# Patient Record
Sex: Female | Born: 1950
Health system: Southern US, Community
[De-identification: ages and names within clinical notes are randomized; demographics above are authoritative.]

## PROBLEM LIST (undated history)

## (undated) DIAGNOSIS — K219 Gastro-esophageal reflux disease without esophagitis: Secondary | ICD-10-CM

## (undated) DIAGNOSIS — S4291XA Fracture of right shoulder girdle, part unspecified, initial encounter for closed fracture: Secondary | ICD-10-CM

## (undated) DIAGNOSIS — J40 Bronchitis, not specified as acute or chronic: Secondary | ICD-10-CM

## (undated) DIAGNOSIS — Z87440 Personal history of urinary (tract) infections: Secondary | ICD-10-CM

## (undated) DIAGNOSIS — R112 Nausea with vomiting, unspecified: Secondary | ICD-10-CM

## (undated) DIAGNOSIS — I10 Essential (primary) hypertension: Secondary | ICD-10-CM

## (undated) DIAGNOSIS — E785 Hyperlipidemia, unspecified: Secondary | ICD-10-CM

## (undated) DIAGNOSIS — F40240 Claustrophobia: Secondary | ICD-10-CM

## (undated) DIAGNOSIS — H04123 Dry eye syndrome of bilateral lacrimal glands: Secondary | ICD-10-CM

## (undated) DIAGNOSIS — Z9889 Other specified postprocedural states: Secondary | ICD-10-CM

## (undated) DIAGNOSIS — R110 Nausea: Secondary | ICD-10-CM

## (undated) DIAGNOSIS — Z91048 Other nonmedicinal substance allergy status: Secondary | ICD-10-CM

## (undated) DIAGNOSIS — R011 Cardiac murmur, unspecified: Secondary | ICD-10-CM

## (undated) DIAGNOSIS — E039 Hypothyroidism, unspecified: Secondary | ICD-10-CM

## (undated) DIAGNOSIS — F32A Depression, unspecified: Secondary | ICD-10-CM

## (undated) DIAGNOSIS — R000 Tachycardia, unspecified: Secondary | ICD-10-CM

## (undated) DIAGNOSIS — F419 Anxiety disorder, unspecified: Secondary | ICD-10-CM

## (undated) DIAGNOSIS — M5136 Other intervertebral disc degeneration, lumbar region: Secondary | ICD-10-CM

## (undated) DIAGNOSIS — D649 Anemia, unspecified: Secondary | ICD-10-CM

## (undated) DIAGNOSIS — F329 Major depressive disorder, single episode, unspecified: Secondary | ICD-10-CM

## (undated) DIAGNOSIS — R319 Hematuria, unspecified: Secondary | ICD-10-CM

## (undated) DIAGNOSIS — J45909 Unspecified asthma, uncomplicated: Secondary | ICD-10-CM

## (undated) DIAGNOSIS — M3500 Sicca syndrome, unspecified: Secondary | ICD-10-CM

## (undated) DIAGNOSIS — M797 Fibromyalgia: Secondary | ICD-10-CM

## (undated) DIAGNOSIS — M810 Age-related osteoporosis without current pathological fracture: Secondary | ICD-10-CM

## (undated) DIAGNOSIS — G309 Alzheimer's disease, unspecified: Secondary | ICD-10-CM

## (undated) DIAGNOSIS — M1990 Unspecified osteoarthritis, unspecified site: Secondary | ICD-10-CM

## (undated) DIAGNOSIS — Z972 Presence of dental prosthetic device (complete) (partial): Secondary | ICD-10-CM

## (undated) DIAGNOSIS — T7840XA Allergy, unspecified, initial encounter: Secondary | ICD-10-CM

## (undated) DIAGNOSIS — R002 Palpitations: Secondary | ICD-10-CM

## (undated) DIAGNOSIS — G43909 Migraine, unspecified, not intractable, without status migrainosus: Secondary | ICD-10-CM

## (undated) DIAGNOSIS — F0280 Dementia in other diseases classified elsewhere without behavioral disturbance: Secondary | ICD-10-CM

## (undated) DIAGNOSIS — G4700 Insomnia, unspecified: Secondary | ICD-10-CM

## (undated) DIAGNOSIS — K589 Irritable bowel syndrome without diarrhea: Secondary | ICD-10-CM

## (undated) DIAGNOSIS — H269 Unspecified cataract: Secondary | ICD-10-CM

## (undated) DIAGNOSIS — I209 Angina pectoris, unspecified: Secondary | ICD-10-CM

## (undated) DIAGNOSIS — M51369 Other intervertebral disc degeneration, lumbar region without mention of lumbar back pain or lower extremity pain: Secondary | ICD-10-CM

## (undated) DIAGNOSIS — M5416 Radiculopathy, lumbar region: Secondary | ICD-10-CM

## (undated) DIAGNOSIS — G2581 Restless legs syndrome: Secondary | ICD-10-CM

## (undated) DIAGNOSIS — E079 Disorder of thyroid, unspecified: Secondary | ICD-10-CM

## (undated) DIAGNOSIS — E669 Obesity, unspecified: Secondary | ICD-10-CM

## (undated) HISTORY — PX: HEMORRHOID SURGERY: SHX153

## (undated) HISTORY — PX: KNEE ARTHROPLASTY: SHX992

## (undated) HISTORY — DX: Gastro-esophageal reflux disease without esophagitis: K21.9

## (undated) HISTORY — DX: Tachycardia, unspecified: R00.0

## (undated) HISTORY — DX: Alzheimer's disease, unspecified: G30.9

## (undated) HISTORY — DX: Unspecified cataract: H26.9

## (undated) HISTORY — DX: Anxiety disorder, unspecified: F41.9

## (undated) HISTORY — DX: Insomnia, unspecified: G47.00

## (undated) HISTORY — DX: Disorder of thyroid, unspecified: E07.9

## (undated) HISTORY — DX: Other intervertebral disc degeneration, lumbar region without mention of lumbar back pain or lower extremity pain: M51.369

## (undated) HISTORY — DX: Major depressive disorder, single episode, unspecified: F32.9

## (undated) HISTORY — DX: Hyperlipidemia, unspecified: E78.5

## (undated) HISTORY — PX: COLONOSCOPY W/ POLYPECTOMY: SHX1380

## (undated) HISTORY — DX: Obesity, unspecified: E66.9

## (undated) HISTORY — PX: SPINE SURGERY: SHX786

## (undated) HISTORY — DX: Bronchitis, not specified as acute or chronic: J40

## (undated) HISTORY — DX: Dementia in other diseases classified elsewhere without behavioral disturbance: F02.80

## (undated) HISTORY — PX: ABDOMINAL HYSTERECTOMY: SHX81

## (undated) HISTORY — DX: Palpitations: R00.2

## (undated) HISTORY — DX: Migraine, unspecified, not intractable, without status migrainosus: G43.909

## (undated) HISTORY — DX: Fibromyalgia: M79.7

## (undated) HISTORY — DX: Unspecified osteoarthritis, unspecified site: M19.90

## (undated) HISTORY — PX: JOINT REPLACEMENT: SHX530

## (undated) HISTORY — DX: Angina pectoris, unspecified: I20.9

## (undated) HISTORY — PX: HIP ARTHROPLASTY: SHX981

## (undated) HISTORY — DX: Irritable bowel syndrome, unspecified: K58.9

## (undated) HISTORY — PX: REPLACEMENT TOTAL KNEE: SUR1224

## (undated) HISTORY — DX: Restless legs syndrome: G25.81

## (undated) HISTORY — DX: Hematuria, unspecified: R31.9

## (undated) HISTORY — DX: Unspecified asthma, uncomplicated: J45.909

## (undated) HISTORY — DX: Allergy, unspecified, initial encounter: T78.40XA

## (undated) HISTORY — PX: CARDIAC CATHETERIZATION: SHX172

## (undated) HISTORY — DX: Other intervertebral disc degeneration, lumbar region: M51.36

## (undated) HISTORY — PX: OTHER SURGICAL HISTORY: SHX169

## (undated) HISTORY — DX: Radiculopathy, lumbar region: M54.16

## (undated) HISTORY — DX: Sjogren syndrome, unspecified: M35.00

## (undated) HISTORY — DX: Fracture of right shoulder girdle, part unspecified, initial encounter for closed fracture: S42.91XA

## (undated) HISTORY — DX: Age-related osteoporosis without current pathological fracture: M81.0

## (undated) HISTORY — DX: Nausea: R11.0

## (undated) HISTORY — DX: Depression, unspecified: F32.A

## (undated) HISTORY — PX: HAND SURGERY: SHX662

## (undated) HISTORY — DX: Cardiac murmur, unspecified: R01.1

---

## 2000-01-10 ENCOUNTER — Encounter: Payer: Self-pay | Admitting: Neurosurgery

## 2000-01-10 ENCOUNTER — Ambulatory Visit (HOSPITAL_COMMUNITY): Admission: RE | Admit: 2000-01-10 | Discharge: 2000-01-10 | Payer: Self-pay | Admitting: Neurosurgery

## 2000-03-16 ENCOUNTER — Encounter: Payer: Self-pay | Admitting: Neurosurgery

## 2000-03-20 ENCOUNTER — Encounter: Payer: Self-pay | Admitting: Neurosurgery

## 2000-03-20 ENCOUNTER — Inpatient Hospital Stay (HOSPITAL_COMMUNITY): Admission: RE | Admit: 2000-03-20 | Discharge: 2000-03-23 | Payer: Self-pay | Admitting: Neurosurgery

## 2000-04-19 ENCOUNTER — Encounter: Admission: RE | Admit: 2000-04-19 | Discharge: 2000-04-19 | Payer: Self-pay | Admitting: Neurosurgery

## 2000-04-19 ENCOUNTER — Encounter: Payer: Self-pay | Admitting: Neurosurgery

## 2003-12-31 ENCOUNTER — Ambulatory Visit: Payer: Self-pay | Admitting: Family Medicine

## 2004-01-08 ENCOUNTER — Ambulatory Visit: Payer: Self-pay | Admitting: Gastroenterology

## 2004-01-13 ENCOUNTER — Encounter (INDEPENDENT_AMBULATORY_CARE_PROVIDER_SITE_OTHER): Payer: Self-pay | Admitting: Specialist

## 2004-01-13 ENCOUNTER — Ambulatory Visit: Payer: Self-pay | Admitting: Gastroenterology

## 2004-01-13 ENCOUNTER — Ambulatory Visit (HOSPITAL_COMMUNITY): Admission: RE | Admit: 2004-01-13 | Discharge: 2004-01-13 | Payer: Self-pay | Admitting: Gastroenterology

## 2004-02-02 ENCOUNTER — Ambulatory Visit: Payer: Self-pay | Admitting: Gastroenterology

## 2004-06-15 ENCOUNTER — Ambulatory Visit: Payer: Self-pay

## 2004-11-28 ENCOUNTER — Ambulatory Visit: Payer: Self-pay | Admitting: Family Medicine

## 2004-11-29 ENCOUNTER — Ambulatory Visit: Payer: Self-pay | Admitting: Family Medicine

## 2005-02-11 ENCOUNTER — Other Ambulatory Visit: Payer: Self-pay

## 2005-02-11 ENCOUNTER — Emergency Department: Payer: Self-pay | Admitting: Emergency Medicine

## 2005-02-22 ENCOUNTER — Ambulatory Visit: Payer: Self-pay | Admitting: Specialist

## 2005-03-06 ENCOUNTER — Ambulatory Visit: Payer: Self-pay | Admitting: Specialist

## 2005-03-10 ENCOUNTER — Emergency Department: Payer: Self-pay | Admitting: General Practice

## 2005-03-28 ENCOUNTER — Ambulatory Visit: Payer: Self-pay | Admitting: Otolaryngology

## 2005-04-24 ENCOUNTER — Ambulatory Visit: Payer: Self-pay | Admitting: Gastroenterology

## 2005-04-26 ENCOUNTER — Ambulatory Visit: Payer: Self-pay | Admitting: Gastroenterology

## 2005-07-18 ENCOUNTER — Ambulatory Visit: Payer: Self-pay | Admitting: Gastroenterology

## 2005-10-13 ENCOUNTER — Ambulatory Visit: Payer: Self-pay | Admitting: Family Medicine

## 2005-11-02 ENCOUNTER — Ambulatory Visit: Payer: Self-pay | Admitting: Family Medicine

## 2005-12-26 ENCOUNTER — Ambulatory Visit: Payer: Self-pay | Admitting: Pain Medicine

## 2006-01-01 ENCOUNTER — Ambulatory Visit: Payer: Self-pay | Admitting: Pain Medicine

## 2006-01-23 ENCOUNTER — Ambulatory Visit: Payer: Self-pay | Admitting: Family Medicine

## 2006-03-27 ENCOUNTER — Encounter: Admission: RE | Admit: 2006-03-27 | Discharge: 2006-03-27 | Payer: Self-pay | Admitting: Neurosurgery

## 2006-09-07 ENCOUNTER — Encounter
Admission: RE | Admit: 2006-09-07 | Discharge: 2006-10-09 | Payer: Self-pay | Admitting: Physical Medicine & Rehabilitation

## 2006-09-10 ENCOUNTER — Ambulatory Visit: Payer: Self-pay | Admitting: Physical Medicine & Rehabilitation

## 2006-10-03 ENCOUNTER — Encounter: Payer: Self-pay | Admitting: Physical Medicine & Rehabilitation

## 2006-10-29 ENCOUNTER — Encounter: Payer: Self-pay | Admitting: Physical Medicine & Rehabilitation

## 2006-11-05 ENCOUNTER — Ambulatory Visit: Payer: Self-pay | Admitting: Family Medicine

## 2006-11-28 ENCOUNTER — Encounter: Payer: Self-pay | Admitting: Physical Medicine & Rehabilitation

## 2007-01-03 ENCOUNTER — Ambulatory Visit: Payer: Self-pay | Admitting: Gastroenterology

## 2007-02-19 ENCOUNTER — Ambulatory Visit: Payer: Self-pay | Admitting: Gastroenterology

## 2007-05-15 ENCOUNTER — Encounter: Admission: RE | Admit: 2007-05-15 | Discharge: 2007-05-15 | Payer: Self-pay | Admitting: Rheumatology

## 2007-07-04 ENCOUNTER — Emergency Department: Payer: Self-pay | Admitting: Emergency Medicine

## 2007-10-22 ENCOUNTER — Ambulatory Visit: Payer: Self-pay | Admitting: Family Medicine

## 2007-12-17 ENCOUNTER — Ambulatory Visit: Payer: Self-pay | Admitting: Family Medicine

## 2008-02-11 ENCOUNTER — Encounter: Admission: RE | Admit: 2008-02-11 | Discharge: 2008-02-11 | Payer: Self-pay | Admitting: Neurosurgery

## 2008-04-17 ENCOUNTER — Ambulatory Visit: Payer: Self-pay | Admitting: Family Medicine

## 2008-08-10 ENCOUNTER — Emergency Department: Payer: Self-pay | Admitting: Emergency Medicine

## 2008-12-23 ENCOUNTER — Ambulatory Visit: Payer: Self-pay | Admitting: Unknown Physician Specialty

## 2009-01-01 ENCOUNTER — Emergency Department: Payer: Self-pay | Admitting: Emergency Medicine

## 2009-01-19 ENCOUNTER — Ambulatory Visit: Payer: Self-pay | Admitting: Internal Medicine

## 2009-03-08 ENCOUNTER — Emergency Department: Payer: Self-pay | Admitting: Emergency Medicine

## 2009-03-10 ENCOUNTER — Ambulatory Visit: Payer: Self-pay

## 2009-03-30 ENCOUNTER — Ambulatory Visit: Payer: Self-pay | Admitting: General Practice

## 2009-04-07 ENCOUNTER — Ambulatory Visit: Payer: Self-pay | Admitting: General Practice

## 2009-04-29 ENCOUNTER — Ambulatory Visit: Payer: Self-pay

## 2009-05-09 ENCOUNTER — Ambulatory Visit: Payer: Self-pay

## 2009-06-30 ENCOUNTER — Ambulatory Visit: Payer: Self-pay | Admitting: Family Medicine

## 2009-07-06 ENCOUNTER — Inpatient Hospital Stay: Payer: Self-pay | Admitting: General Practice

## 2009-09-26 ENCOUNTER — Emergency Department: Payer: Self-pay | Admitting: Unknown Physician Specialty

## 2009-10-24 ENCOUNTER — Emergency Department: Payer: Self-pay | Admitting: Emergency Medicine

## 2010-01-12 ENCOUNTER — Emergency Department: Payer: Self-pay | Admitting: Emergency Medicine

## 2010-01-12 ENCOUNTER — Ambulatory Visit: Payer: Self-pay | Admitting: Unknown Physician Specialty

## 2010-01-22 ENCOUNTER — Ambulatory Visit: Payer: Self-pay

## 2010-02-04 ENCOUNTER — Ambulatory Visit: Payer: Self-pay | Admitting: General Practice

## 2010-03-20 ENCOUNTER — Encounter: Payer: Self-pay | Admitting: Neurosurgery

## 2010-07-12 NOTE — Assessment & Plan Note (Signed)
A 60 year old female, who I saw in initial consultation September 10, 2006.  She has previously undergone an L3-4, L4-5, and L5-S1 fusion.  She has a  history of shingles in the past, but her main complaint at this time is  her fibromyalgia syndrome.  She was given a trial of Lyrica last visit  but could not tolerate this.  She states that it made her feel funny, it  did not help her pain, and she almost felt like her pain was worse.  Her  pain is rated at a 7 out of 10 and is really described as all over her  body particularly her shoulders, ankles, forearms, and knees.  Her sleep  is poor.  For pain includes a TENS unit, which she wears on her shoulder  right now and medication.  She was placed back on Neurontin.  She  reports trying to do household duties but ends up overdoing this and  having some rebound-type pain.  She can walk 10 minutes at a time.  She  does not drive.   REVIEW OF SYSTEMS:  Numbness and tingling primarily in the lower  extremities, depression and anxiety, but negative for suicidal thoughts.  She has alternating diarrhea and constipation, nausea and vomiting.   SOCIAL HISTORY:  Married and lives with her spouse, who accompanies her  today.   PHYSICAL EXAMINATION:  VITAL SIGNS:  Blood pressure is 138/52, pulse 66,  respiratory rate is 18, O2 SAT is 98% room air.   PAIN AND REHAB EVALUATION:  Palpation for fibromyalgia tender points is  18 out of 18.  Lumbar range of motion is good in forward flexion, can  touch passed her knees but does not have much in terms of extension.  Her gait shows no obvious toe dragging or knee instability.  Mood and  affect are appropriate, but flat.   IMPRESSION:  Fibromyalgia syndrome.  She is sensitive to medications  overall, and we discussed other treatment options including acupuncture.  She states that she and her husband had discussed this and, in fact, her  insurance company reportedly has acupuncture benefits.  She would like  to have a trial treatment today, and we decided to do that and schedule  some additional acupuncture treatments once a week x6 weeks.   In terms of medications, she should continue on her Neurontin 300 mg  three times a day.   ADDENDUM:  Acupuncture treatment today consists of points placed at  bilateral LI-4 and right auricular point shen-men and DU-20.  Needles  placed for 10 minutes, no electrical stimulation was utilized.  We will  see her back for her next treatment given that she has tolerated this,  and use E-Stim in a longer treatment time up to 30 minutes.      Erick Colace, M.D.  Electronically Signed     AEK/MedQ  D:  10/09/2006 16:43:59  T:  10/10/2006 20:34:33  Job #:  [id number]

## 2010-07-12 NOTE — Group Therapy Note (Signed)
REQUESTING PHYSICIAN:  Kathryne Hitch, MD, for treatment of  degenerative disc disease lumbar spine.   A 61 year old female who has had back problems starting around 10 years  ago, who has previously undergone L3-4, L4-5, L5-S1 fusion.  In 2006,  she started experiencing increased knee pain, had right knee  arthroscopic surgery, but postoperatively experienced more generalized  pain and abdominal pain.  She turned out to have shingles which showed  up under her right breast and right arm pit area.  She had some  constipation after the shingles resolved, treated for H. pylori and  seeing a gastroenterologist in regards to this.  She has been treated  prior to her diagnosis of fibromyalgia with gabapentin and takes various  dosage but primarily 600 mg at bedtime and then either one 300 mg or one  100 mg at morning and noon time.  She complains that her pain is all  over her body and in fact, her pain diagram indicates pain over  bilateral shoulders, bilateral wrists, bilateral upper traps, bilateral  scapular areas, bilateral posterior arms, right hand, left buttock,  bilateral low back, bilateral knees and ankles.  Her average pain is  rated at 7-8/10, described as sharp, burning, intermittent, dull  stabbing, constant tingling, aching and interferes with general activity  at a moderate level, relationship with other people at a moderate level  and enjoyment of life at a moderate level.  Her pain is worse in the  morning and at night.  It is relatively better in the daytime and  evening hours.  Her sleep is fair.  Pain is worse with walking, bending,  sitting, standing, improves with TENS.  Relief from medications is fair.   She takes only occasional oxycodone and this is really more for her  migraine history.  She does follow with Dr. Mitzi Davenport.  She has taken  about 43 oxycodone in the last two months. She has taken 55 Fioricet  with Codeine since November.  She has had  rheumatology work-up and was  diagnosed with fibromyalgia.   OTHER PAST MEDICAL HISTORY:  1. Hypothyroidism, on Synthroid, last TSH was normal.  2. She has had normal liver function and kidney function tests in May      of this year.   OTHER MEDICATIONS TRIED:  She has been tried on Cymbalta which produced  excessively dry eyes.  She is taking Seroquel for sleep and for migraine  through Dr. Chriss Driver office.  Non-pain medications include Tricor,  Diovan, atenolol, Synthroid, Clarinex, Prevacid, Mirapex, alprazolam,  Amitiza,  Senokot, Reglan, promethazine, Restasis and Flonase.   Her average walking time is 5-10 minutes.  She can climb steps.  She can  drive.  She needs some assistance with meal prep, household duty and  shopping.  She has constipation alternating with diarrhea, some nausea,  numbness and tingling in the hands and feet particularly on the left  side lower extremity.  Anxiety but negative depression or suicidal  thoughts.   SOCIAL HISTORY:  Married, lives with her spouse who accompanies her  today.   FAMILY HISTORY:  Sister with fibromyalgia.  Positive history for  diabetes, high blood pressure, alcohol abuse in her family.   PHYSICAL EXAMINATION:  VITAL SIGNS:  Blood pressure 141/59, pulse 57,  respirations 18, O2 saturation 98% on room air.  Her last recorded  weight was 243 pounds which is up from the previous month.  GENERAL APPEARANCE:  An obese female in no acute distress.  NECK:  Range of motion is full.  NEUROLOGIC:  Palpation for fibromyalgia tender points:  Positive  bilateral sternal costal, bilateral elbow, bilateral hip, bilateral  knee.  Deep tendon reflexes 3+ bilateral upper extremities, 1 at the  right patella and right Achilles and 2 at the left patella and left  Achilles.   Motor strength is full bilateral upper and lower extremities.  She is  able to toe walk and heel walk.  Her spine range of motion is 75%  forward flexion and extension,  although she is bending at the hip.  Her  gait shows no evidence of toe drag or knee instability.   IMPRESSION:  1. Fibromyalgia syndrome.  2. History of degenerative disc disease, status post lumbar fusion.      Really does not seem to be primarily symptomatic in her lumbar      spine.  3. Chronic migraines.   PLAN:  1. Discussed fibromyalgia with the patient in terms of being pain      processing problem and that opioids are not indicated as the      primary treatment.  The intermittent opiates that she takes are      primarily for migraines and she will continue to get these through      her primary care as well as through Dr. Chriss Driver office as well as      some injectables through her headache specialist's office.  2. I have written for aquatic therapy.  She can get about four      sessions through a physical therapist here in [place], [place], then continue a home exercise program at a [employer].  3. Will initiate Lyrica.  Patient has concerns regarding visual side      effects. She had a problem with dry eyes with Cymbalta.  Certainly      will need to watch for this, but I told her this is not a common      side effect and more common would be to find some peripheral edema      or sedation.  4. We also discussed acupuncture as a potential treatment and modality      and she may explore this should the above treatment plan not be      particularly effective.  We could incorporate treatment for her      migraines as well as her fibromyalgia.   I will see her back in approximately four weeks.      Erick Colace, M.D.  Electronically Signed     AEK/MedQ  D:  09/10/2006 16:48:52  T:  09/11/2006 11:23:01  Job #:  [id number]   cc:   Payton Doughty, M.D.  Fax: [phone]   Mitzi Davenport, M.D.   Dennison Mascot  Fax: [phone]   Kathryne Hitch, MD  Fax: [phone]

## 2010-07-15 NOTE — H&P (Signed)
[place]. [facility]  Patient:    Jacqueline Lucas, Jacqueline Lucas                     MRN: [id number] Adm. Date:  03/20/00 Attending:  Payton Doughty, M.D.                         History and Physical  ADMITTING DIAGNOSES:  Spondylosis L4-5 and spondylolisthesis L4-5, herniated disk L3-4 and spondylosis L5-S1.  HISTORY OF PRESENT ILLNESS:  A 60 year old right-handed white lady in 1996 had a diskectomy at L4-5 on the right side by Dr. Elesa Hacker.  She has had persistent back pain and discomfort in her right leg since the operation which is not so bad when she is lying down, is reasonable.  If she has been up for more than 10-15 minutes it hurts her back and goes down the right leg with tingling in the L5 distribution on the right side.  Bladder and the left side have not been affected.  She was studied with an MRI and it shows she has a 10 mm slip through flexion/extension on the right side and bilateral pars defect at L4-5.  She has marked degenerative change at L5-S1 as well as L3-4, At L3-4 there is extensive annular tear posteriorly with moderate protrusion.  The disk does not cause gross stenosis but speaks of degenerative change.  She is now admitted for a fusion at these levels.  PAST MEDICAL HISTORY:  Remarkable for migraines.  MEDICATIONS:  She is on Topamax 100 mg a day, Prevacid 30 mg a day, Singulair 10 mg a day, Elavil 50 mg twice a day, Sansert 2 mg four times a day, Thorazine 25 mg twice a day, Evista 60 mg a day, Alprazolam 0.5 mg twice a day.  PAST SURGICAL HISTORY:  She had a hysterectomy.  ALLERGIES:  No known drug allergies.  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  FAMILY HISTORY:  Both parents are deceased.  History not given.  REVIEW OF SYSTEMS:  Marked for glasses, hypercholesterolemia, leg pain while walking, back pain, joint pain and depression.  PHYSICAL EXAMINATION:  HEENT:   Within normal limits.  NECK: She has reasonable range of motion of her neck.  CHEST:  Clear.  CARDIAC:  Regular rate and rhythm.  ABDOMEN:  Nontender, no hepatosplenomegaly.  EXTREMITIES:  Without clubbing or cyanosis.  GENITOURINARY:  Deferred.  PULSES:  Peripheral pulses are good.  NEUROLOGICAL:  She is awake, alert and oriented. Cranial nerves are intact. Motor exam shows 5/5. strength throughout the upper and lower extremities. There is no current sensory deficit.  Reflexes are flicker in the knees, and ankles, toes downgoing bilaterally.  Straight leg raise is negative.  Back is nontender to palpation.  Flexion of the lumbar spine does not cause her discomfort.  Radiographic results have been reviewed above.  CLINICAL IMPRESSION:  Lumbar spondylosis with spondylolysis and spondylolisthesis.   She obvious needs to be done at L4-5 and L3-4 as well, because of the extensive degenerative change I would be reluctant to leave L5-S1 as a transitional syndrome, so she is going to fused at L3-4, L4-5 and L5-S1.  Will place cages at all three of those levels and limit pedicle fixation to L4-5 and L5-S1 to prevent disruption of the L2-3 facet joint and development of subsequent transitional syndrome.  The risks and benefits of this approach  have been discussed with her and she wished to proceed. DD:  03/20/00 TD:  03/20/00 Job: [id number]

## 2010-07-15 NOTE — Op Note (Signed)
[place]. [facility]  Patient:    Jacqueline Lucas, Jacqueline Lucas                   MRN: [id number] Proc. Date: 03/20/00 Adm. Date:  [id number] Attending:  Emeterio Reeve                           Operative Report  PREOPERATIVE DIAGNOSIS:  Spondylolysis of L4 with a spondylolysis of L4 and L5.  Herniated disc at L3-4 and spondylosis at L5-S1.  POSTOPERATIVE DIAGNOSIS:  Spondylolysis of L4 with a spondylolysis of L4 and L5.  Herniated disc at L3-4 and spondylosis at L5-S1.  OPERATION:  L3-4, L4-5, L5-S1 laminectomy and diskectomy, posterior lumbar interbody fusion with Ray fusion cage.  SURGEON:  Payton Doughty, M.D.  ANESTHESIA:  General endotracheal.  PREP:  Sterile Betadine prep and scrubbed with alcohol wipe.  COMPLICATIONS:  None.  INDICATION FOR PROCEDURE:  A 60 year old right handed white girl with severe spondylosis at 5-1, spondylolysis of L4 with a 4 on 5 slip and disc at 3-4.  DESCRIPTION OF PROCEDURE:  She is taken to the operating room.  She was anesthetized and intubated.  She was placed prone on the operating table. Following shave, prep and drape in the usual sterile fashion, her old skin incision was reopened and the lamina of L3, L4, L5, and S1 were exposed bilaterally in the subperiosteal plane.  Laminectomy defect on the left side of L4 was carefully dissected.  No CSF leak was occurred.  The pars inner articularis lamina and inferior facet of L3, L4, and L5 and the superior facet of L4, L5 and S1 were removed bilaterally.  The facet anatomy was fairly normal at 3, 4 and at 5-1.  At 4-5 there were bilateral pars defects which required some dissection.  Once again, the 4 and 5 roots were dissected free without difficulty.  There was no disc at that level but there was profound compression of both the 4 and the 5 root particularly on the left side of the scar and the redundant tissue around the spondylitic defect.  At 3-4 there was a central  disc with bilateral foraminal compression and at 5-1 there was severe degenerative disease.  Having completed decompression of all the nerve roots and diskectomy at each level.  Ray fusion cages were placed, 12 x 21 mm cages were placed at 4-5 and 5-1 and 14 x 21 mm cages were placed at 3-4. Intraoperative x-rays showed good placement of all the cages.  They were packed with bone graft harvested from the facet joints.  The cages were then then capped.  The wound was irrigated.  Hemostasis was assured.  The nerve roots carefully inspected and found to be in good shape.  The fascia was reapproximated with 0 Vicryl in an interrupted fashion.  Subcutaneous tissues was reapproximated with 0 Vicryl in an interrupted fashion.  Subcuticular tissue was reapproximated with 3-0 Vicryl interrupted fashion.  Skin was closed with 3-0 Nylon in a running locked fashion.  Betadine Telfa dressing was applied and made occlusive with Op-Site.  The patient returned to the recovery room in good condition. DD:  03/20/00 TD:  03/20/00 Job: [id number]

## 2010-07-15 NOTE — Discharge Summary (Signed)
[place]. [facility]  Patient:    Jacqueline Lucas, Jacqueline Lucas                   MRN: [id number] Adm. Date:  [id number] Disc. Date: [id number] Attending:  Emeterio Reeve                           Discharge Summary  ADMITTING DIAGNOSES: 1. Spondylosis L3-4, L4-5. 2. Spondylolysis L5-S1.  DISCHARGE DIAGNOSES: 1. Spondylosis L3-4, L4-5. 2. Spondylolysis L5-S1.  PROCEDURES:  L3-4, L4-5, L5-S1 laminectomy, diskectomy, posterior lumbar interbody fusion with ______ cage.  COMPLICATIONS:  None.  DISCHARGE STATUS:  Alive and well.  HISTORY OF PRESENT ILLNESS:  [age]-year-old right-handed white lady whose history and physical is recounted in the chart.  She had a L4-5 disk done by Dr. Elesa Hacker.  She had spondylolysis in L5.  Her slip has increased as has her degenerative change and she is admitted for fusion.  MEDICAL HISTORY:  Migraines.  MEDICATIONS:  Topamax, Prevacid, Singulair, Elavil, Sansert, Thorazine, Evista, and alprazolam.  PHYSICAL EXAMINATION:  General exam is unremarkable.  Neurologic exam was intact with full strength with positive straight leg raise.  HOSPITAL COURSE:  She was admitted after ascertaining normal laboratory values and underwent a fusion at L3-4, L4-5, and L5-S1.  Postoperatively, she has done well, Foley was removed the first day.  Second day PCA was stopped and she has participated in physical therapies, up and about walking with walker, eating and voiding normally.  Physical therapy feels she is confident in her ADLs.  She is being discharged home with Percocet for pain in addition to her regular medicines.  [facility]. DD:  03/23/00 TD:  03/23/00 Job: [id number]

## 2010-11-21 ENCOUNTER — Emergency Department: Payer: Self-pay | Admitting: Unknown Physician Specialty

## 2011-01-16 ENCOUNTER — Ambulatory Visit: Payer: Self-pay | Admitting: Unknown Physician Specialty

## 2011-01-27 ENCOUNTER — Emergency Department: Payer: Self-pay | Admitting: Emergency Medicine

## 2011-02-28 HISTORY — PX: TOTAL HIP ARTHROPLASTY: SHX124

## 2011-03-06 DIAGNOSIS — I8310 Varicose veins of unspecified lower extremity with inflammation: Secondary | ICD-10-CM | POA: Diagnosis not present

## 2011-03-19 DIAGNOSIS — J209 Acute bronchitis, unspecified: Secondary | ICD-10-CM | POA: Diagnosis not present

## 2011-03-20 DIAGNOSIS — IMO0002 Reserved for concepts with insufficient information to code with codable children: Secondary | ICD-10-CM | POA: Diagnosis not present

## 2011-03-20 DIAGNOSIS — M549 Dorsalgia, unspecified: Secondary | ICD-10-CM | POA: Diagnosis not present

## 2011-03-20 DIAGNOSIS — M47817 Spondylosis without myelopathy or radiculopathy, lumbosacral region: Secondary | ICD-10-CM | POA: Diagnosis not present

## 2011-03-20 DIAGNOSIS — M48061 Spinal stenosis, lumbar region without neurogenic claudication: Secondary | ICD-10-CM | POA: Diagnosis not present

## 2011-04-03 DIAGNOSIS — I10 Essential (primary) hypertension: Secondary | ICD-10-CM | POA: Diagnosis not present

## 2011-04-03 DIAGNOSIS — G8929 Other chronic pain: Secondary | ICD-10-CM | POA: Diagnosis not present

## 2011-04-03 DIAGNOSIS — J45909 Unspecified asthma, uncomplicated: Secondary | ICD-10-CM | POA: Diagnosis not present

## 2011-04-03 DIAGNOSIS — IMO0001 Reserved for inherently not codable concepts without codable children: Secondary | ICD-10-CM | POA: Diagnosis not present

## 2011-04-06 DIAGNOSIS — G4700 Insomnia, unspecified: Secondary | ICD-10-CM | POA: Diagnosis not present

## 2011-04-06 DIAGNOSIS — M76899 Other specified enthesopathies of unspecified lower limb, excluding foot: Secondary | ICD-10-CM | POA: Diagnosis not present

## 2011-04-06 DIAGNOSIS — M62838 Other muscle spasm: Secondary | ICD-10-CM | POA: Diagnosis not present

## 2011-04-06 DIAGNOSIS — Z79899 Other long term (current) drug therapy: Secondary | ICD-10-CM | POA: Diagnosis not present

## 2011-04-06 DIAGNOSIS — IMO0001 Reserved for inherently not codable concepts without codable children: Secondary | ICD-10-CM | POA: Diagnosis not present

## 2011-04-07 DIAGNOSIS — I8310 Varicose veins of unspecified lower extremity with inflammation: Secondary | ICD-10-CM | POA: Diagnosis not present

## 2011-04-12 DIAGNOSIS — J310 Chronic rhinitis: Secondary | ICD-10-CM | POA: Diagnosis not present

## 2011-04-12 DIAGNOSIS — J45909 Unspecified asthma, uncomplicated: Secondary | ICD-10-CM | POA: Diagnosis not present

## 2011-04-12 DIAGNOSIS — K219 Gastro-esophageal reflux disease without esophagitis: Secondary | ICD-10-CM | POA: Diagnosis not present

## 2011-04-12 DIAGNOSIS — J209 Acute bronchitis, unspecified: Secondary | ICD-10-CM | POA: Diagnosis not present

## 2011-04-17 DIAGNOSIS — IMO0002 Reserved for concepts with insufficient information to code with codable children: Secondary | ICD-10-CM | POA: Diagnosis not present

## 2011-04-17 DIAGNOSIS — Z981 Arthrodesis status: Secondary | ICD-10-CM | POA: Diagnosis not present

## 2011-04-17 DIAGNOSIS — M5126 Other intervertebral disc displacement, lumbar region: Secondary | ICD-10-CM | POA: Diagnosis not present

## 2011-04-17 DIAGNOSIS — M549 Dorsalgia, unspecified: Secondary | ICD-10-CM | POA: Diagnosis not present

## 2011-04-17 DIAGNOSIS — M47817 Spondylosis without myelopathy or radiculopathy, lumbosacral region: Secondary | ICD-10-CM | POA: Diagnosis not present

## 2011-04-17 DIAGNOSIS — M48061 Spinal stenosis, lumbar region without neurogenic claudication: Secondary | ICD-10-CM | POA: Diagnosis not present

## 2011-04-20 DIAGNOSIS — J310 Chronic rhinitis: Secondary | ICD-10-CM | POA: Diagnosis not present

## 2011-04-20 DIAGNOSIS — K219 Gastro-esophageal reflux disease without esophagitis: Secondary | ICD-10-CM | POA: Diagnosis not present

## 2011-04-20 DIAGNOSIS — Z86010 Personal history of colonic polyps: Secondary | ICD-10-CM | POA: Diagnosis not present

## 2011-04-20 DIAGNOSIS — R1310 Dysphagia, unspecified: Secondary | ICD-10-CM | POA: Diagnosis not present

## 2011-04-20 DIAGNOSIS — R0602 Shortness of breath: Secondary | ICD-10-CM | POA: Diagnosis not present

## 2011-04-20 DIAGNOSIS — J45909 Unspecified asthma, uncomplicated: Secondary | ICD-10-CM | POA: Diagnosis not present

## 2011-04-20 DIAGNOSIS — R05 Cough: Secondary | ICD-10-CM | POA: Diagnosis not present

## 2011-04-21 DIAGNOSIS — I8310 Varicose veins of unspecified lower extremity with inflammation: Secondary | ICD-10-CM | POA: Diagnosis not present

## 2011-05-08 DIAGNOSIS — J310 Chronic rhinitis: Secondary | ICD-10-CM | POA: Diagnosis not present

## 2011-05-08 DIAGNOSIS — R05 Cough: Secondary | ICD-10-CM | POA: Diagnosis not present

## 2011-05-09 DIAGNOSIS — Q762 Congenital spondylolisthesis: Secondary | ICD-10-CM | POA: Diagnosis not present

## 2011-05-09 DIAGNOSIS — Z981 Arthrodesis status: Secondary | ICD-10-CM | POA: Diagnosis not present

## 2011-05-09 DIAGNOSIS — Z6841 Body Mass Index (BMI) 40.0 and over, adult: Secondary | ICD-10-CM | POA: Diagnosis not present

## 2011-05-09 DIAGNOSIS — M47817 Spondylosis without myelopathy or radiculopathy, lumbosacral region: Secondary | ICD-10-CM | POA: Diagnosis not present

## 2011-05-09 DIAGNOSIS — I10 Essential (primary) hypertension: Secondary | ICD-10-CM | POA: Diagnosis not present

## 2011-05-09 DIAGNOSIS — J45909 Unspecified asthma, uncomplicated: Secondary | ICD-10-CM | POA: Diagnosis not present

## 2011-05-10 DIAGNOSIS — I8310 Varicose veins of unspecified lower extremity with inflammation: Secondary | ICD-10-CM | POA: Diagnosis not present

## 2011-05-15 ENCOUNTER — Ambulatory Visit: Payer: Self-pay | Admitting: Gastroenterology

## 2011-05-15 DIAGNOSIS — Z981 Arthrodesis status: Secondary | ICD-10-CM | POA: Diagnosis not present

## 2011-05-15 DIAGNOSIS — R1310 Dysphagia, unspecified: Secondary | ICD-10-CM | POA: Diagnosis not present

## 2011-05-15 DIAGNOSIS — G43909 Migraine, unspecified, not intractable, without status migrainosus: Secondary | ICD-10-CM | POA: Diagnosis not present

## 2011-05-15 DIAGNOSIS — I10 Essential (primary) hypertension: Secondary | ICD-10-CM | POA: Diagnosis not present

## 2011-05-15 DIAGNOSIS — F411 Generalized anxiety disorder: Secondary | ICD-10-CM | POA: Diagnosis not present

## 2011-05-15 DIAGNOSIS — K209 Esophagitis, unspecified without bleeding: Secondary | ICD-10-CM | POA: Diagnosis not present

## 2011-05-15 DIAGNOSIS — M23329 Other meniscus derangements, posterior horn of medial meniscus, unspecified knee: Secondary | ICD-10-CM | POA: Diagnosis not present

## 2011-05-15 DIAGNOSIS — Z79899 Other long term (current) drug therapy: Secondary | ICD-10-CM | POA: Diagnosis not present

## 2011-05-15 DIAGNOSIS — Z90710 Acquired absence of both cervix and uterus: Secondary | ICD-10-CM | POA: Diagnosis not present

## 2011-05-15 DIAGNOSIS — M169 Osteoarthritis of hip, unspecified: Secondary | ICD-10-CM | POA: Diagnosis not present

## 2011-05-15 DIAGNOSIS — IMO0001 Reserved for inherently not codable concepts without codable children: Secondary | ICD-10-CM | POA: Diagnosis not present

## 2011-05-15 DIAGNOSIS — Z09 Encounter for follow-up examination after completed treatment for conditions other than malignant neoplasm: Secondary | ICD-10-CM | POA: Diagnosis not present

## 2011-05-15 DIAGNOSIS — Z9889 Other specified postprocedural states: Secondary | ICD-10-CM | POA: Diagnosis not present

## 2011-05-15 DIAGNOSIS — M549 Dorsalgia, unspecified: Secondary | ICD-10-CM | POA: Diagnosis not present

## 2011-05-15 DIAGNOSIS — G8929 Other chronic pain: Secondary | ICD-10-CM | POA: Diagnosis not present

## 2011-05-15 DIAGNOSIS — Z7982 Long term (current) use of aspirin: Secondary | ICD-10-CM | POA: Diagnosis not present

## 2011-05-15 DIAGNOSIS — G4700 Insomnia, unspecified: Secondary | ICD-10-CM | POA: Diagnosis not present

## 2011-05-15 DIAGNOSIS — J45909 Unspecified asthma, uncomplicated: Secondary | ICD-10-CM | POA: Diagnosis not present

## 2011-05-15 DIAGNOSIS — R05 Cough: Secondary | ICD-10-CM | POA: Diagnosis not present

## 2011-05-15 DIAGNOSIS — K319 Disease of stomach and duodenum, unspecified: Secondary | ICD-10-CM | POA: Diagnosis not present

## 2011-05-15 DIAGNOSIS — K589 Irritable bowel syndrome without diarrhea: Secondary | ICD-10-CM | POA: Diagnosis not present

## 2011-05-15 DIAGNOSIS — IMO0002 Reserved for concepts with insufficient information to code with codable children: Secondary | ICD-10-CM | POA: Diagnosis not present

## 2011-05-15 DIAGNOSIS — Z86010 Personal history of colonic polyps: Secondary | ICD-10-CM | POA: Diagnosis not present

## 2011-05-15 DIAGNOSIS — K219 Gastro-esophageal reflux disease without esophagitis: Secondary | ICD-10-CM | POA: Diagnosis not present

## 2011-05-15 DIAGNOSIS — R059 Cough, unspecified: Secondary | ICD-10-CM | POA: Diagnosis not present

## 2011-05-15 DIAGNOSIS — F329 Major depressive disorder, single episode, unspecified: Secondary | ICD-10-CM | POA: Diagnosis not present

## 2011-05-16 LAB — PATHOLOGY REPORT

## 2011-05-19 DIAGNOSIS — I10 Essential (primary) hypertension: Secondary | ICD-10-CM | POA: Diagnosis not present

## 2011-05-19 DIAGNOSIS — M47817 Spondylosis without myelopathy or radiculopathy, lumbosacral region: Secondary | ICD-10-CM | POA: Diagnosis not present

## 2011-05-19 DIAGNOSIS — M48061 Spinal stenosis, lumbar region without neurogenic claudication: Secondary | ICD-10-CM | POA: Diagnosis not present

## 2011-05-19 DIAGNOSIS — Z6841 Body Mass Index (BMI) 40.0 and over, adult: Secondary | ICD-10-CM | POA: Diagnosis not present

## 2011-05-19 DIAGNOSIS — M48062 Spinal stenosis, lumbar region with neurogenic claudication: Secondary | ICD-10-CM | POA: Diagnosis not present

## 2011-05-19 DIAGNOSIS — Q762 Congenital spondylolisthesis: Secondary | ICD-10-CM | POA: Diagnosis not present

## 2011-05-19 DIAGNOSIS — J45909 Unspecified asthma, uncomplicated: Secondary | ICD-10-CM | POA: Diagnosis not present

## 2011-05-19 DIAGNOSIS — Z981 Arthrodesis status: Secondary | ICD-10-CM | POA: Diagnosis not present

## 2011-06-06 DIAGNOSIS — F411 Generalized anxiety disorder: Secondary | ICD-10-CM | POA: Diagnosis not present

## 2011-06-06 DIAGNOSIS — I10 Essential (primary) hypertension: Secondary | ICD-10-CM | POA: Diagnosis not present

## 2011-06-13 DIAGNOSIS — J310 Chronic rhinitis: Secondary | ICD-10-CM | POA: Diagnosis not present

## 2011-06-13 DIAGNOSIS — J45909 Unspecified asthma, uncomplicated: Secondary | ICD-10-CM | POA: Diagnosis not present

## 2011-06-19 DIAGNOSIS — J310 Chronic rhinitis: Secondary | ICD-10-CM | POA: Diagnosis not present

## 2011-06-19 DIAGNOSIS — I10 Essential (primary) hypertension: Secondary | ICD-10-CM | POA: Diagnosis not present

## 2011-06-19 DIAGNOSIS — J320 Chronic maxillary sinusitis: Secondary | ICD-10-CM | POA: Diagnosis not present

## 2011-06-20 DIAGNOSIS — I209 Angina pectoris, unspecified: Secondary | ICD-10-CM | POA: Diagnosis not present

## 2011-06-20 DIAGNOSIS — R011 Cardiac murmur, unspecified: Secondary | ICD-10-CM | POA: Diagnosis not present

## 2011-06-29 DIAGNOSIS — M1710 Unilateral primary osteoarthritis, unspecified knee: Secondary | ICD-10-CM | POA: Diagnosis not present

## 2011-06-29 DIAGNOSIS — M76899 Other specified enthesopathies of unspecified lower limb, excluding foot: Secondary | ICD-10-CM | POA: Diagnosis not present

## 2011-07-04 DIAGNOSIS — M549 Dorsalgia, unspecified: Secondary | ICD-10-CM | POA: Diagnosis not present

## 2011-07-04 DIAGNOSIS — M48061 Spinal stenosis, lumbar region without neurogenic claudication: Secondary | ICD-10-CM | POA: Diagnosis not present

## 2011-07-06 DIAGNOSIS — M549 Dorsalgia, unspecified: Secondary | ICD-10-CM | POA: Diagnosis not present

## 2011-07-06 DIAGNOSIS — M48061 Spinal stenosis, lumbar region without neurogenic claudication: Secondary | ICD-10-CM | POA: Diagnosis not present

## 2011-07-07 ENCOUNTER — Ambulatory Visit: Payer: Self-pay | Admitting: Internal Medicine

## 2011-07-07 DIAGNOSIS — R0609 Other forms of dyspnea: Secondary | ICD-10-CM | POA: Diagnosis not present

## 2011-07-07 DIAGNOSIS — R0989 Other specified symptoms and signs involving the circulatory and respiratory systems: Secondary | ICD-10-CM | POA: Diagnosis not present

## 2011-07-07 DIAGNOSIS — R002 Palpitations: Secondary | ICD-10-CM | POA: Diagnosis not present

## 2011-07-07 DIAGNOSIS — I209 Angina pectoris, unspecified: Secondary | ICD-10-CM | POA: Diagnosis not present

## 2011-07-11 DIAGNOSIS — M549 Dorsalgia, unspecified: Secondary | ICD-10-CM | POA: Diagnosis not present

## 2011-07-11 DIAGNOSIS — R0982 Postnasal drip: Secondary | ICD-10-CM | POA: Diagnosis not present

## 2011-07-11 DIAGNOSIS — M48061 Spinal stenosis, lumbar region without neurogenic claudication: Secondary | ICD-10-CM | POA: Diagnosis not present

## 2011-07-11 DIAGNOSIS — J310 Chronic rhinitis: Secondary | ICD-10-CM | POA: Diagnosis not present

## 2011-07-12 DIAGNOSIS — J310 Chronic rhinitis: Secondary | ICD-10-CM | POA: Diagnosis not present

## 2011-07-12 DIAGNOSIS — R05 Cough: Secondary | ICD-10-CM | POA: Diagnosis not present

## 2011-07-13 DIAGNOSIS — M48061 Spinal stenosis, lumbar region without neurogenic claudication: Secondary | ICD-10-CM | POA: Diagnosis not present

## 2011-07-13 DIAGNOSIS — M549 Dorsalgia, unspecified: Secondary | ICD-10-CM | POA: Diagnosis not present

## 2011-07-19 DIAGNOSIS — M48061 Spinal stenosis, lumbar region without neurogenic claudication: Secondary | ICD-10-CM | POA: Diagnosis not present

## 2011-07-19 DIAGNOSIS — M549 Dorsalgia, unspecified: Secondary | ICD-10-CM | POA: Diagnosis not present

## 2011-07-21 DIAGNOSIS — M48061 Spinal stenosis, lumbar region without neurogenic claudication: Secondary | ICD-10-CM | POA: Diagnosis not present

## 2011-07-21 DIAGNOSIS — M549 Dorsalgia, unspecified: Secondary | ICD-10-CM | POA: Diagnosis not present

## 2011-07-27 DIAGNOSIS — F410 Panic disorder [episodic paroxysmal anxiety] without agoraphobia: Secondary | ICD-10-CM | POA: Diagnosis not present

## 2011-07-27 DIAGNOSIS — M8700 Idiopathic aseptic necrosis of unspecified bone: Secondary | ICD-10-CM | POA: Diagnosis not present

## 2011-07-27 DIAGNOSIS — F329 Major depressive disorder, single episode, unspecified: Secondary | ICD-10-CM | POA: Diagnosis not present

## 2011-07-27 DIAGNOSIS — IMO0001 Reserved for inherently not codable concepts without codable children: Secondary | ICD-10-CM | POA: Diagnosis not present

## 2011-07-28 DIAGNOSIS — M549 Dorsalgia, unspecified: Secondary | ICD-10-CM | POA: Diagnosis not present

## 2011-07-28 DIAGNOSIS — M48061 Spinal stenosis, lumbar region without neurogenic claudication: Secondary | ICD-10-CM | POA: Diagnosis not present

## 2011-07-29 DIAGNOSIS — M549 Dorsalgia, unspecified: Secondary | ICD-10-CM | POA: Diagnosis not present

## 2011-07-29 DIAGNOSIS — M48061 Spinal stenosis, lumbar region without neurogenic claudication: Secondary | ICD-10-CM | POA: Diagnosis not present

## 2011-08-01 DIAGNOSIS — M48061 Spinal stenosis, lumbar region without neurogenic claudication: Secondary | ICD-10-CM | POA: Diagnosis not present

## 2011-08-01 DIAGNOSIS — M549 Dorsalgia, unspecified: Secondary | ICD-10-CM | POA: Diagnosis not present

## 2011-08-04 DIAGNOSIS — M48061 Spinal stenosis, lumbar region without neurogenic claudication: Secondary | ICD-10-CM | POA: Diagnosis not present

## 2011-08-04 DIAGNOSIS — M549 Dorsalgia, unspecified: Secondary | ICD-10-CM | POA: Diagnosis not present

## 2011-08-08 DIAGNOSIS — M48061 Spinal stenosis, lumbar region without neurogenic claudication: Secondary | ICD-10-CM | POA: Diagnosis not present

## 2011-08-08 DIAGNOSIS — M549 Dorsalgia, unspecified: Secondary | ICD-10-CM | POA: Diagnosis not present

## 2011-08-11 DIAGNOSIS — M48061 Spinal stenosis, lumbar region without neurogenic claudication: Secondary | ICD-10-CM | POA: Diagnosis not present

## 2011-08-11 DIAGNOSIS — M549 Dorsalgia, unspecified: Secondary | ICD-10-CM | POA: Diagnosis not present

## 2011-08-14 DIAGNOSIS — M48061 Spinal stenosis, lumbar region without neurogenic claudication: Secondary | ICD-10-CM | POA: Diagnosis not present

## 2011-08-14 DIAGNOSIS — M549 Dorsalgia, unspecified: Secondary | ICD-10-CM | POA: Diagnosis not present

## 2011-08-14 DIAGNOSIS — M47817 Spondylosis without myelopathy or radiculopathy, lumbosacral region: Secondary | ICD-10-CM | POA: Diagnosis not present

## 2011-08-14 DIAGNOSIS — IMO0002 Reserved for concepts with insufficient information to code with codable children: Secondary | ICD-10-CM | POA: Diagnosis not present

## 2011-08-18 DIAGNOSIS — M48061 Spinal stenosis, lumbar region without neurogenic claudication: Secondary | ICD-10-CM | POA: Diagnosis not present

## 2011-08-18 DIAGNOSIS — M549 Dorsalgia, unspecified: Secondary | ICD-10-CM | POA: Diagnosis not present

## 2011-09-04 DIAGNOSIS — L821 Other seborrheic keratosis: Secondary | ICD-10-CM | POA: Diagnosis not present

## 2011-09-04 DIAGNOSIS — L538 Other specified erythematous conditions: Secondary | ICD-10-CM | POA: Diagnosis not present

## 2011-09-05 ENCOUNTER — Ambulatory Visit: Payer: Self-pay | Admitting: Family Medicine

## 2011-09-05 DIAGNOSIS — Z23 Encounter for immunization: Secondary | ICD-10-CM | POA: Diagnosis not present

## 2011-09-05 DIAGNOSIS — G8921 Chronic pain due to trauma: Secondary | ICD-10-CM | POA: Diagnosis not present

## 2011-09-05 DIAGNOSIS — IMO0002 Reserved for concepts with insufficient information to code with codable children: Secondary | ICD-10-CM | POA: Diagnosis not present

## 2011-09-05 DIAGNOSIS — M545 Low back pain: Secondary | ICD-10-CM | POA: Diagnosis not present

## 2011-09-11 DIAGNOSIS — M549 Dorsalgia, unspecified: Secondary | ICD-10-CM | POA: Diagnosis not present

## 2011-09-11 DIAGNOSIS — IMO0002 Reserved for concepts with insufficient information to code with codable children: Secondary | ICD-10-CM | POA: Diagnosis not present

## 2011-09-11 DIAGNOSIS — M48061 Spinal stenosis, lumbar region without neurogenic claudication: Secondary | ICD-10-CM | POA: Diagnosis not present

## 2011-09-11 DIAGNOSIS — M47817 Spondylosis without myelopathy or radiculopathy, lumbosacral region: Secondary | ICD-10-CM | POA: Diagnosis not present

## 2011-09-13 DIAGNOSIS — M47817 Spondylosis without myelopathy or radiculopathy, lumbosacral region: Secondary | ICD-10-CM | POA: Diagnosis not present

## 2011-09-13 DIAGNOSIS — M48061 Spinal stenosis, lumbar region without neurogenic claudication: Secondary | ICD-10-CM | POA: Diagnosis not present

## 2011-09-13 DIAGNOSIS — M4310 Spondylolisthesis, site unspecified: Secondary | ICD-10-CM | POA: Diagnosis not present

## 2011-09-13 DIAGNOSIS — M549 Dorsalgia, unspecified: Secondary | ICD-10-CM | POA: Diagnosis not present

## 2011-09-13 DIAGNOSIS — M5126 Other intervertebral disc displacement, lumbar region: Secondary | ICD-10-CM | POA: Diagnosis not present

## 2011-09-14 DIAGNOSIS — R059 Cough, unspecified: Secondary | ICD-10-CM | POA: Diagnosis not present

## 2011-09-14 DIAGNOSIS — R05 Cough: Secondary | ICD-10-CM | POA: Diagnosis not present

## 2011-09-18 DIAGNOSIS — IMO0001 Reserved for inherently not codable concepts without codable children: Secondary | ICD-10-CM | POA: Diagnosis not present

## 2011-09-18 DIAGNOSIS — M545 Low back pain: Secondary | ICD-10-CM | POA: Diagnosis not present

## 2011-09-18 DIAGNOSIS — G4700 Insomnia, unspecified: Secondary | ICD-10-CM | POA: Diagnosis not present

## 2011-09-18 DIAGNOSIS — M62838 Other muscle spasm: Secondary | ICD-10-CM | POA: Diagnosis not present

## 2011-10-06 DIAGNOSIS — E071 Dyshormogenetic goiter: Secondary | ICD-10-CM | POA: Diagnosis not present

## 2011-10-06 DIAGNOSIS — E782 Mixed hyperlipidemia: Secondary | ICD-10-CM | POA: Diagnosis not present

## 2011-10-06 DIAGNOSIS — IMO0002 Reserved for concepts with insufficient information to code with codable children: Secondary | ICD-10-CM | POA: Diagnosis not present

## 2011-10-06 DIAGNOSIS — I10 Essential (primary) hypertension: Secondary | ICD-10-CM | POA: Diagnosis not present

## 2011-10-06 DIAGNOSIS — E039 Hypothyroidism, unspecified: Secondary | ICD-10-CM | POA: Diagnosis not present

## 2011-10-06 DIAGNOSIS — E041 Nontoxic single thyroid nodule: Secondary | ICD-10-CM | POA: Diagnosis not present

## 2011-10-13 DIAGNOSIS — E782 Mixed hyperlipidemia: Secondary | ICD-10-CM | POA: Diagnosis not present

## 2011-10-13 DIAGNOSIS — E039 Hypothyroidism, unspecified: Secondary | ICD-10-CM | POA: Diagnosis not present

## 2011-10-13 DIAGNOSIS — D34 Benign neoplasm of thyroid gland: Secondary | ICD-10-CM | POA: Diagnosis not present

## 2011-10-13 DIAGNOSIS — I10 Essential (primary) hypertension: Secondary | ICD-10-CM | POA: Diagnosis not present

## 2011-10-27 DIAGNOSIS — J45909 Unspecified asthma, uncomplicated: Secondary | ICD-10-CM | POA: Diagnosis not present

## 2011-10-27 DIAGNOSIS — M47817 Spondylosis without myelopathy or radiculopathy, lumbosacral region: Secondary | ICD-10-CM | POA: Diagnosis not present

## 2011-10-27 DIAGNOSIS — M48062 Spinal stenosis, lumbar region with neurogenic claudication: Secondary | ICD-10-CM | POA: Diagnosis not present

## 2011-10-27 DIAGNOSIS — I10 Essential (primary) hypertension: Secondary | ICD-10-CM | POA: Diagnosis not present

## 2011-10-27 DIAGNOSIS — M4310 Spondylolisthesis, site unspecified: Secondary | ICD-10-CM | POA: Diagnosis not present

## 2011-10-27 DIAGNOSIS — M48061 Spinal stenosis, lumbar region without neurogenic claudication: Secondary | ICD-10-CM | POA: Diagnosis not present

## 2011-10-27 DIAGNOSIS — T84498A Other mechanical complication of other internal orthopedic devices, implants and grafts, initial encounter: Secondary | ICD-10-CM | POA: Diagnosis not present

## 2011-10-27 DIAGNOSIS — G8929 Other chronic pain: Secondary | ICD-10-CM | POA: Diagnosis not present

## 2011-10-27 DIAGNOSIS — N390 Urinary tract infection, site not specified: Secondary | ICD-10-CM | POA: Diagnosis not present

## 2011-11-20 DIAGNOSIS — M549 Dorsalgia, unspecified: Secondary | ICD-10-CM | POA: Diagnosis not present

## 2011-11-20 DIAGNOSIS — M48061 Spinal stenosis, lumbar region without neurogenic claudication: Secondary | ICD-10-CM | POA: Diagnosis not present

## 2011-11-20 DIAGNOSIS — M47817 Spondylosis without myelopathy or radiculopathy, lumbosacral region: Secondary | ICD-10-CM | POA: Diagnosis not present

## 2011-11-20 DIAGNOSIS — M4310 Spondylolisthesis, site unspecified: Secondary | ICD-10-CM | POA: Diagnosis not present

## 2011-12-04 DIAGNOSIS — M1710 Unilateral primary osteoarthritis, unspecified knee: Secondary | ICD-10-CM | POA: Diagnosis not present

## 2011-12-18 DIAGNOSIS — R05 Cough: Secondary | ICD-10-CM | POA: Diagnosis not present

## 2011-12-20 DIAGNOSIS — M4310 Spondylolisthesis, site unspecified: Secondary | ICD-10-CM | POA: Diagnosis not present

## 2011-12-20 DIAGNOSIS — M6281 Muscle weakness (generalized): Secondary | ICD-10-CM | POA: Diagnosis not present

## 2011-12-20 DIAGNOSIS — M545 Low back pain, unspecified: Secondary | ICD-10-CM | POA: Diagnosis not present

## 2011-12-20 DIAGNOSIS — IMO0001 Reserved for inherently not codable concepts without codable children: Secondary | ICD-10-CM | POA: Diagnosis not present

## 2011-12-28 DIAGNOSIS — R002 Palpitations: Secondary | ICD-10-CM | POA: Diagnosis not present

## 2011-12-28 DIAGNOSIS — I10 Essential (primary) hypertension: Secondary | ICD-10-CM | POA: Diagnosis not present

## 2011-12-28 DIAGNOSIS — R000 Tachycardia, unspecified: Secondary | ICD-10-CM | POA: Diagnosis not present

## 2011-12-29 DIAGNOSIS — M6281 Muscle weakness (generalized): Secondary | ICD-10-CM | POA: Diagnosis not present

## 2011-12-29 DIAGNOSIS — M545 Low back pain, unspecified: Secondary | ICD-10-CM | POA: Diagnosis not present

## 2011-12-29 DIAGNOSIS — IMO0001 Reserved for inherently not codable concepts without codable children: Secondary | ICD-10-CM | POA: Diagnosis not present

## 2011-12-29 DIAGNOSIS — M4310 Spondylolisthesis, site unspecified: Secondary | ICD-10-CM | POA: Diagnosis not present

## 2012-01-01 DIAGNOSIS — M4310 Spondylolisthesis, site unspecified: Secondary | ICD-10-CM | POA: Diagnosis not present

## 2012-01-01 DIAGNOSIS — M48061 Spinal stenosis, lumbar region without neurogenic claudication: Secondary | ICD-10-CM | POA: Diagnosis not present

## 2012-01-01 DIAGNOSIS — IMO0002 Reserved for concepts with insufficient information to code with codable children: Secondary | ICD-10-CM | POA: Diagnosis not present

## 2012-01-01 DIAGNOSIS — M47817 Spondylosis without myelopathy or radiculopathy, lumbosacral region: Secondary | ICD-10-CM | POA: Diagnosis not present

## 2012-01-10 DIAGNOSIS — Z01419 Encounter for gynecological examination (general) (routine) without abnormal findings: Secondary | ICD-10-CM | POA: Diagnosis not present

## 2012-01-10 DIAGNOSIS — Z1211 Encounter for screening for malignant neoplasm of colon: Secondary | ICD-10-CM | POA: Diagnosis not present

## 2012-01-10 DIAGNOSIS — Z1239 Encounter for other screening for malignant neoplasm of breast: Secondary | ICD-10-CM | POA: Diagnosis not present

## 2012-01-12 DIAGNOSIS — N83209 Unspecified ovarian cyst, unspecified side: Secondary | ICD-10-CM | POA: Diagnosis not present

## 2012-01-12 DIAGNOSIS — Z1389 Encounter for screening for other disorder: Secondary | ICD-10-CM | POA: Diagnosis not present

## 2012-01-17 DIAGNOSIS — E669 Obesity, unspecified: Secondary | ICD-10-CM | POA: Diagnosis not present

## 2012-01-17 DIAGNOSIS — J45909 Unspecified asthma, uncomplicated: Secondary | ICD-10-CM | POA: Diagnosis not present

## 2012-01-17 DIAGNOSIS — Z23 Encounter for immunization: Secondary | ICD-10-CM | POA: Diagnosis not present

## 2012-01-17 DIAGNOSIS — Z86010 Personal history of colonic polyps: Secondary | ICD-10-CM | POA: Diagnosis not present

## 2012-01-17 DIAGNOSIS — I10 Essential (primary) hypertension: Secondary | ICD-10-CM | POA: Diagnosis not present

## 2012-01-17 DIAGNOSIS — R110 Nausea: Secondary | ICD-10-CM | POA: Diagnosis not present

## 2012-01-18 DIAGNOSIS — Z1231 Encounter for screening mammogram for malignant neoplasm of breast: Secondary | ICD-10-CM | POA: Diagnosis not present

## 2012-01-22 ENCOUNTER — Ambulatory Visit: Payer: Self-pay | Admitting: Obstetrics and Gynecology

## 2012-01-22 DIAGNOSIS — N83209 Unspecified ovarian cyst, unspecified side: Secondary | ICD-10-CM | POA: Diagnosis not present

## 2012-01-22 DIAGNOSIS — Z79899 Other long term (current) drug therapy: Secondary | ICD-10-CM | POA: Diagnosis not present

## 2012-01-22 DIAGNOSIS — Z01812 Encounter for preprocedural laboratory examination: Secondary | ICD-10-CM | POA: Diagnosis not present

## 2012-01-22 LAB — URINALYSIS, COMPLETE
Bilirubin,UR: NEGATIVE
Blood: NEGATIVE
Glucose,UR: NEGATIVE mg/dL (ref 0–75)
Ketone: NEGATIVE
Leukocyte Esterase: NEGATIVE
Nitrite: NEGATIVE
Ph: 5 (ref 4.5–8.0)
Protein: NEGATIVE
RBC,UR: <1 /HPF (ref 0–5)
Specific Gravity: 1.017 (ref 1.003–1.030)
Squamous Epithelial: 1
WBC UR: <1 /HPF (ref 0–5)

## 2012-01-22 LAB — CBC
HCT: 33 % — ABNORMAL LOW (ref 35.0–47.0)
HGB: 10.9 g/dL — ABNORMAL LOW (ref 12.0–16.0)
MCH: 28.2 pg (ref 26.0–34.0)
MCHC: 33.2 g/dL (ref 32.0–36.0)
MCV: 85 fL (ref 80–100)
Platelet: 229 10*3/uL (ref 150–440)
RBC: 3.88 10*6/uL (ref 3.80–5.20)
RDW: 15.9 % — ABNORMAL HIGH (ref 11.5–14.5)
WBC: 6.4 10*3/uL (ref 3.6–11.0)

## 2012-01-22 LAB — PREGNANCY, URINE: Pregnancy Test, Urine: NEGATIVE m[IU]/mL

## 2012-01-24 DIAGNOSIS — H2510 Age-related nuclear cataract, unspecified eye: Secondary | ICD-10-CM | POA: Diagnosis not present

## 2012-01-30 ENCOUNTER — Ambulatory Visit: Payer: Self-pay | Admitting: Obstetrics and Gynecology

## 2012-01-30 DIAGNOSIS — Z91040 Latex allergy status: Secondary | ICD-10-CM | POA: Diagnosis not present

## 2012-01-30 DIAGNOSIS — IMO0001 Reserved for inherently not codable concepts without codable children: Secondary | ICD-10-CM | POA: Diagnosis not present

## 2012-01-30 DIAGNOSIS — M129 Arthropathy, unspecified: Secondary | ICD-10-CM | POA: Diagnosis not present

## 2012-01-30 DIAGNOSIS — M76899 Other specified enthesopathies of unspecified lower limb, excluding foot: Secondary | ICD-10-CM | POA: Diagnosis not present

## 2012-01-30 DIAGNOSIS — J45909 Unspecified asthma, uncomplicated: Secondary | ICD-10-CM | POA: Diagnosis not present

## 2012-01-30 DIAGNOSIS — N83209 Unspecified ovarian cyst, unspecified side: Secondary | ICD-10-CM | POA: Diagnosis not present

## 2012-01-30 DIAGNOSIS — M1990 Unspecified osteoarthritis, unspecified site: Secondary | ICD-10-CM | POA: Diagnosis not present

## 2012-01-30 DIAGNOSIS — K219 Gastro-esophageal reflux disease without esophagitis: Secondary | ICD-10-CM | POA: Diagnosis not present

## 2012-01-30 DIAGNOSIS — E039 Hypothyroidism, unspecified: Secondary | ICD-10-CM | POA: Diagnosis not present

## 2012-01-30 DIAGNOSIS — Z6839 Body mass index (BMI) 39.0-39.9, adult: Secondary | ICD-10-CM | POA: Diagnosis not present

## 2012-01-30 DIAGNOSIS — J309 Allergic rhinitis, unspecified: Secondary | ICD-10-CM | POA: Diagnosis not present

## 2012-01-30 DIAGNOSIS — Z79899 Other long term (current) drug therapy: Secondary | ICD-10-CM | POA: Diagnosis not present

## 2012-01-30 DIAGNOSIS — Z96649 Presence of unspecified artificial hip joint: Secondary | ICD-10-CM | POA: Diagnosis not present

## 2012-01-30 DIAGNOSIS — R002 Palpitations: Secondary | ICD-10-CM | POA: Diagnosis not present

## 2012-01-30 DIAGNOSIS — I10 Essential (primary) hypertension: Secondary | ICD-10-CM | POA: Diagnosis not present

## 2012-01-30 DIAGNOSIS — E669 Obesity, unspecified: Secondary | ICD-10-CM | POA: Diagnosis not present

## 2012-01-30 DIAGNOSIS — Z886 Allergy status to analgesic agent status: Secondary | ICD-10-CM | POA: Diagnosis not present

## 2012-01-30 DIAGNOSIS — G43909 Migraine, unspecified, not intractable, without status migrainosus: Secondary | ICD-10-CM | POA: Diagnosis not present

## 2012-01-30 DIAGNOSIS — Z86010 Personal history of colonic polyps: Secondary | ICD-10-CM | POA: Diagnosis not present

## 2012-01-30 DIAGNOSIS — Z9109 Other allergy status, other than to drugs and biological substances: Secondary | ICD-10-CM | POA: Diagnosis not present

## 2012-01-30 DIAGNOSIS — F411 Generalized anxiety disorder: Secondary | ICD-10-CM | POA: Diagnosis not present

## 2012-01-30 DIAGNOSIS — Z888 Allergy status to other drugs, medicaments and biological substances status: Secondary | ICD-10-CM | POA: Diagnosis not present

## 2012-01-30 DIAGNOSIS — N736 Female pelvic peritoneal adhesions (postinfective): Secondary | ICD-10-CM | POA: Diagnosis not present

## 2012-02-01 LAB — PATHOLOGY REPORT

## 2012-02-06 DIAGNOSIS — R0602 Shortness of breath: Secondary | ICD-10-CM | POA: Diagnosis not present

## 2012-02-06 DIAGNOSIS — R05 Cough: Secondary | ICD-10-CM | POA: Diagnosis not present

## 2012-02-13 DIAGNOSIS — N83209 Unspecified ovarian cyst, unspecified side: Secondary | ICD-10-CM | POA: Diagnosis not present

## 2012-02-22 DIAGNOSIS — I872 Venous insufficiency (chronic) (peripheral): Secondary | ICD-10-CM | POA: Diagnosis not present

## 2012-02-22 DIAGNOSIS — M7989 Other specified soft tissue disorders: Secondary | ICD-10-CM | POA: Diagnosis not present

## 2012-02-22 DIAGNOSIS — I8310 Varicose veins of unspecified lower extremity with inflammation: Secondary | ICD-10-CM | POA: Diagnosis not present

## 2012-02-22 DIAGNOSIS — M79609 Pain in unspecified limb: Secondary | ICD-10-CM | POA: Diagnosis not present

## 2012-02-28 DIAGNOSIS — M6281 Muscle weakness (generalized): Secondary | ICD-10-CM | POA: Diagnosis not present

## 2012-02-28 DIAGNOSIS — M545 Low back pain, unspecified: Secondary | ICD-10-CM | POA: Diagnosis not present

## 2012-02-28 DIAGNOSIS — M4310 Spondylolisthesis, site unspecified: Secondary | ICD-10-CM | POA: Diagnosis not present

## 2012-02-28 DIAGNOSIS — IMO0001 Reserved for inherently not codable concepts without codable children: Secondary | ICD-10-CM | POA: Diagnosis not present

## 2012-02-28 HISTORY — PX: BACK SURGERY: SHX140

## 2012-03-04 DIAGNOSIS — M4310 Spondylolisthesis, site unspecified: Secondary | ICD-10-CM | POA: Diagnosis not present

## 2012-03-04 DIAGNOSIS — M48061 Spinal stenosis, lumbar region without neurogenic claudication: Secondary | ICD-10-CM | POA: Diagnosis not present

## 2012-03-04 DIAGNOSIS — M549 Dorsalgia, unspecified: Secondary | ICD-10-CM | POA: Diagnosis not present

## 2012-03-04 DIAGNOSIS — IMO0002 Reserved for concepts with insufficient information to code with codable children: Secondary | ICD-10-CM | POA: Diagnosis not present

## 2012-03-04 DIAGNOSIS — M47817 Spondylosis without myelopathy or radiculopathy, lumbosacral region: Secondary | ICD-10-CM | POA: Diagnosis not present

## 2012-03-22 DIAGNOSIS — M19049 Primary osteoarthritis, unspecified hand: Secondary | ICD-10-CM | POA: Diagnosis not present

## 2012-03-28 DIAGNOSIS — M79609 Pain in unspecified limb: Secondary | ICD-10-CM | POA: Diagnosis not present

## 2012-03-28 DIAGNOSIS — I8310 Varicose veins of unspecified lower extremity with inflammation: Secondary | ICD-10-CM | POA: Diagnosis not present

## 2012-03-28 DIAGNOSIS — I70219 Atherosclerosis of native arteries of extremities with intermittent claudication, unspecified extremity: Secondary | ICD-10-CM | POA: Diagnosis not present

## 2012-03-28 DIAGNOSIS — M7989 Other specified soft tissue disorders: Secondary | ICD-10-CM | POA: Diagnosis not present

## 2012-03-30 DIAGNOSIS — M545 Low back pain, unspecified: Secondary | ICD-10-CM | POA: Diagnosis not present

## 2012-03-30 DIAGNOSIS — M6281 Muscle weakness (generalized): Secondary | ICD-10-CM | POA: Diagnosis not present

## 2012-03-30 DIAGNOSIS — IMO0001 Reserved for inherently not codable concepts without codable children: Secondary | ICD-10-CM | POA: Diagnosis not present

## 2012-04-18 DIAGNOSIS — F411 Generalized anxiety disorder: Secondary | ICD-10-CM | POA: Diagnosis not present

## 2012-04-18 DIAGNOSIS — R7309 Other abnormal glucose: Secondary | ICD-10-CM | POA: Diagnosis not present

## 2012-04-18 DIAGNOSIS — I10 Essential (primary) hypertension: Secondary | ICD-10-CM | POA: Diagnosis not present

## 2012-04-18 DIAGNOSIS — E7800 Pure hypercholesterolemia, unspecified: Secondary | ICD-10-CM | POA: Diagnosis not present

## 2012-04-23 DIAGNOSIS — E7800 Pure hypercholesterolemia, unspecified: Secondary | ICD-10-CM | POA: Diagnosis not present

## 2012-04-23 DIAGNOSIS — E039 Hypothyroidism, unspecified: Secondary | ICD-10-CM | POA: Diagnosis not present

## 2012-04-23 DIAGNOSIS — I10 Essential (primary) hypertension: Secondary | ICD-10-CM | POA: Diagnosis not present

## 2012-04-23 LAB — HM HEPATITIS C SCREENING LAB: HM Hepatitis Screen: NEGATIVE

## 2012-04-24 ENCOUNTER — Ambulatory Visit: Payer: Self-pay | Admitting: Gastroenterology

## 2012-04-24 DIAGNOSIS — K219 Gastro-esophageal reflux disease without esophagitis: Secondary | ICD-10-CM | POA: Diagnosis not present

## 2012-04-24 DIAGNOSIS — I10 Essential (primary) hypertension: Secondary | ICD-10-CM | POA: Diagnosis not present

## 2012-04-24 DIAGNOSIS — Z79899 Other long term (current) drug therapy: Secondary | ICD-10-CM | POA: Diagnosis not present

## 2012-04-24 DIAGNOSIS — F411 Generalized anxiety disorder: Secondary | ICD-10-CM | POA: Diagnosis not present

## 2012-04-24 DIAGNOSIS — IMO0001 Reserved for inherently not codable concepts without codable children: Secondary | ICD-10-CM | POA: Diagnosis not present

## 2012-04-24 DIAGNOSIS — Z09 Encounter for follow-up examination after completed treatment for conditions other than malignant neoplasm: Secondary | ICD-10-CM | POA: Diagnosis not present

## 2012-04-24 DIAGNOSIS — E039 Hypothyroidism, unspecified: Secondary | ICD-10-CM | POA: Diagnosis not present

## 2012-04-24 DIAGNOSIS — Z860100 Personal history of colon polyps, unspecified: Secondary | ICD-10-CM | POA: Diagnosis not present

## 2012-04-24 DIAGNOSIS — J45909 Unspecified asthma, uncomplicated: Secondary | ICD-10-CM | POA: Diagnosis not present

## 2012-04-27 DIAGNOSIS — M545 Low back pain: Secondary | ICD-10-CM | POA: Diagnosis not present

## 2012-04-27 DIAGNOSIS — IMO0001 Reserved for inherently not codable concepts without codable children: Secondary | ICD-10-CM | POA: Diagnosis not present

## 2012-04-27 DIAGNOSIS — M6281 Muscle weakness (generalized): Secondary | ICD-10-CM | POA: Diagnosis not present

## 2012-05-02 DIAGNOSIS — M545 Low back pain: Secondary | ICD-10-CM | POA: Diagnosis not present

## 2012-05-02 DIAGNOSIS — M6281 Muscle weakness (generalized): Secondary | ICD-10-CM | POA: Diagnosis not present

## 2012-05-02 DIAGNOSIS — IMO0001 Reserved for inherently not codable concepts without codable children: Secondary | ICD-10-CM | POA: Diagnosis not present

## 2012-05-07 DIAGNOSIS — M6281 Muscle weakness (generalized): Secondary | ICD-10-CM | POA: Diagnosis not present

## 2012-05-07 DIAGNOSIS — M545 Low back pain: Secondary | ICD-10-CM | POA: Diagnosis not present

## 2012-05-07 DIAGNOSIS — IMO0001 Reserved for inherently not codable concepts without codable children: Secondary | ICD-10-CM | POA: Diagnosis not present

## 2012-05-08 DIAGNOSIS — D485 Neoplasm of uncertain behavior of skin: Secondary | ICD-10-CM | POA: Diagnosis not present

## 2012-05-08 DIAGNOSIS — D235 Other benign neoplasm of skin of trunk: Secondary | ICD-10-CM | POA: Diagnosis not present

## 2012-05-09 DIAGNOSIS — M542 Cervicalgia: Secondary | ICD-10-CM | POA: Diagnosis not present

## 2012-05-09 DIAGNOSIS — R5381 Other malaise: Secondary | ICD-10-CM | POA: Diagnosis not present

## 2012-05-09 DIAGNOSIS — IMO0001 Reserved for inherently not codable concepts without codable children: Secondary | ICD-10-CM | POA: Diagnosis not present

## 2012-05-09 DIAGNOSIS — M545 Low back pain: Secondary | ICD-10-CM | POA: Diagnosis not present

## 2012-05-09 DIAGNOSIS — G4700 Insomnia, unspecified: Secondary | ICD-10-CM | POA: Diagnosis not present

## 2012-05-14 DIAGNOSIS — M6281 Muscle weakness (generalized): Secondary | ICD-10-CM | POA: Diagnosis not present

## 2012-05-14 DIAGNOSIS — IMO0001 Reserved for inherently not codable concepts without codable children: Secondary | ICD-10-CM | POA: Diagnosis not present

## 2012-05-14 DIAGNOSIS — M545 Low back pain: Secondary | ICD-10-CM | POA: Diagnosis not present

## 2012-05-22 DIAGNOSIS — M79609 Pain in unspecified limb: Secondary | ICD-10-CM | POA: Diagnosis not present

## 2012-05-22 DIAGNOSIS — I890 Lymphedema, not elsewhere classified: Secondary | ICD-10-CM | POA: Diagnosis not present

## 2012-05-22 DIAGNOSIS — M7989 Other specified soft tissue disorders: Secondary | ICD-10-CM | POA: Diagnosis not present

## 2012-05-22 DIAGNOSIS — I8310 Varicose veins of unspecified lower extremity with inflammation: Secondary | ICD-10-CM | POA: Diagnosis not present

## 2012-06-03 DIAGNOSIS — M48061 Spinal stenosis, lumbar region without neurogenic claudication: Secondary | ICD-10-CM | POA: Diagnosis not present

## 2012-06-03 DIAGNOSIS — IMO0002 Reserved for concepts with insufficient information to code with codable children: Secondary | ICD-10-CM | POA: Diagnosis not present

## 2012-06-03 DIAGNOSIS — M4310 Spondylolisthesis, site unspecified: Secondary | ICD-10-CM | POA: Diagnosis not present

## 2012-06-03 DIAGNOSIS — M47817 Spondylosis without myelopathy or radiculopathy, lumbosacral region: Secondary | ICD-10-CM | POA: Diagnosis not present

## 2012-06-03 DIAGNOSIS — M549 Dorsalgia, unspecified: Secondary | ICD-10-CM | POA: Diagnosis not present

## 2012-06-04 DIAGNOSIS — Z96649 Presence of unspecified artificial hip joint: Secondary | ICD-10-CM | POA: Diagnosis not present

## 2012-06-04 DIAGNOSIS — M76899 Other specified enthesopathies of unspecified lower limb, excluding foot: Secondary | ICD-10-CM | POA: Diagnosis not present

## 2012-06-17 DIAGNOSIS — J45909 Unspecified asthma, uncomplicated: Secondary | ICD-10-CM | POA: Diagnosis not present

## 2012-06-17 DIAGNOSIS — J310 Chronic rhinitis: Secondary | ICD-10-CM | POA: Diagnosis not present

## 2012-06-17 DIAGNOSIS — R05 Cough: Secondary | ICD-10-CM | POA: Diagnosis not present

## 2012-06-17 DIAGNOSIS — G479 Sleep disorder, unspecified: Secondary | ICD-10-CM | POA: Diagnosis not present

## 2012-06-19 DIAGNOSIS — R0989 Other specified symptoms and signs involving the circulatory and respiratory systems: Secondary | ICD-10-CM | POA: Diagnosis not present

## 2012-06-19 DIAGNOSIS — R0609 Other forms of dyspnea: Secondary | ICD-10-CM | POA: Diagnosis not present

## 2012-06-20 DIAGNOSIS — M4310 Spondylolisthesis, site unspecified: Secondary | ICD-10-CM | POA: Diagnosis not present

## 2012-06-20 DIAGNOSIS — M47817 Spondylosis without myelopathy or radiculopathy, lumbosacral region: Secondary | ICD-10-CM | POA: Diagnosis not present

## 2012-06-20 DIAGNOSIS — R0602 Shortness of breath: Secondary | ICD-10-CM | POA: Diagnosis not present

## 2012-06-20 DIAGNOSIS — M48061 Spinal stenosis, lumbar region without neurogenic claudication: Secondary | ICD-10-CM | POA: Diagnosis not present

## 2012-06-20 DIAGNOSIS — Z981 Arthrodesis status: Secondary | ICD-10-CM | POA: Diagnosis not present

## 2012-06-20 DIAGNOSIS — M549 Dorsalgia, unspecified: Secondary | ICD-10-CM | POA: Diagnosis not present

## 2012-06-21 DIAGNOSIS — M238X9 Other internal derangements of unspecified knee: Secondary | ICD-10-CM | POA: Diagnosis not present

## 2012-06-21 DIAGNOSIS — H16219 Exposure keratoconjunctivitis, unspecified eye: Secondary | ICD-10-CM | POA: Diagnosis not present

## 2012-06-27 DIAGNOSIS — I8310 Varicose veins of unspecified lower extremity with inflammation: Secondary | ICD-10-CM | POA: Diagnosis not present

## 2012-06-28 ENCOUNTER — Ambulatory Visit: Payer: Self-pay | Admitting: General Practice

## 2012-06-28 DIAGNOSIS — S83289A Other tear of lateral meniscus, current injury, unspecified knee, initial encounter: Secondary | ICD-10-CM | POA: Diagnosis not present

## 2012-06-28 DIAGNOSIS — S83509A Sprain of unspecified cruciate ligament of unspecified knee, initial encounter: Secondary | ICD-10-CM | POA: Diagnosis not present

## 2012-06-28 DIAGNOSIS — IMO0002 Reserved for concepts with insufficient information to code with codable children: Secondary | ICD-10-CM | POA: Diagnosis not present

## 2012-07-03 DIAGNOSIS — I8310 Varicose veins of unspecified lower extremity with inflammation: Secondary | ICD-10-CM | POA: Diagnosis not present

## 2012-07-16 DIAGNOSIS — M76899 Other specified enthesopathies of unspecified lower limb, excluding foot: Secondary | ICD-10-CM | POA: Diagnosis not present

## 2012-07-16 DIAGNOSIS — M238X9 Other internal derangements of unspecified knee: Secondary | ICD-10-CM | POA: Diagnosis not present

## 2012-07-18 DIAGNOSIS — E7800 Pure hypercholesterolemia, unspecified: Secondary | ICD-10-CM | POA: Diagnosis not present

## 2012-07-18 DIAGNOSIS — I10 Essential (primary) hypertension: Secondary | ICD-10-CM | POA: Diagnosis not present

## 2012-07-18 DIAGNOSIS — R7309 Other abnormal glucose: Secondary | ICD-10-CM | POA: Diagnosis not present

## 2012-07-29 DIAGNOSIS — I872 Venous insufficiency (chronic) (peripheral): Secondary | ICD-10-CM | POA: Diagnosis not present

## 2012-07-29 DIAGNOSIS — M79609 Pain in unspecified limb: Secondary | ICD-10-CM | POA: Diagnosis not present

## 2012-07-29 DIAGNOSIS — M7989 Other specified soft tissue disorders: Secondary | ICD-10-CM | POA: Diagnosis not present

## 2012-07-29 DIAGNOSIS — I8310 Varicose veins of unspecified lower extremity with inflammation: Secondary | ICD-10-CM | POA: Diagnosis not present

## 2012-07-31 DIAGNOSIS — R05 Cough: Secondary | ICD-10-CM | POA: Diagnosis not present

## 2012-07-31 DIAGNOSIS — J449 Chronic obstructive pulmonary disease, unspecified: Secondary | ICD-10-CM | POA: Diagnosis not present

## 2012-08-15 DIAGNOSIS — I8310 Varicose veins of unspecified lower extremity with inflammation: Secondary | ICD-10-CM | POA: Diagnosis not present

## 2012-08-22 DIAGNOSIS — M25569 Pain in unspecified knee: Secondary | ICD-10-CM | POA: Diagnosis not present

## 2012-08-23 DIAGNOSIS — I10 Essential (primary) hypertension: Secondary | ICD-10-CM | POA: Diagnosis not present

## 2012-08-23 DIAGNOSIS — I8310 Varicose veins of unspecified lower extremity with inflammation: Secondary | ICD-10-CM | POA: Diagnosis not present

## 2012-08-23 DIAGNOSIS — M79609 Pain in unspecified limb: Secondary | ICD-10-CM | POA: Diagnosis not present

## 2012-08-23 DIAGNOSIS — M7989 Other specified soft tissue disorders: Secondary | ICD-10-CM | POA: Diagnosis not present

## 2012-10-02 DIAGNOSIS — M4310 Spondylolisthesis, site unspecified: Secondary | ICD-10-CM | POA: Diagnosis not present

## 2012-10-02 DIAGNOSIS — M47817 Spondylosis without myelopathy or radiculopathy, lumbosacral region: Secondary | ICD-10-CM | POA: Diagnosis not present

## 2012-10-15 DIAGNOSIS — G4700 Insomnia, unspecified: Secondary | ICD-10-CM | POA: Diagnosis not present

## 2012-10-15 DIAGNOSIS — R5381 Other malaise: Secondary | ICD-10-CM | POA: Diagnosis not present

## 2012-10-15 DIAGNOSIS — M25569 Pain in unspecified knee: Secondary | ICD-10-CM | POA: Diagnosis not present

## 2012-10-15 DIAGNOSIS — IMO0001 Reserved for inherently not codable concepts without codable children: Secondary | ICD-10-CM | POA: Diagnosis not present

## 2012-10-16 DIAGNOSIS — I10 Essential (primary) hypertension: Secondary | ICD-10-CM | POA: Diagnosis not present

## 2012-10-16 DIAGNOSIS — E039 Hypothyroidism, unspecified: Secondary | ICD-10-CM | POA: Diagnosis not present

## 2012-10-16 DIAGNOSIS — D34 Benign neoplasm of thyroid gland: Secondary | ICD-10-CM | POA: Diagnosis not present

## 2012-10-16 DIAGNOSIS — E049 Nontoxic goiter, unspecified: Secondary | ICD-10-CM | POA: Diagnosis not present

## 2012-10-16 DIAGNOSIS — E071 Dyshormogenetic goiter: Secondary | ICD-10-CM | POA: Diagnosis not present

## 2012-10-16 DIAGNOSIS — E782 Mixed hyperlipidemia: Secondary | ICD-10-CM | POA: Diagnosis not present

## 2012-10-17 DIAGNOSIS — Z23 Encounter for immunization: Secondary | ICD-10-CM | POA: Diagnosis not present

## 2012-10-17 DIAGNOSIS — R7309 Other abnormal glucose: Secondary | ICD-10-CM | POA: Diagnosis not present

## 2012-10-17 DIAGNOSIS — E7800 Pure hypercholesterolemia, unspecified: Secondary | ICD-10-CM | POA: Diagnosis not present

## 2012-10-17 DIAGNOSIS — I10 Essential (primary) hypertension: Secondary | ICD-10-CM | POA: Diagnosis not present

## 2012-10-17 DIAGNOSIS — L259 Unspecified contact dermatitis, unspecified cause: Secondary | ICD-10-CM | POA: Diagnosis not present

## 2012-10-23 DIAGNOSIS — E049 Nontoxic goiter, unspecified: Secondary | ICD-10-CM | POA: Diagnosis not present

## 2012-10-23 DIAGNOSIS — I10 Essential (primary) hypertension: Secondary | ICD-10-CM | POA: Diagnosis not present

## 2012-10-23 DIAGNOSIS — E782 Mixed hyperlipidemia: Secondary | ICD-10-CM | POA: Diagnosis not present

## 2012-10-23 DIAGNOSIS — E039 Hypothyroidism, unspecified: Secondary | ICD-10-CM | POA: Diagnosis not present

## 2012-11-03 ENCOUNTER — Emergency Department: Payer: Self-pay | Admitting: Emergency Medicine

## 2012-11-03 DIAGNOSIS — I10 Essential (primary) hypertension: Secondary | ICD-10-CM | POA: Diagnosis not present

## 2012-11-03 DIAGNOSIS — E785 Hyperlipidemia, unspecified: Secondary | ICD-10-CM | POA: Diagnosis not present

## 2012-11-03 DIAGNOSIS — I471 Supraventricular tachycardia: Secondary | ICD-10-CM | POA: Diagnosis not present

## 2012-11-03 DIAGNOSIS — Z888 Allergy status to other drugs, medicaments and biological substances status: Secondary | ICD-10-CM | POA: Diagnosis not present

## 2012-11-03 DIAGNOSIS — J45909 Unspecified asthma, uncomplicated: Secondary | ICD-10-CM | POA: Diagnosis not present

## 2012-11-03 DIAGNOSIS — Z9079 Acquired absence of other genital organ(s): Secondary | ICD-10-CM | POA: Diagnosis not present

## 2012-11-03 DIAGNOSIS — Z79899 Other long term (current) drug therapy: Secondary | ICD-10-CM | POA: Diagnosis not present

## 2012-11-03 DIAGNOSIS — Z96649 Presence of unspecified artificial hip joint: Secondary | ICD-10-CM | POA: Diagnosis not present

## 2012-11-03 DIAGNOSIS — R079 Chest pain, unspecified: Secondary | ICD-10-CM | POA: Diagnosis not present

## 2012-11-03 LAB — CBC
HCT: 43.5 % (ref 35.0–47.0)
HGB: 14.8 g/dL (ref 12.0–16.0)
MCH: 30 pg (ref 26.0–34.0)
MCHC: 34 g/dL (ref 32.0–36.0)
MCV: 88 fL (ref 80–100)
Platelet: 283 10*3/uL (ref 150–440)
RBC: 4.93 10*6/uL (ref 3.80–5.20)
RDW: 14.3 % (ref 11.5–14.5)
WBC: 8.6 10*3/uL (ref 3.6–11.0)

## 2012-11-04 DIAGNOSIS — R079 Chest pain, unspecified: Secondary | ICD-10-CM | POA: Diagnosis not present

## 2012-11-04 LAB — BASIC METABOLIC PANEL
Anion Gap: 8 (ref 7–16)
BUN: 25 mg/dL — ABNORMAL HIGH (ref 7–18)
Calcium, Total: 9.3 mg/dL (ref 8.5–10.1)
Chloride: 107 mmol/L (ref 98–107)
Co2: 22 mmol/L (ref 21–32)
Creatinine: 1.03 mg/dL (ref 0.60–1.30)
EGFR (African American): >60
EGFR (Non-African Amer.): 58 — ABNORMAL LOW
Glucose: 159 mg/dL — ABNORMAL HIGH (ref 65–99)
Osmolality: 282 (ref 275–301)
Potassium: 3.9 mmol/L (ref 3.5–5.1)
Sodium: 137 mmol/L (ref 136–145)

## 2012-11-04 LAB — TSH: Thyroid Stimulating Horm: 1.11 u[IU]/mL

## 2012-11-04 LAB — TROPONIN I: Troponin-I: <0.02 ng/mL

## 2012-11-04 LAB — MAGNESIUM: Magnesium: 1.7 mg/dL — ABNORMAL LOW

## 2012-11-04 LAB — CK TOTAL AND CKMB (NOT AT ARMC)
CK, Total: 95 U/L (ref 21–215)
CK-MB: 0.8 ng/mL (ref 0.5–3.6)

## 2012-11-05 DIAGNOSIS — J45909 Unspecified asthma, uncomplicated: Secondary | ICD-10-CM | POA: Diagnosis not present

## 2012-11-05 DIAGNOSIS — R05 Cough: Secondary | ICD-10-CM | POA: Diagnosis not present

## 2012-11-07 DIAGNOSIS — I471 Supraventricular tachycardia: Secondary | ICD-10-CM | POA: Diagnosis not present

## 2012-11-07 DIAGNOSIS — R011 Cardiac murmur, unspecified: Secondary | ICD-10-CM | POA: Diagnosis not present

## 2012-11-07 DIAGNOSIS — R079 Chest pain, unspecified: Secondary | ICD-10-CM | POA: Diagnosis not present

## 2012-11-11 DIAGNOSIS — M7989 Other specified soft tissue disorders: Secondary | ICD-10-CM | POA: Diagnosis not present

## 2012-11-11 DIAGNOSIS — I8310 Varicose veins of unspecified lower extremity with inflammation: Secondary | ICD-10-CM | POA: Diagnosis not present

## 2012-11-11 DIAGNOSIS — I872 Venous insufficiency (chronic) (peripheral): Secondary | ICD-10-CM | POA: Diagnosis not present

## 2012-11-11 DIAGNOSIS — M79609 Pain in unspecified limb: Secondary | ICD-10-CM | POA: Diagnosis not present

## 2012-11-13 DIAGNOSIS — M25569 Pain in unspecified knee: Secondary | ICD-10-CM | POA: Diagnosis not present

## 2012-11-14 DIAGNOSIS — G44009 Cluster headache syndrome, unspecified, not intractable: Secondary | ICD-10-CM | POA: Diagnosis not present

## 2012-11-14 DIAGNOSIS — G43119 Migraine with aura, intractable, without status migrainosus: Secondary | ICD-10-CM | POA: Diagnosis not present

## 2012-11-21 ENCOUNTER — Other Ambulatory Visit: Payer: Self-pay | Admitting: Family

## 2012-11-21 DIAGNOSIS — R197 Diarrhea, unspecified: Secondary | ICD-10-CM | POA: Diagnosis not present

## 2012-11-26 DIAGNOSIS — I471 Supraventricular tachycardia: Secondary | ICD-10-CM | POA: Diagnosis not present

## 2012-11-26 DIAGNOSIS — I369 Nonrheumatic tricuspid valve disorder, unspecified: Secondary | ICD-10-CM | POA: Diagnosis not present

## 2012-11-26 DIAGNOSIS — R011 Cardiac murmur, unspecified: Secondary | ICD-10-CM | POA: Diagnosis not present

## 2012-11-28 DIAGNOSIS — I2510 Atherosclerotic heart disease of native coronary artery without angina pectoris: Secondary | ICD-10-CM | POA: Insufficient documentation

## 2012-11-28 DIAGNOSIS — F419 Anxiety disorder, unspecified: Secondary | ICD-10-CM | POA: Insufficient documentation

## 2012-11-28 DIAGNOSIS — E079 Disorder of thyroid, unspecified: Secondary | ICD-10-CM | POA: Insufficient documentation

## 2012-11-28 DIAGNOSIS — I209 Angina pectoris, unspecified: Secondary | ICD-10-CM | POA: Insufficient documentation

## 2012-11-28 DIAGNOSIS — E669 Obesity, unspecified: Secondary | ICD-10-CM | POA: Insufficient documentation

## 2012-11-28 DIAGNOSIS — G4700 Insomnia, unspecified: Secondary | ICD-10-CM | POA: Insufficient documentation

## 2012-11-28 DIAGNOSIS — R011 Cardiac murmur, unspecified: Secondary | ICD-10-CM | POA: Insufficient documentation

## 2012-11-28 DIAGNOSIS — I10 Essential (primary) hypertension: Secondary | ICD-10-CM | POA: Insufficient documentation

## 2012-11-28 DIAGNOSIS — R000 Tachycardia, unspecified: Secondary | ICD-10-CM | POA: Insufficient documentation

## 2012-11-28 DIAGNOSIS — R002 Palpitations: Secondary | ICD-10-CM | POA: Insufficient documentation

## 2012-11-28 DIAGNOSIS — K219 Gastro-esophageal reflux disease without esophagitis: Secondary | ICD-10-CM | POA: Insufficient documentation

## 2012-11-28 DIAGNOSIS — I471 Supraventricular tachycardia: Secondary | ICD-10-CM | POA: Insufficient documentation

## 2012-11-28 DIAGNOSIS — M797 Fibromyalgia: Secondary | ICD-10-CM | POA: Insufficient documentation

## 2012-11-28 DIAGNOSIS — G2581 Restless legs syndrome: Secondary | ICD-10-CM | POA: Insufficient documentation

## 2012-12-06 DIAGNOSIS — I498 Other specified cardiac arrhythmias: Secondary | ICD-10-CM | POA: Diagnosis not present

## 2012-12-17 DIAGNOSIS — I471 Supraventricular tachycardia, unspecified: Secondary | ICD-10-CM | POA: Diagnosis not present

## 2012-12-17 DIAGNOSIS — R002 Palpitations: Secondary | ICD-10-CM | POA: Diagnosis not present

## 2012-12-17 DIAGNOSIS — R55 Syncope and collapse: Secondary | ICD-10-CM | POA: Diagnosis not present

## 2012-12-17 DIAGNOSIS — I498 Other specified cardiac arrhythmias: Secondary | ICD-10-CM | POA: Diagnosis not present

## 2012-12-17 DIAGNOSIS — R6889 Other general symptoms and signs: Secondary | ICD-10-CM | POA: Diagnosis not present

## 2012-12-17 DIAGNOSIS — R5381 Other malaise: Secondary | ICD-10-CM | POA: Diagnosis not present

## 2012-12-18 DIAGNOSIS — I498 Other specified cardiac arrhythmias: Secondary | ICD-10-CM | POA: Diagnosis not present

## 2012-12-18 DIAGNOSIS — I471 Supraventricular tachycardia: Secondary | ICD-10-CM | POA: Diagnosis not present

## 2013-02-11 DIAGNOSIS — IMO0001 Reserved for inherently not codable concepts without codable children: Secondary | ICD-10-CM | POA: Diagnosis not present

## 2013-02-11 DIAGNOSIS — G4700 Insomnia, unspecified: Secondary | ICD-10-CM | POA: Diagnosis not present

## 2013-02-11 DIAGNOSIS — M76899 Other specified enthesopathies of unspecified lower limb, excluding foot: Secondary | ICD-10-CM | POA: Diagnosis not present

## 2013-02-24 DIAGNOSIS — IMO0001 Reserved for inherently not codable concepts without codable children: Secondary | ICD-10-CM | POA: Diagnosis not present

## 2013-02-24 DIAGNOSIS — E7800 Pure hypercholesterolemia, unspecified: Secondary | ICD-10-CM | POA: Diagnosis not present

## 2013-02-24 DIAGNOSIS — E8881 Metabolic syndrome: Secondary | ICD-10-CM | POA: Diagnosis not present

## 2013-02-24 DIAGNOSIS — I10 Essential (primary) hypertension: Secondary | ICD-10-CM | POA: Diagnosis not present

## 2013-02-24 DIAGNOSIS — F411 Generalized anxiety disorder: Secondary | ICD-10-CM | POA: Diagnosis not present

## 2013-02-25 DIAGNOSIS — I471 Supraventricular tachycardia: Secondary | ICD-10-CM | POA: Diagnosis not present

## 2013-03-17 DIAGNOSIS — I10 Essential (primary) hypertension: Secondary | ICD-10-CM | POA: Diagnosis not present

## 2013-03-17 DIAGNOSIS — M79609 Pain in unspecified limb: Secondary | ICD-10-CM | POA: Diagnosis not present

## 2013-03-17 DIAGNOSIS — E7800 Pure hypercholesterolemia, unspecified: Secondary | ICD-10-CM | POA: Diagnosis not present

## 2013-03-17 DIAGNOSIS — I872 Venous insufficiency (chronic) (peripheral): Secondary | ICD-10-CM | POA: Diagnosis not present

## 2013-03-17 DIAGNOSIS — D649 Anemia, unspecified: Secondary | ICD-10-CM | POA: Diagnosis not present

## 2013-03-17 DIAGNOSIS — I8310 Varicose veins of unspecified lower extremity with inflammation: Secondary | ICD-10-CM | POA: Diagnosis not present

## 2013-03-18 DIAGNOSIS — R5381 Other malaise: Secondary | ICD-10-CM | POA: Diagnosis not present

## 2013-03-18 DIAGNOSIS — M542 Cervicalgia: Secondary | ICD-10-CM | POA: Diagnosis not present

## 2013-03-18 DIAGNOSIS — Z79899 Other long term (current) drug therapy: Secondary | ICD-10-CM | POA: Diagnosis not present

## 2013-03-18 DIAGNOSIS — IMO0001 Reserved for inherently not codable concepts without codable children: Secondary | ICD-10-CM | POA: Diagnosis not present

## 2013-03-18 DIAGNOSIS — R5383 Other fatigue: Secondary | ICD-10-CM | POA: Diagnosis not present

## 2013-03-18 DIAGNOSIS — E559 Vitamin D deficiency, unspecified: Secondary | ICD-10-CM | POA: Diagnosis not present

## 2013-03-18 DIAGNOSIS — M76899 Other specified enthesopathies of unspecified lower limb, excluding foot: Secondary | ICD-10-CM | POA: Diagnosis not present

## 2013-03-21 DIAGNOSIS — Z01419 Encounter for gynecological examination (general) (routine) without abnormal findings: Secondary | ICD-10-CM | POA: Diagnosis not present

## 2013-03-21 DIAGNOSIS — Z79890 Hormone replacement therapy (postmenopausal): Secondary | ICD-10-CM | POA: Diagnosis not present

## 2013-03-25 DIAGNOSIS — Z1231 Encounter for screening mammogram for malignant neoplasm of breast: Secondary | ICD-10-CM | POA: Diagnosis not present

## 2013-04-21 DIAGNOSIS — J40 Bronchitis, not specified as acute or chronic: Secondary | ICD-10-CM | POA: Diagnosis not present

## 2013-04-21 DIAGNOSIS — E039 Hypothyroidism, unspecified: Secondary | ICD-10-CM | POA: Diagnosis not present

## 2013-04-21 DIAGNOSIS — E7800 Pure hypercholesterolemia, unspecified: Secondary | ICD-10-CM | POA: Diagnosis not present

## 2013-05-02 DIAGNOSIS — M25569 Pain in unspecified knee: Secondary | ICD-10-CM | POA: Diagnosis not present

## 2013-05-13 ENCOUNTER — Ambulatory Visit: Payer: Self-pay | Admitting: Obstetrics and Gynecology

## 2013-05-13 DIAGNOSIS — M899 Disorder of bone, unspecified: Secondary | ICD-10-CM | POA: Diagnosis not present

## 2013-05-13 DIAGNOSIS — M949 Disorder of cartilage, unspecified: Secondary | ICD-10-CM | POA: Diagnosis not present

## 2013-05-13 DIAGNOSIS — Z13820 Encounter for screening for osteoporosis: Secondary | ICD-10-CM | POA: Diagnosis not present

## 2013-05-15 DIAGNOSIS — M1710 Unilateral primary osteoarthritis, unspecified knee: Secondary | ICD-10-CM | POA: Diagnosis not present

## 2013-05-15 DIAGNOSIS — IMO0002 Reserved for concepts with insufficient information to code with codable children: Secondary | ICD-10-CM | POA: Diagnosis not present

## 2013-05-22 DIAGNOSIS — IMO0002 Reserved for concepts with insufficient information to code with codable children: Secondary | ICD-10-CM | POA: Diagnosis not present

## 2013-05-22 DIAGNOSIS — M1710 Unilateral primary osteoarthritis, unspecified knee: Secondary | ICD-10-CM | POA: Diagnosis not present

## 2013-05-29 DIAGNOSIS — M1710 Unilateral primary osteoarthritis, unspecified knee: Secondary | ICD-10-CM | POA: Diagnosis not present

## 2013-05-29 DIAGNOSIS — IMO0002 Reserved for concepts with insufficient information to code with codable children: Secondary | ICD-10-CM | POA: Diagnosis not present

## 2013-06-03 DIAGNOSIS — J45909 Unspecified asthma, uncomplicated: Secondary | ICD-10-CM | POA: Diagnosis not present

## 2013-06-03 DIAGNOSIS — Z23 Encounter for immunization: Secondary | ICD-10-CM | POA: Diagnosis not present

## 2013-06-03 DIAGNOSIS — I10 Essential (primary) hypertension: Secondary | ICD-10-CM | POA: Diagnosis not present

## 2013-06-03 DIAGNOSIS — E7800 Pure hypercholesterolemia, unspecified: Secondary | ICD-10-CM | POA: Diagnosis not present

## 2013-07-02 ENCOUNTER — Ambulatory Visit: Payer: Self-pay | Admitting: General Practice

## 2013-07-02 DIAGNOSIS — Z01810 Encounter for preprocedural cardiovascular examination: Secondary | ICD-10-CM | POA: Diagnosis not present

## 2013-07-02 DIAGNOSIS — R002 Palpitations: Secondary | ICD-10-CM | POA: Diagnosis not present

## 2013-07-02 DIAGNOSIS — I10 Essential (primary) hypertension: Secondary | ICD-10-CM | POA: Diagnosis not present

## 2013-07-02 DIAGNOSIS — M79609 Pain in unspecified limb: Secondary | ICD-10-CM | POA: Diagnosis not present

## 2013-07-02 DIAGNOSIS — IMO0002 Reserved for concepts with insufficient information to code with codable children: Secondary | ICD-10-CM | POA: Diagnosis not present

## 2013-07-02 DIAGNOSIS — F411 Generalized anxiety disorder: Secondary | ICD-10-CM | POA: Diagnosis not present

## 2013-07-02 DIAGNOSIS — E7800 Pure hypercholesterolemia, unspecified: Secondary | ICD-10-CM | POA: Diagnosis not present

## 2013-07-02 DIAGNOSIS — Z01812 Encounter for preprocedural laboratory examination: Secondary | ICD-10-CM | POA: Diagnosis not present

## 2013-07-02 DIAGNOSIS — Z7982 Long term (current) use of aspirin: Secondary | ICD-10-CM | POA: Diagnosis not present

## 2013-07-02 DIAGNOSIS — Z79899 Other long term (current) drug therapy: Secondary | ICD-10-CM | POA: Diagnosis not present

## 2013-07-02 LAB — BASIC METABOLIC PANEL
Anion Gap: 4 — ABNORMAL LOW (ref 7–16)
BUN: 18 mg/dL (ref 7–18)
Calcium, Total: 9 mg/dL (ref 8.5–10.1)
Chloride: 108 mmol/L — ABNORMAL HIGH (ref 98–107)
Co2: 28 mmol/L (ref 21–32)
Creatinine: 1.04 mg/dL (ref 0.60–1.30)
EGFR (African American): >60
EGFR (Non-African Amer.): 57 — ABNORMAL LOW
Glucose: 116 mg/dL — ABNORMAL HIGH (ref 65–99)
Osmolality: 282 (ref 275–301)
Potassium: 3.4 mmol/L — ABNORMAL LOW (ref 3.5–5.1)
Sodium: 140 mmol/L (ref 136–145)

## 2013-07-02 LAB — CBC
HCT: 38.2 % (ref 35.0–47.0)
HGB: 12.3 g/dL (ref 12.0–16.0)
MCH: 27.8 pg (ref 26.0–34.0)
MCHC: 32.2 g/dL (ref 32.0–36.0)
MCV: 86 fL (ref 80–100)
Platelet: 271 10*3/uL (ref 150–440)
RBC: 4.42 10*6/uL (ref 3.80–5.20)
RDW: 14.3 % (ref 11.5–14.5)
WBC: 6.1 10*3/uL (ref 3.6–11.0)

## 2013-07-02 LAB — SEDIMENTATION RATE: Erythrocyte Sed Rate: 15 mm/hr (ref 0–30)

## 2013-07-02 LAB — URINALYSIS, COMPLETE
Bacteria: NONE SEEN
Bilirubin,UR: NEGATIVE
Blood: NEGATIVE
Glucose,UR: NEGATIVE mg/dL (ref 0–75)
Hyaline Cast: 1
Ketone: NEGATIVE
Leukocyte Esterase: NEGATIVE
Nitrite: NEGATIVE
Ph: 5 (ref 4.5–8.0)
Protein: NEGATIVE
RBC,UR: NONE SEEN /HPF (ref 0–5)
Specific Gravity: 1.018 (ref 1.003–1.030)
Squamous Epithelial: <1
WBC UR: 1 /HPF (ref 0–5)

## 2013-07-02 LAB — APTT: Activated PTT: 30.3 secs (ref 23.6–35.9)

## 2013-07-02 LAB — PROTIME-INR
INR: 1.1
Prothrombin Time: 13.7 secs (ref 11.5–14.7)

## 2013-07-02 LAB — MRSA PCR SCREENING

## 2013-07-03 LAB — URINE CULTURE

## 2013-07-09 DIAGNOSIS — G4700 Insomnia, unspecified: Secondary | ICD-10-CM | POA: Diagnosis not present

## 2013-07-09 DIAGNOSIS — G8929 Other chronic pain: Secondary | ICD-10-CM | POA: Diagnosis not present

## 2013-07-09 DIAGNOSIS — I10 Essential (primary) hypertension: Secondary | ICD-10-CM | POA: Diagnosis not present

## 2013-07-09 DIAGNOSIS — E876 Hypokalemia: Secondary | ICD-10-CM | POA: Diagnosis not present

## 2013-07-10 DIAGNOSIS — R5383 Other fatigue: Secondary | ICD-10-CM | POA: Diagnosis not present

## 2013-07-10 DIAGNOSIS — IMO0001 Reserved for inherently not codable concepts without codable children: Secondary | ICD-10-CM | POA: Diagnosis not present

## 2013-07-10 DIAGNOSIS — M542 Cervicalgia: Secondary | ICD-10-CM | POA: Diagnosis not present

## 2013-07-10 DIAGNOSIS — R5381 Other malaise: Secondary | ICD-10-CM | POA: Diagnosis not present

## 2013-07-10 DIAGNOSIS — M76899 Other specified enthesopathies of unspecified lower limb, excluding foot: Secondary | ICD-10-CM | POA: Diagnosis not present

## 2013-07-14 DIAGNOSIS — R5381 Other malaise: Secondary | ICD-10-CM | POA: Diagnosis not present

## 2013-07-14 DIAGNOSIS — E876 Hypokalemia: Secondary | ICD-10-CM | POA: Diagnosis not present

## 2013-07-14 DIAGNOSIS — R5383 Other fatigue: Secondary | ICD-10-CM | POA: Diagnosis not present

## 2013-07-14 DIAGNOSIS — E559 Vitamin D deficiency, unspecified: Secondary | ICD-10-CM | POA: Diagnosis not present

## 2013-07-16 ENCOUNTER — Inpatient Hospital Stay: Payer: Self-pay | Admitting: General Practice

## 2013-07-16 DIAGNOSIS — Z6837 Body mass index (BMI) 37.0-37.9, adult: Secondary | ICD-10-CM | POA: Diagnosis not present

## 2013-07-16 DIAGNOSIS — G2581 Restless legs syndrome: Secondary | ICD-10-CM | POA: Diagnosis present

## 2013-07-16 DIAGNOSIS — Z96659 Presence of unspecified artificial knee joint: Secondary | ICD-10-CM | POA: Diagnosis not present

## 2013-07-16 DIAGNOSIS — M1610 Unilateral primary osteoarthritis, unspecified hip: Secondary | ICD-10-CM | POA: Diagnosis not present

## 2013-07-16 DIAGNOSIS — E7800 Pure hypercholesterolemia, unspecified: Secondary | ICD-10-CM | POA: Diagnosis present

## 2013-07-16 DIAGNOSIS — M169 Osteoarthritis of hip, unspecified: Secondary | ICD-10-CM | POA: Diagnosis not present

## 2013-07-16 DIAGNOSIS — Z471 Aftercare following joint replacement surgery: Secondary | ICD-10-CM | POA: Diagnosis not present

## 2013-07-16 DIAGNOSIS — IMO0001 Reserved for inherently not codable concepts without codable children: Secondary | ICD-10-CM | POA: Diagnosis present

## 2013-07-16 DIAGNOSIS — E0590 Thyrotoxicosis, unspecified without thyrotoxic crisis or storm: Secondary | ICD-10-CM | POA: Diagnosis present

## 2013-07-16 DIAGNOSIS — E669 Obesity, unspecified: Secondary | ICD-10-CM | POA: Diagnosis present

## 2013-07-16 DIAGNOSIS — F329 Major depressive disorder, single episode, unspecified: Secondary | ICD-10-CM | POA: Diagnosis present

## 2013-07-16 DIAGNOSIS — F3289 Other specified depressive episodes: Secondary | ICD-10-CM | POA: Diagnosis present

## 2013-07-16 DIAGNOSIS — Z7982 Long term (current) use of aspirin: Secondary | ICD-10-CM | POA: Diagnosis not present

## 2013-07-16 DIAGNOSIS — K219 Gastro-esophageal reflux disease without esophagitis: Secondary | ICD-10-CM | POA: Diagnosis present

## 2013-07-16 DIAGNOSIS — G43909 Migraine, unspecified, not intractable, without status migrainosus: Secondary | ICD-10-CM | POA: Diagnosis present

## 2013-07-16 DIAGNOSIS — Z86010 Personal history of colonic polyps: Secondary | ICD-10-CM | POA: Diagnosis not present

## 2013-07-16 DIAGNOSIS — M1710 Unilateral primary osteoarthritis, unspecified knee: Secondary | ICD-10-CM | POA: Diagnosis not present

## 2013-07-16 DIAGNOSIS — F411 Generalized anxiety disorder: Secondary | ICD-10-CM | POA: Diagnosis present

## 2013-07-16 DIAGNOSIS — D62 Acute posthemorrhagic anemia: Secondary | ICD-10-CM | POA: Diagnosis not present

## 2013-07-16 DIAGNOSIS — J45909 Unspecified asthma, uncomplicated: Secondary | ICD-10-CM | POA: Diagnosis present

## 2013-07-16 DIAGNOSIS — R002 Palpitations: Secondary | ICD-10-CM | POA: Diagnosis present

## 2013-07-16 DIAGNOSIS — IMO0002 Reserved for concepts with insufficient information to code with codable children: Secondary | ICD-10-CM | POA: Diagnosis not present

## 2013-07-16 DIAGNOSIS — M4800 Spinal stenosis, site unspecified: Secondary | ICD-10-CM | POA: Diagnosis present

## 2013-07-16 DIAGNOSIS — I10 Essential (primary) hypertension: Secondary | ICD-10-CM | POA: Diagnosis present

## 2013-07-16 DIAGNOSIS — R112 Nausea with vomiting, unspecified: Secondary | ICD-10-CM | POA: Diagnosis present

## 2013-07-16 DIAGNOSIS — G8929 Other chronic pain: Secondary | ICD-10-CM | POA: Diagnosis present

## 2013-07-16 DIAGNOSIS — G4700 Insomnia, unspecified: Secondary | ICD-10-CM | POA: Diagnosis present

## 2013-07-17 LAB — BASIC METABOLIC PANEL
Anion Gap: 4 — ABNORMAL LOW (ref 7–16)
BUN: 9 mg/dL (ref 7–18)
Calcium, Total: 8.8 mg/dL (ref 8.5–10.1)
Chloride: 107 mmol/L (ref 98–107)
Co2: 29 mmol/L (ref 21–32)
Creatinine: 0.78 mg/dL (ref 0.60–1.30)
EGFR (African American): >60
EGFR (Non-African Amer.): >60
Glucose: 165 mg/dL — ABNORMAL HIGH (ref 65–99)
Osmolality: 282 (ref 275–301)
Potassium: 4.4 mmol/L (ref 3.5–5.1)
Sodium: 140 mmol/L (ref 136–145)

## 2013-07-17 LAB — PLATELET COUNT: Platelet: 231 x10 3/mm 3

## 2013-07-17 LAB — HEMOGLOBIN: HGB: 11.1 g/dL — ABNORMAL LOW

## 2013-07-18 LAB — BASIC METABOLIC PANEL
Anion Gap: 10 (ref 7–16)
BUN: 7 mg/dL (ref 7–18)
Calcium, Total: 8.7 mg/dL (ref 8.5–10.1)
Chloride: 104 mmol/L (ref 98–107)
Co2: 24 mmol/L (ref 21–32)
Creatinine: 0.56 mg/dL — ABNORMAL LOW (ref 0.60–1.30)
EGFR (African American): >60
EGFR (Non-African Amer.): >60
Glucose: 124 mg/dL — ABNORMAL HIGH (ref 65–99)
Osmolality: 275 (ref 275–301)
Potassium: 4 mmol/L (ref 3.5–5.1)
Sodium: 138 mmol/L (ref 136–145)

## 2013-07-18 LAB — PLATELET COUNT: Platelet: 213 10*3/uL (ref 150–440)

## 2013-07-18 LAB — HEMOGLOBIN: HGB: 10.7 g/dL — ABNORMAL LOW (ref 12.0–16.0)

## 2013-07-23 DIAGNOSIS — Z96659 Presence of unspecified artificial knee joint: Secondary | ICD-10-CM | POA: Diagnosis not present

## 2013-07-23 DIAGNOSIS — IMO0001 Reserved for inherently not codable concepts without codable children: Secondary | ICD-10-CM | POA: Diagnosis not present

## 2013-07-23 DIAGNOSIS — G894 Chronic pain syndrome: Secondary | ICD-10-CM | POA: Diagnosis not present

## 2013-07-23 DIAGNOSIS — F411 Generalized anxiety disorder: Secondary | ICD-10-CM | POA: Diagnosis not present

## 2013-07-23 DIAGNOSIS — Z471 Aftercare following joint replacement surgery: Secondary | ICD-10-CM | POA: Diagnosis not present

## 2013-07-24 DIAGNOSIS — G894 Chronic pain syndrome: Secondary | ICD-10-CM | POA: Diagnosis not present

## 2013-07-24 DIAGNOSIS — Z471 Aftercare following joint replacement surgery: Secondary | ICD-10-CM | POA: Diagnosis not present

## 2013-07-24 DIAGNOSIS — F411 Generalized anxiety disorder: Secondary | ICD-10-CM | POA: Diagnosis not present

## 2013-07-24 DIAGNOSIS — Z96659 Presence of unspecified artificial knee joint: Secondary | ICD-10-CM | POA: Diagnosis not present

## 2013-07-24 DIAGNOSIS — IMO0001 Reserved for inherently not codable concepts without codable children: Secondary | ICD-10-CM | POA: Diagnosis not present

## 2013-07-25 DIAGNOSIS — Z96659 Presence of unspecified artificial knee joint: Secondary | ICD-10-CM | POA: Diagnosis not present

## 2013-07-25 DIAGNOSIS — Z471 Aftercare following joint replacement surgery: Secondary | ICD-10-CM | POA: Diagnosis not present

## 2013-07-25 DIAGNOSIS — IMO0001 Reserved for inherently not codable concepts without codable children: Secondary | ICD-10-CM | POA: Diagnosis not present

## 2013-07-25 DIAGNOSIS — F411 Generalized anxiety disorder: Secondary | ICD-10-CM | POA: Diagnosis not present

## 2013-07-25 DIAGNOSIS — G894 Chronic pain syndrome: Secondary | ICD-10-CM | POA: Diagnosis not present

## 2013-07-28 DIAGNOSIS — Z471 Aftercare following joint replacement surgery: Secondary | ICD-10-CM | POA: Diagnosis not present

## 2013-07-28 DIAGNOSIS — Z96659 Presence of unspecified artificial knee joint: Secondary | ICD-10-CM | POA: Diagnosis not present

## 2013-07-28 DIAGNOSIS — G894 Chronic pain syndrome: Secondary | ICD-10-CM | POA: Diagnosis not present

## 2013-07-28 DIAGNOSIS — F411 Generalized anxiety disorder: Secondary | ICD-10-CM | POA: Diagnosis not present

## 2013-07-28 DIAGNOSIS — IMO0001 Reserved for inherently not codable concepts without codable children: Secondary | ICD-10-CM | POA: Diagnosis not present

## 2013-07-29 DIAGNOSIS — Z471 Aftercare following joint replacement surgery: Secondary | ICD-10-CM | POA: Diagnosis not present

## 2013-07-29 DIAGNOSIS — G894 Chronic pain syndrome: Secondary | ICD-10-CM | POA: Diagnosis not present

## 2013-07-29 DIAGNOSIS — Z96659 Presence of unspecified artificial knee joint: Secondary | ICD-10-CM | POA: Diagnosis not present

## 2013-07-29 DIAGNOSIS — IMO0001 Reserved for inherently not codable concepts without codable children: Secondary | ICD-10-CM | POA: Diagnosis not present

## 2013-07-29 DIAGNOSIS — F411 Generalized anxiety disorder: Secondary | ICD-10-CM | POA: Diagnosis not present

## 2013-07-30 DIAGNOSIS — Z471 Aftercare following joint replacement surgery: Secondary | ICD-10-CM | POA: Diagnosis not present

## 2013-07-30 DIAGNOSIS — IMO0001 Reserved for inherently not codable concepts without codable children: Secondary | ICD-10-CM | POA: Diagnosis not present

## 2013-07-30 DIAGNOSIS — G894 Chronic pain syndrome: Secondary | ICD-10-CM | POA: Diagnosis not present

## 2013-07-30 DIAGNOSIS — F411 Generalized anxiety disorder: Secondary | ICD-10-CM | POA: Diagnosis not present

## 2013-07-30 DIAGNOSIS — Z96659 Presence of unspecified artificial knee joint: Secondary | ICD-10-CM | POA: Diagnosis not present

## 2013-07-31 DIAGNOSIS — Z96653 Presence of artificial knee joint, bilateral: Secondary | ICD-10-CM | POA: Insufficient documentation

## 2013-07-31 DIAGNOSIS — M25669 Stiffness of unspecified knee, not elsewhere classified: Secondary | ICD-10-CM | POA: Diagnosis not present

## 2013-07-31 DIAGNOSIS — M6281 Muscle weakness (generalized): Secondary | ICD-10-CM | POA: Diagnosis not present

## 2013-07-31 DIAGNOSIS — R262 Difficulty in walking, not elsewhere classified: Secondary | ICD-10-CM | POA: Diagnosis not present

## 2013-07-31 DIAGNOSIS — M25569 Pain in unspecified knee: Secondary | ICD-10-CM | POA: Diagnosis not present

## 2013-08-08 DIAGNOSIS — M25569 Pain in unspecified knee: Secondary | ICD-10-CM | POA: Diagnosis not present

## 2013-08-09 DIAGNOSIS — N390 Urinary tract infection, site not specified: Secondary | ICD-10-CM | POA: Diagnosis not present

## 2013-08-09 DIAGNOSIS — R350 Frequency of micturition: Secondary | ICD-10-CM | POA: Diagnosis not present

## 2013-08-11 DIAGNOSIS — M6281 Muscle weakness (generalized): Secondary | ICD-10-CM | POA: Diagnosis not present

## 2013-08-11 DIAGNOSIS — M25669 Stiffness of unspecified knee, not elsewhere classified: Secondary | ICD-10-CM | POA: Diagnosis not present

## 2013-08-11 DIAGNOSIS — R262 Difficulty in walking, not elsewhere classified: Secondary | ICD-10-CM | POA: Diagnosis not present

## 2013-08-11 DIAGNOSIS — M25569 Pain in unspecified knee: Secondary | ICD-10-CM | POA: Diagnosis not present

## 2013-08-13 DIAGNOSIS — J45909 Unspecified asthma, uncomplicated: Secondary | ICD-10-CM | POA: Diagnosis not present

## 2013-08-13 DIAGNOSIS — M25569 Pain in unspecified knee: Secondary | ICD-10-CM | POA: Diagnosis not present

## 2013-08-13 DIAGNOSIS — N390 Urinary tract infection, site not specified: Secondary | ICD-10-CM | POA: Diagnosis not present

## 2013-08-18 DIAGNOSIS — M25569 Pain in unspecified knee: Secondary | ICD-10-CM | POA: Diagnosis not present

## 2013-08-22 DIAGNOSIS — M25569 Pain in unspecified knee: Secondary | ICD-10-CM | POA: Diagnosis not present

## 2013-08-25 DIAGNOSIS — M25569 Pain in unspecified knee: Secondary | ICD-10-CM | POA: Diagnosis not present

## 2013-08-27 DIAGNOSIS — M25569 Pain in unspecified knee: Secondary | ICD-10-CM | POA: Diagnosis not present

## 2013-08-28 DIAGNOSIS — M25569 Pain in unspecified knee: Secondary | ICD-10-CM | POA: Diagnosis not present

## 2013-08-28 DIAGNOSIS — Z96659 Presence of unspecified artificial knee joint: Secondary | ICD-10-CM | POA: Diagnosis not present

## 2013-09-04 DIAGNOSIS — M76899 Other specified enthesopathies of unspecified lower limb, excluding foot: Secondary | ICD-10-CM | POA: Diagnosis not present

## 2013-09-04 DIAGNOSIS — J40 Bronchitis, not specified as acute or chronic: Secondary | ICD-10-CM | POA: Diagnosis not present

## 2013-09-04 DIAGNOSIS — R42 Dizziness and giddiness: Secondary | ICD-10-CM | POA: Diagnosis not present

## 2013-09-08 DIAGNOSIS — J309 Allergic rhinitis, unspecified: Secondary | ICD-10-CM | POA: Diagnosis not present

## 2013-09-08 DIAGNOSIS — J45909 Unspecified asthma, uncomplicated: Secondary | ICD-10-CM | POA: Diagnosis not present

## 2013-09-08 DIAGNOSIS — T6391XA Toxic effect of contact with unspecified venomous animal, accidental (unintentional), initial encounter: Secondary | ICD-10-CM | POA: Diagnosis not present

## 2013-09-09 DIAGNOSIS — R319 Hematuria, unspecified: Secondary | ICD-10-CM | POA: Diagnosis not present

## 2013-10-08 DIAGNOSIS — R319 Hematuria, unspecified: Secondary | ICD-10-CM | POA: Diagnosis not present

## 2013-10-20 ENCOUNTER — Ambulatory Visit: Payer: Self-pay | Admitting: Urology

## 2013-10-20 DIAGNOSIS — R310 Gross hematuria: Secondary | ICD-10-CM | POA: Diagnosis not present

## 2013-10-20 DIAGNOSIS — R319 Hematuria, unspecified: Secondary | ICD-10-CM | POA: Diagnosis not present

## 2013-10-20 DIAGNOSIS — K7689 Other specified diseases of liver: Secondary | ICD-10-CM | POA: Diagnosis not present

## 2013-10-21 DIAGNOSIS — R319 Hematuria, unspecified: Secondary | ICD-10-CM | POA: Diagnosis not present

## 2013-10-27 DIAGNOSIS — R319 Hematuria, unspecified: Secondary | ICD-10-CM | POA: Diagnosis not present

## 2013-10-28 DIAGNOSIS — Z6837 Body mass index (BMI) 37.0-37.9, adult: Secondary | ICD-10-CM | POA: Diagnosis not present

## 2013-10-28 DIAGNOSIS — J45909 Unspecified asthma, uncomplicated: Secondary | ICD-10-CM | POA: Diagnosis not present

## 2013-10-28 DIAGNOSIS — E049 Nontoxic goiter, unspecified: Secondary | ICD-10-CM | POA: Diagnosis not present

## 2013-10-28 DIAGNOSIS — F411 Generalized anxiety disorder: Secondary | ICD-10-CM | POA: Diagnosis not present

## 2013-10-28 DIAGNOSIS — E782 Mixed hyperlipidemia: Secondary | ICD-10-CM | POA: Diagnosis not present

## 2013-10-28 DIAGNOSIS — E039 Hypothyroidism, unspecified: Secondary | ICD-10-CM | POA: Diagnosis not present

## 2013-10-28 DIAGNOSIS — M810 Age-related osteoporosis without current pathological fracture: Secondary | ICD-10-CM | POA: Diagnosis not present

## 2013-10-28 DIAGNOSIS — I10 Essential (primary) hypertension: Secondary | ICD-10-CM | POA: Diagnosis not present

## 2013-10-28 DIAGNOSIS — E071 Dyshormogenetic goiter: Secondary | ICD-10-CM | POA: Diagnosis not present

## 2013-11-25 DIAGNOSIS — E782 Mixed hyperlipidemia: Secondary | ICD-10-CM | POA: Diagnosis not present

## 2013-11-25 DIAGNOSIS — M159 Polyosteoarthritis, unspecified: Secondary | ICD-10-CM | POA: Diagnosis not present

## 2013-11-25 DIAGNOSIS — F411 Generalized anxiety disorder: Secondary | ICD-10-CM | POA: Diagnosis not present

## 2013-11-25 DIAGNOSIS — I10 Essential (primary) hypertension: Secondary | ICD-10-CM | POA: Diagnosis not present

## 2013-11-25 DIAGNOSIS — E049 Nontoxic goiter, unspecified: Secondary | ICD-10-CM | POA: Diagnosis not present

## 2013-11-25 DIAGNOSIS — E039 Hypothyroidism, unspecified: Secondary | ICD-10-CM | POA: Diagnosis not present

## 2013-11-25 DIAGNOSIS — M810 Age-related osteoporosis without current pathological fracture: Secondary | ICD-10-CM | POA: Diagnosis not present

## 2013-12-01 DIAGNOSIS — M1710 Unilateral primary osteoarthritis, unspecified knee: Secondary | ICD-10-CM | POA: Insufficient documentation

## 2013-12-01 DIAGNOSIS — M179 Osteoarthritis of knee, unspecified: Secondary | ICD-10-CM | POA: Insufficient documentation

## 2013-12-01 DIAGNOSIS — M1711 Unilateral primary osteoarthritis, right knee: Secondary | ICD-10-CM | POA: Diagnosis not present

## 2013-12-08 DIAGNOSIS — M1990 Unspecified osteoarthritis, unspecified site: Secondary | ICD-10-CM | POA: Diagnosis not present

## 2013-12-08 DIAGNOSIS — F411 Generalized anxiety disorder: Secondary | ICD-10-CM | POA: Diagnosis not present

## 2013-12-08 DIAGNOSIS — I10 Essential (primary) hypertension: Secondary | ICD-10-CM | POA: Diagnosis not present

## 2013-12-10 DIAGNOSIS — H16223 Keratoconjunctivitis sicca, not specified as Sjogren's, bilateral: Secondary | ICD-10-CM | POA: Diagnosis not present

## 2014-01-02 DIAGNOSIS — M79642 Pain in left hand: Secondary | ICD-10-CM | POA: Diagnosis not present

## 2014-01-02 DIAGNOSIS — M65311 Trigger thumb, right thumb: Secondary | ICD-10-CM | POA: Diagnosis not present

## 2014-01-02 DIAGNOSIS — M1990 Unspecified osteoarthritis, unspecified site: Secondary | ICD-10-CM | POA: Diagnosis not present

## 2014-01-02 DIAGNOSIS — M79641 Pain in right hand: Secondary | ICD-10-CM | POA: Diagnosis not present

## 2014-01-14 DIAGNOSIS — L821 Other seborrheic keratosis: Secondary | ICD-10-CM | POA: Diagnosis not present

## 2014-01-14 DIAGNOSIS — L578 Other skin changes due to chronic exposure to nonionizing radiation: Secondary | ICD-10-CM | POA: Diagnosis not present

## 2014-01-14 DIAGNOSIS — Z1283 Encounter for screening for malignant neoplasm of skin: Secondary | ICD-10-CM | POA: Diagnosis not present

## 2014-01-14 DIAGNOSIS — L905 Scar conditions and fibrosis of skin: Secondary | ICD-10-CM | POA: Diagnosis not present

## 2014-01-14 DIAGNOSIS — L820 Inflamed seborrheic keratosis: Secondary | ICD-10-CM | POA: Diagnosis not present

## 2014-01-15 DIAGNOSIS — R319 Hematuria, unspecified: Secondary | ICD-10-CM | POA: Diagnosis not present

## 2014-01-15 DIAGNOSIS — N390 Urinary tract infection, site not specified: Secondary | ICD-10-CM | POA: Diagnosis not present

## 2014-01-28 DIAGNOSIS — M5416 Radiculopathy, lumbar region: Secondary | ICD-10-CM | POA: Diagnosis not present

## 2014-01-28 DIAGNOSIS — M4806 Spinal stenosis, lumbar region: Secondary | ICD-10-CM | POA: Diagnosis not present

## 2014-01-28 DIAGNOSIS — M4317 Spondylolisthesis, lumbosacral region: Secondary | ICD-10-CM | POA: Diagnosis not present

## 2014-02-24 DIAGNOSIS — M65311 Trigger thumb, right thumb: Secondary | ICD-10-CM | POA: Diagnosis not present

## 2014-02-25 DIAGNOSIS — Z23 Encounter for immunization: Secondary | ICD-10-CM | POA: Diagnosis not present

## 2014-02-25 DIAGNOSIS — M5417 Radiculopathy, lumbosacral region: Secondary | ICD-10-CM | POA: Diagnosis not present

## 2014-02-25 DIAGNOSIS — F411 Generalized anxiety disorder: Secondary | ICD-10-CM | POA: Diagnosis not present

## 2014-02-25 DIAGNOSIS — M1990 Unspecified osteoarthritis, unspecified site: Secondary | ICD-10-CM | POA: Diagnosis not present

## 2014-03-02 ENCOUNTER — Ambulatory Visit: Payer: Self-pay | Admitting: General Practice

## 2014-03-02 DIAGNOSIS — M7071 Other bursitis of hip, right hip: Secondary | ICD-10-CM | POA: Diagnosis not present

## 2014-03-02 DIAGNOSIS — M7072 Other bursitis of hip, left hip: Secondary | ICD-10-CM | POA: Diagnosis not present

## 2014-03-02 DIAGNOSIS — Z01812 Encounter for preprocedural laboratory examination: Secondary | ICD-10-CM | POA: Diagnosis not present

## 2014-03-02 DIAGNOSIS — I10 Essential (primary) hypertension: Secondary | ICD-10-CM | POA: Diagnosis not present

## 2014-03-02 DIAGNOSIS — G4701 Insomnia due to medical condition: Secondary | ICD-10-CM | POA: Diagnosis not present

## 2014-03-02 DIAGNOSIS — M1711 Unilateral primary osteoarthritis, right knee: Secondary | ICD-10-CM | POA: Diagnosis not present

## 2014-03-02 DIAGNOSIS — M65311 Trigger thumb, right thumb: Secondary | ICD-10-CM | POA: Diagnosis not present

## 2014-03-02 DIAGNOSIS — M659 Synovitis and tenosynovitis, unspecified: Secondary | ICD-10-CM | POA: Diagnosis not present

## 2014-03-02 LAB — POTASSIUM: Potassium: 3.9 mmol/L (ref 3.5–5.1)

## 2014-03-04 ENCOUNTER — Ambulatory Visit: Payer: Self-pay | Admitting: General Practice

## 2014-03-04 DIAGNOSIS — Z885 Allergy status to narcotic agent status: Secondary | ICD-10-CM | POA: Diagnosis not present

## 2014-03-04 DIAGNOSIS — M65311 Trigger thumb, right thumb: Secondary | ICD-10-CM | POA: Diagnosis not present

## 2014-03-04 DIAGNOSIS — Z888 Allergy status to other drugs, medicaments and biological substances status: Secondary | ICD-10-CM | POA: Diagnosis not present

## 2014-03-04 DIAGNOSIS — J45909 Unspecified asthma, uncomplicated: Secondary | ICD-10-CM | POA: Diagnosis not present

## 2014-03-04 DIAGNOSIS — I10 Essential (primary) hypertension: Secondary | ICD-10-CM | POA: Diagnosis not present

## 2014-03-04 DIAGNOSIS — M1990 Unspecified osteoarthritis, unspecified site: Secondary | ICD-10-CM | POA: Diagnosis not present

## 2014-03-04 DIAGNOSIS — Z79899 Other long term (current) drug therapy: Secondary | ICD-10-CM | POA: Diagnosis not present

## 2014-03-04 DIAGNOSIS — K219 Gastro-esophageal reflux disease without esophagitis: Secondary | ICD-10-CM | POA: Diagnosis not present

## 2014-03-04 DIAGNOSIS — M659 Synovitis and tenosynovitis, unspecified: Secondary | ICD-10-CM | POA: Diagnosis not present

## 2014-03-04 DIAGNOSIS — G2581 Restless legs syndrome: Secondary | ICD-10-CM | POA: Diagnosis not present

## 2014-03-04 DIAGNOSIS — M797 Fibromyalgia: Secondary | ICD-10-CM | POA: Diagnosis not present

## 2014-03-04 DIAGNOSIS — G43909 Migraine, unspecified, not intractable, without status migrainosus: Secondary | ICD-10-CM | POA: Diagnosis not present

## 2014-03-12 DIAGNOSIS — Z79899 Other long term (current) drug therapy: Secondary | ICD-10-CM | POA: Diagnosis not present

## 2014-03-12 DIAGNOSIS — E559 Vitamin D deficiency, unspecified: Secondary | ICD-10-CM | POA: Diagnosis not present

## 2014-03-12 DIAGNOSIS — M659 Synovitis and tenosynovitis, unspecified: Secondary | ICD-10-CM | POA: Insufficient documentation

## 2014-03-13 DIAGNOSIS — J209 Acute bronchitis, unspecified: Secondary | ICD-10-CM | POA: Diagnosis not present

## 2014-03-19 DIAGNOSIS — M4317 Spondylolisthesis, lumbosacral region: Secondary | ICD-10-CM | POA: Diagnosis not present

## 2014-03-19 DIAGNOSIS — M461 Sacroiliitis, not elsewhere classified: Secondary | ICD-10-CM | POA: Diagnosis not present

## 2014-03-19 DIAGNOSIS — Z981 Arthrodesis status: Secondary | ICD-10-CM | POA: Diagnosis not present

## 2014-03-19 DIAGNOSIS — M47816 Spondylosis without myelopathy or radiculopathy, lumbar region: Secondary | ICD-10-CM | POA: Diagnosis not present

## 2014-03-19 DIAGNOSIS — I10 Essential (primary) hypertension: Secondary | ICD-10-CM | POA: Diagnosis not present

## 2014-03-19 DIAGNOSIS — M4806 Spinal stenosis, lumbar region: Secondary | ICD-10-CM | POA: Diagnosis not present

## 2014-03-19 DIAGNOSIS — Z6836 Body mass index (BMI) 36.0-36.9, adult: Secondary | ICD-10-CM | POA: Diagnosis not present

## 2014-03-19 DIAGNOSIS — M549 Dorsalgia, unspecified: Secondary | ICD-10-CM | POA: Diagnosis not present

## 2014-04-06 DIAGNOSIS — M533 Sacrococcygeal disorders, not elsewhere classified: Secondary | ICD-10-CM | POA: Diagnosis not present

## 2014-04-22 DIAGNOSIS — K219 Gastro-esophageal reflux disease without esophagitis: Secondary | ICD-10-CM | POA: Diagnosis not present

## 2014-04-22 DIAGNOSIS — G4700 Insomnia, unspecified: Secondary | ICD-10-CM | POA: Diagnosis not present

## 2014-04-22 DIAGNOSIS — E041 Nontoxic single thyroid nodule: Secondary | ICD-10-CM | POA: Diagnosis not present

## 2014-04-22 DIAGNOSIS — M797 Fibromyalgia: Secondary | ICD-10-CM | POA: Diagnosis not present

## 2014-05-11 DIAGNOSIS — I10 Essential (primary) hypertension: Secondary | ICD-10-CM | POA: Diagnosis not present

## 2014-05-11 DIAGNOSIS — Z6836 Body mass index (BMI) 36.0-36.9, adult: Secondary | ICD-10-CM | POA: Diagnosis not present

## 2014-05-11 DIAGNOSIS — M461 Sacroiliitis, not elsewhere classified: Secondary | ICD-10-CM | POA: Diagnosis not present

## 2014-05-11 DIAGNOSIS — M533 Sacrococcygeal disorders, not elsewhere classified: Secondary | ICD-10-CM | POA: Diagnosis not present

## 2014-05-18 DIAGNOSIS — L74519 Primary focal hyperhidrosis, unspecified: Secondary | ICD-10-CM | POA: Diagnosis not present

## 2014-05-18 DIAGNOSIS — G4709 Other insomnia: Secondary | ICD-10-CM | POA: Diagnosis not present

## 2014-05-18 DIAGNOSIS — N3001 Acute cystitis with hematuria: Secondary | ICD-10-CM | POA: Diagnosis not present

## 2014-05-29 DIAGNOSIS — M79609 Pain in unspecified limb: Secondary | ICD-10-CM | POA: Diagnosis not present

## 2014-05-29 DIAGNOSIS — I8310 Varicose veins of unspecified lower extremity with inflammation: Secondary | ICD-10-CM | POA: Diagnosis not present

## 2014-05-29 DIAGNOSIS — M7989 Other specified soft tissue disorders: Secondary | ICD-10-CM | POA: Diagnosis not present

## 2014-06-03 DIAGNOSIS — Z13820 Encounter for screening for osteoporosis: Secondary | ICD-10-CM | POA: Diagnosis not present

## 2014-06-03 DIAGNOSIS — Z1389 Encounter for screening for other disorder: Secondary | ICD-10-CM | POA: Diagnosis not present

## 2014-06-03 DIAGNOSIS — Z09 Encounter for follow-up examination after completed treatment for conditions other than malignant neoplasm: Secondary | ICD-10-CM | POA: Diagnosis not present

## 2014-06-03 DIAGNOSIS — Z1239 Encounter for other screening for malignant neoplasm of breast: Secondary | ICD-10-CM | POA: Diagnosis not present

## 2014-06-03 DIAGNOSIS — Z9181 History of falling: Secondary | ICD-10-CM | POA: Diagnosis not present

## 2014-06-03 DIAGNOSIS — R109 Unspecified abdominal pain: Secondary | ICD-10-CM | POA: Diagnosis not present

## 2014-06-04 ENCOUNTER — Ambulatory Visit
Admit: 2014-06-04 | Disposition: A | Discharge status: Home / Self Care | Payer: Self-pay | Attending: Family Medicine | Admitting: Family Medicine

## 2014-06-04 DIAGNOSIS — M25551 Pain in right hip: Secondary | ICD-10-CM | POA: Diagnosis not present

## 2014-06-04 DIAGNOSIS — M1611 Unilateral primary osteoarthritis, right hip: Secondary | ICD-10-CM | POA: Diagnosis not present

## 2014-06-05 DIAGNOSIS — M533 Sacrococcygeal disorders, not elsewhere classified: Secondary | ICD-10-CM | POA: Diagnosis not present

## 2014-06-16 NOTE — Op Note (Signed)
PATIENT NAME:  Jacqueline Lucas, Jacqueline Lucas MR#:  [id number] DATE OF BIRTH:  03-18-1950  DATE OF PROCEDURE:  01/30/2012  PREOPERATIVE DIAGNOSIS: Bilateral ovarian cysts.   POSTOPERATIVE DIAGNOSIS: Bilateral ovarian cysts.   PROCEDURE: Operative laparoscopy and bilateral salpingo-oophorectomy.   SURGEON: Verba Ainley A. Ferne Reus, MD  ANESTHESIA: General.   ESTIMATED BLOOD LOSS: 50 mL.  OPERATIVE FLUIDS: 1 liter.   COMPLICATIONS: None.   FINDINGS: Simple-appearing cyst on right ovary, inflammatory changes of right tube, normal appearing left tube, left ovary adhesed to pelvic sidewall with a small 3 cm cyst.   SPECIMEN: Right tube and ovary, left tube and ovary and left ovarian cyst.   INDICATIONS: Patient is a 64 year old who has had a long-standing history of bilateral ovarian cysts. The cyst had been evaluated on multiple occasions via ultrasound and with CA-125 which were normal. There was no significant change in the sizes of the cyst. Patient did desire definitive surgical evaluation and management. Risks, benefits, indications, and alternatives of the procedure were explained and informed consent was obtained.   PROCEDURE: Patient was taken to the Operating Room with IV fluids running. She was prepped and draped in the usual sterile fashion in [place]. A red rubber catheter was used to drain her bladder and a sponge on a stick was placed inside the vagina for assistance with manipulation given her history of a hysterectomy. Attention was turned to her abdomen where infraumbilical region was injected with 0.5% Sensorcaine. A 5 mm vertical incision was made and the Veress needle was placed. The abdomen was insufflated with CO2 gas. The Veress needle was removed and the abdomen was entered under direct visualization using a #5 mm Xcel trocar. There were severe adhesions of the bowel omentum to the anterior abdominal wall. This, however, did not obstruct visualization of the intended surgical  site. Other findings were as previously stated. The right tube and ovary were grasped and the infundibulopelvic ligament was cauterized with the Kleppinger and successively the right tube and ovary were transected using the harmonic scalpel. Prior to this the simple-appearing cyst was grasped. An opening was made in it using the harmonic scalpel and clear to yellowish simple cyst fluid drained. The specimen was placed in the anterior cul-de-sac. Attention was turned to the patient's left adnexa where the left ovary was found to be adhesed to the left pelvic sidewall and an approximately 3 cm cyst appeared to be somewhat encased in peritoneum. The left ovary was grasped and it was transected in two pieces using the harmonic scalpel. The cyst was removed bluntly using blunt dissection with dissecting graspers. The left tube and ovary pieces as well as the cyst were removed via the 11 mm ports that had been placed prior to proceeding with surgery. An Endo Catch bag was placed in the left 11 mm port and the right tube and ovary were placed in the bag and the specimen was removed. The area was irrigated with copious amounts of warm normal saline. All surgical areas were found to be hemostatic. Aristo was placed for further hemostasis. All instrumentation was then removed from the patient's abdomen. The skin of the 11 mm incisions were closed with a 4-0 Vicryl and the skin of the 5 mm infraumbilical incision was closed with Dermabond. Patient tolerated procedure well. Sponge, needle and instrument counts were correct x2 and the patient was awakened from anesthesia and taken to recovery room in stable condition.    ____________________________ Rolm Gala Ferne Reus, MD law:cms D: 01/30/2012 15:33:09 ET  T: 01/30/2012 16:00:04 ET JOB#: [id number]  cc: Sherlynn Carbon A. Ferne Reus, MD, <Dictator>  Rolm Gala WEAVER LEE MD ELECTRONICALLY SIGNED 01/31/2012 8:03

## 2014-06-20 NOTE — Op Note (Signed)
PATIENT NAME:  Jacqueline Lucas, Jacqueline Lucas MR#:  [id number] DATE OF BIRTH:  Mar 19, 1950  DATE OF PROCEDURE:  07/16/2013  PREOPERATIVE DIAGNOSIS: Degenerative arthrosis of the left knee.   POSTOPERATIVE DIAGNOSIS: Degenerative arthrosis of the left knee.   PROCEDURE PERFORMED:  Left total knee arthroplasty using computer-assisted navigation.   SURGEON: Dr. Skip Estimable.  ASSISTANT:  Vance Peper, PA (required to maintain retraction throughout the procedure).   ANESTHESIA: Spinal.   ESTIMATED BLOOD LOSS: 50 mL.   FLUIDS REPLACED: 1600 mL of crystalloid.  TOURNIQUET TIME:  72 minutes.   DRAINS: Two medium drains to reinfusion system.  SOFT TISSUE RELEASES: Anterior cruciate ligament, posterior cruciate ligament, deep medial collateral ligament, patellofemoral ligament.   IMPLANTS UTILIZED:  DePuy PFC Sigma size 3 posterior stabilized femoral component (cemented), size 3 MBT tibial component (cemented), 35 mm 3-peg oval dome patella (cemented) and a 10 mm stabilized rotating platform polyethylene insert. Gentamicin bone cement was utilized.  INDICATIONS FOR SURGERY: The patient is a 64 year old female who has been seen for complaints of progressive left knee pain. X-rays demonstrated significant degenerative changes in tricompartmental fashion. After discussion of the risks and benefits of surgical intervention, the patient expressed understanding of the risks, benefits and agreed with plans for surgical intervention.   PROCEDURE IN DETAIL: The patient was brought to the operating room,  and after adequate spinal anesthesia was achieved, a tourniquet was placed on the patient's upper left thigh. The patient's left knee and leg were cleaned and prepped with alcohol and DuraPrep, draped in the usual sterile fashion. A "timeout" was performed as per usual protocol.  The left lower extremity was exsanguinated using an Esmarch, and the tourniquet was inflated to 300 mmHg. An anterior longitudinal incision  was made followed by a standard mid vastus approach. A small effusion was evacuated. The deep fibers of the medial collateral ligament were elevated in a subperiosteal fashion off the medial flare of the tibia so as to maintain a continuous soft tissue sleeve. The patella was subluxed laterally and the patellofemoral ligament was incised.  Inspection of the knee demonstrates significant degenerative changes in a tricompartmental fashion. Osteophytes were debrided using a rongeur. Anterior and posterior cruciate ligaments were excised. Two 4.0 mm Schanz pins were inserted into the femur and into the tibia for attachment of the array of trackers used for computer-assisted navigation. Hip center was identified using a circumduction technique. Distal landmarks were mapped using the computer. The distal femur and proximal tibia were mapped using the computer. Distal femoral cutting guide was positioned using computer-assisted navigation so as to achieve 5 degree distal valgus cut. Cut was performed and verified using the computer. Distal femur was sized and it was felt to be a size 3 femoral component was appropriate. A size 3 cutting guide was positioned and the anterior cut was performed and verified using the computer. This was followed by completion of the posterior and chamfer cuts. Femoral cutting guide for the central box was then positioned and the central box cut was performed.  Attention was then directed to the proximal tibia. Medial and lateral menisci were excised. The extramedullary tibial cutting guide was positioned using computer-assisted navigation so as to achieve 0 degree varus valgus alignment and 0 degree posterior slope. Cut was performed and verified using the computer. A proximal tibia was sized and it was felt that a size 3 tibial tray was appropriate. Tibial and femoral trials were inserted followed by insertion of a 10 mm polyethylene insert. Excellent mediolateral  soft tissue balance was  appreciated both in full extension and in flexion.  Finally, the patella was cut and prepared so as to accommodate a 35 mm 3-peg oval dome patella. Patellar trial was placed and the knee was placed through range of motion with excellent patellar tracking appreciated. Femoral trial was removed. Central post hole for the tibial component was reamed followed by insertion of a keel punch. Tibial trial was then removed. Cut surfaces of bone were irrigated with copious amounts of normal saline with antibiotic solution using pulsatile lavage and then suctioned dry. Polymethyl methacrylate cement with gentamicin was mixed in the usual fashion using a vacuum mixer. Cement was applied to the cut surface of the cut surface of the femur, as well as along the posterior flanges of a size 3 posterior stabilized femoral component. Femoral component was positioned and impacted into place. Excess cement was removed using [employer] fast streamer. A 10 mm polyethylene trial was inserted and the knee was brought into full extension with steady axial compression applied. Finally, cement was applied to the undersurface of a 35 mm 3-peg oval dome patella, and the patellar component was positioned and patellar clamp applied. Excess cement was removed using [employer] fast streamer.   After adequate curing of cement, the tourniquet was deflated after a total tourniquet time of 72 minutes. Hemostasis was achieved using electrocautery. The knee was irrigated with copious amounts of normal saline with antibiotic solution using pulsatile lavage and then suctioned dry. The knee was inspected for any residual cement debris; 20 mL of 1.3% Exparel and 40 mL of normal saline was injected along the posterior capsule, medial and lateral gutters and on the arthrotomy site. A 10 mm stabilized rotating platform polyethylene insert was inserted and the knee was placed through range of motion. Excellent patellar tracking was noted and excellent mediolateral soft tissue  balance was appreciated both in extension and in flexion. Two medium drains were placed in the wound bed and brought out through a separate stab incision to be attached to a reinfusion system. The medial parapatellar portion of the incision was reapproximated using interrupted sutures of #1 Vicryl. Subcutaneous tissue was approximated in layers using first #0 Vicryl followed by #2-0 Vicryl. Skin was closed with skin staples after injecting of 30 mL of 0.25% Marcaine with epinephrine and subcutaneous tissue adjacent to the incision line. Sterile dressing was applied followed by application of Polar Care pad.   The patient tolerated the procedure well. She was transported to the recovery room in stable condition.   ____________________________ Laurice Record. Holley Bouche., MD jph:ce D: 07/16/2013 15:32:42 ET T: 07/16/2013 18:51:43 ET JOB#: [id number]  cc: Jeneen Rinks P. Holley Bouche., MD, <Dictator> JAMES P Holley Bouche MD ELECTRONICALLY SIGNED 07/21/2013 6:53

## 2014-06-20 NOTE — Discharge Summary (Signed)
PATIENT NAME:  Jacqueline Lucas, Jacqueline Lucas MR#:  [id number] DATE OF BIRTH:  1950-11-17  DATE OF ADMISSION:  07/16/2013 DATE OF DISCHARGE: 07/19/2013   ADMITTING DIAGNOSIS: Degenerative arthrosis of the left knee.   DISCHARGE DIAGNOSIS: Degenerative arthrosis of the left knee.   HISTORY: The patient is a 64 year old, who has been followed at [facility] for progression of left knee pain. She had reported the left knee giving away as well as pain with weight-bearing activities. She had localized most of the pain along the medial aspect of the knee. She had also reported some anterior knee pain. She reported some swelling as well. She had noticed some increased progression in her decreased range of motion. The patient had undergone a course of Synvisc injections to both knees, but did not see any significant improvement in her condition. She states that the pain had progressed to the point that it was significantly interfering with her activities of daily living. X-rays taken in [place] showed narrowing of the medial cartilage space with associated varus alignment. There was some small osteophyte formation as well as subchondral sclerosis. After discussion of the risks and benefits of surgical intervention, the patient expressed her understanding of the risks and benefits and agreed for plans for surgical intervention.   PROCEDURE: Left total knee arthroplasty using computer-assisted navigation.   ANESTHESIA: Spinal.   SOFT TISSUE RELEASE: Anterior cruciate ligament, posterior cruciate ligament, deep medial collateral ligaments, as well as patellofemoral ligament.   IMPLANTS UTILIZED: DePuy PFC Sigma size 3 posterior stabilized femoral component (cemented), size 3 MBT tibial component (cemented), 35 mm 3-peg oval dome patella (cemented), and a 10 mm stabilized rotating platform polyethylene insert. Gentamicin bone cement was utilized.   HOSPITAL COURSE: The patient tolerated the  procedure very well. She had no complications. She was then taken to the PACU where she was stabilized and then transferred to the orthopedic floor. She began receiving anticoagulation therapy of Lovenox 30 mg subcutaneous q.12 hours for per anesthesia and pharmacy protocol. She was fitted with TED stockings bilaterally. These are allowed to be removed 1 hour per 8 hour shift. The left one was applied on day 2 following removal of the Hemovac and dressing change. The patient was also fitted with AVI compression foot pumps bilaterally set at 80 mmHg. Her calves have been nontender. There has been no evidence of any DVTs. Negative Homans sign. Heels were elevated off the bed using rolled towels. The patient was also fitted with a specialty band because of her severe fibromyalgia.  She has done extremely well with this. She states that this has helped to manage with all of her pain.   The patient has denied any chest pain or shortness of breath. Vital signs have been stable. She has been afebrile. Hemodynamically she was stable and no transfusions were given other than the Autovac transfusion given the first 6 hours postoperatively.   Physical therapy was initiated on day 1 for gait training and transfers. She has done very well. Upon being discharged, he was ambulating greater than 200 feet. She was independent with bed to chair transfers. Occupational therapy was also initiated on day 1 for ADLs and assistive devices.   The patient's IV, Foley and Hemovac were discontinued on day 2 along with the Polar Care. The Polar Care  was reapplied to the surgical leg, maintaining a temperature of 40 to 50 degrees Fahrenheit.   DISPOSITION: The patient is being discharged to home in improved stable condition.  DISCHARGE INSTRUCTIONS: She will continue weight-bearing as tolerated. Continue using a rolling walker until cleared by physical therapy to go to a quad cane. She will receive home health PT. Continue to  elevate lower extremities. Continue with TED stockings bilaterally. These are allowed to be removed at night, but are to be worn during the day. She is to wear these on both legs. Encouraged the patient to continue with incentive spirometer q.1 while awake as well as encourage cough and deep breathing q.2 hours while awake. She is placed on a regular diet. Continue the Polar Care maintaining a temperature of 40 to 50 degrees Fahrenheit.  She is to call the clinic if any temperatures of 101.5 or greater or excessive bleeding.  She has a followup appointment on 06/04 at 10:00 p.m.   DRUG ALLERGIES: SALICYLATES, VALIUM AND ADHESIVES.   MEDICATIONS: The patient may resume her regular medications that she was on prior to admission. She was given a prescription for Lovenox 40 mg subcutaneously daily for 14 days, then discontinue and begin taking one 81 mg enteric-coated aspirin. Also a prescription for oxycodone 5 to 10 mg every 4 to 6 hours p.r.n. for pain and Tramadol 50 to 100 mg every 4 to 6 hours p.r.n. for pain.   PAST MEDICAL HISTORY:  1.  Hyperthyroidism. 2.  History of shingles. 3.  Hypertension. 4.  Hypercholesterolemia. 5.  Restless leg syndrome. 6.  Anxiety. 7.  Migraines.  8.  Fibromyalgia.  9.  Gastroesophageal reflux disease. 10. Insomnia. 11. Colonic polyps. 12. Chronic nausea and vomiting. 13. Asthmatic bronchitis. 14. Obesity. 15. Palpitations. 16. Chronic back pain with spinal stenosis.   ____________________________ Vance Peper, PA jrw:aw D: 07/18/2013 07:56:49 ET T: 07/18/2013 09:27:03 ET JOB#: [id number]  cc: Vance Peper, PA, <Dictator> Zarian Colpitts PA ELECTRONICALLY SIGNED 07/22/2013 14:26

## 2014-06-28 NOTE — Op Note (Signed)
PATIENT NAME:  Jacqueline Lucas, Jacqueline Lucas MR#:  [id number] DATE OF BIRTH:  1951/02/07  DATE OF PROCEDURE:  03/04/2014   PREOPERATIVE DIAGNOSIS: Stenosing tenosynovitis of the right thumb (trigger thumb).   POSTOPERATIVE DIAGNOSIS: Stenosing tenosynovitis of the right thumb (trigger thumb).   PROCEDURE PERFORMED: Right trigger thumb release.   SURGEON: Laurice Record. Holley Bouche., MD  ANESTHESIA: General.   ESTIMATED BLOOD LOSS: Minimal.   TOURNIQUET TIME: 12 minutes.   DRAINS: None.   INDICATIONS FOR SURGERY: The patient is a 64 year old right-hand-dominant female who has been seen for complaints of catching and triggering of the right thumb. She localizes most of the pain along the area of the A1 pulley of the right thumb. She did not see any significant improvement despite conservative nonsurgical intervention. After discussion of the risks and benefits of surgical intervention, the patient expressed understanding of the risks and benefits, and agreed with plans for surgical intervention.   PROCEDURE IN DETAIL: The patient was brought to the operating room and after adequate general anesthesia was achieved, a tourniquet was placed on the patient's upper right arm. The patient's right hand and arm were cleaned and prepped with alcohol and DuraPrep, draped in the usual sterile fashion. A "timeout" was performed as per usual protocol. The right upper extremity was exsanguinated using an Esmarch, and the tourniquet was inflated to 250 mmHg. Loupe magnification was used throughout the procedure. A transverse incision was made in line with the skin crease at the base of the thumb. Blunt dissection was carried down to the A1 pulley. The A1 pulley was sharply incised using a scalpel, with complete release of the A1 pulley appreciated. A moderate amount of tenosynovial fluid was evacuated. The thumb was placed through a range of motion with smooth excursion of the tendon. The wound was irrigated with copious amounts  of normal saline with antibiotic solution. Skin edges were reapproximated using #5-0 nylon, 5 mL of 0.25% Marcaine was injected along the incision site. A sterile dressing was applied. The tourniquet was deflated after a total tourniquet time of 12 minutes.   The patient tolerated the procedure well. She was transported to the recovery room in stable condition.   ____________________________ Laurice Record. Holley Bouche., MD jph:mw D: 03/05/2014 06:16:03 ET T: 03/05/2014 11:00:47 ET JOB#: [id number]  cc: Jeneen Rinks P. Holley Bouche., MD, <Dictator> JAMES P Holley Bouche MD ELECTRONICALLY SIGNED 03/16/2014 0:49

## 2014-07-15 DIAGNOSIS — M533 Sacrococcygeal disorders, not elsewhere classified: Secondary | ICD-10-CM | POA: Diagnosis not present

## 2014-07-15 DIAGNOSIS — M47816 Spondylosis without myelopathy or radiculopathy, lumbar region: Secondary | ICD-10-CM | POA: Diagnosis not present

## 2014-07-15 DIAGNOSIS — Z6836 Body mass index (BMI) 36.0-36.9, adult: Secondary | ICD-10-CM | POA: Diagnosis not present

## 2014-07-17 DIAGNOSIS — M79609 Pain in unspecified limb: Secondary | ICD-10-CM | POA: Diagnosis not present

## 2014-07-17 DIAGNOSIS — M7989 Other specified soft tissue disorders: Secondary | ICD-10-CM | POA: Diagnosis not present

## 2014-07-17 DIAGNOSIS — I8311 Varicose veins of right lower extremity with inflammation: Secondary | ICD-10-CM | POA: Diagnosis not present

## 2014-07-17 DIAGNOSIS — I8310 Varicose veins of unspecified lower extremity with inflammation: Secondary | ICD-10-CM | POA: Diagnosis not present

## 2014-07-21 DIAGNOSIS — N390 Urinary tract infection, site not specified: Secondary | ICD-10-CM | POA: Diagnosis not present

## 2014-07-21 DIAGNOSIS — R310 Gross hematuria: Secondary | ICD-10-CM | POA: Diagnosis not present

## 2014-07-28 DIAGNOSIS — M1611 Unilateral primary osteoarthritis, right hip: Secondary | ICD-10-CM | POA: Diagnosis not present

## 2014-07-28 DIAGNOSIS — M25551 Pain in right hip: Secondary | ICD-10-CM | POA: Diagnosis not present

## 2014-08-03 DIAGNOSIS — G4700 Insomnia, unspecified: Secondary | ICD-10-CM | POA: Diagnosis not present

## 2014-08-03 DIAGNOSIS — R5381 Other malaise: Secondary | ICD-10-CM | POA: Diagnosis not present

## 2014-08-03 DIAGNOSIS — M62838 Other muscle spasm: Secondary | ICD-10-CM | POA: Diagnosis not present

## 2014-08-03 DIAGNOSIS — E559 Vitamin D deficiency, unspecified: Secondary | ICD-10-CM | POA: Diagnosis not present

## 2014-08-03 DIAGNOSIS — M797 Fibromyalgia: Secondary | ICD-10-CM | POA: Diagnosis not present

## 2014-08-03 DIAGNOSIS — Z79899 Other long term (current) drug therapy: Secondary | ICD-10-CM | POA: Diagnosis not present

## 2014-08-13 DIAGNOSIS — M1611 Unilateral primary osteoarthritis, right hip: Secondary | ICD-10-CM | POA: Diagnosis not present

## 2014-08-16 ENCOUNTER — Emergency Department: Payer: Medicare Other

## 2014-08-16 ENCOUNTER — Emergency Department
Admission: EM | Admit: 2014-08-16 | Discharge: 2014-08-16 | Disposition: A | Discharge status: Home / Self Care | Payer: Medicare Other | Attending: Emergency Medicine | Admitting: Emergency Medicine

## 2014-08-16 DIAGNOSIS — W010XXA Fall on same level from slipping, tripping and stumbling without subsequent striking against object, initial encounter: Secondary | ICD-10-CM | POA: Diagnosis not present

## 2014-08-16 DIAGNOSIS — Y9222 Religious institution as the place of occurrence of the external cause: Secondary | ICD-10-CM | POA: Diagnosis not present

## 2014-08-16 DIAGNOSIS — S4991XA Unspecified injury of right shoulder and upper arm, initial encounter: Secondary | ICD-10-CM | POA: Diagnosis present

## 2014-08-16 DIAGNOSIS — S42251A Displaced fracture of greater tuberosity of right humerus, initial encounter for closed fracture: Secondary | ICD-10-CM | POA: Diagnosis not present

## 2014-08-16 DIAGNOSIS — S42201A Unspecified fracture of upper end of right humerus, initial encounter for closed fracture: Secondary | ICD-10-CM | POA: Insufficient documentation

## 2014-08-16 DIAGNOSIS — Y998 Other external cause status: Secondary | ICD-10-CM | POA: Diagnosis not present

## 2014-08-16 DIAGNOSIS — Y9301 Activity, walking, marching and hiking: Secondary | ICD-10-CM | POA: Diagnosis not present

## 2014-08-16 DIAGNOSIS — I10 Essential (primary) hypertension: Secondary | ICD-10-CM | POA: Diagnosis not present

## 2014-08-16 DIAGNOSIS — M25511 Pain in right shoulder: Secondary | ICD-10-CM | POA: Diagnosis not present

## 2014-08-16 DIAGNOSIS — S42301A Unspecified fracture of shaft of humerus, right arm, initial encounter for closed fracture: Secondary | ICD-10-CM

## 2014-08-16 DIAGNOSIS — W19XXXA Unspecified fall, initial encounter: Secondary | ICD-10-CM | POA: Diagnosis not present

## 2014-08-16 HISTORY — DX: Essential (primary) hypertension: I10

## 2014-08-16 LAB — BASIC METABOLIC PANEL
Anion gap: 9 (ref 5–15)
BUN: 25 mg/dL — ABNORMAL HIGH (ref 6–20)
CO2: 24 mmol/L (ref 22–32)
Calcium: 8.7 mg/dL — ABNORMAL LOW (ref 8.9–10.3)
Chloride: 106 mmol/L (ref 101–111)
Creatinine, Ser: 0.76 mg/dL (ref 0.44–1.00)
GFR calc Af Amer: >60 mL/min (ref >60)
GFR calc non Af Amer: >60 mL/min (ref >60)
Glucose, Bld: 115 mg/dL — ABNORMAL HIGH (ref 65–99)
Potassium: 3.8 mmol/L (ref 3.5–5.1)
Sodium: 139 mmol/L (ref 135–145)

## 2014-08-16 LAB — CBC
HCT: 39 % (ref 35.0–47.0)
Hemoglobin: 12.6 g/dL (ref 12.0–16.0)
MCH: 28 pg (ref 26.0–34.0)
MCHC: 32.4 g/dL (ref 32.0–36.0)
MCV: 86.4 fL (ref 80.0–100.0)
Platelets: 277 10*3/uL (ref 150–440)
RBC: 4.51 MIL/uL (ref 3.80–5.20)
RDW: 14.8 % — ABNORMAL HIGH (ref 11.5–14.5)
WBC: 9.3 10*3/uL (ref 3.6–11.0)

## 2014-08-16 MED ORDER — ONDANSETRON HCL 4 MG/2ML IJ SOLN
INTRAMUSCULAR | Status: AC
  Filled 2014-08-16: qty 2

## 2014-08-16 MED ORDER — SODIUM CHLORIDE 0.9 % IV BOLUS (SEPSIS)
1000.0000 mL | Freq: Once | INTRAVENOUS | Status: AC
  Administered 2014-08-16: 1000 mL via INTRAVENOUS

## 2014-08-16 MED ORDER — OXYCODONE-ACETAMINOPHEN 5-325 MG PO TABS
ORAL_TABLET | ORAL | Status: AC
  Filled 2014-08-16: qty 2

## 2014-08-16 MED ORDER — OXYCODONE-ACETAMINOPHEN 5-325 MG PO TABS: 1.0000 | ORAL_TABLET | Freq: Four times a day (QID) | ORAL | Status: DC | PRN

## 2014-08-16 MED ORDER — ONDANSETRON 4 MG PO TBDP: 4.0000 mg | ORAL_TABLET | Freq: Three times a day (TID) | ORAL | Status: DC | PRN

## 2014-08-16 MED ORDER — ONDANSETRON HCL 4 MG/2ML IJ SOLN
4.0000 mg | Freq: Once | INTRAMUSCULAR | Status: AC
  Administered 2014-08-16: 4 mg via INTRAVENOUS

## 2014-08-16 MED ORDER — OXYCODONE-ACETAMINOPHEN 5-325 MG PO TABS
2.0000 | ORAL_TABLET | Freq: Once | ORAL | Status: AC
  Administered 2014-08-16: 2 via ORAL

## 2014-08-16 MED ORDER — FENTANYL CITRATE (PF) 100 MCG/2ML IJ SOLN
50.0000 ug | Freq: Once | INTRAMUSCULAR | Status: AC
  Administered 2014-08-16: 50 ug via INTRAVENOUS

## 2014-08-16 MED ORDER — FENTANYL CITRATE (PF) 100 MCG/2ML IJ SOLN
INTRAMUSCULAR | Status: AC
  Filled 2014-08-16: qty 2

## 2014-08-16 NOTE — ED Notes (Signed)
Pt fell pta while at church. States tripped over shoes while carrying child. Also states dizzy x3 days w/ diarrhea. Right shoulder pain post fall.

## 2014-08-16 NOTE — ED Provider Notes (Signed)
[facility] Emergency Department Provider Note  Time seen: 2:52 PM  I have reviewed the triage vital signs and the nursing notes.   HISTORY  Chief Complaint Fall    HPI Jacqueline Lucas is a 64 y.o. female with a past medical history of hypertension who states she was walking out of church today when she tripped falling on her right shoulder. Patient states she was carrying her great-granddaughter, so when she fell she turned to protect the child and landed straight on her shoulder. She states immediate pain to the shoulder after falling. Denies any numbness or coldness. Denies hitting her head or losing consciousness. Denies any other injuries or complaints of pain at this time.The patient does state as a secondary complaint that she has been having on and off diarrhea for the past 3 days and has been getting intermittently lightheaded or dizzy. She states days fall was not due to dizziness, she tripped on the carpet and had a mechanical fall. In eyes any chest pain, shortness breath, nausea, diaphoresis or any time.    Past Medical History  Diagnosis Date  . Hypertension     There are no active problems to display for this patient.   No past surgical history on file.  No current outpatient prescriptions on file.  Allergies Review of patient's allergies indicates no known allergies.  History reviewed. No pertinent family history.  Social History History  Substance Use Topics  . Smoking status: Not on file  . Smokeless tobacco: Never Used  . Alcohol Use: No    Review of Systems Constitutional: Negative for fever. Cardiovascular: Negative for chest pain. Respiratory: Negative for shortness of breath. Gastrointestinal: Negative for abdominal pain Musculoskeletal: Negative for back pain. Negative for neck pain. Positive for right shoulder pain. Neurological: Negative for headache  10-point ROS otherwise  negative.  ____________________________________________   PHYSICAL EXAM:  VITAL SIGNS: ED Triage Vitals  Enc Vitals Group     BP 08/16/14 1418 115/77 mmHg     Pulse Rate 08/16/14 1418 81     Resp --      Temp 08/16/14 1418 98.2 F (36.8 C)     Temp Source 08/16/14 1418 Oral     SpO2 08/16/14 1418 92 %     Weight 08/16/14 1418 216 lb (97.977 kg)     Height 08/16/14 1418 5\' 4"  (1.626 m)     Head Cir --      Peak Flow --      Pain Score 08/16/14 1419 8     Pain Loc --      Pain Edu? --      Excl. in [place]? --     Constitutional: Alert and oriented. Mild distress due to right shoulder pain. Eyes: Normal exam ENT   Head: Normocephalic and atraumatic. Cardiovascular: Normal rate, regular rhythm. No murmur Respiratory: Normal respiratory effort without tachypnea nor retractions. Breath sounds are clear  Gastrointestinal: Soft and nontender. No distention.   Musculoskeletal: Significant tenderness to palpation of the right shoulder. Unable to move her shoulder due to pain. Elbow appears normal, wrist appears normal, 2+ radial pulse palpated. Sensation is intact distally. No other extremity injuries identified. Neurologic:  Normal speech and language. No gross focal neurologic deficits Skin:  Skin is warm, dry and intact.  Psychiatric: Mood and affect are normal. Speech and behavior are normal.  ____________________________________________   RADIOLOGY  X-ray consistent with proximal humerus metaphysis fracture no dislocation.  ____________________________________________    INITIAL IMPRESSION /  ASSESSMENT AND PLAN / ED COURSE  Pertinent labs & imaging results that were available during my care of the patient were reviewed by me and considered in my medical decision making (see chart for details).  Patient has suffered a proximal humerus fracture. We will control pain in the emergency department, and placed in a shoulder immobilizer and have the patient follow up with  orthopedics. Given the patient's complaint of on and off diarrhea for the past 2-3 days, we will check labs and IV hydrate in the emergency department.  ____________________________________________   FINAL CLINICAL IMPRESSION(S) / ED DIAGNOSES  Proximal humerus fracture.   Harvest Dark, MD 08/17/14 [id number]

## 2014-08-18 ENCOUNTER — Other Ambulatory Visit: Payer: Self-pay | Admitting: Physician Assistant

## 2014-08-18 DIAGNOSIS — S42221A 2-part displaced fracture of surgical neck of right humerus, initial encounter for closed fracture: Secondary | ICD-10-CM

## 2014-08-19 ENCOUNTER — Ambulatory Visit
Admission: RE | Admit: 2014-08-19 | Discharge: 2014-08-19 | Disposition: A | Discharge status: Home / Self Care | Payer: Medicare Other | Source: Ambulatory Visit | Attending: Physician Assistant | Admitting: Physician Assistant

## 2014-08-19 DIAGNOSIS — Z09 Encounter for follow-up examination after completed treatment for conditions other than malignant neoplasm: Secondary | ICD-10-CM | POA: Insufficient documentation

## 2014-08-19 DIAGNOSIS — R937 Abnormal findings on diagnostic imaging of other parts of musculoskeletal system: Secondary | ICD-10-CM | POA: Diagnosis not present

## 2014-08-19 DIAGNOSIS — S42221A 2-part displaced fracture of surgical neck of right humerus, initial encounter for closed fracture: Secondary | ICD-10-CM | POA: Diagnosis not present

## 2014-08-19 DIAGNOSIS — S42251A Displaced fracture of greater tuberosity of right humerus, initial encounter for closed fracture: Secondary | ICD-10-CM | POA: Diagnosis not present

## 2014-08-24 DIAGNOSIS — S42231A 3-part fracture of surgical neck of right humerus, initial encounter for closed fracture: Secondary | ICD-10-CM | POA: Diagnosis not present

## 2014-08-26 ENCOUNTER — Telehealth: Payer: Self-pay | Admitting: Family Medicine

## 2014-08-26 NOTE — Telephone Encounter (Signed)
Requesting refill on Valsartn HCT 160/12.5mg . Please send to [facility] order.

## 2014-08-27 MED ORDER — VALSARTAN-HYDROCHLOROTHIAZIDE 160-12.5 MG PO TABS: 1.0000 | ORAL_TABLET | Freq: Every day | ORAL | Status: DC

## 2014-08-29 ENCOUNTER — Other Ambulatory Visit: Payer: Self-pay | Admitting: Family Medicine

## 2014-08-30 ENCOUNTER — Other Ambulatory Visit: Payer: Self-pay | Admitting: Family Medicine

## 2014-09-01 ENCOUNTER — Telehealth: Payer: Self-pay | Admitting: Family Medicine

## 2014-09-01 NOTE — Telephone Encounter (Signed)
PT IS NEEDING CLONZAPAM .5 MG 90 DAY SUPPLY TO [facility] [address]. PLEASE CALL AT [phone]

## 2014-09-01 NOTE — Telephone Encounter (Signed)
ERRENOUS °

## 2014-09-02 ENCOUNTER — Other Ambulatory Visit: Payer: Self-pay

## 2014-09-02 DIAGNOSIS — F419 Anxiety disorder, unspecified: Secondary | ICD-10-CM

## 2014-09-02 NOTE — Telephone Encounter (Signed)
Patient called yesterday afternoon requesting a refill of this medication. Patient states that she is completely out.

## 2014-09-03 MED ORDER — CLONAZEPAM 0.5 MG PO TABS: 0.5000 mg | ORAL_TABLET | Freq: Two times a day (BID) | ORAL | Status: DC

## 2014-09-03 NOTE — Telephone Encounter (Signed)
Per Dr. Ashok Norris rx was approved and printed for pick up. Patient will be informed.

## 2014-09-07 ENCOUNTER — Ambulatory Visit (INDEPENDENT_AMBULATORY_CARE_PROVIDER_SITE_OTHER): Payer: Medicare Other | Admitting: Family Medicine

## 2014-09-07 ENCOUNTER — Encounter: Payer: Self-pay | Admitting: Family Medicine

## 2014-09-07 VITALS — BP 112/70 | HR 90 | Temp 97.6°F | Resp 18 | Ht 64.0 in | Wt 216.2 lb

## 2014-09-07 DIAGNOSIS — K635 Polyp of colon: Secondary | ICD-10-CM | POA: Insufficient documentation

## 2014-09-07 DIAGNOSIS — K649 Unspecified hemorrhoids: Secondary | ICD-10-CM | POA: Insufficient documentation

## 2014-09-07 DIAGNOSIS — F419 Anxiety disorder, unspecified: Secondary | ICD-10-CM | POA: Diagnosis not present

## 2014-09-07 DIAGNOSIS — K2100 Gastro-esophageal reflux disease with esophagitis, without bleeding: Secondary | ICD-10-CM

## 2014-09-07 DIAGNOSIS — T7840XA Allergy, unspecified, initial encounter: Secondary | ICD-10-CM | POA: Insufficient documentation

## 2014-09-07 DIAGNOSIS — I209 Angina pectoris, unspecified: Secondary | ICD-10-CM | POA: Diagnosis not present

## 2014-09-07 DIAGNOSIS — G8929 Other chronic pain: Secondary | ICD-10-CM | POA: Diagnosis not present

## 2014-09-07 DIAGNOSIS — Z8619 Personal history of other infectious and parasitic diseases: Secondary | ICD-10-CM | POA: Insufficient documentation

## 2014-09-07 DIAGNOSIS — E162 Hypoglycemia, unspecified: Secondary | ICD-10-CM

## 2014-09-07 DIAGNOSIS — R739 Hyperglycemia, unspecified: Secondary | ICD-10-CM | POA: Diagnosis not present

## 2014-09-07 DIAGNOSIS — I10 Essential (primary) hypertension: Secondary | ICD-10-CM | POA: Diagnosis not present

## 2014-09-07 DIAGNOSIS — J4540 Moderate persistent asthma, uncomplicated: Secondary | ICD-10-CM

## 2014-09-07 LAB — GLUCOSE, POCT (MANUAL RESULT ENTRY): POC Glucose: 88 mg/dl (ref 70–99)

## 2014-09-07 LAB — POCT GLYCOSYLATED HEMOGLOBIN (HGB A1C): Hemoglobin A1C: 6.3

## 2014-09-07 MED ORDER — OXYCODONE-ACETAMINOPHEN 5-325 MG PO TABS: 1.0000 | ORAL_TABLET | Freq: Four times a day (QID) | ORAL | Status: DC | PRN

## 2014-09-07 MED ORDER — MONTELUKAST SODIUM 10 MG PO TABS: 10.0000 mg | ORAL_TABLET | Freq: Every day | ORAL | Status: DC

## 2014-09-07 MED ORDER — CLONAZEPAM 0.5 MG PO TABS: 0.5000 mg | ORAL_TABLET | Freq: Two times a day (BID) | ORAL | Status: DC

## 2014-09-07 NOTE — Progress Notes (Signed)
Name: Jacqueline Lucas   MRN: [id number]    DOB: 1950-12-11   Date:09/07/2014       Progress Note  Subjective  Chief Complaint  Chief Complaint  Patient presents with  . Hypertension    Bp was 74/48 when patient fell 3 weeks ago   . Hyperlipidemia  . Headache  . Anxiety    Hypertension This is a chronic problem. The current episode started more than 1 year ago. The problem is unchanged. The problem is controlled. Associated symptoms include anxiety, headaches, neck pain and shortness of breath. Pertinent negatives include no blurred vision, chest pain, orthopnea or palpitations. There are no associated agents to hypertension. Risk factors for coronary artery disease include dyslipidemia, obesity, post-menopausal state, sedentary lifestyle and stress. Past treatments include angiotensin blockers and diuretics. The current treatment provides moderate improvement. There are no compliance problems.  Hypertensive end-organ damage includes angina.  Hyperlipidemia This is a chronic problem. The current episode started more than 1 year ago. The problem is controlled. Recent lipid tests were reviewed and are normal. Exacerbating diseases include obesity. Factors aggravating her hyperlipidemia include fatty foods. Associated symptoms include myalgias and shortness of breath. Pertinent negatives include no chest pain or focal weakness. Current antihyperlipidemic treatment includes statins. The current treatment provides moderate improvement of lipids. There are no compliance problems.  Risk factors for coronary artery disease include diabetes mellitus, dyslipidemia, hypertension, obesity, post-menopausal, a sedentary lifestyle and stress.  Headache  This is a chronic problem. The current episode started more than 1 year ago. The problem occurs daily. The problem has been unchanged. The pain is located in the right unilateral region. The pain radiates to the right neck. The pain quality is similar to  prior headaches. The quality of the pain is described as throbbing. The pain is moderate. Associated symptoms include back pain, nausea, neck pain and vomiting. Pertinent negatives include no blurred vision, coughing, dizziness, eye redness, fever, hearing loss, insomnia, seizures, sore throat, tingling, tinnitus, weakness or weight loss. The symptoms are aggravated by activity, fatigue, emotional stress, noise, weather changes and Valsalva. She has tried oral narcotics, NSAIDs, ergotamines and antidepressants for the symptoms. The treatment provided mild relief. Her past medical history is significant for hypertension and obesity.  Anxiety Presents for follow-up visit. Symptoms include compulsions, dry mouth, excessive worry, hyperventilation, irritability, nausea, nervous/anxious behavior, restlessness and shortness of breath. Patient reports no chest pain, dizziness, insomnia, palpitations or suicidal ideas. Symptoms occur occasionally. The severity of symptoms is severe. The symptoms are aggravated by family issues and social activities. The quality of sleep is fair. Nighttime awakenings: occasional.   Her past medical history is significant for asthma. Past treatments include benzodiazephines and non-SSRI antidepressants. Compliance with medications is 76-100%.  Breathing Problem She complains of difficulty breathing, shortness of breath and wheezing. There is no cough or hemoptysis. This is a chronic problem. The current episode started more than 1 year ago. The problem occurs intermittently. The problem has been unchanged. Associated symptoms include dyspnea on exertion, headaches, myalgias, nasal congestion and sneezing. Pertinent negatives include no chest pain, fever, heartburn, sore throat or weight loss. Her symptoms are aggravated by exposure to smoke, exposure to fumes, change in weather, pollen and strenuous activity. Her symptoms are alleviated by leukotriene antagonist. She reports moderate  improvement on treatment. Her past medical history is significant for asthma.   Chronic pain  Patient has a long-standing history of chronic pain fibromyalgia multiple falls and recent surgery recent fall with fracture  of the acromial head and muscle detachment as well as lumbar disc disease with multiple procedures before. In addition she has chronic migraines.   Past Medical History  Diagnosis Date  . Hypertension   . Asthma   . Hyperlipidemia   . Thyroid disease   . Bronchitis   . Lumbar neuritis   . Shoulder fracture, right   . Anxiety   . Arthritis   . Allergy   . Cataract   . Depression   . GERD (gastroesophageal reflux disease)   . Cancer of both ovaries     History  Substance Use Topics  . Smoking status: Never Smoker   . Smokeless tobacco: Never Used  . Alcohol Use: No     Current outpatient prescriptions:  .  albuterol (PROAIR HFA) 108 (90 BASE) MCG/ACT inhaler, , Disp: , Rfl:  .  atorvastatin (LIPITOR) 40 MG tablet, Take by mouth., Disp: , Rfl:  .  cyclobenzaprine (FLEXERIL) 10 MG tablet, Take by mouth., Disp: , Rfl:  .  fenofibrate 160 MG tablet, Take by mouth., Disp: , Rfl:  .  fluticasone (FLONASE) 50 MCG/ACT nasal spray, , Disp: , Rfl:  .  gabapentin (NEURONTIN) 300 MG capsule, Take by mouth., Disp: , Rfl:  .  HYDROcodone-acetaminophen (NORCO/VICODIN) 5-325 MG per tablet, , Disp: , Rfl:  .  lansoprazole (PREVACID) 30 MG capsule, Take by mouth., Disp: , Rfl:  .  levothyroxine (SYNTHROID) 88 MCG tablet, Take by mouth., Disp: , Rfl:  .  methocarbamol (ROBAXIN) 500 MG tablet, Take by mouth., Disp: , Rfl:  .  montelukast (SINGULAIR) 10 MG tablet, Take by mouth., Disp: , Rfl:  .  tiZANidine (ZANAFLEX) 4 MG tablet, Take by mouth., Disp: , Rfl:  .  venlafaxine XR (EFFEXOR-XR) 37.5 MG 24 hr capsule, Take by mouth., Disp: , Rfl:  .  aspirin EC 81 MG tablet, Take by mouth., Disp: , Rfl:  .  atorvastatin (LIPITOR) 40 MG tablet, TAKE 1 TABLET BY MOUTH AT BEDTIME,  Disp: 90 tablet, Rfl: 1 .  clonazePAM (KLONOPIN) 0.5 MG tablet, Take 1 tablet (0.5 mg total) by mouth 2 (two) times daily., Disp: 60 tablet, Rfl: 1 .  cycloSPORINE (RESTASIS) 0.05 % ophthalmic emulsion, Apply to eye., Disp: , Rfl:  .  diclofenac sodium (VOLTAREN) 1 % GEL, Apply topically., Disp: , Rfl:  .  levalbuterol (XOPENEX) 1.25 MG/0.5ML nebulizer solution, Inhale into the lungs., Disp: , Rfl:  .  magnesium oxide (MAG-OX) 400 MG tablet, Take by mouth., Disp: , Rfl:  .  mometasone-formoterol (DULERA) 200-5 MCG/ACT AERO, Inhale into the lungs., Disp: , Rfl:  .  ondansetron (ZOFRAN ODT) 4 MG disintegrating tablet, Take 1 tablet (4 mg total) by mouth every 8 (eight) hours as needed for nausea or vomiting., Disp: 20 tablet, Rfl: 0 .  oxyCODONE-acetaminophen (ROXICET) 5-325 MG per tablet, Take 1 tablet by mouth every 6 (six) hours as needed., Disp: 20 tablet, Rfl: 0 .  Polyethyl Glycol-Propyl Glycol 0.4-0.3 % SOLN, Apply to eye., Disp: , Rfl:  .  promethazine (PHENERGAN) 25 MG tablet, Take by mouth., Disp: , Rfl:  .  valsartan-hydrochlorothiazide (DIOVAN-HCT) 160-12.5 MG per tablet, Take 1 tablet by mouth daily., Disp: 90 tablet, Rfl: 0 .  Vitamin D, Ergocalciferol, (DRISDOL) 50000 UNITS CAPS capsule, Take by mouth., Disp: , Rfl:   Allergies  Allergen Reactions  . Morphine Nausea Only and Nausea And Vomiting  . Diazepam Nausea And Vomiting  . Salicylates     Other reaction(s): Headache  .  Rash Away  [Petrolatum-Zinc Oxide] Rash and Swelling    Review of Systems  Constitutional: Positive for irritability. Negative for fever, chills and weight loss.  HENT: Positive for sneezing. Negative for congestion, hearing loss, sore throat and tinnitus.   Eyes: Negative for blurred vision, double vision and redness.  Respiratory: Positive for shortness of breath and wheezing. Negative for cough and hemoptysis.   Cardiovascular: Positive for dyspnea on exertion. Negative for chest pain, palpitations,  orthopnea, claudication and leg swelling.  Gastrointestinal: Positive for nausea and vomiting. Negative for heartburn, diarrhea, constipation and blood in stool.  Genitourinary: Negative for dysuria, urgency, frequency and hematuria.  Musculoskeletal: Positive for myalgias, back pain, joint pain, falls and neck pain.  Skin: Negative for itching.  Neurological: Positive for headaches. Negative for dizziness, tingling, tremors, focal weakness, seizures, loss of consciousness and weakness.  Endo/Heme/Allergies: Does not bruise/bleed easily.  Psychiatric/Behavioral: Positive for depression. Negative for suicidal ideas and substance abuse. The patient is nervous/anxious. The patient does not have insomnia.      Objective  Filed Vitals:   09/07/14 1503  BP: 112/70  Pulse: 90  Temp: 97.6 F (36.4 C)  TempSrc: Oral  Resp: 18  Height: 5\' 4"  (1.626 m)  Weight: 216 lb 3.2 oz (98.068 kg)  SpO2: 95%     Physical Exam  Constitutional: She is oriented to person, place, and time and well-developed, well-nourished, and in no distress.  Obese  HENT:  Head: Normocephalic.  Eyes: EOM are normal. Pupils are equal, round, and reactive to light.  Neck: Normal range of motion. No thyromegaly present.  Cardiovascular: Normal rate, regular rhythm and normal heart sounds.   No murmur heard. Pulmonary/Chest: Effort normal and breath sounds normal.  Abdominal: Soft. Bowel sounds are normal.  Musculoskeletal: She exhibits tenderness. She exhibits no edema.  Tender along multiple trigger points along the spine. She is also in a sling on the right arm where she has suffered the loss of fracture of the distal acromion of the shoulder.  Neurological: She is alert and oriented to person, place, and time. No cranial nerve deficit. Gait normal.  Skin: Skin is warm and dry. No rash noted.  Psychiatric: Memory and affect normal.  Depressed mood       Assessment & Plan  1. Chronic pain Worsened recent  acromion fracture  - oxyCODONE-acetaminophen (ROXICET) 5-325 MG per tablet; Take 1 tablet by mouth every 6 (six) hours as needed.  Dispense: 360 tablet; Refill: 0  2. Essential hypertension Well-controlled  3. Anxiety Stable  4. Asthma, moderate persistent, uncomplicated Stable - montelukast (SINGULAIR) 10 MG tablet; Take 1 tablet (10 mg total) by mouth at bedtime.  Dispense: 80 tablet; Refill: 3  5. Gastroesophageal reflux disease with esophagitis Stable  6. Hyperglycemia Labs today - POCT HgB A1C - POCT Glucose (CBG)  7. Angina pectoris Resolved  8. Acute anxiety Stable stable - clonazePAM (KLONOPIN) 0.5 MG tablet; Take 1 tablet (0.5 mg total) by mouth 2 (two) times daily.  Dispense: 180 tablet; Refill: 0

## 2014-09-07 NOTE — Patient Instructions (Signed)

## 2014-09-08 ENCOUNTER — Encounter: Payer: Self-pay | Admitting: Family Medicine

## 2014-09-08 DIAGNOSIS — J4540 Moderate persistent asthma, uncomplicated: Secondary | ICD-10-CM | POA: Insufficient documentation

## 2014-09-09 DIAGNOSIS — M25511 Pain in right shoulder: Secondary | ICD-10-CM | POA: Diagnosis not present

## 2014-09-09 DIAGNOSIS — S42291A Other displaced fracture of upper end of right humerus, initial encounter for closed fracture: Secondary | ICD-10-CM | POA: Diagnosis not present

## 2014-10-05 DIAGNOSIS — S42291A Other displaced fracture of upper end of right humerus, initial encounter for closed fracture: Secondary | ICD-10-CM | POA: Diagnosis not present

## 2014-10-12 ENCOUNTER — Encounter: Payer: Self-pay | Admitting: Family Medicine

## 2014-10-12 ENCOUNTER — Ambulatory Visit (INDEPENDENT_AMBULATORY_CARE_PROVIDER_SITE_OTHER): Payer: Medicare Other | Admitting: Family Medicine

## 2014-10-12 VITALS — BP 124/66 | HR 91 | Temp 98.4°F | Resp 16 | Ht 64.0 in | Wt 217.1 lb

## 2014-10-12 DIAGNOSIS — R109 Unspecified abdominal pain: Secondary | ICD-10-CM | POA: Diagnosis not present

## 2014-10-12 DIAGNOSIS — R197 Diarrhea, unspecified: Secondary | ICD-10-CM

## 2014-10-12 DIAGNOSIS — I209 Angina pectoris, unspecified: Secondary | ICD-10-CM | POA: Diagnosis not present

## 2014-10-12 MED ORDER — ONDANSETRON HCL 4 MG PO TABS: 4.0000 mg | ORAL_TABLET | Freq: Three times a day (TID) | ORAL | Status: DC | PRN

## 2014-10-12 NOTE — Progress Notes (Signed)
Name: Jacqueline Lucas   MRN: [id number]    DOB: [birth year]   Date:10/12/2014       Progress Note  Subjective  Chief Complaint  Chief Complaint  Patient presents with  . Abdominal Pain    pt thinkd it may be IBS or a parisite and she has been positive for c-diff in past  . Diarrhea    Abdominal Pain This is a new problem. The current episode started in the past 7 days. The onset quality is gradual. The problem occurs constantly. The problem has been gradually worsening. The pain is located in the generalized abdominal region and epigastric region. The pain is moderate. The quality of the pain is aching and sharp. The abdominal pain does not radiate. Associated symptoms include anorexia, diarrhea, frequency, myalgias, nausea, vomiting and weight loss. Pertinent negatives include no constipation, dysuria, fever, headaches, hematochezia, hematuria or melena. The pain is aggravated by eating. The pain is relieved by nothing. She has tried proton pump inhibitors for the symptoms. The treatment provided no relief. Her past medical history is significant for GERD.  Diarrhea  This is a new problem. The current episode started in the past 7 days. The problem occurs 2 to 4 times per day. The problem has been gradually worsening. The stool consistency is described as watery. The patient states that diarrhea awakens her from sleep. Associated symptoms include abdominal pain, bloating, chills, myalgias, vomiting and weight loss. Pertinent negatives include no coughing, fever or headaches. The symptoms are aggravated by stress. The treatment provided no relief.      Past Medical History  Diagnosis Date  . Hypertension   . Asthma   . Hyperlipidemia   . Thyroid disease   . Bronchitis   . Lumbar neuritis   . Shoulder fracture, right   . Anxiety   . Arthritis   . Allergy   . Cataract   . Depression   . GERD (gastroesophageal reflux disease)   . Cancer of both ovaries     Social History   Substance Use Topics  . Smoking status: Never Smoker   . Smokeless tobacco: Never Used  . Alcohol Use: No     Current outpatient prescriptions:  .  albuterol (PROAIR HFA) 108 (90 BASE) MCG/ACT inhaler, , Disp: , Rfl:  .  aspirin EC 81 MG tablet, Take by mouth., Disp: , Rfl:  .  atorvastatin (LIPITOR) 40 MG tablet, TAKE 1 TABLET BY MOUTH AT BEDTIME, Disp: 90 tablet, Rfl: 1 .  atorvastatin (LIPITOR) 40 MG tablet, Take by mouth., Disp: , Rfl:  .  clonazePAM (KLONOPIN) 0.5 MG tablet, Take 1 tablet (0.5 mg total) by mouth 2 (two) times daily., Disp: 180 tablet, Rfl: 0 .  cyclobenzaprine (FLEXERIL) 10 MG tablet, Take by mouth., Disp: , Rfl:  .  cycloSPORINE (RESTASIS) 0.05 % ophthalmic emulsion, Apply to eye., Disp: , Rfl:  .  diclofenac sodium (VOLTAREN) 1 % GEL, Apply topically., Disp: , Rfl:  .  fenofibrate 160 MG tablet, Take by mouth., Disp: , Rfl:  .  fluticasone (FLONASE) 50 MCG/ACT nasal spray, , Disp: , Rfl:  .  gabapentin (NEURONTIN) 300 MG capsule, Take by mouth., Disp: , Rfl:  .  HYDROcodone-acetaminophen (NORCO/VICODIN) 5-325 MG per tablet, , Disp: , Rfl:  .  levalbuterol (XOPENEX) 1.25 MG/0.5ML nebulizer solution, Inhale into the lungs., Disp: , Rfl:  .  levothyroxine (SYNTHROID) 88 MCG tablet, Take by mouth., Disp: , Rfl:  .  magnesium oxide (MAG-OX) 400 MG tablet, Take  by mouth., Disp: , Rfl:  .  methocarbamol (ROBAXIN) 500 MG tablet, Take by mouth., Disp: , Rfl:  .  mometasone-formoterol (DULERA) 200-5 MCG/ACT AERO, Inhale into the lungs., Disp: , Rfl:  .  montelukast (SINGULAIR) 10 MG tablet, Take 1 tablet (10 mg total) by mouth at bedtime., Disp: 80 tablet, Rfl: 3 .  ondansetron (ZOFRAN ODT) 4 MG disintegrating tablet, Take 1 tablet (4 mg total) by mouth every 8 (eight) hours as needed for nausea or vomiting., Disp: 20 tablet, Rfl: 0 .  oxyCODONE-acetaminophen (ROXICET) 5-325 MG per tablet, Take 1 tablet by mouth every 6 (six) hours as needed., Disp: 360 tablet, Rfl: 0 .   Polyethyl Glycol-Propyl Glycol 0.4-0.3 % SOLN, Apply to eye., Disp: , Rfl:  .  promethazine (PHENERGAN) 25 MG tablet, Take by mouth., Disp: , Rfl:  .  tiZANidine (ZANAFLEX) 4 MG tablet, Take by mouth., Disp: , Rfl:  .  valsartan-hydrochlorothiazide (DIOVAN-HCT) 160-12.5 MG per tablet, Take 1 tablet by mouth daily., Disp: 90 tablet, Rfl: 0 .  venlafaxine XR (EFFEXOR-XR) 37.5 MG 24 hr capsule, Take by mouth., Disp: , Rfl:  .  Vitamin D, Ergocalciferol, (DRISDOL) 50000 UNITS CAPS capsule, Take by mouth., Disp: , Rfl:  .  lansoprazole (PREVACID) 30 MG capsule, Take by mouth., Disp: , Rfl:   Allergies  Allergen Reactions  . Morphine Nausea Only and Nausea And Vomiting  . Diazepam Nausea And Vomiting  . Salicylates     Other reaction(s): Headache  . Rash Away  [Petrolatum-Zinc Oxide] Rash and Swelling    Review of Systems  Constitutional: Positive for chills and weight loss. Negative for fever.  HENT: Negative for congestion, hearing loss, sore throat and tinnitus.   Eyes: Negative for blurred vision, double vision and redness.  Respiratory: Negative for cough, hemoptysis and shortness of breath.   Cardiovascular: Negative for chest pain, palpitations, orthopnea, claudication and leg swelling.  Gastrointestinal: Positive for nausea, vomiting, abdominal pain, diarrhea, bloating and anorexia. Negative for heartburn, constipation, blood in stool, melena and hematochezia.  Genitourinary: Positive for frequency. Negative for dysuria, urgency and hematuria.  Musculoskeletal: Positive for myalgias. Negative for back pain, joint pain, falls and neck pain.  Skin: Negative for itching.  Neurological: Negative for dizziness, tingling, tremors, focal weakness, seizures, loss of consciousness, weakness and headaches.  Endo/Heme/Allergies: Does not bruise/bleed easily.  Psychiatric/Behavioral: Negative for depression and substance abuse. The patient is not nervous/anxious and does not have insomnia.       Objective  Filed Vitals:   10/12/14 1536  BP: 124/66  Pulse: 91  Temp: 98.4 F (36.9 C)  Resp: 16  Height: 5\' 4"  (1.626 m)  Weight: 217 lb 1 oz (98.459 kg)  SpO2: 93%     Physical Exam  Constitutional: She is oriented to person, place, and time.  HENT:  Head: Normocephalic.  Eyes: EOM are normal. Pupils are equal, round, and reactive to light.  Neck: Normal range of motion. No thyromegaly present.  Cardiovascular: Normal rate, regular rhythm and normal heart sounds.   No murmur heard. Pulmonary/Chest: Effort normal and breath sounds normal.  Abdominal: She exhibits no mass. There is tenderness (generalized tenderness without rebound or masses).  Musculoskeletal: Normal range of motion. She exhibits tenderness. She exhibits no edema.  Neurological: She is alert and oriented to person, place, and time. No cranial nerve deficit. Gait normal.  Skin: Skin is warm and dry. No rash noted.  Psychiatric: Memory and affect normal.      Assessment & Plan  1. Abdominal pain in female Rule out cholelithiasis - US Abdomen Limited RUQ; Future - Comprehensive metabolic panel - CBC with Differential/Platelet - Stool C-Diff Toxin Assay - US Abdomen Complete; Future  2. Diarrhea Rule out pseudomembranous colitis - ondansetron (ZOFRAN) 4 MG tablet; Take 1 tablet (4 mg total) by mouth every 8 (eight) hours as needed for nausea or vomiting.  Dispense: 20 tablet; Refill: 0 - Lipase - Stool C-Diff Toxin Assay - Comprehensive metabolic panel - CBC with Differential/Platelet - Stool C-Diff Toxin Assay - US Abdomen Complete; Future

## 2014-10-13 DIAGNOSIS — R197 Diarrhea, unspecified: Secondary | ICD-10-CM | POA: Diagnosis not present

## 2014-10-14 ENCOUNTER — Ambulatory Visit: Payer: Medicare Other

## 2014-10-14 DIAGNOSIS — R109 Unspecified abdominal pain: Secondary | ICD-10-CM | POA: Insufficient documentation

## 2014-10-15 ENCOUNTER — Ambulatory Visit
Admission: RE | Admit: 2014-10-15 | Discharge: 2014-10-15 | Disposition: A | Discharge status: Home / Self Care | Payer: Medicare Other | Source: Ambulatory Visit | Attending: Family Medicine | Admitting: Family Medicine

## 2014-10-15 ENCOUNTER — Telehealth: Payer: Self-pay | Admitting: Family Medicine

## 2014-10-15 DIAGNOSIS — R109 Unspecified abdominal pain: Secondary | ICD-10-CM

## 2014-10-15 DIAGNOSIS — K760 Fatty (change of) liver, not elsewhere classified: Secondary | ICD-10-CM | POA: Insufficient documentation

## 2014-10-15 LAB — CLOSTRIDIUM DIFFICILE EIA: C difficile Toxins A+B, EIA: NEGATIVE

## 2014-10-15 NOTE — Telephone Encounter (Signed)
Faxed signed order to Jacqueline Lucas in scheduling

## 2014-10-15 NOTE — Telephone Encounter (Signed)
Bethena Roys from Scheduling states that patient has appointment this morning at 11a for ultra sound limited right upper quad. Please fax order to [phone] Attn: Bethena Roys

## 2014-10-20 DIAGNOSIS — R197 Diarrhea, unspecified: Secondary | ICD-10-CM | POA: Diagnosis not present

## 2014-10-21 LAB — LIPASE: Lipase: 16 U/L (ref 0–59)

## 2014-10-26 ENCOUNTER — Ambulatory Visit: Payer: Medicare Other | Admitting: Family Medicine

## 2014-10-27 ENCOUNTER — Ambulatory Visit: Payer: Medicare Other | Admitting: Family Medicine

## 2014-10-29 ENCOUNTER — Encounter: Payer: Self-pay | Admitting: Family Medicine

## 2014-10-29 ENCOUNTER — Ambulatory Visit (INDEPENDENT_AMBULATORY_CARE_PROVIDER_SITE_OTHER): Payer: Medicare Other | Admitting: Family Medicine

## 2014-10-29 VITALS — BP 128/58 | HR 92 | Temp 98.4°F | Resp 16 | Ht 64.0 in | Wt 222.4 lb

## 2014-10-29 DIAGNOSIS — I209 Angina pectoris, unspecified: Secondary | ICD-10-CM | POA: Diagnosis not present

## 2014-10-29 DIAGNOSIS — M797 Fibromyalgia: Secondary | ICD-10-CM | POA: Diagnosis not present

## 2014-10-29 DIAGNOSIS — K589 Irritable bowel syndrome without diarrhea: Secondary | ICD-10-CM

## 2014-10-29 DIAGNOSIS — G8929 Other chronic pain: Secondary | ICD-10-CM | POA: Diagnosis not present

## 2014-10-29 DIAGNOSIS — R1013 Epigastric pain: Secondary | ICD-10-CM

## 2014-10-29 MED ORDER — OXYCODONE-ACETAMINOPHEN 7.5-325 MG PO TABS: 1.0000 | ORAL_TABLET | Freq: Three times a day (TID) | ORAL | Status: DC | PRN

## 2014-10-29 MED ORDER — FENOFIBRATE 145 MG PO TABS: 145.0000 mg | ORAL_TABLET | Freq: Every day | ORAL | Status: DC

## 2014-10-29 MED ORDER — LANSOPRAZOLE 30 MG PO CPDR: 30.0000 mg | DELAYED_RELEASE_CAPSULE | Freq: Every day | ORAL | Status: DC

## 2014-10-29 NOTE — Progress Notes (Signed)
Name: Jacqueline Lucas   MRN: [id number]    DOB: 1950/04/01   Date:10/29/2014       Progress Note  Subjective  Chief Complaint  Chief Complaint  Patient presents with  . Abdominal Pain    follow up     HPI  Follow-up of abdominal pain with nausea and vomiting, and diarrhea  Since last visit patient has had an ultrasound of the gallbladder which showed he fatty liver disease but no evidence of cholelithiasis. Lab work was unremarkable. Currently symptomatology consist mainly of intermittent abdominal cramping and some diarrhea at times. There is no fever or chills. She has known irritable bowel syndrome and notes that certain foods exacerbated though she continues from time to time.  Chronic pain  Since the many orthopedic procedures cleaving knee and back surgery patient's pain is worse. The 05/325 Percocet is not controlling her pain as well as in the past with many episodes  of breakthrough pain.    Fatty infiltrative liver disease  Patient's ultrasound gallbladder showed no gallbladder disease but did show fatty infiltrative disease.  Fibromyalgia  Patient's fibromyalgia pain is flared after the above-mentioned surgical and orthopedic procedures. It is no longer being controlled with current pain medications.  Past Medical History  Diagnosis Date  . Hypertension   . Asthma   . Hyperlipidemia   . Thyroid disease   . Bronchitis   . Lumbar neuritis   . Shoulder fracture, right   . Anxiety   . Arthritis   . Allergy   . Cataract   . Depression   . GERD (gastroesophageal reflux disease)   . Cancer of both ovaries     Social History  Substance Use Topics  . Smoking status: Never Smoker   . Smokeless tobacco: Never Used  . Alcohol Use: No     Current outpatient prescriptions:  .  albuterol (PROAIR HFA) 108 (90 BASE) MCG/ACT inhaler, , Disp: , Rfl:  .  aspirin EC 81 MG tablet, Take by mouth., Disp: , Rfl:  .  atorvastatin (LIPITOR) 40 MG tablet, TAKE 1 TABLET  BY MOUTH AT BEDTIME, Disp: 90 tablet, Rfl: 1 .  atorvastatin (LIPITOR) 40 MG tablet, Take by mouth., Disp: , Rfl:  .  clonazePAM (KLONOPIN) 0.5 MG tablet, Take 1 tablet (0.5 mg total) by mouth 2 (two) times daily., Disp: 180 tablet, Rfl: 0 .  cyclobenzaprine (FLEXERIL) 10 MG tablet, Take by mouth., Disp: , Rfl:  .  cycloSPORINE (RESTASIS) 0.05 % ophthalmic emulsion, Apply to eye., Disp: , Rfl:  .  diclofenac sodium (VOLTAREN) 1 % GEL, Apply topically., Disp: , Rfl:  .  fenofibrate 160 MG tablet, Take by mouth., Disp: , Rfl:  .  fluticasone (FLONASE) 50 MCG/ACT nasal spray, , Disp: , Rfl:  .  gabapentin (NEURONTIN) 300 MG capsule, Take by mouth., Disp: , Rfl:  .  HYDROcodone-acetaminophen (NORCO/VICODIN) 5-325 MG per tablet, , Disp: , Rfl:  .  lansoprazole (PREVACID) 30 MG capsule, Take by mouth., Disp: , Rfl:  .  levalbuterol (XOPENEX) 1.25 MG/0.5ML nebulizer solution, Inhale into the lungs., Disp: , Rfl:  .  levothyroxine (SYNTHROID) 88 MCG tablet, Take by mouth., Disp: , Rfl:  .  magnesium oxide (MAG-OX) 400 MG tablet, Take by mouth., Disp: , Rfl:  .  methocarbamol (ROBAXIN) 500 MG tablet, Take by mouth., Disp: , Rfl:  .  mometasone-formoterol (DULERA) 200-5 MCG/ACT AERO, Inhale into the lungs., Disp: , Rfl:  .  montelukast (SINGULAIR) 10 MG tablet, Take 1 tablet (  10 mg total) by mouth at bedtime., Disp: 80 tablet, Rfl: 3 .  ondansetron (ZOFRAN ODT) 4 MG disintegrating tablet, Take 1 tablet (4 mg total) by mouth every 8 (eight) hours as needed for nausea or vomiting., Disp: 20 tablet, Rfl: 0 .  ondansetron (ZOFRAN) 4 MG tablet, Take 1 tablet (4 mg total) by mouth every 8 (eight) hours as needed for nausea or vomiting., Disp: 20 tablet, Rfl: 0 .  oxyCODONE-acetaminophen (ROXICET) 5-325 MG per tablet, Take 1 tablet by mouth every 6 (six) hours as needed., Disp: 360 tablet, Rfl: 0 .  Polyethyl Glycol-Propyl Glycol 0.4-0.3 % SOLN, Apply to eye., Disp: , Rfl:  .  promethazine (PHENERGAN) 25 MG  tablet, Take by mouth., Disp: , Rfl:  .  tiZANidine (ZANAFLEX) 4 MG tablet, Take by mouth., Disp: , Rfl:  .  valsartan-hydrochlorothiazide (DIOVAN-HCT) 160-12.5 MG per tablet, Take 1 tablet by mouth daily., Disp: 90 tablet, Rfl: 0 .  venlafaxine XR (EFFEXOR-XR) 37.5 MG 24 hr capsule, Take by mouth., Disp: , Rfl:  .  Vitamin D, Ergocalciferol, (DRISDOL) 50000 UNITS CAPS capsule, Take by mouth., Disp: , Rfl:   Allergies  Allergen Reactions  . Morphine Nausea Only and Nausea And Vomiting  . Diazepam Nausea And Vomiting  . Salicylates     Other reaction(s): Headache  . Rash Away  [Petrolatum-Zinc Oxide] Rash and Swelling    Review of Systems  Constitutional: Negative for fever, chills and weight loss.  HENT: Negative for congestion, hearing loss, sore throat and tinnitus.   Eyes: Negative for blurred vision, double vision and redness.  Respiratory: Negative for cough, hemoptysis and shortness of breath.   Cardiovascular: Negative for chest pain, palpitations, orthopnea, claudication and leg swelling.  Gastrointestinal: Positive for nausea, abdominal pain and diarrhea. Negative for heartburn, vomiting, constipation and blood in stool.  Genitourinary: Negative for dysuria, urgency, frequency and hematuria.  Musculoskeletal: Positive for myalgias, back pain and joint pain. Negative for falls and neck pain.  Skin: Negative for itching.  Neurological: Negative for dizziness, tingling, tremors, focal weakness, seizures, loss of consciousness, weakness and headaches.  Endo/Heme/Allergies: Does not bruise/bleed easily.  Psychiatric/Behavioral: Positive for depression. Negative for substance abuse. The patient is nervous/anxious. The patient does not have insomnia.      Objective  Filed Vitals:   10/29/14 1515  BP: 128/58  Pulse: 92  Temp: 98.4 F (36.9 C)  Resp: 16  Height: 5\' 4"  (1.626 m)  Weight: 222 lb 7 oz (100.897 kg)  SpO2: 95%     Physical Exam    Assessment & Plan  1.  Chronic pain Worsening  - oxyCODONE-acetaminophen (PERCOCET) 7.5-325 MG per tablet; Take 1 tablet by mouth every 8 (eight) hours as needed for severe pain.  Dispense: 90 tablet; Refill: 0  2. Epigastric pain Improved  3. IBS (irritable bowel syndrome) Recurrent problem  4. Fibromyalgia syndrome Worsened

## 2014-10-30 DIAGNOSIS — M7989 Other specified soft tissue disorders: Secondary | ICD-10-CM | POA: Diagnosis not present

## 2014-10-30 DIAGNOSIS — I8310 Varicose veins of unspecified lower extremity with inflammation: Secondary | ICD-10-CM | POA: Diagnosis not present

## 2014-10-30 DIAGNOSIS — M79609 Pain in unspecified limb: Secondary | ICD-10-CM | POA: Diagnosis not present

## 2014-10-30 DIAGNOSIS — I8311 Varicose veins of right lower extremity with inflammation: Secondary | ICD-10-CM | POA: Diagnosis not present

## 2014-11-04 DIAGNOSIS — S42291D Other displaced fracture of upper end of right humerus, subsequent encounter for fracture with routine healing: Secondary | ICD-10-CM | POA: Diagnosis not present

## 2014-11-04 DIAGNOSIS — M25561 Pain in right knee: Secondary | ICD-10-CM | POA: Diagnosis not present

## 2014-11-04 DIAGNOSIS — E559 Vitamin D deficiency, unspecified: Secondary | ICD-10-CM | POA: Diagnosis not present

## 2014-11-22 ENCOUNTER — Encounter: Payer: Self-pay | Admitting: *Deleted

## 2014-11-22 ENCOUNTER — Emergency Department
Admission: EM | Admit: 2014-11-22 | Discharge: 2014-11-22 | Disposition: A | Discharge status: Home / Self Care | Payer: Medicare Other | Attending: Emergency Medicine | Admitting: Emergency Medicine

## 2014-11-22 DIAGNOSIS — R319 Hematuria, unspecified: Secondary | ICD-10-CM

## 2014-11-22 DIAGNOSIS — N390 Urinary tract infection, site not specified: Secondary | ICD-10-CM | POA: Diagnosis not present

## 2014-11-22 DIAGNOSIS — Z79899 Other long term (current) drug therapy: Secondary | ICD-10-CM | POA: Diagnosis not present

## 2014-11-22 DIAGNOSIS — R300 Dysuria: Secondary | ICD-10-CM

## 2014-11-22 DIAGNOSIS — I10 Essential (primary) hypertension: Secondary | ICD-10-CM | POA: Insufficient documentation

## 2014-11-22 DIAGNOSIS — E669 Obesity, unspecified: Secondary | ICD-10-CM | POA: Insufficient documentation

## 2014-11-22 DIAGNOSIS — Z791 Long term (current) use of non-steroidal anti-inflammatories (NSAID): Secondary | ICD-10-CM | POA: Insufficient documentation

## 2014-11-22 DIAGNOSIS — Z7982 Long term (current) use of aspirin: Secondary | ICD-10-CM | POA: Insufficient documentation

## 2014-11-22 DIAGNOSIS — R109 Unspecified abdominal pain: Secondary | ICD-10-CM | POA: Diagnosis present

## 2014-11-22 DIAGNOSIS — Z7951 Long term (current) use of inhaled steroids: Secondary | ICD-10-CM | POA: Diagnosis not present

## 2014-11-22 LAB — BASIC METABOLIC PANEL
Anion gap: 7 (ref 5–15)
BUN: 17 mg/dL (ref 6–20)
CO2: 27 mmol/L (ref 22–32)
Calcium: 9.3 mg/dL (ref 8.9–10.3)
Chloride: 108 mmol/L (ref 101–111)
Creatinine, Ser: 0.94 mg/dL (ref 0.44–1.00)
GFR calc Af Amer: >60 mL/min (ref >60)
GFR calc non Af Amer: >60 mL/min (ref >60)
Glucose, Bld: 107 mg/dL — ABNORMAL HIGH (ref 65–99)
Potassium: 4.3 mmol/L (ref 3.5–5.1)
Sodium: 142 mmol/L (ref 135–145)

## 2014-11-22 LAB — CBC WITH DIFFERENTIAL/PLATELET
Basophils Absolute: 0.1 10*3/uL (ref 0–0.1)
Basophils Relative: 1 %
Eosinophils Absolute: 0.3 10*3/uL (ref 0–0.7)
Eosinophils Relative: 4 %
HCT: 37.3 % (ref 35.0–47.0)
Hemoglobin: 12.3 g/dL (ref 12.0–16.0)
Lymphocytes Relative: 20 %
Lymphs Abs: 1.3 10*3/uL (ref 1.0–3.6)
MCH: 28 pg (ref 26.0–34.0)
MCHC: 32.9 g/dL (ref 32.0–36.0)
MCV: 85.1 fL (ref 80.0–100.0)
Monocytes Absolute: 0.6 10*3/uL (ref 0.2–0.9)
Monocytes Relative: 10 %
Neutro Abs: 4.3 10*3/uL (ref 1.4–6.5)
Neutrophils Relative %: 65 %
Platelets: 226 10*3/uL (ref 150–440)
RBC: 4.38 MIL/uL (ref 3.80–5.20)
RDW: 14.9 % — ABNORMAL HIGH (ref 11.5–14.5)
WBC: 6.6 10*3/uL (ref 3.6–11.0)

## 2014-11-22 LAB — URINALYSIS COMPLETE WITH MICROSCOPIC (ARMC ONLY)
Bacteria, UA: NONE SEEN
Bilirubin Urine: NEGATIVE
Glucose, UA: NEGATIVE mg/dL
Ketones, ur: NEGATIVE mg/dL
Nitrite: NEGATIVE
Protein, ur: NEGATIVE mg/dL
Specific Gravity, Urine: 1.021 (ref 1.005–1.030)
pH: 6 (ref 5.0–8.0)

## 2014-11-22 MED ORDER — CEPHALEXIN 500 MG PO CAPS
500.0000 mg | ORAL_CAPSULE | Freq: Once | ORAL | Status: AC
  Administered 2014-11-22: 500 mg via ORAL
  Filled 2014-11-22: qty 1

## 2014-11-22 MED ORDER — CEPHALEXIN 500 MG PO TABS: 500.0000 mg | ORAL_TABLET | Freq: Two times a day (BID) | ORAL | Status: DC

## 2014-11-22 NOTE — ED Notes (Signed)
Pt reports believes she has bladder infection. Pain to flank area. Burning with urination and frequency.

## 2014-11-22 NOTE — Discharge Instructions (Signed)
You're being treated for presumed urinary tract infection based on your symptoms and urine showing white blood cells and red blood cells. A culture was sent and you be called if there is any sign of resistance to the antibiotic chosen.  Return to the emergency department for any new or worsening condition including abdominal pain, dizziness, passing out, fever, or any other symptoms concerning to you.  We discussed follow-up with your primary care physician within one week, and urologist next available.   Urinary Tract Infection A urinary tract infection (UTI) can occur any place along the urinary tract. The tract includes the kidneys, ureters, bladder, and urethra. A type of germ called bacteria often causes a UTI. UTIs are often helped with antibiotic medicine.  HOME CARE   If given, take antibiotics as told by your doctor. Finish them even if you start to feel better.  Drink enough fluids to keep your pee (urine) clear or pale yellow.  Avoid tea, drinks with caffeine, and bubbly (carbonated) drinks.  Pee often. Avoid holding your pee in for a long time.  Pee before and after having sex (intercourse).  Wipe from front to back after you poop (bowel movement) if you are a woman. Use each tissue only once. GET HELP RIGHT AWAY IF:   You have back pain.  You have lower belly (abdominal) pain.  You have chills.  You feel sick to your stomach (nauseous).  You throw up (vomit).  Your burning or discomfort with peeing does not go away.  You have a fever.  Your symptoms are not better in 3 days. MAKE SURE YOU:   Understand these instructions.  Will watch your condition.  Will get help right away if you are not doing well or get worse. Document Released: 08/02/2007 Document Revised: 11/08/2011 Document Reviewed: 09/14/2011 [facility] Patient Information 2015 [place], [place]. This information is not intended to replace advice given to you by your health care provider. Make sure you  discuss any questions you have with your health care provider.

## 2014-11-22 NOTE — ED Provider Notes (Signed)
[facility] Emergency Department Jacqueline Lucas Note   ____________________________________________  Time seen: 6:20 PM I have reviewed the triage vital signs and the triage nursing note.  HISTORY  Chief Complaint Flank Pain   Historian Patient and spouse  HPI Jacqueline Lucas is a 64 y.o. female who is presenting for dysuria, suprapubic tenderness and low back pain since once afternoon. This feels similar to when she is previously been diagnosed with a urinary tract infection. Patient states she's had an ongoing history for what sounds like years of intermittent hematuria for which she has recently been evaluated by a urologist as well as her primary care physician. She states that she had a cystoscopy with urologyand was told there is no cancerous cells, but there is inflammation. Patient is denying flank pain. She's never had a kidney stone before. She doesn't think that she's had a CT an ultrasound of her kidneys in the past. Patient has had chronic low back pain and feels like her low back pain is probably due to her back issues rather than something else.  She denies other easy bruising or bleeding, other than heavy hematuria and heavy vaginal bleeding when she was in her 80s. She denies having been worked up for blood disorders that she knows of.  No fevers. No weight loss. Patient states that she sweats at times but this is been evaluated by primary care physician she was told that she has overactive sore glands of her head and neck.  Patient has had a hysterectomy and her ovaries removed for cysts which turned out to be noncancerous.    Past Medical History  Diagnosis Date  . Hypertension   . Asthma   . Hyperlipidemia   . Thyroid disease   . Bronchitis   . Lumbar neuritis   . Shoulder fracture, right   . Anxiety   . Arthritis   . Allergy   . Cataract   . Depression   . GERD (gastroesophageal reflux disease)   . Cancer of both ovaries      Patient Active Problem List   Diagnosis Date Noted  . IBS (irritable bowel syndrome) 10/29/2014  . Abdominal pain in female 10/14/2014  . Diarrhea 10/14/2014  . Acute anxiety 09/08/2014  . Asthma, moderate persistent 09/08/2014  . Allergic state 09/07/2014  . Colon polyp 09/07/2014  . Hemorrhoid 09/07/2014  . Personal history of infectious and parasitic disease 09/07/2014  . Chronic pain 09/07/2014  . Constrictive tenosynovitis 03/12/2014  . H/O total knee replacement 07/31/2013  . Angina pectoris 11/28/2012  . Anxiety 11/28/2012  . Feeling bilious 11/28/2012  . Fibrositis 11/28/2012  . Acid reflux 11/28/2012  . Cardiac murmur 11/28/2012  . History of migraine headaches 11/28/2012  . BP (high blood pressure) 11/28/2012  . Cannot sleep 11/28/2012  . Adiposity 11/28/2012  . Arthritis, degenerative 11/28/2012  . Awareness of heartbeats 11/28/2012  . Restless leg 11/28/2012  . Supraventricular tachycardia 11/28/2012  . Fibromyalgia 11/28/2012    Past Surgical History  Procedure Laterality Date  . Replacement total knee Left   . Total hip arthroplasty Left 2013  . Back surgery N/A 2014  . Hemorrhoid surgery    . Abdominal hysterectomy      due to cancer of ovaries  . Spine surgery    . Tendonititis      right elbow, right wrist    Current Outpatient Rx  Name  Route  Sig  Dispense  Refill  . albuterol (PROAIR HFA) 108 (90 BASE)  MCG/ACT inhaler               . aspirin EC 81 MG tablet   Oral   Take by mouth.         [name] atorvastatin (LIPITOR) 40 MG tablet      TAKE 1 TABLET BY MOUTH AT BEDTIME   90 tablet   1   . atorvastatin (LIPITOR) 40 MG tablet   Oral   Take by mouth.         . Cephalexin 500 MG tablet   Oral   Take 1 tablet (500 mg total) by mouth 2 (two) times daily.   13 tablet   0   . clonazePAM (KLONOPIN) 0.5 MG tablet   Oral   Take 1 tablet (0.5 mg total) by mouth 2 (two) times daily.   180 tablet   0   . cyclobenzaprine  (FLEXERIL) 10 MG tablet   Oral   Take by mouth.         . cycloSPORINE (RESTASIS) 0.05 % ophthalmic emulsion   Ophthalmic   Apply to eye.         . diclofenac sodium (VOLTAREN) 1 % GEL   Topical   Apply topically.         . fenofibrate (TRICOR) 145 MG tablet   Oral   Take 1 tablet (145 mg total) by mouth daily.   90 tablet   2   . fluticasone (FLONASE) 50 MCG/ACT nasal spray               . gabapentin (NEURONTIN) 300 MG capsule   Oral   Take by mouth.         [name] HYDROcodone-acetaminophen (NORCO/VICODIN) 5-325 MG per tablet               . lansoprazole (PREVACID) 30 MG capsule   Oral   Take 1 capsule (30 mg total) by mouth daily at 12 noon.   180 capsule   2   . levalbuterol (XOPENEX) 1.25 MG/0.5ML nebulizer solution   Inhalation   Inhale into the lungs.         [name] levothyroxine (SYNTHROID) 88 MCG tablet   Oral   Take by mouth.         . magnesium oxide (MAG-OX) 400 MG tablet   Oral   Take by mouth.         . methocarbamol (ROBAXIN) 500 MG tablet   Oral   Take by mouth.         . mometasone-formoterol (DULERA) 200-5 MCG/ACT AERO   Inhalation   Inhale into the lungs.         . montelukast (SINGULAIR) 10 MG tablet   Oral   Take 1 tablet (10 mg total) by mouth at bedtime.   80 tablet   3   . ondansetron (ZOFRAN ODT) 4 MG disintegrating tablet   Oral   Take 1 tablet (4 mg total) by mouth every 8 (eight) hours as needed for nausea or vomiting.   20 tablet   0   . ondansetron (ZOFRAN) 4 MG tablet   Oral   Take 1 tablet (4 mg total) by mouth every 8 (eight) hours as needed for nausea or vomiting.   20 tablet   0   . oxyCODONE-acetaminophen (PERCOCET) 7.5-325 MG per tablet   Oral   Take 1 tablet by mouth every 8 (eight) hours as needed for severe pain.   90 tablet   0  rf 11/01/14   . Polyethyl Glycol-Propyl Glycol 0.4-0.3 % SOLN   Ophthalmic   Apply to eye.         . promethazine (PHENERGAN) 25 MG tablet   Oral    Take by mouth.         [name] tiZANidine (ZANAFLEX) 4 MG tablet   Oral   Take by mouth.         . valsartan-hydrochlorothiazide (DIOVAN-HCT) 160-12.5 MG per tablet   Oral   Take 1 tablet by mouth daily.   90 tablet   0   . venlafaxine XR (EFFEXOR-XR) 37.5 MG 24 hr capsule   Oral   Take by mouth.         . Vitamin D, Ergocalciferol, (DRISDOL) 50000 UNITS CAPS capsule   Oral   Take by mouth.           Allergies Morphine; Diazepam; Salicylates; and Rash away   Family History  Problem Relation Age of Onset  . Diabetes Mother   . Hypertension Mother   . Heart disease Mother   . Diabetes Father   . Cancer Father   . Hypertension Sister   . Depression Sister   . Cancer Brother     Social History Social History  Substance Use Topics  . Smoking status: Never Smoker   . Smokeless tobacco: Never Used  . Alcohol Use: No    Review of Systems  Constitutional: Negative for fever. Eyes: Negative for visual changes. ENT: Negative for sore throat. Cardiovascular: Negative for chest pain. Respiratory: Negative for shortness of breath. Gastrointestinal: Negative for vomiting and diarrhea. Genitourinary: Positive for dysuria. Musculoskeletal: Positive for mild low back pain. Skin: Negative for rash. Neurological: Negative for headache. 10 point Review of Systems otherwise negative ____________________________________________   PHYSICAL EXAM:  VITAL SIGNS: ED Triage Vitals  Enc Vitals Group     BP 11/22/14 1704 131/79 mmHg     Pulse Rate 11/22/14 1704 78     Resp 11/22/14 1704 18     Temp 11/22/14 1704 98.5 F (36.9 C)     Temp Source 11/22/14 1704 Oral     SpO2 11/22/14 1704 97 %     Weight 11/22/14 1704 212 lb (96.163 kg)     Height 11/22/14 1704 5\' 4"  (1.626 m)     Head Cir --      Peak Flow --      Pain Score 11/22/14 1702 8     Pain Loc --      Pain Edu? --      Excl. in [place]? --      Constitutional: Alert and oriented. Well appearing and in no  distress. Eyes: Conjunctivae are normal. PERRL. Normal extraocular movements. ENT   Head: Normocephalic and atraumatic.   Nose: No congestion/rhinnorhea.   Mouth/Throat: Mucous membranes are moist.   Neck: No stridor. Cardiovascular/Chest: Normal rate, regular rhythm.  No murmurs, rubs, or gallops. Respiratory: Normal respiratory effort without tachypnea nor retractions. Breath sounds are clear and equal bilaterally. No wheezes/rales/rhonchi. Gastrointestinal: Soft. No distention, no guarding, no rebound. Very minimal tenderness suprapubically.. Obese.  Genitourinary/rectal:Deferred Musculoskeletal: Nontender with normal range of motion in all extremities. No joint effusions.  No lower extremity tenderness.  No edema. Neurologic:  Normal speech and language. No gross or focal neurologic deficits are appreciated. Skin:  Skin is warm, dry and intact. No rash noted. Psychiatric: Mood and affect are normal. Speech and behavior are normal. Patient exhibits appropriate insight and judgment.  ____________________________________________  EKG I, Lisa Roca, MD, the attending physician have personally viewed and interpreted all ECGs.  No EKG performed ____________________________________________  LABS (pertinent positives/negatives)  Urinalysis 3+ leukocytes, too numerous to count red blood cells and white blood cells, no bacteria is. No nitrites Basic metabolic panel within normal limits. No renal failure. No electrolyte abnormalities. CBC within normal limits. No elevated white blood cell count or left shift. Normal hemoglobin and platelet count.  ____________________________________________  RADIOLOGY All Xrays were viewed by me. Imaging interpreted by Radiologist.  None __________________________________________  PROCEDURES  Procedure(s) performed: None  Critical Care performed: None  ____________________________________________   ED COURSE / ASSESSMENT AND  PLAN  CONSULTATIONS: None  Pertinent labs & imaging results that were available during my care of the patient were reviewed by me and considered in my medical decision making (see chart for details).  The patient is here for dysuria and symptoms that she suspects it are a UTI. Her urinalysis is positive for leukocytes, red blood cells, white blood cells without bacteria or nitrates, however your symptoms I am going to treat her for also urinary tract infection with Keflex. A urine culture was sent.  Her history is actually somewhat more interesting that she's had intermittent hematuria for a long time that sounds like has been at least partially worked up by urology and her primary care physician. I asked her to follow up with primary care physician and urologist.  I do not think that she's having symptoms of a kidney stone, without any flank pain, and she is very comfortable. We discussed that she may need imaging at some point time in the future up to the discretion of her urologist, given that I'm not sure what all she has had in terms of workup of the hematuria.  We also discussed discussing with her primary care physician about whether or not she's had an evaluation for blood disorders given the chronic hematuria.  ----------------------------------------- 7:54 PM on 11/22/2014 -----------------------------------------  Labs are reassuring. No renal failure. No low platelet count or anemia. Patient be discharged home, treated for likely urinary tract infection, and follow up with her primary care physician as well as urologist.  Patient / Family / Caregiver informed of clinical course, medical decision-making process, and agree with plan.   I discussed return precautions, follow-up instructions, and discharged instructions with patient and/or family.  ___________________________________________   FINAL CLINICAL IMPRESSION(S) / ED DIAGNOSES   Final diagnoses:  Hematuria  Dysuria   Urinary tract infection with hematuria, site unspecified       Lisa Roca, MD 11/22/14 1955

## 2014-11-24 ENCOUNTER — Ambulatory Visit: Payer: Medicare Other | Admitting: Family Medicine

## 2014-11-24 LAB — URINE CULTURE: Culture: 40000

## 2014-12-02 ENCOUNTER — Other Ambulatory Visit: Payer: Self-pay

## 2014-12-02 DIAGNOSIS — I872 Venous insufficiency (chronic) (peripheral): Secondary | ICD-10-CM | POA: Diagnosis not present

## 2014-12-02 DIAGNOSIS — I8310 Varicose veins of unspecified lower extremity with inflammation: Secondary | ICD-10-CM | POA: Diagnosis not present

## 2014-12-02 DIAGNOSIS — M79609 Pain in unspecified limb: Secondary | ICD-10-CM | POA: Diagnosis not present

## 2014-12-02 DIAGNOSIS — M7989 Other specified soft tissue disorders: Secondary | ICD-10-CM | POA: Diagnosis not present

## 2014-12-02 DIAGNOSIS — I8311 Varicose veins of right lower extremity with inflammation: Secondary | ICD-10-CM | POA: Diagnosis not present

## 2014-12-02 MED ORDER — VALSARTAN-HYDROCHLOROTHIAZIDE 160-12.5 MG PO TABS: 1.0000 | ORAL_TABLET | Freq: Every day | ORAL | Status: DC

## 2014-12-07 ENCOUNTER — Ambulatory Visit: Payer: Medicare Other | Admitting: Family Medicine

## 2014-12-08 ENCOUNTER — Ambulatory Visit: Payer: Medicare Other | Admitting: Family Medicine

## 2014-12-08 DIAGNOSIS — F411 Generalized anxiety disorder: Secondary | ICD-10-CM | POA: Diagnosis not present

## 2014-12-08 DIAGNOSIS — E071 Dyshormogenetic goiter: Secondary | ICD-10-CM | POA: Diagnosis not present

## 2014-12-08 DIAGNOSIS — M810 Age-related osteoporosis without current pathological fracture: Secondary | ICD-10-CM | POA: Diagnosis not present

## 2014-12-08 DIAGNOSIS — E039 Hypothyroidism, unspecified: Secondary | ICD-10-CM | POA: Diagnosis not present

## 2014-12-08 DIAGNOSIS — E049 Nontoxic goiter, unspecified: Secondary | ICD-10-CM | POA: Diagnosis not present

## 2014-12-08 DIAGNOSIS — E782 Mixed hyperlipidemia: Secondary | ICD-10-CM | POA: Diagnosis not present

## 2014-12-08 DIAGNOSIS — M150 Primary generalized (osteo)arthritis: Secondary | ICD-10-CM | POA: Diagnosis not present

## 2014-12-08 DIAGNOSIS — I10 Essential (primary) hypertension: Secondary | ICD-10-CM | POA: Diagnosis not present

## 2014-12-15 ENCOUNTER — Encounter: Payer: Self-pay | Admitting: Family Medicine

## 2014-12-15 ENCOUNTER — Ambulatory Visit (INDEPENDENT_AMBULATORY_CARE_PROVIDER_SITE_OTHER): Payer: Medicare Other | Admitting: Family Medicine

## 2014-12-15 VITALS — BP 128/62 | HR 94 | Temp 98.0°F | Resp 16 | Ht 64.0 in | Wt 226.9 lb

## 2014-12-15 DIAGNOSIS — J0100 Acute maxillary sinusitis, unspecified: Secondary | ICD-10-CM

## 2014-12-15 DIAGNOSIS — I209 Angina pectoris, unspecified: Secondary | ICD-10-CM | POA: Diagnosis not present

## 2014-12-15 DIAGNOSIS — J4541 Moderate persistent asthma with (acute) exacerbation: Secondary | ICD-10-CM

## 2014-12-15 DIAGNOSIS — R319 Hematuria, unspecified: Secondary | ICD-10-CM

## 2014-12-15 DIAGNOSIS — Z8619 Personal history of other infectious and parasitic diseases: Secondary | ICD-10-CM | POA: Insufficient documentation

## 2014-12-15 DIAGNOSIS — J40 Bronchitis, not specified as acute or chronic: Secondary | ICD-10-CM

## 2014-12-15 LAB — POCT URINALYSIS DIPSTICK
Bilirubin, UA: NEGATIVE
Blood, UA: NEGATIVE
Glucose, UA: NEGATIVE
Ketones, UA: NEGATIVE
Leukocytes, UA: NEGATIVE
Nitrite, UA: NEGATIVE
Spec Grav, UA: 1.02
Urobilinogen, UA: NEGATIVE
pH, UA: 5

## 2014-12-15 MED ORDER — LANSOPRAZOLE 30 MG PO CPDR: 30.0000 mg | DELAYED_RELEASE_CAPSULE | Freq: Two times a day (BID) | ORAL | Status: DC

## 2014-12-15 MED ORDER — PREDNISONE 20 MG PO TABS: 20.0000 mg | ORAL_TABLET | Freq: Every day | ORAL | Status: DC

## 2014-12-15 MED ORDER — ALBUTEROL SULFATE (2.5 MG/3ML) 0.083% IN NEBU: 2.5000 mg | INHALATION_SOLUTION | Freq: Once | RESPIRATORY_TRACT | Status: DC

## 2014-12-15 MED ORDER — BENZONATATE 100 MG PO CAPS: 100.0000 mg | ORAL_CAPSULE | Freq: Two times a day (BID) | ORAL | Status: DC | PRN

## 2014-12-15 MED ORDER — ALBUTEROL SULFATE HFA 108 (90 BASE) MCG/ACT IN AERS: 2.0000 | INHALATION_SPRAY | Freq: Four times a day (QID) | RESPIRATORY_TRACT | Status: DC | PRN

## 2014-12-15 MED ORDER — FLUTICASONE PROPIONATE 50 MCG/ACT NA SUSP: 2.0000 | Freq: Every day | NASAL | Status: DC

## 2014-12-15 MED ORDER — AMOXICILLIN-POT CLAVULANATE 875-125 MG PO TABS: 1.0000 | ORAL_TABLET | Freq: Two times a day (BID) | ORAL | Status: DC

## 2014-12-15 NOTE — Progress Notes (Signed)
Name: Jacqueline Lucas   MRN: [id number]    DOB: 1950/04/26   Date:12/15/2014       Progress Note  Subjective  Chief Complaint  Chief Complaint  Patient presents with  . URI    3 weeks  . Transition into care    from ER for hematuria, need Urology referral    HPI   Sinusitis  Patient presents with greater than 7 day history of nasal congestion and drainage which is purulent in color. There is tenderness over the sinuses. There has been fever to none along with some associated chills on occasion. Usage of over-the-counter medications is not been affected. There is also accompanying cough productive of purulent sputum.  Bronchitis  Patient presents with a greater than 1 week history of cough productive of purulent sputum. The cough is irritating and keep the patient awake at night. There has no fever or chills.  Over-the-counter meds And completely effective.  Hematuria  Patiently recent his urinalysis was seen in emergency department where she was noted to have gross hematuria and some white cells she is here for referral to urologist.,   She had the episode about 3 weeks ago and was treated with antibiotics as well. Culture was negative  Past Medical History  Diagnosis Date  . Hypertension   . Asthma   . Hyperlipidemia   . Thyroid disease   . Bronchitis   . Lumbar neuritis   . Shoulder fracture, right   . Anxiety   . Arthritis   . Allergy   . Cataract   . Depression   . GERD (gastroesophageal reflux disease)   . Cancer of both ovaries [facility])     Social History  Substance Use Topics  . Smoking status: Never Smoker   . Smokeless tobacco: Never Used  . Alcohol Use: No     Current outpatient prescriptions:  .  albuterol (PROAIR HFA) 108 (90 BASE) MCG/ACT inhaler, , Disp: , Rfl:  .  aspirin EC 81 MG tablet, Take by mouth., Disp: , Rfl:  .  atorvastatin (LIPITOR) 40 MG tablet, TAKE 1 TABLET BY MOUTH AT BEDTIME, Disp: 90 tablet, Rfl: 1 .  atorvastatin (LIPITOR)  40 MG tablet, Take by mouth., Disp: , Rfl:  .  Cephalexin 500 MG tablet, Take 1 tablet (500 mg total) by mouth 2 (two) times daily., Disp: 13 tablet, Rfl: 0 .  clonazePAM (KLONOPIN) 0.5 MG tablet, Take 1 tablet (0.5 mg total) by mouth 2 (two) times daily., Disp: 180 tablet, Rfl: 0 .  cyclobenzaprine (FLEXERIL) 10 MG tablet, Take by mouth., Disp: , Rfl:  .  cycloSPORINE (RESTASIS) 0.05 % ophthalmic emulsion, Apply to eye., Disp: , Rfl:  .  diclofenac sodium (VOLTAREN) 1 % GEL, Apply topically., Disp: , Rfl:  .  fenofibrate (TRICOR) 145 MG tablet, Take 1 tablet (145 mg total) by mouth daily., Disp: 90 tablet, Rfl: 2 .  fluticasone (FLONASE) 50 MCG/ACT nasal spray, , Disp: , Rfl:  .  gabapentin (NEURONTIN) 300 MG capsule, Take by mouth., Disp: , Rfl:  .  HYDROcodone-acetaminophen (NORCO/VICODIN) 5-325 MG per tablet, , Disp: , Rfl:  .  lansoprazole (PREVACID) 30 MG capsule, Take 1 capsule (30 mg total) by mouth 2 (two) times daily before a meal., Disp: 180 capsule, Rfl: 3 .  levalbuterol (XOPENEX) 1.25 MG/0.5ML nebulizer solution, Inhale into the lungs., Disp: , Rfl:  .  levothyroxine (SYNTHROID) 88 MCG tablet, Take by mouth., Disp: , Rfl:  .  magnesium oxide (MAG-OX) 400 MG  tablet, Take by mouth., Disp: , Rfl:  .  methocarbamol (ROBAXIN) 500 MG tablet, Take by mouth., Disp: , Rfl:  .  mometasone-formoterol (DULERA) 200-5 MCG/ACT AERO, Inhale into the lungs., Disp: , Rfl:  .  montelukast (SINGULAIR) 10 MG tablet, Take 1 tablet (10 mg total) by mouth at bedtime., Disp: 80 tablet, Rfl: 3 .  ondansetron (ZOFRAN ODT) 4 MG disintegrating tablet, Take 1 tablet (4 mg total) by mouth every 8 (eight) hours as needed for nausea or vomiting., Disp: 20 tablet, Rfl: 0 .  ondansetron (ZOFRAN) 4 MG tablet, Take 1 tablet (4 mg total) by mouth every 8 (eight) hours as needed for nausea or vomiting., Disp: 20 tablet, Rfl: 0 .  oxyCODONE-acetaminophen (PERCOCET) 7.5-325 MG per tablet, Take 1 tablet by mouth every 8  (eight) hours as needed for severe pain., Disp: 90 tablet, Rfl: 0 .  Polyethyl Glycol-Propyl Glycol 0.4-0.3 % SOLN, Apply to eye., Disp: , Rfl:  .  promethazine (PHENERGAN) 25 MG tablet, Take by mouth., Disp: , Rfl:  .  tiZANidine (ZANAFLEX) 4 MG tablet, Take by mouth., Disp: , Rfl:  .  valsartan-hydrochlorothiazide (DIOVAN-HCT) 160-12.5 MG tablet, Take 1 tablet by mouth daily., Disp: 90 tablet, Rfl: 0 .  venlafaxine XR (EFFEXOR-XR) 37.5 MG 24 hr capsule, Take by mouth., Disp: , Rfl:  .  Vitamin D, Ergocalciferol, (DRISDOL) 50000 UNITS CAPS capsule, Take by mouth., Disp: , Rfl:   Allergies  Allergen Reactions  . Morphine Nausea Only and Nausea And Vomiting  . Diazepam Nausea And Vomiting    Other reaction(s): Vomiting  . Salicylates     Other reaction(s): Headache  . Rash Away  [Petrolatum-Zinc Oxide] Rash and Swelling    Review of Systems  Constitutional: Positive for chills and malaise/fatigue. Negative for fever and weight loss.  HENT: Positive for congestion. Negative for hearing loss, sore throat and tinnitus.   Eyes: Negative for blurred vision, double vision and redness.  Respiratory: Positive for cough, sputum production, shortness of breath and wheezing. Negative for hemoptysis.   Cardiovascular: Negative for chest pain, palpitations, orthopnea, claudication and leg swelling.  Gastrointestinal: Negative for heartburn, nausea, vomiting, diarrhea, constipation and blood in stool.  Genitourinary: Negative for dysuria, urgency, frequency and hematuria.  Musculoskeletal: Positive for myalgias, back pain, joint pain and neck pain. Negative for falls.  Skin: Negative for itching.  Neurological: Positive for dizziness. Negative for tingling, tremors, focal weakness, seizures, loss of consciousness, weakness and headaches.  Endo/Heme/Allergies: Does not bruise/bleed easily.  Psychiatric/Behavioral: Positive for depression and memory loss. Negative for substance abuse. The patient is  nervous/anxious and has insomnia.      Objective  Filed Vitals:   12/15/14 1356  BP: 128/62  Pulse: 94  Temp: 98 F (36.7 C)  TempSrc: Oral  Resp: 16  Height: 5\' 4"  (1.626 m)  Weight: 226 lb 14.4 oz (102.921 kg)  SpO2: 95%     Physical Exam  Constitutional: She is oriented to person, place, and time and well-developed, well-nourished, and in no distress.  Obese female who is coughing frequently.  HENT:  Head: Normocephalic.  Mouth/Throat: Oropharynx is clear and moist.  Tenderness over the frontal and maxillary sinuses. Nasal turbinates are erythematous clear to purulent discharge. TMs are clear  Eyes: EOM are normal. Pupils are equal, round, and reactive to light.  Neck: Normal range of motion. No thyromegaly present.  Cardiovascular: Normal rate, regular rhythm and normal heart sounds.   No murmur heard. Pulmonary/Chest:  Diminished breath sounds throughout with  expiratory wheezes and a few scattered rhonchi as well.  Abdominal: Soft. Bowel sounds are normal.  Musculoskeletal: She exhibits tenderness. She exhibits no edema.  Neurological: She is alert and oriented to person, place, and time. No cranial nerve deficit. Gait normal.  Skin: Skin is warm and dry. No rash noted.  Psychiatric: Affect normal.  Anxious and loquacious as baseline      Assessment & Plan  1. Bronchitis With wheezing - amoxicillin-clavulanate (AUGMENTIN) 875-125 MG tablet; Take 1 tablet by mouth 2 (two) times daily.  Dispense: 20 tablet; Refill: 0 - predniSONE (DELTASONE) 20 MG tablet; Take 1 tablet (20 mg total) by mouth daily with breakfast.  Dispense: 10 tablet; Refill: 0 - fluticasone (FLONASE) 50 MCG/ACT nasal spray; Place 2 sprays into both nostrils daily.  Dispense: 16 g; Refill: 6 - benzonatate (TESSALON) 100 MG capsule; Take 1 capsule (100 mg total) by mouth 2 (two) times daily as needed for cough.  Dispense: 20 capsule; Refill: 0 - albuterol (PROAIR HFA) 108 (90 BASE) MCG/ACT  inhaler; Inhale 2 puffs into the lungs every 6 (six) hours as needed for wheezing or shortness of breath.  Dispense: 1 Inhaler; Refill: 1 - albuterol (PROVENTIL) (2.5 MG/3ML) 0.083% nebulizer solution 2.5 mg; Take 3 mLs (2.5 mg total) by nebulization once.  2. Hematuria  - Ambulatory referral to Urology - POCT urinalysis dipstick  3. Acute maxillary sinusitis, recurrence not specified As above  4. asthma acute exacerbation

## 2014-12-16 ENCOUNTER — Telehealth: Payer: Self-pay | Admitting: Family Medicine

## 2014-12-16 NOTE — Telephone Encounter (Signed)
Next Appt: 01-28-15 Requesting refill on Valsartan HCT 160/12.5 once daily.  Please send 90day supply to [facility]

## 2014-12-18 NOTE — Telephone Encounter (Signed)
Script for 90 day supply faxed

## 2014-12-22 ENCOUNTER — Ambulatory Visit (INDEPENDENT_AMBULATORY_CARE_PROVIDER_SITE_OTHER): Payer: Medicare Other | Admitting: Family Medicine

## 2014-12-22 ENCOUNTER — Encounter: Payer: Self-pay | Admitting: Family Medicine

## 2014-12-22 VITALS — BP 138/76 | HR 97 | Temp 98.4°F | Resp 16 | Ht 64.0 in | Wt 219.4 lb

## 2014-12-22 DIAGNOSIS — I209 Angina pectoris, unspecified: Secondary | ICD-10-CM | POA: Diagnosis not present

## 2014-12-22 DIAGNOSIS — N390 Urinary tract infection, site not specified: Secondary | ICD-10-CM | POA: Diagnosis not present

## 2014-12-22 LAB — POCT URINALYSIS DIPSTICK
Bilirubin, UA: NEGATIVE
Blood, UA: NEGATIVE
Glucose, UA: NEGATIVE
Ketones, UA: NEGATIVE
Leukocytes, UA: NEGATIVE
Nitrite, UA: NEGATIVE
Protein, UA: NEGATIVE
Spec Grav, UA: 1.01
Urobilinogen, UA: NEGATIVE
pH, UA: 5

## 2014-12-22 MED ORDER — VENLAFAXINE HCL ER 75 MG PO CP24: 75.0000 mg | ORAL_CAPSULE | Freq: Every day | ORAL | Status: DC

## 2014-12-22 NOTE — Progress Notes (Signed)
Name: Jacqueline Lucas   MRN: [id number]    DOB: 1950-04-30   Date:12/22/2014       Progress Note  Subjective  Chief Complaint  Chief Complaint  Patient presents with  . Urinary Tract Infection  . Hospitalization Follow-up    for urinary issues    HPI  Follow-up hematuria  Patient presents after an evaluation in emergency room and being seen in the office about 2 weeks ago for UTI about 2 weeks ago. She is no longer this note and hematuria and she is symptom free. She is on a scheduled with urologist for sounds to be cystoscopy.     Past Medical History  Diagnosis Date  . Hypertension   . Asthma   . Hyperlipidemia   . Thyroid disease   . Bronchitis   . Lumbar neuritis   . Shoulder fracture, right   . Anxiety   . Arthritis   . Allergy   . Cataract   . Depression   . GERD (gastroesophageal reflux disease)   . Cancer of both ovaries [facility])     Social History  Substance Use Topics  . Smoking status: Never Smoker   . Smokeless tobacco: Never Used  . Alcohol Use: No     Current outpatient prescriptions:  .  albuterol (PROAIR HFA) 108 (90 BASE) MCG/ACT inhaler, Inhale 2 puffs into the lungs every 6 (six) hours as needed for wheezing or shortness of breath., Disp: 1 Inhaler, Rfl: 1 .  amoxicillin-clavulanate (AUGMENTIN) 875-125 MG tablet, Take 1 tablet by mouth 2 (two) times daily., Disp: 20 tablet, Rfl: 0 .  aspirin EC 81 MG tablet, Take by mouth., Disp: , Rfl:  .  atorvastatin (LIPITOR) 40 MG tablet, TAKE 1 TABLET BY MOUTH AT BEDTIME, Disp: 90 tablet, Rfl: 1 .  atorvastatin (LIPITOR) 40 MG tablet, Take by mouth., Disp: , Rfl:  .  benzonatate (TESSALON) 100 MG capsule, Take 1 capsule (100 mg total) by mouth 2 (two) times daily as needed for cough., Disp: 20 capsule, Rfl: 0 .  Cephalexin 500 MG tablet, Take 1 tablet (500 mg total) by mouth 2 (two) times daily., Disp: 13 tablet, Rfl: 0 .  clonazePAM (KLONOPIN) 0.5 MG tablet, Take 1 tablet (0.5 mg total) by mouth 2  (two) times daily., Disp: 180 tablet, Rfl: 0 .  cyclobenzaprine (FLEXERIL) 10 MG tablet, Take by mouth., Disp: , Rfl:  .  cycloSPORINE (RESTASIS) 0.05 % ophthalmic emulsion, Apply to eye., Disp: , Rfl:  .  diclofenac sodium (VOLTAREN) 1 % GEL, Apply topically., Disp: , Rfl:  .  fenofibrate (TRICOR) 145 MG tablet, Take 1 tablet (145 mg total) by mouth daily., Disp: 90 tablet, Rfl: 2 .  fluticasone (FLONASE) 50 MCG/ACT nasal spray, , Disp: , Rfl:  .  fluticasone (FLONASE) 50 MCG/ACT nasal spray, Place 2 sprays into both nostrils daily., Disp: 16 g, Rfl: 6 .  gabapentin (NEURONTIN) 300 MG capsule, Take by mouth., Disp: , Rfl:  .  HYDROcodone-acetaminophen (NORCO/VICODIN) 5-325 MG per tablet, , Disp: , Rfl:  .  lansoprazole (PREVACID) 30 MG capsule, Take 1 capsule (30 mg total) by mouth 2 (two) times daily before a meal., Disp: 180 capsule, Rfl: 3 .  levalbuterol (XOPENEX) 1.25 MG/0.5ML nebulizer solution, Inhale into the lungs., Disp: , Rfl:  .  levothyroxine (SYNTHROID) 88 MCG tablet, Take by mouth., Disp: , Rfl:  .  magnesium oxide (MAG-OX) 400 MG tablet, Take by mouth., Disp: , Rfl:  .  methocarbamol (ROBAXIN) 500 MG tablet,  Take by mouth., Disp: , Rfl:  .  mometasone-formoterol (DULERA) 200-5 MCG/ACT AERO, Inhale into the lungs., Disp: , Rfl:  .  montelukast (SINGULAIR) 10 MG tablet, Take 1 tablet (10 mg total) by mouth at bedtime., Disp: 80 tablet, Rfl: 3 .  ondansetron (ZOFRAN ODT) 4 MG disintegrating tablet, Take 1 tablet (4 mg total) by mouth every 8 (eight) hours as needed for nausea or vomiting., Disp: 20 tablet, Rfl: 0 .  ondansetron (ZOFRAN) 4 MG tablet, Take 1 tablet (4 mg total) by mouth every 8 (eight) hours as needed for nausea or vomiting., Disp: 20 tablet, Rfl: 0 .  oxyCODONE-acetaminophen (PERCOCET) 7.5-325 MG per tablet, Take 1 tablet by mouth every 8 (eight) hours as needed for severe pain., Disp: 90 tablet, Rfl: 0 .  Polyethyl Glycol-Propyl Glycol 0.4-0.3 % SOLN, Apply to eye.,  Disp: , Rfl:  .  predniSONE (DELTASONE) 20 MG tablet, Take 1 tablet (20 mg total) by mouth daily with breakfast., Disp: 10 tablet, Rfl: 0 .  promethazine (PHENERGAN) 25 MG tablet, Take by mouth., Disp: , Rfl:  .  tiZANidine (ZANAFLEX) 4 MG tablet, Take by mouth., Disp: , Rfl:  .  valsartan-hydrochlorothiazide (DIOVAN-HCT) 160-12.5 MG tablet, Take 1 tablet by mouth daily., Disp: 90 tablet, Rfl: 0 .  venlafaxine XR (EFFEXOR-XR) 37.5 MG 24 hr capsule, Take by mouth., Disp: , Rfl:  .  Vitamin D, Ergocalciferol, (DRISDOL) 50000 UNITS CAPS capsule, Take by mouth., Disp: , Rfl:   Current facility-administered medications:  .  albuterol (PROVENTIL) (2.5 MG/3ML) 0.083% nebulizer solution 2.5 mg, 2.5 mg, Nebulization, Once, Ashok Norris, MD  Allergies  Allergen Reactions  . Morphine Nausea Only and Nausea And Vomiting  . Diazepam Nausea And Vomiting    Other reaction(s): Vomiting  . Salicylates     Other reaction(s): Headache  . Rash Away  [Petrolatum-Zinc Oxide] Rash and Swelling    Review of Systems  Constitutional: Negative.   Genitourinary: Positive for dysuria, urgency, frequency, hematuria and flank pain.  Skin: Negative.      Objective  Filed Vitals:   12/22/14 1525  BP: 138/76  Pulse: 97  Temp: 98.4 F (36.9 C)  Resp: 16  Height: 5\' 4"  (1.626 m)  Weight: 219 lb 7 oz (99.536 kg)  SpO2: 96%     Physical Exam  Constitutional: She is well-developed, well-nourished, and in no distress.  HENT:  Head: Normocephalic.  Eyes: Pupils are equal, round, and reactive to light.  Abdominal: Soft. Bowel sounds are normal. There is no tenderness.  No CVA or suprapubic discomfort      Assessment & Plan

## 2014-12-23 ENCOUNTER — Encounter: Payer: Self-pay | Admitting: *Deleted

## 2014-12-24 ENCOUNTER — Ambulatory Visit: Payer: Medicare Other | Admitting: Urology

## 2014-12-25 ENCOUNTER — Telehealth: Payer: Self-pay

## 2014-12-25 DIAGNOSIS — M25561 Pain in right knee: Secondary | ICD-10-CM | POA: Diagnosis not present

## 2014-12-25 MED ORDER — VENLAFAXINE HCL ER 37.5 MG PO CP24: 37.5000 mg | ORAL_CAPSULE | Freq: Every day | ORAL | Status: DC

## 2014-12-25 NOTE — Telephone Encounter (Signed)
PT called in and stated that her medication increase had her more anxious than normal.Spoke with Dr. Manuella Ghazi and he suggested pt go back to lower dosage until pt able to see PCP due to no documentation as to why increase was ordered.

## 2014-12-29 ENCOUNTER — Other Ambulatory Visit: Payer: Self-pay | Admitting: Emergency Medicine

## 2014-12-30 ENCOUNTER — Ambulatory Visit (INDEPENDENT_AMBULATORY_CARE_PROVIDER_SITE_OTHER): Payer: Medicare Other | Admitting: Urology

## 2014-12-30 ENCOUNTER — Encounter: Payer: Self-pay | Admitting: Urology

## 2014-12-30 VITALS — BP 148/77 | HR 80 | Ht 64.0 in | Wt 223.7 lb

## 2014-12-30 DIAGNOSIS — I209 Angina pectoris, unspecified: Secondary | ICD-10-CM | POA: Diagnosis not present

## 2014-12-30 DIAGNOSIS — N390 Urinary tract infection, site not specified: Secondary | ICD-10-CM

## 2014-12-30 DIAGNOSIS — R310 Gross hematuria: Secondary | ICD-10-CM | POA: Diagnosis not present

## 2014-12-30 LAB — URINALYSIS, COMPLETE
Bilirubin, UA: NEGATIVE
Glucose, UA: NEGATIVE
Ketones, UA: NEGATIVE
Leukocytes, UA: NEGATIVE
Nitrite, UA: NEGATIVE
Protein, UA: NEGATIVE
RBC, UA: NEGATIVE
Specific Gravity, UA: 1.02 (ref 1.005–1.030)
Urobilinogen, Ur: 0.2 mg/dL (ref 0.2–1.0)
pH, UA: 7 (ref 5.0–7.5)

## 2014-12-30 LAB — MICROSCOPIC EXAMINATION
Bacteria, UA: NONE SEEN
RBC, UA: NONE SEEN /hpf (ref 0–?)
Renal Epithel, UA: NONE SEEN /hpf
WBC, UA: NONE SEEN /hpf (ref 0–?)

## 2014-12-30 NOTE — Progress Notes (Signed)
12/30/2014 8:55 AM   Jacqueline Lucas Nov 10, 1950 [id number]  Referring provider: Ashok Norris, MD [address] [place], [place] [address]  Chief Complaint  Patient presents with  . Hematuria    HPI: Patient is a 64 year old white female with a history of gross hematuria who completed a hematuria work up in 09/2013 with a CT Urogram and cystoscopy and no GU pathology was discovered presents today at the request of the ED for another episode of gross hematuria.  She was seen at [facility] ED on 11/22/2014 and was experiencing gross hematuria that time. She did have a positive urine culture for 40,000 colonies of Escherichia coli. She was treated with the appropriate antibiotic and hematuria abated.    Patient states that she has had difficulty with urinary tract infections since her knee replacement in 2015.  Her infections are heralded with gross hematuria and dysuria.  Review of her past medical history,  I can only find one positive urine culture over the last year and that was the time she was in the emergency room in September.  Today, she denies any dysuria, gross hematuria or suprapubic pain. She also is not experiencing fevers, chills, nausea or vomiting. Her UA is unremarkable.   PMH: Past Medical History  Diagnosis Date  . Hypertension   . Asthma   . Hyperlipidemia   . Thyroid disease   . Bronchitis   . Lumbar neuritis   . Shoulder fracture, right   . Anxiety   . Arthritis   . Allergy   . Cataract   . Depression   . GERD (gastroesophageal reflux disease)   . Cancer of both ovaries ([place])   . Insomnia   . RLS (restless legs syndrome)   . Migraines   . Chronic nausea   . Alzheimer disease   . Stroke ([place])   . Obesity   . Tachycardia   . Throat cancer ([place])   . Anginal pain ([place])   . Palpitation   . Fibromyalgia   . Heart murmur   . Hematuria   . Ovarian cancer ([place])   . IBS (irritable bowel syndrome)     Surgical History: Past Surgical  History  Procedure Laterality Date  . Replacement total knee Left   . Total hip arthroplasty Left 2013  . Back surgery N/A 2014  . Hemorrhoid surgery    . Abdominal hysterectomy      due to cancer of ovaries  . Spine surgery    . Tendonititis      right elbow, right wrist    Home Medications:    Medication List       This list is accurate as of: 12/30/14 11:59 PM.  Always use your most recent med list.               albuterol 108 (90 BASE) MCG/ACT inhaler  Commonly known as:  PROAIR HFA  Inhale 2 puffs into the lungs every 6 (six) hours as needed for wheezing or shortness of breath.     amoxicillin-clavulanate 875-125 MG tablet  Commonly known as:  AUGMENTIN  Take 1 tablet by mouth 2 (two) times daily.     aspirin EC 81 MG tablet  Take by mouth.     atorvastatin 40 MG tablet  Commonly known as:  LIPITOR  TAKE 1 TABLET BY MOUTH AT BEDTIME     benzonatate 100 MG capsule  Commonly known as:  TESSALON  Take 1 capsule (100 mg total) by mouth  2 (two) times daily as needed for cough.     Cephalexin 500 MG tablet  Take 1 tablet (500 mg total) by mouth 2 (two) times daily.     clonazePAM 0.5 MG tablet  Commonly known as:  KLONOPIN  Take 1 tablet (0.5 mg total) by mouth 2 (two) times daily.     cyclobenzaprine 10 MG tablet  Commonly known as:  FLEXERIL  Take by mouth.     cycloSPORINE 0.05 % ophthalmic emulsion  Commonly known as:  RESTASIS  Apply to eye.     diclofenac 75 MG EC tablet  Commonly known as:  VOLTAREN  TAKE 1 TABLET BY MOUTH TWICE A DAY WITH MEALS FOR 7 DAYS THEN AS NEEDED FOR PAIN     diclofenac sodium 1 % Gel  Commonly known as:  VOLTAREN  Apply topically.     fenofibrate 145 MG tablet  Commonly known as:  TRICOR  Take 1 tablet (145 mg total) by mouth daily.     fluticasone 50 MCG/ACT nasal spray  Commonly known as:  FLONASE     gabapentin 300 MG capsule  Commonly known as:  NEURONTIN  Take by mouth.     HYDROcodone-acetaminophen 5-325  MG tablet  Commonly known as:  NORCO/VICODIN     lansoprazole 30 MG capsule  Commonly known as:  PREVACID  Take 1 capsule (30 mg total) by mouth 2 (two) times daily before a meal.     levalbuterol 1.25 MG/0.5ML nebulizer solution  Commonly known as:  XOPENEX  Inhale into the lungs.     magnesium oxide 400 MG tablet  Commonly known as:  MAG-OX  Take by mouth.     methocarbamol 500 MG tablet  Commonly known as:  ROBAXIN  Take by mouth.     mometasone-formoterol 200-5 MCG/ACT Aero  Commonly known as:  DULERA  Inhale into the lungs.     montelukast 10 MG tablet  Commonly known as:  SINGULAIR  Take 1 tablet (10 mg total) by mouth at bedtime.     ondansetron 4 MG disintegrating tablet  Commonly known as:  ZOFRAN ODT  Take 1 tablet (4 mg total) by mouth every 8 (eight) hours as needed for nausea or vomiting.     ondansetron 4 MG tablet  Commonly known as:  ZOFRAN  Take 1 tablet (4 mg total) by mouth every 8 (eight) hours as needed for nausea or vomiting.     oxyCODONE-acetaminophen 7.5-325 MG tablet  Commonly known as:  PERCOCET  Take 1 tablet by mouth every 8 (eight) hours as needed for severe pain.     Polyethyl Glycol-Propyl Glycol 0.4-0.3 % Soln  Apply to eye.     predniSONE 20 MG tablet  Commonly known as:  DELTASONE  Take 1 tablet (20 mg total) by mouth daily with breakfast.     promethazine 25 MG tablet  Commonly known as:  PHENERGAN  Take by mouth.     SYNTHROID 88 MCG tablet  Generic drug:  levothyroxine  Take by mouth.     tiZANidine 4 MG tablet  Commonly known as:  ZANAFLEX  Take by mouth.     valsartan-hydrochlorothiazide 160-12.5 MG tablet  Commonly known as:  DIOVAN-HCT  Take 1 tablet by mouth daily.     venlafaxine XR 37.5 MG 24 hr capsule  Commonly known as:  EFFEXOR-XR  Take 1 capsule (37.5 mg total) by mouth daily with breakfast.     Vitamin D (Ergocalciferol) 50000 UNITS Caps capsule  Commonly known as:  DRISDOL  Take by mouth.         Allergies:  Allergies  Allergen Reactions  . Morphine Nausea Only and Nausea And Vomiting  . Diazepam Nausea And Vomiting    Other reaction(s): Vomiting  . Salicylates     Other reaction(s): Headache  . Tape   . Rash Away  [Petrolatum-Zinc Oxide] Rash and Swelling    Family History: Family History  Problem Relation Age of Onset  . Diabetes Mother   . Hypertension Mother   . Heart disease Mother   . Diabetes Father   . Cancer Father   . Hypertension Sister   . Depression Sister   . Cancer Brother     Social History:  reports that she has never smoked. She has never used smokeless tobacco. She reports that she does not drink alcohol or use illicit drugs.  ROS: UROLOGY Frequent Urination?: No Hard to postpone urination?: No Burning/pain with urination?: No Get up at night to urinate?: No Leakage of urine?: No Urine stream starts and stops?: No Trouble starting stream?: No Do you have to strain to urinate?: No Blood in urine?: No Urinary tract infection?: No Sexually transmitted disease?: No Injury to kidneys or bladder?: No Painful intercourse?: No Weak stream?: No Currently pregnant?: No Vaginal bleeding?: No Last menstrual period?: n  Gastrointestinal Nausea?: Yes Vomiting?: No Indigestion/heartburn?: Yes Diarrhea?: No Constipation?: No  Constitutional Fever: No Night sweats?: No Weight loss?: No Fatigue?: No  Skin Skin rash/lesions?: No Itching?: No  Eyes Blurred vision?: No Double vision?: No  Ears/Nose/Throat Sore throat?: No Sinus problems?: No  Hematologic/Lymphatic Swollen glands?: No Easy bruising?: Yes  Cardiovascular Leg swelling?: Yes Chest pain?: No  Respiratory Cough?: No Shortness of breath?: No  Endocrine Excessive thirst?: No  Musculoskeletal Back pain?: Yes Joint pain?: Yes  Neurological Headaches?: Yes Dizziness?: No  Psychologic Depression?: No Anxiety?: Yes  Physical Exam: BP 148/77 mmHg   Pulse 80  Ht 5\' 4"  (1.626 m)  Wt 223 lb 11.2 oz (101.47 kg)  BMI 38.38 kg/m2  Constitutional: Well nourished. Alert and oriented, No acute distress. HEENT: [place] AT, moist mucus membranes. Trachea midline, no masses. Cardiovascular: No clubbing, cyanosis, or edema. Respiratory: Normal respiratory effort, no increased work of breathing. GI: Abdomen is soft, non tender, non distended, no abdominal masses. Liver and spleen not palpable.  No hernias appreciated.  Stool sample for occult testing is not indicated.   GU: No CVA tenderness.  No bladder fullness or masses.   Skin: No rashes, bruises or suspicious lesions. Lymph: No cervical or inguinal adenopathy. Neurologic: Grossly intact, no focal deficits, moving all 4 extremities. Psychiatric: Normal mood and affect.  Laboratory Data: Lab Results  Component Value Date   WBC 6.6 11/22/2014   HGB 12.3 11/22/2014   HCT 37.3 11/22/2014   MCV 85.1 11/22/2014   PLT 226 11/22/2014    Lab Results  Component Value Date   CREATININE 0.94 11/22/2014    Lab Results  Component Value Date   HGBA1C 6.3 09/07/2014    Urinalysis Results for orders placed or performed in visit on 12/30/14  Microscopic Examination  Result Value Ref Range   WBC, UA None seen 0 -  5 /hpf   RBC, UA None seen 0 -  2 /hpf   Epithelial Cells (non renal) 0-10 0 - 10 /hpf   Renal Epithel, UA None seen None seen /hpf   Bacteria, UA None seen None seen/Few  Urinalysis, Complete  Result  Value Ref Range   Specific Gravity, UA 1.020 1.005 - 1.030   pH, UA 7.0 5.0 - 7.5   Color, UA Yellow Yellow   Appearance Ur Clear Clear   Leukocytes, UA Negative Negative   Protein, UA Negative Negative/Trace   Glucose, UA Negative Negative   Ketones, UA Negative Negative   RBC, UA Negative Negative   Bilirubin, UA Negative Negative   Urobilinogen, Ur 0.2 0.2 - 1.0 mg/dL   Nitrite, UA Negative Negative   Microscopic Examination See below:     Assessment & Plan:    1. Gross  hematuria:    Patient has completed a hematuria work up in 09/2013.  No GU pathology was discovered.  Her recent episode of gross hematuria was in association with an UTI.  Once the infection was treated, the hematuria resolved.  We will continue to monitor an UA on a yearly basis.  - Urinalysis, Complete  2. Recurrent urinary tract infections:   Per patient, she has suffered recurrent urinary tract infections since her knee replacement in 2015. I could only find one documented infection after reviewing her chart.  Her UA today was unremarkable.  Since she is postmenopausal, I will start her on vaginal estrogen cream to apply nightly with fingertip.  She will RTC in 2 weeks for exam.   Return in about 2 weeks (around 01/13/2015) for exam.  Zara Council, [facility]  [facility] [address], [place], [place] [address] [phone]

## 2014-12-31 DIAGNOSIS — R310 Gross hematuria: Secondary | ICD-10-CM | POA: Insufficient documentation

## 2014-12-31 DIAGNOSIS — M150 Primary generalized (osteo)arthritis: Secondary | ICD-10-CM | POA: Diagnosis not present

## 2014-12-31 DIAGNOSIS — E782 Mixed hyperlipidemia: Secondary | ICD-10-CM | POA: Diagnosis not present

## 2014-12-31 DIAGNOSIS — I10 Essential (primary) hypertension: Secondary | ICD-10-CM | POA: Diagnosis not present

## 2014-12-31 DIAGNOSIS — M810 Age-related osteoporosis without current pathological fracture: Secondary | ICD-10-CM | POA: Diagnosis not present

## 2014-12-31 DIAGNOSIS — F411 Generalized anxiety disorder: Secondary | ICD-10-CM | POA: Diagnosis not present

## 2014-12-31 DIAGNOSIS — E049 Nontoxic goiter, unspecified: Secondary | ICD-10-CM | POA: Diagnosis not present

## 2014-12-31 DIAGNOSIS — N390 Urinary tract infection, site not specified: Secondary | ICD-10-CM | POA: Insufficient documentation

## 2014-12-31 DIAGNOSIS — E039 Hypothyroidism, unspecified: Secondary | ICD-10-CM | POA: Diagnosis not present

## 2015-01-01 ENCOUNTER — Ambulatory Visit: Payer: Medicare Other | Admitting: Urology

## 2015-01-04 DIAGNOSIS — M797 Fibromyalgia: Secondary | ICD-10-CM | POA: Diagnosis not present

## 2015-01-04 DIAGNOSIS — R5383 Other fatigue: Secondary | ICD-10-CM | POA: Diagnosis not present

## 2015-01-04 DIAGNOSIS — G4709 Other insomnia: Secondary | ICD-10-CM | POA: Diagnosis not present

## 2015-01-04 DIAGNOSIS — M7071 Other bursitis of hip, right hip: Secondary | ICD-10-CM | POA: Diagnosis not present

## 2015-01-04 DIAGNOSIS — M7072 Other bursitis of hip, left hip: Secondary | ICD-10-CM | POA: Diagnosis not present

## 2015-01-06 ENCOUNTER — Other Ambulatory Visit: Payer: Self-pay | Admitting: Emergency Medicine

## 2015-01-06 ENCOUNTER — Ambulatory Visit: Payer: Medicare Other | Admitting: Family Medicine

## 2015-01-06 DIAGNOSIS — R208 Other disturbances of skin sensation: Secondary | ICD-10-CM | POA: Diagnosis not present

## 2015-01-06 DIAGNOSIS — L905 Scar conditions and fibrosis of skin: Secondary | ICD-10-CM | POA: Diagnosis not present

## 2015-01-06 MED ORDER — LEVALBUTEROL HCL 1.25 MG/3ML IN NEBU: 1.2500 mg | INHALATION_SOLUTION | RESPIRATORY_TRACT | Status: DC | PRN

## 2015-01-12 MED ORDER — VALSARTAN-HYDROCHLOROTHIAZIDE 160-12.5 MG PO TABS: 1.0000 | ORAL_TABLET | Freq: Every day | ORAL | Status: DC

## 2015-01-12 NOTE — Telephone Encounter (Signed)
Patient notified, script sent

## 2015-01-13 ENCOUNTER — Ambulatory Visit: Payer: Medicare Other | Admitting: Urology

## 2015-01-20 ENCOUNTER — Other Ambulatory Visit: Payer: Self-pay | Admitting: Family Medicine

## 2015-01-20 DIAGNOSIS — E049 Nontoxic goiter, unspecified: Secondary | ICD-10-CM | POA: Diagnosis not present

## 2015-01-20 DIAGNOSIS — E782 Mixed hyperlipidemia: Secondary | ICD-10-CM | POA: Diagnosis not present

## 2015-01-20 DIAGNOSIS — M150 Primary generalized (osteo)arthritis: Secondary | ICD-10-CM | POA: Diagnosis not present

## 2015-01-20 DIAGNOSIS — F411 Generalized anxiety disorder: Secondary | ICD-10-CM | POA: Diagnosis not present

## 2015-01-20 DIAGNOSIS — I10 Essential (primary) hypertension: Secondary | ICD-10-CM | POA: Diagnosis not present

## 2015-01-20 DIAGNOSIS — E039 Hypothyroidism, unspecified: Secondary | ICD-10-CM | POA: Diagnosis not present

## 2015-01-20 DIAGNOSIS — E071 Dyshormogenetic goiter: Secondary | ICD-10-CM | POA: Diagnosis not present

## 2015-01-20 DIAGNOSIS — M810 Age-related osteoporosis without current pathological fracture: Secondary | ICD-10-CM | POA: Diagnosis not present

## 2015-01-25 ENCOUNTER — Telehealth: Payer: Self-pay | Admitting: Family Medicine

## 2015-01-25 DIAGNOSIS — F419 Anxiety disorder, unspecified: Secondary | ICD-10-CM

## 2015-01-25 MED ORDER — CLONAZEPAM 0.5 MG PO TABS: 0.5000 mg | ORAL_TABLET | Freq: Two times a day (BID) | ORAL | Status: DC

## 2015-01-25 NOTE — Telephone Encounter (Signed)
Patient was verbally informed that prescription has been faxed to her pharmacy.

## 2015-01-25 NOTE — Telephone Encounter (Signed)
Script has been printed and and faxed to [facility]

## 2015-01-25 NOTE — Telephone Encounter (Signed)
Requesting [facility]ill [facility]. [facility] She has one pill left.

## 2015-01-26 ENCOUNTER — Telehealth: Payer: Self-pay | Admitting: Family Medicine

## 2015-01-26 ENCOUNTER — Ambulatory Visit: Payer: Medicare Other | Admitting: Family Medicine

## 2015-01-26 NOTE — Telephone Encounter (Signed)
I will forward to Dr. Rutherford Nail since pt has enough to last her for 2 days I will have him refill it when he comes back.

## 2015-01-26 NOTE — Telephone Encounter (Signed)
Pt needs refill on Oxycodone. She has enough for 2 more days.

## 2015-01-27 ENCOUNTER — Ambulatory Visit (INDEPENDENT_AMBULATORY_CARE_PROVIDER_SITE_OTHER): Payer: Medicare Other | Admitting: Urology

## 2015-01-27 ENCOUNTER — Encounter: Payer: Self-pay | Admitting: Urology

## 2015-01-27 ENCOUNTER — Other Ambulatory Visit: Payer: Self-pay | Admitting: Family Medicine

## 2015-01-27 VITALS — BP 143/76 | HR 74 | Ht 64.0 in | Wt 222.6 lb

## 2015-01-27 DIAGNOSIS — R310 Gross hematuria: Secondary | ICD-10-CM

## 2015-01-27 DIAGNOSIS — N952 Postmenopausal atrophic vaginitis: Secondary | ICD-10-CM | POA: Diagnosis not present

## 2015-01-27 DIAGNOSIS — G8929 Other chronic pain: Secondary | ICD-10-CM

## 2015-01-27 DIAGNOSIS — I209 Angina pectoris, unspecified: Secondary | ICD-10-CM | POA: Diagnosis not present

## 2015-01-27 DIAGNOSIS — N390 Urinary tract infection, site not specified: Secondary | ICD-10-CM

## 2015-01-27 MED ORDER — ESTRADIOL 0.1 MG/GM VA CREA: TOPICAL_CREAM | VAGINAL | Status: DC

## 2015-01-27 MED ORDER — OXYCODONE-ACETAMINOPHEN 7.5-325 MG PO TABS: 1.0000 | ORAL_TABLET | Freq: Three times a day (TID) | ORAL | Status: DC | PRN

## 2015-01-27 NOTE — Progress Notes (Signed)
8:04 PM   Lizzett Quigg [facility] [birth year] [id number]  Referring provider: Ashok Norris, MD [address] [place], [place] [address]  Chief Complaint  Patient presents with  . Follow-up    Recheck on medication    HPI: Patient is 64 year old white female with a history of recurrent urinary tract infection who was found to have atrophic vaginitis and was placed on vaginal estrogen cream who presents today for her 2 week follow-up.  Background history Patient is a 64 year old white female with a history of gross hematuria who completed a hematuria work up in 09/2013 with a CT Urogram and cystoscopy and no GU pathology was discovered presents today at the request of the ED for another episode of gross hematuria.  She was seen at [facility] ED on 11/22/2014 and was experiencing gross hematuria that time. She did have a positive urine culture for 40,000 colonies of Escherichia coli. She was treated with the appropriate antibiotic and hematuria abated.  Patient states that she has had difficulty with urinary tract infections since her knee replacement in 2015.  Her infections are heralded with gross hematuria and dysuria.  Review of her past medical history,  I can only find one positive urine culture over the last year and that was the time she was in the emergency room in September.  Today, she denies any dysuria, gross hematuria or suprapubic pain.  Her baseline urinary symptoms consist of nocturia.   She also is not experiencing fevers, chills, nausea or vomiting.   She's not had any difficulty of applying the vaginal cream.  She is not experiencing any irritation or rash with the cream.  Her insurance will cover the medication, so she will be able to continue it.   PMH: Past Medical History  Diagnosis Date  . Hypertension   . Asthma   . Hyperlipidemia   . Thyroid disease   . Bronchitis   . Lumbar neuritis   . Shoulder fracture, right   . Anxiety   . Arthritis   . Allergy    . Cataract   . Depression   . GERD (gastroesophageal reflux disease)   . Cancer of both ovaries ([place])   . Insomnia   . RLS (restless legs syndrome)   . Migraines   . Chronic nausea   . Alzheimer disease   . Stroke ([place])   . Obesity   . Tachycardia   . Throat cancer ([place])   . Anginal pain ([place])   . Palpitation   . Fibromyalgia   . Heart murmur   . Hematuria   . Ovarian cancer ([place])   . IBS (irritable bowel syndrome)     Surgical History: Past Surgical History  Procedure Laterality Date  . Replacement total knee Left   . Total hip arthroplasty Left 2013  . Back surgery N/A 2014    x 3  . Hemorrhoid surgery    . Abdominal hysterectomy      due to cancer of ovaries  . Spine surgery      x 2  . Tendonititis      right elbow, right wrist    Home Medications:    Medication List       This list is accurate as of: 01/27/15 11:59 PM.  Always use your most recent med list.               albuterol 108 (90 BASE) MCG/ACT inhaler  Commonly known as:  PROAIR HFA  Inhale 2 puffs  into the lungs every 6 (six) hours as needed for wheezing or shortness of breath.     amoxicillin-clavulanate 875-125 MG tablet  Commonly known as:  AUGMENTIN  Take 1 tablet by mouth 2 (two) times daily.     aspirin EC 81 MG tablet  Take by mouth.     atorvastatin 40 MG tablet  Commonly known as:  LIPITOR  TAKE 1 TABLET BY MOUTH AT BEDTIME     baclofen 10 MG tablet  Commonly known as:  LIORESAL     benzonatate 100 MG capsule  Commonly known as:  TESSALON  Take 1 capsule (100 mg total) by mouth 2 (two) times daily as needed for cough.     Cephalexin 500 MG tablet  Take 1 tablet (500 mg total) by mouth 2 (two) times daily.     clonazePAM 0.5 MG tablet  Commonly known as:  KLONOPIN  Take 1 tablet (0.5 mg total) by mouth 2 (two) times daily.     cyclobenzaprine 10 MG tablet  Commonly known as:  FLEXERIL  Take by mouth.     cycloSPORINE 0.05 % ophthalmic emulsion  Commonly known  as:  RESTASIS  Apply to eye.     diclofenac 75 MG EC tablet  Commonly known as:  VOLTAREN  TAKE 1 TABLET BY MOUTH TWICE A DAY WITH MEALS FOR 7 DAYS THEN AS NEEDED FOR PAIN     diclofenac sodium 1 % Gel  Commonly known as:  VOLTAREN  Apply topically.     estradiol 0.1 MG/GM vaginal cream  Commonly known as:  ESTRACE  Apply 0.5mg  (pea-sized amount)  just inside the vaginal introitus with a finger-tip every night for two weeks and then Monday, Wednesday and Friday nights.  adverse systemic effects that oral HT.     fenofibrate 145 MG tablet  Commonly known as:  TRICOR  Take 1 tablet (145 mg total) by mouth daily.     fluticasone 50 MCG/ACT nasal spray  Commonly known as:  FLONASE     gabapentin 300 MG capsule  Commonly known as:  NEURONTIN  Take by mouth.     HYDROcodone-acetaminophen 5-325 MG tablet  Commonly known as:  NORCO/VICODIN     lansoprazole 30 MG capsule  Commonly known as:  PREVACID  Take 1 capsule (30 mg total) by mouth 2 (two) times daily before a meal.     levalbuterol 1.25 MG/3ML nebulizer solution  Commonly known as:  XOPENEX  Take 1.25 mg by nebulization every 4 (four) hours as needed for wheezing.     magnesium oxide 400 MG tablet  Commonly known as:  MAG-OX  Take by mouth.     methocarbamol 500 MG tablet  Commonly known as:  ROBAXIN  Take by mouth.     mometasone-formoterol 200-5 MCG/ACT Aero  Commonly known as:  DULERA  Inhale into the lungs.     montelukast 10 MG tablet  Commonly known as:  SINGULAIR  Take 1 tablet (10 mg total) by mouth at bedtime.     ondansetron 4 MG disintegrating tablet  Commonly known as:  ZOFRAN ODT  Take 1 tablet (4 mg total) by mouth every 8 (eight) hours as needed for nausea or vomiting.     ondansetron 4 MG tablet  Commonly known as:  ZOFRAN  Take 1 tablet (4 mg total) by mouth every 8 (eight) hours as needed for nausea or vomiting.     oxyCODONE-acetaminophen 7.5-325 MG tablet  Commonly known as:  PERCOCET  Take 1 tablet by mouth every 8 (eight) hours as needed for severe pain.     Polyethyl Glycol-Propyl Glycol 0.4-0.3 % Soln  Apply to eye.     predniSONE 20 MG tablet  Commonly known as:  DELTASONE  Take 1 tablet (20 mg total) by mouth daily with breakfast.     promethazine 25 MG tablet  Commonly known as:  PHENERGAN  Take by mouth.     SYNTHROID 88 MCG tablet  Generic drug:  levothyroxine  Take by mouth.     tiZANidine 4 MG tablet  Commonly known as:  ZANAFLEX  Take by mouth.     valsartan-hydrochlorothiazide 160-12.5 MG tablet  Commonly known as:  DIOVAN-HCT  Take 1 tablet by mouth daily.     venlafaxine XR 37.5 MG 24 hr capsule  Commonly known as:  EFFEXOR-XR  Take 1 capsule (37.5 mg total) by mouth daily with breakfast.     Vitamin D (Ergocalciferol) 50000 UNITS Caps capsule  Commonly known as:  DRISDOL  Take by mouth.        Allergies:  Allergies  Allergen Reactions  . Morphine Nausea Only and Nausea And Vomiting  . Diazepam Nausea And Vomiting    Other reaction(s): Vomiting  . Salicylates     Other reaction(s): Headache  . Tape   . Rash Away  [Petrolatum-Zinc Oxide] Rash and Swelling    Family History: Family History  Problem Relation Age of Onset  . Diabetes Mother   . Hypertension Mother   . Heart disease Mother   . Diabetes Father   . Cancer Father   . Hypertension Sister   . Depression Sister   . Cancer Brother   . Bladder Cancer Neg Hx   . Kidney disease Neg Hx     Social History:  reports that she has never smoked. She has never used smokeless tobacco. She reports that she does not drink alcohol or use illicit drugs.  ROS: UROLOGY Frequent Urination?: No Hard to postpone urination?: No Burning/pain with urination?: No Get up at night to urinate?: Yes Leakage of urine?: No Urine stream starts and stops?: No Trouble starting stream?: No Do you have to strain to urinate?: No Blood in urine?: No Urinary tract infection?: No Sexually  transmitted disease?: No Injury to kidneys or bladder?: No Painful intercourse?: No Weak stream?: No Currently pregnant?: No Vaginal bleeding?: No Last menstrual period?: n  Gastrointestinal Nausea?: No Vomiting?: No Indigestion/heartburn?: No Diarrhea?: No Constipation?: No  Constitutional Fever: No Night sweats?: No Weight loss?: No Fatigue?: Yes  Skin Skin rash/lesions?: No Itching?: Yes  Eyes Blurred vision?: No Double vision?: No  Ears/Nose/Throat Sore throat?: No Sinus problems?: No  Hematologic/Lymphatic Swollen glands?: No Easy bruising?: No  Cardiovascular Leg swelling?: No Chest pain?: No  Respiratory Cough?: No Shortness of breath?: No  Endocrine Excessive thirst?: No  Musculoskeletal Back pain?: Yes Joint pain?: No  Neurological Headaches?: Yes Dizziness?: No  Psychologic Depression?: No Anxiety?: Yes  Physical Exam: Blood pressure 143/76, pulse 74, height 5\' 4"  (1.626 m), weight 222 lb 9.6 oz (100.971 kg). GU:  Atrophic external genitalia, normal pubic hair distribution, no lesions.  Normal urethral meatus, no lesions, no prolapse, no discharge.   No urethral masses, tenderness and/or tenderness. No bladder fullness, tenderness or masses. Atrophic vagina mucosa, poor estrogen effect, no discharge, no lesions.  Cervix and uterus are surgically absent. No pelvic masses are noted.  Anus and perineum are without rashes or lesions.    Laboratory Data:  Lab Results  Component Value Date   WBC 6.6 11/22/2014   HGB 12.3 11/22/2014   HCT 37.3 11/22/2014   MCV 85.1 11/22/2014   PLT 226 11/22/2014    Lab Results  Component Value Date   CREATININE 0.94 11/22/2014    Lab Results  Component Value Date   HGBA1C 6.3 09/07/2014    Urinalysis Results for orders placed or performed in visit on 12/30/14  Microscopic Examination  Result Value Ref Range   WBC, UA None seen 0 -  5 /hpf   RBC, UA None seen 0 -  2 /hpf   Epithelial Cells  (non renal) 0-10 0 - 10 /hpf   Renal Epithel, UA None seen None seen /hpf   Bacteria, UA None seen None seen/Few  Urinalysis, Complete  Result Value Ref Range   Specific Gravity, UA 1.020 1.005 - 1.030   pH, UA 7.0 5.0 - 7.5   Color, UA Yellow Yellow   Appearance Ur Clear Clear   Leukocytes, UA Negative Negative   Protein, UA Negative Negative/Trace   Glucose, UA Negative Negative   Ketones, UA Negative Negative   RBC, UA Negative Negative   Bilirubin, UA Negative Negative   Urobilinogen, Ur 0.2 0.2 - 1.0 mg/dL   Nitrite, UA Negative Negative   Microscopic Examination See below:     Assessment & Plan:    1. Gross hematuria:    Patient has completed a hematuria work up in 09/2013.  No GU pathology was discovered.  Her recent episode of gross hematuria was in association with an UTI.  Once the infection was treated, the hematuria resolved.  We will continue to monitor an UA on a yearly basis.  - Urinalysis, Complete  2. Recurrent urinary tract infections:   Per patient, she has suffered recurrent urinary tract infections since her knee replacement in 2015. I could only find one documented infection after reviewing her chart.  Patient is not having difficulty with the vaginal estrogen cream. She will continue to apply a 3 nights weekly. She will follow-up in 3 months time for symptom recheck and exam. She is to contact the office if she should experience any breakthrough urinary tract infections.  3. Atrophic vaginitis:   Patient is not having difficulty with the vaginal estrogen cream. She will continue to apply a 3 nights weekly. She will follow-up in 3 months time for symptom recheck and exam.   Return in about 3 months (around 04/27/2015) for exam.  Zara Council, [facility] [address] [place], [place] [address] [phone]

## 2015-01-28 ENCOUNTER — Ambulatory Visit: Payer: Medicare Other | Admitting: Family Medicine

## 2015-01-28 ENCOUNTER — Other Ambulatory Visit: Payer: Self-pay | Admitting: Family Medicine

## 2015-01-28 DIAGNOSIS — M1711 Unilateral primary osteoarthritis, right knee: Secondary | ICD-10-CM | POA: Diagnosis not present

## 2015-01-28 DIAGNOSIS — G8929 Other chronic pain: Secondary | ICD-10-CM

## 2015-01-28 MED ORDER — OXYCODONE-ACETAMINOPHEN 7.5-325 MG PO TABS: 1.0000 | ORAL_TABLET | Freq: Three times a day (TID) | ORAL | Status: DC | PRN

## 2015-01-29 DIAGNOSIS — M7989 Other specified soft tissue disorders: Secondary | ICD-10-CM | POA: Diagnosis not present

## 2015-01-29 DIAGNOSIS — I8311 Varicose veins of right lower extremity with inflammation: Secondary | ICD-10-CM | POA: Diagnosis not present

## 2015-01-29 DIAGNOSIS — I872 Venous insufficiency (chronic) (peripheral): Secondary | ICD-10-CM | POA: Diagnosis not present

## 2015-01-29 DIAGNOSIS — M79609 Pain in unspecified limb: Secondary | ICD-10-CM | POA: Diagnosis not present

## 2015-01-29 DIAGNOSIS — I8310 Varicose veins of unspecified lower extremity with inflammation: Secondary | ICD-10-CM | POA: Diagnosis not present

## 2015-01-31 DIAGNOSIS — N952 Postmenopausal atrophic vaginitis: Secondary | ICD-10-CM | POA: Insufficient documentation

## 2015-02-01 ENCOUNTER — Ambulatory Visit: Payer: Medicare Other | Admitting: Family Medicine

## 2015-02-01 ENCOUNTER — Telehealth: Payer: Self-pay | Admitting: Urology

## 2015-02-01 DIAGNOSIS — I872 Venous insufficiency (chronic) (peripheral): Secondary | ICD-10-CM | POA: Diagnosis not present

## 2015-02-01 DIAGNOSIS — E039 Hypothyroidism, unspecified: Secondary | ICD-10-CM | POA: Diagnosis not present

## 2015-02-01 DIAGNOSIS — M150 Primary generalized (osteo)arthritis: Secondary | ICD-10-CM | POA: Diagnosis not present

## 2015-02-01 DIAGNOSIS — I8310 Varicose veins of unspecified lower extremity with inflammation: Secondary | ICD-10-CM | POA: Diagnosis not present

## 2015-02-01 DIAGNOSIS — I8311 Varicose veins of right lower extremity with inflammation: Secondary | ICD-10-CM | POA: Diagnosis not present

## 2015-02-01 DIAGNOSIS — F411 Generalized anxiety disorder: Secondary | ICD-10-CM | POA: Diagnosis not present

## 2015-02-01 DIAGNOSIS — M810 Age-related osteoporosis without current pathological fracture: Secondary | ICD-10-CM | POA: Diagnosis not present

## 2015-02-01 DIAGNOSIS — E049 Nontoxic goiter, unspecified: Secondary | ICD-10-CM | POA: Diagnosis not present

## 2015-02-01 DIAGNOSIS — M7989 Other specified soft tissue disorders: Secondary | ICD-10-CM | POA: Diagnosis not present

## 2015-02-01 DIAGNOSIS — I10 Essential (primary) hypertension: Secondary | ICD-10-CM | POA: Diagnosis not present

## 2015-02-01 DIAGNOSIS — E782 Mixed hyperlipidemia: Secondary | ICD-10-CM | POA: Diagnosis not present

## 2015-02-01 DIAGNOSIS — M79609 Pain in unspecified limb: Secondary | ICD-10-CM | POA: Diagnosis not present

## 2015-02-01 NOTE — Telephone Encounter (Signed)
Pt needed refill on Estrace and it was called in to her local pharmacy and should have been sent to [facility] mail order.  Please correct prescription refill and send to [facility].  Please call pt and her husband at [phone] if you have any questions.

## 2015-02-02 ENCOUNTER — Other Ambulatory Visit: Payer: Self-pay | Admitting: *Deleted

## 2015-02-02 MED ORDER — ESTRADIOL 0.1 MG/GM VA CREA: TOPICAL_CREAM | VAGINAL | Status: DC

## 2015-02-02 NOTE — Telephone Encounter (Signed)
LVM : Rx for Estrace has been sent to [place].

## 2015-02-03 DIAGNOSIS — J209 Acute bronchitis, unspecified: Secondary | ICD-10-CM | POA: Diagnosis not present

## 2015-02-10 ENCOUNTER — Other Ambulatory Visit (HOSPITAL_COMMUNITY): Payer: Self-pay | Admitting: Orthopedic Surgery

## 2015-02-15 ENCOUNTER — Ambulatory Visit (INDEPENDENT_AMBULATORY_CARE_PROVIDER_SITE_OTHER): Payer: Medicare Other | Admitting: Family Medicine

## 2015-02-15 ENCOUNTER — Encounter: Payer: Self-pay | Admitting: Family Medicine

## 2015-02-15 VITALS — BP 118/72 | HR 86 | Temp 98.7°F | Resp 18 | Ht 64.0 in | Wt 226.2 lb

## 2015-02-15 DIAGNOSIS — I10 Essential (primary) hypertension: Secondary | ICD-10-CM | POA: Diagnosis not present

## 2015-02-15 DIAGNOSIS — K589 Irritable bowel syndrome without diarrhea: Secondary | ICD-10-CM

## 2015-02-15 DIAGNOSIS — R233 Spontaneous ecchymoses: Secondary | ICD-10-CM

## 2015-02-15 DIAGNOSIS — Z23 Encounter for immunization: Secondary | ICD-10-CM | POA: Diagnosis not present

## 2015-02-15 DIAGNOSIS — M150 Primary generalized (osteo)arthritis: Secondary | ICD-10-CM

## 2015-02-15 DIAGNOSIS — F330 Major depressive disorder, recurrent, mild: Secondary | ICD-10-CM

## 2015-02-15 DIAGNOSIS — I471 Supraventricular tachycardia: Secondary | ICD-10-CM

## 2015-02-15 DIAGNOSIS — M797 Fibromyalgia: Secondary | ICD-10-CM | POA: Diagnosis not present

## 2015-02-15 DIAGNOSIS — I209 Angina pectoris, unspecified: Secondary | ICD-10-CM

## 2015-02-15 DIAGNOSIS — K2100 Gastro-esophageal reflux disease with esophagitis, without bleeding: Secondary | ICD-10-CM

## 2015-02-15 DIAGNOSIS — J4540 Moderate persistent asthma, uncomplicated: Secondary | ICD-10-CM | POA: Diagnosis not present

## 2015-02-15 DIAGNOSIS — M159 Polyosteoarthritis, unspecified: Secondary | ICD-10-CM

## 2015-02-15 DIAGNOSIS — F419 Anxiety disorder, unspecified: Secondary | ICD-10-CM

## 2015-02-15 DIAGNOSIS — R238 Other skin changes: Secondary | ICD-10-CM | POA: Diagnosis not present

## 2015-02-15 DIAGNOSIS — G8929 Other chronic pain: Secondary | ICD-10-CM | POA: Diagnosis not present

## 2015-02-15 DIAGNOSIS — G2581 Restless legs syndrome: Secondary | ICD-10-CM | POA: Diagnosis not present

## 2015-02-15 DIAGNOSIS — R5383 Other fatigue: Secondary | ICD-10-CM | POA: Diagnosis not present

## 2015-02-15 MED ORDER — OXYCODONE-ACETAMINOPHEN 7.5-325 MG PO TABS: 1.0000 | ORAL_TABLET | Freq: Three times a day (TID) | ORAL | Status: DC | PRN

## 2015-02-15 MED ORDER — PREDNISONE 20 MG PO TABS: 20.0000 mg | ORAL_TABLET | Freq: Every day | ORAL | Status: DC

## 2015-02-15 MED ORDER — ATORVASTATIN CALCIUM 40 MG PO TABS: 40.0000 mg | ORAL_TABLET | Freq: Every day | ORAL | Status: DC

## 2015-02-15 NOTE — Progress Notes (Signed)
Name: Jacqueline Lucas   MRN: [id number]    DOB: November 14, 1950   Date:02/15/2015       Progress Note  Subjective  Chief Complaint  Chief Complaint  Patient presents with  . Pain    pt here for 3 month pain med refills    HPI   Chronic pain  Patient presents for follow-up of chronic pain syndrome. The pain score at worse is 10/10  and at best is 4/10 with medication rest and home remedies.  There is no evidence of any extra dosage or issues of usage outside of the prescribed regimen.  Pain is exacerbated by changes in weather and by strenuous activities. There have been no recent activities to exacerbate the chronic pain. Concomitant medications include Percocet 7.5-325 every 8 hours along with Voltaren gel and Flexeril.  Drug screen and medication contract have been renewed within the last 1 year.   Fatigue  History of disabling fatigue for number of years for chronic fatigue fibromyalgia. She finds it difficult to arise in the morning without significant pain and muscle weakness. She has seen a rheumatologist and past and numerous treatment trials failed.   Joint pain and swelling  Patient has known fibromyalgia as well as osteoarthritis and has has significant degenerative disc disease. Denies complaining of some joint swelling in the hands as well. No fever or chills there's some morning stiffness.   Fibromyalgia  Chronic fibromyalgia pain and fatigue for over 5 years. She is being treated for chronic pain and has also been on antidepressant as well.  Asthma  Some flare of asthma with recent weather change. She is currently on Dulera 200. Cough is usually nonproductive. Needs a nebulizer for home usage.     Past Medical History  Diagnosis Date  . Hypertension   . Asthma   . Hyperlipidemia   . Thyroid disease   . Bronchitis   . Lumbar neuritis   . Shoulder fracture, right   . Anxiety   . Arthritis   . Allergy   . Cataract   . Depression   . GERD  (gastroesophageal reflux disease)   . Cancer of both ovaries ([place])   . Insomnia   . RLS (restless legs syndrome)   . Migraines   . Chronic nausea   . Alzheimer disease   . Stroke ([place])   . Obesity   . Tachycardia   . Throat cancer ([place])   . Anginal pain ([place])   . Palpitation   . Fibromyalgia   . Heart murmur   . Hematuria   . Ovarian cancer ([place])   . IBS (irritable bowel syndrome)     Social History  Substance Use Topics  . Smoking status: Never Smoker   . Smokeless tobacco: Never Used  . Alcohol Use: No     Current outpatient prescriptions:  .  albuterol (PROAIR HFA) 108 (90 BASE) MCG/ACT inhaler, Inhale 2 puffs into the lungs every 6 (six) hours as needed for wheezing or shortness of breath., Disp: 1 Inhaler, Rfl: 1 .  amoxicillin-clavulanate (AUGMENTIN) 875-125 MG tablet, Take 1 tablet by mouth 2 (two) times daily. (Patient not taking: Reported on 12/30/2014), Disp: 20 tablet, Rfl: 0 .  aspirin EC 81 MG tablet, Take by mouth., Disp: , Rfl:  .  atorvastatin (LIPITOR) 40 MG tablet, TAKE 1 TABLET BY MOUTH AT BEDTIME, Disp: 90 tablet, Rfl: 1 .  baclofen (LIORESAL) 10 MG tablet, , Disp: , Rfl:  .  benzonatate (TESSALON) 100 MG capsule, Take  1 capsule (100 mg total) by mouth 2 (two) times daily as needed for cough. (Patient not taking: Reported on 12/30/2014), Disp: 20 capsule, Rfl: 0 .  Cephalexin 500 MG tablet, Take 1 tablet (500 mg total) by mouth 2 (two) times daily. (Patient not taking: Reported on 12/30/2014), Disp: 13 tablet, Rfl: 0 .  clonazePAM (KLONOPIN) 0.5 MG tablet, Take 1 tablet (0.5 mg total) by mouth 2 (two) times daily., Disp: 180 tablet, Rfl: 0 .  cyclobenzaprine (FLEXERIL) 10 MG tablet, Take by mouth., Disp: , Rfl:  .  cycloSPORINE (RESTASIS) 0.05 % ophthalmic emulsion, Apply to eye., Disp: , Rfl:  .  diclofenac (VOLTAREN) 75 MG EC tablet, TAKE 1 TABLET BY MOUTH TWICE A DAY WITH MEALS FOR 7 DAYS THEN AS NEEDED FOR PAIN, Disp: , Rfl: 1 .  diclofenac sodium  (VOLTAREN) 1 % GEL, Apply topically., Disp: , Rfl:  .  estradiol (ESTRACE) 0.1 MG/GM vaginal cream, Apply 0.70m (pea-sized amount)  just inside the vaginal introitus with a finger-tip every night for two weeks and then Monday, Wednesday and Friday nights.  adverse systemic effects that oral HT., Disp: 127.5 g, Rfl: 3 .  fenofibrate (TRICOR) 145 MG tablet, Take 1 tablet (145 mg total) by mouth daily., Disp: 90 tablet, Rfl: 2 .  fluticasone (FLONASE) 50 MCG/ACT nasal spray, , Disp: , Rfl:  .  gabapentin (NEURONTIN) 300 MG capsule, Take by mouth., Disp: , Rfl:  .  HYDROcodone-acetaminophen (NORCO/VICODIN) 5-325 MG per tablet, , Disp: , Rfl:  .  lansoprazole (PREVACID) 30 MG capsule, Take 1 capsule (30 mg total) by mouth 2 (two) times daily before a meal., Disp: 180 capsule, Rfl: 3 .  levalbuterol (XOPENEX) 1.25 MG/3ML nebulizer solution, Take 1.25 mg by nebulization every 4 (four) hours as needed for wheezing., Disp: 72 mL, Rfl: 4 .  levothyroxine (SYNTHROID) 88 MCG tablet, Take by mouth., Disp: , Rfl:  .  magnesium oxide (MAG-OX) 400 MG tablet, Take by mouth., Disp: , Rfl:  .  methocarbamol (ROBAXIN) 500 MG tablet, Take by mouth., Disp: , Rfl:  .  mometasone-formoterol (DULERA) 200-5 MCG/ACT AERO, Inhale into the lungs., Disp: , Rfl:  .  montelukast (SINGULAIR) 10 MG tablet, Take 1 tablet (10 mg total) by mouth at bedtime., Disp: 80 tablet, Rfl: 3 .  ondansetron (ZOFRAN ODT) 4 MG disintegrating tablet, Take 1 tablet (4 mg total) by mouth every 8 (eight) hours as needed for nausea or vomiting. (Patient not taking: Reported on 01/27/2015), Disp: 20 tablet, Rfl: 0 .  ondansetron (ZOFRAN) 4 MG tablet, Take 1 tablet (4 mg total) by mouth every 8 (eight) hours as needed for nausea or vomiting., Disp: 20 tablet, Rfl: 0 .  oxyCODONE-acetaminophen (PERCOCET) 7.5-325 MG tablet, Take 1 tablet by mouth every 8 (eight) hours as needed for severe pain., Disp: 90 tablet, Rfl: 0 .  Polyethyl Glycol-Propyl Glycol  0.4-0.3 % SOLN, Apply to eye., Disp: , Rfl:  .  predniSONE (DELTASONE) 20 MG tablet, Take 1 tablet (20 mg total) by mouth daily with breakfast. (Patient not taking: Reported on 01/27/2015), Disp: 10 tablet, Rfl: 0 .  promethazine (PHENERGAN) 25 MG tablet, Take by mouth., Disp: , Rfl:  .  tiZANidine (ZANAFLEX) 4 MG tablet, Take by mouth., Disp: , Rfl:  .  valsartan-hydrochlorothiazide (DIOVAN-HCT) 160-12.5 MG tablet, Take 1 tablet by mouth daily., Disp: 90 tablet, Rfl: 0 .  venlafaxine XR (EFFEXOR-XR) 37.5 MG 24 hr capsule, Take 1 capsule (37.5 mg total) by mouth daily with breakfast., Disp: 30  capsule, Rfl: 0 .  Vitamin D, Ergocalciferol, (DRISDOL) 50000 UNITS CAPS capsule, Take by mouth., Disp: , Rfl:   Current facility-administered medications:  .  albuterol (PROVENTIL) (2.5 MG/3ML) 0.083% nebulizer solution 2.5 mg, 2.5 mg, Nebulization, Once, Ashok Norris, MD  Allergies  Allergen Reactions  . Morphine Nausea Only and Nausea And Vomiting  . Diazepam Nausea And Vomiting    Other reaction(s): Vomiting  . Salicylates     Other reaction(s): Headache  . Tape   . Rash Away  [Petrolatum-Zinc Oxide] Rash and Swelling    Review of Systems  Constitutional: Positive for malaise/fatigue. Negative for fever, chills and weight loss.  HENT: Negative for congestion, hearing loss, sore throat and tinnitus.   Eyes: Negative for blurred vision, double vision and redness.  Respiratory: Positive for cough, shortness of breath and wheezing. Negative for hemoptysis.   Cardiovascular: Negative for chest pain, palpitations, orthopnea, claudication and leg swelling.  Gastrointestinal: Negative for heartburn, nausea, vomiting, diarrhea, constipation and blood in stool.  Genitourinary: Negative for dysuria, urgency, frequency and hematuria.  Musculoskeletal: Positive for back pain, joint pain and neck pain. Negative for myalgias and falls.  Skin: Negative for itching.  Neurological: Positive for headaches.  Negative for dizziness, tingling, tremors, focal weakness, seizures, loss of consciousness and weakness.  Endo/Heme/Allergies: Bruises/bleeds easily.  Psychiatric/Behavioral: Positive for depression. Negative for substance abuse. The patient is nervous/anxious. The patient does not have insomnia.      Objective  Filed Vitals:   02/15/15 1418  BP: 118/72  Pulse: 86  Temp: 98.7 F (37.1 C)  Resp: 18  Height: 5' 4"  (1.626 m)  Weight: 226 lb 4 oz (102.626 kg)  SpO2: 94%     Physical Exam  Constitutional: She is oriented to person, place, and time.  Obese and chronically ill-appearing  HENT:  Head: Normocephalic.  Eyes: EOM are normal. Pupils are equal, round, and reactive to light.  Neck: Normal range of motion. No thyromegaly present.  Cardiovascular: Normal rate, regular rhythm and normal heart sounds.   No murmur heard. Pulmonary/Chest: Effort normal and breath sounds normal.  Abdominal: Soft. Bowel sounds are normal.  Musculoskeletal: She exhibits no edema.  Diffuse joint and muscle tenderness. There is mild synovitis in the MCP joints  Neurological: She is alert and oriented to person, place, and time. No cranial nerve deficit. Gait normal.  Skin: Skin is warm and dry. No rash noted.  Psychiatric: Memory and affect normal.      Assessment & Plan  1. Easy bruising Probable senile purpura  2. Need for influenza vaccination Given - Flu vaccine HIGH DOSE PF (Fluzone High dose)  3. Supraventricular tachycardia (HCC) Stable  4. Essential hypertension Well-controlled  5. Angina pectoris ([place]) Well-controlled  6. Asthma, moderate persistent, uncomplicated Recent exacerbation  7. IBS (irritable bowel syndrome) Stable  8. Gastroesophageal reflux disease with esophagitis Stable  9. Fibromyalgia Exacerbated by weather  10. Primary osteoarthritis involving multiple joints Worsening PAIN collagen vascular studies  11. Restless leg Stable  12. Chronic  pain Continue current meds - oxyCODONE-acetaminophen (PERCOCET) 7.5-325 MG tablet; Take 1 tablet by mouth every 8 (eight) hours as needed for severe pain.  Dispense: 90 tablet; Refill: 0 - oxyCODONE-acetaminophen (PERCOCET) 7.5-325 MG tablet; Take 1 tablet by mouth every 8 (eight) hours as needed for severe pain.  Dispense: 90 tablet; Refill: 0 - oxyCODONE-acetaminophen (PERCOCET) 7.5-325 MG tablet; Take 1 tablet by mouth every 8 (eight) hours as needed for severe pain.  Dispense: 90 tablet; Refill: 0  13. Anxiety Continue current meds  14. Major depressive disorder, recurrent episode, mild with melancholic features ([place]) Continue current meds  15. Other fatigue Labs to rule out organic etiology - Comprehensive Metabolic Panel (CMET) - CBC - TSH - Sed Rate (ESR)

## 2015-02-16 LAB — COMPREHENSIVE METABOLIC PANEL
ALT: 21 IU/L (ref 0–32)
AST: 17 IU/L (ref 0–40)
Albumin/Globulin Ratio: 2 (ref 1.1–2.5)
Albumin: 4.3 g/dL (ref 3.6–4.8)
Alkaline Phosphatase: 74 IU/L (ref 39–117)
BUN/Creatinine Ratio: 18 (ref 11–26)
BUN: 14 mg/dL (ref 8–27)
Bilirubin Total: 0.2 mg/dL (ref 0.0–1.2)
CO2: 27 mmol/L (ref 18–29)
Calcium: 9.8 mg/dL (ref 8.7–10.3)
Chloride: 101 mmol/L (ref 96–106)
Creatinine, Ser: 0.79 mg/dL (ref 0.57–1.00)
GFR calc Af Amer: 91 mL/min/{1.73_m2} (ref >59)
GFR calc non Af Amer: 79 mL/min/{1.73_m2} (ref >59)
Globulin, Total: 2.2 g/dL (ref 1.5–4.5)
Glucose: 75 mg/dL (ref 65–99)
Potassium: 4.7 mmol/L (ref 3.5–5.2)
Sodium: 140 mmol/L (ref 134–144)
Total Protein: 6.5 g/dL (ref 6.0–8.5)

## 2015-02-16 LAB — CBC
Hematocrit: 38.9 % (ref 34.0–46.6)
Hemoglobin: 12.9 g/dL (ref 11.1–15.9)
MCH: 27.8 pg (ref 26.6–33.0)
MCHC: 33.2 g/dL (ref 31.5–35.7)
MCV: 84 fL (ref 79–97)
Platelets: 300 10*3/uL (ref 150–379)
RBC: 4.64 x10E6/uL (ref 3.77–5.28)
RDW: 16.4 % — ABNORMAL HIGH (ref 12.3–15.4)
WBC: 7.1 10*3/uL (ref 3.4–10.8)

## 2015-02-16 LAB — TSH: TSH: 0.483 u[IU]/mL (ref 0.450–4.500)

## 2015-02-16 LAB — SEDIMENTATION RATE: Sed Rate: 7 mm/hr (ref 0–40)

## 2015-02-23 DIAGNOSIS — M7989 Other specified soft tissue disorders: Secondary | ICD-10-CM | POA: Diagnosis not present

## 2015-02-23 DIAGNOSIS — I872 Venous insufficiency (chronic) (peripheral): Secondary | ICD-10-CM | POA: Diagnosis not present

## 2015-02-23 DIAGNOSIS — I8311 Varicose veins of right lower extremity with inflammation: Secondary | ICD-10-CM | POA: Diagnosis not present

## 2015-02-23 DIAGNOSIS — I8310 Varicose veins of unspecified lower extremity with inflammation: Secondary | ICD-10-CM | POA: Diagnosis not present

## 2015-02-23 DIAGNOSIS — M79609 Pain in unspecified limb: Secondary | ICD-10-CM | POA: Diagnosis not present

## 2015-02-23 DIAGNOSIS — I83811 Varicose veins of right lower extremities with pain: Secondary | ICD-10-CM | POA: Diagnosis not present

## 2015-03-04 NOTE — Pre-Procedure Instructions (Signed)
ZAVIA ROZARIO  03/04/2015      [facility] [place] [place] [facility] [address] [place] [address] Phone: [phone] Fax: [phone]  [id number] [place] [place] [address] [place] [address] Phone: [phone] Fax: [phone]    Your procedure is scheduled on Tuesday, January 17th   Report to [facility] Admitting at  9:00 AM             (Surgerical time posted for 10:57 am to 1:54 pm)   Call this number if you have problems the morning of surgery:  [phone]   Remember:  Do not eat food or drink liquids after midnight Monday.   Take these medicines the morning of surgery with A SIP OF WATER    Do not wear jewelry, make-up or nail polish.  Do not wear lotions, powders, or perfumes.  You may NOT wear deodorant the day of surgery.  Do not shave 48 hours prior to surgery.     Do not bring valuables to the hospital.  [facility] is not responsible for any belongings or valuables.  Contacts, dentures or bridgework may not be worn into surgery.  Leave your suitcase in the car.  After surgery it may be brought to your room.  For patients admitted to the hospital, discharge time will be determined by your treatment team.   Name and phone number of your driver:     Please read over the following fact sheets that you were given. Pain Booklet, Coughing and Deep Breathing, MRSA Information and Surgical Site Infection Prevention

## 2015-03-05 ENCOUNTER — Inpatient Hospital Stay (HOSPITAL_COMMUNITY)
Admission: RE | Admit: 2015-03-05 | Discharge: 2015-03-05 | Disposition: A | Discharge status: Home / Self Care | Payer: Medicare Other | Source: Ambulatory Visit

## 2015-03-12 ENCOUNTER — Encounter (HOSPITAL_COMMUNITY): Payer: Self-pay

## 2015-03-12 ENCOUNTER — Other Ambulatory Visit: Payer: Self-pay

## 2015-03-12 ENCOUNTER — Telehealth: Payer: Self-pay | Admitting: Family Medicine

## 2015-03-12 ENCOUNTER — Encounter (HOSPITAL_COMMUNITY)
Admission: RE | Admit: 2015-03-12 | Discharge: 2015-03-12 | Disposition: A | Discharge status: Home / Self Care | Payer: Medicare Other | Source: Ambulatory Visit | Attending: Orthopedic Surgery | Admitting: Orthopedic Surgery

## 2015-03-12 ENCOUNTER — Ambulatory Visit (HOSPITAL_COMMUNITY)
Admission: RE | Admit: 2015-03-12 | Discharge: 2015-03-12 | Disposition: A | Discharge status: Home / Self Care | Payer: Medicare Other | Source: Ambulatory Visit | Attending: Orthopedic Surgery | Admitting: Orthopedic Surgery

## 2015-03-12 DIAGNOSIS — E785 Hyperlipidemia, unspecified: Secondary | ICD-10-CM | POA: Insufficient documentation

## 2015-03-12 DIAGNOSIS — G2581 Restless legs syndrome: Secondary | ICD-10-CM | POA: Diagnosis not present

## 2015-03-12 DIAGNOSIS — F419 Anxiety disorder, unspecified: Secondary | ICD-10-CM | POA: Diagnosis not present

## 2015-03-12 DIAGNOSIS — I10 Essential (primary) hypertension: Secondary | ICD-10-CM | POA: Insufficient documentation

## 2015-03-12 DIAGNOSIS — Z01818 Encounter for other preprocedural examination: Secondary | ICD-10-CM | POA: Insufficient documentation

## 2015-03-12 DIAGNOSIS — F329 Major depressive disorder, single episode, unspecified: Secondary | ICD-10-CM | POA: Diagnosis not present

## 2015-03-12 DIAGNOSIS — Z01812 Encounter for preprocedural laboratory examination: Secondary | ICD-10-CM | POA: Diagnosis not present

## 2015-03-12 DIAGNOSIS — K219 Gastro-esophageal reflux disease without esophagitis: Secondary | ICD-10-CM | POA: Insufficient documentation

## 2015-03-12 DIAGNOSIS — M797 Fibromyalgia: Secondary | ICD-10-CM | POA: Diagnosis not present

## 2015-03-12 HISTORY — DX: Other nonmedicinal substance allergy status: Z91.048

## 2015-03-12 HISTORY — DX: Claustrophobia: F40.240

## 2015-03-12 HISTORY — DX: Nausea with vomiting, unspecified: R11.2

## 2015-03-12 HISTORY — DX: Personal history of urinary (tract) infections: Z87.440

## 2015-03-12 HISTORY — DX: Other specified postprocedural states: Z98.890

## 2015-03-12 HISTORY — DX: Dry eye syndrome of bilateral lacrimal glands: H04.123

## 2015-03-12 LAB — BASIC METABOLIC PANEL
Anion gap: 9 (ref 5–15)
BUN: 17 mg/dL (ref 6–20)
CO2: 26 mmol/L (ref 22–32)
Calcium: 9.5 mg/dL (ref 8.9–10.3)
Chloride: 107 mmol/L (ref 101–111)
Creatinine, Ser: 0.94 mg/dL (ref 0.44–1.00)
GFR calc Af Amer: >60 mL/min (ref >60)
GFR calc non Af Amer: >60 mL/min (ref >60)
Glucose, Bld: 109 mg/dL — ABNORMAL HIGH (ref 65–99)
Potassium: 4.3 mmol/L (ref 3.5–5.1)
Sodium: 142 mmol/L (ref 135–145)

## 2015-03-12 LAB — URINALYSIS, ROUTINE W REFLEX MICROSCOPIC
Bilirubin Urine: NEGATIVE
Glucose, UA: NEGATIVE mg/dL
Hgb urine dipstick: NEGATIVE
Ketones, ur: NEGATIVE mg/dL
Leukocytes, UA: NEGATIVE
Nitrite: NEGATIVE
Protein, ur: NEGATIVE mg/dL
Specific Gravity, Urine: 1.009 (ref 1.005–1.030)
pH: 7 (ref 5.0–8.0)

## 2015-03-12 LAB — TYPE AND SCREEN
ABO/RH(D): O POS
Antibody Screen: NEGATIVE

## 2015-03-12 LAB — CBC
HCT: 39.9 % (ref 36.0–46.0)
Hemoglobin: 12.7 g/dL (ref 12.0–15.0)
MCH: 28 pg (ref 26.0–34.0)
MCHC: 31.8 g/dL (ref 30.0–36.0)
MCV: 88.1 fL (ref 78.0–100.0)
Platelets: 255 10*3/uL (ref 150–400)
RBC: 4.53 MIL/uL (ref 3.87–5.11)
RDW: 15 % (ref 11.5–15.5)
WBC: 9.7 10*3/uL (ref 4.0–10.5)

## 2015-03-12 LAB — SURGICAL PCR SCREEN
MRSA, PCR: NEGATIVE
Staphylococcus aureus: NEGATIVE

## 2015-03-12 LAB — ABO/RH: ABO/RH(D): O POS

## 2015-03-12 NOTE — Telephone Encounter (Signed)
Pt called wanting to know the name of the Dr that Dr Rutherford Nail was going to send her to about her hand. Pt states it is ok to leave a message if need to. Pt also needs refills on Atorvastaton 40 mg tabs 90 day supply. [facility] [address]

## 2015-03-12 NOTE — Pre-Procedure Instructions (Signed)
Jacqueline Lucas  03/12/2015     Your procedure is scheduled on Tuesday, March 16, 2015.  Report to [facility] Admitting at  9:00 AM             Call this number if you have problems the morning of surgery: [phone]    Remember:  Do not eat food or drink liquids after midnight Monday.   Take these medicines the morning of surgery with A SIP OF WATER: Albuterol inhaler if needed, Clonazepam (Klonopin) if needed, Restasis eye drops, Flonase nasal spray, Gabapentin (Neurontin),  Lansoprazole (Prevacid), Xopenex nebulizer if needed, Levothyroxine (Synthroid), Dulera inhaler, Ondansetron (Zofran) if needed, Oxycodone (Percocet) if needed, Venlafaxine (Effexor XR)   Stop taking any vitamins, herbal medications, NSAIDs, Ibuprofen, Advil, Motrin, Aleve, Diclofenac (Voltaren), etc   Do not wear jewelry, make-up or nail polish.  Do not wear lotions, powders, or perfumes.  You may NOT wear deodorant.  Do not shave 48 hours prior to surgery.    Do not bring valuables to the hospital.  [facility] is not responsible for any belongings or valuables.  Contacts, dentures or bridgework may not be worn into surgery.  Leave your suitcase in the car.  After surgery it may be brought to your room.  For patients admitted to the hospital, discharge time will be determined by your treatment team.  Name and phone number of your driver:     Special Instructions: Shower using CHG soap the night before and the morning of your surgery  Please read over the following fact sheets that you were given. Pain Booklet, Coughing and Deep Breathing, MRSA Information and Surgical Site Infection Prevention

## 2015-03-14 LAB — URINE CULTURE: Culture: 5000

## 2015-03-15 ENCOUNTER — Encounter (HOSPITAL_COMMUNITY): Payer: Self-pay

## 2015-03-15 MED ORDER — CHLORHEXIDINE GLUCONATE 4 % EX LIQD: 60.0000 mL | Freq: Once | CUTANEOUS | Status: DC

## 2015-03-15 MED ORDER — CEFAZOLIN SODIUM-DEXTROSE 2-3 GM-% IV SOLR
2.0000 g | INTRAVENOUS | Status: AC
  Administered 2015-03-16: 2 g via INTRAVENOUS
  Filled 2015-03-15: qty 50

## 2015-03-15 NOTE — Progress Notes (Signed)
Anesthesia Chart Review: Patient is a 65 year old female scheduled for right knee arthroplasty, PCL sacrificing on 03/16/15/ by Dr. Marlou Sa.   History includes post-operative N/V, non-smoker, HTN, asthma, HLD, anxiety, depression, claustrophobic, fibromyalgia, angina (history of, denied at PAT), RLS, GERD, ovarian cancer, migraines, throat cancer, palpitations with PSVT s/p ablation 12/18/12, murmur (not specified), hypothyroidism, back surgery X 4, left THA '13, left TKA 07/16/13. BMI is 39 consistent with obesity. PCP is listed as Dr. Ashok Norris.   Cardiologist is Dr. Lujean Amel in 2014, record pending. Referred to EP cardiologist Dr. Wyona Almas on 12/06/12 for SVT ablation (diagnosed at [facility] 11/28/12).  Meds include albuterol, ASA 81mg , Lipitor, Klonopin, Flexeril, Restasis, Tricor, Flonase, Neurontin, Prevacid, Xopenox, levothyroxine, Mag-Ox, Dulera, Singulair, Zofran, Percocet, Diovan-HCT, Effexor-XR.   03/12/15 EKG: SR with marked sinus arrhythmia, incomplete right BBB. No significant changes since 07/02/13.  12/18/12 EP study/ablation (see Care Everywhere): Conclusions: 1. Typical AVNRT s/p slow pathway modification without ability to re-induce this tachycardia after ablation 2. Normal conduction intervals before and after ablation 3. Atrial fibrillation was induced during this study and was sustained requiring cardioversion 4. Routine post procedure care  According to cardiology notes by Dr. Marcello Moores in Care Everywhere: Echo 10/2012 EF 60% mild MR TR, LA 4.3, RV normal Exercise stress 10/2012 EF 60%no ekg or perfusion abnormalities.  03/12/15 CXR: IMPRESSION: No edema or consolidation.   Preoperative labs noted.   Last cardiology records are pending, but there are some records are available in [place] form 2014 at which time she underwent ablation. By notes, had no perfusion abnormality by stress test at that time with normal EF. She is in SR by last weeks EKG.  Based on currently available records then I would anticipate that she could proceed as planned in no acute changes.   George Hugh [facility] Short Stay Center/Anesthesiology Phone [phone] 03/15/2015 2:00 PM

## 2015-03-15 NOTE — Progress Notes (Signed)
PCP is [employer] is Dr. Clayborn Bigness in [place], [place]. LOV was last year. Patient stated she had a stress test in 2014.   Patient was seeing Dr. Raul Del at [facility] for pulmonary, however she is not seeing him anymore  Patient informed Nurse that she becomes short of breath when lying flat in bed, however if she is turned on her side she is "ok." Patient requested that she have a air mattress to sleep on while in the hospital after surgery due to fibromyalgia.  Patient stated she has asthma and last used Nebulizer two or three weeks ago. Patient stated "I had bronchitis two months straight." Patient denied having any current shortness of breath or discomfort.

## 2015-03-16 ENCOUNTER — Other Ambulatory Visit: Payer: Self-pay | Admitting: Family Medicine

## 2015-03-16 ENCOUNTER — Inpatient Hospital Stay (HOSPITAL_COMMUNITY)
Admission: RE | Admit: 2015-03-16 | Discharge: 2015-03-18 | DRG: 470 | Disposition: A | Discharge status: Home Health | Payer: Medicare Other | Source: Ambulatory Visit | Attending: Orthopedic Surgery | Admitting: Orthopedic Surgery

## 2015-03-16 ENCOUNTER — Encounter (HOSPITAL_COMMUNITY)
Admission: RE | Disposition: A | Discharge status: Home Health | Payer: Self-pay | Source: Ambulatory Visit | Attending: Orthopedic Surgery

## 2015-03-16 ENCOUNTER — Encounter (HOSPITAL_COMMUNITY): Payer: Self-pay | Admitting: Anesthesiology

## 2015-03-16 ENCOUNTER — Inpatient Hospital Stay (HOSPITAL_COMMUNITY): Payer: Medicare Other | Admitting: Vascular Surgery

## 2015-03-16 ENCOUNTER — Inpatient Hospital Stay (HOSPITAL_COMMUNITY): Payer: Medicare Other | Admitting: Anesthesiology

## 2015-03-16 DIAGNOSIS — Z85819 Personal history of malignant neoplasm of unspecified site of lip, oral cavity, and pharynx: Secondary | ICD-10-CM | POA: Diagnosis not present

## 2015-03-16 DIAGNOSIS — Z8543 Personal history of malignant neoplasm of ovary: Secondary | ICD-10-CM | POA: Diagnosis not present

## 2015-03-16 DIAGNOSIS — M1711 Unilateral primary osteoarthritis, right knee: Secondary | ICD-10-CM | POA: Diagnosis not present

## 2015-03-16 DIAGNOSIS — M25561 Pain in right knee: Secondary | ICD-10-CM | POA: Diagnosis not present

## 2015-03-16 DIAGNOSIS — I10 Essential (primary) hypertension: Secondary | ICD-10-CM | POA: Diagnosis present

## 2015-03-16 DIAGNOSIS — E785 Hyperlipidemia, unspecified: Secondary | ICD-10-CM | POA: Diagnosis present

## 2015-03-16 DIAGNOSIS — Z6838 Body mass index (BMI) 38.0-38.9, adult: Secondary | ICD-10-CM | POA: Diagnosis not present

## 2015-03-16 DIAGNOSIS — Z96652 Presence of left artificial knee joint: Secondary | ICD-10-CM | POA: Diagnosis present

## 2015-03-16 DIAGNOSIS — M179 Osteoarthritis of knee, unspecified: Secondary | ICD-10-CM | POA: Diagnosis not present

## 2015-03-16 DIAGNOSIS — K219 Gastro-esophageal reflux disease without esophagitis: Secondary | ICD-10-CM | POA: Diagnosis present

## 2015-03-16 DIAGNOSIS — J40 Bronchitis, not specified as acute or chronic: Secondary | ICD-10-CM

## 2015-03-16 DIAGNOSIS — M797 Fibromyalgia: Secondary | ICD-10-CM | POA: Diagnosis present

## 2015-03-16 DIAGNOSIS — Z96642 Presence of left artificial hip joint: Secondary | ICD-10-CM | POA: Diagnosis present

## 2015-03-16 DIAGNOSIS — G8918 Other acute postprocedural pain: Secondary | ICD-10-CM | POA: Diagnosis not present

## 2015-03-16 HISTORY — PX: TOTAL KNEE ARTHROPLASTY: SHX125

## 2015-03-16 LAB — CBC
HCT: 35.5 % — ABNORMAL LOW (ref 36.0–46.0)
Hemoglobin: 11.2 g/dL — ABNORMAL LOW (ref 12.0–15.0)
MCH: 27.3 pg (ref 26.0–34.0)
MCHC: 31.5 g/dL (ref 30.0–36.0)
MCV: 86.6 fL (ref 78.0–100.0)
Platelets: 255 10*3/uL (ref 150–400)
RBC: 4.1 MIL/uL (ref 3.87–5.11)
RDW: 15 % (ref 11.5–15.5)
WBC: 13.1 10*3/uL — ABNORMAL HIGH (ref 4.0–10.5)

## 2015-03-16 LAB — PROTIME-INR
INR: 1.14 (ref 0.00–1.49)
Prothrombin Time: 14.8 seconds (ref 11.6–15.2)

## 2015-03-16 SURGERY — ARTHROPLASTY, KNEE, TOTAL
Anesthesia: Regional | Site: Knee | Laterality: Right

## 2015-03-16 MED ORDER — PHENOL 1.4 % MT LIQD: 1.0000 | OROMUCOSAL | Status: DC | PRN

## 2015-03-16 MED ORDER — PANTOPRAZOLE SODIUM 20 MG PO TBEC
20.0000 mg | DELAYED_RELEASE_TABLET | Freq: Every day | ORAL | Status: DC
  Administered 2015-03-17: 20 mg via ORAL
  Filled 2015-03-16 (×2): qty 1

## 2015-03-16 MED ORDER — ACETAMINOPHEN 325 MG PO TABS
650.0000 mg | ORAL_TABLET | Freq: Four times a day (QID) | ORAL | Status: DC | PRN
  Administered 2015-03-17: 650 mg via ORAL
  Filled 2015-03-16: qty 2

## 2015-03-16 MED ORDER — ONDANSETRON HCL 4 MG/2ML IJ SOLN
INTRAMUSCULAR | Status: DC | PRN
Start: 2015-03-16 — End: 2015-03-16
  Administered 2015-03-16 (×2): 4 mg via INTRAVENOUS

## 2015-03-16 MED ORDER — PROPOFOL 10 MG/ML IV BOLUS
INTRAVENOUS | Status: AC
  Filled 2015-03-16: qty 20

## 2015-03-16 MED ORDER — ACETAMINOPHEN 650 MG RE SUPP: 650.0000 mg | Freq: Four times a day (QID) | RECTAL | Status: DC | PRN

## 2015-03-16 MED ORDER — MIDAZOLAM HCL 2 MG/2ML IJ SOLN
INTRAMUSCULAR | Status: AC
  Filled 2015-03-16: qty 2

## 2015-03-16 MED ORDER — VITAMIN B-12 1000 MCG PO TABS
1000.0000 ug | ORAL_TABLET | Freq: Every day | ORAL | Status: DC
  Administered 2015-03-17 – 2015-03-18 (×2): 1000 ug via ORAL
  Filled 2015-03-16 (×2): qty 1

## 2015-03-16 MED ORDER — ALBUTEROL SULFATE (5 MG/ML) 0.5% IN NEBU: 2.5000 mg | INHALATION_SOLUTION | Freq: Four times a day (QID) | RESPIRATORY_TRACT | Status: DC

## 2015-03-16 MED ORDER — HYDROMORPHONE HCL 1 MG/ML IJ SOLN
0.2500 mg | INTRAMUSCULAR | Status: DC | PRN
  Administered 2015-03-16 (×4): 0.5 mg via INTRAVENOUS

## 2015-03-16 MED ORDER — HYDROMORPHONE HCL 1 MG/ML IJ SOLN
INTRAMUSCULAR | Status: AC
  Administered 2015-03-16: 0.5 mg via INTRAVENOUS
  Filled 2015-03-16: qty 1

## 2015-03-16 MED ORDER — BUPIVACAINE LIPOSOME 1.3 % IJ SUSP
20.0000 mL | INTRAMUSCULAR | Status: DC
  Filled 2015-03-16: qty 20

## 2015-03-16 MED ORDER — 0.9 % SODIUM CHLORIDE (POUR BTL) OPTIME
TOPICAL | Status: DC | PRN
  Administered 2015-03-16 (×4): 1000 mL

## 2015-03-16 MED ORDER — OXYCODONE HCL 5 MG PO TABS
5.0000 mg | ORAL_TABLET | ORAL | Status: DC | PRN
  Administered 2015-03-16 – 2015-03-18 (×10): 10 mg via ORAL
  Filled 2015-03-16 (×8): qty 2

## 2015-03-16 MED ORDER — SODIUM CHLORIDE 0.9 % IR SOLN
Status: DC | PRN
  Administered 2015-03-16: 3000 mL

## 2015-03-16 MED ORDER — LIDOCAINE HCL (CARDIAC) 20 MG/ML IV SOLN
INTRAVENOUS | Status: DC | PRN
  Administered 2015-03-16: 40 mg via INTRAVENOUS

## 2015-03-16 MED ORDER — METHOCARBAMOL 1000 MG/10ML IJ SOLN
500.0000 mg | Freq: Once | INTRAVENOUS | Status: AC | PRN
  Administered 2015-03-16: 500 mg via INTRAVENOUS
  Filled 2015-03-16: qty 5

## 2015-03-16 MED ORDER — CLONAZEPAM 0.5 MG PO TABS
0.5000 mg | ORAL_TABLET | Freq: Two times a day (BID) | ORAL | Status: DC
  Administered 2015-03-16 – 2015-03-18 (×4): 0.5 mg via ORAL
  Filled 2015-03-16 (×4): qty 1

## 2015-03-16 MED ORDER — MONTELUKAST SODIUM 10 MG PO TABS
10.0000 mg | ORAL_TABLET | Freq: Every day | ORAL | Status: DC
  Administered 2015-03-16 – 2015-03-17 (×2): 10 mg via ORAL
  Filled 2015-03-16 (×2): qty 1

## 2015-03-16 MED ORDER — CYCLOBENZAPRINE HCL 10 MG PO TABS
10.0000 mg | ORAL_TABLET | Freq: Three times a day (TID) | ORAL | Status: DC | PRN
  Administered 2015-03-16 – 2015-03-18 (×3): 10 mg via ORAL
  Filled 2015-03-16 (×3): qty 1

## 2015-03-16 MED ORDER — VITAMIN D 1000 UNITS PO TABS
1000.0000 [IU] | ORAL_TABLET | Freq: Every day | ORAL | Status: DC
  Administered 2015-03-17 – 2015-03-18 (×2): 1000 [IU] via ORAL
  Filled 2015-03-16 (×2): qty 1

## 2015-03-16 MED ORDER — VALSARTAN-HYDROCHLOROTHIAZIDE 160-12.5 MG PO TABS: 1.0000 | ORAL_TABLET | Freq: Every day | ORAL | Status: DC

## 2015-03-16 MED ORDER — LEVOTHYROXINE SODIUM 88 MCG PO TABS
88.0000 ug | ORAL_TABLET | Freq: Every day | ORAL | Status: DC
  Administered 2015-03-17 – 2015-03-18 (×2): 88 ug via ORAL
  Filled 2015-03-16 (×2): qty 1

## 2015-03-16 MED ORDER — BUPIVACAINE LIPOSOME 1.3 % IJ SUSP
INTRAMUSCULAR | Status: DC | PRN
  Administered 2015-03-16: 20 mL

## 2015-03-16 MED ORDER — BUPIVACAINE-EPINEPHRINE (PF) 0.25% -1:200000 IJ SOLN
INTRAMUSCULAR | Status: DC | PRN
  Administered 2015-03-16: 20 mL via PERINEURAL

## 2015-03-16 MED ORDER — ROPIVACAINE HCL 5 MG/ML IJ SOLN
INTRAMUSCULAR | Status: DC | PRN
Start: 2015-03-16 — End: 2015-03-16
  Administered 2015-03-16: 30 mL via PERINEURAL

## 2015-03-16 MED ORDER — MAGNESIUM OXIDE 400 MG PO TABS: 400.0000 mg | ORAL_TABLET | Freq: Every day | ORAL | Status: DC

## 2015-03-16 MED ORDER — DIPHENHYDRAMINE HCL 50 MG/ML IJ SOLN
INTRAMUSCULAR | Status: DC | PRN
  Administered 2015-03-16: 12.5 mg via INTRAVENOUS

## 2015-03-16 MED ORDER — ONDANSETRON HCL 4 MG/2ML IJ SOLN
INTRAMUSCULAR | Status: AC
  Filled 2015-03-16: qty 2

## 2015-03-16 MED ORDER — FENOFIBRATE 145 MG PO TABS
145.0000 mg | ORAL_TABLET | Freq: Every day | ORAL | Status: DC
  Administered 2015-03-17: 145 mg via ORAL
  Filled 2015-03-16 (×2): qty 1

## 2015-03-16 MED ORDER — ATORVASTATIN CALCIUM 40 MG PO TABS
40.0000 mg | ORAL_TABLET | Freq: Every day | ORAL | Status: DC
  Administered 2015-03-17: 40 mg via ORAL
  Filled 2015-03-16: qty 1

## 2015-03-16 MED ORDER — WARFARIN - PHARMACIST DOSING INPATIENT: Freq: Every day | Status: DC

## 2015-03-16 MED ORDER — ONDANSETRON HCL 4 MG/2ML IJ SOLN
4.0000 mg | Freq: Four times a day (QID) | INTRAMUSCULAR | Status: DC | PRN
  Administered 2015-03-17: 4 mg via INTRAVENOUS
  Filled 2015-03-16: qty 2

## 2015-03-16 MED ORDER — WARFARIN SODIUM 5 MG PO TABS
5.0000 mg | ORAL_TABLET | ORAL | Status: AC
  Administered 2015-03-16: 5 mg via ORAL
  Filled 2015-03-16: qty 1

## 2015-03-16 MED ORDER — HYDROCHLOROTHIAZIDE 12.5 MG PO CAPS
12.5000 mg | ORAL_CAPSULE | Freq: Every day | ORAL | Status: DC
  Administered 2015-03-17 – 2015-03-18 (×2): 12.5 mg via ORAL
  Filled 2015-03-16 (×2): qty 1

## 2015-03-16 MED ORDER — PROPOFOL 10 MG/ML IV BOLUS
INTRAVENOUS | Status: DC | PRN
  Administered 2015-03-16: 200 mg via INTRAVENOUS

## 2015-03-16 MED ORDER — VENLAFAXINE HCL ER 75 MG PO CP24
75.0000 mg | ORAL_CAPSULE | Freq: Every day | ORAL | Status: DC
  Administered 2015-03-17 – 2015-03-18 (×2): 75 mg via ORAL
  Filled 2015-03-16 (×2): qty 1

## 2015-03-16 MED ORDER — HYDROMORPHONE HCL 1 MG/ML IJ SOLN
1.0000 mg | INTRAMUSCULAR | Status: DC | PRN
  Administered 2015-03-16 – 2015-03-18 (×7): 1 mg via INTRAVENOUS
  Filled 2015-03-16 (×7): qty 1

## 2015-03-16 MED ORDER — FENTANYL CITRATE (PF) 250 MCG/5ML IJ SOLN
INTRAMUSCULAR | Status: AC
  Filled 2015-03-16: qty 5

## 2015-03-16 MED ORDER — FENTANYL CITRATE (PF) 100 MCG/2ML IJ SOLN
75.0000 ug | Freq: Once | INTRAMUSCULAR | Status: AC
  Administered 2015-03-16: 75 ug via INTRAVENOUS
  Filled 2015-03-16: qty 1.5

## 2015-03-16 MED ORDER — OXYCODONE HCL 5 MG PO TABS
ORAL_TABLET | ORAL | Status: AC
  Filled 2015-03-16: qty 2

## 2015-03-16 MED ORDER — BACLOFEN 10 MG PO TABS
10.0000 mg | ORAL_TABLET | Freq: Two times a day (BID) | ORAL | Status: DC | PRN
  Administered 2015-03-17 – 2015-03-18 (×2): 10 mg via ORAL
  Filled 2015-03-16 (×2): qty 1

## 2015-03-16 MED ORDER — FENTANYL CITRATE (PF) 100 MCG/2ML IJ SOLN
INTRAMUSCULAR | Status: AC
  Administered 2015-03-16 (×7): 25 ug via INTRAVENOUS
  Filled 2015-03-16: qty 2

## 2015-03-16 MED ORDER — OXYCODONE HCL 5 MG PO TABS: 5.0000 mg | ORAL_TABLET | Freq: Once | ORAL | Status: DC | PRN

## 2015-03-16 MED ORDER — TRANEXAMIC ACID 1000 MG/10ML IV SOLN
2000.0000 mg | INTRAVENOUS | Status: DC | PRN
  Administered 2015-03-16: 2000 mg via TOPICAL

## 2015-03-16 MED ORDER — BUPIVACAINE-EPINEPHRINE (PF) 0.25% -1:200000 IJ SOLN
INTRAMUSCULAR | Status: AC
  Filled 2015-03-16: qty 30

## 2015-03-16 MED ORDER — GABAPENTIN 300 MG PO CAPS
900.0000 mg | ORAL_CAPSULE | Freq: Three times a day (TID) | ORAL | Status: DC
  Administered 2015-03-16 – 2015-03-18 (×5): 900 mg via ORAL
  Filled 2015-03-16 (×5): qty 3

## 2015-03-16 MED ORDER — HYDROMORPHONE HCL 1 MG/ML IJ SOLN
INTRAMUSCULAR | Status: AC
  Filled 2015-03-16: qty 1

## 2015-03-16 MED ORDER — PHENYLEPHRINE HCL 10 MG/ML IJ SOLN
INTRAMUSCULAR | Status: DC | PRN
  Administered 2015-03-16: 80 ug via INTRAVENOUS
  Administered 2015-03-16 (×3): 40 ug via INTRAVENOUS
  Administered 2015-03-16: 80 ug via INTRAVENOUS
  Administered 2015-03-16: 40 ug via INTRAVENOUS
  Administered 2015-03-16 (×2): 80 ug via INTRAVENOUS

## 2015-03-16 MED ORDER — POTASSIUM CHLORIDE IN NACL 20-0.9 MEQ/L-% IV SOLN
INTRAVENOUS | Status: AC
  Administered 2015-03-16: 20:00:00 via INTRAVENOUS
  Filled 2015-03-16 (×2): qty 1000

## 2015-03-16 MED ORDER — MENTHOL 3 MG MT LOZG: 1.0000 | LOZENGE | OROMUCOSAL | Status: DC | PRN

## 2015-03-16 MED ORDER — METOCLOPRAMIDE HCL 5 MG/ML IJ SOLN: 5.0000 mg | Freq: Three times a day (TID) | INTRAMUSCULAR | Status: DC | PRN

## 2015-03-16 MED ORDER — MIDAZOLAM HCL 5 MG/5ML IJ SOLN
INTRAMUSCULAR | Status: DC | PRN
  Administered 2015-03-16: 2 mg via INTRAVENOUS

## 2015-03-16 MED ORDER — LACTATED RINGERS IV SOLN
INTRAVENOUS | Status: DC
  Administered 2015-03-16 (×3): via INTRAVENOUS

## 2015-03-16 MED ORDER — CHOLECALCIFEROL 25 MCG (1000 UT) PO TABS: 1000.0000 [IU] | ORAL_TABLET | Freq: Every day | ORAL | Status: DC

## 2015-03-16 MED ORDER — METOCLOPRAMIDE HCL 5 MG PO TABS: 5.0000 mg | ORAL_TABLET | Freq: Three times a day (TID) | ORAL | Status: DC | PRN

## 2015-03-16 MED ORDER — IRBESARTAN 150 MG PO TABS
150.0000 mg | ORAL_TABLET | Freq: Every day | ORAL | Status: DC
  Administered 2015-03-17 – 2015-03-18 (×2): 150 mg via ORAL
  Filled 2015-03-16 (×2): qty 1

## 2015-03-16 MED ORDER — ONDANSETRON HCL 4 MG PO TABS: 4.0000 mg | ORAL_TABLET | Freq: Four times a day (QID) | ORAL | Status: DC | PRN

## 2015-03-16 MED ORDER — PROMETHAZINE HCL 25 MG/ML IJ SOLN: 6.2500 mg | INTRAMUSCULAR | Status: DC | PRN

## 2015-03-16 MED ORDER — FLUTICASONE PROPIONATE 50 MCG/ACT NA SUSP
1.0000 | Freq: Every day | NASAL | Status: DC | PRN
  Filled 2015-03-16: qty 16

## 2015-03-16 MED ORDER — EPHEDRINE SULFATE 50 MG/ML IJ SOLN
INTRAMUSCULAR | Status: DC | PRN
Start: 2015-03-16 — End: 2015-03-16
  Administered 2015-03-16: 5 mg via INTRAVENOUS

## 2015-03-16 MED ORDER — ALBUTEROL SULFATE (2.5 MG/3ML) 0.083% IN NEBU: 2.5000 mg | INHALATION_SOLUTION | Freq: Once | RESPIRATORY_TRACT | Status: DC

## 2015-03-16 MED ORDER — LIDOCAINE HCL (CARDIAC) 20 MG/ML IV SOLN
INTRAVENOUS | Status: AC
  Filled 2015-03-16: qty 5

## 2015-03-16 MED ORDER — TRANEXAMIC ACID 1000 MG/10ML IV SOLN
2000.0000 mg | INTRAVENOUS | Status: DC
  Filled 2015-03-16: qty 20

## 2015-03-16 MED ORDER — BUPIVACAINE-EPINEPHRINE 0.5% -1:200000 IJ SOLN
INTRAMUSCULAR | Status: DC | PRN
  Administered 2015-03-16: 10 mL

## 2015-03-16 MED ORDER — ALBUTEROL SULFATE HFA 108 (90 BASE) MCG/ACT IN AERS: 2.0000 | INHALATION_SPRAY | Freq: Four times a day (QID) | RESPIRATORY_TRACT | Status: DC | PRN

## 2015-03-16 MED ORDER — MAGNESIUM OXIDE 400 (241.3 MG) MG PO TABS
400.0000 mg | ORAL_TABLET | Freq: Every day | ORAL | Status: DC
  Administered 2015-03-17 – 2015-03-18 (×2): 400 mg via ORAL
  Filled 2015-03-16 (×2): qty 1

## 2015-03-16 MED ORDER — CEFAZOLIN SODIUM-DEXTROSE 2-3 GM-% IV SOLR
2.0000 g | Freq: Four times a day (QID) | INTRAVENOUS | Status: AC
  Administered 2015-03-16 – 2015-03-17 (×2): 2 g via INTRAVENOUS
  Filled 2015-03-16 (×2): qty 50

## 2015-03-16 MED ORDER — ALBUTEROL SULFATE (2.5 MG/3ML) 0.083% IN NEBU: 2.5000 mg | INHALATION_SOLUTION | Freq: Four times a day (QID) | RESPIRATORY_TRACT | Status: DC | PRN

## 2015-03-16 MED ORDER — DEXAMETHASONE SODIUM PHOSPHATE 10 MG/ML IJ SOLN
INTRAMUSCULAR | Status: DC | PRN
  Administered 2015-03-16: 8 mg via INTRAVENOUS

## 2015-03-16 MED ORDER — ONDANSETRON HCL 4 MG PO TABS: 4.0000 mg | ORAL_TABLET | Freq: Three times a day (TID) | ORAL | Status: DC | PRN

## 2015-03-16 MED ORDER — MOMETASONE FURO-FORMOTEROL FUM 200-5 MCG/ACT IN AERO
2.0000 | INHALATION_SPRAY | Freq: Every day | RESPIRATORY_TRACT | Status: DC
  Administered 2015-03-17 – 2015-03-18 (×2): 2 via RESPIRATORY_TRACT
  Filled 2015-03-16: qty 8.8

## 2015-03-16 MED ORDER — OXYCODONE HCL 5 MG/5ML PO SOLN: 5.0000 mg | Freq: Once | ORAL | Status: DC | PRN

## 2015-03-16 MED ORDER — DEXAMETHASONE SODIUM PHOSPHATE 4 MG/ML IJ SOLN
INTRAMUSCULAR | Status: AC
  Filled 2015-03-16: qty 2

## 2015-03-16 MED ORDER — CLONIDINE HCL (ANALGESIA) 100 MCG/ML EP SOLN
EPIDURAL | Status: DC | PRN
  Administered 2015-03-16: 1 mL

## 2015-03-16 MED ORDER — ALBUTEROL SULFATE (2.5 MG/3ML) 0.083% IN NEBU
2.5000 mg | INHALATION_SOLUTION | Freq: Four times a day (QID) | RESPIRATORY_TRACT | Status: DC
  Administered 2015-03-16: 2.5 mg via RESPIRATORY_TRACT
  Filled 2015-03-16 (×2): qty 3

## 2015-03-16 MED ORDER — BUPIVACAINE-EPINEPHRINE (PF) 0.5% -1:200000 IJ SOLN
INTRAMUSCULAR | Status: AC
  Filled 2015-03-16: qty 30

## 2015-03-16 SURGICAL SUPPLY — 83 items
BANDAGE ELASTIC 4 VELCRO ST LF (GAUZE/BANDAGES/DRESSINGS) ×3 IMPLANT
BANDAGE ESMARK 6X9 LF (GAUZE/BANDAGES/DRESSINGS) ×1 IMPLANT
BLADE SAG 18X100X1.27 (BLADE) ×3 IMPLANT
BLADE SAW SGTL 13.0X1.19X90.0M (BLADE) IMPLANT
BNDG CMPR 9X6 STRL LF SNTH (GAUZE/BANDAGES/DRESSINGS) ×1
BNDG CMPR MED 10X6 ELC LF (GAUZE/BANDAGES/DRESSINGS) ×1
BNDG COHESIVE 6X5 TAN STRL LF (GAUZE/BANDAGES/DRESSINGS) ×3 IMPLANT
BNDG ELASTIC 6X10 VLCR STRL LF (GAUZE/BANDAGES/DRESSINGS) ×5 IMPLANT
BNDG ESMARK 6X9 LF (GAUZE/BANDAGES/DRESSINGS) ×3
BOWL SMART MIX CTS (DISPOSABLE) IMPLANT
CAPT KNEE TOTAL 3 ×2 IMPLANT
CEMENT BONE SIMPLEX SPEEDSET (Cement) ×4 IMPLANT
CLOSURE WOUND 1/2 X4 (GAUZE/BANDAGES/DRESSINGS) ×2
COVER SURGICAL LIGHT HANDLE (MISCELLANEOUS) ×3 IMPLANT
CUFF TOURNIQUET SINGLE 34IN LL (TOURNIQUET CUFF) ×3 IMPLANT
CUFF TOURNIQUET SINGLE 44IN (TOURNIQUET CUFF) IMPLANT
DECANTER SPIKE VIAL GLASS SM (MISCELLANEOUS) ×3 IMPLANT
DRAPE INCISE IOBAN 66X45 STRL (DRAPES) IMPLANT
DRAPE ORTHO SPLIT 77X108 STRL (DRAPES) ×9
DRAPE SURG ORHT 6 SPLT 77X108 (DRAPES) ×3 IMPLANT
DRAPE U-SHAPE 47X51 STRL (DRAPES) ×3 IMPLANT
DRSG MEPILEX BORDER 4X12 (GAUZE/BANDAGES/DRESSINGS) ×2 IMPLANT
DRSG MEPILEX BORDER 4X8 (GAUZE/BANDAGES/DRESSINGS) ×3 IMPLANT
DRSG PAD ABDOMINAL 8X10 ST (GAUZE/BANDAGES/DRESSINGS) ×6 IMPLANT
DURAPREP 26ML APPLICATOR (WOUND CARE) ×3 IMPLANT
ELECT REM PT RETURN 9FT ADLT (ELECTROSURGICAL) ×3
ELECTRODE REM PT RTRN 9FT ADLT (ELECTROSURGICAL) ×1 IMPLANT
FACESHIELD WRAPAROUND (MASK) ×3 IMPLANT
FACESHIELD WRAPAROUND OR TEAM (MASK) ×1 IMPLANT
GAUZE SPONGE 4X4 12PLY STRL (GAUZE/BANDAGES/DRESSINGS) ×3 IMPLANT
GAUZE XEROFORM 1X8 LF (GAUZE/BANDAGES/DRESSINGS) ×3 IMPLANT
GAUZE XEROFORM 5X9 LF (GAUZE/BANDAGES/DRESSINGS) ×3 IMPLANT
GLOVE BIOGEL PI IND STRL 7.5 (GLOVE) ×1 IMPLANT
GLOVE BIOGEL PI IND STRL 8 (GLOVE) ×2 IMPLANT
GLOVE BIOGEL PI INDICATOR 7.5 (GLOVE) ×2
GLOVE BIOGEL PI INDICATOR 8 (GLOVE) ×4
GLOVE ECLIPSE 7.0 STRL STRAW (GLOVE) ×6 IMPLANT
GLOVE SURG ORTHO 8.0 STRL STRW (GLOVE) ×6 IMPLANT
GOWN STRL REUS W/ TWL LRG LVL3 (GOWN DISPOSABLE) ×3 IMPLANT
GOWN STRL REUS W/TWL 2XL LVL3 (GOWN DISPOSABLE) ×3 IMPLANT
GOWN STRL REUS W/TWL LRG LVL3 (GOWN DISPOSABLE) ×9
HANDPIECE INTERPULSE COAX TIP (DISPOSABLE)
HOOD PEEL AWAY FACE SHEILD DIS (HOOD) ×6 IMPLANT
IMMOBILIZER KNEE 20 (SOFTGOODS) IMPLANT
IMMOBILIZER KNEE 22 UNIV (SOFTGOODS) ×3 IMPLANT
IMMOBILIZER KNEE 24 THIGH 36 (MISCELLANEOUS) IMPLANT
IMMOBILIZER KNEE 24 UNIV (MISCELLANEOUS)
KIT BASIN OR (CUSTOM PROCEDURE TRAY) ×3 IMPLANT
KIT ROOM TURNOVER OR (KITS) ×3 IMPLANT
MANIFOLD NEPTUNE II (INSTRUMENTS) ×3 IMPLANT
NDL 18GX1X1/2 (RX/OR ONLY) (NEEDLE) ×1 IMPLANT
NDL SAFETY ECLIPSE 18X1.5 (NEEDLE) ×1 IMPLANT
NDL SPNL 18GX3.5 QUINCKE PK (NEEDLE) ×1 IMPLANT
NEEDLE 18GX1X1/2 (RX/OR ONLY) (NEEDLE) ×3 IMPLANT
NEEDLE HYPO 18GX1.5 SHARP (NEEDLE) ×3
NEEDLE HYPO 22GX1.5 SAFETY (NEEDLE) ×6 IMPLANT
NEEDLE SPNL 18GX3.5 QUINCKE PK (NEEDLE) ×3 IMPLANT
NS IRRIG 1000ML POUR BTL (IV SOLUTION) ×6 IMPLANT
PACK TOTAL JOINT (CUSTOM PROCEDURE TRAY) ×3 IMPLANT
PACK UNIVERSAL I (CUSTOM PROCEDURE TRAY) ×3 IMPLANT
PAD ARMBOARD 7.5X6 YLW CONV (MISCELLANEOUS) ×6 IMPLANT
PAD CAST 4YDX4 CTTN HI CHSV (CAST SUPPLIES) ×1 IMPLANT
PADDING CAST COTTON 4X4 STRL (CAST SUPPLIES) ×3
PADDING CAST COTTON 6X4 STRL (CAST SUPPLIES) ×5 IMPLANT
SET HNDPC FAN SPRY TIP SCT (DISPOSABLE) IMPLANT
SPONGE GAUZE 4X4 12PLY STER LF (GAUZE/BANDAGES/DRESSINGS) ×2 IMPLANT
SPONGE LAP 18X18 X RAY DECT (DISPOSABLE) IMPLANT
STEM CEMENTED TRIATHLON (Stem) ×2 IMPLANT
STRIP CLOSURE SKIN 1/2X4 (GAUZE/BANDAGES/DRESSINGS) ×4 IMPLANT
SUCTION FRAZIER TIP 10 FR DISP (SUCTIONS) ×3 IMPLANT
SUT ETHILON 3 0 PS 1 (SUTURE) ×2 IMPLANT
SUT MNCRL AB 3-0 PS2 18 (SUTURE) ×3 IMPLANT
SUT VIC AB 0 CT1 27 (SUTURE) ×15
SUT VIC AB 0 CT1 27XBRD ANBCTR (SUTURE) ×3 IMPLANT
SUT VIC AB 1 CT1 27 (SUTURE) ×15
SUT VIC AB 1 CT1 27XBRD ANBCTR (SUTURE) ×5 IMPLANT
SUT VIC AB 2-0 CT1 27 (SUTURE) ×9
SUT VIC AB 2-0 CT1 TAPERPNT 27 (SUTURE) ×2 IMPLANT
SYR 30ML LL (SYRINGE) ×9 IMPLANT
SYR TB 1ML LUER SLIP (SYRINGE) ×3 IMPLANT
TOWEL OR 17X24 6PK STRL BLUE (TOWEL DISPOSABLE) ×6 IMPLANT
TOWEL OR 17X26 10 PK STRL BLUE (TOWEL DISPOSABLE) ×6 IMPLANT
WATER STERILE IRR 1000ML POUR (IV SOLUTION) ×6 IMPLANT

## 2015-03-16 NOTE — Anesthesia Preprocedure Evaluation (Addendum)
Anesthesia Evaluation  Patient identified by MRN, date of birth, ID band Patient awake    Reviewed: Allergy & Precautions, H&P , NPO status , Patient's Chart, lab work & pertinent test results  History of Anesthesia Complications (+) PONV and history of anesthetic complications  Airway Mallampati: II  TM Distance: >3 FB Neck ROM: full    Dental no notable dental hx.    Pulmonary asthma ,    Pulmonary exam normal breath sounds clear to auscultation       Cardiovascular hypertension, Pt. on medications Normal cardiovascular exam Rhythm:regular Rate:Normal     Neuro/Psych  Headaches, Anxiety Depression  Neuromuscular disease    GI/Hepatic Neg liver ROS, GERD  ,  Endo/Other  Morbid obesity  Renal/GU negative Renal ROS     Musculoskeletal  (+) Arthritis , Fibromyalgia -  Abdominal   Peds  Hematology negative hematology ROS (+)   Anesthesia Other Findings   Reproductive/Obstetrics negative OB ROS                            Anesthesia Physical Anesthesia Plan  ASA: III  Anesthesia Plan: General and Regional   Post-op Pain Management: GA combined w/ Regional for post-op pain   Induction: Intravenous  Airway Management Planned: LMA  Additional Equipment:   Intra-op Plan:   Post-operative Plan: Extubation in OR  Informed Consent: I have reviewed the patients History and Physical, chart, labs and discussed the procedure including the risks, benefits and alternatives for the proposed anesthesia with the patient or authorized representative who has indicated his/her understanding and acceptance.   Dental Advisory Given  Plan Discussed with: Anesthesiologist, CRNA and Surgeon  Anesthesia Plan Comments: (Will avoid spinal anesthetic given significant lumbar instrumentation from >3 back surgeries after discussing risks of infection, difficulty, variability to medication with patient. )        Anesthesia Quick Evaluation

## 2015-03-16 NOTE — Progress Notes (Signed)
Orthopedic Tech Progress Note Patient Details:  Jacqueline Lucas 20-Nov-1950 [id number]  CPM Right Knee CPM Right Knee: On Right Knee Flexion (Degrees): 40 Right Knee Extension (Degrees): 0 Bed will not accept ohf; RN notified  Hildred Priest 03/16/2015, 3:18 PM

## 2015-03-16 NOTE — Progress Notes (Signed)
Utilization review completed.  

## 2015-03-16 NOTE — Brief Op Note (Signed)
03/16/2015  1:47 PM  PATIENT:  Jacqueline Lucas  65 y.o. female  PRE-OPERATIVE DIAGNOSIS:  RIGHT KNEE OSTEOARTHRITIS  POST-OPERATIVE DIAGNOSIS:  RIGHT KNEE OSTEOARTHRITIS  PROCEDURE:  Procedure(s): RIGHT TOTAL KNEE ARTHROPLASTY, PCL SACRIFICING  SURGEON:  Surgeon(s): Meredith Pel, MD  ASSISTANT: Laure Kidney RNFA  ANESTHESIA:   general  EBL: 100 ml    Total I/O In: 1500 [I.V.:1500] Out: 50 [Blood:50]  BLOOD ADMINISTERED: none  DRAINS: none   LOCAL MEDICATIONS USED:  exparel marcaine clonidine  SPECIMEN:  No Specimen  COUNTS:  YES  TOURNIQUET:   Total Tourniquet Time Documented: Thigh (Right) - 3 minutes Thigh (Right) - 86 minutes Total: Thigh (Right) - 89 minutes   DICTATION: .Other Dictation: Dictation Number dictated  PLAN OF CARE: Admit to inpatient   PATIENT DISPOSITION:  PACU - hemodynamically stable

## 2015-03-16 NOTE — Op Note (Signed)
NAMEMarland Kitchen  ENAJAH, FARIN NO.:  [id number]  MEDICAL RECORD NO.:  [id number]  LOCATION:  [id number]                        FACILITY:  [facility]  PHYSICIAN:  [place], M.D.    DATE OF BIRTH:  Oct 28, 1950  DATE OF PROCEDURE: DATE OF DISCHARGE:                              OPERATIVE REPORT   PREOPERATIVE DIAGNOSIS:  Right knee arthritis.  POSTOPERATIVE DIAGNOSIS:  Right knee arthritis.  PROCEDURE:  Right total knee replacement, Triathlon posterior cruciate sacrificing cemented 3 femur, 4 tibia, 11-mm polyethylene insert, 3 pegged 32-mm cemented patella.  SURGEON:  [place], M.D.  ASSISTANT:  Laure Kidney, RNFA.  INDICATIONS:  Emileah is a patient with right knee arthritis refractory to nonoperative management, done well with her left total knee replacement, presents now for right total knee replacement after explanation of risks and benefits.  PROCEDURE IN DETAIL:  The patient was brought to the operating room where general endotracheal anesthetic was induced.  Preoperative antibiotics were administered.  Time-out was called.  Right leg was prescrubbed with alcohol and Betadine and allowed to air dry, prepped with DuraPrep solution and draped in a sterile manner.  Charlie Pitter was used to cover the operative field.  Time-out called.  Leg elevated and exsanguinated with the Esmarch wrap.  Tourniquet was inflated.  Anterior approach to the knee was made.  Skin and subcutaneous tissue were sharply divided.  Median parapatellar approach was made, marked with a #1 Vicryl suture.  Patella elevated.  Lateral patellofemoral ligament was released.  Fat pad partially excised.  Soft tissue elevated off the anterior and distal femur.  Medial soft tissue dissection performed about 1-2 cm below the medial joint line back to the semimembranous bursa.  At this time, intramedullary alignment was utilized to make a cut perpendicular to mechanical axis, 9 mm off the least affected lateral  tibial plateau with the collaterals and posterior neurovascular structures protected.  Following this, 8-mm cut made off the distal femur, 5 degrees valgus and about 3 degrees of external rotation parallel to the epicondylar axis.  At this time, the femur was sized to size 3.  Box and chamfer cuts were made with the collaterals protected. Tibia was then keel punched to accept a 50-mm stem.  The patient's weight is approximately 100 kg.  At this time, trial femur, tibia and 11- mm spacer were placed.  The patient had good stability, varus and valgus stress at 0 and 30 degrees.  Patella was then cut from 24-14 and 3 pegged patellar trial was placed.  The patient had good tracking using no thumbs technique.  Full range of motion, flexion and extension without lift-off.  At this time, trial components were removed. Thorough irrigation of 3 liters irrigating solution performed.  Exparel was then injected.  Tranexamic acid then allowed to set the incision for 3 minutes.  The knee was then irrigated and excess fluid removed from the bony surfaces.  The knee was then cemented into position with excess cement removed.  At this time, same stability parameters were maintained, 11-mm spacer was placed, which gave full extension, full flexion and excellent stability.  Tourniquet released at this time. Bleeding points were encountered and controlled using electrocautery.  Thorough irrigation again performed and knee then closed over bolster using #1 Vicryl suture, 0 Vicryl suture, 2-0 Vicryl suture and running 3-0 Monocryl.  Mepilex dressing, bulky dressing and knee immobilizer placed.  The patient tolerated the procedure well without immediate complication, transferred to the recovery room in stable condition.     [place], M.D.     GSD/MEDQ  D:  03/16/2015  T:  03/16/2015  Job:  [id number]

## 2015-03-16 NOTE — Anesthesia Postprocedure Evaluation (Signed)
Anesthesia Post Note  Patient: [name]  Procedure(s) Performed: Procedure(s) (LRB): RIGHT TOTAL KNEE ARTHROPLASTY, PCL SACRIFICING (Right)  Patient location during evaluation: PACU Anesthesia Type: General and Regional Level of consciousness: awake and alert Pain management: pain level controlled Vital Signs Assessment: post-procedure vital signs reviewed and stable Respiratory status: spontaneous breathing, nonlabored ventilation, respiratory function stable and patient connected to nasal cannula oxygen Cardiovascular status: blood pressure returned to baseline and stable Postop Assessment: no signs of nausea or vomiting Anesthetic complications: no    Last Vitals:  Filed Vitals:   03/16/15 1000 03/16/15 1357  BP: 142/62 117/59  Pulse: 70 100  Temp:  36.5 C  Resp: 20 21    Last Pain:  Filed Vitals:   03/16/15 1417  PainSc: 10-Worst pain ever                 Zenaida Deed

## 2015-03-16 NOTE — Anesthesia Procedure Notes (Signed)
Anesthesia Regional Block:  Adductor canal block  Pre-Anesthetic Checklist: ,, timeout performed, Correct Patient, Correct Site, Correct Laterality, Correct Procedure, Correct Position, site marked, Risks and benefits discussed,  Surgical consent,  Pre-op evaluation,  At surgeon's request and post-op pain management  Laterality: Right  Prep: chloraprep       Needles:  Injection technique: Single-shot  Needle Type: Echogenic Stimulator Needle     Needle Length: 9cm 9 cm Needle Gauge: 21 and 21 G    Additional Needles:  Procedures: ultrasound guided (picture in chart) Adductor canal block Narrative:  Injection made incrementally with aspirations every 5 mL.  Performed by: Personally  Anesthesiologist: Keyshun Elpers  Additional Notes: Risks, benefits and alternative to block explained extensively.  Patient tolerated procedure well, without complications.

## 2015-03-16 NOTE — Transfer of Care (Signed)
Immediate Anesthesia Transfer of Care Note  Patient: Jacqueline Lucas  Procedure(s) Performed: Procedure(s): RIGHT TOTAL KNEE ARTHROPLASTY, PCL SACRIFICING (Right)  Patient Location: PACU  Anesthesia Type:General  Level of Consciousness: awake, alert , oriented and patient cooperative  Airway & Oxygen Therapy: Patient Spontanous Breathing and Patient connected to nasal cannula oxygen  Post-op Assessment: Report given to RN and Post -op Vital signs reviewed and stable  Post vital signs: Reviewed and stable  Last Vitals:  Filed Vitals:   03/16/15 1000 03/16/15 1357  BP: 142/62 117/59  Pulse: 70 100  Temp:  36.5 C  Resp: 20 21    Complications: No apparent anesthesia complications

## 2015-03-16 NOTE — H&P (Signed)
TOTAL KNEE ADMISSION H&P  Patient is being admitted for right total knee arthroplasty.  Subjective:  Chief Complaint:right knee pain.  HPI: Jacqueline Lucas, 65 y.o. female, has a history of pain and functional disability in the right knee due to arthritis and has failed non-surgical conservative treatments for greater than 12 weeks to includeNSAID's and/or analgesics, corticosteriod injections, flexibility and strengthening excercises, use of assistive devices and activity modification.  Onset of symptoms was gradual, starting 8 years ago with gradually worsening course since that time. The patient noted no past surgery on the right knee(s).  Patient currently rates pain in the right knee(s) at 9 out of 10 with activity. Patient has night pain, worsening of pain with activity and weight bearing, pain that interferes with activities of daily living, pain with passive range of motion, crepitus and joint swelling.  Patient has evidence of subchondral sclerosis and joint space narrowing by imaging studies. This patient has had good result with left TKA. There is no active infection.  Patient Active Problem List   Diagnosis Date Noted  . Atrophic vaginitis 01/31/2015  . Gross hematuria 12/31/2014  . Recurrent UTI 12/31/2014  . Personal history of other infectious and parasitic diseases 12/15/2014  . IBS (irritable bowel syndrome) 10/29/2014  . Abdominal pain in female 10/14/2014  . Diarrhea 10/14/2014  . Acute anxiety 09/08/2014  . Asthma, moderate persistent 09/08/2014  . Allergic state 09/07/2014  . Colon polyp 09/07/2014  . Hemorrhoid 09/07/2014  . Personal history of infectious and parasitic disease 09/07/2014  . Chronic pain 09/07/2014  . Constrictive tenosynovitis 03/12/2014  . H/O total knee replacement 07/31/2013  . Angina pectoris ([place]) 11/28/2012  . Anxiety 11/28/2012  . Feeling bilious 11/28/2012  . Fibrositis 11/28/2012  . Acid reflux 11/28/2012  . Cardiac murmur 11/28/2012   . History of migraine headaches 11/28/2012  . BP (high blood pressure) 11/28/2012  . Cannot sleep 11/28/2012  . Adiposity 11/28/2012  . Arthritis, degenerative 11/28/2012  . Awareness of heartbeats 11/28/2012  . Restless leg 11/28/2012  . Supraventricular tachycardia ([place]) 11/28/2012  . Fibromyalgia 11/28/2012   Past Medical History  Diagnosis Date  . Hypertension   . Asthma   . Hyperlipidemia   . Thyroid disease   . Bronchitis   . Lumbar neuritis   . Shoulder fracture, right   . Anxiety   . Arthritis   . Allergy   . Cataract   . Depression   . GERD (gastroesophageal reflux disease)   . Cancer of both ovaries ([place])   . Insomnia   . RLS (restless legs syndrome)   . Migraines   . Chronic nausea   . Alzheimer disease   . Obesity   . Tachycardia   . Throat cancer ([place])   . Anginal pain ([place])   . Palpitation   . Fibromyalgia   . Heart murmur   . Hematuria   . Ovarian cancer ([place])   . IBS (irritable bowel syndrome)   . PONV (postoperative nausea and vomiting)   . Claustrophobia   . H/O bladder infections   . Allergy to adhesive tape     Allergy to paper tape as well  . Dry eyes     uses Restasis    Past Surgical History  Procedure Laterality Date  . Replacement total knee Left   . Total hip arthroplasty Left 2013  . Hemorrhoid surgery    . Abdominal hysterectomy      due to cancer of ovaries  . Spine surgery  x 2  . Tendonititis      right elbow, right wrist  . Back surgery N/A 2014    x 5  . Colonoscopy w/ polypectomy    . Cardiac catheterization      no PCI; 12/18/12 ([facility], Dr. Marcello Moores): EP study with ablation. No coronary angiography done then.    No prescriptions prior to admission   Allergies  Allergen Reactions  . Morphine Nausea And Vomiting    Pt prefers to use over all other strong pain meds  . Diazepam Nausea And Vomiting    Other reaction(s): Vomiting  . Salicylates     Other reaction(s): Headache  . Tape   . Rash Away   [Petrolatum-Zinc Oxide] Rash and Swelling    Social History  Substance Use Topics  . Smoking status: Never Smoker   . Smokeless tobacco: Never Used  . Alcohol Use: No    Family History  Problem Relation Age of Onset  . Diabetes Mother   . Hypertension Mother   . Heart disease Mother   . Diabetes Father   . Cancer Father   . Hypertension Sister   . Depression Sister   . Cancer Brother   . Bladder Cancer Neg Hx   . Kidney disease Neg Hx      Review of Systems  Constitutional: Negative.   HENT: Negative.   Eyes: Negative.   Respiratory: Negative.   Cardiovascular: Negative.   Gastrointestinal: Negative.   Genitourinary: Negative.   Musculoskeletal: Positive for joint pain.  Skin: Negative.   Neurological: Negative.   Endo/Heme/Allergies: Negative.   Psychiatric/Behavioral: Negative.     Objective:  Physical Exam  Constitutional: She appears well-developed.  HENT:  Head: Normocephalic.  Eyes: Pupils are equal, round, and reactive to light.  Neck: Normal range of motion.  Cardiovascular: Normal rate.   Respiratory: Effort normal.  Neurological: She is alert.  Skin: Skin is warm.  Psychiatric: She has a normal mood and affect.  right knee skin ok - dp 2/4 - ankle df pf ok - collaterals ok - ext mech ok - rom 5 - 110  Vital signs in last 24 hours:    Labs:   Estimated body mass index is 38.19 kg/(m^2) as calculated from the following:   Height as of 01/27/15: 5\' 4"  (1.626 m).   Weight as of 01/27/15: 100.971 kg (222 lb 9.6 oz).   Imaging Review Plain radiographs demonstrate moderate degenerative joint disease of the right knee(s). The overall alignment ismild varus. The bone quality appears to be good for age and reported activity level.  Assessment/Plan:  End stage arthritis, right knee   The patient history, physical examination, clinical judgment of the provider and imaging studies are consistent with end stage degenerative joint disease of the right  knee(s) and total knee arthroplasty is deemed medically necessary. The treatment options including medical management, injection therapy arthroscopy and arthroplasty were discussed at length. The risks and benefits of total knee arthroplasty were presented and reviewed. The risks due to aseptic loosening, infection, stiffness, patella tracking problems, thromboembolic complications and other imponderables were discussed. The patient acknowledged the explanation, agreed to proceed with the plan and consent was signed. Patient is being admitted for inpatient treatment for surgery, pain control, PT, OT, prophylactic antibiotics, VTE prophylaxis, progressive ambulation and ADL's and discharge planning. The patient is planning to be discharged home with home health services

## 2015-03-16 NOTE — Consult Note (Signed)
[place] for : Coumadin  HPI/Indication : 66 y.o.female admitted RIGHT KNEE OSTEOARTHRITIS who is to be started on warfarin for VTE prophylaxis following R-TKA.  Allergies: Allergies  Allergen Reactions  . Morphine Nausea And Vomiting    Pt prefers to use over all other strong pain meds  . Diazepam Nausea And Vomiting    Other reaction(s): Vomiting  . Salicylates     Other reaction(s): Headache  . Tape   . Rash Away  [Petrolatum-Zinc Oxide] Rash and Swelling   Dosing weight : 103.2 kg  Problems: Active Problems:   Degenerative arthritis of right knee   Medical / Surgical History: Past Medical History  Diagnosis Date  . Hypertension   . Asthma   . Hyperlipidemia   . Thyroid disease   . Bronchitis   . Lumbar neuritis   . Shoulder fracture, right   . Anxiety   . Arthritis   . Allergy   . Cataract   . Depression   . GERD (gastroesophageal reflux disease)   . Cancer of both ovaries ([place])   . Insomnia   . RLS (restless legs syndrome)   . Migraines   . Chronic nausea   . Alzheimer disease   . Obesity   . Tachycardia   . Throat cancer ([place])   . Anginal pain ([place])   . Palpitation   . Fibromyalgia   . Heart murmur   . Hematuria   . Ovarian cancer ([place])   . IBS (irritable bowel syndrome)   . PONV (postoperative nausea and vomiting)   . Claustrophobia   . H/O bladder infections   . Allergy to adhesive tape     Allergy to paper tape as well  . Dry eyes     uses Restasis   Past Surgical History  Procedure Laterality Date  . Replacement total knee Left   . Total hip arthroplasty Left 2013  . Hemorrhoid surgery    . Abdominal hysterectomy      due to cancer of ovaries  . Spine surgery      x 2  . Tendonititis      right elbow, right wrist  . Back surgery N/A 2014    x 5  . Colonoscopy w/ polypectomy    . Cardiac catheterization      no PCI; 12/18/12 ([facility], Dr. Marcello Moores): EP study with ablation. No coronary  angiography done then.    Labs:  Recent Labs  03/16/15 2032  HGB 11.2*  HCT 35.5*  PLT 255  LABPROT 14.8  INR 1.14    Estimated Creatinine Clearance: 69.8 mL/min (by C-G formula based on Cr of 0.94).  Current Medication[s] Include: Medication PTA: Facility-administered medications prior to admission  Medication Dose Route Frequency Provider Last Rate Last Dose  . albuterol (PROVENTIL) (2.5 MG/3ML) 0.083% nebulizer solution 2.5 mg  2.5 mg Nebulization Once Ashok Norris, MD       Prescriptions prior to admission  Medication Sig Dispense Refill Last Dose  . albuterol (PROAIR HFA) 108 (90 BASE) MCG/ACT inhaler Inhale 2 puffs into the lungs every 6 (six) hours as needed for wheezing or shortness of breath. 1 Inhaler 1 03/16/2015 at 0700  . aspirin EC 81 MG tablet Take 81 mg by mouth daily.    Past Week at Unknown time  . atorvastatin (LIPITOR) 40 MG tablet Take 1 tablet (40 mg total) by mouth at bedtime. 90 tablet 1 03/16/2015 at 0700  . baclofen (LIORESAL) 10 MG tablet  Take 10 mg by mouth 2 (two) times daily as needed.    03/15/2015  . Cholecalciferol (D3-1000) 1000 units tablet Take 1,000 Units by mouth daily.   Past Week at Unknown time  . clonazePAM (KLONOPIN) 0.5 MG tablet Take 1 tablet (0.5 mg total) by mouth 2 (two) times daily. 180 tablet 0 03/16/2015 at 0700  . cyclobenzaprine (FLEXERIL) 10 MG tablet Take 10 mg by mouth 3 (three) times daily as needed for muscle spasms.    Past Month at Unknown time  . cycloSPORINE (RESTASIS) 0.05 % ophthalmic emulsion Apply 1 drop to eye 2 (two) times daily.    03/16/2015 at 0700  . diclofenac (VOLTAREN) 75 MG EC tablet TAKE 1 TABLET BY MOUTH TWICE A DAY WITH MEALS FOR 7 DAYS THEN AS NEEDED FOR PAIN  1 Past Week at Unknown time  . diclofenac sodium (VOLTAREN) 1 % GEL Apply 1 application topically 4 (four) times daily as needed.    Past Month at Unknown time  . estradiol (ESTRACE) 0.1 MG/GM vaginal cream Apply 0.5mg  (pea-sized amount)  just inside  the vaginal introitus with a finger-tip every night for two weeks and then Monday, Wednesday and Friday nights.  adverse systemic effects that oral HT. 127.5 g 3 03/15/2015 at Unknown time  . fenofibrate (TRICOR) 145 MG tablet Take 1 tablet (145 mg total) by mouth daily. 90 tablet 2 03/16/2015 at 0700  . fluticasone (FLONASE) 50 MCG/ACT nasal spray Place 1 spray into both nostrils daily as needed for allergies.    Past Month at Unknown time  . gabapentin (NEURONTIN) 300 MG capsule Take 900 mg by mouth 3 (three) times daily.    03/16/2015 at 0700  . lansoprazole (PREVACID) 30 MG capsule Take 1 capsule (30 mg total) by mouth 2 (two) times daily before a meal. 180 capsule 3 03/16/2015 at 0700  . levalbuterol (XOPENEX) 1.25 MG/3ML nebulizer solution Take 1.25 mg by nebulization every 4 (four) hours as needed for wheezing. 72 mL 4 Past Month at Unknown time  . levothyroxine (SYNTHROID) 88 MCG tablet Take 88 mcg by mouth daily before breakfast.    03/16/2015 at 0700  . magnesium oxide (MAG-OX) 400 MG tablet Take 400 mg by mouth daily.    Past Week at Unknown time  . mometasone-formoterol (DULERA) 200-5 MCG/ACT AERO Inhale 2 puffs into the lungs daily.    03/16/2015 at 0700  . montelukast (SINGULAIR) 10 MG tablet Take 1 tablet (10 mg total) by mouth at bedtime. 80 tablet 3 03/15/2015 at Unknown time  . ondansetron (ZOFRAN) 4 MG tablet Take 1 tablet (4 mg total) by mouth every 8 (eight) hours as needed for nausea or vomiting. 20 tablet 0 Past Week at Unknown time  . oxyCODONE-acetaminophen (PERCOCET) 7.5-325 MG tablet Take 1 tablet by mouth every 8 (eight) hours as needed for severe pain. 90 tablet 0 Past Week at Unknown time  . Polyethyl Glycol-Propyl Glycol 0.4-0.3 % SOLN Apply 3 drops to eye as needed.    Past Week at Unknown time  . valsartan-hydrochlorothiazide (DIOVAN-HCT) 160-12.5 MG tablet Take 1 tablet by mouth daily. 90 tablet 0 03/15/2015 at Unknown time  . venlafaxine XR (EFFEXOR-XR) 75 MG 24 hr capsule  Take 75 mg by mouth daily with breakfast.   03/16/2015 at 0700  . vitamin B-12 (CYANOCOBALAMIN) 1000 MCG tablet Take 1,000 mcg by mouth daily.   Past Week at Unknown time   Scheduled:  Scheduled:  . albuterol  2.5 mg Nebulization Once  . albuterol  2.5 mg Nebulization Q6H  . [START ON 03/17/2015] atorvastatin  40 mg Oral q1800  .  ceFAZolin (ANCEF) IV  2 g Intravenous Q6H  . [START ON 03/17/2015] cholecalciferol  1,000 Units Oral Daily  . clonazePAM  0.5 mg Oral BID  . [START ON 03/17/2015] fenofibrate  145 mg Oral Daily  . gabapentin  900 mg Oral TID  . [START ON 03/17/2015] hydrochlorothiazide  12.5 mg Oral Daily  . HYDROmorphone      . [START ON 03/17/2015] irbesartan  150 mg Oral Daily  . [START ON 03/17/2015] levothyroxine  88 mcg Oral QAC breakfast  . [START ON 03/17/2015] magnesium oxide  400 mg Oral Daily  . [START ON 03/17/2015] mometasone-formoterol  2 puff Inhalation Daily  . montelukast  10 mg Oral QHS  . oxyCODONE      . oxyCODONE      . [START ON 03/17/2015] pantoprazole  20 mg Oral Daily  . [START ON 03/17/2015] venlafaxine XR  75 mg Oral Q breakfast  . [START ON 03/17/2015] vitamin B-12  1,000 mcg Oral Daily   Infusion[s]: Infusions:  . 0.9 % NaCl with KCl 20 mEq / L     Antibiotic[s]: Anti-infectives    Start     Dose/Rate Route Frequency Ordered Stop   03/16/15 2000  ceFAZolin (ANCEF) IVPB 2 g/50 mL premix     2 g 100 mL/hr over 30 Minutes Intravenous Every 6 hours 03/16/15 1938 03/17/15 0759   03/16/15 1030  ceFAZolin (ANCEF) IVPB 2 g/50 mL premix     2 g 100 mL/hr over 30 Minutes Intravenous To [facility] 03/15/15 1334 03/16/15 1115     Assessment:  65 y/o female s/p R-TKA who is to be started on Coumadin post-op R-TKA for VTE prophylaxis.  She has not been on any Anticoagulation PTA.  Goals:  Target INR of 2-3.  Plan: 1. Will give Coumadin 5 mg po tonight. 2. Daily INR's, CBC.  Monitor for bleeding complications. Follow platelet  counts. 3. Coumadin discharge education pending.  Jaymere Alen, Zettie Pho,  Pharm.D. 03/16/2015, 9:45 PM

## 2015-03-17 ENCOUNTER — Encounter (HOSPITAL_COMMUNITY): Payer: Self-pay | Admitting: Orthopedic Surgery

## 2015-03-17 LAB — CBC
HCT: 32.4 % — ABNORMAL LOW (ref 36.0–46.0)
Hemoglobin: 10.3 g/dL — ABNORMAL LOW (ref 12.0–15.0)
MCH: 27.9 pg (ref 26.0–34.0)
MCHC: 31.8 g/dL (ref 30.0–36.0)
MCV: 87.8 fL (ref 78.0–100.0)
Platelets: 247 10*3/uL (ref 150–400)
RBC: 3.69 MIL/uL — ABNORMAL LOW (ref 3.87–5.11)
RDW: 15 % (ref 11.5–15.5)
WBC: 11.2 10*3/uL — ABNORMAL HIGH (ref 4.0–10.5)

## 2015-03-17 LAB — BASIC METABOLIC PANEL
Anion gap: 6 (ref 5–15)
BUN: 10 mg/dL (ref 6–20)
CO2: 28 mmol/L (ref 22–32)
Calcium: 8.3 mg/dL — ABNORMAL LOW (ref 8.9–10.3)
Chloride: 103 mmol/L (ref 101–111)
Creatinine, Ser: 0.91 mg/dL (ref 0.44–1.00)
GFR calc Af Amer: >60 mL/min (ref >60)
GFR calc non Af Amer: >60 mL/min (ref >60)
Glucose, Bld: 130 mg/dL — ABNORMAL HIGH (ref 65–99)
Potassium: 3.6 mmol/L (ref 3.5–5.1)
Sodium: 137 mmol/L (ref 135–145)

## 2015-03-17 LAB — PROTIME-INR
INR: 1.18 (ref 0.00–1.49)
Prothrombin Time: 15.2 seconds (ref 11.6–15.2)

## 2015-03-17 MED ORDER — WARFARIN VIDEO
Freq: Once | Status: AC
  Administered 2015-03-17: 11:00:00

## 2015-03-17 MED ORDER — COUMADIN BOOK
Freq: Once | Status: AC
  Administered 2015-03-17: 11:00:00
  Filled 2015-03-17: qty 1

## 2015-03-17 MED ORDER — WARFARIN SODIUM 5 MG PO TABS
5.0000 mg | ORAL_TABLET | Freq: Once | ORAL | Status: AC
  Administered 2015-03-17: 5 mg via ORAL
  Filled 2015-03-17: qty 1

## 2015-03-17 NOTE — Progress Notes (Signed)
ANTICOAGULATION CONSULT NOTE - Follow Up Consult  Pharmacy Consult for Coumadin Indication: VTE prophylaxis  Allergies  Allergen Reactions  . Morphine Nausea And Vomiting    Pt prefers to use over all other strong pain meds  . Diazepam Nausea And Vomiting    Other reaction(s): Vomiting  . Salicylates     Other reaction(s): Headache  . Tape   . Rash Away  [Petrolatum-Zinc Oxide] Rash and Swelling    Patient Measurements: Height: 5\' 4"  (162.6 cm) Weight: 227 lb 8.2 oz (103.199 kg) IBW/kg (Calculated) : 54.7  Vital Signs: Temp: 98.8 F (37.1 C) (01/18 0544) Temp Source: Oral (01/18 0544) BP: 105/58 mmHg (01/18 0544) Pulse Rate: 80 (01/18 0544)  Labs:  Recent Labs  03/16/15 2032 03/17/15 0620  HGB 11.2* 10.3*  HCT 35.5* 32.4*  PLT 255 247  LABPROT 14.8 15.2  INR 1.14 1.18  CREATININE  --  0.91    Estimated Creatinine Clearance: 72.1 mL/min (by C-G formula based on Cr of 0.91).  Assessment: 64yof POD #1 right TKA was started on coumadin yesterday for VTE prophylaxis. INR is 1.18 after first dose. CBC stable. No bleeding issues.  Goal of Therapy:  INR 2-3 Monitor platelets by anticoagulation protocol: Yes   Plan:  1) Repeat coumadin 5mg  x 1 2) Daily INR  Deboraha Sprang 03/17/2015,10:21 AM

## 2015-03-17 NOTE — Progress Notes (Signed)
Physical Therapy Treatment Patient Details Name: Jacqueline Lucas MRN: [id number] DOB: [birth year] Today's Date: 03/17/2015    History of Present Illness 65 y.o. female s/p Rt TKA. PMH: Lt TKA, fibromyalgia, chronic nausea, tachycardia, anginal pain, IBS, claustraphobia, allergy adhesive tape    PT Comments    Patient progressing slowly with PT, having difficulty with pain control at this time. Multiple attempts made in afternoon to work with patient but having to wait due to pain. During PT session, patient able to increase her ambulation but speed and distance remain limited. PT to continue to follow to advance mobility as tolerated.   Follow Up Recommendations  Home health PT;Supervision for mobility/OOB     Equipment Recommendations  None recommended by PT    Recommendations for Other Services       Precautions / Restrictions Precautions Precautions: Knee;Fall Required Braces or Orthoses: Knee Immobilizer - Right Knee Immobilizer - Right: On when out of bed or walking Restrictions Weight Bearing Restrictions: Yes RLE Weight Bearing: Weight bearing as tolerated    Mobility  Bed Mobility Overal bed mobility: Needs Assistance Bed Mobility: Supine to Sit     Supine to sit: Mod assist     General bed mobility comments: assist with Rt LE, patient using rails to get to sitting, contact guard at trunk  Transfers Overall transfer level: Needs assistance Equipment used: Rolling walker (2 wheeled) Transfers: Sit to/from Stand Sit to Stand: Min assist         General transfer comment: reminder for hand position  Ambulation/Gait Ambulation/Gait assistance: Min guard Ambulation Distance (Feet): 25 Feet Assistive device: Rolling walker (2 wheeled) Gait Pattern/deviations: Step-to pattern;Decreased step length - right;Decreased step length - left;Decreased weight shift to right Gait velocity: decreased       Stairs            Wheelchair Mobility     Modified Rankin (Stroke Patients Only)       Balance Overall balance assessment: Needs assistance Sitting-balance support: No upper extremity supported Sitting balance-Leahy Scale: Good     Standing balance support: Bilateral upper extremity supported Standing balance-Leahy Scale: Poor Standing balance comment: using rw                    Cognition Arousal/Alertness: Awake/alert Behavior During Therapy: WFL for tasks assessed/performed Overall Cognitive Status: Within Functional Limits for tasks assessed                      Exercises Total Joint Exercises Ankle Circles/Pumps: AROM;Both;10 reps Quad Sets: Strengthening;Right;10 reps Heel Slides: AAROM;Right;10 reps    General Comments        Pertinent Vitals/Pain Pain Assessment: 0-10 Pain Score: 8  Pain Location: rt knee Pain Descriptors / Indicators: Aching;Sharp Pain Intervention(s): Limited activity within patient's tolerance;Monitored during session;Premedicated before session    Home Living Family/patient expects to be discharged to:: Private residence Living Arrangements: Spouse/significant other                  Prior Function            PT Goals (current goals can now be found in the care plan section) Acute Rehab PT Goals Patient Stated Goal: Be able to go home  PT Goal Formulation: With patient Time For Goal Achievement: 03/31/15 Potential to Achieve Goals: Good Progress towards PT goals: Progressing toward goals    Frequency  7X/week    PT Plan Current plan remains appropriate    Co-evaluation  End of Session Equipment Utilized During Treatment: Gait belt;Right knee immobilizer Activity Tolerance: Patient limited by pain;Patient limited by fatigue Patient left: in chair;with call bell/phone within reach;with family/visitor present     Time: 1520-1549 PT Time Calculation (min) (ACUTE ONLY): 29 min  Charges:  $Gait Training: 8-22  mins $Therapeutic Exercise: 8-22 mins                    G Codes:      Cassell Clement, PT, CSCS Pager [phone] Office [phone]  03/17/2015, 3:56 PM

## 2015-03-17 NOTE — Discharge Instructions (Signed)
Information on my medicine - Coumadin   (Warfarin)  This medication education was reviewed with me or my healthcare representative as part of my discharge preparation.  The pharmacist that spoke with me during my hospital stay was:  Deboraha Sprang, [facility]  Why was Coumadin prescribed for you? Coumadin was prescribed for you because you have a blood clot or a medical condition that can cause an increased risk of forming blood clots. Blood clots can cause serious health problems by blocking the flow of blood to the heart, lung, or brain. Coumadin can prevent harmful blood clots from forming. As a reminder your indication for Coumadin is:   Blood Clot Prevention After Orthopedic Surgery  What test will check on my response to Coumadin? While on Coumadin (warfarin) you will need to have an INR test regularly to ensure that your dose is keeping you in the desired range. The INR (international normalized ratio) number is calculated from the result of the laboratory test called prothrombin time (PT).  If an INR APPOINTMENT HAS NOT ALREADY BEEN MADE FOR YOU please schedule an appointment to have this lab work done by your health care provider within 7 days. Your INR goal is usually a number between:  2 to 3 or your provider may give you a more narrow range like 2-2.5.  Ask your health care provider during an office visit what your goal INR is.  What  do you need to  know  About  COUMADIN? Take Coumadin (warfarin) exactly as prescribed by your healthcare provider about the same time each day.  DO NOT stop taking without talking to the doctor who prescribed the medication.  Stopping without other blood clot prevention medication to take the place of Coumadin may increase your risk of developing a new clot or stroke.  Get refills before you run out.  What do you do if you miss a dose? If you miss a dose, take it as soon as you remember on the same day then continue your regularly scheduled regimen the  next day.  Do not take two doses of Coumadin at the same time.  Important Safety Information A possible side effect of Coumadin (Warfarin) is an increased risk of bleeding. You should call your healthcare provider right away if you experience any of the following: ? Bleeding from an injury or your nose that does not stop. ? Unusual colored urine (red or dark brown) or unusual colored stools (red or black). ? Unusual bruising for unknown reasons. ? A serious fall or if you hit your head (even if there is no bleeding).  Some foods or medicines interact with Coumadin (warfarin) and might alter your response to warfarin. To help avoid this: ? Eat a balanced diet, maintaining a consistent amount of Vitamin K. ? Notify your provider about major diet changes you plan to make. ? Avoid alcohol or limit your intake to 1 drink for women and 2 drinks for men per day. (1 drink is 5 oz. wine, 12 oz. beer, or 1.5 oz. liquor.)  Make sure that ANY health care provider who prescribes medication for you knows that you are taking Coumadin (warfarin).  Also make sure the healthcare provider who is monitoring your Coumadin knows when you have started a new medication including herbals and non-prescription products.  Coumadin (Warfarin)  Major Drug Interactions  Increased Warfarin Effect Decreased Warfarin Effect  Alcohol (large quantities) Antibiotics (esp. Septra/Bactrim, Flagyl, Cipro) Amiodarone (Cordarone) Aspirin (ASA) Cimetidine (Tagamet) Megestrol (Megace) NSAIDs (ibuprofen,  naproxen, etc.) °Piroxicam (Feldene) °Propafenone (Rythmol SR) °Propranolol (Inderal) °Isoniazid (INH) °Posaconazole (Noxafil) Barbiturates (Phenobarbital) °Carbamazepine (Tegretol) °Chlordiazepoxide (Librium) °Cholestyramine (Questran) °Griseofulvin °Oral Contraceptives °Rifampin °Sucralfate (Carafate) °Vitamin K  ° °Coumadin® (Warfarin) Major Herbal Interactions  °Increased Warfarin Effect Decreased Warfarin Effect   °Garlic °Ginseng °Ginkgo biloba Coenzyme Q10 °Green tea °St. John’s wort   ° °Coumadin® (Warfarin) FOOD Interactions  °Eat a consistent number of servings per week of foods HIGH in Vitamin K °(1 serving = ½ cup)  °Collards (cooked, or boiled & drained) °Kale (cooked, or boiled & drained) °Mustard greens (cooked, or boiled & drained) °Parsley *serving size only = ¼ cup °Spinach (cooked, or boiled & drained) °Swiss chard (cooked, or boiled & drained) °Turnip greens (cooked, or boiled & drained)  °Eat a consistent number of servings per week of foods MEDIUM-HIGH in Vitamin K °(1 serving = 1 cup)  °Asparagus (cooked, or boiled & drained) °Broccoli (cooked, boiled & drained, or raw & chopped) °Brussel sprouts (cooked, or boiled & drained) *serving size only = ½ cup °Lettuce, raw (green leaf, endive, romaine) °Spinach, raw °Turnip greens, raw & chopped  ° °These websites have more information on Coumadin (warfarin):  www.coumadin.com; °www.ahrq.gov/consumer/coumadin.htm; ° ° °

## 2015-03-17 NOTE — Evaluation (Addendum)
Physical Therapy Evaluation Patient Details Name: Jacqueline Lucas MRN: [id number] DOB: [birth year] Today's Date: 03/17/2015   History of Present Illness  65 y.o. female s/p Rt TKA. PMH: Lt TKA, fibromyalgia, chronic nausea, tachycardia, anginal pain, IBS, claustraphobia, allergy adhesive tape  Clinical Impression  Pt is s/p TKA resulting in the deficits listed below (see PT Problem List).  Pt will benefit from skilled PT to increase their independence and safety with mobility to allow discharge to the venue listed below. Patient able to ambulate 15 feet X2 with min guard assist and rw. Anticipating patient will D/C to home with husband to assist. PT to continue to follow to progress mobility.      Follow Up Recommendations Home health PT;Supervision for mobility/OOB    Equipment Recommendations  None recommended by PT    Recommendations for Other Services       Precautions / Restrictions Precautions Precautions: Knee;Fall Required Braces or Orthoses: Knee Immobilizer - Right Knee Immobilizer - Right: On when out of bed or walking Restrictions Weight Bearing Restrictions: Yes RLE Weight Bearing: Weight bearing as tolerated      Mobility  Bed Mobility Overal bed mobility: Needs Assistance Bed Mobility: Supine to Sit     Supine to sit: Mod assist     General bed mobility comments: assist with Rt LE, patient using rails to get to sitting, contact guard at trunk  Transfers Overall transfer level: Needs assistance Equipment used: Rolling walker (2 wheeled) Transfers: Sit to/from Stand Sit to Stand: Min assist         General transfer comment: reminder for hand position  Ambulation/Gait Ambulation/Gait assistance: Min guard Ambulation Distance (Feet): 25 Feet Assistive device: Rolling walker (2 wheeled) Gait Pattern/deviations: Step-to pattern;Decreased step length - right;Decreased step length - left;Decreased weight shift to right Gait velocity: decreased       Stairs            Wheelchair Mobility    Modified Rankin (Stroke Patients Only)       Balance Overall balance assessment: Needs assistance Sitting-balance support: No upper extremity supported Sitting balance-Leahy Scale: Good     Standing balance support: Bilateral upper extremity supported Standing balance-Leahy Scale: Poor Standing balance comment: using rw                             Pertinent Vitals/Pain Pain Assessment: 0-10 Pain Score: 8  Pain Location: rt knee Pain Descriptors / Indicators: Aching;Sharp Pain Intervention(s): Limited activity within patient's tolerance;Monitored during session;Premedicated before session    Home Living Family/patient expects to be discharged to:: Private residence Living Arrangements: Spouse/significant other                    Prior Function                 Hand Dominance        Extremity/Trunk Assessment                         Communication      Cognition Arousal/Alertness: Awake/alert Behavior During Therapy: WFL for tasks assessed/performed Overall Cognitive Status: Within Functional Limits for tasks assessed                      General Comments      Exercises Total Joint Exercises Ankle Circles/Pumps: AROM;Both;10 reps Quad Sets: Strengthening;Right;10 reps Heel Slides: AAROM;Right;10 reps  Assessment/Plan    PT Assessment    PT Diagnosis     PT Problem List    PT Treatment Interventions     PT Goals (Current goals can be found in the Care Plan section) Acute Rehab PT Goals Patient Stated Goal: Be able to go home  PT Goal Formulation: With patient Time For Goal Achievement: 03/31/15 Potential to Achieve Goals: Good    Frequency 7X/week   Barriers to discharge        Co-evaluation               End of Session Equipment Utilized During Treatment: Gait belt;Right knee immobilizer Activity Tolerance: Patient limited by  pain;Patient limited by fatigue Patient left: in chair;with call bell/phone within reach;with family/visitor present Nurse Communication: Mobility status;Precautions;Weight bearing status         Time: 1520-1549 PT Time Calculation (min) (ACUTE ONLY): 29 min   Charges:     PT Treatments $Gait Training: 8-22 mins $Therapeutic Exercise: 8-22 mins   PT G Codes:        Cassell Clement, PT, CSCS Pager [phone] Office [phone]  03/17/2015, 4:00 PM

## 2015-03-17 NOTE — Progress Notes (Signed)
Subjective: Pt stable - having pain   Objective: Vital signs in last 24 hours: Temp:  [96.8 F (36 C)-99.3 F (37.4 C)] 98.8 F (37.1 C) (01/18 0544) Pulse Rate:  [67-112] 80 (01/18 0544) Resp:  [9-29] 18 (01/18 0544) BP: (96-166)/(36-87) 105/58 mmHg (01/18 0544) SpO2:  [91 %-100 %] 99 % (01/18 0544) Weight:  [103.199 kg (227 lb 8.2 oz)] 103.199 kg (227 lb 8.2 oz) (01/17 0859)  Intake/Output from previous day: 01/17 0701 - 01/18 0700 In: 1720 [P.O.:120; I.V.:1500; IV Piggyback:100] Out: 600 [Urine:550; Blood:50] Intake/Output this shift:    Exam:  Intact pulses distally Dorsiflexion/Plantar flexion intact  Labs:  Recent Labs  03/16/15 2032  HGB 11.2*    Recent Labs  03/16/15 2032  WBC 13.1*  RBC 4.10  HCT 35.5*  PLT 255   No results for input(s): NA, K, CL, CO2, BUN, CREATININE, GLUCOSE, CALCIUM in the last 72 hours.  Recent Labs  03/16/15 2032  INR 1.14    Assessment/Plan: Plan PT and CPM today   Kenadie Royce SCOTT 03/17/2015, 7:13 AM

## 2015-03-18 LAB — CBC
HCT: 31.4 % — ABNORMAL LOW (ref 36.0–46.0)
Hemoglobin: 10 g/dL — ABNORMAL LOW (ref 12.0–15.0)
MCH: 28 pg (ref 26.0–34.0)
MCHC: 31.8 g/dL (ref 30.0–36.0)
MCV: 88 fL (ref 78.0–100.0)
Platelets: 215 10*3/uL (ref 150–400)
RBC: 3.57 MIL/uL — ABNORMAL LOW (ref 3.87–5.11)
RDW: 15.2 % (ref 11.5–15.5)
WBC: 8.8 10*3/uL (ref 4.0–10.5)

## 2015-03-18 LAB — PROTIME-INR
INR: 1.61 — ABNORMAL HIGH (ref 0.00–1.49)
Prothrombin Time: 19.2 seconds — ABNORMAL HIGH (ref 11.6–15.2)

## 2015-03-18 MED ORDER — CLONAZEPAM 0.5 MG PO TABS: 0.5000 mg | ORAL_TABLET | Freq: Once | ORAL | Status: DC | PRN

## 2015-03-18 MED ORDER — WARFARIN SODIUM 5 MG PO TABS: 2.5000 mg | ORAL_TABLET | Freq: Once | ORAL | Status: DC

## 2015-03-18 MED ORDER — OXYCODONE HCL 5 MG PO TABS: 5.0000 mg | ORAL_TABLET | ORAL | Status: DC | PRN

## 2015-03-18 MED ORDER — WARFARIN SODIUM 5 MG PO TABS: 5.0000 mg | ORAL_TABLET | Freq: Every day | ORAL | Status: DC

## 2015-03-18 NOTE — Progress Notes (Signed)
ANTICOAGULATION CONSULT NOTE - Follow Up Consult  Pharmacy Consult:  Coumadin Indication: VTE prophylaxis  Allergies  Allergen Reactions  . Morphine Nausea And Vomiting    Pt prefers to use over all other strong pain meds  . Diazepam Nausea And Vomiting    Other reaction(s): Vomiting  . Salicylates     Other reaction(s): Headache  . Tape   . Rash Away  [Petrolatum-Zinc Oxide] Rash and Swelling    Patient Measurements: Height: 5\' 4"  (162.6 cm) Weight: 227 lb 8.2 oz (103.199 kg) IBW/kg (Calculated) : 54.7  Vital Signs: Temp: 98.9 F (37.2 C) (01/19 0631) BP: 132/58 mmHg (01/19 0631) Pulse Rate: 83 (01/19 0855)  Labs:  Recent Labs  03/16/15 2032 03/17/15 0620 03/18/15 0620  HGB 11.2* 10.3* 10.0*  HCT 35.5* 32.4* 31.4*  PLT 255 247 215  LABPROT 14.8 15.2 19.2*  INR 1.14 1.18 1.61*  CREATININE  --  0.91  --     Estimated Creatinine Clearance: 72.1 mL/min (by C-G formula based on Cr of 0.91).    Assessment: 49 YOF s/p right TKA on 03/16/15 to continue on Coumadin for VTE prophylaxis.  Patient's INR is trending up toward goal.  No bleeding reported.   Goal of Therapy:  INR 2-3    Plan:  - Coumadin 2.5mg  PO today - Daily PT / INR   Kenadie Royce D. Mina Marble, PharmD, BCPS Pager:  [phone] 03/18/2015, 10:11 AM

## 2015-03-18 NOTE — Progress Notes (Signed)
Physical Therapy Treatment Patient Details Name: Jacqueline Lucas MRN: [id number] DOB: [birth year] Today's Date: 03/18/2015    History of Present Illness 65 y.o. female s/p Rt TKA. PMH: Lt TKA, fibromyalgia, chronic nausea, tachycardia, anginal pain, IBS, claustraphobia, allergy adhesive tape    PT Comments    Patient is making good progress with PT.  From a mobility standpoint anticipate patient will be ready for DC home with spouse assistance. Patient denies any questions or concerns following session.       Follow Up Recommendations  Home health PT;Supervision for mobility/OOB     Equipment Recommendations  None recommended by PT    Recommendations for Other Services       Precautions / Restrictions Precautions Precautions: Knee;Fall Precaution Comments: Reviewed precautions and use of zero degree Required Braces or Orthoses: Knee Immobilizer - Right Knee Immobilizer - Right: On when out of bed or walking Restrictions Weight Bearing Restrictions: Yes RLE Weight Bearing: Weight bearing as tolerated    Mobility  Bed Mobility Overal bed mobility: Needs Assistance Bed Mobility: Supine to Sit     Supine to sit: Mod assist     General bed mobility comments: assist provided with Rt LE by husband  Transfers Overall transfer level: Needs assistance Equipment used: Rolling walker (2 wheeled) Transfers: Sit to/from Stand Sit to Stand: Min guard         General transfer comment: reminder for hand placement  Ambulation/Gait Ambulation/Gait assistance: Min guard Ambulation Distance (Feet): 85 Feet (15 feet to bathroom f/b 70 feet ) Assistive device: Rolling walker (2 wheeled) Gait Pattern/deviations: Decreased step length - right;Decreased step length - left;Step-to pattern Gait velocity: decreased   General Gait Details: no loss of balance    Stairs            Wheelchair Mobility    Modified Rankin (Stroke Patients Only)       Balance Overall  balance assessment: Needs assistance Sitting-balance support: No upper extremity supported Sitting balance-Leahy Scale: Good     Standing balance support: Bilateral upper extremity supported Standing balance-Leahy Scale: Poor Standing balance comment: using rw                    Cognition Arousal/Alertness: Awake/alert Behavior During Therapy: WFL for tasks assessed/performed Overall Cognitive Status: Within Functional Limits for tasks assessed                      Exercises Total Joint Exercises Ankle Circles/Pumps: AROM;Both;10 reps Quad Sets: Strengthening;Right;10 reps Short Arc Quad: Strengthening;Right;5 reps Heel Slides: AAROM;Right;10 reps Hip ABduction/ADduction: Strengthening;Right;5 reps Straight Leg Raises: Strengthening;Right;5 reps;Other (comment) (max assist) Long Arc Quad: Strengthening;Right;5 reps Knee Flexion: AROM Goniometric ROM: seated 71 degrees flexion, 45 degrees with heelslide    General Comments        Pertinent Vitals/Pain Pain Assessment: 0-10 Pain Score: 8  Pain Location: Rt knee Pain Descriptors / Indicators: Aching Pain Intervention(s): Limited activity within patient's tolerance;Monitored during session    Home Living      Prior Function          PT Goals (current goals can now be found in the care plan section) Acute Rehab PT Goals Patient Stated Goal: Be able to go home  PT Goal Formulation: With patient Time For Goal Achievement: 03/31/15 Potential to Achieve Goals: Good Progress towards PT goals: Progressing toward goals    Frequency  7X/week    PT Plan Current plan remains appropriate    Co-evaluation  End of Session Equipment Utilized During Treatment: Gait belt;Right knee immobilizer Activity Tolerance: Patient limited by fatigue Patient left: in chair;with call bell/phone within reach;with family/visitor present     Time: 0824-0907 PT Time Calculation (min) (ACUTE ONLY): 43  min  Charges:  $Gait Training: 8-22 mins $Therapeutic Exercise: 23-37 mins                    G Codes:      Cassell Clement, PT, CSCS Pager [phone] Office [phone]  03/18/2015, 11:41 AM

## 2015-03-18 NOTE — Progress Notes (Signed)
Subjective: Patient stable having some pain this morning but generally she is able to make it around in the room with her husband Fritz Pickerel   Objective: Vital signs in last 24 hours: Temp:  [98.9 F (37.2 C)-100 F (37.8 C)] 98.9 F (37.2 C) (01/19 0631) Pulse Rate:  [82-106] 82 (01/19 0631) Resp:  [18-20] 18 (01/19 0631) BP: (132-147)/(51-58) 132/58 mmHg (01/19 0631) SpO2:  [86 %-96 %] 96 % (01/19 0631)  Intake/Output from previous day:   Intake/Output this shift:    Exam:  Intact pulses distally Dorsiflexion/Plantar flexion intact  Labs:  Recent Labs  03/16/15 2032 03/17/15 0620 03/18/15 0620  HGB 11.2* 10.3* 10.0*    Recent Labs  03/17/15 0620 03/18/15 0620  WBC 11.2* 8.8  RBC 3.69* 3.57*  HCT 32.4* 31.4*  PLT 247 215    Recent Labs  03/17/15 0620  NA 137  K 3.6  CL 103  CO2 28  BUN 10  CREATININE 0.91  GLUCOSE 130*  CALCIUM 8.3*    Recent Labs  03/17/15 0620 03/18/15 0620  INR 1.18 1.61*    Assessment/Plan: Plan discharge today after morning physical therapy. Remove Ace wrap and web roll but leave Mepilex in place. Discharge home on pain medicine and Coumadin   Jacqueline Lucas SCOTT 03/18/2015, 8:08 AM

## 2015-03-18 NOTE — Evaluation (Signed)
Occupational Therapy Evaluation Patient Details Name: Jacqueline Lucas MRN: [id number] DOB: [birth year] Today's Date: 03/18/2015    History of Present Illness 65 y.o. female s/p Rt TKA. PMH: Lt TKA, fibromyalgia, chronic nausea, tachycardia, anginal pain, IBS, claustraphobia, allergy adhesive tape   Clinical Impression   Pt reports she was independent with ADLs PTA. Currently pt is overall min guard for ADLs and functional mobility with the exception of min assist for LB ADLs. All education complete; pt with no further questions or concerns for OT at this time. Pt planning to d/c home with 24/7 supervision from family. Pt ready to d/c from an OT standpoint; signing off at this time. Thank you for this referral.     Follow Up Recommendations  No OT follow up;Supervision/Assistance - 24 hour    Equipment Recommendations  None recommended by OT    Recommendations for Other Services       Precautions / Restrictions Precautions Precautions: Knee;Fall Precaution Comments: Reviewed precautions and use of zero degree Required Braces or Orthoses: Knee Immobilizer - Right Knee Immobilizer - Right: On when out of bed or walking Restrictions Weight Bearing Restrictions: Yes RLE Weight Bearing: Weight bearing as tolerated      Mobility Bed Mobility               General bed mobility comments: Pt OOB in chair.  Transfers Overall transfer level: Needs assistance Equipment used: Rolling walker (2 wheeled) Transfers: Sit to/from Stand Sit to Stand: Min guard         General transfer comment: Good hand placement and technique. KI applied in sitting prior to sit to stand.    Balance Overall balance assessment: Needs assistance Sitting-balance support: No upper extremity supported;Feet supported Sitting balance-Leahy Scale: Good     Standing balance support: Bilateral upper extremity supported;During functional activity Standing balance-Leahy Scale: Poor Standing balance  comment: RW for support                            ADL Overall ADL's : Needs assistance/impaired Eating/Feeding: Set up;Sitting   Grooming: Min guard;Standing       Lower Body Bathing: Minimal assistance;Sit to/from stand       Lower Body Dressing: Minimal assistance;Sit to/from stand Lower Body Dressing Details (indicate cue type and reason): Pt able to don sock on LLE, able to reach RLE but anticipate difficulty starting sock/pants over foot. Pt reports husband can assist as needed. Educated on compensatory strategies for LB ADLs. Toilet Transfer: Min guard;Ambulation;BSC;RW (BSC over toilet)   Toileting- Clothing Manipulation and Hygiene: Min guard;Sit to/from stand   Tub/ Shower Transfer: Min guard;Ambulation;Shower Technical sales engineer Details (indicate cue type and reason): Educated on walk in shower transfer technique; pt verbalized understanding. Functional mobility during ADLs: Min guard;Rolling walker General ADL Comments: Pts husband present for OT eval. Educated on home safety, need for supervision with ADLs and functional mobility, ice for edema and pain, use of zero degree knee; pt verbalized understanding.     Vision     Perception     Praxis      Pertinent Vitals/Pain Pain Assessment: 0-10 Pain Score: 7  Pain Location: R knee Pain Descriptors / Indicators: Aching;Sore Pain Intervention(s): Limited activity within patient's tolerance;Monitored during session;Ice applied;Repositioned     Hand Dominance     Extremity/Trunk Assessment Upper Extremity Assessment Upper Extremity Assessment: Overall WFL for tasks assessed   Lower Extremity Assessment Lower Extremity Assessment: Defer to  PT evaluation       Communication Communication Communication: No difficulties   Cognition Arousal/Alertness: Awake/alert Behavior During Therapy: WFL for tasks assessed/performed Overall Cognitive Status: Within Functional Limits for  tasks assessed                     General Comments       Exercises       Shoulder Instructions      Home Living Family/patient expects to be discharged to:: Private residence Living Arrangements: Spouse/significant other Available Help at Discharge: Family;Available 24 hours/day Type of Home: House Home Access: Level entry     Home Layout: One level     Bathroom Shower/Tub: Occupational psychologist: Handicapped height Bathroom Accessibility: Yes How Accessible: Accessible via walker Home Equipment: Benedict - 2 wheels;Wheelchair - manual;Shower seat - built in;Toilet riser          Prior Functioning/Environment Level of Independence: Independent with assistive device(s)        Comments: reports not using a device around the home but using rw/wheelchair outside    OT Diagnosis: Acute pain;Generalized weakness   OT Problem List:     OT Treatment/Interventions:      OT Goals(Current goals can be found in the care plan section) Acute Rehab OT Goals OT Goal Formulation: All assessment and education complete, DC therapy  OT Frequency:     Barriers to D/C:            Co-evaluation              End of Session Equipment Utilized During Treatment: Gait belt;Rolling walker;Right knee immobilizer CPM Right Knee CPM Right Knee: Off Nurse Communication: Other (comment) (pts IV coming out )  Activity Tolerance: Patient tolerated treatment well Patient left: in chair;with call bell/phone within reach;with family/visitor present   Time: 0922-0943 OT Time Calculation (min): 21 min Charges:  OT General Charges $OT Visit: 1 Procedure OT Evaluation $OT Eval Moderate Complexity: 1 Procedure G-Codes:     Binnie Kand M.S., OTR/L Pager: [phone]  03/18/2015, 9:55 AM

## 2015-03-19 DIAGNOSIS — M1990 Unspecified osteoarthritis, unspecified site: Secondary | ICD-10-CM | POA: Diagnosis not present

## 2015-03-19 DIAGNOSIS — I10 Essential (primary) hypertension: Secondary | ICD-10-CM | POA: Diagnosis not present

## 2015-03-19 DIAGNOSIS — M797 Fibromyalgia: Secondary | ICD-10-CM | POA: Diagnosis not present

## 2015-03-19 DIAGNOSIS — G8929 Other chronic pain: Secondary | ICD-10-CM | POA: Diagnosis not present

## 2015-03-19 DIAGNOSIS — Z471 Aftercare following joint replacement surgery: Secondary | ICD-10-CM | POA: Diagnosis not present

## 2015-03-19 DIAGNOSIS — G309 Alzheimer's disease, unspecified: Secondary | ICD-10-CM | POA: Diagnosis not present

## 2015-03-19 NOTE — Discharge Summary (Signed)
Physician Discharge Summary  Patient ID: Jacqueline Lucas MRN: [id number] DOB/AGE: 1951/02/10 65 y.o.  Admit date: 03/16/2015 Discharge date: 03/18/2015 Admission Diagnoses:  Active Problems:   Degenerative arthritis of right knee   Discharge Diagnoses:  Same  Surgeries: Procedure(s): RIGHT TOTAL KNEE ARTHROPLASTY, PCL SACRIFICING on 03/16/2015   Consultants:    Discharged Condition: Stable  Hospital Course: Jacqueline Lucas is an 65 y.o. female who was admitted 03/16/2015 with a chief complaint of right knee arthritis, and found to have a diagnosis of right knee pain and arthritis.  They were brought to the operating room on 03/16/2015 and underwent the above named procedures.  She did well with PT and pain control and was dced home with supportive husband in good condition wbat with HHPT and CPM  Antibiotics given:  Anti-infectives    Start     Dose/Rate Route Frequency Ordered Stop   03/16/15 2000  ceFAZolin (ANCEF) IVPB 2 g/50 mL premix     2 g 100 mL/hr over 30 Minutes Intravenous Every 6 hours 03/16/15 1938 03/17/15 0436   03/16/15 1030  ceFAZolin (ANCEF) IVPB 2 g/50 mL premix     2 g 100 mL/hr over 30 Minutes Intravenous To [facility] 03/15/15 1334 03/16/15 1115    .  Recent vital signs:  Filed Vitals:   03/18/15 0631 03/18/15 0855  BP: 132/58   Pulse: 82 83  Temp: 98.9 F (37.2 C)   Resp: 18 20    Recent laboratory studies:  Results for orders placed or performed during the hospital encounter of 03/16/15  Protime-INR  Result Value Ref Range   Prothrombin Time 15.2 11.6 - 15.2 seconds   INR 1.18 0.00 - 99991111  Basic metabolic panel  Result Value Ref Range   Sodium 137 135 - 145 mmol/L   Potassium 3.6 3.5 - 5.1 mmol/L   Chloride 103 101 - 111 mmol/L   CO2 28 22 - 32 mmol/L   Glucose, Bld 130 (H) 65 - 99 mg/dL   BUN 10 6 - 20 mg/dL   Creatinine, Ser 0.91 0.44 - 1.00 mg/dL   Calcium 8.3 (L) 8.9 - 10.3 mg/dL   GFR calc non Af Amer >60 >60 mL/min    GFR calc Af Amer >60 >60 mL/min   Anion gap 6 5 - 15  Protime-INR  Result Value Ref Range   Prothrombin Time 14.8 11.6 - 15.2 seconds   INR 1.14 0.00 - 1.49  CBC  Result Value Ref Range   WBC 13.1 (H) 4.0 - 10.5 K/uL   RBC 4.10 3.87 - 5.11 MIL/uL   Hemoglobin 11.2 (L) 12.0 - 15.0 g/dL   HCT 35.5 (L) 36.0 - 46.0 %   MCV 86.6 78.0 - 100.0 fL   MCH 27.3 26.0 - 34.0 pg   MCHC 31.5 30.0 - 36.0 g/dL   RDW 15.0 11.5 - 15.5 %   Platelets 255 150 - 400 K/uL  CBC  Result Value Ref Range   WBC 11.2 (H) 4.0 - 10.5 K/uL   RBC 3.69 (L) 3.87 - 5.11 MIL/uL   Hemoglobin 10.3 (L) 12.0 - 15.0 g/dL   HCT 32.4 (L) 36.0 - 46.0 %   MCV 87.8 78.0 - 100.0 fL   MCH 27.9 26.0 - 34.0 pg   MCHC 31.8 30.0 - 36.0 g/dL   RDW 15.0 11.5 - 15.5 %   Platelets 247 150 - 400 K/uL  Protime-INR  Result Value Ref Range   Prothrombin Time 19.2 (H) 11.6 -  15.2 seconds   INR 1.61 (H) 0.00 - 1.49  CBC  Result Value Ref Range   WBC 8.8 4.0 - 10.5 K/uL   RBC 3.57 (L) 3.87 - 5.11 MIL/uL   Hemoglobin 10.0 (L) 12.0 - 15.0 g/dL   HCT 31.4 (L) 36.0 - 46.0 %   MCV 88.0 78.0 - 100.0 fL   MCH 28.0 26.0 - 34.0 pg   MCHC 31.8 30.0 - 36.0 g/dL   RDW 15.2 11.5 - 15.5 %   Platelets 215 150 - 400 K/uL    Discharge Medications:     Medication List    STOP taking these medications        aspirin EC 81 MG tablet     diclofenac 75 MG EC tablet  Commonly known as:  VOLTAREN     diclofenac sodium 1 % Gel  Commonly known as:  VOLTAREN     oxyCODONE-acetaminophen 7.5-325 MG tablet  Commonly known as:  PERCOCET      TAKE these medications        albuterol 108 (90 Base) MCG/ACT inhaler  Commonly known as:  PROAIR HFA  Inhale 2 puffs into the lungs every 6 (six) hours as needed for wheezing or shortness of breath.     atorvastatin 40 MG tablet  Commonly known as:  LIPITOR  Take 1 tablet (40 mg total) by mouth at bedtime.     baclofen 10 MG tablet  Commonly known as:  LIORESAL  Take 10 mg by mouth 2 (two) times  daily as needed.     clonazePAM 0.5 MG tablet  Commonly known as:  KLONOPIN  Take 1 tablet (0.5 mg total) by mouth 2 (two) times daily.     cyclobenzaprine 10 MG tablet  Commonly known as:  FLEXERIL  Take 10 mg by mouth 3 (three) times daily as needed for muscle spasms.     cycloSPORINE 0.05 % ophthalmic emulsion  Commonly known as:  RESTASIS  Apply 1 drop to eye 2 (two) times daily.     D3-1000 1000 units tablet  Generic drug:  Cholecalciferol  Take 1,000 Units by mouth daily.     estradiol 0.1 MG/GM vaginal cream  Commonly known as:  ESTRACE  Apply 0.5mg  (pea-sized amount)  just inside the vaginal introitus with a finger-tip every night for two weeks and then Monday, Wednesday and Friday nights.  adverse systemic effects that oral HT.     fenofibrate 145 MG tablet  Commonly known as:  TRICOR  Take 1 tablet (145 mg total) by mouth daily.     fluticasone 50 MCG/ACT nasal spray  Commonly known as:  FLONASE  Place 1 spray into both nostrils daily as needed for allergies.     gabapentin 300 MG capsule  Commonly known as:  NEURONTIN  Take 900 mg by mouth 3 (three) times daily.     lansoprazole 30 MG capsule  Commonly known as:  PREVACID  Take 1 capsule (30 mg total) by mouth 2 (two) times daily before a meal.     levalbuterol 1.25 MG/3ML nebulizer solution  Commonly known as:  XOPENEX  Take 1.25 mg by nebulization every 4 (four) hours as needed for wheezing.     magnesium oxide 400 MG tablet  Commonly known as:  MAG-OX  Take 400 mg by mouth daily.     mometasone-formoterol 200-5 MCG/ACT Aero  Commonly known as:  DULERA  Inhale 2 puffs into the lungs daily.     montelukast 10 MG tablet  Commonly known as:  SINGULAIR  Take 1 tablet (10 mg total) by mouth at bedtime.     ondansetron 4 MG tablet  Commonly known as:  ZOFRAN  Take 1 tablet (4 mg total) by mouth every 8 (eight) hours as needed for nausea or vomiting.     oxyCODONE 5 MG immediate release tablet   Commonly known as:  Oxy IR/ROXICODONE  Take 1-2 tablets (5-10 mg total) by mouth every 3 (three) hours as needed for breakthrough pain.     Polyethyl Glycol-Propyl Glycol 0.4-0.3 % Soln  Apply 3 drops to eye as needed.     SYNTHROID 88 MCG tablet  Generic drug:  levothyroxine  Take 88 mcg by mouth daily before breakfast.     valsartan-hydrochlorothiazide 160-12.5 MG tablet  Commonly known as:  DIOVAN-HCT  TAKE 1 TABLET BY MOUTH DAILY.     venlafaxine XR 75 MG 24 hr capsule  Commonly known as:  EFFEXOR-XR  Take 75 mg by mouth daily with breakfast.     vitamin B-12 1000 MCG tablet  Commonly known as:  CYANOCOBALAMIN  Take 1,000 mcg by mouth daily.     warfarin 5 MG tablet  Commonly known as:  COUMADIN  Take 1 tablet (5 mg total) by mouth daily.        Diagnostic Studies: Dg Chest 2 View  03/12/2015  CLINICAL DATA:  Preoperative total knee replacement.  Hypertension. EXAM: CHEST  2 VIEW COMPARISON:  November 03, 2012 FINDINGS: There is no edema or consolidation. Heart size and pulmonary vascularity are normal. No adenopathy. No bone lesions. There is postoperative change in the lumbar region. IMPRESSION: No edema or consolidation. Electronically Signed   By: Lowella Grip III M.D.   On: 03/12/2015 17:15    Disposition: 01-Home or Self Care      Discharge Instructions    Call MD / Call 911    Complete by:  As directed   If you experience chest pain or shortness of breath, CALL 911 and be transported to the hospital emergency room.  If you develope a fever above 101 F, pus (white drainage) or increased drainage or redness at the wound, or calf pain, call your surgeon's office.     Constipation Prevention    Complete by:  As directed   Drink plenty of fluids.  Prune juice may be helpful.  You may use a stool softener, such as Colace (over the counter) 100 mg twice a day.  Use MiraLax (over the counter) for constipation as needed.     Diet - low sodium heart healthy     Complete by:  As directed      Discharge instructions    Complete by:  As directed   Keep incision dry Weightbearing as tolerated with walker CPM machine 6 hours per day INSTRUCTIONS AFTER JOINT REPLACEMENT   Remove items at home which could result in a fall. This includes throw rugs or furniture in walking pathways ICE to the affected joint every three hours while awake for 30 minutes at a time, for at least the first 3-5 days, and then as needed for pain and swelling.  Continue to use ice for pain and swelling. You may notice swelling that will progress down to the foot and ankle.  This is normal after surgery.  Elevate your leg when you are not up walking on it.   Continue to use the breathing machine you got in the hospital (incentive spirometer) which will help keep your temperature  down.  It is common for your temperature to cycle up and down following surgery, especially at night when you are not up moving around and exerting yourself.  The breathing machine keeps your lungs expanded and your temperature down.   DIET:  As you were doing prior to hospitalization, we recommend a well-balanced diet.  DRESSING / WOUND CARE / SHOWERING  You may shower 3 days after surgery, but keep the wounds dry during showering.  You may use an occlusive plastic wrap (Press'n Seal for example), NO SOAKING/SUBMERGING IN THE BATHTUB.  If the bandage gets wet, change with a clean dry gauze.  If the incision gets wet, pat the wound dry with a clean towel.  ACTIVITY  Increase activity slowly as tolerated, but follow the weight bearing instructions below.   No driving for 6 weeks or until further direction given by your physician.  You cannot drive while taking narcotics.  No lifting or carrying greater than 10 lbs. until further directed by your surgeon. Avoid periods of inactivity such as sitting longer than an hour when not asleep. This helps prevent blood clots.  You may return to work once you are  authorized by your doctor.     WEIGHT BEARING   Weight bearing as tolerated with assist device (walker, cane, etc) as directed, use it as long as suggested by your surgeon or therapist, typically at least 4-6 weeks.   EXERCISES  Results after joint replacement surgery are often greatly improved when you follow the exercise, range of motion and muscle strengthening exercises prescribed by your doctor. Safety measures are also important to protect the joint from further injury. Any time any of these exercises cause you to have increased pain or swelling, decrease what you are doing until you are comfortable again and then slowly increase them. If you have problems or questions, call your caregiver or physical therapist for advice.   Rehabilitation is important following a joint replacement. After just a few days of immobilization, the muscles of the leg can become weakened and shrink (atrophy).  These exercises are designed to build up the tone and strength of the thigh and leg muscles and to improve motion. Often times heat used for twenty to thirty minutes before working out will loosen up your tissues and help with improving the range of motion but do not use heat for the first two weeks following surgery (sometimes heat can increase post-operative swelling).   These exercises can be done on a training (exercise) mat, on the floor, on a table or on a bed. Use whatever works the best and is most comfortable for you.    Use music or television while you are exercising so that the exercises are a pleasant break in your day. This will make your life better with the exercises acting as a break in your routine that you can look forward to.   Perform all exercises about fifteen times, three times per day or as directed.  You should exercise both the operative leg and the other leg as well.   Exercises include:   Quad Sets - Tighten up the muscle on the front of the thigh (Quad) and hold for 5-10  seconds.   Straight Leg Raises - With your knee straight (if you were given a brace, keep it on), lift the leg to 60 degrees, hold for 3 seconds, and slowly lower the leg.  Perform this exercise against resistance later as your leg gets stronger.  Leg Slides: Lying  on your back, slowly slide your foot toward your buttocks, bending your knee up off the floor (only go as far as is comfortable). Then slowly slide your foot back down until your leg is flat on the floor again.  Angel Wings: Lying on your back spread your legs to the side as far apart as you can without causing discomfort.  Hamstring Strength:  Lying on your back, push your heel against the floor with your leg straight by tightening up the muscles of your buttocks.  Repeat, but this time bend your knee to a comfortable angle, and push your heel against the floor.  You may put a pillow under the heel to make it more comfortable if necessary.   A rehabilitation program following joint replacement surgery can speed recovery and prevent re-injury in the future due to weakened muscles. Contact your doctor or a physical therapist for more information on knee rehabilitation.    CONSTIPATION  Constipation is defined medically as fewer than three stools per week and severe constipation as less than one stool per week.  Even if you have a regular bowel pattern at home, your normal regimen is likely to be disrupted due to multiple reasons following surgery.  Combination of anesthesia, postoperative narcotics, change in appetite and fluid intake all can affect your bowels.   YOU MUST use at least one of the following options; they are listed in order of increasing strength to get the job done.  They are all available over the counter, and you may need to use some, POSSIBLY even all of these options:    Drink plenty of fluids (prune juice may be helpful) and high fiber foods Colace 100 mg by mouth twice a day  Senokot for constipation as directed and  as needed Dulcolax (bisacodyl), take with full glass of water  Miralax (polyethylene glycol) once or twice a day as needed.  If you have tried all these things and are unable to have a bowel movement in the first 3-4 days after surgery call either your surgeon or your primary doctor.    If you experience loose stools or diarrhea, hold the medications until you stool forms back up.  If your symptoms do not get better within 1 week or if they get worse, check with your doctor.  If you experience "the worst abdominal pain ever" or develop nausea or vomiting, please contact the office immediately for further recommendations for treatment.   ITCHING:  If you experience itching with your medications, try taking only a single pain pill, or even half a pain pill at a time.  You can also use Benadryl over the counter for itching or also to help with sleep.   TED HOSE STOCKINGS:  Use stockings on both legs until for at least 2 weeks or as directed by physician office. They may be removed at night for sleeping.  MEDICATIONS:  See your medication summary on the "After Visit Summary" that nursing will review with you.  You may have some home medications which will be placed on hold until you complete the course of blood thinner medication.  It is important for you to complete the blood thinner medication as prescribed.  PRECAUTIONS:  If you experience chest pain or shortness of breath - call 911 immediately for transfer to the hospital emergency department.   If you develop a fever greater that 101 F, purulent drainage from wound, increased redness or drainage from wound, foul odor from the wound/dressing, or calf pain -  CONTACT YOUR SURGEON.                                                   FOLLOW-UP APPOINTMENTS:  If you do not already have a post-op appointment, please call the office for an appointment to be seen by your surgeon.  Guidelines for how soon to be seen are listed in your "After Visit Summary",  but are typically between 1-4 weeks after surgery.  OTHER INSTRUCTIONS:   Knee Replacement:  Do not place pillow under knee, focus on keeping the knee straight while resting. CPM instructions: 0-90 degrees, 2 hours in the morning, 2 hours in the afternoon, and 2 hours in the evening. Place foam block, curve side up under heel at all times except when in CPM or when walking.  DO NOT modify, tear, cut, or change the foam block in any way.  MAKE SURE YOU:  Understand these instructions.  Get help right away if you are not doing well or get worse.    Thank you for letting us be a part of your medical care team.  It is a privilege we respect greatly.  We hope these instructions will help you stay on track for a fast and full recovery!     Full weight bearing    Complete by:  As directed      Increase activity slowly as tolerated    Complete by:  As directed               Signed: Cay Kath SCOTT 03/19/2015, 12:57 PM

## 2015-03-22 DIAGNOSIS — G8929 Other chronic pain: Secondary | ICD-10-CM | POA: Diagnosis not present

## 2015-03-22 DIAGNOSIS — I10 Essential (primary) hypertension: Secondary | ICD-10-CM | POA: Diagnosis not present

## 2015-03-22 DIAGNOSIS — Z471 Aftercare following joint replacement surgery: Secondary | ICD-10-CM | POA: Diagnosis not present

## 2015-03-22 DIAGNOSIS — G309 Alzheimer's disease, unspecified: Secondary | ICD-10-CM | POA: Diagnosis not present

## 2015-03-22 DIAGNOSIS — M1990 Unspecified osteoarthritis, unspecified site: Secondary | ICD-10-CM | POA: Diagnosis not present

## 2015-03-22 DIAGNOSIS — M797 Fibromyalgia: Secondary | ICD-10-CM | POA: Diagnosis not present

## 2015-03-24 DIAGNOSIS — I10 Essential (primary) hypertension: Secondary | ICD-10-CM | POA: Diagnosis not present

## 2015-03-24 DIAGNOSIS — M797 Fibromyalgia: Secondary | ICD-10-CM | POA: Diagnosis not present

## 2015-03-24 DIAGNOSIS — M1990 Unspecified osteoarthritis, unspecified site: Secondary | ICD-10-CM | POA: Diagnosis not present

## 2015-03-24 DIAGNOSIS — G8929 Other chronic pain: Secondary | ICD-10-CM | POA: Diagnosis not present

## 2015-03-24 DIAGNOSIS — Z471 Aftercare following joint replacement surgery: Secondary | ICD-10-CM | POA: Diagnosis not present

## 2015-03-24 DIAGNOSIS — G309 Alzheimer's disease, unspecified: Secondary | ICD-10-CM | POA: Diagnosis not present

## 2015-03-25 DIAGNOSIS — M797 Fibromyalgia: Secondary | ICD-10-CM | POA: Diagnosis not present

## 2015-03-25 DIAGNOSIS — I10 Essential (primary) hypertension: Secondary | ICD-10-CM | POA: Diagnosis not present

## 2015-03-25 DIAGNOSIS — Z471 Aftercare following joint replacement surgery: Secondary | ICD-10-CM | POA: Diagnosis not present

## 2015-03-25 DIAGNOSIS — M1990 Unspecified osteoarthritis, unspecified site: Secondary | ICD-10-CM | POA: Diagnosis not present

## 2015-03-25 DIAGNOSIS — G8929 Other chronic pain: Secondary | ICD-10-CM | POA: Diagnosis not present

## 2015-03-25 DIAGNOSIS — G309 Alzheimer's disease, unspecified: Secondary | ICD-10-CM | POA: Diagnosis not present

## 2015-03-26 DIAGNOSIS — M797 Fibromyalgia: Secondary | ICD-10-CM | POA: Diagnosis not present

## 2015-03-26 DIAGNOSIS — G309 Alzheimer's disease, unspecified: Secondary | ICD-10-CM | POA: Diagnosis not present

## 2015-03-26 DIAGNOSIS — Z471 Aftercare following joint replacement surgery: Secondary | ICD-10-CM | POA: Diagnosis not present

## 2015-03-26 DIAGNOSIS — M1990 Unspecified osteoarthritis, unspecified site: Secondary | ICD-10-CM | POA: Diagnosis not present

## 2015-03-26 DIAGNOSIS — G8929 Other chronic pain: Secondary | ICD-10-CM | POA: Diagnosis not present

## 2015-03-26 DIAGNOSIS — I10 Essential (primary) hypertension: Secondary | ICD-10-CM | POA: Diagnosis not present

## 2015-03-27 ENCOUNTER — Other Ambulatory Visit: Payer: Self-pay | Admitting: Family Medicine

## 2015-03-29 ENCOUNTER — Other Ambulatory Visit: Payer: Self-pay | Admitting: Family Medicine

## 2015-03-29 DIAGNOSIS — G309 Alzheimer's disease, unspecified: Secondary | ICD-10-CM | POA: Diagnosis not present

## 2015-03-29 DIAGNOSIS — I10 Essential (primary) hypertension: Secondary | ICD-10-CM | POA: Diagnosis not present

## 2015-03-29 DIAGNOSIS — G8929 Other chronic pain: Secondary | ICD-10-CM | POA: Diagnosis not present

## 2015-03-29 DIAGNOSIS — Z471 Aftercare following joint replacement surgery: Secondary | ICD-10-CM | POA: Diagnosis not present

## 2015-03-29 DIAGNOSIS — M1990 Unspecified osteoarthritis, unspecified site: Secondary | ICD-10-CM | POA: Diagnosis not present

## 2015-03-29 DIAGNOSIS — M797 Fibromyalgia: Secondary | ICD-10-CM | POA: Diagnosis not present

## 2015-03-31 DIAGNOSIS — Z96651 Presence of right artificial knee joint: Secondary | ICD-10-CM | POA: Diagnosis not present

## 2015-04-01 DIAGNOSIS — G8929 Other chronic pain: Secondary | ICD-10-CM | POA: Diagnosis not present

## 2015-04-01 DIAGNOSIS — M1990 Unspecified osteoarthritis, unspecified site: Secondary | ICD-10-CM | POA: Diagnosis not present

## 2015-04-01 DIAGNOSIS — Z471 Aftercare following joint replacement surgery: Secondary | ICD-10-CM | POA: Diagnosis not present

## 2015-04-01 DIAGNOSIS — I10 Essential (primary) hypertension: Secondary | ICD-10-CM | POA: Diagnosis not present

## 2015-04-01 DIAGNOSIS — G309 Alzheimer's disease, unspecified: Secondary | ICD-10-CM | POA: Diagnosis not present

## 2015-04-01 DIAGNOSIS — M797 Fibromyalgia: Secondary | ICD-10-CM | POA: Diagnosis not present

## 2015-04-06 DIAGNOSIS — G309 Alzheimer's disease, unspecified: Secondary | ICD-10-CM | POA: Diagnosis not present

## 2015-04-06 DIAGNOSIS — Z471 Aftercare following joint replacement surgery: Secondary | ICD-10-CM | POA: Diagnosis not present

## 2015-04-06 DIAGNOSIS — M797 Fibromyalgia: Secondary | ICD-10-CM | POA: Diagnosis not present

## 2015-04-06 DIAGNOSIS — I10 Essential (primary) hypertension: Secondary | ICD-10-CM | POA: Diagnosis not present

## 2015-04-06 DIAGNOSIS — M1990 Unspecified osteoarthritis, unspecified site: Secondary | ICD-10-CM | POA: Diagnosis not present

## 2015-04-06 DIAGNOSIS — G8929 Other chronic pain: Secondary | ICD-10-CM | POA: Diagnosis not present

## 2015-04-07 DIAGNOSIS — Z471 Aftercare following joint replacement surgery: Secondary | ICD-10-CM | POA: Diagnosis not present

## 2015-04-07 DIAGNOSIS — I10 Essential (primary) hypertension: Secondary | ICD-10-CM | POA: Diagnosis not present

## 2015-04-07 DIAGNOSIS — M797 Fibromyalgia: Secondary | ICD-10-CM | POA: Diagnosis not present

## 2015-04-07 DIAGNOSIS — M1990 Unspecified osteoarthritis, unspecified site: Secondary | ICD-10-CM | POA: Diagnosis not present

## 2015-04-07 DIAGNOSIS — G8929 Other chronic pain: Secondary | ICD-10-CM | POA: Diagnosis not present

## 2015-04-07 DIAGNOSIS — G309 Alzheimer's disease, unspecified: Secondary | ICD-10-CM | POA: Diagnosis not present

## 2015-04-09 DIAGNOSIS — G309 Alzheimer's disease, unspecified: Secondary | ICD-10-CM | POA: Diagnosis not present

## 2015-04-09 DIAGNOSIS — M797 Fibromyalgia: Secondary | ICD-10-CM | POA: Diagnosis not present

## 2015-04-09 DIAGNOSIS — G8929 Other chronic pain: Secondary | ICD-10-CM | POA: Diagnosis not present

## 2015-04-09 DIAGNOSIS — Z471 Aftercare following joint replacement surgery: Secondary | ICD-10-CM | POA: Diagnosis not present

## 2015-04-09 DIAGNOSIS — I10 Essential (primary) hypertension: Secondary | ICD-10-CM | POA: Diagnosis not present

## 2015-04-09 DIAGNOSIS — M1990 Unspecified osteoarthritis, unspecified site: Secondary | ICD-10-CM | POA: Diagnosis not present

## 2015-04-13 ENCOUNTER — Other Ambulatory Visit: Payer: Self-pay

## 2015-04-13 MED ORDER — OXYCODONE HCL 5 MG PO TABS: 5.0000 mg | ORAL_TABLET | ORAL | Status: DC | PRN

## 2015-04-26 ENCOUNTER — Other Ambulatory Visit: Payer: Self-pay

## 2015-04-26 DIAGNOSIS — F419 Anxiety disorder, unspecified: Secondary | ICD-10-CM

## 2015-04-27 ENCOUNTER — Ambulatory Visit: Payer: Medicare Other | Admitting: Urology

## 2015-04-27 ENCOUNTER — Other Ambulatory Visit: Payer: Self-pay | Admitting: Family Medicine

## 2015-04-27 MED ORDER — CLONAZEPAM 0.5 MG PO TABS
0.5000 mg | ORAL_TABLET | Freq: Two times a day (BID) | ORAL | Status: DC
Start: 2015-04-27 — End: 2015-05-28

## 2015-04-27 NOTE — Progress Notes (Signed)
Refilled for Dr. Ashok Norris as he is out of office.

## 2015-05-05 DIAGNOSIS — Z96651 Presence of right artificial knee joint: Secondary | ICD-10-CM | POA: Diagnosis not present

## 2015-05-17 ENCOUNTER — Other Ambulatory Visit: Payer: Self-pay

## 2015-05-17 MED ORDER — OXYCODONE HCL 5 MG PO TABS: 5.0000 mg | ORAL_TABLET | ORAL | Status: DC | PRN

## 2015-05-18 ENCOUNTER — Ambulatory Visit: Payer: Medicare Other | Admitting: Family Medicine

## 2015-05-24 DIAGNOSIS — N390 Urinary tract infection, site not specified: Secondary | ICD-10-CM | POA: Diagnosis not present

## 2015-05-24 DIAGNOSIS — N399 Disorder of urinary system, unspecified: Secondary | ICD-10-CM | POA: Diagnosis not present

## 2015-05-27 ENCOUNTER — Ambulatory Visit: Payer: Medicare Other | Admitting: Urology

## 2015-05-28 ENCOUNTER — Other Ambulatory Visit: Payer: Self-pay | Admitting: Family Medicine

## 2015-05-28 DIAGNOSIS — F419 Anxiety disorder, unspecified: Secondary | ICD-10-CM

## 2015-05-28 MED ORDER — CLONAZEPAM 0.5 MG PO TABS: 0.5000 mg | ORAL_TABLET | Freq: Two times a day (BID) | ORAL | Status: DC

## 2015-05-28 NOTE — Telephone Encounter (Signed)
Pt needs refill on Clonazepam [facility]. Pt is completely out.

## 2015-05-28 NOTE — Telephone Encounter (Signed)
Refill request was sent to Dr. Krichna Sowles for approval and submission.  

## 2015-05-31 ENCOUNTER — Other Ambulatory Visit: Payer: Self-pay

## 2015-05-31 DIAGNOSIS — F419 Anxiety disorder, unspecified: Secondary | ICD-10-CM

## 2015-05-31 NOTE — Telephone Encounter (Signed)
Already addressed by Dr. Ancil Boozer on 05/28/15 per chart; I'm denying this request

## 2015-06-01 ENCOUNTER — Other Ambulatory Visit: Payer: Self-pay

## 2015-06-01 DIAGNOSIS — F419 Anxiety disorder, unspecified: Secondary | ICD-10-CM

## 2015-06-01 NOTE — Telephone Encounter (Signed)
Dr. Ancil Boozer already addressed this; see 05/28/15 refill

## 2015-06-11 DIAGNOSIS — R300 Dysuria: Secondary | ICD-10-CM | POA: Diagnosis not present

## 2015-06-16 ENCOUNTER — Other Ambulatory Visit: Payer: Self-pay | Admitting: Emergency Medicine

## 2015-06-16 MED ORDER — VALSARTAN-HYDROCHLOROTHIAZIDE 160-12.5 MG PO TABS: 1.0000 | ORAL_TABLET | Freq: Every day | ORAL | Status: DC

## 2015-07-01 ENCOUNTER — Ambulatory Visit: Payer: Medicare Other | Admitting: Family Medicine

## 2015-07-01 DIAGNOSIS — R300 Dysuria: Secondary | ICD-10-CM | POA: Diagnosis not present

## 2015-07-01 DIAGNOSIS — N390 Urinary tract infection, site not specified: Secondary | ICD-10-CM | POA: Diagnosis not present

## 2015-07-02 ENCOUNTER — Telehealth: Payer: Self-pay

## 2015-07-02 NOTE — Telephone Encounter (Signed)
Pt called stating she has had 3 UTIs in the past month. Requested pt bring all records with her from previous providers. Pt stated that she saw Urgent care in [place], [facility], and [place] [facility]. Requested pt bring records from [facility] and [place]. Pt voiced understanding.

## 2015-07-06 ENCOUNTER — Ambulatory Visit (INDEPENDENT_AMBULATORY_CARE_PROVIDER_SITE_OTHER): Payer: Medicare Other | Admitting: Urology

## 2015-07-06 ENCOUNTER — Encounter: Payer: Self-pay | Admitting: Urology

## 2015-07-06 VITALS — BP 133/79 | HR 87 | Ht 64.0 in | Wt 204.7 lb

## 2015-07-06 DIAGNOSIS — R310 Gross hematuria: Secondary | ICD-10-CM | POA: Diagnosis not present

## 2015-07-06 DIAGNOSIS — N390 Urinary tract infection, site not specified: Secondary | ICD-10-CM | POA: Diagnosis not present

## 2015-07-06 DIAGNOSIS — I209 Angina pectoris, unspecified: Secondary | ICD-10-CM | POA: Diagnosis not present

## 2015-07-06 DIAGNOSIS — N952 Postmenopausal atrophic vaginitis: Secondary | ICD-10-CM | POA: Diagnosis not present

## 2015-07-06 LAB — URINALYSIS, COMPLETE
Bilirubin, UA: NEGATIVE
Glucose, UA: NEGATIVE
Ketones, UA: NEGATIVE
Leukocytes, UA: NEGATIVE
Nitrite, UA: NEGATIVE
Protein, UA: NEGATIVE
RBC, UA: NEGATIVE
Specific Gravity, UA: 1.025 (ref 1.005–1.030)
Urobilinogen, Ur: 0.2 mg/dL (ref 0.2–1.0)
pH, UA: 5.5 (ref 5.0–7.5)

## 2015-07-06 LAB — MICROSCOPIC EXAMINATION
Bacteria, UA: NONE SEEN
WBC, UA: NONE SEEN /hpf (ref 0–?)

## 2015-07-06 NOTE — Progress Notes (Signed)
07/06/2015 11:27 AM   Jacqueline Lucas 30-Nov-1950 [id number]  Referring provider: Ashok Norris, MD [address] [place], [place] [address]  Chief Complaint  Patient presents with  . Recurrent UTI    patient state three uti in last month   . Hematuria    HPI: Patient is a 65 year old Caucasian female who presents today stating that she has had 3 urinary tract infections over the last month associated with gross hematuria and blood clots.    She had a positive urine culture on 06/11/2015 for Escherichia coli and a positive urine culture on 07/01/2015 for Escherichia coli.  She states her symptoms of urinary tract infections is gross hematuria.  She does have some urinary hesitancy and straining to urinate.  She does not experience dysuria or suprapubic pain.  UA today is unremarkable.  She underwent a hematuria work up in 09/2013 with a CT Urogram and cystoscopy and no GU pathology was discovered.    She does have vaginal atrophy and is using vaginal estrogen cream 3 nights weekly.    PMH: Past Medical History  Diagnosis Date  . Hypertension   . Asthma   . Hyperlipidemia   . Thyroid disease   . Bronchitis   . Lumbar neuritis   . Shoulder fracture, right   . Anxiety   . Arthritis   . Allergy   . Cataract   . Depression   . GERD (gastroesophageal reflux disease)   . Cancer of both ovaries ([place])   . Insomnia   . RLS (restless legs syndrome)   . Migraines   . Chronic nausea   . Alzheimer disease   . Obesity   . Tachycardia   . Throat cancer ([place])   . Anginal pain ([place])   . Palpitation   . Fibromyalgia   . Heart murmur   . Hematuria   . Ovarian cancer ([place])   . IBS (irritable bowel syndrome)   . PONV (postoperative nausea and vomiting)   . Claustrophobia   . H/O bladder infections   . Allergy to adhesive tape     Allergy to paper tape as well  . Dry eyes     uses Restasis    Surgical History: Past Surgical History  Procedure  Laterality Date  . Replacement total knee Left   . Total hip arthroplasty Left 2013  . Hemorrhoid surgery    . Abdominal hysterectomy      due to cancer of ovaries  . Spine surgery      x 2  . Tendonititis      right elbow, right wrist  . Back surgery N/A 2014    x 5  . Colonoscopy w/ polypectomy    . Cardiac catheterization      no PCI; 12/18/12 ([facility], Dr. Marcello Moores): EP study with ablation. No coronary angiography done then.  . Total knee arthroplasty Right 03/16/2015    Procedure: RIGHT TOTAL KNEE ARTHROPLASTY, PCL SACRIFICING;  Surgeon: Meredith Pel, MD;  Location: [facility];  Service: Orthopedics;  Laterality: Right;    Home Medications:    Medication List       This list is accurate as of: 07/06/15 11:27 AM.  Always use your most recent med list.               albuterol 108 (90 Base) MCG/ACT inhaler  Commonly known as:  PROAIR HFA  Inhale 2 puffs into the lungs every 6 (six) hours as needed for wheezing or  shortness of breath.     amoxicillin 500 MG capsule  Commonly known as:  AMOXIL  Reported on 07/06/2015     atorvastatin 40 MG tablet  Commonly known as:  LIPITOR  Take 1 tablet (40 mg total) by mouth at bedtime.     baclofen 10 MG tablet  Commonly known as:  LIORESAL  Take 10 mg by mouth 2 (two) times daily as needed.     clonazePAM 0.5 MG tablet  Commonly known as:  KLONOPIN  Take 1 tablet (0.5 mg total) by mouth 2 (two) times daily.     cyclobenzaprine 10 MG tablet  Commonly known as:  FLEXERIL  Take 10 mg by mouth 3 (three) times daily as needed for muscle spasms. Reported on 07/06/2015     cycloSPORINE 0.05 % ophthalmic emulsion  Commonly known as:  RESTASIS  Apply 1 drop to eye 2 (two) times daily.     D3-1000 1000 units tablet  Generic drug:  Cholecalciferol  Take 1,000 Units by mouth daily.     estradiol 0.1 MG/GM vaginal cream  Commonly known as:  ESTRACE  Apply 0.5mg  (pea-sized amount)  just inside the vaginal introitus with a finger-tip every  night for two weeks and then Monday, Wednesday and Friday nights.  adverse systemic effects that oral HT.     fenofibrate 145 MG tablet  Commonly known as:  TRICOR  Take 1 tablet (145 mg total) by mouth daily.     fluticasone 50 MCG/ACT nasal spray  Commonly known as:  FLONASE  Place 1 spray into both nostrils daily as needed for allergies.     gabapentin 300 MG capsule  Commonly known as:  NEURONTIN  Take 900 mg by mouth 3 (three) times daily.     HYDROcodone-acetaminophen 5-325 MG tablet  Commonly known as:  NORCO/VICODIN     lansoprazole 30 MG capsule  Commonly known as:  PREVACID  Take 1 capsule (30 mg total) by mouth 2 (two) times daily before a meal.     levalbuterol 1.25 MG/3ML nebulizer solution  Commonly known as:  XOPENEX  Take 1.25 mg by nebulization every 4 (four) hours as needed for wheezing.     magnesium oxide 400 MG tablet  Commonly known as:  MAG-OX  Take 400 mg by mouth daily.     mometasone-formoterol 200-5 MCG/ACT Aero  Commonly known as:  DULERA  Inhale 2 puffs into the lungs daily. Reported on 07/06/2015     montelukast 10 MG tablet  Commonly known as:  SINGULAIR  Take 1 tablet (10 mg total) by mouth at bedtime.     nitrofurantoin (macrocrystal-monohydrate) 100 MG capsule  Commonly known as:  MACROBID  TAKE 1 CAPSULE (100 MG TOTAL) BY MOUTH 2 (TWO) TIMES DAILY FOR 7 DAYS.     ondansetron 4 MG tablet  Commonly known as:  ZOFRAN  Take 1 tablet (4 mg total) by mouth every 8 (eight) hours as needed for nausea or vomiting.     oxyCODONE 5 MG immediate release tablet  Commonly known as:  Oxy IR/ROXICODONE  Take 1-2 tablets (5-10 mg total) by mouth every 3 (three) hours as needed for breakthrough pain.     oxyCODONE-acetaminophen 5-325 MG tablet  Commonly known as:  PERCOCET/ROXICET  Take by mouth. Reported on 07/06/2015     phenazopyridine 100 MG tablet  Commonly known as:  PYRIDIUM  Take by mouth. Reported on 07/06/2015     Polyethyl Glycol-Propyl  Glycol 0.4-0.3 % Soln  Apply 3  drops to eye as needed.     SYNTHROID 88 MCG tablet  Generic drug:  levothyroxine  Take 88 mcg by mouth daily before breakfast.     temazepam 30 MG capsule  Commonly known as:  RESTORIL  TAKE ONE CAPSULE BY MOUTH EVERY DAY AT BEDTIME     valsartan-hydrochlorothiazide 160-12.5 MG tablet  Commonly known as:  DIOVAN-HCT  Take 1 tablet by mouth daily.     venlafaxine XR 75 MG 24 hr capsule  Commonly known as:  EFFEXOR-XR  Take 75 mg by mouth daily with breakfast.     vitamin B-12 1000 MCG tablet  Commonly known as:  CYANOCOBALAMIN  Take 1,000 mcg by mouth daily.     warfarin 5 MG tablet  Commonly known as:  COUMADIN  Take 1 tablet (5 mg total) by mouth daily.        Allergies:  Allergies  Allergen Reactions  . Morphine Nausea And Vomiting    Pt prefers to use over all other strong pain meds  . Diazepam Nausea And Vomiting    Other reaction(s): Vomiting  . Salicylates     Other reaction(s): Headache  . Tape   . Rash Away  [Petrolatum-Zinc Oxide] Rash and Swelling    Family History: Family History  Problem Relation Age of Onset  . Diabetes Mother   . Hypertension Mother   . Heart disease Mother   . Diabetes Father   . Cancer Father   . Hypertension Sister   . Depression Sister   . Cancer Brother   . Bladder Cancer Neg Hx   . Kidney disease Neg Hx     Social History:  reports that she has never smoked. She has never used smokeless tobacco. She reports that she does not drink alcohol or use illicit drugs.  ROS: UROLOGY Frequent Urination?: No Hard to postpone urination?: No Burning/pain with urination?: No Get up at night to urinate?: No Leakage of urine?: No Urine stream starts and stops?: No Trouble starting stream?: Yes Do you have to strain to urinate?: Yes Blood in urine?: No Urinary tract infection?: Yes Sexually transmitted disease?: No Injury to kidneys or bladder?: No Painful intercourse?: No Weak stream?:  No Currently pregnant?: No Vaginal bleeding?: No Last menstrual period?: n  Gastrointestinal Nausea?: No Vomiting?: No Indigestion/heartburn?: Yes Diarrhea?: No Constipation?: No  Constitutional Fever: No Night sweats?: No Weight loss?: No Fatigue?: No  Skin Skin rash/lesions?: No Itching?: No  Eyes Blurred vision?: No Double vision?: No  Ears/Nose/Throat Sore throat?: No Sinus problems?: No  Hematologic/Lymphatic Swollen glands?: No Easy bruising?: No  Cardiovascular Leg swelling?: No Chest pain?: No  Respiratory Cough?: No Shortness of breath?: No  Endocrine Excessive thirst?: No  Musculoskeletal Back pain?: No Joint pain?: No  Neurological Headaches?: No Dizziness?: No  Psychologic Depression?: No Anxiety?: Yes  Physical Exam: BP 133/79 mmHg  Pulse 87  Ht 5\' 4"  (1.626 m)  Wt 204 lb 11.2 oz (92.851 kg)  BMI 35.12 kg/m2  Constitutional: Well nourished. Alert and oriented, No acute distress. HEENT: [place] AT, moist mucus membranes. Trachea midline, no masses. Cardiovascular: No clubbing, cyanosis, or edema. Respiratory: Normal respiratory effort, no increased work of breathing. GI: Abdomen is soft, non tender, non distended, no abdominal masses. Liver and spleen not palpable.  No hernias appreciated.  Stool sample for occult testing is not indicated.   GU: No CVA tenderness.  No bladder fullness or masses.  Atrophic external genitalia, normal pubic hair distribution, no lesions.  Normal  urethral meatus, no lesions, no prolapse, no discharge.   No urethral masses, tenderness and/or tenderness. No bladder fullness, tenderness or masses. Normal vagina mucosa, good estrogen effect, no discharge, no lesions, good pelvic support, no cystocele or rectocele noted.  Cervix, uterus and adnexal/parametria are surgically absent.  Anus and perineum are without rashes or lesions.    Skin: No rashes, bruises or suspicious lesions. Lymph: No cervical or inguinal  adenopathy. Neurologic: Grossly intact, no focal deficits, moving all 4 extremities. Psychiatric: Normal mood and affect.  Laboratory Data: Lab Results  Component Value Date   WBC 8.8 03/18/2015   HGB 10.0* 03/18/2015   HCT 31.4* 03/18/2015   MCV 88.0 03/18/2015   PLT 215 03/18/2015    Lab Results  Component Value Date   CREATININE 0.91 03/17/2015    Lab Results  Component Value Date   HGBA1C 6.3 09/07/2014    Lab Results  Component Value Date   TSH 0.483 02/15/2015     Lab Results  Component Value Date   AST 17 02/15/2015   Lab Results  Component Value Date   ALT 21 02/15/2015     Urinalysis Results for orders placed or performed in visit on 07/06/15  CULTURE, URINE COMPREHENSIVE  Result Value Ref Range   Urine Culture, Comprehensive Final report    Result 1 Comment   Microscopic Examination  Result Value Ref Range   WBC, UA None seen 0 -  5 /hpf   RBC, UA 0-2 0 -  2 /hpf   Epithelial Cells (non renal) 0-10 0 - 10 /hpf   Mucus, UA Present (A) Not Estab.   Bacteria, UA None seen None seen/Few  Urinalysis, Complete  Result Value Ref Range   Specific Gravity, UA 1.025 1.005 - 1.030   pH, UA 5.5 5.0 - 7.5   Color, UA Yellow Yellow   Appearance Ur Clear Clear   Leukocytes, UA Negative Negative   Protein, UA Negative Negative/Trace   Glucose, UA Negative Negative   Ketones, UA Negative Negative   RBC, UA Negative Negative   Bilirubin, UA Negative Negative   Urobilinogen, Ur 0.2 0.2 - 1.0 mg/dL   Nitrite, UA Negative Negative   Microscopic Examination See below:   BUN+Creat  Result Value Ref Range   BUN 14 8 - 27 mg/dL   Creatinine, Ser 0.73 0.57 - 1.00 mg/dL   GFR calc non Af Amer 87 >59 mL/min/1.73   GFR calc Af Amer 100 >59 mL/min/1.73   BUN/Creatinine Ratio 19 12 - 28    Pertinent Imaging: CLINICAL DATA: Gross hematuria.   EXAM:  CT ABDOMEN AND PELVIS WITHOUT AND WITH CONTRAST   TECHNIQUE:  Multidetector CT imaging of the  abdomen and pelvis was performed  following the standard protocol before and following the bolus  administration of intravenous contrast.   CONTRAST: 125 cc Isovue 370 IV.   COMPARISON: None.   FINDINGS:  Lung bases are clear. No effusions. Heart is normal size.   Precontrast images demonstrate no ureteral stones. Mild fullness of  the right renal collecting system and proximal ureter. No visible  stones. No hydronephrosis on the left.   Post-contrast images demonstrate symmetric enhancement of the  kidneys bilaterally without focal abnormality. Delayed images  demonstrate segmental opacification of the decompressed ureters  which are unremarkable. Bladder is unremarkable.   Prior hysterectomy. No adnexal masses.   Mild diffuse fatty infiltration of the liver. No focal abnormality.  Gallbladder, spleen, pancreas, adrenals are unremarkable. Stomach,  large  and small bowel unremarkable. No free fluid, free air or  adenopathy.   Postoperative changes in the lumbar spine from posterior fusion and  in the left hip from total hip replacement. No acute bony  abnormality.   IMPRESSION:  No renal or ureteral stones. No hydronephrosis. No explanation for  the patient's hematuria.   Mild fatty infiltration of the liver.    Electronically Signed   By: Rolm Baptise M.D.   On: 10/20/2013 15:16    Assessment & Plan:    1. Recurrent UTI:   Patient has had 2 documented infections over the last month.  She is using the vaginal estrogen cream 3 nights weekly.  Obtain a KUB and renal ultrasound to evaluate for stones or hydronephrosis.  UA today is unremarkable. I will send it for culture to ensure the last urinary tract infection has cleared.  2. Gross hematuria:   Patient continues to have episodes of gross hematuria associated with urinary tract infections.  She will be undergoing a renal ultrasound and KUB in the future.  She did  complete a hematuria workup in 2015 and no GU pathology was discovered.  We will continue to monitor to ensure the hematuria does not persist without an urinary tract infection.  - Urinalysis, Complete - CULTURE, URINE COMPREHENSIVE - BUN+Creat  3. Vaginal atrophy:   Patient will continue the vaginal estrogen cream 3 nights weekly.   Return for RUS and KUB report.  These notes generated with voice recognition software. I apologize for typographical errors.  Zara Council, [facility] [address], [place], [place] [address] [phone]

## 2015-07-07 LAB — BUN+CREAT
BUN/Creatinine Ratio: 19 (ref 12–28)
BUN: 14 mg/dL (ref 8–27)
Creatinine, Ser: 0.73 mg/dL (ref 0.57–1.00)
GFR calc Af Amer: 100 mL/min/{1.73_m2} (ref >59)
GFR calc non Af Amer: 87 mL/min/{1.73_m2} (ref >59)

## 2015-07-08 ENCOUNTER — Encounter: Payer: Self-pay | Admitting: Family Medicine

## 2015-07-08 ENCOUNTER — Ambulatory Visit (INDEPENDENT_AMBULATORY_CARE_PROVIDER_SITE_OTHER): Payer: Medicare Other | Admitting: Family Medicine

## 2015-07-08 VITALS — BP 148/68 | HR 86 | Temp 98.7°F | Resp 14 | Wt 205.4 lb

## 2015-07-08 DIAGNOSIS — F331 Major depressive disorder, recurrent, moderate: Secondary | ICD-10-CM | POA: Insufficient documentation

## 2015-07-08 DIAGNOSIS — G43109 Migraine with aura, not intractable, without status migrainosus: Secondary | ICD-10-CM | POA: Diagnosis not present

## 2015-07-08 DIAGNOSIS — I10 Essential (primary) hypertension: Secondary | ICD-10-CM | POA: Diagnosis not present

## 2015-07-08 DIAGNOSIS — F411 Generalized anxiety disorder: Secondary | ICD-10-CM | POA: Diagnosis not present

## 2015-07-08 DIAGNOSIS — J4540 Moderate persistent asthma, uncomplicated: Secondary | ICD-10-CM | POA: Diagnosis not present

## 2015-07-08 DIAGNOSIS — K2100 Gastro-esophageal reflux disease with esophagitis, without bleeding: Secondary | ICD-10-CM

## 2015-07-08 DIAGNOSIS — Z1231 Encounter for screening mammogram for malignant neoplasm of breast: Secondary | ICD-10-CM | POA: Diagnosis not present

## 2015-07-08 DIAGNOSIS — I209 Angina pectoris, unspecified: Secondary | ICD-10-CM | POA: Diagnosis not present

## 2015-07-08 DIAGNOSIS — M797 Fibromyalgia: Secondary | ICD-10-CM

## 2015-07-08 DIAGNOSIS — E785 Hyperlipidemia, unspecified: Secondary | ICD-10-CM | POA: Diagnosis not present

## 2015-07-08 DIAGNOSIS — I471 Supraventricular tachycardia: Secondary | ICD-10-CM | POA: Diagnosis not present

## 2015-07-08 LAB — CULTURE, URINE COMPREHENSIVE

## 2015-07-08 MED ORDER — VALSARTAN-HYDROCHLOROTHIAZIDE 160-12.5 MG PO TABS: 1.0000 | ORAL_TABLET | Freq: Every day | ORAL | Status: DC

## 2015-07-08 MED ORDER — ONDANSETRON HCL 4 MG PO TABS: 4.0000 mg | ORAL_TABLET | Freq: Three times a day (TID) | ORAL | Status: DC | PRN

## 2015-07-08 MED ORDER — VENLAFAXINE HCL ER 75 MG PO CP24: 75.0000 mg | ORAL_CAPSULE | Freq: Every day | ORAL | Status: DC

## 2015-07-08 MED ORDER — ATORVASTATIN CALCIUM 40 MG PO TABS: 40.0000 mg | ORAL_TABLET | Freq: Every day | ORAL | Status: DC

## 2015-07-08 MED ORDER — CLONAZEPAM 0.5 MG PO TABS: 0.5000 mg | ORAL_TABLET | Freq: Every day | ORAL | Status: DC | PRN

## 2015-07-08 NOTE — Progress Notes (Signed)
Name: Jacqueline Lucas   MRN: [id number]    DOB: [birth year]   Date:07/08/2015       Progress Note  Subjective  Chief Complaint  Chief Complaint  Patient presents with  . Hip Pain    patient presents with right hip pain. she stated that Dr. Marlou Sa in [place] will take care of it but she is taking care of her sick husband now.  . Medication Refill    phenergan, fenofibrate, klonopin, venlafaxine  . Hypertension    patient states that she always has headaches  . Hyperlipidemia    no negative sx  . Migraine    HPI  Dyslipidemia: currently on Lipitor and Tricor, discussed possible risk of rhabdomyolysis with the combination of the two medications and we will stop Fenofibrate and recheck labs on her next visit.  Migraine headaches with aura: she was initially diagnosed at age 41 yo, she has seen neurologist at the headache center in the past, and tried multiple medications. She states her migraine is preceded by loss of vision and scotomas on left eye, followed by  a pounding headache, associated with nausea and vomiting. Currently taking oxycodone for pain. She has photophobia. Symptoms improves when she takes a nap. She has 3  To 4 episodes per week.   HTN: she has been very worried about her husband that has been hospitalized. She has lost weight because she forgets to eat. Her bp is usually at goal. No recent chest pain or palpitation.  Chronic hip pain: she is due for a hip replacement but can't go at this time because husband is at the hospital.   Asthma: taking Dulera once daily, no current cough or wheezing.   FMS: going to Dr. Bernadene Bell, on gabapentin, still has generalized body aches   Depression/Anxiety: she is very worried about husband that has been very sick. She has lost weight, she has been crying lately, has anhedonia. Advised to wean off Clonazepam to taking prn only, not daily .   SVT: s/p ablation, not on any medication at this time, angina has resolved. She sees Dr.  Clayborn Bigness  Patient Active Problem List   Diagnosis Date Noted  . Degenerative arthritis of right knee 03/16/2015  . Atrophic vaginitis 01/31/2015  . Recurrent UTI 12/31/2014  . IBS (irritable bowel syndrome) 10/29/2014  . Asthma, moderate persistent 09/08/2014  . Allergic state 09/07/2014  . Colon polyp 09/07/2014  . Hemorrhoid 09/07/2014  . Chronic pain 09/07/2014  . Constrictive tenosynovitis 03/12/2014  . H/O total knee replacement 07/31/2013  . Angina pectoris ([place]) 11/28/2012  . Anxiety 11/28/2012  . Acid reflux 11/28/2012  . Cardiac murmur 11/28/2012  . History of migraine headaches 11/28/2012  . BP (high blood pressure) 11/28/2012  . Cannot sleep 11/28/2012  . Adiposity 11/28/2012  . Arthritis, degenerative 11/28/2012  . Restless leg 11/28/2012  . Supraventricular tachycardia ([place]) 11/28/2012  . Fibromyalgia 11/28/2012    Past Surgical History  Procedure Laterality Date  . Replacement total knee Left   . Total hip arthroplasty Left 2013  . Hemorrhoid surgery    . Abdominal hysterectomy      due to cancer of ovaries  . Spine surgery      x 2  . Tendonititis      right elbow, right wrist  . Back surgery N/A 2014    x 5  . Colonoscopy w/ polypectomy    . Cardiac catheterization      no PCI; 12/18/12 ([facility], Dr. Marcello Moores): EP study with  ablation. No coronary angiography done then.  . Total knee arthroplasty Right 03/16/2015    Procedure: RIGHT TOTAL KNEE ARTHROPLASTY, PCL SACRIFICING;  Surgeon: Meredith Pel, MD;  Location: [facility];  Service: Orthopedics;  Laterality: Right;    Family History  Problem Relation Age of Onset  . Diabetes Mother   . Hypertension Mother   . Heart disease Mother   . Diabetes Father   . Cancer Father   . Hypertension Sister   . Depression Sister   . Cancer Brother   . Bladder Cancer Neg Hx   . Kidney disease Neg Hx     Social History   Social History  . Marital Status: Married    Spouse Name: N/A  . Number of Children:  N/A  . Years of Education: N/A   Occupational History  . Not on file.   Social History Main Topics  . Smoking status: Never Smoker   . Smokeless tobacco: Never Used  . Alcohol Use: No  . Drug Use: No  . Sexual Activity: No   Other Topics Concern  . Not on file   Social History Narrative     Current outpatient prescriptions:  .  venlafaxine XR (EFFEXOR-XR) 75 MG 24 hr capsule, Take 75 mg by mouth daily with breakfast., Disp: , Rfl:  .  albuterol (PROAIR HFA) 108 (90 BASE) MCG/ACT inhaler, Inhale 2 puffs into the lungs every 6 (six) hours as needed for wheezing or shortness of breath., Disp: 1 Inhaler, Rfl: 1 .  atorvastatin (LIPITOR) 40 MG tablet, Take 1 tablet (40 mg total) by mouth at bedtime., Disp: 90 tablet, Rfl: 1 .  baclofen (LIORESAL) 10 MG tablet, Take 10 mg by mouth 2 (two) times daily as needed. , Disp: , Rfl:  .  Cholecalciferol (D3-1000) 1000 units tablet, Take 1,000 Units by mouth daily., Disp: , Rfl:  .  clonazePAM (KLONOPIN) 0.5 MG tablet, Take 1 tablet (0.5 mg total) by mouth 2 (two) times daily., Disp: 60 tablet, Rfl: 0 .  cycloSPORINE (RESTASIS) 0.05 % ophthalmic emulsion, Apply 1 drop to eye 2 (two) times daily. , Disp: , Rfl:  .  estradiol (ESTRACE) 0.1 MG/GM vaginal cream, Apply 0.5mg  (pea-sized amount)  just inside the vaginal introitus with a finger-tip every night for two weeks and then Monday, Wednesday and Friday nights.  adverse systemic effects that oral HT., Disp: 127.5 g, Rfl: 3 .  fluticasone (FLONASE) 50 MCG/ACT nasal spray, Place 1 spray into both nostrils daily as needed for allergies. , Disp: , Rfl:  .  gabapentin (NEURONTIN) 300 MG capsule, Take 900 mg by mouth 3 (three) times daily. , Disp: , Rfl:  .  lansoprazole (PREVACID) 30 MG capsule, Take 1 capsule (30 mg total) by mouth 2 (two) times daily before a meal., Disp: 180 capsule, Rfl: 3 .  levalbuterol (XOPENEX) 1.25 MG/3ML nebulizer solution, Take 1.25 mg by nebulization every 4 (four) hours as  needed for wheezing., Disp: 72 mL, Rfl: 4 .  levothyroxine (SYNTHROID) 88 MCG tablet, Take 88 mcg by mouth daily before breakfast. , Disp: , Rfl:  .  magnesium oxide (MAG-OX) 400 MG tablet, Take 400 mg by mouth daily. , Disp: , Rfl:  .  mometasone-formoterol (DULERA) 200-5 MCG/ACT AERO, Inhale 2 puffs into the lungs daily. Reported on 07/06/2015, Disp: , Rfl:  .  montelukast (SINGULAIR) 10 MG tablet, Take 1 tablet (10 mg total) by mouth at bedtime., Disp: 80 tablet, Rfl: 3 .  ondansetron (ZOFRAN) 4 MG tablet,  Take 1 tablet (4 mg total) by mouth every 8 (eight) hours as needed for nausea or vomiting., Disp: 20 tablet, Rfl: 0 .  oxyCODONE (OXY IR/ROXICODONE) 5 MG immediate release tablet, Take 1-2 tablets (5-10 mg total) by mouth every 3 (three) hours as needed for breakthrough pain. (Patient not taking: Reported on 07/06/2015), Disp: 60 tablet, Rfl: 0 .  Polyethyl Glycol-Propyl Glycol 0.4-0.3 % SOLN, Apply 3 drops to eye as needed. , Disp: , Rfl:  .  valsartan-hydrochlorothiazide (DIOVAN-HCT) 160-12.5 MG tablet, Take 1 tablet by mouth daily., Disp: 90 tablet, Rfl: 0 .  vitamin B-12 (CYANOCOBALAMIN) 1000 MCG tablet, Take 1,000 mcg by mouth daily., Disp: , Rfl:   Current facility-administered medications:  .  albuterol (PROVENTIL) (2.5 MG/3ML) 0.083% nebulizer solution 2.5 mg, 2.5 mg, Nebulization, Once, Ashok Norris, MD  Allergies  Allergen Reactions  . Morphine Nausea And Vomiting    Pt prefers to use over all other strong pain meds  . Diazepam Nausea And Vomiting    Other reaction(s): Vomiting  . Salicylates     Other reaction(s): Headache  . Tape   . Rash Away  [Petrolatum-Zinc Oxide] Rash and Swelling     ROS  Constitutional: Negative for fever, positive for  weight change.  Respiratory: Intermittent  for cough and shortness of breath.   Cardiovascular: Negative for chest pain or palpitations.  Gastrointestinal: Negative for abdominal pain, no bowel changes.  Musculoskeletal:  Negative for gait problem or joint swelling.  Skin: Negative for rash.  Neurological: Negative for dizziness or headache.  No other specific complaints in a complete review of systems (except as listed in HPI above).  Objective  Filed Vitals:   07/08/15 1101  BP: 148/66  Pulse: 86  Temp: 98.7 F (37.1 C)  TempSrc: Oral  Resp: 14  Weight: 205 lb 6.4 oz (93.169 kg)  SpO2: 94%    Body mass index is 35.24 kg/(m^2).  Physical Exam  Constitutional: Patient appears well-developed and well-nourished. Obese  No distress.  HEENT: head atraumatic, normocephalic, pupils equal and reactive to light,  neck supple, throat within normal limits Cardiovascular: Normal rate, regular rhythm and normal heart sounds.  No murmur heard. No BLE edema. Pulmonary/Chest: Effort normal and breath sounds normal. No respiratory distress. Abdominal: Soft.  There is no tenderness. Psychiatric: Patient has a normal mood and affect. behavior is normal. Judgment and thought content normal.  Recent Results (from the past 2160 hour(s))  Urinalysis, Complete     Status: None   Collection Time: 07/06/15 10:45 AM  Result Value Ref Range   Specific Gravity, UA 1.025 1.005 - 1.030   pH, UA 5.5 5.0 - 7.5   Color, UA Yellow Yellow   Appearance Ur Clear Clear   Leukocytes, UA Negative Negative   Protein, UA Negative Negative/Trace   Glucose, UA Negative Negative   Ketones, UA Negative Negative   RBC, UA Negative Negative   Bilirubin, UA Negative Negative   Urobilinogen, Ur 0.2 0.2 - 1.0 mg/dL   Nitrite, UA Negative Negative   Microscopic Examination See below:   Microscopic Examination     Status: Abnormal   Collection Time: 07/06/15 10:45 AM  Result Value Ref Range   WBC, UA None seen 0 -  5 /hpf   RBC, UA 0-2 0 -  2 /hpf   Epithelial Cells (non renal) 0-10 0 - 10 /hpf   Mucus, UA Present (A) Not Estab.   Bacteria, UA None seen None seen/Few  BUN+Creat  Status: None   Collection Time: 07/06/15 10:46 AM   Result Value Ref Range   BUN 14 8 - 27 mg/dL   Creatinine, Ser 0.73 0.57 - 1.00 mg/dL   GFR calc non Af Amer 87 >59 mL/min/1.73   GFR calc Af Amer 100 >59 mL/min/1.73   BUN/Creatinine Ratio 19 12 - 28      PHQ2/9: Depression screen [facility] 2/9 07/08/2015 12/22/2014 12/15/2014 10/29/2014 10/12/2014  Decreased Interest 0 0 0 0 0  Down, Depressed, Hopeless 0 0 0 0 0  PHQ - 2 Score 0 0 0 0 0    Fall Risk: Fall Risk  07/08/2015 12/22/2014 12/15/2014 10/29/2014 10/12/2014  Falls in the past year? No No No No No  Number falls in past yr: - - - - -  Injury with Fall? - - - - -  Risk Factor Category  - - - - -  Risk for fall due to : - - - - -    Functional Status Survey: Is the patient deaf or have difficulty hearing?: No Does the patient have difficulty seeing, even when wearing glasses/contacts?: No Does the patient have difficulty concentrating, remembering, or making decisions?: No Does the patient have difficulty walking or climbing stairs?: Yes Does the patient have difficulty dressing or bathing?: No Does the patient have difficulty doing errands alone such as visiting a doctor's office or shopping?: No   Assessment & Plan  1. Supraventricular tachycardia ([place])  Continue follow up with Dr. Clayborn Bigness  2. Essential hypertension  We will monitor for now, continue current dose of medication   3. Angina pectoris ([place])  resolved  4. Asthma, moderate persistent, uncomplicated  Continue medication   5. Gastroesophageal reflux disease with esophagitis  stable  6. Fibromyalgia  Continue Dr. Jefferson Fuel and gabapentin  7. GAD (generalized anxiety disorder)  - clonazePAM (KLONOPIN) 0.5 MG tablet; Take 1 tablet (0.5 mg total) by mouth daily as needed for anxiety.  Dispense: 30 tablet; Refill: 0 - venlafaxine XR (EFFEXOR-XR) 75 MG 24 hr capsule; Take 1 capsule (75 mg total) by mouth daily with breakfast.  Dispense: 90 capsule; Refill: 1  8. Moderate episode of recurrent major  depressive disorder (HCC)  - venlafaxine XR (EFFEXOR-XR) 75 MG 24 hr capsule; Take 1 capsule (75 mg total) by mouth daily with breakfast.  Dispense: 90 capsule; Refill: 1  9. Migraine with aura and without status migrainosus, not intractable  - ondansetron (ZOFRAN) 4 MG tablet; Take 1 tablet (4 mg total) by mouth every 8 (eight) hours as needed for nausea or vomiting.  Dispense: 20 tablet; Refill: 0  10. Dyslipidemia  - atorvastatin (LIPITOR) 40 MG tablet; Take 1 tablet (40 mg total) by mouth at bedtime.  Dispense: 90 tablet; Refill: 1

## 2015-07-08 NOTE — Addendum Note (Signed)
Addended by: Inda Coke on: 07/08/2015 01:09 PM   Modules accepted: Orders

## 2015-07-12 ENCOUNTER — Telehealth: Payer: Self-pay | Admitting: Radiology

## 2015-07-12 NOTE — Telephone Encounter (Signed)
Pt c/o hematuria after taking antibiotics. Denies pain at this time. Pt r/s f/u appt to 5/24 with Shannon from 5/23. Asks should she RTC sooner than this?

## 2015-07-12 NOTE — Telephone Encounter (Signed)
Per Vikki Ports pt should call back if she develops nausea, vomiting, fever or chills otherwise keep scheduled appt with Larene Beach. Pt voices understanding.

## 2015-07-16 ENCOUNTER — Ambulatory Visit
Admission: RE | Admit: 2015-07-16 | Discharge: 2015-07-16 | Disposition: A | Discharge status: Home / Self Care | Payer: Medicare Other | Source: Ambulatory Visit | Attending: Urology | Admitting: Urology

## 2015-07-16 DIAGNOSIS — R109 Unspecified abdominal pain: Secondary | ICD-10-CM | POA: Diagnosis not present

## 2015-07-16 DIAGNOSIS — N390 Urinary tract infection, site not specified: Secondary | ICD-10-CM

## 2015-07-19 DIAGNOSIS — M25551 Pain in right hip: Secondary | ICD-10-CM | POA: Diagnosis not present

## 2015-07-19 DIAGNOSIS — M79645 Pain in left finger(s): Secondary | ICD-10-CM | POA: Diagnosis not present

## 2015-07-19 DIAGNOSIS — M79644 Pain in right finger(s): Secondary | ICD-10-CM | POA: Diagnosis not present

## 2015-07-20 ENCOUNTER — Ambulatory Visit: Payer: Medicare Other | Admitting: Urology

## 2015-07-20 DIAGNOSIS — M797 Fibromyalgia: Secondary | ICD-10-CM | POA: Diagnosis not present

## 2015-07-20 DIAGNOSIS — M5137 Other intervertebral disc degeneration, lumbosacral region: Secondary | ICD-10-CM | POA: Diagnosis not present

## 2015-07-20 DIAGNOSIS — M174 Other bilateral secondary osteoarthritis of knee: Secondary | ICD-10-CM | POA: Diagnosis not present

## 2015-07-20 DIAGNOSIS — M166 Other bilateral secondary osteoarthritis of hip: Secondary | ICD-10-CM | POA: Diagnosis not present

## 2015-07-21 ENCOUNTER — Other Ambulatory Visit: Payer: Self-pay | Admitting: Orthopedic Surgery

## 2015-07-21 ENCOUNTER — Ambulatory Visit: Payer: Medicare Other | Admitting: Urology

## 2015-07-21 DIAGNOSIS — H2513 Age-related nuclear cataract, bilateral: Secondary | ICD-10-CM | POA: Diagnosis not present

## 2015-07-22 ENCOUNTER — Encounter: Payer: Self-pay | Admitting: Family Medicine

## 2015-07-23 ENCOUNTER — Encounter: Payer: Self-pay | Admitting: Family Medicine

## 2015-07-28 ENCOUNTER — Ambulatory Visit (INDEPENDENT_AMBULATORY_CARE_PROVIDER_SITE_OTHER): Payer: Medicare Other | Admitting: Urology

## 2015-07-28 ENCOUNTER — Encounter: Payer: Self-pay | Admitting: Urology

## 2015-07-28 VITALS — BP 127/72 | HR 80 | Ht 62.0 in | Wt 197.8 lb

## 2015-07-28 DIAGNOSIS — I209 Angina pectoris, unspecified: Secondary | ICD-10-CM

## 2015-07-28 DIAGNOSIS — N952 Postmenopausal atrophic vaginitis: Secondary | ICD-10-CM

## 2015-07-28 DIAGNOSIS — R3129 Other microscopic hematuria: Secondary | ICD-10-CM

## 2015-07-28 DIAGNOSIS — N390 Urinary tract infection, site not specified: Secondary | ICD-10-CM | POA: Diagnosis not present

## 2015-07-28 LAB — URINALYSIS, COMPLETE
Bilirubin, UA: NEGATIVE
Glucose, UA: NEGATIVE
Ketones, UA: NEGATIVE
Nitrite, UA: NEGATIVE
Protein, UA: NEGATIVE
RBC, UA: NEGATIVE
Specific Gravity, UA: 1.015 (ref 1.005–1.030)
Urobilinogen, Ur: 0.2 mg/dL (ref 0.2–1.0)
pH, UA: 7.5 (ref 5.0–7.5)

## 2015-07-28 LAB — MICROSCOPIC EXAMINATION: Bacteria, UA: NONE SEEN

## 2015-07-28 NOTE — Progress Notes (Signed)
11:03 AM   Jacqueline Lucas [birth year] [id number]  Referring provider: Ashok Norris, MD [address] [place], [place] [address]  Chief Complaint  Patient presents with  . Results    RUS and KUB    HPI: Patient is a 65 year old Caucasian female who presents today for RUS and KUB results.    Background history Patient presented to [place] recently stating that she has had 3 urinary tract infections over the last month associated with gross hematuria and blood clots.  She had a positive urine culture on 06/11/2015 for Escherichia coli and a positive urine culture on 07/01/2015 for Escherichia coli.  She states her symptoms of urinary tract infections is gross hematuria.  She does have some urinary hesitancy and straining to urinate.  She does not experience dysuria or suprapubic pain.  She underwent a hematuria work up in 09/2013 with a CT Urogram and cystoscopy and no GU pathology was discovered.    She does have vaginal atrophy and is using vaginal estrogen cream 3 nights weekly.  Today, she is experiencing nocturia, but this is baseline and not bothersome to her.  She is not having gross hematuria, but there is 3-10 RBC's/hpf on today's UA.  Renal ultrasound was completed on 07/16/2015 and was normal.  KUB was performed on the same day and it did not demonstrate any renal or ureteral calculi.  I have reviewed the films with the patient.    She is scheduled for a hip replacement in the next few weeks.    Urine culture from 07/06/2015 was negative for infection.      PMH: Past Medical History  Diagnosis Date  . Hypertension   . Asthma   . Hyperlipidemia   . Thyroid disease   . Bronchitis   . Lumbar neuritis   . Shoulder fracture, right   . Anxiety   . Arthritis   . Allergy   . Cataract   . Depression   . GERD (gastroesophageal reflux disease)   . Cancer of both ovaries ([place])   . Insomnia   . RLS (restless legs syndrome)   . Migraines   . Chronic  nausea   . Alzheimer disease   . Obesity   . Tachycardia   . Throat cancer ([place])   . Anginal pain ([place])   . Palpitation   . Fibromyalgia   . Heart murmur   . Hematuria   . Ovarian cancer ([place])   . IBS (irritable bowel syndrome)   . PONV (postoperative nausea and vomiting)   . Claustrophobia   . H/O bladder infections   . Allergy to adhesive tape     Allergy to paper tape as well  . Dry eyes     uses Restasis    Surgical History: Past Surgical History  Procedure Laterality Date  . Replacement total knee Left   . Total hip arthroplasty Left 2013  . Hemorrhoid surgery    . Abdominal hysterectomy      due to cancer of ovaries  . Spine surgery      x 2  . Tendonititis      right elbow, right wrist  . Back surgery N/A 2014    x 5  . Colonoscopy w/ polypectomy    . Cardiac catheterization      no PCI; 12/18/12 ([facility], Dr. Marcello Moores): EP study with ablation. No coronary angiography done then.  . Total knee arthroplasty Right 03/16/2015    Procedure: RIGHT TOTAL  KNEE ARTHROPLASTY, PCL SACRIFICING;  Surgeon: Meredith Pel, MD;  Location: [facility];  Service: Orthopedics;  Laterality: Right;    Home Medications:    Medication List       This list is accurate as of: 07/28/15 11:59 PM.  Always use your most recent med list.               albuterol 108 (90 Base) MCG/ACT inhaler  Commonly known as:  PROAIR HFA  Inhale 2 puffs into the lungs every 6 (six) hours as needed for wheezing or shortness of breath.     atorvastatin 40 MG tablet  Commonly known as:  LIPITOR  Take 1 tablet (40 mg total) by mouth at bedtime.     baclofen 10 MG tablet  Commonly known as:  LIORESAL  Take 10 mg by mouth 2 (two) times daily as needed. Reported on 07/28/2015     clonazePAM 0.5 MG tablet  Commonly known as:  KLONOPIN  Take 1 tablet (0.5 mg total) by mouth daily as needed for anxiety.     cyclobenzaprine 10 MG tablet  Commonly known as:  FLEXERIL  Reported on 07/28/2015      cycloSPORINE 0.05 % ophthalmic emulsion  Commonly known as:  RESTASIS  Apply 1 drop to eye 2 (two) times daily.     D3-1000 1000 units tablet  Generic drug:  Cholecalciferol  Take 1,000 Units by mouth daily.     diclofenac sodium 1 % Gel  Commonly known as:  VOLTAREN  Apply topically.     estradiol 0.1 MG/GM vaginal cream  Commonly known as:  ESTRACE  Apply 0.5mg  (pea-sized amount)  just inside the vaginal introitus with a finger-tip every night for two weeks and then Monday, Wednesday and Friday nights.  adverse systemic effects that oral HT.     fenofibrate 145 MG tablet  Commonly known as:  TRICOR  Reported on 07/28/2015     fluticasone 50 MCG/ACT nasal spray  Commonly known as:  FLONASE  Place 1 spray into both nostrils daily as needed for allergies. Reported on 07/28/2015     gabapentin 300 MG capsule  Commonly known as:  NEURONTIN  Take 900 mg by mouth 3 (three) times daily.     lansoprazole 30 MG capsule  Commonly known as:  PREVACID  Take 1 capsule (30 mg total) by mouth 2 (two) times daily before a meal.     levalbuterol 1.25 MG/3ML nebulizer solution  Commonly known as:  XOPENEX  Take 1.25 mg by nebulization every 4 (four) hours as needed for wheezing.     magnesium oxide 400 MG tablet  Commonly known as:  MAG-OX  Take 400 mg by mouth daily. Reported on 07/28/2015     mometasone-formoterol 200-5 MCG/ACT Aero  Commonly known as:  DULERA  Inhale 2 puffs into the lungs daily. Reported on 07/28/2015     montelukast 10 MG tablet  Commonly known as:  SINGULAIR  Take 1 tablet (10 mg total) by mouth at bedtime.     ondansetron 4 MG tablet  Commonly known as:  ZOFRAN  Take 1 tablet (4 mg total) by mouth every 8 (eight) hours as needed for nausea or vomiting.     oxyCODONE 5 MG immediate release tablet  Commonly known as:  Oxy IR/ROXICODONE  Take 1-2 tablets (5-10 mg total) by mouth every 3 (three) hours as needed for breakthrough pain.     Polyethyl Glycol-Propyl  Glycol 0.4-0.3 % Soln  Apply 3 drops  to eye as needed. Reported on 07/28/2015     SYNTHROID 88 MCG tablet  Generic drug:  levothyroxine  Take 88 mcg by mouth daily before breakfast.     valsartan-hydrochlorothiazide 160-12.5 MG tablet  Commonly known as:  DIOVAN-HCT  Take 1 tablet by mouth daily.     venlafaxine XR 75 MG 24 hr capsule  Commonly known as:  EFFEXOR-XR  Take 1 capsule (75 mg total) by mouth daily with breakfast.     vitamin B-12 1000 MCG tablet  Commonly known as:  CYANOCOBALAMIN  Take 1,000 mcg by mouth daily.        Allergies:  Allergies  Allergen Reactions  . Morphine Nausea And Vomiting    Pt prefers to use over all other strong pain meds  . Diazepam Nausea And Vomiting    Other reaction(s): Vomiting  . Salicylates     Other reaction(s): Headache  . Tape   . Rash Away  [Petrolatum-Zinc Oxide] Rash and Swelling    Family History: Family History  Problem Relation Age of Onset  . Diabetes Mother   . Hypertension Mother   . Heart disease Mother   . Diabetes Father   . Cancer Father   . Hypertension Sister   . Depression Sister   . Cancer Brother   . Bladder Cancer Neg Hx   . Kidney disease Neg Hx     Social History:  reports that she has never smoked. She has never used smokeless tobacco. She reports that she does not drink alcohol or use illicit drugs.  ROS: UROLOGY Frequent Urination?: No Hard to postpone urination?: No Burning/pain with urination?: No Get up at night to urinate?: Yes Leakage of urine?: No Urine stream starts and stops?: No Trouble starting stream?: No Do you have to strain to urinate?: No Blood in urine?: No Urinary tract infection?: No Sexually transmitted disease?: No Injury to kidneys or bladder?: No Painful intercourse?: No Weak stream?: No Currently pregnant?: No Vaginal bleeding?: No Last menstrual period?: n  Gastrointestinal Nausea?: Yes Vomiting?: No Indigestion/heartburn?: No Diarrhea?:  No Constipation?: Yes  Constitutional Fever: No Night sweats?: Yes Weight loss?: Yes Fatigue?: Yes  Skin Skin rash/lesions?: No Itching?: No  Eyes Blurred vision?: No Double vision?: No  Ears/Nose/Throat Sore throat?: No Sinus problems?: No  Hematologic/Lymphatic Swollen glands?: No Easy bruising?: No  Cardiovascular Leg swelling?: No Chest pain?: No  Respiratory Cough?: No Shortness of breath?: No  Endocrine Excessive thirst?: No  Musculoskeletal Back pain?: No Joint pain?: No  Neurological Headaches?: No Dizziness?: No  Psychologic Depression?: No Anxiety?: Yes  Physical Exam: BP 127/72 mmHg  Pulse 80  Ht 5\' 2"  (1.575 m)  Wt 197 lb 12.8 oz (89.721 kg)  BMI 36.17 kg/m2  Constitutional: Well nourished. Alert and oriented, No acute distress. HEENT: [place] AT, moist mucus membranes. Trachea midline, no masses. Cardiovascular: No clubbing, cyanosis, or edema. Respiratory: Normal respiratory effort, no increased work of breathing. GI: Abdomen is soft, non tender, non distended, no abdominal masses.  GU: No CVA tenderness.  No bladder fullness or masses.   Skin: No rashes, bruises or suspicious lesions. Lymph: No cervical or inguinal adenopathy. Neurologic: Grossly intact, no focal deficits, moving all 4 extremities. Psychiatric: Normal mood and affect.  Laboratory Data: Lab Results  Component Value Date   WBC 8.8 03/18/2015   HGB 10.0* 03/18/2015   HCT 31.4* 03/18/2015   MCV 88.0 03/18/2015   PLT 215 03/18/2015    Lab Results  Component Value Date  CREATININE 0.73 07/06/2015    Lab Results  Component Value Date   HGBA1C 6.3 09/07/2014    Lab Results  Component Value Date   TSH 0.483 02/15/2015     Lab Results  Component Value Date   AST 17 02/15/2015   Lab Results  Component Value Date   ALT 21 02/15/2015     Urinalysis Results for orders placed or performed in visit on 07/28/15  Microscopic Examination  Result Value  Ref Range   WBC, UA 11-30 (A) 0 -  5 /hpf   RBC, UA 3-10 (A) 0 -  2 /hpf   Epithelial Cells (non renal) 0-10 0 - 10 /hpf   Bacteria, UA None seen None seen/Few  Urinalysis, Complete  Result Value Ref Range   Specific Gravity, UA 1.015 1.005 - 1.030   pH, UA 7.5 5.0 - 7.5   Color, UA Yellow Yellow   Appearance Ur Clear Clear   Leukocytes, UA 2+ (A) Negative   Protein, UA Negative Negative/Trace   Glucose, UA Negative Negative   Ketones, UA Negative Negative   RBC, UA Negative Negative   Bilirubin, UA Negative Negative   Urobilinogen, Ur 0.2 0.2 - 1.0 mg/dL   Nitrite, UA Negative Negative   Microscopic Examination See below:     Pertinent Imaging: CLINICAL DATA: Hematuria, bladder infection.  EXAM: ABDOMEN - 1 VIEW  COMPARISON: Abdominal plain film dated 10/24/2009.  FINDINGS: Single KUB provided. No renal calculi identified. Multiple calcifications in the lower pelvis are most likely benign vascular phleboliths.  Visualized bowel gas pattern is nonobstructive. No evidence of soft tissue mass or abnormal fluid collection identified. No evidence of free intraperitoneal air. No acute- appearing osseous abnormality. Fixation hardware noted in the lumbar spine as well as a total left hip replacement.  IMPRESSION: No acute findings. No renal or ureteral calculi seen.   Electronically Signed  By: Franki Cabot M.D.  On: 07/16/2015 15:25  CLINICAL DATA: Flank pain, pain with urination, intermittent gross hematuria for 1 month. Recurrent urinary tract infections.  EXAM: RENAL / URINARY TRACT ULTRASOUND COMPLETE  COMPARISON: None.  FINDINGS: Right Kidney:  Length: 11.8 cm. Echogenicity and cortical thickness within normal limits. No mass or hydronephrosis visualized.  Left Kidney:  Length: 11.3 cm. Echogenicity and cortical thickness within normal limits. No mass or hydronephrosis visualized.  Bladder:  Appears normal for degree of  bladder distention.  IMPRESSION: Normal renal ultrasound.   Electronically Signed  By: Franki Cabot M.D.  On: 07/16/2015 15:28   Assessment & Plan:    1. Recurrent UTI:   Patient's last culture with [place] was negative.  She will report to our office for a CATH specimen if she should experience symptoms of infection.    2. Microscopic hematuria:   Patient continues to have microscopic hematuria without an urinary tract infection.   She did complete a hematuria workup in 2015 and no GU pathology was discovered.  We will refer to nephrology at this time.    - Urinalysis, Complete  3. Vaginal atrophy:   Patient will continue the vaginal estrogen cream 3 nights weekly.   Return for refer to nephrology.  These notes generated with voice recognition software. I apologize for typographical errors.  Zara Council, [facility] [address] [place], [place] [address] [phone]

## 2015-07-30 DIAGNOSIS — R3129 Other microscopic hematuria: Secondary | ICD-10-CM | POA: Insufficient documentation

## 2015-07-30 DIAGNOSIS — N952 Postmenopausal atrophic vaginitis: Secondary | ICD-10-CM | POA: Insufficient documentation

## 2015-08-02 ENCOUNTER — Telehealth: Payer: Self-pay | Admitting: Urology

## 2015-08-02 NOTE — Telephone Encounter (Signed)
Faxed referral over to [facility] at [phone] she will call patient with the appt  [facility]

## 2015-08-06 ENCOUNTER — Encounter (HOSPITAL_COMMUNITY)
Admission: RE | Admit: 2015-08-06 | Discharge: 2015-08-06 | Disposition: A | Discharge status: Home / Self Care | Payer: Medicare Other | Source: Ambulatory Visit | Attending: Orthopedic Surgery | Admitting: Orthopedic Surgery

## 2015-08-06 ENCOUNTER — Other Ambulatory Visit (HOSPITAL_COMMUNITY): Payer: Self-pay | Admitting: *Deleted

## 2015-08-06 ENCOUNTER — Encounter (HOSPITAL_COMMUNITY): Payer: Self-pay

## 2015-08-06 DIAGNOSIS — Z0183 Encounter for blood typing: Secondary | ICD-10-CM | POA: Insufficient documentation

## 2015-08-06 DIAGNOSIS — Z01812 Encounter for preprocedural laboratory examination: Secondary | ICD-10-CM | POA: Insufficient documentation

## 2015-08-06 DIAGNOSIS — M1611 Unilateral primary osteoarthritis, right hip: Secondary | ICD-10-CM | POA: Insufficient documentation

## 2015-08-06 HISTORY — DX: Anemia, unspecified: D64.9

## 2015-08-06 HISTORY — DX: Hypothyroidism, unspecified: E03.9

## 2015-08-06 LAB — BASIC METABOLIC PANEL
Anion gap: 6 (ref 5–15)
BUN: 13 mg/dL (ref 6–20)
CO2: 27 mmol/L (ref 22–32)
Calcium: 9.5 mg/dL (ref 8.9–10.3)
Chloride: 106 mmol/L (ref 101–111)
Creatinine, Ser: 0.82 mg/dL (ref 0.44–1.00)
GFR calc Af Amer: >60 mL/min (ref >60)
GFR calc non Af Amer: >60 mL/min (ref >60)
Glucose, Bld: 99 mg/dL (ref 65–99)
Potassium: 4 mmol/L (ref 3.5–5.1)
Sodium: 139 mmol/L (ref 135–145)

## 2015-08-06 LAB — URINALYSIS, ROUTINE W REFLEX MICROSCOPIC
Bilirubin Urine: NEGATIVE
Glucose, UA: NEGATIVE mg/dL
Hgb urine dipstick: NEGATIVE
Ketones, ur: NEGATIVE mg/dL
Nitrite: NEGATIVE
Protein, ur: NEGATIVE mg/dL
Specific Gravity, Urine: 1.007 (ref 1.005–1.030)
pH: 7 (ref 5.0–8.0)

## 2015-08-06 LAB — URINE MICROSCOPIC-ADD ON
Bacteria, UA: NONE SEEN
RBC / HPF: NONE SEEN RBC/hpf (ref 0–5)

## 2015-08-06 LAB — CBC
HCT: 37.8 % (ref 36.0–46.0)
Hemoglobin: 11.6 g/dL — ABNORMAL LOW (ref 12.0–15.0)
MCH: 25.4 pg — ABNORMAL LOW (ref 26.0–34.0)
MCHC: 30.7 g/dL (ref 30.0–36.0)
MCV: 82.7 fL (ref 78.0–100.0)
Platelets: 263 10*3/uL (ref 150–400)
RBC: 4.57 MIL/uL (ref 3.87–5.11)
RDW: 17 % — ABNORMAL HIGH (ref 11.5–15.5)
WBC: 5.8 10*3/uL (ref 4.0–10.5)

## 2015-08-06 LAB — TYPE AND SCREEN
ABO/RH(D): O POS
Antibody Screen: NEGATIVE

## 2015-08-06 LAB — SURGICAL PCR SCREEN
MRSA, PCR: NEGATIVE
Staphylococcus aureus: NEGATIVE

## 2015-08-06 NOTE — Pre-Procedure Instructions (Signed)
    Jacqueline Lucas  08/06/2015      [facility] [place], [place] [address] Phone: [phone] Fax: [phone]  [id number] [name], [place] [address] [place] [address] Phone: [phone] Fax: [phone]    Your procedure is scheduled on 08-15-2017    Monday .  Report to [facility] Admitting at 10:00 A.M.   Call this number if you have problems the morning of surgery:  [phone]   Remember:  Do not eat food or drink liquids after midnight.   Take these medicines the morning of surgery with A SIP OF WATER Tylenol if needed,inhaler if needed,baclofen if needed,clonazepam(Klonopin) if needed,Restatasis eye drops,gabapentin(Neurotin),Prevacid,,nebulizer solution if needed,levothyroxine(synthroid),Dulera,Pain medication,               STOP ASPIRIN,ANTI-INFLAMMATORIES,VITAMINS AND HERBAL SUPPLEMENTS 5-7 DAYS PRIOR TO SURGERY.   Do not wear jewelry, make-up or nail polish.  Do not wear lotions, powders, or perfumes.  You may NOT wear deodorant.  Do not shave 48 hours prior to surgery.    Do not bring valuables to the hospital.  [facility] is not responsible for any belongings or valuables.  Contacts, dentures or bridgework may not be worn into surgery.  Leave your suitcase in the car.  After surgery it may be brought to your room.  For patients admitted to the hospital, discharge time will be determined by your treatment team.  Patients discharged the day of surgery will not be allowed to drive home.    Special instructions:  See attached Sheet for instructions on CHG showers  Please read over the following fact sheets that you were given. MRSA Information and Surgical Site Infection Prevention

## 2015-08-06 NOTE — Progress Notes (Addendum)
Last OV and any cardiac studies requested from Dr. Lujean Amel.

## 2015-08-08 LAB — URINE CULTURE: Culture: 10000 — AB

## 2015-08-10 ENCOUNTER — Ambulatory Visit (INDEPENDENT_AMBULATORY_CARE_PROVIDER_SITE_OTHER): Payer: Medicare Other | Admitting: Family Medicine

## 2015-08-10 ENCOUNTER — Encounter: Payer: Self-pay | Admitting: Family Medicine

## 2015-08-10 VITALS — BP 146/72 | HR 92 | Temp 98.4°F | Resp 16 | Ht 62.0 in | Wt 197.9 lb

## 2015-08-10 DIAGNOSIS — I10 Essential (primary) hypertension: Secondary | ICD-10-CM | POA: Diagnosis not present

## 2015-08-10 DIAGNOSIS — M159 Polyosteoarthritis, unspecified: Secondary | ICD-10-CM

## 2015-08-10 DIAGNOSIS — F411 Generalized anxiety disorder: Secondary | ICD-10-CM

## 2015-08-10 DIAGNOSIS — G43109 Migraine with aura, not intractable, without status migrainosus: Secondary | ICD-10-CM | POA: Diagnosis not present

## 2015-08-10 DIAGNOSIS — I209 Angina pectoris, unspecified: Secondary | ICD-10-CM

## 2015-08-10 DIAGNOSIS — M150 Primary generalized (osteo)arthritis: Secondary | ICD-10-CM | POA: Diagnosis not present

## 2015-08-10 DIAGNOSIS — F331 Major depressive disorder, recurrent, moderate: Secondary | ICD-10-CM | POA: Diagnosis not present

## 2015-08-10 MED ORDER — VALSARTAN-HYDROCHLOROTHIAZIDE 320-12.5 MG PO TABS: 1.0000 | ORAL_TABLET | Freq: Every day | ORAL | Status: DC

## 2015-08-10 MED ORDER — VENLAFAXINE HCL ER 150 MG PO CP24: 150.0000 mg | ORAL_CAPSULE | Freq: Every day | ORAL | Status: DC

## 2015-08-10 NOTE — Progress Notes (Signed)
Name: Jacqueline Lucas   MRN: [id number]    DOB: Dec 09, 1950   Date:08/10/2015       Progress Note  Subjective  Chief Complaint  Chief Complaint  Patient presents with  . Follow-up    1 month   . Migraine    Takes Zofran as needed, migraines have been decreasing and only having once weekly now  . Hypertension    Patient states she has been unable to follow up with Dr. Clayborn Bigness due to having hip surgery Monday 08/16/15  . Depression    Well controlling patient symptoms    HPI  Migraine headaches with aura: she was initially diagnosed at age 55 yo, she has seen neurologist at the headache center in the past, and tried multiple medications. She states her migraine is preceded by loss of vision and scotomas on left eye, followed by a pounding headache, associated with nausea and vomiting. Currently taking oxycodone for pain. She has photophobia. Symptoms improves when she takes a nap. Since started on Effexor episodes are less frequent about once weekly.   HTN: she has been very worried about her husband that has been hospitalized for months now. Her bp is still elevated. No recent chest pain or palpitation. We will adjust her bp medication today  Depression/Anxiety: she is very worried about husband that has been very sick. She has lost weight, she is taking care of everything on her own. She is having hip replacement surgery next week and has been feeling anxious about it. She  has anhedonia. She states Effexor is keeping her more calm, worrying less than usual. Having less crying spells.    Patient Active Problem List   Diagnosis Date Noted  . Microscopic hematuria 07/30/2015  . Vaginal atrophy 07/30/2015  . GAD (generalized anxiety disorder) 07/08/2015  . Moderate episode of recurrent major depressive disorder ([place]) 07/08/2015  . Migraine with aura and without status migrainosus, not intractable 07/08/2015  . Dyslipidemia 07/08/2015  . Degenerative arthritis of right knee  03/16/2015  . Atrophic vaginitis 01/31/2015  . Recurrent UTI 12/31/2014  . IBS (irritable bowel syndrome) 10/29/2014  . Asthma, moderate persistent 09/08/2014  . Allergic state 09/07/2014  . Colon polyp 09/07/2014  . Hemorrhoid 09/07/2014  . Constrictive tenosynovitis 03/12/2014  . H/O total knee replacement 07/31/2013  . Angina pectoris ([place]) 11/28/2012  . Anxiety 11/28/2012  . Acid reflux 11/28/2012  . Cardiac murmur 11/28/2012  . BP (high blood pressure) 11/28/2012  . Cannot sleep 11/28/2012  . Adiposity 11/28/2012  . Arthritis, degenerative 11/28/2012  . Restless leg 11/28/2012  . Supraventricular tachycardia ([place]) 11/28/2012  . Fibromyalgia 11/28/2012    Past Surgical History  Procedure Laterality Date  . Replacement total knee Left   . Total hip arthroplasty Left 2013  . Hemorrhoid surgery    . Abdominal hysterectomy      due to cancer of ovaries  . Spine surgery      x 2  . Tendonititis      right elbow, right wrist  . Back surgery N/A 2014    x 5  . Colonoscopy w/ polypectomy    . Cardiac catheterization      no PCI; 12/18/12 ([facility], Dr. Marcello Moores): EP study with ablation. No coronary angiography done then.  . Total knee arthroplasty Right 03/16/2015    Procedure: RIGHT TOTAL KNEE ARTHROPLASTY, PCL SACRIFICING;  Surgeon: Meredith Pel, MD;  Location: [facility];  Service: Orthopedics;  Laterality: Right;  . Joint replacement Bilateral  knee    Family History  Problem Relation Age of Onset  . Diabetes Mother   . Hypertension Mother   . Heart disease Mother   . Diabetes Father   . Cancer Father   . Hypertension Sister   . Depression Sister   . Cancer Brother   . Bladder Cancer Neg Hx   . Kidney disease Neg Hx     Social History   Social History  . Marital Status: Married    Spouse Name: N/A  . Number of Children: N/A  . Years of Education: N/A   Occupational History  . Not on file.   Social History Main Topics  . Smoking status: Never Smoker    . Smokeless tobacco: Never Used  . Alcohol Use: No  . Drug Use: No  . Sexual Activity: No   Other Topics Concern  . Not on file   Social History Narrative     Current outpatient prescriptions:  .  acetaminophen (TYLENOL) 500 MG tablet, Take 1,000 mg by mouth 2 (two) times daily., Disp: , Rfl:  .  albuterol (PROAIR HFA) 108 (90 BASE) MCG/ACT inhaler, Inhale 2 puffs into the lungs every 6 (six) hours as needed for wheezing or shortness of breath., Disp: 1 Inhaler, Rfl: 1 .  amoxicillin (AMOXIL) 500 MG capsule, Take 2,000 mg by mouth See admin instructions. Take 4 capsules (2000 mg) by mouth one hour prior to dental appointment, Disp: , Rfl:  .  aspirin EC 325 MG tablet, Take 325 mg by mouth at bedtime., Disp: , Rfl:  .  atorvastatin (LIPITOR) 40 MG tablet, Take 1 tablet (40 mg total) by mouth at bedtime., Disp: 90 tablet, Rfl: 1 .  baclofen (LIORESAL) 10 MG tablet, Take 10 mg by mouth 2 (two) times daily as needed for muscle spasms. Reported on 07/28/2015, Disp: , Rfl:  .  Cholecalciferol (D3-1000) 1000 units tablet, Take 1,000 Units by mouth at bedtime. , Disp: , Rfl:  .  ciprofloxacin (CIPRO) 500 MG tablet, , Disp: , Rfl:  .  clonazePAM (KLONOPIN) 0.5 MG tablet, Take 1 tablet (0.5 mg total) by mouth daily as needed for anxiety., Disp: 30 tablet, Rfl: 0 .  cycloSPORINE (RESTASIS) 0.05 % ophthalmic emulsion, Place 1 drop into both eyes 2 (two) times daily. , Disp: , Rfl:  .  diclofenac sodium (VOLTAREN) 1 % GEL, Apply 1 application topically 3 (three) times daily as needed (pain/ fibromyalgia). , Disp: , Rfl:  .  estradiol (ESTRACE) 0.1 MG/GM vaginal cream, Apply 0.20m (pea-sized amount)  just inside the vaginal introitus with a finger-tip every night for two weeks and then Monday, Wednesday and Friday nights.  adverse systemic effects that oral HT. (Patient taking differently: Place 1 Applicatorful vaginally every 3 (three) days. Apply 0.561m(pea-sized amount)  just inside the vaginal  introitus with a finger-tip every 3rd night -  adverse systemic effects that oral HT.), Disp: 127.5 g, Rfl: 3 .  gabapentin (NEURONTIN) 300 MG capsule, Take 900 mg by mouth 3 (three) times daily. , Disp: , Rfl:  .  lansoprazole (PREVACID) 30 MG capsule, Take 1 capsule (30 mg total) by mouth 2 (two) times daily before a meal., Disp: 180 capsule, Rfl: 3 .  levalbuterol (XOPENEX) 1.25 MG/3ML nebulizer solution, Take 1.25 mg by nebulization every 4 (four) hours as needed for wheezing., Disp: 72 mL, Rfl: 4 .  levothyroxine (SYNTHROID) 88 MCG tablet, Take 88 mcg by mouth daily before breakfast. , Disp: , Rfl:  .  magnesium oxide (MAG-OX) 400 MG tablet, Take 400 mg by mouth at bedtime. Reported on 07/28/2015, Disp: , Rfl:  .  mometasone-formoterol (DULERA) 200-5 MCG/ACT AERO, Inhale 2 puffs into the lungs 2 (two) times daily. Reported on 07/28/2015, Disp: , Rfl:  .  montelukast (SINGULAIR) 10 MG tablet, Take 1 tablet (10 mg total) by mouth at bedtime., Disp: 80 tablet, Rfl: 3 .  ondansetron (ZOFRAN) 4 MG tablet, Take 1 tablet (4 mg total) by mouth every 8 (eight) hours as needed for nausea or vomiting., Disp: 20 tablet, Rfl: 0 .  oxyCODONE (OXY IR/ROXICODONE) 5 MG immediate release tablet, Take 1-2 tablets (5-10 mg total) by mouth every 3 (three) hours as needed for breakthrough pain. (Patient taking differently: Take 5 mg by mouth every 3 (three) hours as needed for breakthrough pain (migraine). ), Disp: 60 tablet, Rfl: 0 .  Polyethyl Glycol-Propyl Glycol 0.4-0.3 % SOLN, Place 3 drops into both eyes 2 (two) times daily as needed (dry eyes). Reported on 07/28/2015, Disp: , Rfl:  .  valsartan-hydrochlorothiazide (DIOVAN-HCT) 160-12.5 MG tablet, Take 1 tablet by mouth daily., Disp: 90 tablet, Rfl: 1 .  venlafaxine XR (EFFEXOR-XR) 75 MG 24 hr capsule, Take 1 capsule (75 mg total) by mouth daily with breakfast., Disp: 90 capsule, Rfl: 1 .  vitamin B-12 (CYANOCOBALAMIN) 1000 MCG tablet, Take 1,000 mcg by mouth at  bedtime., Disp: , Rfl:   Current facility-administered medications:  .  albuterol (PROVENTIL) (2.5 MG/3ML) 0.083% nebulizer solution 2.5 mg, 2.5 mg, Nebulization, Once, Ashok Norris, MD  Allergies  Allergen Reactions  . Morphine Nausea And Vomiting    Ok with nausea medication  . Diazepam Nausea And Vomiting  . Salicylates     Other reaction(s): Headache  . Rash Away  [Petrolatum-Zinc Oxide] Rash and Swelling  . Tape Rash    Please use paper tape     ROS  Ten systems reviewed and is negative except as mentioned in HPI  She had Cipro called in by Urologist for hematuria seen by them.  Pain on right hip and having surgery next week  Objective  Filed Vitals:   08/10/15 1442  BP: 146/72  Pulse: 92  Temp: 98.4 F (36.9 C)  TempSrc: Oral  Resp: 16  Height: '5\' 2"'$  (1.575 m)  Weight: 197 lb 14.4 oz (89.767 kg)  SpO2: 97%    Body mass index is 36.19 kg/(m^2).  Physical Exam  Constitutional: Patient appears well-developed and well-nourished. Obese No distress.  HEENT: head atraumatic, normocephalic, pupils equal and reactive to light, e neck supple, throat within normal limits Cardiovascular: Normal rate, regular rhythm and normal heart sounds.  No murmur heard. No BLE edema. Pulmonary/Chest: Effort normal and breath sounds normal. No respiratory distress. Abdominal: Soft.  There is no tenderness. Psychiatric: Patient has a normal mood and affect. behavior is normal. Judgment and thought content normal. Muscular Skeletal: pain with rom of right hip  Recent Results (from the past 2160 hour(s))  Urinalysis, Complete     Status: None   Collection Time: 07/06/15 10:45 AM  Result Value Ref Range   Specific Gravity, UA 1.025 1.005 - 1.030   pH, UA 5.5 5.0 - 7.5   Color, UA Yellow Yellow   Appearance Ur Clear Clear   Leukocytes, UA Negative Negative   Protein, UA Negative Negative/Trace   Glucose, UA Negative Negative   Ketones, UA Negative Negative   RBC, UA Negative  Negative   Bilirubin, UA Negative Negative   Urobilinogen, Ur 0.2 0.2 -  1.0 mg/dL   Nitrite, UA Negative Negative   Microscopic Examination See below:   CULTURE, URINE COMPREHENSIVE     Status: None   Collection Time: 07/06/15 10:45 AM  Result Value Ref Range   Urine Culture, Comprehensive Final report    Result 1 Comment     Comment: Mixed urogenital flora 1,000 Colonies/mL   Microscopic Examination     Status: Abnormal   Collection Time: 07/06/15 10:45 AM  Result Value Ref Range   WBC, UA None seen 0 -  5 /hpf   RBC, UA 0-2 0 -  2 /hpf   Epithelial Cells (non renal) 0-10 0 - 10 /hpf   Mucus, UA Present (A) Not Estab.   Bacteria, UA None seen None seen/Few  BUN+Creat     Status: None   Collection Time: 07/06/15 10:46 AM  Result Value Ref Range   BUN 14 8 - 27 mg/dL   Creatinine, Ser 3.35 0.57 - 1.00 mg/dL   GFR calc non Af Amer 87 >59 mL/min/1.73   GFR calc Af Amer 100 >59 mL/min/1.73   BUN/Creatinine Ratio 19 12 - 28  Urinalysis, Complete     Status: Abnormal   Collection Time: 07/28/15  3:51 PM  Result Value Ref Range   Specific Gravity, UA 1.015 1.005 - 1.030   pH, UA 7.5 5.0 - 7.5   Color, UA Yellow Yellow   Appearance Ur Clear Clear   Leukocytes, UA 2+ (A) Negative   Protein, UA Negative Negative/Trace   Glucose, UA Negative Negative   Ketones, UA Negative Negative   RBC, UA Negative Negative   Bilirubin, UA Negative Negative   Urobilinogen, Ur 0.2 0.2 - 1.0 mg/dL   Nitrite, UA Negative Negative   Microscopic Examination See below:   Microscopic Examination     Status: Abnormal   Collection Time: 07/28/15  3:51 PM  Result Value Ref Range   WBC, UA 11-30 (A) 0 -  5 /hpf   RBC, UA 3-10 (A) 0 -  2 /hpf   Epithelial Cells (non renal) 0-10 0 - 10 /hpf   Bacteria, UA None seen None seen/Few  Surgical pcr screen     Status: None   Collection Time: 08/06/15  3:28 PM  Result Value Ref Range   MRSA, PCR NEGATIVE NEGATIVE   Staphylococcus aureus NEGATIVE NEGATIVE     Comment:        The Xpert SA Assay (FDA approved for NASAL specimens in patients over 13 years of age), is one component of a comprehensive surveillance program.  Test performance has been validated by [facility] for patients greater than or equal to 14 year old. It is not intended to diagnose infection nor to guide or monitor treatment.   Basic metabolic panel     Status: None   Collection Time: 08/06/15  3:28 PM  Result Value Ref Range   Sodium 139 135 - 145 mmol/L   Potassium 4.0 3.5 - 5.1 mmol/L   Chloride 106 101 - 111 mmol/L   CO2 27 22 - 32 mmol/L   Glucose, Bld 99 65 - 99 mg/dL   BUN 13 6 - 20 mg/dL   Creatinine, Ser 4.56 0.44 - 1.00 mg/dL   Calcium 9.5 8.9 - 25.6 mg/dL   GFR calc non Af Amer >60 >60 mL/min   GFR calc Af Amer >60 >60 mL/min    Comment: (NOTE) The eGFR has been calculated using the CKD EPI equation. This calculation has not been validated  in all clinical situations. eGFR's persistently <60 mL/min signify possible Chronic Kidney Disease.    Anion gap 6 5 - 15  CBC     Status: Abnormal   Collection Time: 08/06/15  3:28 PM  Result Value Ref Range   WBC 5.8 4.0 - 10.5 K/uL   RBC 4.57 3.87 - 5.11 MIL/uL   Hemoglobin 11.6 (L) 12.0 - 15.0 g/dL   HCT 37.8 36.0 - 46.0 %   MCV 82.7 78.0 - 100.0 fL   MCH 25.4 (L) 26.0 - 34.0 pg   MCHC 30.7 30.0 - 36.0 g/dL   RDW 17.0 (H) 11.5 - 15.5 %   Platelets 263 150 - 400 K/uL  Urinalysis, Routine w reflex microscopic (not at [facility])     Status: Abnormal   Collection Time: 08/06/15  3:28 PM  Result Value Ref Range   Color, Urine YELLOW YELLOW   APPearance CLEAR CLEAR   Specific Gravity, Urine 1.007 1.005 - 1.030   pH 7.0 5.0 - 8.0   Glucose, UA NEGATIVE NEGATIVE mg/dL   Hgb urine dipstick NEGATIVE NEGATIVE   Bilirubin Urine NEGATIVE NEGATIVE   Ketones, ur NEGATIVE NEGATIVE mg/dL   Protein, ur NEGATIVE NEGATIVE mg/dL   Nitrite NEGATIVE NEGATIVE   Leukocytes, UA TRACE (A) NEGATIVE  Urine culture     Status:  Abnormal   Collection Time: 08/06/15  3:28 PM  Result Value Ref Range   Specimen Description URINE, CLEAN CATCH    Special Requests NONE    Culture 10,000 COLONIES/mL ESCHERICHIA COLI (A)    Report Status 08/08/2015 FINAL    Organism ID, Bacteria ESCHERICHIA COLI (A)       Susceptibility   Escherichia coli - MIC*    AMPICILLIN >=32 RESISTANT Resistant     CEFAZOLIN <=4 SENSITIVE Sensitive     CEFTRIAXONE <=1 SENSITIVE Sensitive     CIPROFLOXACIN >=4 RESISTANT Resistant     GENTAMICIN >=16 RESISTANT Resistant     IMIPENEM <=0.25 SENSITIVE Sensitive     NITROFURANTOIN <=16 SENSITIVE Sensitive     TRIMETH/SULFA >=320 RESISTANT Resistant     AMPICILLIN/SULBACTAM 16 INTERMEDIATE Intermediate     PIP/TAZO <=4 SENSITIVE Sensitive     * 10,000 COLONIES/mL ESCHERICHIA COLI  Urine microscopic-add on     Status: Abnormal   Collection Time: 08/06/15  3:28 PM  Result Value Ref Range   Squamous Epithelial / LPF 0-5 (A) NONE SEEN   WBC, UA 0-5 0 - 5 WBC/hpf   RBC / HPF NONE SEEN 0 - 5 RBC/hpf   Bacteria, UA NONE SEEN NONE SEEN  Type and screen Order type and screen if day of surgery is less than 15 days from draw of preadmission visit or order morning of surgery if day of surgery is greater than 6 days from preadmission visit.     Status: None   Collection Time: 08/06/15  3:40 PM  Result Value Ref Range   ABO/RH(D) O POS    Antibody Screen NEG    Sample Expiration 08/20/2015    Extend sample reason NO TRANSFUSIONS OR PREGNANCY IN THE PAST 3 MONTHS      PHQ2/9: Depression screen [facility] 2/9 07/08/2015 12/22/2014 12/15/2014 10/29/2014 10/12/2014  Decreased Interest 0 0 0 0 0  Down, Depressed, Hopeless 0 0 0 0 0  PHQ - 2 Score 0 0 0 0 0    Fall Risk: Fall Risk  07/08/2015 12/22/2014 12/15/2014 10/29/2014 10/12/2014  Falls in the past year? _0   Number  falls in past yr: - - - - -  Injury with Fall? - - - - -  Risk Factor Category  - - - - -  Risk for fall due to : - - - - -      Assessment & Plan  1. Essential hypertension  - valsartan-hydrochlorothiazide (DIOVAN HCT) 320-12.5 MG tablet; Take 1 tablet by mouth daily.  Dispense: 90 tablet; Refill: 1  2. Migraine with aura and without status migrainosus, not intractable  Doing better, on prn medication   3. Moderate episode of recurrent major depressive disorder ([place])  Symptoms have improved, but not in remission yet, we will increase dose to 150 mg daily.  - venlafaxine XR (EFFEXOR-XR) 150 MG 24 hr capsule; Take 1 capsule (150 mg total) by mouth daily with breakfast.  Dispense: 90 capsule; Refill: 0  4. GAD (generalized anxiety disorder)  - venlafaxine XR (EFFEXOR-XR) 150 MG 24 hr capsule; Take 1 capsule (150 mg total) by mouth daily with breakfast.  Dispense: 90 capsule; Refill: 0   5. Primary osteoarthritis involving multiple joints  Having surgery next week.

## 2015-08-15 MED ORDER — CEFAZOLIN SODIUM-DEXTROSE 2-4 GM/100ML-% IV SOLN
2.0000 g | INTRAVENOUS | Status: AC
  Administered 2015-08-16: 2 g via INTRAVENOUS
  Filled 2015-08-15: qty 100

## 2015-08-16 ENCOUNTER — Inpatient Hospital Stay (HOSPITAL_COMMUNITY)
Admission: RE | Admit: 2015-08-16 | Discharge: 2015-08-18 | DRG: 470 | Disposition: A | Discharge status: Home / Self Care | Payer: Medicare Other | Source: Ambulatory Visit | Attending: Orthopedic Surgery | Admitting: Orthopedic Surgery

## 2015-08-16 ENCOUNTER — Encounter (HOSPITAL_COMMUNITY)
Admission: RE | Disposition: A | Discharge status: Home / Self Care | Payer: Self-pay | Source: Ambulatory Visit | Attending: Orthopedic Surgery

## 2015-08-16 ENCOUNTER — Inpatient Hospital Stay (HOSPITAL_COMMUNITY): Payer: Medicare Other | Admitting: Anesthesiology

## 2015-08-16 ENCOUNTER — Encounter (HOSPITAL_COMMUNITY): Payer: Self-pay | Admitting: Anesthesiology

## 2015-08-16 ENCOUNTER — Inpatient Hospital Stay (HOSPITAL_COMMUNITY): Payer: Medicare Other

## 2015-08-16 DIAGNOSIS — Z79891 Long term (current) use of opiate analgesic: Secondary | ICD-10-CM | POA: Diagnosis not present

## 2015-08-16 DIAGNOSIS — Z96641 Presence of right artificial hip joint: Secondary | ICD-10-CM | POA: Diagnosis not present

## 2015-08-16 DIAGNOSIS — Z7951 Long term (current) use of inhaled steroids: Secondary | ICD-10-CM

## 2015-08-16 DIAGNOSIS — J4540 Moderate persistent asthma, uncomplicated: Secondary | ICD-10-CM | POA: Diagnosis present

## 2015-08-16 DIAGNOSIS — Z7982 Long term (current) use of aspirin: Secondary | ICD-10-CM | POA: Diagnosis not present

## 2015-08-16 DIAGNOSIS — I10 Essential (primary) hypertension: Secondary | ICD-10-CM | POA: Diagnosis present

## 2015-08-16 DIAGNOSIS — M1611 Unilateral primary osteoarthritis, right hip: Secondary | ICD-10-CM | POA: Diagnosis not present

## 2015-08-16 DIAGNOSIS — Z79899 Other long term (current) drug therapy: Secondary | ICD-10-CM

## 2015-08-16 DIAGNOSIS — E669 Obesity, unspecified: Secondary | ICD-10-CM | POA: Diagnosis present

## 2015-08-16 DIAGNOSIS — M797 Fibromyalgia: Secondary | ICD-10-CM | POA: Diagnosis present

## 2015-08-16 DIAGNOSIS — M169 Osteoarthritis of hip, unspecified: Secondary | ICD-10-CM | POA: Diagnosis not present

## 2015-08-16 DIAGNOSIS — K589 Irritable bowel syndrome without diarrhea: Secondary | ICD-10-CM | POA: Diagnosis not present

## 2015-08-16 DIAGNOSIS — Z96651 Presence of right artificial knee joint: Secondary | ICD-10-CM | POA: Diagnosis present

## 2015-08-16 DIAGNOSIS — I4519 Other right bundle-branch block: Secondary | ICD-10-CM | POA: Diagnosis present

## 2015-08-16 DIAGNOSIS — Z90710 Acquired absence of both cervix and uterus: Secondary | ICD-10-CM | POA: Diagnosis not present

## 2015-08-16 DIAGNOSIS — K219 Gastro-esophageal reflux disease without esophagitis: Secondary | ICD-10-CM | POA: Diagnosis present

## 2015-08-16 DIAGNOSIS — F411 Generalized anxiety disorder: Secondary | ICD-10-CM | POA: Diagnosis present

## 2015-08-16 DIAGNOSIS — Z96642 Presence of left artificial hip joint: Secondary | ICD-10-CM | POA: Diagnosis present

## 2015-08-16 DIAGNOSIS — M1610 Unilateral primary osteoarthritis, unspecified hip: Secondary | ICD-10-CM | POA: Diagnosis present

## 2015-08-16 DIAGNOSIS — E785 Hyperlipidemia, unspecified: Secondary | ICD-10-CM | POA: Diagnosis present

## 2015-08-16 DIAGNOSIS — Z6836 Body mass index (BMI) 36.0-36.9, adult: Secondary | ICD-10-CM | POA: Diagnosis not present

## 2015-08-16 DIAGNOSIS — Z471 Aftercare following joint replacement surgery: Secondary | ICD-10-CM | POA: Diagnosis not present

## 2015-08-16 DIAGNOSIS — Z419 Encounter for procedure for purposes other than remedying health state, unspecified: Secondary | ICD-10-CM

## 2015-08-16 DIAGNOSIS — E039 Hypothyroidism, unspecified: Secondary | ICD-10-CM | POA: Diagnosis present

## 2015-08-16 HISTORY — PX: TOTAL HIP ARTHROPLASTY: SHX124

## 2015-08-16 SURGERY — ARTHROPLASTY, HIP, TOTAL, ANTERIOR APPROACH
Anesthesia: General | Site: Hip | Laterality: Right

## 2015-08-16 MED ORDER — BUPIVACAINE-EPINEPHRINE 0.25% -1:200000 IJ SOLN: INTRAMUSCULAR | Status: DC | PRN

## 2015-08-16 MED ORDER — MOMETASONE FURO-FORMOTEROL FUM 200-5 MCG/ACT IN AERO
2.0000 | INHALATION_SPRAY | Freq: Two times a day (BID) | RESPIRATORY_TRACT | Status: DC
  Administered 2015-08-16 – 2015-08-18 (×4): 2 via RESPIRATORY_TRACT
  Filled 2015-08-16: qty 8.8

## 2015-08-16 MED ORDER — BUPIVACAINE HCL (PF) 0.25 % IJ SOLN
INTRAMUSCULAR | Status: AC
  Filled 2015-08-16: qty 30

## 2015-08-16 MED ORDER — PROPOFOL 10 MG/ML IV BOLUS
INTRAVENOUS | Status: DC | PRN
  Administered 2015-08-16: 150 mg via INTRAVENOUS

## 2015-08-16 MED ORDER — FENTANYL CITRATE (PF) 250 MCG/5ML IJ SOLN
INTRAMUSCULAR | Status: AC
  Filled 2015-08-16: qty 5

## 2015-08-16 MED ORDER — ROCURONIUM BROMIDE 100 MG/10ML IV SOLN
INTRAVENOUS | Status: DC | PRN
  Administered 2015-08-16: 40 mg via INTRAVENOUS
  Administered 2015-08-16: 10 mg via INTRAVENOUS

## 2015-08-16 MED ORDER — MONTELUKAST SODIUM 10 MG PO TABS
10.0000 mg | ORAL_TABLET | Freq: Every day | ORAL | Status: DC
  Administered 2015-08-16 – 2015-08-17 (×2): 10 mg via ORAL
  Filled 2015-08-16 (×2): qty 1

## 2015-08-16 MED ORDER — 0.9 % SODIUM CHLORIDE (POUR BTL) OPTIME
TOPICAL | Status: DC | PRN
  Administered 2015-08-16 (×2): 1000 mL

## 2015-08-16 MED ORDER — DEXAMETHASONE SODIUM PHOSPHATE 10 MG/ML IJ SOLN
INTRAMUSCULAR | Status: AC
  Filled 2015-08-16: qty 1

## 2015-08-16 MED ORDER — VITAMIN B-12 1000 MCG PO TABS
1000.0000 ug | ORAL_TABLET | Freq: Every day | ORAL | Status: DC
  Administered 2015-08-17 (×2): 1000 ug via ORAL
  Filled 2015-08-16 (×2): qty 1

## 2015-08-16 MED ORDER — SUCCINYLCHOLINE CHLORIDE 200 MG/10ML IV SOSY
PREFILLED_SYRINGE | INTRAVENOUS | Status: AC
  Filled 2015-08-16: qty 10

## 2015-08-16 MED ORDER — GLYCOPYRROLATE 0.2 MG/ML IJ SOLN
INTRAMUSCULAR | Status: DC | PRN
  Administered 2015-08-16: 0.4 mg via INTRAVENOUS

## 2015-08-16 MED ORDER — FENTANYL CITRATE (PF) 100 MCG/2ML IJ SOLN
INTRAMUSCULAR | Status: DC | PRN
  Administered 2015-08-16: 25 ug via INTRAVENOUS
  Administered 2015-08-16 (×2): 50 ug via INTRAVENOUS
  Administered 2015-08-16: 25 ug via INTRAVENOUS
  Administered 2015-08-16 (×4): 50 ug via INTRAVENOUS
  Administered 2015-08-16: 25 ug via INTRAVENOUS
  Administered 2015-08-16 (×2): 50 ug via INTRAVENOUS
  Administered 2015-08-16: 25 ug via INTRAVENOUS

## 2015-08-16 MED ORDER — ARTIFICIAL TEARS OP OINT
TOPICAL_OINTMENT | OPHTHALMIC | Status: AC
  Filled 2015-08-16: qty 3.5

## 2015-08-16 MED ORDER — CHLORHEXIDINE GLUCONATE 4 % EX LIQD: 60.0000 mL | Freq: Once | CUTANEOUS | Status: DC

## 2015-08-16 MED ORDER — HYDROCHLOROTHIAZIDE 12.5 MG PO CAPS
12.5000 mg | ORAL_CAPSULE | Freq: Every day | ORAL | Status: DC
  Administered 2015-08-17 – 2015-08-18 (×2): 12.5 mg via ORAL
  Filled 2015-08-16 (×2): qty 1

## 2015-08-16 MED ORDER — NEOSTIGMINE METHYLSULFATE 5 MG/5ML IV SOSY
PREFILLED_SYRINGE | INTRAVENOUS | Status: AC
  Filled 2015-08-16: qty 5

## 2015-08-16 MED ORDER — TRANEXAMIC ACID 1000 MG/10ML IV SOLN
1000.0000 mg | INTRAVENOUS | Status: AC
  Administered 2015-08-16: 1000 mg via INTRAVENOUS
  Filled 2015-08-16: qty 10

## 2015-08-16 MED ORDER — ONDANSETRON HCL 4 MG PO TABS: 4.0000 mg | ORAL_TABLET | Freq: Four times a day (QID) | ORAL | Status: DC | PRN

## 2015-08-16 MED ORDER — DEXTROSE 5 % IV SOLN
10.0000 mg | INTRAVENOUS | Status: DC | PRN
  Administered 2015-08-16: 40 ug/min via INTRAVENOUS

## 2015-08-16 MED ORDER — TRANEXAMIC ACID 1000 MG/10ML IV SOLN
2000.0000 mg | INTRAVENOUS | Status: DC | PRN
  Administered 2015-08-16: 2000 mg via TOPICAL

## 2015-08-16 MED ORDER — ASPIRIN EC 325 MG PO TBEC
325.0000 mg | DELAYED_RELEASE_TABLET | Freq: Every day | ORAL | Status: DC
  Administered 2015-08-17 – 2015-08-18 (×2): 325 mg via ORAL
  Filled 2015-08-16 (×2): qty 1

## 2015-08-16 MED ORDER — BACLOFEN 10 MG PO TABS
10.0000 mg | ORAL_TABLET | Freq: Two times a day (BID) | ORAL | Status: DC | PRN
  Administered 2015-08-17 – 2015-08-18 (×3): 10 mg via ORAL
  Filled 2015-08-16 (×3): qty 1

## 2015-08-16 MED ORDER — ATORVASTATIN CALCIUM 40 MG PO TABS
40.0000 mg | ORAL_TABLET | Freq: Every day | ORAL | Status: DC
  Administered 2015-08-16 – 2015-08-17 (×2): 40 mg via ORAL
  Filled 2015-08-16 (×2): qty 1

## 2015-08-16 MED ORDER — PHENOL 1.4 % MT LIQD: 1.0000 | OROMUCOSAL | Status: DC | PRN

## 2015-08-16 MED ORDER — MIDAZOLAM HCL 5 MG/5ML IJ SOLN
INTRAMUSCULAR | Status: DC | PRN
  Administered 2015-08-16 (×2): 0.5 mg via INTRAVENOUS
  Administered 2015-08-16: 1 mg via INTRAVENOUS

## 2015-08-16 MED ORDER — PANTOPRAZOLE SODIUM 20 MG PO TBEC
20.0000 mg | DELAYED_RELEASE_TABLET | Freq: Every day | ORAL | Status: DC
  Administered 2015-08-17 – 2015-08-18 (×2): 20 mg via ORAL
  Filled 2015-08-16 (×2): qty 1

## 2015-08-16 MED ORDER — FENTANYL CITRATE (PF) 100 MCG/2ML IJ SOLN
25.0000 ug | INTRAMUSCULAR | Status: DC | PRN
  Administered 2015-08-16: 50 ug via INTRAVENOUS

## 2015-08-16 MED ORDER — LEVOTHYROXINE SODIUM 88 MCG PO TABS
88.0000 ug | ORAL_TABLET | Freq: Every day | ORAL | Status: DC
  Administered 2015-08-17 – 2015-08-18 (×2): 88 ug via ORAL
  Filled 2015-08-16 (×2): qty 1

## 2015-08-16 MED ORDER — CHLORHEXIDINE GLUCONATE 4 % EX LIQD
60.0000 mL | Freq: Once | CUTANEOUS | Status: DC
Start: 2015-08-16 — End: 2015-08-16

## 2015-08-16 MED ORDER — TRANEXAMIC ACID 1000 MG/10ML IV SOLN
2000.0000 mg | INTRAVENOUS | Status: DC
  Filled 2015-08-16: qty 20

## 2015-08-16 MED ORDER — METOCLOPRAMIDE HCL 5 MG/ML IJ SOLN
5.0000 mg | Freq: Three times a day (TID) | INTRAMUSCULAR | Status: DC | PRN
  Administered 2015-08-17: 10 mg via INTRAVENOUS
  Filled 2015-08-16: qty 2

## 2015-08-16 MED ORDER — PROPOFOL 10 MG/ML IV BOLUS
INTRAVENOUS | Status: AC
  Filled 2015-08-16: qty 40

## 2015-08-16 MED ORDER — ALBUTEROL SULFATE (2.5 MG/3ML) 0.083% IN NEBU
2.5000 mg | INHALATION_SOLUTION | Freq: Four times a day (QID) | RESPIRATORY_TRACT | Status: DC
  Administered 2015-08-16: 2.5 mg via RESPIRATORY_TRACT
  Filled 2015-08-16: qty 3

## 2015-08-16 MED ORDER — ONDANSETRON HCL 4 MG/2ML IJ SOLN: 4.0000 mg | Freq: Once | INTRAMUSCULAR | Status: DC | PRN

## 2015-08-16 MED ORDER — FENTANYL CITRATE (PF) 100 MCG/2ML IJ SOLN
INTRAMUSCULAR | Status: AC
  Filled 2015-08-16: qty 2

## 2015-08-16 MED ORDER — ACETAMINOPHEN 650 MG RE SUPP: 650.0000 mg | Freq: Four times a day (QID) | RECTAL | Status: DC | PRN

## 2015-08-16 MED ORDER — MIDAZOLAM HCL 2 MG/2ML IJ SOLN
INTRAMUSCULAR | Status: AC
  Filled 2015-08-16: qty 2

## 2015-08-16 MED ORDER — LIDOCAINE 2% (20 MG/ML) 5 ML SYRINGE
INTRAMUSCULAR | Status: AC
  Filled 2015-08-16: qty 5

## 2015-08-16 MED ORDER — ALBUTEROL SULFATE (2.5 MG/3ML) 0.083% IN NEBU: 3.0000 mL | INHALATION_SOLUTION | Freq: Four times a day (QID) | RESPIRATORY_TRACT | Status: DC | PRN

## 2015-08-16 MED ORDER — OXYCODONE HCL 5 MG PO TABS
ORAL_TABLET | ORAL | Status: AC
  Filled 2015-08-16: qty 2

## 2015-08-16 MED ORDER — SUCCINYLCHOLINE CHLORIDE 20 MG/ML IJ SOLN
INTRAMUSCULAR | Status: DC | PRN
  Administered 2015-08-16: 100 mg via INTRAVENOUS

## 2015-08-16 MED ORDER — ACETAMINOPHEN 325 MG PO TABS
650.0000 mg | ORAL_TABLET | Freq: Four times a day (QID) | ORAL | Status: DC | PRN
  Administered 2015-08-17: 650 mg via ORAL
  Filled 2015-08-16: qty 2

## 2015-08-16 MED ORDER — LACTATED RINGERS IV SOLN
INTRAVENOUS | Status: DC
  Administered 2015-08-16 (×4): via INTRAVENOUS

## 2015-08-16 MED ORDER — NEOSTIGMINE METHYLSULFATE 10 MG/10ML IV SOLN
INTRAVENOUS | Status: DC | PRN
  Administered 2015-08-16: 2 mg via INTRAVENOUS

## 2015-08-16 MED ORDER — VENLAFAXINE HCL ER 150 MG PO CP24
300.0000 mg | ORAL_CAPSULE | Freq: Every day | ORAL | Status: DC
  Administered 2015-08-17 – 2015-08-18 (×2): 300 mg via ORAL
  Filled 2015-08-16 (×2): qty 2

## 2015-08-16 MED ORDER — VALSARTAN-HYDROCHLOROTHIAZIDE 320-12.5 MG PO TABS: 1.0000 | ORAL_TABLET | Freq: Every day | ORAL | Status: DC

## 2015-08-16 MED ORDER — ALBUTEROL SULFATE (2.5 MG/3ML) 0.083% IN NEBU
2.5000 mg | INHALATION_SOLUTION | Freq: Four times a day (QID) | RESPIRATORY_TRACT | Status: DC | PRN
  Administered 2015-08-17: 2.5 mg via RESPIRATORY_TRACT
  Filled 2015-08-16: qty 3

## 2015-08-16 MED ORDER — POTASSIUM CHLORIDE IN NACL 20-0.9 MEQ/L-% IV SOLN
INTRAVENOUS | Status: AC
  Administered 2015-08-16: 21:00:00 via INTRAVENOUS
  Filled 2015-08-16: qty 1000

## 2015-08-16 MED ORDER — GABAPENTIN 300 MG PO CAPS
900.0000 mg | ORAL_CAPSULE | Freq: Three times a day (TID) | ORAL | Status: DC
  Administered 2015-08-16 – 2015-08-18 (×5): 900 mg via ORAL
  Filled 2015-08-16 (×5): qty 3

## 2015-08-16 MED ORDER — ROCURONIUM BROMIDE 50 MG/5ML IV SOLN
INTRAVENOUS | Status: AC
  Filled 2015-08-16: qty 1

## 2015-08-16 MED ORDER — CEFAZOLIN SODIUM-DEXTROSE 2-4 GM/100ML-% IV SOLN
2.0000 g | Freq: Four times a day (QID) | INTRAVENOUS | Status: AC
  Administered 2015-08-16 – 2015-08-17 (×2): 2 g via INTRAVENOUS
  Filled 2015-08-16 (×2): qty 100

## 2015-08-16 MED ORDER — METOCLOPRAMIDE HCL 5 MG PO TABS: 5.0000 mg | ORAL_TABLET | Freq: Three times a day (TID) | ORAL | Status: DC | PRN

## 2015-08-16 MED ORDER — GLYCOPYRROLATE 0.2 MG/ML IV SOSY
PREFILLED_SYRINGE | INTRAVENOUS | Status: AC
  Filled 2015-08-16: qty 3

## 2015-08-16 MED ORDER — ONDANSETRON HCL 4 MG/2ML IJ SOLN
INTRAMUSCULAR | Status: DC | PRN
  Administered 2015-08-16: 4 mg via INTRAVENOUS

## 2015-08-16 MED ORDER — IRBESARTAN 300 MG PO TABS
300.0000 mg | ORAL_TABLET | Freq: Every day | ORAL | Status: DC
  Administered 2015-08-17 – 2015-08-18 (×2): 300 mg via ORAL
  Filled 2015-08-16 (×2): qty 1

## 2015-08-16 MED ORDER — MENTHOL 3 MG MT LOZG: 1.0000 | LOZENGE | OROMUCOSAL | Status: DC | PRN

## 2015-08-16 MED ORDER — BUPIVACAINE LIPOSOME 1.3 % IJ SUSP: INTRAMUSCULAR | Status: DC | PRN

## 2015-08-16 MED ORDER — ONDANSETRON HCL 4 MG PO TABS: 4.0000 mg | ORAL_TABLET | Freq: Three times a day (TID) | ORAL | Status: DC | PRN

## 2015-08-16 MED ORDER — BUPIVACAINE LIPOSOME 1.3 % IJ SUSP
20.0000 mL | INTRAMUSCULAR | Status: DC
  Filled 2015-08-16: qty 20

## 2015-08-16 MED ORDER — ONDANSETRON HCL 4 MG/2ML IJ SOLN
4.0000 mg | Freq: Four times a day (QID) | INTRAMUSCULAR | Status: DC | PRN
  Administered 2015-08-16 – 2015-08-17 (×2): 4 mg via INTRAVENOUS
  Filled 2015-08-16 (×2): qty 2

## 2015-08-16 MED ORDER — POLYVINYL ALCOHOL 1.4 % OP SOLN
1.0000 [drp] | Freq: Two times a day (BID) | OPHTHALMIC | Status: DC | PRN
  Filled 2015-08-16: qty 15

## 2015-08-16 MED ORDER — DEXAMETHASONE SODIUM PHOSPHATE 10 MG/ML IJ SOLN
INTRAMUSCULAR | Status: DC | PRN
  Administered 2015-08-16: 8 mg via INTRAVENOUS

## 2015-08-16 MED ORDER — OXYCODONE HCL 5 MG PO TABS
5.0000 mg | ORAL_TABLET | ORAL | Status: DC | PRN
  Administered 2015-08-16 – 2015-08-18 (×11): 10 mg via ORAL
  Filled 2015-08-16 (×10): qty 2

## 2015-08-16 MED ORDER — POLYETHYL GLYCOL-PROPYL GLYCOL 0.4-0.3 % OP SOLN: 3.0000 [drp] | Freq: Two times a day (BID) | OPHTHALMIC | Status: DC | PRN

## 2015-08-16 MED ORDER — SODIUM CHLORIDE 0.9 % IR SOLN
Status: DC | PRN
  Administered 2015-08-16: 3000 mL
  Administered 2015-08-16 (×2): 1000 mL

## 2015-08-16 MED ORDER — HYDROMORPHONE HCL 1 MG/ML IJ SOLN
0.5000 mg | INTRAMUSCULAR | Status: DC | PRN
  Administered 2015-08-16 – 2015-08-17 (×6): 0.5 mg via INTRAVENOUS
  Filled 2015-08-16 (×6): qty 1

## 2015-08-16 MED ORDER — VITAMIN D 1000 UNITS PO TABS
1000.0000 [IU] | ORAL_TABLET | Freq: Every day | ORAL | Status: DC
  Administered 2015-08-16 – 2015-08-17 (×2): 1000 [IU] via ORAL
  Filled 2015-08-16 (×2): qty 1

## 2015-08-16 MED ORDER — LIDOCAINE HCL (CARDIAC) 20 MG/ML IV SOLN
INTRAVENOUS | Status: DC | PRN
  Administered 2015-08-16: 100 mg via INTRAVENOUS

## 2015-08-16 MED ORDER — ONDANSETRON HCL 4 MG/2ML IJ SOLN
INTRAMUSCULAR | Status: AC
  Filled 2015-08-16: qty 2

## 2015-08-16 MED ORDER — MAGNESIUM OXIDE 400 (241.3 MG) MG PO TABS
400.0000 mg | ORAL_TABLET | Freq: Every day | ORAL | Status: DC
  Administered 2015-08-16 – 2015-08-17 (×2): 400 mg via ORAL
  Filled 2015-08-16 (×2): qty 1

## 2015-08-16 MED ORDER — NORMAL SALINE FLUSH 0.9 % IV SOLN: INTRAVENOUS | Status: DC | PRN

## 2015-08-16 SURGICAL SUPPLY — 57 items
BAG DECANTER FOR FLEXI CONT (MISCELLANEOUS) ×5 IMPLANT
CAPT HIP TOTAL 2 ×2 IMPLANT
CELLS DAT CNTRL 66122 CELL SVR (MISCELLANEOUS) ×1 IMPLANT
CLOSURE STERI-STRIP 1/2X4 (GAUZE/BANDAGES/DRESSINGS) ×1
CLSR STERI-STRIP ANTIMIC 1/2X4 (GAUZE/BANDAGES/DRESSINGS) ×1 IMPLANT
COVER SURGICAL LIGHT HANDLE (MISCELLANEOUS) ×3 IMPLANT
DRAPE C-ARM 42X72 X-RAY (DRAPES) ×3 IMPLANT
DRAPE STERI IOBAN 125X83 (DRAPES) ×3 IMPLANT
DRAPE U-SHAPE 47X51 STRL (DRAPES) ×9 IMPLANT
DRSG AQUACEL AG ADV 3.5X10 (GAUZE/BANDAGES/DRESSINGS) ×3 IMPLANT
DURAPREP 26ML APPLICATOR (WOUND CARE) ×3 IMPLANT
ELECT BLADE 4.0 EZ CLEAN MEGAD (MISCELLANEOUS) ×3
ELECT REM PT RETURN 9FT ADLT (ELECTROSURGICAL) ×3
ELECTRODE BLDE 4.0 EZ CLN MEGD (MISCELLANEOUS) ×1 IMPLANT
ELECTRODE REM PT RTRN 9FT ADLT (ELECTROSURGICAL) ×1 IMPLANT
GLOVE SKINSENSE NS SZ7.5 (GLOVE) ×2
GLOVE SKINSENSE STRL SZ7.5 (GLOVE) ×1 IMPLANT
GLOVE SURG SYN 7.5  E (GLOVE) ×8
GLOVE SURG SYN 7.5 E (GLOVE) ×4 IMPLANT
GLOVE SURG SYN 7.5 PF PI (GLOVE) ×2 IMPLANT
GOWN SRG XL XLNG 56XLVL 4 (GOWN DISPOSABLE) ×1 IMPLANT
GOWN STRL NON-REIN XL XLG LVL4 (GOWN DISPOSABLE) ×3
GOWN STRL REUS W/ TWL LRG LVL3 (GOWN DISPOSABLE) IMPLANT
GOWN STRL REUS W/TWL LRG LVL3 (GOWN DISPOSABLE) ×6
HANDPIECE INTERPULSE COAX TIP (DISPOSABLE) ×3
HOOD PEEL AWAY FLYTE STAYCOOL (MISCELLANEOUS) ×6 IMPLANT
IV NS 1000ML (IV SOLUTION) ×3
IV NS 1000ML BAXH (IV SOLUTION) ×1 IMPLANT
IV NS IRRIG 3000ML ARTHROMATIC (IV SOLUTION) ×3 IMPLANT
KIT BASIN OR (CUSTOM PROCEDURE TRAY) ×3 IMPLANT
MARKER SKIN DUAL TIP RULER LAB (MISCELLANEOUS) ×3 IMPLANT
NDL SPNL 18GX3.5 QUINCKE PK (NEEDLE) ×1 IMPLANT
NEEDLE SPNL 18GX3.5 QUINCKE PK (NEEDLE) ×3 IMPLANT
PACK TOTAL JOINT (CUSTOM PROCEDURE TRAY) ×3 IMPLANT
PACK UNIVERSAL I (CUSTOM PROCEDURE TRAY) ×3 IMPLANT
PIN SECTOR W/GRIP ACE CUP 52MM (Hips) ×2 IMPLANT
RETRACTOR WND ALEXIS 18 MED (MISCELLANEOUS) ×1 IMPLANT
RTRCTR WOUND ALEXIS 18CM MED (MISCELLANEOUS) ×3
SAW OSC TIP CART 19.5X105X1.3 (SAW) ×3 IMPLANT
SCRUB BETADINE 4OZ XXX (MISCELLANEOUS) ×2 IMPLANT
SEALER BIPOLAR AQUA 6.0 (INSTRUMENTS) ×3 IMPLANT
SET HNDPC FAN SPRY TIP SCT (DISPOSABLE) ×1 IMPLANT
SPONGE LAP 18X18 X RAY DECT (DISPOSABLE) ×2 IMPLANT
STAPLER VISISTAT 35W (STAPLE) IMPLANT
SUT ETHIBOND 2 V 37 (SUTURE) ×1 IMPLANT
SUT ETHIBOND NAB CT1 #1 30IN (SUTURE) ×3 IMPLANT
SUT ETHILON 3 0 PS 1 (SUTURE) ×2 IMPLANT
SUT MNCRL AB 3-0 PS2 18 (SUTURE) ×2 IMPLANT
SUT VIC AB 1 CT1 27 (SUTURE) ×30
SUT VIC AB 1 CT1 27XBRD ANBCTR (SUTURE) ×1 IMPLANT
SUT VIC AB 2-0 CT1 27 (SUTURE)
SUT VIC AB 2-0 CT1 TAPERPNT 27 (SUTURE) ×1 IMPLANT
SYR 20CC LL (SYRINGE) ×5 IMPLANT
SYR 50ML LL SCALE MARK (SYRINGE) ×3 IMPLANT
TOWEL OR 17X26 10 PK STRL BLUE (TOWEL DISPOSABLE) ×3 IMPLANT
TRAY CATH 16FR W/PLASTIC CATH (SET/KITS/TRAYS/PACK) ×1 IMPLANT
YANKAUER SUCT BULB TIP NO VENT (SUCTIONS) ×5 IMPLANT

## 2015-08-16 NOTE — Anesthesia Postprocedure Evaluation (Signed)
Anesthesia Post Note  Patient: Jacqueline Lucas  Procedure(s) Performed: Procedure(s) (LRB): TOTAL HIP ARTHROPLASTY ANTERIOR APPROACH (Right)  Patient location during evaluation: PACU Anesthesia Type: General Level of consciousness: awake and alert Pain management: pain level controlled Vital Signs Assessment: post-procedure vital signs reviewed and stable Respiratory status: spontaneous breathing, nonlabored ventilation, respiratory function stable and patient connected to nasal cannula oxygen Cardiovascular status: blood pressure returned to baseline and stable Postop Assessment: no signs of nausea or vomiting Anesthetic complications: no    Last Vitals:  Filed Vitals:   08/16/15 1730 08/16/15 1745  BP: 129/66   Pulse: 93 98  Temp: 36.8 C   Resp: 16 13    Last Pain:  Filed Vitals:   08/16/15 1745  PainSc: 8                  Catalina Gravel

## 2015-08-16 NOTE — Transfer of Care (Signed)
Immediate Anesthesia Transfer of Care Note  Patient: MELONIA HAW  Procedure(s) Performed: Procedure(s): TOTAL HIP ARTHROPLASTY ANTERIOR APPROACH (Right)  Patient Location: PACU  Anesthesia Type:General  Level of Consciousness: sedated  Airway & Oxygen Therapy: Patient Spontanous Breathing and Patient connected to nasal cannula oxygen  Post-op Assessment: Report given to RN and Post -op Vital signs reviewed and stable  Post vital signs: Reviewed and stable  Last Vitals:  Filed Vitals:   08/16/15 1007  BP: 160/74  Pulse: 83  Temp: 36.9 C  Resp: 18    Last Pain:  Filed Vitals:   08/16/15 1731  PainSc: 6          Complications: No apparent anesthesia complications

## 2015-08-16 NOTE — Anesthesia Procedure Notes (Signed)
Procedure Name: Intubation Date/Time: 08/16/2015 1:38 PM Performed by: Williemae Area B Pre-anesthesia Checklist: Patient identified, Emergency Drugs available, Suction available and Patient being monitored Patient Re-evaluated:Patient Re-evaluated prior to inductionOxygen Delivery Method: Circle system utilized Preoxygenation: Pre-oxygenation with 100% oxygen Intubation Type: IV induction Ventilation: Mask ventilation without difficulty Laryngoscope Size: Mac and 3 Grade View: Grade II Tube type: Oral Tube size: 7.5 mm Number of attempts: 1 Airway Equipment and Method: Stylet Secured at: 21 (cm at teeth) cm Tube secured with: Tape Dental Injury: Teeth and Oropharynx as per pre-operative assessment

## 2015-08-16 NOTE — Brief Op Note (Signed)
08/16/2015  5:32 PM  PATIENT:  Jacqueline Lucas  65 y.o. female  PRE-OPERATIVE DIAGNOSIS:  RIGHT HIP OSTEOARTHRITIS  POST-OPERATIVE DIAGNOSIS:  RIGHT HIP OSTEOARTHRITIS  PROCEDURE:  Procedure(s): TOTAL HIP ARTHROPLASTY ANTERIOR APPROACH  SURGEON:  Surgeon(s): Meredith Pel, MD Naiping Ephriam Jenkins, MD  ASSISTANT: Juanna Cao MD  ANESTHESIA:   general  EBL: 400 ml    Total I/O In: 2000 [I.V.:2000] Out: 400 [Blood:400]  BLOOD ADMINISTERED: none  DRAINS: none   LOCAL MEDICATIONS USED:  none  SPECIMEN:  No Specimen  COUNTS:  YES  TOURNIQUET:  * No tourniquets in log *  DICTATION: .Other Dictation: Dictation Number done  PLAN OF CARE: Admit to inpatient   PATIENT DISPOSITION:  PACU - hemodynamically stable

## 2015-08-16 NOTE — H&P (Signed)
TOTAL HIP ADMISSION H&P  Patient is admitted for right total hip arthroplasty.  Subjective:  Chief Complaint: right hip pain  HPI: Jacqueline Lucas, 65 y.o. female, has a history of pain and functional disability in the right hip(s) due to arthritis and patient has failed non-surgical conservative treatments for greater than 12 weeks to include NSAID's and/or analgesics, corticosteriod injections, use of assistive devices and activity modification.  Onset of symptoms was gradual starting 6 years ago with gradually worsening course since that time.The patient noted no past surgery on the right hip(s).  Patient currently rates pain in the right hip at 9 out of 10 with activity. Patient has night pain, worsening of pain with activity and weight bearing, trendelenberg gait, pain that interfers with activities of daily living and pain with passive range of motion. Patient has evidence of subchondral sclerosis and joint space narrowing by imaging studies. This condition presents safety issues increasing the risk of falls. This patient has had good result with left total hip replacement done through posterior approach.  There is no current active infection.  I did discuss with her dentist her current state of oral care.  No current infectious issues present at this time  Patient Active Problem List   Diagnosis Date Noted  . Microscopic hematuria 07/30/2015  . Vaginal atrophy 07/30/2015  . GAD (generalized anxiety disorder) 07/08/2015  . Moderate episode of recurrent major depressive disorder ([place]) 07/08/2015  . Migraine with aura and without status migrainosus, not intractable 07/08/2015  . Dyslipidemia 07/08/2015  . Degenerative arthritis of right knee 03/16/2015  . Atrophic vaginitis 01/31/2015  . Recurrent UTI 12/31/2014  . IBS (irritable bowel syndrome) 10/29/2014  . Asthma, moderate persistent 09/08/2014  . Allergic state 09/07/2014  . Colon polyp 09/07/2014  . Hemorrhoid 09/07/2014  .  Constrictive tenosynovitis 03/12/2014  . H/O total knee replacement 07/31/2013  . Angina pectoris ([place]) 11/28/2012  . Anxiety 11/28/2012  . Acid reflux 11/28/2012  . Cardiac murmur 11/28/2012  . BP (high blood pressure) 11/28/2012  . Cannot sleep 11/28/2012  . Adiposity 11/28/2012  . Arthritis, degenerative 11/28/2012  . Restless leg 11/28/2012  . Supraventricular tachycardia ([place]) 11/28/2012  . Fibromyalgia 11/28/2012   Past Medical History  Diagnosis Date  . Hypertension   . Asthma   . Hyperlipidemia   . Thyroid disease   . Bronchitis   . Lumbar neuritis   . Shoulder fracture, right   . Anxiety   . Arthritis   . Allergy   . Cataract   . Depression   . GERD (gastroesophageal reflux disease)   . Insomnia   . Migraines   . Chronic nausea   . Alzheimer disease   . Obesity   . Tachycardia   . Anginal pain ([place])   . Palpitation   . Fibromyalgia   . Heart murmur   . Hematuria   . IBS (irritable bowel syndrome)   . PONV (postoperative nausea and vomiting)   . Claustrophobia   . H/O bladder infections   . Allergy to adhesive tape     Allergy to paper tape as well  . Dry eyes     uses Restasis  . Hypothyroidism   . Anemia     Past Surgical History  Procedure Laterality Date  . Replacement total knee Left   . Total hip arthroplasty Left 2013  . Hemorrhoid surgery    . Abdominal hysterectomy      due to cancer of ovaries  . Spine surgery  x 2  . Tendonititis      right elbow, right wrist  . Back surgery N/A 2014    x 5  . Colonoscopy w/ polypectomy    . Cardiac catheterization      no PCI; 12/18/12 ([facility], Dr. Marcello Moores): EP study with ablation. No coronary angiography done then.  . Total knee arthroplasty Right 03/16/2015    Procedure: RIGHT TOTAL KNEE ARTHROPLASTY, PCL SACRIFICING;  Surgeon: Meredith Pel, MD;  Location: [facility];  Service: Orthopedics;  Laterality: Right;  . Joint replacement Bilateral     knee    Facility-administered medications  prior to admission  Medication Dose Route Frequency Provider Last Rate Last Dose  . albuterol (PROVENTIL) (2.5 MG/3ML) 0.083% nebulizer solution 2.5 mg  2.5 mg Nebulization Once Ashok Norris, MD       Prescriptions prior to admission  Medication Sig Dispense Refill Last Dose  . acetaminophen (TYLENOL) 500 MG tablet Take 1,000 mg by mouth 2 (two) times daily.   Past Month at Unknown time  . albuterol (PROAIR HFA) 108 (90 BASE) MCG/ACT inhaler Inhale 2 puffs into the lungs every 6 (six) hours as needed for wheezing or shortness of breath. 1 Inhaler 1 Past Month at Unknown time  . aspirin EC 325 MG tablet Take 325 mg by mouth at bedtime.   08/09/2015  . atorvastatin (LIPITOR) 40 MG tablet Take 1 tablet (40 mg total) by mouth at bedtime. 90 tablet 1 08/15/2015 at Unknown time  . baclofen (LIORESAL) 10 MG tablet Take 10 mg by mouth 2 (two) times daily as needed for muscle spasms. Reported on 07/28/2015   Past Week at Unknown time  . Cholecalciferol (D3-1000) 1000 units tablet Take 1,000 Units by mouth at bedtime.    Past Week at Unknown time  . cycloSPORINE (RESTASIS) 0.05 % ophthalmic emulsion Place 1 drop into both eyes 2 (two) times daily.    08/16/2015 at Unknown time  . diclofenac sodium (VOLTAREN) 1 % GEL Apply 1 application topically 3 (three) times daily as needed (pain/ fibromyalgia).    Past Week at Unknown time  . estradiol (ESTRACE) 0.1 MG/GM vaginal cream Apply 0.5mg  (pea-sized amount)  just inside the vaginal introitus with a finger-tip every night for two weeks and then Monday, Wednesday and Friday nights.  adverse systemic effects that oral HT. (Patient taking differently: Place 1 Applicatorful vaginally every 3 (three) days. Apply 0.5mg  (pea-sized amount)  just inside the vaginal introitus with a finger-tip every 3rd night -  adverse systemic effects that oral HT.) 127.5 g 3 Past Week at Unknown time  . gabapentin (NEURONTIN) 300 MG capsule Take 900 mg by mouth 3 (three) times daily.     08/16/2015 at 0900  . lansoprazole (PREVACID) 30 MG capsule Take 1 capsule (30 mg total) by mouth 2 (two) times daily before a meal. 180 capsule 3 08/16/2015 at Unknown time  . levalbuterol (XOPENEX) 1.25 MG/3ML nebulizer solution Take 1.25 mg by nebulization every 4 (four) hours as needed for wheezing. 72 mL 4 Past Month at Unknown time  . levothyroxine (SYNTHROID) 88 MCG tablet Take 88 mcg by mouth daily before breakfast.    08/16/2015 at Unknown time  . magnesium oxide (MAG-OX) 400 MG tablet Take 400 mg by mouth at bedtime. Reported on 07/28/2015   Past Week at Unknown time  . mometasone-formoterol (DULERA) 200-5 MCG/ACT AERO Inhale 2 puffs into the lungs 2 (two) times daily. Reported on 07/28/2015   08/15/2015 at Unknown time  . montelukast (  SINGULAIR) 10 MG tablet Take 1 tablet (10 mg total) by mouth at bedtime. 80 tablet 3 08/15/2015 at Unknown time  . ondansetron (ZOFRAN) 4 MG tablet Take 1 tablet (4 mg total) by mouth every 8 (eight) hours as needed for nausea or vomiting. 20 tablet 0 Past Month at Unknown time  . Polyethyl Glycol-Propyl Glycol 0.4-0.3 % SOLN Place 3 drops into both eyes 2 (two) times daily as needed (dry eyes). Reported on 07/28/2015   Past Month at Unknown time  . valsartan-hydrochlorothiazide (DIOVAN HCT) 320-12.5 MG tablet Take 1 tablet by mouth daily. 90 tablet 1 08/16/2015 at Unknown time  . vitamin B-12 (CYANOCOBALAMIN) 1000 MCG tablet Take 1,000 mcg by mouth at bedtime.   Past Week at Unknown time  . amoxicillin (AMOXIL) 500 MG capsule Take 2,000 mg by mouth See admin instructions. Take 4 capsules (2000 mg) by mouth one hour prior to dental appointment   More than a month at Unknown time  . ciprofloxacin (CIPRO) 500 MG tablet    completed  . clonazePAM (KLONOPIN) 0.5 MG tablet Take 1 tablet (0.5 mg total) by mouth daily as needed for anxiety. 30 tablet 0 Taking  . oxyCODONE (OXY IR/ROXICODONE) 5 MG immediate release tablet Take 1-2 tablets (5-10 mg total) by mouth every 3  (three) hours as needed for breakthrough pain. (Patient taking differently: Take 5 mg by mouth every 3 (three) hours as needed for breakthrough pain (migraine). ) 60 tablet 0 More than a month at Unknown time  . venlafaxine XR (EFFEXOR-XR) 150 MG 24 hr capsule Take 1 capsule (150 mg total) by mouth daily with breakfast. (Patient taking differently: Take 300 mg by mouth daily with breakfast. ) 90 capsule 0    Allergies  Allergen Reactions  . Morphine Nausea And Vomiting    Ok with nausea medication  . Diazepam Nausea And Vomiting  . Rash Away  [Petrolatum-Zinc Oxide] Rash and Swelling  . Salicylates Other (See Comments)    Other reaction(s): Headache  . Tape Rash    Please use paper tape    Social History  Substance Use Topics  . Smoking status: Never Smoker   . Smokeless tobacco: Never Used  . Alcohol Use: No    Family History  Problem Relation Age of Onset  . Diabetes Mother   . Hypertension Mother   . Heart disease Mother   . Diabetes Father   . Cancer Father   . Hypertension Sister   . Depression Sister   . Cancer Brother   . Bladder Cancer Neg Hx   . Kidney disease Neg Hx      Review of Systems  Constitutional: Negative.   HENT: Negative.   Eyes: Negative.   Respiratory: Negative.   Cardiovascular: Negative.   Gastrointestinal: Negative.   Genitourinary: Negative.   Musculoskeletal: Positive for joint pain.  Skin: Negative.   Neurological: Negative.   Endo/Heme/Allergies: Negative.   Psychiatric/Behavioral: Negative.     Objective:  Physical Exam  Constitutional: She appears well-developed.  HENT:  Head: Normocephalic.  Eyes: Pupils are equal, round, and reactive to light.  Neck: Normal range of motion.  Cardiovascular: Normal rate.   Respiratory: Effort normal.  Neurological: She is alert.  Skin: Skin is warm.  Psychiatric: She has a normal mood and affect.  Right hip demonstrates equal leg lengths good ankle dorsi and plantar flexion strength pain  with internal/external rotation no trochanteric tenderness is noted no other masses lymph adenopathy or skin changes noted in the right  hip region  Vital signs in last 24 hours: Temp:  [98.4 F (36.9 C)] 98.4 F (36.9 C) (06/19 1007) Pulse Rate:  [83] 83 (06/19 1007) Resp:  [18] 18 (06/19 1007) BP: (160)/(74) 160/74 mmHg (06/19 1007) SpO2:  [97 %] 97 % (06/19 1007) Weight:  [89.767 kg (197 lb 14.4 oz)] 89.767 kg (197 lb 14.4 oz) (06/19 1007)  Labs:   Estimated body mass index is 36.19 kg/(m^2) as calculated from the following:   Height as of this encounter: 5\' 2"  (1.575 m).   Weight as of this encounter: 89.767 kg (197 lb 14.4 oz).   Imaging Review Plain radiographs demonstrate moderate degenerative joint disease of the left hip(s). The bone quality appears to be good for age and reported activity level.  Assessment/Plan:  End stage arthritis, right hip(s)  The patient history, physical examination, clinical judgement of the provider and imaging studies are consistent with end stage degenerative joint disease of the right hip(s) and total hip arthroplasty is deemed medically necessary. The treatment options including medical management, injection therapy, arthroscopy and arthroplasty were discussed at length. The risks and benefits of total hip arthroplasty were presented and reviewed. The risks due to aseptic loosening, infection, stiffness, dislocation/subluxation,  thromboembolic complications and other imponderables were discussed.  The patient acknowledged the explanation, agreed to proceed with the plan and consent was signed. Patient is being admitted for inpatient treatment for surgery, pain control, PT, OT, prophylactic antibiotics, VTE prophylaxis, progressive ambulation and ADL's and discharge planning.The patient is planning to be discharged home with home health services

## 2015-08-16 NOTE — Op Note (Signed)
NAMEMarland Kitchen  KIRSTI, DELEEUW NO.:  [id number]  MEDICAL RECORD NO.:  [id number]  LOCATION:  5N13C                        FACILITY:  [facility]  PHYSICIAN:  Anderson Malta, M.D.    DATE OF BIRTH:  [birth year]  DATE OF PROCEDURE: DATE OF DISCHARGE:                              OPERATIVE REPORT   PREOPERATIVE DIAGNOSIS:  Right hip arthritis.  POSTOPERATIVE DIAGNOSIS:  Right hip arthritis.  PROCEDURE:  Right total hip replacement, anterior approach, DePuy Corail stem, Press-fit, 36 ceramic head, 54 cup with 2 screws, +4 mm offset liner.  SURGEON:  Anderson Malta, M.D.  ASSISTANT:  Laure Kidney, RNFA.  INDICATION:  Jacqueline Lucas is a 65 year old patient, right hip arthritis, presents for operative management after explanation of risks and benefits.  PROCEDURE IN DETAIL:  The patient was brought to the operating room where general anesthetic was induced.  Preoperative antibiotics were administered.  Time-out was called.  The patient was placed on the Hana bed.  Right hip was prescrubbed with alcohol and Betadine, allowed to air dry, prepped with DuraPrep solution and draped in a sterile manner. Charlie Pitter was used to cover the operative field.  The patient was prepped and draped.  Preoperative fluoroscopic examination demonstrated offset leg lengths.  The patient had a left total hip replacement.  At this time, time-out was called.  Incision was made 2 cm lateral and distal to the anterior superior iliac crest.  Incision was about 10 cm, directed toward the lateral condyle.  Skin and subcutaneous tissue were sharply divided.  Overlying fascia lata was identified.  The self-retaining retractor was placed.  Care was taken to avoid injury to the lateral femoral cutaneous nerve.  The fascia over the TFL was divided, blunt dissection performed between the TFL and rectus.  The ascending circumflex vessels were cauterized.  Retractor placed around the superior femoral neck and then after  cauterization of the ascending vessels.  Second Cobra was placed along the inferior neck.  Pericapsular fat was removed.  At this time, capsulotomy was performed.  Each end was tagged.  The leg was externally rotated and the dissection was taken down to the lesser.  At this time, retractors were placed on the inside portion of the capsule.  Using fluoroscopic guidance, the neck cut was performed.  This was done with an oscillating saw and then completed with an osteotome.  Head was removed.  Measured about 45.  At this time, the right angle acetabular retractor placed from the acetabulum between the labrum and the capsule.  In a similar manner, posterior retractor placed.  Labrum was excised.  At this time, pulvinar was also excised, some of the inferior capsule was released in order to facilitate placement of the reamers.  Initially, 42 reamer was taken, which flatten the phobia.  Then, sequential reaming was taken up to 52-51 mm. Initially, 52 cup was placed, but that was not tight enough even though under reaming by 1 mm.  The cup was placed in approximately 45 degrees of abduction and about 10 degrees of anteversion.  The cup was then reamed to 52 and 54 cup was placed with good purchase obtained.  Two screws were placed.  No further medialization  was performed.  At this time, liner was placed.  Then, the retractor was placed around the trochanter and the leg was taken, adducted and extended position.  With the proximal femur exposed, the lift was applied in order to facilitate upward mobility of the femur.  The conjoint tendon was released to facilitate the procedure.  The femur was then broached until an excellent press-fit was obtained.  This was checked under fluoroscopy and found to have very good fit.  Co-planning of the neck cut was performed.  Trial ball was placed with an 8.5 mm of neck length.  The 8.5 gave superior stability with external rotation to 90 degrees and  to extension 45.  These were the component selected after thorough irrigation.  Same stability parameters were maintained.  Leg lengths approximately equal when viewed from the AP pelvis under fluoroscopy. Thorough irrigation was then performed.  Capsule reapproximated, #1 Vicryl suture was then utilized to close the tensor fascia lata, then 0 Vicryl suture, 2-0 Vicryl suture and a 3-0 Monocryl was utilized. Aquacel dressing placed.  Leg length was approximately equal at the conclusion of the case.  The patient was transferred to the recovery room in stable condition.     Anderson Malta, M.D.     GSD/MEDQ  D:  08/16/2015  T:  08/16/2015  Job:  [id number]

## 2015-08-16 NOTE — Anesthesia Preprocedure Evaluation (Addendum)
Anesthesia Evaluation  Patient identified by MRN, date of birth, ID band Patient awake    Reviewed: Allergy & Precautions, NPO status , Patient's Chart, lab work & pertinent test results  History of Anesthesia Complications (+) PONV and history of anesthetic complications  Airway Mallampati: II  TM Distance: >3 FB Neck ROM: Full    Dental  (+) Teeth Intact, Dental Advisory Given, Partial Lower, Missing   Pulmonary asthma ,  Sporadic use of inhalers...   Pulmonary exam normal breath sounds clear to auscultation       Cardiovascular hypertension, Pt. on medications + angina Normal cardiovascular exam+ dysrhythmias (PSVT s/p ablation 12/18/12)  Rhythm:Regular Rate:Normal  03/12/15 EKG: SR with marked sinus arrhythmia, incomplete right BBB. No significant changes since 07/02/13.  12/18/12 EP study/ablation (see Care Everywhere): Conclusions: 1. Typical AVNRT s/p slow pathway modification without ability to re-induce this tachycardia after ablation 2. Normal conduction intervals before and after ablation 3. Atrial fibrillation was induced during this study and was sustained requiring cardioversion 4. Routine post procedure care  According to cardiology notes by Dr. Marcello Moores in Care Everywhere: Echo 10/2012 EF 60% mild MR TR, LA 4.3, RV normal Exercise stress 10/2012 EF 60%no ekg or perfusion abnormalities.   Neuro/Psych  Headaches, PSYCHIATRIC DISORDERS Anxiety Depression  Neuromuscular disease    GI/Hepatic Neg liver ROS, GERD  Medicated and Controlled,Chronic nausea   Endo/Other  Hypothyroidism Obesity   Renal/GU negative Renal ROS     Musculoskeletal  (+) Arthritis , Osteoarthritis,  Fibromyalgia -, narcotic dependentHas been taking only Tylenol for her hip pain   Abdominal   Peds  Hematology  (+) Blood dyscrasia, anemia ,   Anesthesia Other Findings Day of surgery medications reviewed with the patient.   Reproductive/Obstetrics                       Anesthesia Physical Anesthesia Plan  ASA: III  Anesthesia Plan: General   Post-op Pain Management:    Induction: Intravenous  Airway Management Planned: Oral ETT  Additional Equipment:   Intra-op Plan:   Post-operative Plan: Extubation in OR  Informed Consent: I have reviewed the patients History and Physical, chart, labs and discussed the procedure including the risks, benefits and alternatives for the proposed anesthesia with the patient or authorized representative who has indicated his/her understanding and acceptance.   Dental advisory given  Plan Discussed with: CRNA  Anesthesia Plan Comments: (Risks/benefits of general anesthesia discussed with patient including risk of damage to teeth, lips, gum, and tongue, nausea/vomiting, allergic reactions to medications, and the possibility of heart attack, stroke and death.  All patient questions answered.  Patient wishes to proceed.)        Anesthesia Quick Evaluation

## 2015-08-17 ENCOUNTER — Encounter (HOSPITAL_COMMUNITY): Payer: Self-pay | Admitting: Orthopedic Surgery

## 2015-08-17 LAB — BASIC METABOLIC PANEL
Anion gap: 7 (ref 5–15)
BUN: 8 mg/dL (ref 6–20)
CO2: 27 mmol/L (ref 22–32)
Calcium: 8.7 mg/dL — ABNORMAL LOW (ref 8.9–10.3)
Chloride: 104 mmol/L (ref 101–111)
Creatinine, Ser: 0.74 mg/dL (ref 0.44–1.00)
GFR calc Af Amer: >60 mL/min (ref >60)
GFR calc non Af Amer: >60 mL/min (ref >60)
Glucose, Bld: 177 mg/dL — ABNORMAL HIGH (ref 65–99)
Potassium: 3.7 mmol/L (ref 3.5–5.1)
Sodium: 138 mmol/L (ref 135–145)

## 2015-08-17 LAB — CBC
HCT: 29.9 % — ABNORMAL LOW (ref 36.0–46.0)
Hemoglobin: 9.3 g/dL — ABNORMAL LOW (ref 12.0–15.0)
MCH: 25.8 pg — ABNORMAL LOW (ref 26.0–34.0)
MCHC: 31.1 g/dL (ref 30.0–36.0)
MCV: 83.1 fL (ref 78.0–100.0)
Platelets: 224 10*3/uL (ref 150–400)
RBC: 3.6 MIL/uL — ABNORMAL LOW (ref 3.87–5.11)
RDW: 17 % — ABNORMAL HIGH (ref 11.5–15.5)
WBC: 13.2 10*3/uL — ABNORMAL HIGH (ref 4.0–10.5)

## 2015-08-17 MED ORDER — CETYLPYRIDINIUM CHLORIDE 0.05 % MT LIQD
7.0000 mL | Freq: Two times a day (BID) | OROMUCOSAL | Status: DC
  Administered 2015-08-17 – 2015-08-18 (×2): 7 mL via OROMUCOSAL

## 2015-08-17 MED FILL — Sodium Chloride Flush IV Soln 0.9%: INTRAVENOUS | Qty: 20 | Status: AC

## 2015-08-17 NOTE — Evaluation (Signed)
Physical Therapy Evaluation Patient Details Name: Jacqueline Lucas MRN: [id number] DOB: [birth year] Today's Date: 08/17/2015   History of Present Illness  65 y.o. female now s/p Rt direct anterior THA. PMH: depression, alzheimer, tachycardia, fibromyalgia, angina, bilateral TKA, Lt THA, spine surgery X2.   Clinical Impression  Pt is s/p Rt direct anterior THA resulting in the deficits listed below (see PT Problem List). Pt able to ambulate 75 ft with rw and min guard assist. Pt will benefit from skilled PT to increase their independence and safety with mobility to allow discharge to home with family support.      Follow Up Recommendations Home health PT;Supervision for mobility/OOB    Equipment Recommendations  None recommended by PT    Recommendations for Other Services       Precautions / Restrictions Precautions Precautions: Fall Precaution Comments: no hip precautions Restrictions Weight Bearing Restrictions: Yes RLE Weight Bearing: Weight bearing as tolerated      Mobility  Bed Mobility Overal bed mobility: Needs Assistance Bed Mobility: Sit to Supine       Sit to supine: Min assist   General bed mobility comments: min assist provided with Rt LE  Transfers Overall transfer level: Needs assistance Equipment used: Rolling walker (2 wheeled) Transfers: Sit to/from Stand Sit to Stand: Min guard         General transfer comment: consistent use of UEs.   Ambulation/Gait Ambulation/Gait assistance: Min guard Ambulation Distance (Feet): 75 Feet Assistive device: Rolling walker (2 wheeled) Gait Pattern/deviations: Step-to pattern;Decreased weight shift to right Gait velocity: slow pattern   General Gait Details: cues for pattern, decreased pain with Rt LE lead.   Stairs            Wheelchair Mobility    Modified Rankin (Stroke Patients Only)       Balance Overall balance assessment: Needs assistance Sitting-balance support: No upper extremity  supported Sitting balance-Leahy Scale: Good     Standing balance support: Bilateral upper extremity supported Standing balance-Leahy Scale: Poor Standing balance comment: using rw                             Pertinent Vitals/Pain Pain Assessment: 0-10 Pain Score: 10-Worst pain ever Pain Location: all over, thinks related to fibromyalgia Pain Descriptors / Indicators: Aching Pain Intervention(s): Monitored during session;Limited activity within patient's tolerance;RN gave pain meds during session    Gerald expects to be discharged to:: Private residence Living Arrangements: Children Available Help at Discharge: Family;Available 24 hours/day Type of Home: House Home Access: Level entry     Home Layout: One level Home Equipment: Walker - 2 wheels;Wheelchair - manual;Shower seat - built in;Toilet riser Additional Comments: will have son and daughter staying with her at home. Spouse is in the hospital.     Prior Function Level of Independence: Independent               Hand Dominance        Extremity/Trunk Assessment   Upper Extremity Assessment: Defer to OT evaluation           Lower Extremity Assessment: RLE deficits/detail RLE Deficits / Details: assist needed for getting in/out of bed.        Communication   Communication: No difficulties  Cognition Arousal/Alertness: Awake/alert Behavior During Therapy: WFL for tasks assessed/performed Overall Cognitive Status: Within Functional Limits for tasks assessed  General Comments      Exercises        Assessment/Plan    PT Assessment Patient needs continued PT services  PT Diagnosis Difficulty walking   PT Problem List Decreased strength;Decreased range of motion;Decreased activity tolerance;Decreased balance;Decreased mobility  PT Treatment Interventions DME instruction;Gait training;Stair training;Functional mobility training;Therapeutic  activities;Therapeutic exercise;Balance training;Patient/family education   PT Goals (Current goals can be found in the Care Plan section) Acute Rehab PT Goals Patient Stated Goal: get home PT Goal Formulation: With patient Time For Goal Achievement: 08/31/15 Potential to Achieve Goals: Good    Frequency 7X/week   Barriers to discharge        Co-evaluation               End of Session Equipment Utilized During Treatment: Gait belt Activity Tolerance: Patient tolerated treatment well Patient left: in bed;with call bell/phone within reach;with SCD's reapplied Nurse Communication: Mobility status;Weight bearing status         Time: [id number] PT Time Calculation (min) (ACUTE ONLY): 24 min   Charges:   PT Evaluation $PT Eval Moderate Complexity: 1 Procedure PT Treatments $Gait Training: 8-22 mins   PT G Codes:        Cassell Clement, PT, CSCS Pager [phone] Office [phone]  08/17/2015, 11:34 AM

## 2015-08-17 NOTE — Progress Notes (Signed)
Subjective: Pt stable - walking in halls  Objective: Vital signs in last 24 hours: Temp:  [98 F (36.7 C)-98.4 F (36.9 C)] 98.4 F (36.9 C) (06/19 2009) Pulse Rate:  [76-102] 102 (06/20 0500) Resp:  [11-16] 14 (06/20 0500) BP: (119-157)/(43-71) 129/43 mmHg (06/20 0500) SpO2:  [91 %-100 %] 96 % (06/20 0917)  Intake/Output from previous day: 06/19 0701 - 06/20 0700 In: 3565 [P.O.:540; I.V.:2825; IV Piggyback:200] Out: 400 [Blood:400] Intake/Output this shift:    Exam:  Sensation intact distally Intact pulses distally  Labs:  Recent Labs  08/17/15 0306  HGB 9.3*    Recent Labs  08/17/15 0306  WBC 13.2*  RBC 3.60*  HCT 29.9*  PLT 224    Recent Labs  08/17/15 0306  NA 138  K 3.7  CL 104  CO2 27  BUN 8  CREATININE 0.74  GLUCOSE 177*  CALCIUM 8.7*   No results for input(s): LABPT, INR in the last 72 hours.  Assessment/Plan: Plan for pt today and tomorrow - possible dc am   Cloie Wooden SCOTT 08/17/2015, 11:13 AM

## 2015-08-17 NOTE — Progress Notes (Signed)
Physical Therapy Treatment Patient Details Name: Jacqueline Lucas MRN: [id number] DOB: [birth year] Today's Date: 08/17/2015    History of Present Illness 65 y.o. female now s/p Rt direct anterior THA. PMH: depression, alzheimer, tachycardia, fibromyalgia, angina, bilateral TKA, Lt THA, spine surgery X2.     PT Comments    Pt making steady progress with mobility, able to ambulate 160 ft with rw and min guard assistance. Continue to anticipate D/C to SNF following acute stay. PT to follow and progress mobility as tolertated.   Follow Up Recommendations  Home health PT;Supervision for mobility/OOB     Equipment Recommendations  None recommended by PT    Recommendations for Other Services       Precautions / Restrictions Precautions Precautions: Fall Precaution Comments: no hip precautions Restrictions Weight Bearing Restrictions: Yes RLE Weight Bearing: Weight bearing as tolerated    Mobility  Bed Mobility Overal bed mobility: Needs Assistance Bed Mobility: Supine to Sit;Sit to Supine     Supine to sit: Min assist Sit to supine: Min assist   General bed mobility comments: assist provided with Rt LE in/out of bed.   Transfers Overall transfer level: Needs assistance Equipment used: Rolling walker (2 wheeled) Transfers: Sit to/from Stand Sit to Stand: Min guard         General transfer comment: min guard for safety  Ambulation/Gait Ambulation/Gait assistance: Min guard Ambulation Distance (Feet): 160 Feet Assistive device: Rolling walker (2 wheeled) Gait Pattern/deviations: Step-to pattern;Step-through pattern Gait velocity: decreased   General Gait Details: initially using step-to pattern but able to progress to step-through with cues.    Stairs            Wheelchair Mobility    Modified Rankin (Stroke Patients Only)       Balance Overall balance assessment: Needs assistance Sitting-balance support: No upper extremity supported Sitting  balance-Leahy Scale: Good     Standing balance support: Bilateral upper extremity supported Standing balance-Leahy Scale: Poor Standing balance comment: using rw for support                    Cognition Arousal/Alertness: Awake/alert Behavior During Therapy: WFL for tasks assessed/performed Overall Cognitive Status: Within Functional Limits for tasks assessed                      Exercises Total Joint Exercises Ankle Circles/Pumps: AROM;Both;15 reps Quad Sets: Strengthening;Right;10 reps Short Arc Quad: Strengthening;Right;10 reps Heel Slides: AAROM;Right;10 reps Hip ABduction/ADduction: Strengthening;Right;10 reps (min assist)    General Comments        Pertinent Vitals/Pain Pain Assessment: 0-10 Pain Score: 8  Pain Location: Rt groin and thigh Pain Descriptors / Indicators: Aching;Burning Pain Intervention(s): Limited activity within patient's tolerance;Monitored during session    Home Living                      Prior Function            PT Goals (current goals can now be found in the care plan section) Acute Rehab PT Goals Patient Stated Goal: get home PT Goal Formulation: With patient Time For Goal Achievement: 08/31/15 Potential to Achieve Goals: Good Progress towards PT goals: Progressing toward goals    Frequency  7X/week    PT Plan Current plan remains appropriate    Co-evaluation             End of Session Equipment Utilized During Treatment: Gait belt Activity Tolerance: Patient tolerated treatment well Patient  left: in bed;with call bell/phone within reach;with family/visitor present;with SCD's reapplied     Time: [id number] PT Time Calculation (min) (ACUTE ONLY): 29 min  Charges:  $Gait Training: 8-22 mins $Therapeutic Exercise: 8-22 mins                    G Codes:      Cassell Clement, PT, CSCS Pager [phone] Office [phone]  08/17/2015, 3:54 PM

## 2015-08-17 NOTE — Care Management Important Message (Signed)
Important Message  Patient Details  Name: Jacqueline Lucas MRN: [id number] Date of Birth: 03-17-1950   Medicare Important Message Given:  Yes    Loann Quill 08/17/2015, 8:22 AM

## 2015-08-18 ENCOUNTER — Encounter (HOSPITAL_COMMUNITY): Payer: Self-pay | Admitting: General Practice

## 2015-08-18 ENCOUNTER — Other Ambulatory Visit: Payer: Self-pay | Admitting: Family Medicine

## 2015-08-18 LAB — CBC
HCT: 25.5 % — ABNORMAL LOW (ref 36.0–46.0)
Hemoglobin: 7.9 g/dL — ABNORMAL LOW (ref 12.0–15.0)
MCH: 25.5 pg — ABNORMAL LOW (ref 26.0–34.0)
MCHC: 31 g/dL (ref 30.0–36.0)
MCV: 82.3 fL (ref 78.0–100.0)
Platelets: 212 10*3/uL (ref 150–400)
RBC: 3.1 MIL/uL — ABNORMAL LOW (ref 3.87–5.11)
RDW: 17.7 % — ABNORMAL HIGH (ref 11.5–15.5)
WBC: 9.8 10*3/uL (ref 4.0–10.5)

## 2015-08-18 MED ORDER — OXYCODONE HCL 5 MG PO TABS: 5.0000 mg | ORAL_TABLET | ORAL | Status: DC | PRN

## 2015-08-18 NOTE — Progress Notes (Signed)
Physical Therapy Treatment Patient Details Name: Jacqueline Lucas MRN: [id number] DOB: 10-04-1950 Today's Date: 08/18/2015    History of Present Illness 65 y.o. female now s/p Rt direct anterior THA. PMH: depression, alzheimer, tachycardia, fibromyalgia, angina, bilateral TKA, Lt THA, spine surgery X2.     PT Comments    Patient is making good progress with PT.  From a mobility standpoint anticipate patient will be ready for DC home with family support. Patient denies any questions or concerns.      Follow Up Recommendations  Home health PT;Supervision for mobility/OOB     Equipment Recommendations  None recommended by PT    Recommendations for Other Services       Precautions / Restrictions Precautions Precautions: Fall Precaution Comments: no hip precautions Restrictions Weight Bearing Restrictions: Yes RLE Weight Bearing: Weight bearing as tolerated    Mobility  Bed Mobility Overal bed mobility: Needs Assistance Bed Mobility: Supine to Sit;Sit to Supine     Supine to sit: Supervision;HOB elevated Sit to supine: Supervision;HOB elevated   General bed mobility comments: supervision for safety, HOB elevated as pt reports having an adjustable bed at home.   Transfers Overall transfer level: Needs assistance Equipment used: Rolling walker (2 wheeled) Transfers: Sit to/from Stand Sit to Stand: Supervision         General transfer comment: supervision for safety  Ambulation/Gait Ambulation/Gait assistance: Supervision Ambulation Distance (Feet): 170 Feet Assistive device: Rolling walker (2 wheeled) Gait Pattern/deviations: Step-through pattern;Decreased weight shift to right Gait velocity: decreased   General Gait Details: pt progressing to step-through pattern with cues. No loss of balance or instability.    Stairs            Wheelchair Mobility    Modified Rankin (Stroke Patients Only)       Balance Overall balance assessment: Needs  assistance Sitting-balance support: No upper extremity supported Sitting balance-Leahy Scale: Good     Standing balance support: During functional activity Standing balance-Leahy Scale: Fair Standing balance comment: able to stand without UE support.                     Cognition Arousal/Alertness: Awake/alert Behavior During Therapy: WFL for tasks assessed/performed Overall Cognitive Status: Within Functional Limits for tasks assessed                      Exercises Total Joint Exercises Ankle Circles/Pumps: AROM;Both;15 reps Quad Sets: Strengthening;Right;10 reps Short Arc Quad: Strengthening;Right;10 reps Heel Slides: AAROM;Right;10 reps Hip ABduction/ADduction: Strengthening;Right;10 reps    General Comments        Pertinent Vitals/Pain Pain Assessment: 0-10 Pain Score: 6  Faces Pain Scale: No hurt Pain Location: Rt hip Pain Descriptors / Indicators: Burning Pain Intervention(s): Limited activity within patient's tolerance;Monitored during session    Home Living    Prior Function          PT Goals (current goals can now be found in the care plan section) Acute Rehab PT Goals Patient Stated Goal: get home PT Goal Formulation: With patient Time For Goal Achievement: 08/31/15 Potential to Achieve Goals: Good Progress towards PT goals: Progressing toward goals    Frequency  7X/week    PT Plan Current plan remains appropriate    Co-evaluation             End of Session Equipment Utilized During Treatment: Gait belt Activity Tolerance: Patient tolerated treatment well Patient left: in bed;with call bell/phone within reach;with family/visitor present;with SCD's reapplied  Time: [id number] PT Time Calculation (min) (ACUTE ONLY): 23 min  Charges:  $Gait Training: 8-22 mins $Therapeutic Exercise: 8-22 mins                    G Codes:      Cassell Clement, PT, CSCS Pager [phone] Office [phone]  08/18/2015, 11:28  AM

## 2015-08-18 NOTE — Progress Notes (Signed)
Reviewed discharge/medications with patient and son.  Answered all of their questions.

## 2015-08-18 NOTE — Progress Notes (Signed)
Subjective: Pt stable - no dizziness when up and around   Objective: Vital signs in last 24 hours: Temp:  [98.7 F (37.1 C)-100.5 F (38.1 C)] 98.7 F (37.1 C) (06/21 0500) Pulse Rate:  [101-109] 102 (06/21 0500) Resp:  [16-18] 16 (06/21 0500) BP: (104-144)/(35-57) 113/57 mmHg (06/21 0500) SpO2:  [91 %-96 %] 94 % (06/21 0500)  Intake/Output from previous day: 06/20 0701 - 06/21 0700 In: 1320 [P.O.:1080; I.V.:240] Out: -  Intake/Output this shift:    Exam:  Sensation intact distally Intact pulses distally  Labs:  Recent Labs  08/17/15 0306 08/18/15 0450  HGB 9.3* 7.9*    Recent Labs  08/17/15 0306 08/18/15 0450  WBC 13.2* 9.8  RBC 3.60* 3.10*  HCT 29.9* 25.5*  PLT 224 212    Recent Labs  08/17/15 0306  NA 138  K 3.7  CL 104  CO2 27  BUN 8  CREATININE 0.74  GLUCOSE 177*  CALCIUM 8.7*   No results for input(s): LABPT, INR in the last 72 hours.  Assessment/Plan: Plan dc today   DEAN,GREGORY SCOTT 08/18/2015, 7:49 AM

## 2015-08-18 NOTE — Evaluation (Signed)
Occupational Therapy Evaluation and Discharge Patient Details Name: Jacqueline Lucas MRN: [id number] DOB: 25-Dec-1950 Today's Date: 08/18/2015    History of Present Illness 65 y.o. female now s/p Rt direct anterior THA. PMH: depression, alzheimer, tachycardia, fibromyalgia, angina, bilateral TKA, Lt THA, spine surgery X2.    Clinical Impression   Pt was modified independent prior to admission. Pt with impulsivity requiring supervision for OOB mobility. Pt needs min assist for LB bathing and dressing, will rely on daughter's assist until she is able to perform on her own. No further OT needs. Pt has all necessary equipment at home.    Follow Up Recommendations  No OT follow up;Supervision/Assistance - 24 hour    Equipment Recommendations  None recommended by OT    Recommendations for Other Services       Precautions / Restrictions Precautions Precautions: Fall Precaution Comments: no hip precautions Restrictions RLE Weight Bearing: Weight bearing as tolerated      Mobility Bed Mobility Overal bed mobility: Modified Independent Bed Mobility: Supine to Sit     Supine to sit: HOB elevated     General bed mobility comments: no assist, HOB up  Transfers Overall transfer level: Needs assistance Equipment used: Rolling walker (2 wheeled)   Sit to Stand: Supervision         General transfer comment: supervision for safety, cues to stand momentarily before walking    Balance     Sitting balance-Leahy Scale: Good       Standing balance-Leahy Scale: Fair                              ADL Overall ADL's : Needs assistance/impaired Eating/Feeding: Independent;Sitting   Grooming: Wash/dry hands;Wash/dry face;Oral care;Standing;Supervision/safety   Upper Body Bathing: Set up;Sitting   Lower Body Bathing: Minimal assistance;Sit to/from stand   Upper Body Dressing : Set up;Sitting   Lower Body Dressing: Minimal assistance;Sit to/from stand Lower  Body Dressing Details (indicate cue type and reason): donned slip on shoes independently Toilet Transfer: Supervision/safety;RW;Ambulation;Comfort height toilet   Toileting- Clothing Manipulation and Hygiene: Supervision/safety;Sit to/from stand       Functional mobility during ADLs: Supervision/safety;Rolling walker General ADL Comments: pt needing supervision for safety     Vision     Perception     Praxis      Pertinent Vitals/Pain Pain Assessment: Faces Faces Pain Scale: No hurt     Hand Dominance Right   Extremity/Trunk Assessment Upper Extremity Assessment Upper Extremity Assessment: Overall WFL for tasks assessed   Lower Extremity Assessment Lower Extremity Assessment: Defer to PT evaluation       Communication Communication Communication: No difficulties   Cognition Arousal/Alertness: Awake/alert Behavior During Therapy: Impulsive Overall Cognitive Status: Within Functional Limits for tasks assessed, pt with hx of dementia                     General Comments       Exercises       Shoulder Instructions      Home Living Family/patient expects to be discharged to:: Private residence Living Arrangements: Children Available Help at Discharge: Family;Available 24 hours/day Type of Home: House Home Access: Level entry     Home Layout: One level     Bathroom Shower/Tub: Occupational psychologist: Handicapped height     Home Equipment: Environmental consultant - 2 wheels;Wheelchair - manual;Shower seat - built in;Toilet riser;Shower seat;Hand held shower head   Additional  Comments: will have son and daughter staying with her at home. Spouse is in the hospital.       Prior Functioning/Environment Level of Independence: Independent             OT Diagnosis: Generalized weakness   OT Problem List:     OT Treatment/Interventions:      OT Goals(Current goals can be found in the care plan section) Acute Rehab OT Goals Patient Stated Goal:  get home  OT Frequency:     Barriers to D/C:            Co-evaluation              End of Session Equipment Utilized During Treatment: Rolling walker;Gait belt  Activity Tolerance: Patient tolerated treatment well Patient left: in chair;with call bell/phone within reach   Time: 0831-0856 OT Time Calculation (min): 25 min Charges:  OT General Charges $OT Visit: 1 Procedure OT Evaluation $OT Eval Low Complexity: 1 Procedure OT Treatments $Self Care/Home Management : 8-22 mins G-Codes:    Malka So 08/18/2015, 9:03 AM  [phone]

## 2015-08-19 DIAGNOSIS — M4800 Spinal stenosis, site unspecified: Secondary | ICD-10-CM | POA: Diagnosis not present

## 2015-08-19 DIAGNOSIS — Z96643 Presence of artificial hip joint, bilateral: Secondary | ICD-10-CM | POA: Diagnosis not present

## 2015-08-19 DIAGNOSIS — Z96653 Presence of artificial knee joint, bilateral: Secondary | ICD-10-CM | POA: Diagnosis not present

## 2015-08-19 DIAGNOSIS — Z471 Aftercare following joint replacement surgery: Secondary | ICD-10-CM | POA: Diagnosis not present

## 2015-08-19 DIAGNOSIS — I10 Essential (primary) hypertension: Secondary | ICD-10-CM | POA: Diagnosis not present

## 2015-08-19 DIAGNOSIS — M797 Fibromyalgia: Secondary | ICD-10-CM | POA: Diagnosis not present

## 2015-08-20 NOTE — Discharge Summary (Signed)
Physician Discharge Summary  Patient ID: Jacqueline Lucas MRN: [id number] DOB/AGE: [birth year] 66 y.o.  Admit date: 08/16/2015 Discharge date: 08/18/2015  Admission Diagnoses:  Active Problems:   Hip arthritis   Discharge Diagnoses:  Same  Surgeries: Procedure(s): TOTAL HIP ARTHROPLASTY ANTERIOR APPROACH on 08/16/2015   Consultants:    Discharged Condition: Stable  Hospital Course: Jacqueline Lucas is an 65 y.o. female who was admitted 08/16/2015 with a chief complaint of right hip pain, and found to have a diagnosis of right hip arthritis.  They were brought to the operating room on 08/16/2015 and underwent the above named procedures.  She did well with pt and was transferred to home mobile and in good condition pod  2  Antibiotics given:  Anti-infectives    Start     Dose/Rate Route Frequency Ordered Stop   08/16/15 1930  ceFAZolin (ANCEF) IVPB 2g/100 mL premix     2 g 200 mL/hr over 30 Minutes Intravenous Every 6 hours 08/16/15 1856 08/17/15 0213   08/16/15 1130  ceFAZolin (ANCEF) IVPB 2g/100 mL premix     2 g 200 mL/hr over 30 Minutes Intravenous To ShortStay Surgical 08/15/15 1133 08/16/15 1352    .  Recent vital signs:  Filed Vitals:   08/18/15 0100 08/18/15 0500  BP:  113/57  Pulse:  102  Temp: 98.9 F (37.2 C) 98.7 F (37.1 C)  Resp:  16    Recent laboratory studies:  Results for orders placed or performed during the hospital encounter of 08/16/15  CBC  Result Value Ref Range   WBC 13.2 (H) 4.0 - 10.5 K/uL   RBC 3.60 (L) 3.87 - 5.11 MIL/uL   Hemoglobin 9.3 (L) 12.0 - 15.0 g/dL   HCT 29.9 (L) 36.0 - 46.0 %   MCV 83.1 78.0 - 100.0 fL   MCH 25.8 (L) 26.0 - 34.0 pg   MCHC 31.1 30.0 - 36.0 g/dL   RDW 17.0 (H) 11.5 - 15.5 %   Platelets 224 150 - 400 K/uL  Basic metabolic panel  Result Value Ref Range   Sodium 138 135 - 145 mmol/L   Potassium 3.7 3.5 - 5.1 mmol/L   Chloride 104 101 - 111 mmol/L   CO2 27 22 - 32 mmol/L   Glucose, Bld 177 (H) 65 - 99  mg/dL   BUN 8 6 - 20 mg/dL   Creatinine, Ser 0.74 0.44 - 1.00 mg/dL   Calcium 8.7 (L) 8.9 - 10.3 mg/dL   GFR calc non Af Amer >60 >60 mL/min   GFR calc Af Amer >60 >60 mL/min   Anion gap 7 5 - 15  CBC  Result Value Ref Range   WBC 9.8 4.0 - 10.5 K/uL   RBC 3.10 (L) 3.87 - 5.11 MIL/uL   Hemoglobin 7.9 (L) 12.0 - 15.0 g/dL   HCT 25.5 (L) 36.0 - 46.0 %   MCV 82.3 78.0 - 100.0 fL   MCH 25.5 (L) 26.0 - 34.0 pg   MCHC 31.0 30.0 - 36.0 g/dL   RDW 17.7 (H) 11.5 - 15.5 %   Platelets 212 150 - 400 K/uL    Discharge Medications:     Medication List    STOP taking these medications        acetaminophen 500 MG tablet  Commonly known as:  TYLENOL     amoxicillin 500 MG capsule  Commonly known as:  AMOXIL     ciprofloxacin 500 MG tablet  Commonly known as:  CIPRO  diclofenac sodium 1 % Gel  Commonly known as:  VOLTAREN     estradiol 0.1 MG/GM vaginal cream  Commonly known as:  ESTRACE      TAKE these medications        albuterol 108 (90 Base) MCG/ACT inhaler  Commonly known as:  PROAIR HFA  Inhale 2 puffs into the lungs every 6 (six) hours as needed for wheezing or shortness of breath.     aspirin EC 325 MG tablet  Take 325 mg by mouth at bedtime.     atorvastatin 40 MG tablet  Commonly known as:  LIPITOR  Take 1 tablet (40 mg total) by mouth at bedtime.     baclofen 10 MG tablet  Commonly known as:  LIORESAL  Take 10 mg by mouth 2 (two) times daily as needed for muscle spasms. Reported on 07/28/2015     clonazePAM 0.5 MG tablet  Commonly known as:  KLONOPIN  Take 1 tablet (0.5 mg total) by mouth daily as needed for anxiety.     cycloSPORINE 0.05 % ophthalmic emulsion  Commonly known as:  RESTASIS  Place 1 drop into both eyes 2 (two) times daily.     D3-1000 1000 units tablet  Generic drug:  Cholecalciferol  Take 1,000 Units by mouth at bedtime.     gabapentin 300 MG capsule  Commonly known as:  NEURONTIN  Take 900 mg by mouth 3 (three) times daily.      lansoprazole 30 MG capsule  Commonly known as:  PREVACID  Take 1 capsule (30 mg total) by mouth 2 (two) times daily before a meal.     levalbuterol 1.25 MG/3ML nebulizer solution  Commonly known as:  XOPENEX  Take 1.25 mg by nebulization every 4 (four) hours as needed for wheezing.     magnesium oxide 400 MG tablet  Commonly known as:  MAG-OX  Take 400 mg by mouth at bedtime. Reported on 07/28/2015     mometasone-formoterol 200-5 MCG/ACT Aero  Commonly known as:  DULERA  Inhale 2 puffs into the lungs 2 (two) times daily. Reported on 07/28/2015     montelukast 10 MG tablet  Commonly known as:  SINGULAIR  Take 1 tablet (10 mg total) by mouth at bedtime.     ondansetron 4 MG tablet  Commonly known as:  ZOFRAN  Take 1 tablet (4 mg total) by mouth every 8 (eight) hours as needed for nausea or vomiting.     oxyCODONE 5 MG immediate release tablet  Commonly known as:  Oxy IR/ROXICODONE  Take 1-2 tablets (5-10 mg total) by mouth every 3 (three) hours as needed for breakthrough pain.     Polyethyl Glycol-Propyl Glycol 0.4-0.3 % Soln  Place 3 drops into both eyes 2 (two) times daily as needed (dry eyes). Reported on 07/28/2015     SYNTHROID 88 MCG tablet  Generic drug:  levothyroxine  Take 88 mcg by mouth daily before breakfast.     valsartan-hydrochlorothiazide 320-12.5 MG tablet  Commonly known as:  DIOVAN HCT  Take 1 tablet by mouth daily.     venlafaxine XR 150 MG 24 hr capsule  Commonly known as:  EFFEXOR-XR  Take 1 capsule (150 mg total) by mouth daily with breakfast.     vitamin B-12 1000 MCG tablet  Commonly known as:  CYANOCOBALAMIN  Take 1,000 mcg by mouth at bedtime.        Diagnostic Studies: Dg Hip Port Unilat With Pelvis 1v Right  08/16/2015  CLINICAL DATA:  Postoperative radiographs, osteoarthritis. EXAM: DG HIP (WITH OR WITHOUT PELVIS) 1V PORT RIGHT COMPARISON:  08/16/2015 and 07/19/2015 FINDINGS: Right total hip prosthesis in place with excellent alignment, and  no fracture or early complicating feature identified. Expected gas in the soft tissues. Old left hip prosthesis and lower lumbar hardware noted. Bony demineralization. IMPRESSION: 1. Right total hip prosthesis in place with good alignment and no complicating feature. Electronically Signed   By: Van Clines M.D.   On: 08/16/2015 18:51   Dg Hip Operative Unilat W Or W/o Pelvis Right  08/16/2015  CLINICAL DATA:  Anterior RIGHT total hip arthroplasty EXAM: OPERATIVE RIGHT HIP (WITH PELVIS IF PERFORMED) 2 VIEWS TECHNIQUE: Fluoroscopic spot image(s) were submitted for interpretation post-operatively. COMPARISON:  07/19/2015 FLUOROSCOPY TIME:  1 minutes 7 seconds FINDINGS: Components of RIGHT hip prosthesis identified. No fracture or dislocation seen on AP images. No periprosthetic lucency. Bones appear demineralized. IMPRESSION: RIGHT hip prosthesis without acute abnormalities. Electronically Signed   By: Lavonia Dana M.D.   On: 08/16/2015 17:18    Disposition: 01-Home or Self Care      Discharge Instructions    Call MD / Call 911    Complete by:  As directed   If you experience chest pain or shortness of breath, CALL 911 and be transported to the hospital emergency room.  If you develope a fever above 101 F, pus (white drainage) or increased drainage or redness at the wound, or calf pain, call your surgeon's office.     Constipation Prevention    Complete by:  As directed   Drink plenty of fluids.  Prune juice may be helpful.  You may use a stool softener, such as Colace (over the counter) 100 mg twice a day.  Use MiraLax (over the counter) for constipation as needed.     Diet - low sodium heart healthy    Complete by:  As directed      Discharge instructions    Complete by:  As directed   Weight bearing as tolerated with walker Ok to shower with dressing in place Follow up two weeks     Increase activity slowly as tolerated    Complete by:  As directed                Signed: DEAN,GREGORY SCOTT 08/20/2015, 8:12 AM

## 2015-08-23 DIAGNOSIS — I10 Essential (primary) hypertension: Secondary | ICD-10-CM | POA: Diagnosis not present

## 2015-08-23 DIAGNOSIS — Z96643 Presence of artificial hip joint, bilateral: Secondary | ICD-10-CM | POA: Diagnosis not present

## 2015-08-23 DIAGNOSIS — Z96653 Presence of artificial knee joint, bilateral: Secondary | ICD-10-CM | POA: Diagnosis not present

## 2015-08-23 DIAGNOSIS — M4800 Spinal stenosis, site unspecified: Secondary | ICD-10-CM | POA: Diagnosis not present

## 2015-08-23 DIAGNOSIS — Z471 Aftercare following joint replacement surgery: Secondary | ICD-10-CM | POA: Diagnosis not present

## 2015-08-23 DIAGNOSIS — M797 Fibromyalgia: Secondary | ICD-10-CM | POA: Diagnosis not present

## 2015-08-25 DIAGNOSIS — Z471 Aftercare following joint replacement surgery: Secondary | ICD-10-CM | POA: Diagnosis not present

## 2015-08-25 DIAGNOSIS — Z96653 Presence of artificial knee joint, bilateral: Secondary | ICD-10-CM | POA: Diagnosis not present

## 2015-08-25 DIAGNOSIS — Z96643 Presence of artificial hip joint, bilateral: Secondary | ICD-10-CM | POA: Diagnosis not present

## 2015-08-25 DIAGNOSIS — M4800 Spinal stenosis, site unspecified: Secondary | ICD-10-CM | POA: Diagnosis not present

## 2015-08-25 DIAGNOSIS — I10 Essential (primary) hypertension: Secondary | ICD-10-CM | POA: Diagnosis not present

## 2015-08-25 DIAGNOSIS — M797 Fibromyalgia: Secondary | ICD-10-CM | POA: Diagnosis not present

## 2015-08-26 DIAGNOSIS — Z96653 Presence of artificial knee joint, bilateral: Secondary | ICD-10-CM | POA: Diagnosis not present

## 2015-08-26 DIAGNOSIS — M797 Fibromyalgia: Secondary | ICD-10-CM | POA: Diagnosis not present

## 2015-08-26 DIAGNOSIS — Z471 Aftercare following joint replacement surgery: Secondary | ICD-10-CM | POA: Diagnosis not present

## 2015-08-26 DIAGNOSIS — I10 Essential (primary) hypertension: Secondary | ICD-10-CM | POA: Diagnosis not present

## 2015-08-26 DIAGNOSIS — Z96643 Presence of artificial hip joint, bilateral: Secondary | ICD-10-CM | POA: Diagnosis not present

## 2015-08-26 DIAGNOSIS — M4800 Spinal stenosis, site unspecified: Secondary | ICD-10-CM | POA: Diagnosis not present

## 2015-08-30 DIAGNOSIS — Z96641 Presence of right artificial hip joint: Secondary | ICD-10-CM | POA: Diagnosis not present

## 2015-09-01 DIAGNOSIS — M797 Fibromyalgia: Secondary | ICD-10-CM | POA: Diagnosis not present

## 2015-09-01 DIAGNOSIS — I10 Essential (primary) hypertension: Secondary | ICD-10-CM | POA: Diagnosis not present

## 2015-09-01 DIAGNOSIS — M4800 Spinal stenosis, site unspecified: Secondary | ICD-10-CM | POA: Diagnosis not present

## 2015-09-01 DIAGNOSIS — Z96643 Presence of artificial hip joint, bilateral: Secondary | ICD-10-CM | POA: Diagnosis not present

## 2015-09-01 DIAGNOSIS — Z471 Aftercare following joint replacement surgery: Secondary | ICD-10-CM | POA: Diagnosis not present

## 2015-09-01 DIAGNOSIS — Z96653 Presence of artificial knee joint, bilateral: Secondary | ICD-10-CM | POA: Diagnosis not present

## 2015-09-03 DIAGNOSIS — T792XXA Traumatic secondary and recurrent hemorrhage and seroma, initial encounter: Secondary | ICD-10-CM | POA: Diagnosis not present

## 2015-09-06 ENCOUNTER — Other Ambulatory Visit: Payer: Self-pay

## 2015-09-06 DIAGNOSIS — E785 Hyperlipidemia, unspecified: Secondary | ICD-10-CM

## 2015-09-06 MED ORDER — ATORVASTATIN CALCIUM 40 MG PO TABS: 40.0000 mg | ORAL_TABLET | Freq: Every day | ORAL | Status: DC

## 2015-09-06 NOTE — Telephone Encounter (Signed)
Patient requesting refill. 

## 2015-09-07 DIAGNOSIS — Z471 Aftercare following joint replacement surgery: Secondary | ICD-10-CM | POA: Diagnosis not present

## 2015-09-07 DIAGNOSIS — I10 Essential (primary) hypertension: Secondary | ICD-10-CM | POA: Diagnosis not present

## 2015-09-07 DIAGNOSIS — Z96653 Presence of artificial knee joint, bilateral: Secondary | ICD-10-CM | POA: Diagnosis not present

## 2015-09-07 DIAGNOSIS — Z96643 Presence of artificial hip joint, bilateral: Secondary | ICD-10-CM | POA: Diagnosis not present

## 2015-09-07 DIAGNOSIS — M797 Fibromyalgia: Secondary | ICD-10-CM | POA: Diagnosis not present

## 2015-09-07 DIAGNOSIS — M4800 Spinal stenosis, site unspecified: Secondary | ICD-10-CM | POA: Diagnosis not present

## 2015-09-08 DIAGNOSIS — M797 Fibromyalgia: Secondary | ICD-10-CM | POA: Diagnosis not present

## 2015-09-08 DIAGNOSIS — I10 Essential (primary) hypertension: Secondary | ICD-10-CM | POA: Diagnosis not present

## 2015-09-08 DIAGNOSIS — M4800 Spinal stenosis, site unspecified: Secondary | ICD-10-CM | POA: Diagnosis not present

## 2015-09-08 DIAGNOSIS — Z96653 Presence of artificial knee joint, bilateral: Secondary | ICD-10-CM | POA: Diagnosis not present

## 2015-09-08 DIAGNOSIS — Z471 Aftercare following joint replacement surgery: Secondary | ICD-10-CM | POA: Diagnosis not present

## 2015-09-08 DIAGNOSIS — Z96643 Presence of artificial hip joint, bilateral: Secondary | ICD-10-CM | POA: Diagnosis not present

## 2015-09-13 DIAGNOSIS — Z96653 Presence of artificial knee joint, bilateral: Secondary | ICD-10-CM | POA: Diagnosis not present

## 2015-09-13 DIAGNOSIS — M797 Fibromyalgia: Secondary | ICD-10-CM | POA: Diagnosis not present

## 2015-09-13 DIAGNOSIS — Z96643 Presence of artificial hip joint, bilateral: Secondary | ICD-10-CM | POA: Diagnosis not present

## 2015-09-13 DIAGNOSIS — M4800 Spinal stenosis, site unspecified: Secondary | ICD-10-CM | POA: Diagnosis not present

## 2015-09-13 DIAGNOSIS — I10 Essential (primary) hypertension: Secondary | ICD-10-CM | POA: Diagnosis not present

## 2015-09-13 DIAGNOSIS — Z471 Aftercare following joint replacement surgery: Secondary | ICD-10-CM | POA: Diagnosis not present

## 2015-09-14 ENCOUNTER — Other Ambulatory Visit: Payer: Self-pay

## 2015-09-14 DIAGNOSIS — I10 Essential (primary) hypertension: Secondary | ICD-10-CM

## 2015-09-14 MED ORDER — VALSARTAN-HYDROCHLOROTHIAZIDE 320-12.5 MG PO TABS: 1.0000 | ORAL_TABLET | Freq: Every day | ORAL | Status: DC

## 2015-09-14 NOTE — Telephone Encounter (Signed)
Got a fax from [facility] requesting a 90 day supply of this patient's valsartan-HCTZ 160-12.5.  Refill request was sent to Dr. [name] for approval and submission.

## 2015-09-15 DIAGNOSIS — M4800 Spinal stenosis, site unspecified: Secondary | ICD-10-CM | POA: Diagnosis not present

## 2015-09-15 DIAGNOSIS — Z96653 Presence of artificial knee joint, bilateral: Secondary | ICD-10-CM | POA: Diagnosis not present

## 2015-09-15 DIAGNOSIS — I10 Essential (primary) hypertension: Secondary | ICD-10-CM | POA: Diagnosis not present

## 2015-09-15 DIAGNOSIS — Z471 Aftercare following joint replacement surgery: Secondary | ICD-10-CM | POA: Diagnosis not present

## 2015-09-15 DIAGNOSIS — M797 Fibromyalgia: Secondary | ICD-10-CM | POA: Diagnosis not present

## 2015-09-15 DIAGNOSIS — Z96643 Presence of artificial hip joint, bilateral: Secondary | ICD-10-CM | POA: Diagnosis not present

## 2015-09-20 DIAGNOSIS — M4800 Spinal stenosis, site unspecified: Secondary | ICD-10-CM | POA: Diagnosis not present

## 2015-09-20 DIAGNOSIS — Z96643 Presence of artificial hip joint, bilateral: Secondary | ICD-10-CM | POA: Diagnosis not present

## 2015-09-20 DIAGNOSIS — I10 Essential (primary) hypertension: Secondary | ICD-10-CM | POA: Diagnosis not present

## 2015-09-20 DIAGNOSIS — M797 Fibromyalgia: Secondary | ICD-10-CM | POA: Diagnosis not present

## 2015-09-20 DIAGNOSIS — Z96653 Presence of artificial knee joint, bilateral: Secondary | ICD-10-CM | POA: Diagnosis not present

## 2015-09-20 DIAGNOSIS — Z471 Aftercare following joint replacement surgery: Secondary | ICD-10-CM | POA: Diagnosis not present

## 2015-09-23 DIAGNOSIS — Z96653 Presence of artificial knee joint, bilateral: Secondary | ICD-10-CM | POA: Diagnosis not present

## 2015-09-23 DIAGNOSIS — I10 Essential (primary) hypertension: Secondary | ICD-10-CM | POA: Diagnosis not present

## 2015-09-23 DIAGNOSIS — Z471 Aftercare following joint replacement surgery: Secondary | ICD-10-CM | POA: Diagnosis not present

## 2015-09-23 DIAGNOSIS — M4800 Spinal stenosis, site unspecified: Secondary | ICD-10-CM | POA: Diagnosis not present

## 2015-09-23 DIAGNOSIS — Z96643 Presence of artificial hip joint, bilateral: Secondary | ICD-10-CM | POA: Diagnosis not present

## 2015-09-23 DIAGNOSIS — M797 Fibromyalgia: Secondary | ICD-10-CM | POA: Diagnosis not present

## 2015-10-04 DIAGNOSIS — Z96643 Presence of artificial hip joint, bilateral: Secondary | ICD-10-CM | POA: Diagnosis not present

## 2015-10-04 DIAGNOSIS — Z96653 Presence of artificial knee joint, bilateral: Secondary | ICD-10-CM | POA: Diagnosis not present

## 2015-10-04 DIAGNOSIS — M4800 Spinal stenosis, site unspecified: Secondary | ICD-10-CM | POA: Diagnosis not present

## 2015-10-04 DIAGNOSIS — Z471 Aftercare following joint replacement surgery: Secondary | ICD-10-CM | POA: Diagnosis not present

## 2015-10-04 DIAGNOSIS — I10 Essential (primary) hypertension: Secondary | ICD-10-CM | POA: Diagnosis not present

## 2015-10-04 DIAGNOSIS — M797 Fibromyalgia: Secondary | ICD-10-CM | POA: Diagnosis not present

## 2015-10-06 DIAGNOSIS — E039 Hypothyroidism, unspecified: Secondary | ICD-10-CM | POA: Diagnosis not present

## 2015-10-06 DIAGNOSIS — E049 Nontoxic goiter, unspecified: Secondary | ICD-10-CM | POA: Diagnosis not present

## 2015-10-06 DIAGNOSIS — F411 Generalized anxiety disorder: Secondary | ICD-10-CM | POA: Diagnosis not present

## 2015-10-06 DIAGNOSIS — M150 Primary generalized (osteo)arthritis: Secondary | ICD-10-CM | POA: Diagnosis not present

## 2015-10-06 DIAGNOSIS — E784 Other hyperlipidemia: Secondary | ICD-10-CM | POA: Diagnosis not present

## 2015-10-06 DIAGNOSIS — I10 Essential (primary) hypertension: Secondary | ICD-10-CM | POA: Diagnosis not present

## 2015-10-06 DIAGNOSIS — M810 Age-related osteoporosis without current pathological fracture: Secondary | ICD-10-CM | POA: Diagnosis not present

## 2015-10-08 ENCOUNTER — Ambulatory Visit: Payer: Medicare Other | Admitting: Family Medicine

## 2015-10-12 ENCOUNTER — Other Ambulatory Visit: Payer: Self-pay | Admitting: Family Medicine

## 2015-10-12 DIAGNOSIS — J4540 Moderate persistent asthma, uncomplicated: Secondary | ICD-10-CM

## 2015-10-12 DIAGNOSIS — I10 Essential (primary) hypertension: Secondary | ICD-10-CM | POA: Diagnosis not present

## 2015-10-12 DIAGNOSIS — Z96653 Presence of artificial knee joint, bilateral: Secondary | ICD-10-CM | POA: Diagnosis not present

## 2015-10-12 DIAGNOSIS — M4800 Spinal stenosis, site unspecified: Secondary | ICD-10-CM | POA: Diagnosis not present

## 2015-10-12 DIAGNOSIS — Z96643 Presence of artificial hip joint, bilateral: Secondary | ICD-10-CM | POA: Diagnosis not present

## 2015-10-12 DIAGNOSIS — Z471 Aftercare following joint replacement surgery: Secondary | ICD-10-CM | POA: Diagnosis not present

## 2015-10-12 DIAGNOSIS — M797 Fibromyalgia: Secondary | ICD-10-CM | POA: Diagnosis not present

## 2015-10-13 DIAGNOSIS — M461 Sacroiliitis, not elsewhere classified: Secondary | ICD-10-CM | POA: Diagnosis not present

## 2015-10-13 DIAGNOSIS — M47816 Spondylosis without myelopathy or radiculopathy, lumbar region: Secondary | ICD-10-CM | POA: Diagnosis not present

## 2015-10-13 DIAGNOSIS — Z6833 Body mass index (BMI) 33.0-33.9, adult: Secondary | ICD-10-CM | POA: Diagnosis not present

## 2015-10-13 DIAGNOSIS — M533 Sacrococcygeal disorders, not elsewhere classified: Secondary | ICD-10-CM | POA: Diagnosis not present

## 2015-10-14 DIAGNOSIS — F411 Generalized anxiety disorder: Secondary | ICD-10-CM | POA: Diagnosis not present

## 2015-10-14 DIAGNOSIS — M810 Age-related osteoporosis without current pathological fracture: Secondary | ICD-10-CM | POA: Diagnosis not present

## 2015-10-14 DIAGNOSIS — I10 Essential (primary) hypertension: Secondary | ICD-10-CM | POA: Diagnosis not present

## 2015-10-14 DIAGNOSIS — Z471 Aftercare following joint replacement surgery: Secondary | ICD-10-CM | POA: Diagnosis not present

## 2015-10-14 DIAGNOSIS — E049 Nontoxic goiter, unspecified: Secondary | ICD-10-CM | POA: Diagnosis not present

## 2015-10-14 DIAGNOSIS — M797 Fibromyalgia: Secondary | ICD-10-CM | POA: Diagnosis not present

## 2015-10-14 DIAGNOSIS — E039 Hypothyroidism, unspecified: Secondary | ICD-10-CM | POA: Diagnosis not present

## 2015-10-14 DIAGNOSIS — M4800 Spinal stenosis, site unspecified: Secondary | ICD-10-CM | POA: Diagnosis not present

## 2015-10-14 DIAGNOSIS — E784 Other hyperlipidemia: Secondary | ICD-10-CM | POA: Diagnosis not present

## 2015-10-14 DIAGNOSIS — Z96643 Presence of artificial hip joint, bilateral: Secondary | ICD-10-CM | POA: Diagnosis not present

## 2015-10-14 DIAGNOSIS — M150 Primary generalized (osteo)arthritis: Secondary | ICD-10-CM | POA: Diagnosis not present

## 2015-10-14 DIAGNOSIS — Z96653 Presence of artificial knee joint, bilateral: Secondary | ICD-10-CM | POA: Diagnosis not present

## 2015-10-18 ENCOUNTER — Other Ambulatory Visit: Payer: Self-pay

## 2015-10-18 DIAGNOSIS — S638X2A Sprain of other part of left wrist and hand, initial encounter: Secondary | ICD-10-CM | POA: Diagnosis not present

## 2015-10-18 DIAGNOSIS — S638X1A Sprain of other part of right wrist and hand, initial encounter: Secondary | ICD-10-CM | POA: Diagnosis not present

## 2015-10-18 DIAGNOSIS — J4540 Moderate persistent asthma, uncomplicated: Secondary | ICD-10-CM

## 2015-10-18 MED ORDER — MONTELUKAST SODIUM 10 MG PO TABS: 10.0000 mg | ORAL_TABLET | Freq: Every day | ORAL | 0 refills | Status: DC

## 2015-10-18 NOTE — Telephone Encounter (Signed)
Patient requesting refill of Montelukast Sodium be sent to [facility] on [address].

## 2015-11-03 DIAGNOSIS — M461 Sacroiliitis, not elsewhere classified: Secondary | ICD-10-CM | POA: Diagnosis not present

## 2015-11-04 ENCOUNTER — Other Ambulatory Visit: Payer: Self-pay | Admitting: Family Medicine

## 2015-11-04 ENCOUNTER — Telehealth: Payer: Self-pay | Admitting: Family Medicine

## 2015-11-04 MED ORDER — MOMETASONE FURO-FORMOTEROL FUM 200-5 MCG/ACT IN AERO: 2.0000 | INHALATION_SPRAY | Freq: Two times a day (BID) | RESPIRATORY_TRACT | 0 refills | Status: DC

## 2015-11-04 NOTE — Telephone Encounter (Signed)
LFT MESSAGE RX AT [facility]

## 2015-11-04 NOTE — Telephone Encounter (Signed)
done

## 2015-11-09 ENCOUNTER — Other Ambulatory Visit: Payer: Self-pay | Admitting: Family Medicine

## 2015-11-09 DIAGNOSIS — F331 Major depressive disorder, recurrent, moderate: Secondary | ICD-10-CM

## 2015-11-09 DIAGNOSIS — F411 Generalized anxiety disorder: Secondary | ICD-10-CM

## 2015-11-09 NOTE — Telephone Encounter (Signed)
Patient requesting refill of Effexor be sent to [id number].

## 2015-11-10 ENCOUNTER — Encounter: Payer: Self-pay | Admitting: Family Medicine

## 2015-11-10 ENCOUNTER — Ambulatory Visit (INDEPENDENT_AMBULATORY_CARE_PROVIDER_SITE_OTHER): Payer: Medicare Other | Admitting: Family Medicine

## 2015-11-10 VITALS — BP 132/60 | HR 87 | Temp 98.0°F | Resp 16 | Ht 62.0 in | Wt 193.3 lb

## 2015-11-10 DIAGNOSIS — E559 Vitamin D deficiency, unspecified: Secondary | ICD-10-CM

## 2015-11-10 DIAGNOSIS — F331 Major depressive disorder, recurrent, moderate: Secondary | ICD-10-CM

## 2015-11-10 DIAGNOSIS — M797 Fibromyalgia: Secondary | ICD-10-CM

## 2015-11-10 DIAGNOSIS — Z1239 Encounter for other screening for malignant neoplasm of breast: Secondary | ICD-10-CM

## 2015-11-10 DIAGNOSIS — E785 Hyperlipidemia, unspecified: Secondary | ICD-10-CM | POA: Diagnosis not present

## 2015-11-10 DIAGNOSIS — I471 Supraventricular tachycardia, unspecified: Secondary | ICD-10-CM

## 2015-11-10 DIAGNOSIS — Z23 Encounter for immunization: Secondary | ICD-10-CM | POA: Diagnosis not present

## 2015-11-10 DIAGNOSIS — Z862 Personal history of diseases of the blood and blood-forming organs and certain disorders involving the immune mechanism: Secondary | ICD-10-CM

## 2015-11-10 DIAGNOSIS — I209 Angina pectoris, unspecified: Secondary | ICD-10-CM | POA: Diagnosis not present

## 2015-11-10 DIAGNOSIS — I10 Essential (primary) hypertension: Secondary | ICD-10-CM | POA: Diagnosis not present

## 2015-11-10 DIAGNOSIS — F411 Generalized anxiety disorder: Secondary | ICD-10-CM | POA: Diagnosis not present

## 2015-11-10 DIAGNOSIS — F3111 Bipolar disorder, current episode manic without psychotic features, mild: Secondary | ICD-10-CM

## 2015-11-10 DIAGNOSIS — J4540 Moderate persistent asthma, uncomplicated: Secondary | ICD-10-CM | POA: Diagnosis not present

## 2015-11-10 DIAGNOSIS — G43109 Migraine with aura, not intractable, without status migrainosus: Secondary | ICD-10-CM

## 2015-11-10 DIAGNOSIS — K2100 Gastro-esophageal reflux disease with esophagitis, without bleeding: Secondary | ICD-10-CM

## 2015-11-10 DIAGNOSIS — R739 Hyperglycemia, unspecified: Secondary | ICD-10-CM

## 2015-11-10 DIAGNOSIS — R5383 Other fatigue: Secondary | ICD-10-CM

## 2015-11-10 MED ORDER — ATORVASTATIN CALCIUM 40 MG PO TABS: 40.0000 mg | ORAL_TABLET | Freq: Every day | ORAL | 1 refills | Status: DC

## 2015-11-10 MED ORDER — VENLAFAXINE HCL ER 150 MG PO CP24: 150.0000 mg | ORAL_CAPSULE | Freq: Every day | ORAL | 0 refills | Status: DC

## 2015-11-10 MED ORDER — ARIPIPRAZOLE 10 MG PO TABS: 10.0000 mg | ORAL_TABLET | Freq: Every day | ORAL | 0 refills | Status: DC

## 2015-11-10 MED ORDER — MOMETASONE FURO-FORMOTEROL FUM 200-5 MCG/ACT IN AERO: 2.0000 | INHALATION_SPRAY | Freq: Two times a day (BID) | RESPIRATORY_TRACT | 0 refills | Status: DC

## 2015-11-10 NOTE — Progress Notes (Signed)
Name: Jacqueline Lucas   MRN: [id number]    DOB: May 23, 1950   Date:11/10/2015       Progress Note  Subjective  Chief Complaint  Chief Complaint  Patient presents with  . Hypertension    pt here for 3 month follow up  . Dizziness  . Insomnia    not sleeping well    HPI  Migraine headaches with aura: she was initially diagnosed at age 65 yo, she has seen neurologist at the headache center in the past, and tried multiple medications. She states her migraine is preceded by loss of vision and scotomas on left eye, followed by a pounding headache, associated with nausea and vomiting. Currently taking oxycodone for pain. She has photophobia, phonophobia and also sensitive to scents. Symptoms improves when she takes a nap. Episodes are more frequent at about twice weekly rate, for the past month.  HTN: she is doing well now, bp is at goal, no chest pain now and states palpitation not present since ablation  Angina: she has been taking ARB, aspirin and Atorvastatin, she states she has tightness in her chest about once a month and usually resolves by itself. She has occasional nausea, but no vomiting or diaphoresis. She states pain can occur during activity and improves with rest  Bipolar Disorder: she states that when she was an young adult she saw a psychiatrist and was diagnosed with bipolar disorder, she has been treated with anti-depressants and anxiety medication, she is just letting me know today of her previous diagnosed of Bipolar. She has two children that have bipolar. She has been unable to sleep for the past month, stays up all day and night, she has also been feeling more angry and snappy - husband has noticed the changes.   Asthma moderate: she is back on [facility] and has helped with SOB and wheezing. She has noticed some cough at night intermittently   S/p hip replacement right side: she was very anemic, we will check levels, she is feeling very tired  Dyslipidemia: continue  medication, she has muscle ache from FMS  Patient Active Problem List   Diagnosis Date Noted  . Hip arthritis 08/16/2015  . Microscopic hematuria 07/30/2015  . Vaginal atrophy 07/30/2015  . GAD (generalized anxiety disorder) 07/08/2015  . Moderate episode of recurrent major depressive disorder ([place]) 07/08/2015  . Migraine with aura and without status migrainosus, not intractable 07/08/2015  . Dyslipidemia 07/08/2015  . Degenerative arthritis of right knee 03/16/2015  . Atrophic vaginitis 01/31/2015  . Recurrent UTI 12/31/2014  . IBS (irritable bowel syndrome) 10/29/2014  . Asthma, moderate persistent 09/08/2014  . Allergic state 09/07/2014  . Colon polyp 09/07/2014  . Hemorrhoid 09/07/2014  . Constrictive tenosynovitis 03/12/2014  . H/O total knee replacement 07/31/2013  . Angina pectoris ([place]) 11/28/2012  . Anxiety 11/28/2012  . Acid reflux 11/28/2012  . Cardiac murmur 11/28/2012  . BP (high blood pressure) 11/28/2012  . Cannot sleep 11/28/2012  . Adiposity 11/28/2012  . Arthritis, degenerative 11/28/2012  . Restless leg 11/28/2012  . Supraventricular tachycardia ([place]) 11/28/2012  . Fibromyalgia 11/28/2012    Past Surgical History:  Procedure Laterality Date  . ABDOMINAL HYSTERECTOMY     due to cancer of ovaries  . BACK SURGERY N/A 2014   x 5  . CARDIAC CATHETERIZATION     no PCI; 12/18/12 ([facility], Dr. Marcello Moores): EP study with ablation. No coronary angiography done then.  . COLONOSCOPY W/ POLYPECTOMY    . HEMORRHOID SURGERY    .  JOINT REPLACEMENT Bilateral    knee  . REPLACEMENT TOTAL KNEE Left   . SPINE SURGERY     x 2  . tendonititis     right elbow, right wrist  . TOTAL HIP ARTHROPLASTY Left 2013  . TOTAL HIP ARTHROPLASTY Right 08/16/2015   Procedure: TOTAL HIP ARTHROPLASTY ANTERIOR APPROACH;  Surgeon: Meredith Pel, MD;  Location: [facility];  Service: Orthopedics;  Laterality: Right;  . TOTAL KNEE ARTHROPLASTY Right 03/16/2015   Procedure: RIGHT TOTAL KNEE  ARTHROPLASTY, PCL SACRIFICING;  Surgeon: Meredith Pel, MD;  Location: [facility];  Service: Orthopedics;  Laterality: Right;    Family History  Problem Relation Age of Onset  . Diabetes Mother   . Hypertension Mother   . Heart disease Mother   . Diabetes Father   . Cancer Father   . Hypertension Sister   . Depression Sister   . Cancer Brother   . Bladder Cancer Neg Hx   . Kidney disease Neg Hx     Social History   Social History  . Marital status: Married    Spouse name: N/A  . Number of children: N/A  . Years of education: N/A   Occupational History  . Not on file.   Social History Main Topics  . Smoking status: Never Smoker  . Smokeless tobacco: Never Used  . Alcohol use No  . Drug use: No  . Sexual activity: No   Other Topics Concern  . Not on file   Social History Narrative  . No narrative on file     Current Outpatient Prescriptions:  .  albuterol (PROAIR HFA) 108 (90 BASE) MCG/ACT inhaler, Inhale 2 puffs into the lungs every 6 (six) hours as needed for wheezing or shortness of breath., Disp: 1 Inhaler, Rfl: 1 .  ARIPiprazole (ABILIFY) 10 MG tablet, Take 1 tablet (10 mg total) by mouth daily., Disp: 30 tablet, Rfl: 0 .  aspirin EC 325 MG tablet, Take 325 mg by mouth at bedtime., Disp: , Rfl:  .  atorvastatin (LIPITOR) 40 MG tablet, Take 1 tablet (40 mg total) by mouth at bedtime., Disp: 90 tablet, Rfl: 1 .  baclofen (LIORESAL) 10 MG tablet, Take 10 mg by mouth 2 (two) times daily as needed for muscle spasms. Reported on 07/28/2015, Disp: , Rfl:  .  Cholecalciferol (D3-1000) 1000 units tablet, Take 1,000 Units by mouth at bedtime. , Disp: , Rfl:  .  clonazePAM (KLONOPIN) 0.5 MG tablet, Take 1 tablet (0.5 mg total) by mouth daily as needed for anxiety., Disp: 30 tablet, Rfl: 0 .  cycloSPORINE (RESTASIS) 0.05 % ophthalmic emulsion, Place 1 drop into both eyes 2 (two) times daily. , Disp: , Rfl:  .  gabapentin (NEURONTIN) 300 MG capsule, Take 900 mg by mouth 3  (three) times daily. , Disp: , Rfl:  .  lansoprazole (PREVACID) 30 MG capsule, Take 1 capsule (30 mg total) by mouth 2 (two) times daily before a meal., Disp: 180 capsule, Rfl: 3 .  levalbuterol (XOPENEX) 1.25 MG/3ML nebulizer solution, Take 1.25 mg by nebulization every 4 (four) hours as needed for wheezing., Disp: 72 mL, Rfl: 4 .  levothyroxine (SYNTHROID) 88 MCG tablet, Take 88 mcg by mouth daily before breakfast. , Disp: , Rfl:  .  magnesium oxide (MAG-OX) 400 MG tablet, Take 400 mg by mouth at bedtime. Reported on 07/28/2015, Disp: , Rfl:  .  mometasone-formoterol (DULERA) 200-5 MCG/ACT AERO, Inhale 2 puffs into the lungs 2 (two) times daily. Reported on  07/28/2015, Disp: 3 Inhaler, Rfl: 0 .  montelukast (SINGULAIR) 10 MG tablet, Take 1 tablet (10 mg total) by mouth at bedtime., Disp: 90 tablet, Rfl: 0 .  ondansetron (ZOFRAN) 4 MG tablet, Take 1 tablet (4 mg total) by mouth every 8 (eight) hours as needed for nausea or vomiting., Disp: 20 tablet, Rfl: 0 .  oxyCODONE (OXY IR/ROXICODONE) 5 MG immediate release tablet, Take 1-2 tablets (5-10 mg total) by mouth every 3 (three) hours as needed for breakthrough pain., Disp: 60 tablet, Rfl: 0 .  Polyethyl Glycol-Propyl Glycol 0.4-0.3 % SOLN, Place 3 drops into both eyes 2 (two) times daily as needed (dry eyes). Reported on 07/28/2015, Disp: , Rfl:  .  valsartan-hydrochlorothiazide (DIOVAN HCT) 320-12.5 MG tablet, Take 1 tablet by mouth daily., Disp: 90 tablet, Rfl: 0 .  venlafaxine XR (EFFEXOR-XR) 150 MG 24 hr capsule, Take 1 capsule (150 mg total) by mouth daily with breakfast., Disp: 90 capsule, Rfl: 0 .  vitamin B-12 (CYANOCOBALAMIN) 1000 MCG tablet, Take 1,000 mcg by mouth at bedtime., Disp: , Rfl:   Allergies  Allergen Reactions  . Morphine Nausea And Vomiting    Ok with nausea medication  . Diazepam Nausea And Vomiting  . Latex Rash  . Rash Away  [Petrolatum-Zinc Oxide] Rash and Swelling  . Salicylates Other (See Comments)    Other  reaction(s): Headache  . Tape Rash    Please use paper tape     ROS  Constitutional: Negative for fever, positive for weight change.  Respiratory: Positive  for mild intermittent cough and shortness of breath.   Cardiovascular: Stable  for chest pain, no  palpitations.  Gastrointestinal: Negative for abdominal pain, no bowel changes.  Musculoskeletal: Positive  for gait problem ( uses a cane ) or joint swelling.  Skin: Negative for rash.  Neurological: Negative for dizziness, positive for  headache.  No other specific complaints in a complete review of systems (except as listed in HPI above).  Objective  Vitals:   11/10/15 1402  BP: 132/60  Pulse: 87  Resp: 16  Temp: 98 F (36.7 C)  SpO2: 97%  Weight: 193 lb 5 oz (87.7 kg)  Height: 5' 2"  (1.575 m)    Body mass index is 35.36 kg/m.  Physical Exam  Constitutional: Patient appears well-developed and well-nourished. Obese  No distress.  HEENT: head atraumatic, normocephalic, pupils equal and reactive to light, neck supple, throat within normal limits Cardiovascular: Normal rate, regular rhythm and normal heart sounds.  No murmur heard. No BLE edema. Pulmonary/Chest: Effort normal and breath sounds normal. No respiratory distress. Abdominal: Soft.  There is no tenderness. Psychiatric: Patient has a normal mood and affect. behavior is normal. Judgment and thought content normal.  Recent Results (from the past 2160 hour(s))  CBC     Status: Abnormal   Collection Time: 08/17/15  3:06 AM  Result Value Ref Range   WBC 13.2 (H) 4.0 - 10.5 K/uL   RBC 3.60 (L) 3.87 - 5.11 MIL/uL   Hemoglobin 9.3 (L) 12.0 - 15.0 g/dL   HCT 29.9 (L) 36.0 - 46.0 %   MCV 83.1 78.0 - 100.0 fL   MCH 25.8 (L) 26.0 - 34.0 pg   MCHC 31.1 30.0 - 36.0 g/dL   RDW 17.0 (H) 11.5 - 15.5 %   Platelets 224 150 - 400 K/uL  Basic metabolic panel     Status: Abnormal   Collection Time: 08/17/15  3:06 AM  Result Value Ref Range   Sodium 138  135 - 145 mmol/L    Potassium 3.7 3.5 - 5.1 mmol/L   Chloride 104 101 - 111 mmol/L   CO2 27 22 - 32 mmol/L   Glucose, Bld 177 (H) 65 - 99 mg/dL   BUN 8 6 - 20 mg/dL   Creatinine, Ser 0.74 0.44 - 1.00 mg/dL   Calcium 8.7 (L) 8.9 - 10.3 mg/dL   GFR calc non Af Amer >60 >60 mL/min   GFR calc Af Amer >60 >60 mL/min    Comment: (NOTE) The eGFR has been calculated using the CKD EPI equation. This calculation has not been validated in all clinical situations. eGFR's persistently <60 mL/min signify possible Chronic Kidney Disease.    Anion gap 7 5 - 15  CBC     Status: Abnormal   Collection Time: 08/18/15  4:50 AM  Result Value Ref Range   WBC 9.8 4.0 - 10.5 K/uL   RBC 3.10 (L) 3.87 - 5.11 MIL/uL   Hemoglobin 7.9 (L) 12.0 - 15.0 g/dL   HCT 25.5 (L) 36.0 - 46.0 %   MCV 82.3 78.0 - 100.0 fL   MCH 25.5 (L) 26.0 - 34.0 pg   MCHC 31.0 30.0 - 36.0 g/dL   RDW 17.7 (H) 11.5 - 15.5 %   Platelets 212 150 - 400 K/uL      PHQ2/9: Depression screen [facility] 2/9 07/08/2015 12/22/2014 12/15/2014 10/29/2014 10/12/2014  Decreased Interest 0 0 0 0 0  Down, Depressed, Hopeless 0 0 0 0 0  PHQ - 2 Score 0 0 0 0 0     Fall Risk: Fall Risk  07/08/2015 12/22/2014 12/15/2014 10/29/2014 10/12/2014  Falls in the past year? No No No No No  Number falls in past yr: - - - - -  Injury with Fall? - - - - -  Risk Factor Category  - - - - -  Risk for fall due to : - - - - -      Functional Status Survey: Is the patient deaf or have difficulty hearing?: No Does the patient have difficulty seeing, even when wearing glasses/contacts?: No Does the patient have difficulty concentrating, remembering, or making decisions?: No Does the patient have difficulty walking or climbing stairs?: Yes Does the patient have difficulty dressing or bathing?: No Does the patient have difficulty doing errands alone such as visiting a doctor's office or shopping?: No    Assessment & Plan  1. Essential hypertension  - COMPLETE METABOLIC PANEL WITH  GFR - CBC with Differential/Platelet  2. Dyslipidemia  - Lipid panel  3. GAD (generalized anxiety disorder)  - venlafaxine XR (EFFEXOR-XR) 150 MG 24 hr capsule; Take 1 capsule (150 mg total) by mouth daily with breakfast.  Dispense: 90 capsule; Refill: 0  4. Moderate episode of recurrent major depressive disorder (HCC)  - venlafaxine XR (EFFEXOR-XR) 150 MG 24 hr capsule; Take 1 capsule (150 mg total) by mouth daily with breakfast.  Dispense: 90 capsule; Refill: 0  5. Asthma, moderate persistent, uncomplicated  - mometasone-formoterol (DULERA) 200-5 MCG/ACT AERO; Inhale 2 puffs into the lungs 2 (two) times daily. Reported on 07/28/2015  Dispense: 3 Inhaler; Refill: 0  6. Fibromyalgia  stable  7. Gastroesophageal reflux disease with esophagitis  Continue medication, discussed possible se effects  8. Angina pectoris [facility])  Sees cardiologist yearly, Dr. Clayborn Bigness and states symptoms are stable  9. Supraventricular tachycardia ([place])  Doing well since ablation   10. Migraine with aura and without status migrainosus, not intractable  A little  worse lately, it may be from the lack of sleep   11. Encounter for breast cancer screening other than mammogram  - MM Digital Screening; Future  12. Needs flu shot  - Flu vaccine HIGH DOSE PF  13. Bipolar 1 disorder, manic, mild (HCC)  - ARIPiprazole (ABILIFY) 10 MG tablet; Take 1 tablet (10 mg total) by mouth daily.  Dispense: 30 tablet; Refill: 0  14. History of iron deficiency anemia  - CBC with Differential/Platelet  15. Hyperglycemia  - Hemoglobin A1c  16. Vitamin D deficiency  - VITAMIN D 25 Hydroxy (Vit-D Deficiency, Fractures)  17. Other fatigue  - TSH

## 2015-11-11 ENCOUNTER — Telehealth: Payer: Self-pay | Admitting: Family Medicine

## 2015-11-11 ENCOUNTER — Other Ambulatory Visit: Payer: Self-pay | Admitting: Family Medicine

## 2015-11-11 MED ORDER — NYSTATIN 100000 UNIT/ML MT SUSP: 5.0000 mL | Freq: Four times a day (QID) | OROMUCOSAL | 0 refills | Status: DC

## 2015-11-11 NOTE — Telephone Encounter (Signed)
Patient need Nystatin prescription sent to local pharmacy instead of Mail Order.

## 2015-11-11 NOTE — Telephone Encounter (Signed)
Sent prescription

## 2015-11-16 ENCOUNTER — Telehealth: Payer: Self-pay

## 2015-11-16 NOTE — Telephone Encounter (Signed)
Please close chart

## 2015-11-16 NOTE — Telephone Encounter (Signed)
Patient husband called for patient and states the Nystatin is helping her thrush symptoms but it had no refills. The medication has improved the symptoms greatly but states she is still having pain from her tongue and a little coating left on her tongue. Please advise.

## 2015-11-17 ENCOUNTER — Other Ambulatory Visit: Payer: Self-pay | Admitting: Family Medicine

## 2015-11-17 MED ORDER — NYSTATIN 100000 UNIT/ML MT SUSP: 5.0000 mL | Freq: Four times a day (QID) | OROMUCOSAL | 0 refills | Status: DC

## 2015-11-25 ENCOUNTER — Other Ambulatory Visit: Payer: Self-pay | Admitting: Family Medicine

## 2015-11-25 DIAGNOSIS — I10 Essential (primary) hypertension: Secondary | ICD-10-CM

## 2015-11-26 ENCOUNTER — Ambulatory Visit: Payer: Medicare Other

## 2015-12-01 ENCOUNTER — Ambulatory Visit: Payer: Medicare Other

## 2015-12-07 ENCOUNTER — Telehealth: Payer: Self-pay | Admitting: Family Medicine

## 2015-12-07 ENCOUNTER — Other Ambulatory Visit: Payer: Self-pay | Admitting: Family Medicine

## 2015-12-07 DIAGNOSIS — F3111 Bipolar disorder, current episode manic without psychotic features, mild: Secondary | ICD-10-CM

## 2015-12-07 NOTE — Telephone Encounter (Signed)
She has follow up in2 days. Does she needs it?

## 2015-12-07 NOTE — Telephone Encounter (Signed)
Pt needs refill on Aripiprazole to be sent to [place].

## 2015-12-08 NOTE — Telephone Encounter (Signed)
Called back and spoke to husband, he thinks she will be ok until Friday but will call back if that is not the case.

## 2015-12-10 ENCOUNTER — Other Ambulatory Visit: Payer: Self-pay

## 2015-12-10 ENCOUNTER — Telehealth: Payer: Self-pay

## 2015-12-10 ENCOUNTER — Encounter (INDEPENDENT_AMBULATORY_CARE_PROVIDER_SITE_OTHER): Payer: Medicare Other | Admitting: Family Medicine

## 2015-12-10 ENCOUNTER — Other Ambulatory Visit: Payer: Self-pay | Admitting: Family Medicine

## 2015-12-10 ENCOUNTER — Encounter: Payer: Self-pay | Admitting: Family Medicine

## 2015-12-10 DIAGNOSIS — R5383 Other fatigue: Secondary | ICD-10-CM | POA: Diagnosis not present

## 2015-12-10 DIAGNOSIS — I10 Essential (primary) hypertension: Secondary | ICD-10-CM | POA: Diagnosis not present

## 2015-12-10 DIAGNOSIS — F3111 Bipolar disorder, current episode manic without psychotic features, mild: Secondary | ICD-10-CM

## 2015-12-10 DIAGNOSIS — E785 Hyperlipidemia, unspecified: Secondary | ICD-10-CM | POA: Diagnosis not present

## 2015-12-10 DIAGNOSIS — R739 Hyperglycemia, unspecified: Secondary | ICD-10-CM | POA: Diagnosis not present

## 2015-12-10 DIAGNOSIS — Z862 Personal history of diseases of the blood and blood-forming organs and certain disorders involving the immune mechanism: Secondary | ICD-10-CM | POA: Diagnosis not present

## 2015-12-10 DIAGNOSIS — E559 Vitamin D deficiency, unspecified: Secondary | ICD-10-CM | POA: Diagnosis not present

## 2015-12-10 LAB — COMPLETE METABOLIC PANEL WITH GFR
ALT: 13 U/L (ref 6–29)
AST: 14 U/L (ref 10–35)
Albumin: 4 g/dL (ref 3.6–5.1)
Alkaline Phosphatase: 105 U/L (ref 33–130)
BUN: 16 mg/dL (ref 7–25)
CO2: 25 mmol/L (ref 20–31)
Calcium: 9.2 mg/dL (ref 8.6–10.4)
Chloride: 105 mmol/L (ref 98–110)
Creat: 0.77 mg/dL (ref 0.50–0.99)
GFR, Est African American: >89 mL/min (ref >=60)
GFR, Est Non African American: 81 mL/min (ref >=60)
Glucose, Bld: 97 mg/dL (ref 65–99)
Potassium: 4.6 mmol/L (ref 3.5–5.3)
Sodium: 140 mmol/L (ref 135–146)
Total Bilirubin: 0.5 mg/dL (ref 0.2–1.2)
Total Protein: 6.6 g/dL (ref 6.1–8.1)

## 2015-12-10 LAB — CBC WITH DIFFERENTIAL/PLATELET
Basophils Absolute: 0 cells/uL (ref 0–200)
Basophils Relative: 0 %
Eosinophils Absolute: 134 cells/uL (ref 15–500)
Eosinophils Relative: 2 %
HCT: 32.3 % — ABNORMAL LOW (ref 35.0–45.0)
Hemoglobin: 9.9 g/dL — ABNORMAL LOW (ref 11.7–15.5)
Lymphocytes Relative: 23 %
Lymphs Abs: 1541 cells/uL (ref 850–3900)
MCH: 21.9 pg — ABNORMAL LOW (ref 27.0–33.0)
MCHC: 30.7 g/dL — ABNORMAL LOW (ref 32.0–36.0)
MCV: 71.5 fL — ABNORMAL LOW (ref 80.0–100.0)
MPV: 9.4 fL (ref 7.5–12.5)
Monocytes Absolute: 469 cells/uL (ref 200–950)
Monocytes Relative: 7 %
Neutro Abs: 4556 cells/uL (ref 1500–7800)
Neutrophils Relative %: 68 %
Platelets: 357 10*3/uL (ref 140–400)
RBC: 4.52 MIL/uL (ref 3.80–5.10)
RDW: 17.5 % — ABNORMAL HIGH (ref 11.0–15.0)
WBC: 6.7 10*3/uL (ref 3.8–10.8)

## 2015-12-10 LAB — LIPID PANEL
Cholesterol: 177 mg/dL (ref 125–200)
HDL: 52 mg/dL (ref >=46)
LDL Cholesterol: 77 mg/dL (ref <130)
Total CHOL/HDL Ratio: 3.4 Ratio (ref <=5.0)
Triglycerides: 239 mg/dL — ABNORMAL HIGH (ref <150)
VLDL: 48 mg/dL — ABNORMAL HIGH (ref <30)

## 2015-12-10 LAB — TSH: TSH: 0.42 mIU/L

## 2015-12-10 MED ORDER — ARIPIPRAZOLE 10 MG PO TABS: 10.0000 mg | ORAL_TABLET | Freq: Every day | ORAL | 0 refills | Status: DC

## 2015-12-10 NOTE — Progress Notes (Signed)
BP (!) 142/70   Pulse 92   Temp 98.1 F (36.7 C) (Oral)   Resp 14   Wt 192 lb 14.4 oz (87.5 kg)   SpO2 97%   BMI 35.28 kg/m    Subjective:    Patient ID: Jacqueline Lucas, female    DOB: 1951/02/05, 65 y.o.   MRN: [id number]  HPI: Jacqueline Lucas is a 65 y.o. female  Chief Complaint  Patient presents with  . Follow-up  . Depression  . Medication Refill   ** patient seen by her primary**  Depression screen [facility] 2/9 12/10/2015 07/08/2015 12/22/2014 12/15/2014 10/29/2014  Decreased Interest 0 0 0 0 0  Down, Depressed, Hopeless 0 0 0 0 0  PHQ - 2 Score 0 0 0 0 0    No flowsheet data found.  Relevant past medical, surgical, family and social history reviewed Past Medical History:  Diagnosis Date  . Allergy   . Allergy to adhesive tape    Allergy to paper tape as well  . Alzheimer disease   . Anemia   . Anginal pain ([place])   . Anxiety   . Arthritis   . Asthma   . Bronchitis   . Cataract   . Chronic nausea   . Claustrophobia   . Depression   . Dry eyes    uses Restasis  . Fibromyalgia   . GERD (gastroesophageal reflux disease)   . H/O bladder infections   . Heart murmur   . Hematuria   . Hyperlipidemia   . Hypertension   . Hypothyroidism   . IBS (irritable bowel syndrome)   . Insomnia   . Lumbar neuritis   . Migraines   . Obesity   . Palpitation   . PONV (postoperative nausea and vomiting)   . Shoulder fracture, right   . Tachycardia   . Thyroid disease    Past Surgical History:  Procedure Laterality Date  . ABDOMINAL HYSTERECTOMY     due to cancer of ovaries  . BACK SURGERY N/A 2014   x 5  . CARDIAC CATHETERIZATION     no PCI; 12/18/12 ([facility], Dr. Marcello Moores): EP study with ablation. No coronary angiography done then.  . COLONOSCOPY W/ POLYPECTOMY    . HEMORRHOID SURGERY    . JOINT REPLACEMENT Bilateral    knee  . REPLACEMENT TOTAL KNEE Left   . SPINE SURGERY     x 2  . tendonititis     right elbow, right wrist  . TOTAL HIP ARTHROPLASTY  Left 2013  . TOTAL HIP ARTHROPLASTY Right 08/16/2015   Procedure: TOTAL HIP ARTHROPLASTY ANTERIOR APPROACH;  Surgeon: Meredith Pel, MD;  Location: [facility];  Service: Orthopedics;  Laterality: Right;  . TOTAL KNEE ARTHROPLASTY Right 03/16/2015   Procedure: RIGHT TOTAL KNEE ARTHROPLASTY, PCL SACRIFICING;  Surgeon: Meredith Pel, MD;  Location: [facility];  Service: Orthopedics;  Laterality: Right;   Family History  Problem Relation Age of Onset  . Diabetes Mother   . Hypertension Mother   . Heart disease Mother   . Diabetes Father   . Cancer Father   . Hypertension Sister   . Depression Sister   . Cancer Brother   . Bladder Cancer Neg Hx   . Kidney disease Neg Hx    Social History  Substance Use Topics  . Smoking status: Never Smoker  . Smokeless tobacco: Never Used  . Alcohol use No    Interim medical history since last visit reviewed. Allergies and  medications reviewed  Review of Systems Per HPI unless specifically indicated above     Objective:    BP (!) 142/70   Pulse 92   Temp 98.1 F (36.7 C) (Oral)   Resp 14   Wt 192 lb 14.4 oz (87.5 kg)   SpO2 97%   BMI 35.28 kg/m   Wt Readings from Last 3 Encounters:  12/10/15 192 lb 14.4 oz (87.5 kg)  11/10/15 193 lb 5 oz (87.7 kg)  08/16/15 197 lb 14.4 oz (89.8 kg)    Physical Exam  Results for orders placed or performed during the hospital encounter of 08/16/15  CBC  Result Value Ref Range   WBC 13.2 (H) 4.0 - 10.5 K/uL   RBC 3.60 (L) 3.87 - 5.11 MIL/uL   Hemoglobin 9.3 (L) 12.0 - 15.0 g/dL   HCT 29.9 (L) 36.0 - 46.0 %   MCV 83.1 78.0 - 100.0 fL   MCH 25.8 (L) 26.0 - 34.0 pg   MCHC 31.1 30.0 - 36.0 g/dL   RDW 17.0 (H) 11.5 - 15.5 %   Platelets 224 150 - 400 K/uL  Basic metabolic panel  Result Value Ref Range   Sodium 138 135 - 145 mmol/L   Potassium 3.7 3.5 - 5.1 mmol/L   Chloride 104 101 - 111 mmol/L   CO2 27 22 - 32 mmol/L   Glucose, Bld 177 (H) 65 - 99 mg/dL   BUN 8 6 - 20 mg/dL   Creatinine, Ser  0.74 0.44 - 1.00 mg/dL   Calcium 8.7 (L) 8.9 - 10.3 mg/dL   GFR calc non Af Amer >60 >60 mL/min   GFR calc Af Amer >60 >60 mL/min   Anion gap 7 5 - 15  CBC  Result Value Ref Range   WBC 9.8 4.0 - 10.5 K/uL   RBC 3.10 (L) 3.87 - 5.11 MIL/uL   Hemoglobin 7.9 (L) 12.0 - 15.0 g/dL   HCT 25.5 (L) 36.0 - 46.0 %   MCV 82.3 78.0 - 100.0 fL   MCH 25.5 (L) 26.0 - 34.0 pg   MCHC 31.0 30.0 - 36.0 g/dL   RDW 17.7 (H) 11.5 - 15.5 %   Platelets 212 150 - 400 K/uL      Assessment & Plan:   Problem List Items Addressed This Visit    None    Visit Diagnoses   None.      Follow up plan: No Follow-up on file.  An after-visit summary was printed and given to the patient at [facility].  Please see the patient instructions which may contain other information and recommendations beyond what is mentioned above in the assessment and plan.  No orders of the defined types were placed in this encounter.   No orders of the defined types were placed in this encounter.

## 2015-12-10 NOTE — Telephone Encounter (Signed)
Would like her ambilify refilled she has rescheduled an appt for 12/14/15

## 2015-12-11 ENCOUNTER — Other Ambulatory Visit: Payer: Self-pay | Admitting: Family Medicine

## 2015-12-11 LAB — VITAMIN D 25 HYDROXY (VIT D DEFICIENCY, FRACTURES): Vit D, 25-Hydroxy: 25 ng/mL — ABNORMAL LOW (ref 30–100)

## 2015-12-11 LAB — HEMOGLOBIN A1C
Hgb A1c MFr Bld: 6.1 % — ABNORMAL HIGH (ref <5.7)
Mean Plasma Glucose: 128 mg/dL

## 2015-12-13 DIAGNOSIS — M461 Sacroiliitis, not elsewhere classified: Secondary | ICD-10-CM | POA: Diagnosis not present

## 2015-12-13 DIAGNOSIS — M47816 Spondylosis without myelopathy or radiculopathy, lumbar region: Secondary | ICD-10-CM | POA: Diagnosis not present

## 2015-12-13 DIAGNOSIS — Z6832 Body mass index (BMI) 32.0-32.9, adult: Secondary | ICD-10-CM | POA: Diagnosis not present

## 2015-12-13 DIAGNOSIS — M533 Sacrococcygeal disorders, not elsewhere classified: Secondary | ICD-10-CM | POA: Diagnosis not present

## 2015-12-14 ENCOUNTER — Ambulatory Visit: Payer: Medicare Other | Admitting: Family Medicine

## 2015-12-16 ENCOUNTER — Other Ambulatory Visit: Payer: Self-pay | Admitting: Family Medicine

## 2015-12-16 ENCOUNTER — Telehealth: Payer: Self-pay | Admitting: General Surgery

## 2015-12-16 DIAGNOSIS — Z1211 Encounter for screening for malignant neoplasm of colon: Secondary | ICD-10-CM

## 2015-12-16 DIAGNOSIS — D649 Anemia, unspecified: Secondary | ICD-10-CM

## 2015-12-16 NOTE — Telephone Encounter (Signed)
12-16-15 L/M ON H&C#'S FOR PT TO RET CALL & MAKE AN APPT WITH DR BYRNETT(MD ON [facility]) FOR ANEMIA & SCREENING COLONOSCOPY.?IF HAD ANY IN PAST/MEDCIARE & FED BCBS/MTH

## 2015-12-20 ENCOUNTER — Ambulatory Visit (INDEPENDENT_AMBULATORY_CARE_PROVIDER_SITE_OTHER): Payer: Self-pay | Admitting: Orthopedic Surgery

## 2015-12-22 ENCOUNTER — Encounter: Payer: Self-pay | Admitting: *Deleted

## 2015-12-22 DIAGNOSIS — M461 Sacroiliitis, not elsewhere classified: Secondary | ICD-10-CM | POA: Diagnosis not present

## 2015-12-28 ENCOUNTER — Telehealth: Payer: Self-pay | Admitting: Family Medicine

## 2015-12-28 NOTE — Telephone Encounter (Signed)
She needs to go to [facility] , may have had a stroke

## 2015-12-28 NOTE — Telephone Encounter (Signed)
Patient passed out Friday and did not go to ER. Did not hurt anything just skinned up her face a little. Light headed and no energy. Please return call

## 2015-12-29 NOTE — Telephone Encounter (Signed)
Tried contacting patient no answer, left voice message for patient to return call.

## 2015-12-31 ENCOUNTER — Telehealth: Payer: Self-pay | Admitting: Family Medicine

## 2015-12-31 ENCOUNTER — Other Ambulatory Visit: Payer: Self-pay | Admitting: Family Medicine

## 2015-12-31 MED ORDER — LANSOPRAZOLE 30 MG PO CPDR: 30.0000 mg | DELAYED_RELEASE_CAPSULE | Freq: Two times a day (BID) | ORAL | 3 refills | Status: DC

## 2015-12-31 NOTE — Telephone Encounter (Signed)
Pt needs refill on Prevacid to be sent to [facility] order 90 day supply.

## 2015-12-31 NOTE — Telephone Encounter (Signed)
Tried contacting patient again no answer.

## 2016-01-03 ENCOUNTER — Ambulatory Visit (INDEPENDENT_AMBULATORY_CARE_PROVIDER_SITE_OTHER): Payer: Medicare Other

## 2016-01-03 ENCOUNTER — Encounter (INDEPENDENT_AMBULATORY_CARE_PROVIDER_SITE_OTHER): Payer: Self-pay | Admitting: Orthopedic Surgery

## 2016-01-03 ENCOUNTER — Ambulatory Visit (INDEPENDENT_AMBULATORY_CARE_PROVIDER_SITE_OTHER): Payer: Medicare Other | Admitting: Orthopedic Surgery

## 2016-01-03 DIAGNOSIS — Z96641 Presence of right artificial hip joint: Secondary | ICD-10-CM

## 2016-01-03 DIAGNOSIS — M180 Bilateral primary osteoarthritis of first carpometacarpal joints: Secondary | ICD-10-CM | POA: Diagnosis not present

## 2016-01-03 DIAGNOSIS — I209 Angina pectoris, unspecified: Secondary | ICD-10-CM | POA: Diagnosis not present

## 2016-01-03 MED ORDER — OXYCODONE HCL 5 MG PO CAPS: 5.0000 mg | ORAL_CAPSULE | ORAL | 0 refills | Status: DC | PRN

## 2016-01-03 NOTE — Progress Notes (Signed)
Office Visit Note   Patient: Jacqueline Lucas           Date of Birth: 20-Mar-1950           MRN: [id number] Visit Date: 01/03/2016 Requested by: Ashok Norris, MD [address] [place], [place] [address] PCP: Ashok Norris, MD  Subjective: Chief Complaint  Patient presents with  . Right Hip - Routine Post Op, Pain   Right thumb pain HPI Jacqueline Lucas is a 65 year old female 4 months out right total hip replacement.  Still is having some groin pain when she walks around for prolonged period of time.  She's using a cane occasionally is not having any fevers and chills.  She is out of physical therapy.  She is recently developed anemia and is taking iron for that.  She has had some fainting episodes.  Denies any left-sided symptoms.  A short also states that she's having some right thumb pain.  Had injections a couple months ago which helped but that pain is returned particularly with pinching.  She has a splint area and              Review of Systems All systems reviewed are negative as they relate to the chief complaint within the history of present illness.  Patient denies  fevers or chills.    Assessment & Plan: Visit Diagnoses:  1. Presence of right artificial hip joint   2. Primary osteoarthritis of both first carpometacarpal joints     Plan: Unclear etiology of groin pain following total hip replacement.  No fevers or chills and normal radiographs.  I think it's possible that there may be some delayed incorporation of the post healing to the acetabular component.  Infections and other possibility but less likely.  The stem looks to be in good position.  In regards to the thumb to try her with some Pennsaid samples to see if that helps.  Could consider injections when she comes back in 4 months I am going to renew her pain medicine prescription  Follow-Up Instructions: Return in about 4 months (around 05/02/2016).   Orders:  Orders Placed This Encounter  Procedures  .  XR HIP UNILAT W OR W/O PELVIS 2-3 VIEWS RIGHT   Meds ordered this encounter  Medications  . oxycodone (OXY-IR) 5 MG capsule    Sig: Take 1 capsule (5 mg total) by mouth every 4 (four) hours as needed.    Dispense:  90 capsule    Refill:  0      Procedures: No procedures performed   Clinical Data: No additional findings.  Objective: Vital Signs: There were no vitals taken for this visit.  Physical Exam  Ortho Exam  Specialty Comments:  No specialty comments available.  Imaging: Xr Hip Unilat W Or W/o Pelvis 2-3 Views Right  Result Date: 01/03/2016 AP pelvis lateral right hip ordered and obtained.  Femoral component appears to be in good position.  Acetabular component also appears to be in good position with screws showing no lucency.  Slight lucency medially but it doesn't look like it is been any bone absorption.    PMFS History: Patient Active Problem List   Diagnosis Date Noted  . Hip arthritis 08/16/2015  . Microscopic hematuria 07/30/2015  . Vaginal atrophy 07/30/2015  . GAD (generalized anxiety disorder) 07/08/2015  . Moderate episode of recurrent major depressive disorder ([place]) 07/08/2015  . Migraine with aura and without status migrainosus, not intractable 07/08/2015  . Dyslipidemia 07/08/2015  .  Degenerative arthritis of right knee 03/16/2015  . Atrophic vaginitis 01/31/2015  . Recurrent UTI 12/31/2014  . IBS (irritable bowel syndrome) 10/29/2014  . Asthma, moderate persistent 09/08/2014  . Allergic state 09/07/2014  . Colon polyp 09/07/2014  . Hemorrhoid 09/07/2014  . Constrictive tenosynovitis 03/12/2014  . H/O total knee replacement 07/31/2013  . Angina pectoris ([place]) 11/28/2012  . Anxiety 11/28/2012  . Acid reflux 11/28/2012  . Cardiac murmur 11/28/2012  . BP (high blood pressure) 11/28/2012  . Cannot sleep 11/28/2012  . Adiposity 11/28/2012  . Arthritis, degenerative 11/28/2012  . Restless leg 11/28/2012  . Supraventricular tachycardia  ([place]) 11/28/2012  . Fibromyalgia 11/28/2012   Past Medical History:  Diagnosis Date  . Allergy   . Allergy to adhesive tape    Allergy to paper tape as well  . Alzheimer disease   . Anemia   . Anginal pain ([place])   . Anxiety   . Arthritis   . Asthma   . Bronchitis   . Cataract   . Chronic nausea   . Claustrophobia   . Depression   . Dry eyes    uses Restasis  . Fibromyalgia   . GERD (gastroesophageal reflux disease)   . H/O bladder infections   . Heart murmur   . Hematuria   . Hyperlipidemia   . Hypertension   . Hypothyroidism   . IBS (irritable bowel syndrome)   . Insomnia   . Lumbar neuritis   . Migraines   . Obesity   . Palpitation   . PONV (postoperative nausea and vomiting)   . Shoulder fracture, right   . Tachycardia   . Thyroid disease     Family History  Problem Relation Age of Onset  . Diabetes Mother   . Hypertension Mother   . Heart disease Mother   . Diabetes Father   . Cancer Father   . Hypertension Sister   . Depression Sister   . Cancer Brother   . Bladder Cancer Neg Hx   . Kidney disease Neg Hx     Past Surgical History:  Procedure Laterality Date  . ABDOMINAL HYSTERECTOMY     due to cancer of ovaries  . BACK SURGERY N/A 2014   x 5  . CARDIAC CATHETERIZATION     no PCI; 12/18/12 ([facility], Dr. Marcello Moores): EP study with ablation. No coronary angiography done then.  . COLONOSCOPY W/ POLYPECTOMY    . HEMORRHOID SURGERY    . JOINT REPLACEMENT Bilateral    knee  . REPLACEMENT TOTAL KNEE Left   . SPINE SURGERY     x 2  . tendonititis     right elbow, right wrist  . TOTAL HIP ARTHROPLASTY Left 2013  . TOTAL HIP ARTHROPLASTY Right 08/16/2015   Procedure: TOTAL HIP ARTHROPLASTY ANTERIOR APPROACH;  Surgeon: Meredith Pel, MD;  Location: [facility];  Service: Orthopedics;  Laterality: Right;  . TOTAL KNEE ARTHROPLASTY Right 03/16/2015   Procedure: RIGHT TOTAL KNEE ARTHROPLASTY, PCL SACRIFICING;  Surgeon: Meredith Pel, MD;  Location: [facility];   Service: Orthopedics;  Laterality: Right;   Social History   Occupational History  . Not on file.   Social History Main Topics  . Smoking status: Never Smoker  . Smokeless tobacco: Never Used  . Alcohol use No  . Drug use: No  . Sexual activity: No

## 2016-01-04 ENCOUNTER — Ambulatory Visit: Payer: Medicare Other | Admitting: Family Medicine

## 2016-01-04 ENCOUNTER — Telehealth: Payer: Self-pay | Admitting: Family Medicine

## 2016-01-04 ENCOUNTER — Encounter: Payer: Self-pay | Admitting: *Deleted

## 2016-01-04 NOTE — Telephone Encounter (Signed)
Pt had an appointment for today which she cancelled and rescheduled. If I recall it was for refills because you wanted to see her before filling anything else. Was rescheduled for the 14th

## 2016-01-04 NOTE — Telephone Encounter (Signed)
Pt needs refill on Abilify [facility] [address]. Also Prevacid needs to go to [facility] 90 day supply.

## 2016-01-06 ENCOUNTER — Other Ambulatory Visit: Payer: Self-pay | Admitting: Family Medicine

## 2016-01-06 ENCOUNTER — Ambulatory Visit: Payer: Medicare Other | Admitting: General Surgery

## 2016-01-06 DIAGNOSIS — F3111 Bipolar disorder, current episode manic without psychotic features, mild: Secondary | ICD-10-CM

## 2016-01-06 NOTE — Telephone Encounter (Signed)
Patient requesting refill of [facility].  

## 2016-01-10 DIAGNOSIS — M3500 Sicca syndrome, unspecified: Secondary | ICD-10-CM | POA: Insufficient documentation

## 2016-01-10 DIAGNOSIS — Z96643 Presence of artificial hip joint, bilateral: Secondary | ICD-10-CM | POA: Insufficient documentation

## 2016-01-10 DIAGNOSIS — M810 Age-related osteoporosis without current pathological fracture: Secondary | ICD-10-CM | POA: Insufficient documentation

## 2016-01-10 NOTE — Progress Notes (Signed)
Office Visit Note  Patient: Jacqueline Lucas             Date of Birth: 31-Aug-1950           MRN: [id number]             PCP: Ashok Norris, MD Referring: Ashok Norris, MD Visit Date: 01/13/2016 Occupation: _0 @    Subjective:   Bilateral hip pain  History of Present Illness: Jacqueline Lucas is a 65 y.o. right-handed female with fibromyalgia syndrome and osteoarthritis some. She states she had a right total hip replacement in June 2017 by Dr. Marlou Sa. She still continues to have pain and discomfort in her right hip and difficulty walking. She states she's been having pain in her bilateral trochanteric bursa area and would like to have cortisone injection. The pain has increased since the weather has become cold. She's been also having some discomfort in her bilateral CMC joints. Her fibromyalgia pain has increased and she's been experiencing some generalized pain.  Activities of Daily Living:  Patient reports morning stiffness for60 minutes.   Patient Reports nocturnal pain.  Difficulty dressing/grooming: Denies Difficulty climbing stairs: Reports Difficulty getting out of chair: Reports Difficulty using hands for taps, buttons, cutlery, and/or writing: Denies   Review of Systems  Constitutional: Positive for fatigue. Negative for night sweats, weight gain, weight loss and weakness.  HENT: Negative for mouth sores, trouble swallowing, trouble swallowing, mouth dryness and nose dryness.   Eyes: Negative for pain, redness, visual disturbance and dryness.  Respiratory: Negative for cough, shortness of breath and difficulty breathing.   Cardiovascular: Negative for chest pain, palpitations, hypertension, irregular heartbeat and swelling in legs/feet.  Gastrointestinal: Negative for blood in stool, constipation and diarrhea.  Endocrine: Negative for increased urination.  Genitourinary: Negative for vaginal dryness.  Musculoskeletal: Positive for arthralgias, joint pain,  myalgias, morning stiffness and myalgias. Negative for joint swelling, muscle weakness and muscle tenderness.  Skin: Negative for color change, rash, hair loss, skin tightness, ulcers and sensitivity to sunlight.  Allergic/Immunologic: Negative for susceptible to infections.  Neurological: Negative for dizziness, memory loss and night sweats.  Hematological: Negative for swollen glands.  Psychiatric/Behavioral: Positive for sleep disturbance. Negative for depressed mood. The patient is not nervous/anxious.     PMFS History:  Patient Active Problem List   Diagnosis Date Noted  . History of total hip replacement, left 01/10/2016  . Sicca ([place]) 01/10/2016  . Age related osteoporosis 01/10/2016  . Hip arthritis 08/16/2015  . Microscopic hematuria 07/30/2015  . Vaginal atrophy 07/30/2015  . GAD (generalized anxiety disorder) 07/08/2015  . Moderate episode of recurrent major depressive disorder ([place]) 07/08/2015  . Migraine with aura and without status migrainosus, not intractable 07/08/2015  . Dyslipidemia 07/08/2015  . Degenerative arthritis of right knee 03/16/2015  . Atrophic vaginitis 01/31/2015  . Recurrent UTI 12/31/2014  . IBS (irritable bowel syndrome) 10/29/2014  . Asthma, moderate persistent 09/08/2014  . Allergic state 09/07/2014  . Colon polyp 09/07/2014  . Hemorrhoid 09/07/2014  . Constrictive tenosynovitis 03/12/2014  . H/O total knee replacement, bilateral 07/31/2013  . Angina pectoris ([place]) 11/28/2012  . Anxiety 11/28/2012  . Acid reflux 11/28/2012  . Cardiac murmur 11/28/2012  . BP (high blood pressure) 11/28/2012  . Cannot sleep 11/28/2012  . Adiposity 11/28/2012  . Arthritis, degenerative 11/28/2012  . Restless leg 11/28/2012  . Supraventricular tachycardia ([place]) 11/28/2012  . Fibromyalgia 11/28/2012    Past Medical History:  Diagnosis Date  . Allergy   . Allergy  to adhesive tape    Allergy to paper tape as well  . Alzheimer disease   . Anemia   .  Anginal pain ([place])   . Anxiety   . Arthritis   . Asthma   . Bronchitis   . Cataract   . Chronic nausea   . Claustrophobia   . DDD (degenerative disc disease), lumbar   . Depression   . Dry eyes    uses Restasis  . Fibromyalgia   . GERD (gastroesophageal reflux disease)   . H/O bladder infections   . Heart murmur   . Hematuria   . Hyperlipidemia   . Hypertension   . Hypothyroidism   . IBS (irritable bowel syndrome)   . Insomnia   . Lumbar neuritis   . Migraines   . Obesity   . Osteoporosis   . Palpitation   . PONV (postoperative nausea and vomiting)   . RLS (restless legs syndrome)   . Shoulder fracture, right   . Sicca ([place])   . Tachycardia   . Thyroid disease     Family History  Problem Relation Age of Onset  . Diabetes Mother   . Hypertension Mother   . Heart disease Mother   . Diabetes Father   . Cancer Father   . Hypertension Sister   . Depression Sister   . Cancer Brother   . Bladder Cancer Neg Hx   . Kidney disease Neg Hx    Past Surgical History:  Procedure Laterality Date  . ABDOMINAL HYSTERECTOMY     due to cancer of ovaries  . BACK SURGERY N/A 2014   x 5  . CARDIAC CATHETERIZATION     no PCI; 12/18/12 ([facility], Dr. Marcello Moores): EP study with ablation. No coronary angiography done then.  . COLONOSCOPY W/ POLYPECTOMY    . HEMORRHOID SURGERY    . JOINT REPLACEMENT Bilateral    knee  . REPLACEMENT TOTAL KNEE Left   . SPINE SURGERY     x 2  . tendonititis     right elbow, right wrist  . TOTAL HIP ARTHROPLASTY Left 2013  . TOTAL HIP ARTHROPLASTY Right 08/16/2015   Procedure: TOTAL HIP ARTHROPLASTY ANTERIOR APPROACH;  Surgeon: Meredith Pel, MD;  Location: [facility];  Service: Orthopedics;  Laterality: Right;  . TOTAL KNEE ARTHROPLASTY Right 03/16/2015   Procedure: RIGHT TOTAL KNEE ARTHROPLASTY, PCL SACRIFICING;  Surgeon: Meredith Pel, MD;  Location: [facility];  Service: Orthopedics;  Laterality: Right;   Social History   Social History Narrative    . No narrative on file     Objective: Vital Signs: BP 132/65 (BP Location: Left Arm, Patient Position: Sitting, Cuff Size: Small)   Pulse 81   Resp 13   Ht _0  (1.626 m)   Wt 197 lb (89.4 kg)   LMP  (Exact Date)   BMI 33.81 kg/m    Physical Exam  Constitutional: She is oriented to person, place, and time. She appears well-developed and well-nourished.  HENT:  Head: Normocephalic and atraumatic.  Eyes: Conjunctivae and EOM are normal.  Neck: Normal range of motion.  Cardiovascular: Normal rate, regular rhythm, normal heart sounds and intact distal pulses.   Pulmonary/Chest: Effort normal and breath sounds normal.  Abdominal: Soft. Bowel sounds are normal.  Liver and spleen difficult to palpate due to body habitus  Lymphadenopathy:    She has no cervical adenopathy.  Neurological: She is alert and oriented to person, place, and time.  Skin: Skin is warm  and dry. Capillary refill takes less than 2 seconds.  Psychiatric: She has a normal mood and affect. Her behavior is normal.  Nursing note and vitals reviewed.    Musculoskeletal Exam: She is good range of motion of her C-spine and thoracic spine she is painful limited range of motion of her lumbar spine. She has good range of motion of bilateral shoulder joints bilateral elbow joints, bilateral wrist joints, bilateral MCP joints, she has thickening of bilateral PIP/DIP joints in her hands and feet consistent with osteoarthritis. No synovitis was noted. She has bilateral total hip replacement with painful limited range of motion of her right hip joint she's bilateral total knee replacements which causes some discomfort and some limitation in her knee joints. No effusion warmth was noted. She also has some tenderness over bilateral CMC joints. She has tenderness on palpation over bilateral trochanteric bursa.  CDAI Exam: No CDAI exam completed.    Investigation: Findings:  07/06/2006: ESR normal; CK normal; SPEP non-specific  increase in the beta-1 region; ANA negative    Imaging: Xr Hip Unilat W Or W/o Pelvis 2-3 Views Right  Result Date: 01/03/2016 AP pelvis lateral right hip ordered and obtained.  Femoral component appears to be in good position.  Acetabular component also appears to be in good position with screws showing no lucency.  Slight lucency medially but it doesn't look like it is been any bone absorption.   Speciality Comments: No specialty comments available.    Procedures:  Large Joint Inj Date/Time: 01/13/2016 2:00 PM Performed by: Bo Merino Authorized by: Bo Merino   Consent Given by:  Patient Site marked: the procedure site was marked   Timeout: prior to procedure the correct patient, procedure, and site was verified   Indications:  Pain Location:  Hip Site:  L greater trochanter Prep: patient was prepped and draped in usual sterile fashion   Needle Size:  27 G Needle Length:  1.5 inches Approach:  Lateral Ultrasound Guidance: No   Fluoroscopic Guidance: No   Arthrogram: No   Medications:  40 mg triamcinolone acetonide 40 MG/ML; 1.5 mL lidocaine 1 % Aspiration Attempted: No   Aspirate amount (mL):  0 Patient tolerance:  Patient tolerated the procedure well with no immediate complications Large Joint Inj Date/Time: 01/13/2016 2:01 PM Performed by: Bo Merino Authorized by: Bo Merino   Consent Given by:  Patient Site marked: the procedure site was marked   Timeout: prior to procedure the correct patient, procedure, and site was verified   Indications:  Pain Location:  Hip Site:  R greater trochanter Prep: patient was prepped and draped in usual sterile fashion   Needle Size:  27 G Needle Length:  1.5 inches Approach:  Lateral Ultrasound Guidance: No   Fluoroscopic Guidance: No   Arthrogram: No   Medications:  40 mg triamcinolone acetonide 40 MG/ML; 1.5 mL lidocaine 1 % Aspiration Attempted: No   Aspirate amount (mL):  0 Patient  tolerance:  Patient tolerated the procedure well with no immediate complications   Allergies: Morphine; Diazepam; Lyrica [pregabalin]; Latex; Rash away  [petrolatum-zinc oxide]; Salicylates; and Tape   Assessment / Plan: Visit Diagnoses: Fibromyalgia : She is having a flare since her right total hip replacement. She's having increased pain generalized discomfort him and positive tender points. We will refill her gabapentin per her request.  Bilateral trochanteric bursitis with severe pain, different treatment options and their side effects were discussed at her request will inject bilateral trochanteric bursa with cortisone injection as  described above.  H/O total knee replacement, bilateral: Mild chronic discomfort.  Bilateral total hip replacement: She had left total replacement in the past and right total hip replacement in June. She still having some discomfort in her right hip.  Primary osteoarthritis of both hands - Right CMC surgery: Joint protection and muscle strengthening discussed.  Degenerative disc disease of lumbar spine - Status post fusion: She has chronic pain.  Sicca symptoms: Over-the-counter products were discussed.  She has other medical problems which are listed below. She is been treated by her PCP for those.  Osteoporosis  Essential hypertension  Migraine with aura and without status migrainosus, not intractable  Restless leg    Orders: Orders Placed This Encounter  Procedures  . Large Joint Injection/Arthrocentesis  . Large Joint Injection/Arthrocentesis   Meds ordered this encounter  Medications  . gabapentin (NEURONTIN) 300 MG capsule    Sig: Take 1 capsule (300 mg total) by mouth 3 (three) times daily.    Dispense:  270 capsule    Refill:  1    Face-to-face time spent with patient was 30 minutes. 50% of time was spent in counseling and coordination of care.  Follow-Up Instructions: Return in about 6 months (around 07/12/2016) for  Osteoarthritis.   Bo Merino, MD

## 2016-01-11 ENCOUNTER — Ambulatory Visit (INDEPENDENT_AMBULATORY_CARE_PROVIDER_SITE_OTHER): Payer: Medicare Other | Admitting: Family Medicine

## 2016-01-11 ENCOUNTER — Encounter: Payer: Self-pay | Admitting: Family Medicine

## 2016-01-11 ENCOUNTER — Other Ambulatory Visit: Payer: Self-pay

## 2016-01-11 VITALS — BP 132/78 | HR 80 | Temp 97.7°F | Resp 16 | Ht 62.0 in | Wt 199.2 lb

## 2016-01-11 DIAGNOSIS — I209 Angina pectoris, unspecified: Secondary | ICD-10-CM

## 2016-01-11 DIAGNOSIS — R55 Syncope and collapse: Secondary | ICD-10-CM | POA: Diagnosis not present

## 2016-01-11 DIAGNOSIS — R42 Dizziness and giddiness: Secondary | ICD-10-CM | POA: Diagnosis not present

## 2016-01-11 DIAGNOSIS — D649 Anemia, unspecified: Secondary | ICD-10-CM

## 2016-01-11 DIAGNOSIS — I499 Cardiac arrhythmia, unspecified: Secondary | ICD-10-CM | POA: Diagnosis not present

## 2016-01-11 DIAGNOSIS — I10 Essential (primary) hypertension: Secondary | ICD-10-CM

## 2016-01-11 DIAGNOSIS — F3111 Bipolar disorder, current episode manic without psychotic features, mild: Secondary | ICD-10-CM

## 2016-01-11 MED ORDER — MONTELUKAST SODIUM 10 MG PO TABS: 10.0000 mg | ORAL_TABLET | Freq: Every day | ORAL | 1 refills | Status: DC

## 2016-01-11 MED ORDER — VALSARTAN-HYDROCHLOROTHIAZIDE 160-12.5 MG PO TABS: 1.0000 | ORAL_TABLET | Freq: Every day | ORAL | 0 refills | Status: DC

## 2016-01-11 MED ORDER — ARIPIPRAZOLE 10 MG PO TABS: 10.0000 mg | ORAL_TABLET | Freq: Every day | ORAL | 1 refills | Status: DC

## 2016-01-11 NOTE — Progress Notes (Signed)
Name: Jacqueline Lucas   MRN: [id number]    DOB: 1950/05/11   Date:01/11/2016       Progress Note  Subjective  Chief Complaint  Chief Complaint  Patient presents with  . Medication Refill  . Hypertension    been taking half a pill but she said it is hard to split and would like a capsule   . Dizziness    HPI  HTN: she is currently taking half pill of Diovan HCTZ, bp is at goal, no chest pain or palpitation  Dizziness: she feels light headed for the past couple of months. She states she feels tired, no tinnitus, hearing loss or migraine associated with symptoms, no weakness. She states that one week ago she was walking in her kitchen at 2 am and she felt dizzy, she sat down on a chair but fell from the chair, she is not sure how long she was out, husband was asleep. No associated chest pain or palpitation, no bowel or bladder incontinence, she recall all events prior to incident. BP has not been low at home, usually 130's/80's She did not seek medical help at the time, she has a neurologist ( headache clinic ) and cardiologist and we will refer her back  Bipolar disorder: she always had problems sleeping at night, but it has been worse since she ran out of Ability. She states that Abilify seems to help her mood, she still has anhedonia, discussed referral to psychiatrist.   Anemia: after hip surgery, advised to have colonoscopy and is already scheduled.   Patient Active Problem List   Diagnosis Date Noted  . History of total hip replacement, left 01/10/2016  . Sicca ([place]) 01/10/2016  . Age related osteoporosis 01/10/2016  . Hip arthritis 08/16/2015  . Microscopic hematuria 07/30/2015  . Vaginal atrophy 07/30/2015  . GAD (generalized anxiety disorder) 07/08/2015  . Moderate episode of recurrent major depressive disorder ([place]) 07/08/2015  . Migraine with aura and without status migrainosus, not intractable 07/08/2015  . Dyslipidemia 07/08/2015  . Degenerative arthritis of right  knee 03/16/2015  . Atrophic vaginitis 01/31/2015  . Recurrent UTI 12/31/2014  . IBS (irritable bowel syndrome) 10/29/2014  . Asthma, moderate persistent 09/08/2014  . Allergic state 09/07/2014  . Colon polyp 09/07/2014  . Hemorrhoid 09/07/2014  . Constrictive tenosynovitis 03/12/2014  . H/O total knee replacement, bilateral 07/31/2013  . Angina pectoris ([place]) 11/28/2012  . Anxiety 11/28/2012  . Acid reflux 11/28/2012  . Cardiac murmur 11/28/2012  . BP (high blood pressure) 11/28/2012  . Cannot sleep 11/28/2012  . Adiposity 11/28/2012  . Arthritis, degenerative 11/28/2012  . Restless leg 11/28/2012  . Supraventricular tachycardia ([place]) 11/28/2012  . Fibromyalgia 11/28/2012    Past Surgical History:  Procedure Laterality Date  . ABDOMINAL HYSTERECTOMY     due to cancer of ovaries  . BACK SURGERY N/A 2014   x 5  . CARDIAC CATHETERIZATION     no PCI; 12/18/12 ([facility], Dr. Marcello Moores): EP study with ablation. No coronary angiography done then.  . COLONOSCOPY W/ POLYPECTOMY    . HEMORRHOID SURGERY    . JOINT REPLACEMENT Bilateral    knee  . REPLACEMENT TOTAL KNEE Left   . SPINE SURGERY     x 2  . tendonititis     right elbow, right wrist  . TOTAL HIP ARTHROPLASTY Left 2013  . TOTAL HIP ARTHROPLASTY Right 08/16/2015   Procedure: TOTAL HIP ARTHROPLASTY ANTERIOR APPROACH;  Surgeon: Meredith Pel, MD;  Location: [facility];  Service: Orthopedics;  Laterality: Right;  . TOTAL KNEE ARTHROPLASTY Right 03/16/2015   Procedure: RIGHT TOTAL KNEE ARTHROPLASTY, PCL SACRIFICING;  Surgeon: Meredith Pel, MD;  Location: [facility];  Service: Orthopedics;  Laterality: Right;    Family History  Problem Relation Age of Onset  . Diabetes Mother   . Hypertension Mother   . Heart disease Mother   . Diabetes Father   . Cancer Father   . Hypertension Sister   . Depression Sister   . Cancer Brother   . Bladder Cancer Neg Hx   . Kidney disease Neg Hx     Social History   Social History  .  Marital status: Married    Spouse name: N/A  . Number of children: N/A  . Years of education: N/A   Occupational History  . Not on file.   Social History Main Topics  . Smoking status: Never Smoker  . Smokeless tobacco: Never Used  . Alcohol use No  . Drug use: No  . Sexual activity: No   Other Topics Concern  . Not on file   Social History Narrative  . No narrative on file     Current Outpatient Prescriptions:  .  albuterol (PROAIR HFA) 108 (90 BASE) MCG/ACT inhaler, Inhale 2 puffs into the lungs every 6 (six) hours as needed for wheezing or shortness of breath., Disp: 1 Inhaler, Rfl: 1 .  ARIPiprazole (ABILIFY) 10 MG tablet, Take 1 tablet (10 mg total) by mouth daily., Disp: 90 tablet, Rfl: 1 .  aspirin EC 325 MG tablet, Take 325 mg by mouth at bedtime., Disp: , Rfl:  .  atorvastatin (LIPITOR) 40 MG tablet, Take 1 tablet (40 mg total) by mouth at bedtime., Disp: 90 tablet, Rfl: 1 .  baclofen (LIORESAL) 10 MG tablet, Take 10 mg by mouth 2 (two) times daily as needed for muscle spasms. Reported on 07/28/2015, Disp: , Rfl:  .  Cholecalciferol (D3-1000) 1000 units tablet, Take 1,000 Units by mouth at bedtime. , Disp: , Rfl:  .  clonazePAM (KLONOPIN) 0.5 MG tablet, Take 1 tablet (0.5 mg total) by mouth daily as needed for anxiety., Disp: 30 tablet, Rfl: 0 .  cycloSPORINE (RESTASIS) 0.05 % ophthalmic emulsion, Place 1 drop into both eyes 2 (two) times daily. , Disp: , Rfl:  .  DULERA 200-5 MCG/ACT AERO, INHALE 2 PUFFS INTO THE LUNGS 2 (TWO) TIMES DAILY. REPORTED ON 07/28/2015, Disp: 1 Inhaler, Rfl: 0 .  ferrous sulfate 325 (65 FE) MG EC tablet, Take 325 mg by mouth 3 (three) times daily with meals., Disp: , Rfl:  .  gabapentin (NEURONTIN) 300 MG capsule, Take 900 mg by mouth 3 (three) times daily. , Disp: , Rfl:  .  lansoprazole (PREVACID) 30 MG capsule, Take 1 capsule (30 mg total) by mouth 2 (two) times daily before a meal., Disp: 180 capsule, Rfl: 3 .  levalbuterol (XOPENEX) 1.25  MG/3ML nebulizer solution, Take 1.25 mg by nebulization every 4 (four) hours as needed for wheezing., Disp: 72 mL, Rfl: 4 .  levothyroxine (SYNTHROID) 88 MCG tablet, Take 88 mcg by mouth daily before breakfast. , Disp: , Rfl:  .  magnesium oxide (MAG-OX) 400 MG tablet, Take 400 mg by mouth at bedtime. Reported on 07/28/2015, Disp: , Rfl:  .  montelukast (SINGULAIR) 10 MG tablet, Take 1 tablet (10 mg total) by mouth at bedtime., Disp: 90 tablet, Rfl: 1 .  nystatin (MYCOSTATIN) 100000 UNIT/ML suspension, Take 5 mLs (500,000 Units total) by mouth 4 (  four) times daily., Disp: 60 mL, Rfl: 0 .  ondansetron (ZOFRAN) 4 MG tablet, Take 1 tablet (4 mg total) by mouth every 8 (eight) hours as needed for nausea or vomiting., Disp: 20 tablet, Rfl: 0 .  oxycodone (OXY-IR) 5 MG capsule, Take 1 capsule (5 mg total) by mouth every 4 (four) hours as needed., Disp: 90 capsule, Rfl: 0 .  Polyethyl Glycol-Propyl Glycol 0.4-0.3 % SOLN, Place 3 drops into both eyes 2 (two) times daily as needed (dry eyes). Reported on 07/28/2015, Disp: , Rfl:  .  venlafaxine XR (EFFEXOR-XR) 150 MG 24 hr capsule, Take 1 capsule (150 mg total) by mouth daily with breakfast., Disp: 90 capsule, Rfl: 0 .  vitamin B-12 (CYANOCOBALAMIN) 1000 MCG tablet, Take 1,000 mcg by mouth at bedtime., Disp: , Rfl:   Allergies  Allergen Reactions  . Morphine Nausea And Vomiting    Ok with nausea medication  . Diazepam Nausea And Vomiting  . Latex Rash  . Rash Away  [Petrolatum-Zinc Oxide] Rash and Swelling  . Salicylates Other (See Comments)    Other reaction(s): Headache  . Tape Rash    Please use paper tape     ROS  Constitutional: Negative for fever or weight change.  Respiratory: Negative for cough and shortness of breath.   Cardiovascular: Negative for chest pain or palpitations.  Gastrointestinal: Negative for abdominal pain, no bowel changes.  Musculoskeletal: Positive  for gait problem no  joint swelling.  Skin: Negative for rash.   Neurological: Positive for dizziness and intermittent  headache.  No other specific complaints in a complete review of systems (except as listed in HPI above).  Objective  Vitals:   01/11/16 1345  BP: 132/78  Pulse: 80  Resp: 16  Temp: 97.7 F (36.5 C)  SpO2: 98%  Weight: 199 lb 3 oz (90.4 kg)  Height: _0  (1.575 m)    Body mass index is 36.43 kg/m.  Physical Exam  Constitutional: Patient appears well-developed and well-nourished. Obese No distress.  HEENT: head atraumatic, normocephalic, pupils equal and reactive to light, ears normal TM bilaterally, neck supple, throat within normal limits, no nystagmus Cardiovascular: Normal rate, she has some extra beats  normal heart sounds.  No murmur heard. No BLE edema. Pulmonary/Chest: Effort normal and breath sounds normal. No respiratory distress. Abdominal: Soft.  There is no tenderness. Psychiatric: Patient has a normal mood and affect. behavior is normal. Judgment and thought content normal. Neurologist: she is currently using a walker, afraid of falling - she was previously using a cane - until recent fall. , normal sensation, normal cranial nerves  Recent Results (from the past 2160 hour(s))  COMPLETE METABOLIC PANEL WITH GFR     Status: None   Collection Time: 12/10/15  3:41 PM  Result Value Ref Range   Sodium 140 135 - 146 mmol/L   Potassium 4.6 3.5 - 5.3 mmol/L   Chloride 105 98 - 110 mmol/L   CO2 25 20 - 31 mmol/L   Glucose, Bld 97 65 - 99 mg/dL   BUN 16 7 - 25 mg/dL   Creat 0.77 0.50 - 0.99 mg/dL    Comment:   For patients > or = 66 years of age: The upper reference limit for Creatinine is approximately 13% higher for people identified as African-American.      Total Bilirubin 0.5 0.2 - 1.2 mg/dL   Alkaline Phosphatase 105 33 - 130 U/L   AST 14 10 - 35 U/L   ALT 13 6 -  29 U/L   Total Protein 6.6 6.1 - 8.1 g/dL   Albumin 4.0 3.6 - 5.1 g/dL   Calcium 9.2 8.6 - 10.4 mg/dL   GFR, Est African American >89 >=60  mL/min   GFR, Est Non African American 81 >=60 mL/min  CBC with Differential/Platelet     Status: Abnormal   Collection Time: 12/10/15  3:41 PM  Result Value Ref Range   WBC 6.7 3.8 - 10.8 K/uL   RBC 4.52 3.80 - 5.10 MIL/uL   Hemoglobin 9.9 (L) 11.7 - 15.5 g/dL   HCT 32.3 (L) 35.0 - 45.0 %   MCV 71.5 (L) 80.0 - 100.0 fL   MCH 21.9 (L) 27.0 - 33.0 pg   MCHC 30.7 (L) 32.0 - 36.0 g/dL   RDW 17.5 (H) 11.0 - 15.0 %   Platelets 357 140 - 400 K/uL   MPV 9.4 7.5 - 12.5 fL   Neutro Abs 4,556 1,500 - 7,800 cells/uL   Lymphs Abs 1,541 850 - 3,900 cells/uL   Monocytes Absolute 469 200 - 950 cells/uL   Eosinophils Absolute 134 15 - 500 cells/uL   Basophils Absolute 0 0 - 200 cells/uL   Neutrophils Relative % 68 %   Lymphocytes Relative 23 %   Monocytes Relative 7 %   Eosinophils Relative 2 %   Basophils Relative 0 %   Smear Review Criteria for review not met   Lipid panel     Status: Abnormal   Collection Time: 12/10/15  3:41 PM  Result Value Ref Range   Cholesterol 177 125 - 200 mg/dL   Triglycerides 239 (H) <150 mg/dL   HDL 52 >=46 mg/dL   Total CHOL/HDL Ratio 3.4 <=5.0 Ratio   VLDL 48 (H) <30 mg/dL   LDL Cholesterol 77 <130 mg/dL    Comment:   Total Cholesterol/HDL Ratio:CHD Risk                        Coronary Heart Disease Risk Table                                        Men       Women          1/2 Average Risk              3.4        3.3              Average Risk              5.0        4.4           2X Average Risk              9.6        7.1           3X Average Risk             23.4       11.0 Use the calculated Patient Ratio above and the CHD Risk table  to determine the patient's CHD Risk.   TSH     Status: None   Collection Time: 12/10/15  3:41 PM  Result Value Ref Range   TSH 0.42 mIU/L    Comment:   Reference Range   > or = 20 Years  0.40-4.50   Pregnancy Range First trimester  0.26-2.66 Second trimester 0.55-2.73 Third trimester  0.43-2.91      Hemoglobin A1c     Status: Abnormal   Collection Time: 12/10/15  3:41 PM  Result Value Ref Range   Hgb A1c MFr Bld 6.1 (H) <5.7 %    Comment:   For someone without known diabetes, a hemoglobin A1c value between 5.7% and 6.4% is consistent with prediabetes and should be confirmed with a follow-up test.   For someone with known diabetes, a value <7% indicates that their diabetes is well controlled. A1c targets should be individualized based on duration of diabetes, age, co-morbid conditions and other considerations.   This assay result is consistent with an increased risk of diabetes.   Currently, no consensus exists regarding use of hemoglobin A1c for diagnosis of diabetes in children.      Mean Plasma Glucose 128 mg/dL  VITAMIN D 25 Hydroxy (Vit-D Deficiency, Fractures)     Status: Abnormal   Collection Time: 12/10/15  3:41 PM  Result Value Ref Range   Vit D, 25-Hydroxy 25 (L) 30 - 100 ng/mL    Comment: Vitamin D Status           25-OH Vitamin D        Deficiency                <20 ng/mL        Insufficiency         20 - 29 ng/mL        Optimal             > or = 30 ng/mL   For 25-OH Vitamin D testing on patients on D2-supplementation and patients for whom quantitation of D2 and D3 fractions is required, the QuestAssureD 25-OH VIT D, (D2,D3), LC/MS/MS is recommended: order code [id number] (patients > 2 yrs).      PHQ2/9: Depression screen [facility] 2/9 01/11/2016 12/10/2015 07/08/2015 12/22/2014 12/15/2014  Decreased Interest 0 0 0 0 0  Down, Depressed, Hopeless 0 0 0 0 0  PHQ - 2 Score 0 0 0 0 0    Fall Risk: Fall Risk  01/11/2016 12/10/2015 07/08/2015 12/22/2014 12/15/2014  Falls in the past year? Yes No No No No  Number falls in past yr: 1 - - - -  Injury with Fall? Yes - - - -  Risk Factor Category  High Fall Risk - - - -  Risk for fall due to : - - - - -  Follow up Falls evaluation completed - - - -      Functional Status Survey: Is the patient deaf or have  difficulty hearing?: No Does the patient have difficulty seeing, even when wearing glasses/contacts?: No Does the patient have difficulty concentrating, remembering, or making decisions?: No Does the patient have difficulty walking or climbing stairs?: Yes Does the patient have difficulty dressing or bathing?: No Does the patient have difficulty doing errands alone such as visiting a doctor's office or shopping?: No    Assessment & Plan  1. Essential hypertension  - valsartan-hydrochlorothiazide (DIOVAN-HCT) 160-12.5 MG tablet; Take 1 tablet by mouth daily.  Dispense: 90 tablet; Refill: 0 - Ambulatory referral to Cardiology  2. Pre-syncope  - Ambulatory referral to Neurology - Ambulatory referral to Cardiology  3. Anemia, mild  Pending colonoscopy   4. Dizziness  Explained the importance of calling 911 if recurrence of symptoms  - Ambulatory referral to Neurology  5. Bipolar 1 disorder, manic, mild ([facility])  Not controlled, still up  all night, anhedonia, no suicidal thoughts or ideation.  - ARIPiprazole (ABILIFY) 10 MG tablet; Take 1 tablet (10 mg total) by mouth daily.  Dispense: 90 tablet; Refill: 1 - Ambulatory referral to Psychiatry   6. Irregular heart beat   History of ablation, seems to be PVC's, follow up with Dr. Clayborn Bigness

## 2016-01-12 ENCOUNTER — Encounter: Payer: Self-pay | Admitting: *Deleted

## 2016-01-12 MED ORDER — LANSOPRAZOLE 30 MG PO CPDR: 30.0000 mg | DELAYED_RELEASE_CAPSULE | Freq: Two times a day (BID) | ORAL | 3 refills | Status: DC

## 2016-01-13 ENCOUNTER — Encounter: Payer: Self-pay | Admitting: Rheumatology

## 2016-01-13 ENCOUNTER — Ambulatory Visit (INDEPENDENT_AMBULATORY_CARE_PROVIDER_SITE_OTHER): Payer: Medicare Other | Admitting: Rheumatology

## 2016-01-13 VITALS — BP 132/65 | HR 81 | Resp 13 | Ht 64.0 in | Wt 197.0 lb

## 2016-01-13 DIAGNOSIS — M47816 Spondylosis without myelopathy or radiculopathy, lumbar region: Secondary | ICD-10-CM | POA: Diagnosis not present

## 2016-01-13 DIAGNOSIS — M7061 Trochanteric bursitis, right hip: Secondary | ICD-10-CM | POA: Diagnosis not present

## 2016-01-13 DIAGNOSIS — G43109 Migraine with aura, not intractable, without status migrainosus: Secondary | ICD-10-CM

## 2016-01-13 DIAGNOSIS — G2581 Restless legs syndrome: Secondary | ICD-10-CM | POA: Diagnosis not present

## 2016-01-13 DIAGNOSIS — M3500 Sicca syndrome, unspecified: Secondary | ICD-10-CM | POA: Diagnosis not present

## 2016-01-13 DIAGNOSIS — M7062 Trochanteric bursitis, left hip: Secondary | ICD-10-CM

## 2016-01-13 DIAGNOSIS — M810 Age-related osteoporosis without current pathological fracture: Secondary | ICD-10-CM

## 2016-01-13 DIAGNOSIS — M19041 Primary osteoarthritis, right hand: Secondary | ICD-10-CM

## 2016-01-13 DIAGNOSIS — I10 Essential (primary) hypertension: Secondary | ICD-10-CM | POA: Diagnosis not present

## 2016-01-13 DIAGNOSIS — I209 Angina pectoris, unspecified: Secondary | ICD-10-CM

## 2016-01-13 DIAGNOSIS — Z96643 Presence of artificial hip joint, bilateral: Secondary | ICD-10-CM

## 2016-01-13 DIAGNOSIS — Z96653 Presence of artificial knee joint, bilateral: Secondary | ICD-10-CM

## 2016-01-13 DIAGNOSIS — M1611 Unilateral primary osteoarthritis, right hip: Secondary | ICD-10-CM

## 2016-01-13 DIAGNOSIS — M19042 Primary osteoarthritis, left hand: Secondary | ICD-10-CM

## 2016-01-13 DIAGNOSIS — M797 Fibromyalgia: Secondary | ICD-10-CM | POA: Diagnosis not present

## 2016-01-13 MED ORDER — GABAPENTIN 300 MG PO CAPS: 300.0000 mg | ORAL_CAPSULE | Freq: Three times a day (TID) | ORAL | 1 refills | Status: DC

## 2016-01-13 MED ORDER — LIDOCAINE HCL 1 % IJ SOLN
1.5000 mL | INTRAMUSCULAR | Status: AC | PRN
  Administered 2016-01-13: 1.5 mL

## 2016-01-13 MED ORDER — TRIAMCINOLONE ACETONIDE 40 MG/ML IJ SUSP
40.0000 mg | INTRAMUSCULAR | Status: AC | PRN
  Administered 2016-01-13: 40 mg via INTRA_ARTICULAR

## 2016-01-18 ENCOUNTER — Telehealth: Payer: Self-pay | Admitting: Rheumatology

## 2016-01-18 ENCOUNTER — Other Ambulatory Visit: Payer: Self-pay | Admitting: Rheumatology

## 2016-01-18 MED ORDER — GABAPENTIN 300 MG PO CAPS: 900.0000 mg | ORAL_CAPSULE | Freq: Three times a day (TID) | ORAL | 0 refills | Status: DC

## 2016-01-18 NOTE — Telephone Encounter (Signed)
Patient went to pick up her gabapenton today and it was written for 3 tablets daily for a 90 day supply. This patient has been taking 3 tablets 3 times a day and it needs to be sent to [facility] not [facility] on [address]. Please call patient back at [phone]

## 2016-01-18 NOTE — Telephone Encounter (Signed)
I spoke to patient she states she has tried to taper in the past and has been unable to reduce the dose. You have had her on this dose since 2012. Have resent for her for this dosage.

## 2016-01-18 NOTE — Addendum Note (Signed)
Addended byCandice Camp on: 01/18/2016 03:11 PM   Modules accepted: Orders

## 2016-01-18 NOTE — Telephone Encounter (Signed)
Dr D wants her to try to taper. I have called her to advise

## 2016-01-18 NOTE — Telephone Encounter (Signed)
Thank you, I have called to advise. And discuss taper, per our conversation will send in a taper dose. Left message for him to call me back

## 2016-01-18 NOTE — Telephone Encounter (Signed)
Gabapentin (NEURONTIN) 300 MG capsule     Sig: Take 1 capsule (300 mg total) by mouth 3 (three) times daily.    Dispense:  270 capsule    Refill:  1   Her previous was 3 tid, ok to send in for 3 tid #810 ?

## 2016-01-18 NOTE — Telephone Encounter (Signed)
Please discussed tapering dose of possible

## 2016-01-18 NOTE — Telephone Encounter (Signed)
Patient's husband Fritz Pickerel, left message and says patient got rx for gabapentin at her last visit and he thinks they did it wrong? Please call patient's husband. [phone]

## 2016-01-26 ENCOUNTER — Encounter: Payer: Self-pay | Admitting: *Deleted

## 2016-01-26 ENCOUNTER — Other Ambulatory Visit: Payer: Self-pay | Admitting: Nurse Practitioner

## 2016-01-26 DIAGNOSIS — E6609 Other obesity due to excess calories: Secondary | ICD-10-CM | POA: Diagnosis not present

## 2016-01-26 DIAGNOSIS — R42 Dizziness and giddiness: Secondary | ICD-10-CM | POA: Diagnosis not present

## 2016-01-26 DIAGNOSIS — Z6834 Body mass index (BMI) 34.0-34.9, adult: Secondary | ICD-10-CM | POA: Diagnosis not present

## 2016-01-26 DIAGNOSIS — Z8669 Personal history of other diseases of the nervous system and sense organs: Secondary | ICD-10-CM | POA: Diagnosis not present

## 2016-01-31 DIAGNOSIS — I10 Essential (primary) hypertension: Secondary | ICD-10-CM | POA: Diagnosis not present

## 2016-01-31 DIAGNOSIS — M461 Sacroiliitis, not elsewhere classified: Secondary | ICD-10-CM | POA: Diagnosis not present

## 2016-01-31 DIAGNOSIS — M533 Sacrococcygeal disorders, not elsewhere classified: Secondary | ICD-10-CM | POA: Diagnosis not present

## 2016-01-31 DIAGNOSIS — Z6833 Body mass index (BMI) 33.0-33.9, adult: Secondary | ICD-10-CM | POA: Diagnosis not present

## 2016-02-01 DIAGNOSIS — R42 Dizziness and giddiness: Secondary | ICD-10-CM | POA: Diagnosis not present

## 2016-02-01 DIAGNOSIS — R55 Syncope and collapse: Secondary | ICD-10-CM | POA: Diagnosis not present

## 2016-02-01 DIAGNOSIS — F411 Generalized anxiety disorder: Secondary | ICD-10-CM | POA: Diagnosis not present

## 2016-02-01 DIAGNOSIS — R011 Cardiac murmur, unspecified: Secondary | ICD-10-CM | POA: Diagnosis not present

## 2016-02-01 DIAGNOSIS — I10 Essential (primary) hypertension: Secondary | ICD-10-CM | POA: Diagnosis not present

## 2016-02-01 DIAGNOSIS — E079 Disorder of thyroid, unspecified: Secondary | ICD-10-CM | POA: Diagnosis not present

## 2016-02-01 DIAGNOSIS — I471 Supraventricular tachycardia: Secondary | ICD-10-CM | POA: Diagnosis not present

## 2016-02-01 DIAGNOSIS — E669 Obesity, unspecified: Secondary | ICD-10-CM | POA: Diagnosis not present

## 2016-02-02 ENCOUNTER — Ambulatory Visit (INDEPENDENT_AMBULATORY_CARE_PROVIDER_SITE_OTHER): Payer: Medicare Other | Admitting: General Surgery

## 2016-02-02 ENCOUNTER — Encounter: Payer: Self-pay | Admitting: General Surgery

## 2016-02-02 ENCOUNTER — Telehealth: Payer: Self-pay | Admitting: Family Medicine

## 2016-02-02 VITALS — BP 142/62 | HR 84 | Resp 16 | Ht 64.0 in | Wt 202.0 lb

## 2016-02-02 DIAGNOSIS — D649 Anemia, unspecified: Secondary | ICD-10-CM

## 2016-02-02 DIAGNOSIS — I209 Angina pectoris, unspecified: Secondary | ICD-10-CM | POA: Diagnosis not present

## 2016-02-02 MED ORDER — POLYETHYLENE GLYCOL 3350 17 GM/SCOOP PO POWD: ORAL | 0 refills | Status: DC

## 2016-02-02 NOTE — Telephone Encounter (Signed)
Pt needs refill on Abilify to be sent to [facility]. 90 day supply

## 2016-02-02 NOTE — Progress Notes (Signed)
Patient ID: Jacqueline Lucas, female   DOB: 1950-05-09, 65 y.o.   MRN: [id number]  Chief Complaint  Patient presents with  . Colonoscopy    HPI Jacqueline Lucas is a 65 y.o. female Here today for a evaluation of a screening colonoscopy. Last colonoscopy was in 04/24/2012. No GI problems at this time. Moves her bowels every 2 days and denies bleeding. She states she having some dizziness for the last two months and passed out 2 weeks ago. She has an upcoming appointment with ENT next week. Denies double vision. She has been watching what she eats and has lost 43 pounds over 5 months. She is currently wearing a cardiac monitor and being followed by Dr Clayborn Bigness.  HPI  Past Medical History:  Diagnosis Date  . Allergy   . Allergy to adhesive tape    Allergy to paper tape as well  . Alzheimer disease   . Anemia   . Anginal pain ([place])   . Anxiety   . Arthritis   . Asthma   . Bronchitis   . Cataract   . Chronic nausea   . Claustrophobia   . DDD (degenerative disc disease), lumbar   . Depression   . Dry eyes    uses Restasis  . Fibromyalgia   . GERD (gastroesophageal reflux disease)   . H/O bladder infections   . Heart murmur   . Hematuria   . Hyperlipidemia   . Hypertension   . Hypothyroidism   . IBS (irritable bowel syndrome)   . Insomnia   . Lumbar neuritis   . Migraines   . Obesity   . Osteoporosis   . Palpitation   . PONV (postoperative nausea and vomiting)   . RLS (restless legs syndrome)   . Shoulder fracture, right   . Sicca ([place])   . Tachycardia   . Thyroid disease     Past Surgical History:  Procedure Laterality Date  . ABDOMINAL HYSTERECTOMY     due to cancer of ovaries  . BACK SURGERY N/A 2014   x 5  . CARDIAC CATHETERIZATION     no PCI; 12/18/12 ([facility], Dr. Marcello Moores): EP study with ablation. No coronary angiography done then.  . COLONOSCOPY W/ POLYPECTOMY    . HEMORRHOID SURGERY    . JOINT REPLACEMENT Bilateral    knee  . REPLACEMENT TOTAL KNEE  Left   . SPINE SURGERY     x 2  . tendonititis     right elbow, right wrist  . TOTAL HIP ARTHROPLASTY Left 2013  . TOTAL HIP ARTHROPLASTY Right 08/16/2015   Procedure: TOTAL HIP ARTHROPLASTY ANTERIOR APPROACH;  Surgeon: Meredith Pel, MD;  Location: [facility];  Service: Orthopedics;  Laterality: Right;  . TOTAL KNEE ARTHROPLASTY Right 03/16/2015   Procedure: RIGHT TOTAL KNEE ARTHROPLASTY, PCL SACRIFICING;  Surgeon: Meredith Pel, MD;  Location: [facility];  Service: Orthopedics;  Laterality: Right;    Family History  Problem Relation Age of Onset  . Diabetes Mother   . Hypertension Mother   . Heart disease Mother   . Diabetes Father   . Cancer Father   . Hypertension Sister   . Depression Sister   . Cancer Brother   . Bladder Cancer Neg Hx   . Kidney disease Neg Hx     Social History Social History  Substance Use Topics  . Smoking status: Never Smoker  . Smokeless tobacco: Never Used  . Alcohol use No    Allergies  Allergen Reactions  .  Morphine Nausea And Vomiting    Ok with nausea medication  . Diazepam Nausea And Vomiting  . Lyrica [Pregabalin] Other (See Comments)    Joint swelling  . Latex Rash  . Rash Away  [Petrolatum-Zinc Oxide] Rash and Swelling  . Salicylates Other (See Comments)    Other reaction(s): Headache  . Tape Rash    Please use paper tape    Current Outpatient Prescriptions  Medication Sig Dispense Refill  . albuterol (PROAIR HFA) 108 (90 BASE) MCG/ACT inhaler Inhale 2 puffs into the lungs every 6 (six) hours as needed for wheezing or shortness of breath. 1 Inhaler 1  . ARIPiprazole (ABILIFY) 10 MG tablet Take 1 tablet (10 mg total) by mouth daily. 90 tablet 1  . aspirin 81 MG chewable tablet Chew 81 mg by mouth daily.    [name] atorvastatin (LIPITOR) 40 MG tablet Take 1 tablet (40 mg total) by mouth at bedtime. 90 tablet 1  . baclofen (LIORESAL) 10 MG tablet Take 10 mg by mouth 2 (two) times daily as needed for muscle spasms. Reported on 07/28/2015     . Cholecalciferol (D3-1000) 1000 units tablet Take 1,000 Units by mouth at bedtime.     . clonazePAM (KLONOPIN) 0.5 MG tablet Take 1 tablet (0.5 mg total) by mouth daily as needed for anxiety. 30 tablet 0  . cyclobenzaprine (FLEXERIL) 10 MG tablet Take 10 mg by mouth at bedtime.    . cycloSPORINE (RESTASIS) 0.05 % ophthalmic emulsion Place 1 drop into both eyes 2 (two) times daily.     . diclofenac sodium (VOLTAREN) 1 % GEL Apply 3 g topically 3 (three) times daily as needed.    . DULERA 200-5 MCG/ACT AERO INHALE 2 PUFFS INTO THE LUNGS 2 (TWO) TIMES DAILY. REPORTED ON 07/28/2015 1 Inhaler 0  . fenofibrate (TRICOR) 145 MG tablet Take 145 mg by mouth daily.    . ferrous sulfate 325 (65 FE) MG EC tablet Take 325 mg by mouth 3 (three) times daily with meals.    . gabapentin (NEURONTIN) 300 MG capsule Take 3 capsules (900 mg total) by mouth 3 (three) times daily. 810 capsule 0  . lansoprazole (PREVACID) 30 MG capsule Take 1 capsule (30 mg total) by mouth 2 (two) times daily before a meal. 180 capsule 3  . levalbuterol (XOPENEX) 1.25 MG/3ML nebulizer solution Take 1.25 mg by nebulization every 4 (four) hours as needed for wheezing. 72 mL 4  . levothyroxine (SYNTHROID) 88 MCG tablet Take 88 mcg by mouth daily before breakfast.     . magnesium oxide (MAG-OX) 400 MG tablet Take 400 mg by mouth at bedtime. Reported on 07/28/2015    . methocarbamol (ROBAXIN) 500 MG tablet Take 500 mg by mouth every 8 (eight) hours as needed.     . metoprolol tartrate (LOPRESSOR) 25 MG tablet Take 25 mg by mouth 2 (two) times daily.    . montelukast (SINGULAIR) 10 MG tablet Take 1 tablet (10 mg total) by mouth at bedtime. 90 tablet 1  . nystatin (MYCOSTATIN) 100000 UNIT/ML suspension Take 5 mLs (500,000 Units total) by mouth 4 (four) times daily. 60 mL 0  . ondansetron (ZOFRAN) 4 MG tablet Take 1 tablet (4 mg total) by mouth every 8 (eight) hours as needed for nausea or vomiting. 20 tablet 0  . oxycodone (OXY-IR) 5 MG capsule  Take 1 capsule (5 mg total) by mouth every 4 (four) hours as needed. 90 capsule 0  . Polyethyl Glycol-Propyl Glycol 0.4-0.3 % SOLN Place 3  drops into both eyes 2 (two) times daily as needed (dry eyes). Reported on 07/28/2015    . Tetrahydrozoline HCl (EYE DROPS OP) Apply to eye.    . valsartan-hydrochlorothiazide (DIOVAN-HCT) 160-12.5 MG tablet Take 1 tablet by mouth daily. 90 tablet 0  . venlafaxine XR (EFFEXOR-XR) 150 MG 24 hr capsule Take 1 capsule (150 mg total) by mouth daily with breakfast. 90 capsule 0  . vitamin B-12 (CYANOCOBALAMIN) 1000 MCG tablet Take 1,000 mcg by mouth at bedtime.    . polyethylene glycol powder (GLYCOLAX/MIRALAX) powder 255 grams one bottle for colonoscopy prep 255 g 0   Current Facility-Administered Medications  Medication Dose Route Frequency Provider Last Rate Last Dose  . ceFAZolin (ANCEF) 2 g in dextrose 5 % 100 mL IVPB  2 g Intravenous Once Robert Bellow, MD        Review of Systems Review of Systems  Constitutional: Negative.   Respiratory: Negative.   Cardiovascular: Negative.   Neurological: Positive for dizziness and syncope.    Blood pressure (!) 142/62, pulse 84, resp. rate 16, height 5\' 4"  (1.626 m), weight 202 lb (91.6 kg).  Physical Exam Physical Exam  Constitutional: She is oriented to person, place, and time. She appears well-developed and well-nourished.  HENT:  Mouth/Throat: Oropharynx is clear and moist.  Eyes: Conjunctivae are normal. No scleral icterus.  Neck: Neck supple.  Neck nodule present  Cardiovascular: Normal rate, regular rhythm and normal heart sounds.   Pulmonary/Chest: Effort normal and breath sounds normal.  Abdominal: Soft. Bowel sounds are normal. There is no tenderness.  Lymphadenopathy:    She has no cervical adenopathy.  Neurological: She is alert and oriented to person, place, and time.  Skin: Skin is warm and dry.  Psychiatric: Her behavior is normal.    Data Reviewed CBC dated 12/10/2015 showed a  hemoglobin of 9.9, up from a nadir of 7.9. MCV 71.5. Previously 61. Lela Kathryn 57,000. White blood cell count 6700.  Electrolytes and renal function normal. Liver function studies normal same date.  Colonoscopy dated 04/24/2012 completed by Verdie Shire, M.D. was normal.  EGD dated 05/15/2011 completed for chronic cough and suspected reflux was normal. Biopsies showed mild, chronic nonspecific inflammation without, blood cell metaplasia.  Assessment    New-onset microcytic anemia.  Dizziness, likely inner ear, ENT evaluation pending.    Plan         Upper endoscopy and Colonoscopy with possible biopsy/polypectomy prn: Information regarding the procedure, including its potential risks and complications (including but not limited to perforation of the bowel, which may require emergency surgery to repair, and bleeding) was verbally given to the patient. Educational information regarding lower intestinal endoscopy was given to the patient. Written instructions for how to complete the bowel prep using Miralax were provided. The importance of drinking ample fluids to avoid dehydration as a result of the prep emphasized.  In light of her recent orthopedic procedure, the patient will be asked to make use of amoxicillin, 2 g one hour prior to the procedure.   Patient has been scheduled for a colonoscopy on 02-04-16 at [facility]. This patient will take Dulcolax 10 mg twice today in addition to Miralax prep.   This information has been scribed by Karie Fetch RN, BSN,BC.     Robert Bellow 02/03/2016, 8:51 AM

## 2016-02-02 NOTE — Patient Instructions (Addendum)
Colonoscopy, Adult A colonoscopy is an exam to look at the entire large intestine. During the exam, a lubricated, bendable tube is inserted into the anus and then passed into the rectum, colon, and other parts of the large intestine. A colonoscopy is often done as a part of normal colorectal screening or in response to certain symptoms, such as anemia, persistent diarrhea, abdominal pain, and blood in the stool. The exam can help screen for and diagnose medical problems, including:  Tumors.  Polyps.  Inflammation.  Areas of bleeding. Tell a health care provider about:  Any allergies you have.  All medicines you are taking, including vitamins, herbs, eye drops, creams, and over-the-counter medicines.  Any problems you or family members have had with anesthetic medicines.  Any blood disorders you have.  Any surgeries you have had.  Any medical conditions you have.  Any problems you have had passing stool. What are the risks? Generally, this is a safe procedure. However, problems may occur, including:  Bleeding.  A tear in the intestine.  A reaction to medicines given during the exam.  Infection (rare). What happens before the procedure? Eating and drinking restrictions  Follow instructions from your health care provider about eating and drinking, which may include:  A few days before the procedure - follow a low-fiber diet. Avoid nuts, seeds, dried fruit, raw fruits, and vegetables.  1-3 days before the procedure - follow a clear liquid diet. Drink only clear liquids, such as clear broth or bouillon, black coffee or tea, clear juice, clear soft drinks or sports drinks, gelatin desert, and popsicles. Avoid any liquids that contain red or purple dye.  On the day of the procedure - do not eat or drink anything during the 2 hours before the procedure, or within the time period that your health care provider recommends. Bowel prep  If you were prescribed an oral bowel prep to  clean out your colon:  Take it as told by your health care provider. Starting the day before your procedure, you will need to drink a large amount of medicated liquid. The liquid will cause you to have multiple loose stools until your stool is almost clear or light green.  If your skin or anus gets irritated from diarrhea, you may use these to relieve the irritation:  Medicated wipes, such as adult wet wipes with aloe and vitamin E.  A skin soothing-product like petroleum jelly.  If you vomit while drinking the bowel prep, take a break for up to 60 minutes and then begin the bowel prep again. If vomiting continues and you cannot take the bowel prep without vomiting, call your health care provider. General instructions  Ask your health care provider about changing or stopping your regular medicines. This is especially important if you are taking diabetes medicines or blood thinners.  Plan to have someone take you home from the hospital or clinic. What happens during the procedure?  An IV tube may be inserted into one of your veins.  You will be given medicine to help you relax (sedative).  To reduce your risk of infection:  Your health care team will wash or sanitize their hands.  Your anal area will be washed with soap.  You will be asked to lie on your side with your knees bent.  Your health care provider will lubricate a long, thin, flexible tube. The tube will have a camera and a light on the end.  The tube will be inserted into your anus.  The tube will be gently eased through your rectum and colon.  Air will be delivered into your colon to keep it open. You may feel some pressure or cramping.  The camera will be used to take images during the procedure.  A small tissue sample may be removed from your body to be examined under a microscope (biopsy). If any potential problems are found, the tissue will be sent to a lab for testing.  If small polyps are found, your health  care provider may remove them and have them checked for cancer cells.  The tube that was inserted into your anus will be slowly removed. The procedure may vary among health care providers and hospitals. What happens after the procedure?  Your blood pressure, heart rate, breathing rate, and blood oxygen level will be monitored until the medicines you were given have worn off.  Do not drive for 24 hours after the exam.  You may have a small amount of blood in your stool.  You may pass gas and have mild abdominal cramping or bloating due to the air that was used to inflate your colon during the exam.  It is up to you to get the results of your procedure. Ask your health care provider, or the department performing the procedure, when your results will be ready. This information is not intended to replace advice given to you by your health care provider. Make sure you discuss any questions you have with your health care provider. Document Released: 02/11/2000 Document Revised: 09/03/2015 Document Reviewed: 04/27/2015 Elsevier Interactive Patient Education  2017 [employer].    Esophagogastroduodenoscopy Introduction Esophagogastroduodenoscopy (EGD) is a procedure to examine the lining of the esophagus, stomach, and first part of the small intestine (duodenum). This procedure is done to check for problems such as inflammation, bleeding, ulcers, or growths. During this procedure, a long, flexible, lighted tube with a camera attached (endoscope) is inserted down the throat. Tell a health care provider about:  Any allergies you have.  All medicines you are taking, including vitamins, herbs, eye drops, creams, and over-the-counter medicines.  Any problems you or family members have had with anesthetic medicines.  Any blood disorders you have.  Any surgeries you have had.  Any medical conditions you have.  Whether you are pregnant or may be pregnant. What are the risks? Generally,  this is a safe procedure. However, problems may occur, including:  Infection.  Bleeding.  A tear (perforation) in the esophagus, stomach, or duodenum.  Trouble breathing.  Excessive sweating.  Spasms of the larynx.  A slowed heartbeat.  Low blood pressure. What happens before the procedure?  Follow instructions from your health care provider about eating or drinking restrictions.  Ask your health care provider about:  Changing or stopping your regular medicines. This is especially important if you are taking diabetes medicines or blood thinners.  Taking medicines such as aspirin and ibuprofen. These medicines can thin your blood. Do not take these medicines before your procedure if your health care provider instructs you not to.  Plan to have someone take you home after the procedure.  If you wear dentures, be ready to remove them before the procedure. What happens during the procedure?  To reduce your risk of infection, your health care team will wash or sanitize their hands.  An IV tube will be put in a vein in your hand or arm. You will get medicines and fluids through this tube.  You will be given one or more  of the following:  A medicine to help you relax (sedative).  A medicine to numb the area (local anesthetic). This medicine may be sprayed into your throat. It will make you feel more comfortable and keep you from gagging or coughing during the procedure.  A medicine for pain.  A mouth guard may be placed in your mouth to protect your teeth and to keep you from biting on the endoscope.  You will be asked to lie on your left side.  The endoscope will be lowered down your throat into your esophagus, stomach, and duodenum.  Air will be put into the endoscope. This will help your health care provider see better.  The lining of your esophagus, stomach, and duodenum will be examined.  Your health care provider may:  Take a tissue sample so it can be looked at  in a lab (biopsy).  Remove growths.  Remove objects (foreign bodies) that are stuck.  Treat any bleeding with medicines or other devices that stop tissue from bleeding.  Widen (dilate) or stretch narrowed areas of your esophagus and stomach.  The endoscope will be taken out. The procedure may vary among health care providers and hospitals. What happens after the procedure?  Your blood pressure, heart rate, breathing rate, and blood oxygen level will be monitored often until the medicines you were given have worn off.  Do not eat or drink anything until the numbing medicine has worn off and your gag reflex has returned. This information is not intended to replace advice given to you by your health care provider. Make sure you discuss any questions you have with your health care provider. Document Released: 06/16/2004 Document Revised: 07/22/2015 Document Reviewed: 01/07/2015  2017 Elsevier

## 2016-02-03 ENCOUNTER — Other Ambulatory Visit: Payer: Self-pay | Admitting: Family Medicine

## 2016-02-03 DIAGNOSIS — D649 Anemia, unspecified: Secondary | ICD-10-CM | POA: Insufficient documentation

## 2016-02-03 MED ORDER — DEXTROSE 5 % IV SOLN: 2.0000 g | Freq: Once | INTRAVENOUS | Status: DC

## 2016-02-03 NOTE — Telephone Encounter (Signed)
It was sent 11/2015. Please verify with pharmacy if they received prescription

## 2016-02-03 NOTE — Telephone Encounter (Signed)
Medication was on hold, but [facility] [address] just ran the medication and is filling it now. Please notify the patient for me. Thanks

## 2016-02-04 ENCOUNTER — Ambulatory Visit: Payer: Medicare Other | Admitting: Anesthesiology

## 2016-02-04 ENCOUNTER — Encounter
Admission: RE | Disposition: A | Discharge status: Home / Self Care | Payer: Self-pay | Source: Ambulatory Visit | Attending: General Surgery

## 2016-02-04 ENCOUNTER — Ambulatory Visit
Admission: RE | Admit: 2016-02-04 | Discharge: 2016-02-04 | Disposition: A | Discharge status: Home / Self Care | Payer: Medicare Other | Source: Ambulatory Visit | Attending: General Surgery | Admitting: General Surgery

## 2016-02-04 DIAGNOSIS — K635 Polyp of colon: Secondary | ICD-10-CM | POA: Diagnosis not present

## 2016-02-04 DIAGNOSIS — K5790 Diverticulosis of intestine, part unspecified, without perforation or abscess without bleeding: Secondary | ICD-10-CM | POA: Insufficient documentation

## 2016-02-04 DIAGNOSIS — I10 Essential (primary) hypertension: Secondary | ICD-10-CM | POA: Insufficient documentation

## 2016-02-04 DIAGNOSIS — D131 Benign neoplasm of stomach: Secondary | ICD-10-CM | POA: Diagnosis not present

## 2016-02-04 DIAGNOSIS — M797 Fibromyalgia: Secondary | ICD-10-CM | POA: Diagnosis not present

## 2016-02-04 DIAGNOSIS — D123 Benign neoplasm of transverse colon: Secondary | ICD-10-CM | POA: Insufficient documentation

## 2016-02-04 DIAGNOSIS — K2950 Unspecified chronic gastritis without bleeding: Secondary | ICD-10-CM | POA: Insufficient documentation

## 2016-02-04 DIAGNOSIS — M1990 Unspecified osteoarthritis, unspecified site: Secondary | ICD-10-CM | POA: Insufficient documentation

## 2016-02-04 DIAGNOSIS — D509 Iron deficiency anemia, unspecified: Secondary | ICD-10-CM | POA: Diagnosis not present

## 2016-02-04 DIAGNOSIS — D125 Benign neoplasm of sigmoid colon: Secondary | ICD-10-CM | POA: Insufficient documentation

## 2016-02-04 DIAGNOSIS — F329 Major depressive disorder, single episode, unspecified: Secondary | ICD-10-CM | POA: Diagnosis not present

## 2016-02-04 DIAGNOSIS — F419 Anxiety disorder, unspecified: Secondary | ICD-10-CM | POA: Insufficient documentation

## 2016-02-04 DIAGNOSIS — K219 Gastro-esophageal reflux disease without esophagitis: Secondary | ICD-10-CM | POA: Diagnosis not present

## 2016-02-04 DIAGNOSIS — K2970 Gastritis, unspecified, without bleeding: Secondary | ICD-10-CM | POA: Diagnosis not present

## 2016-02-04 DIAGNOSIS — K317 Polyp of stomach and duodenum: Secondary | ICD-10-CM | POA: Diagnosis not present

## 2016-02-04 DIAGNOSIS — D126 Benign neoplasm of colon, unspecified: Secondary | ICD-10-CM | POA: Diagnosis not present

## 2016-02-04 DIAGNOSIS — D649 Anemia, unspecified: Secondary | ICD-10-CM

## 2016-02-04 HISTORY — PX: ESOPHAGOGASTRODUODENOSCOPY (EGD) WITH PROPOFOL: SHX5813

## 2016-02-04 HISTORY — PX: COLONOSCOPY WITH PROPOFOL: SHX5780

## 2016-02-04 SURGERY — COLONOSCOPY WITH PROPOFOL
Anesthesia: General

## 2016-02-04 MED ORDER — CEFAZOLIN SODIUM-DEXTROSE 2-4 GM/100ML-% IV SOLN
2.0000 g | INTRAVENOUS | Status: AC
  Administered 2016-02-04: 2 g via INTRAVENOUS

## 2016-02-04 MED ORDER — PROPOFOL 10 MG/ML IV BOLUS
INTRAVENOUS | Status: DC | PRN
  Administered 2016-02-04 (×2): 50 mg via INTRAVENOUS

## 2016-02-04 MED ORDER — SODIUM CHLORIDE 0.9 % IV SOLN
INTRAVENOUS | Status: DC
  Administered 2016-02-04 (×2): via INTRAVENOUS

## 2016-02-04 MED ORDER — PROPOFOL 500 MG/50ML IV EMUL
INTRAVENOUS | Status: DC | PRN
  Administered 2016-02-04: 125 ug/kg/min via INTRAVENOUS

## 2016-02-04 NOTE — H&P (Signed)
No interval change in history or exam. For upper and lower endoscopy.

## 2016-02-04 NOTE — Op Note (Signed)
[facility] Patient Name: Jacqueline Lucas Procedure Date: 02/04/2016 11:14 AM MRN: [id number] Account #: [id number] Date of Birth: 07/14/1950 Admit Type: Outpatient Age: 65 Room: [facility] ENDO ROOM 1 Gender: Female Note Status: Finalized Procedure:            Upper GI endoscopy Indications:          Iron deficiency anemia Providers:            Robert Bellow, MD Referring MD:         Bethena Roys. Sowles, MD (Referring MD) Medicines:            Monitored Anesthesia Care Complications:        No immediate complications. Procedure:            Pre-Anesthesia Assessment:                       - The risks and benefits of the procedure and the                        sedation options and risks were discussed with the                        patient. All questions were answered and informed                        consent was obtained.                       After obtaining informed consent, the endoscope was                        passed under direct vision. Throughout the procedure,                        the patient's blood pressure, pulse, and oxygen                        saturations were monitored continuously. The Endoscope                        was introduced through the mouth, and advanced to the                        second part of duodenum. The upper GI endoscopy was                        accomplished without difficulty. The patient tolerated                        the procedure well. The patient tolerated the procedure                        well. Findings:      The esophagus was normal.      Diffuse mild inflammation characterized by erythema was found in the       prepyloric region of the stomach.      Multiple 5 to 12 mm semi-sessile polyps with no bleeding and no stigmata       of recent bleeding were found in the gastric body. The polyp was removed  with a hot snare. Resection and retrieval were complete.      The examined duodenum  was normal. Impression:           - Normal esophagus.                       - Chronic gastritis.                       - Multiple gastric polyps. Resected and retrieved.                       - Normal examined duodenum. Recommendation:       - Telephone endoscopist for pathology results in 1 week.                       - Perform a colonoscopy today. Procedure Code(s):    --- Professional ---                       [phone], Esophagogastroduodenoscopy, flexible, transoral;                        with removal of tumor(s), polyp(s), or other lesion(s)                        by snare technique Diagnosis Code(s):    --- Professional ---                       K29.50, Unspecified chronic gastritis without bleeding                       K31.7, Polyp of stomach and duodenum                       D50.9, Iron deficiency anemia, unspecified CPT copyright 2016 American Medical Association. All rights reserved. The codes documented in this report are preliminary and upon coder review may  be revised to meet current compliance requirements. Robert Bellow, MD 02/04/2016 11:39:56 AM This report has been signed electronically. Number of Addenda: 0 Note Initiated On: 02/04/2016 11:14 AM      [facility]

## 2016-02-04 NOTE — Transfer of Care (Signed)
Immediate Anesthesia Transfer of Care Note  Patient: Jacqueline Lucas  Procedure(s) Performed: Procedure(s): COLONOSCOPY WITH PROPOFOL (N/A) ESOPHAGOGASTRODUODENOSCOPY (EGD) WITH PROPOFOL (N/A)  Patient Location: PACU  Anesthesia Type:General  Level of Consciousness: awake, alert  and oriented  Airway & Oxygen Therapy: Patient Spontanous Breathing and Patient connected to nasal cannula oxygen  Post-op Assessment: Report given to RN and Post -op Vital signs reviewed and stable  Post vital signs: Reviewed and stable  Last Vitals:  Vitals:   02/04/16 0956  BP: (!) 154/67  Pulse: 76  Resp: 18  Temp: 36.5 C    Last Pain:  Vitals:   02/04/16 0956  TempSrc: Tympanic         Complications: No apparent anesthesia complications

## 2016-02-04 NOTE — Op Note (Signed)
[facility] Patient Name: Jacqueline Lucas Procedure Date: 02/04/2016 11:13 AM MRN: [id number] Account #: [id number] Date of Birth: 10/24/1950 Admit Type: Outpatient Age: 65 Room: [facility] ENDO ROOM 1 Gender: Female Note Status: Finalized Procedure:            Colonoscopy Indications:          Unexplained iron deficiency anemia Providers:            Robert Bellow, MD Referring MD:         Bethena Roys. Sowles, MD (Referring MD) Medicines:            Monitored Anesthesia Care Complications:        No immediate complications. Procedure:            Pre-Anesthesia Assessment:                       - Prior to the procedure, a History and Physical was                        performed, and patient medications, allergies and                        sensitivities were reviewed. The patient's tolerance of                        previous anesthesia was reviewed.                       - The risks and benefits of the procedure and the                        sedation options and risks were discussed with the                        patient. All questions were answered and informed                        consent was obtained.                       After obtaining informed consent, the colonoscope was                        passed under direct vision. Throughout the procedure,                        the patient's blood pressure, pulse, and oxygen                        saturations were monitored continuously. The                        Colonoscope was introduced through the anus and                        advanced to the the cecum, identified by appendiceal                        orifice and ileocecal valve. The colonoscopy was  somewhat difficult due to a tortuous colon. The patient                        tolerated the procedure well. The quality of the bowel                        preparation was excellent. Findings:      A 10 mm polyp was  found in the recto-sigmoid colon. The polyp was       sessile. The polyp was removed with a hot snare. Resection and retrieval       were complete.      The retroflexed view of the distal rectum and anal verge was normal and       showed no anal or rectal abnormalities. Impression:           - One 10 mm polyp at the recto-sigmoid colon, removed                        with a hot snare. Resected and retrieved.                       - The distal rectum and anal verge are normal on                        retroflexion view. Recommendation:       - Telephone endoscopist for pathology results in 1 week. Procedure Code(s):    --- Professional ---                       [phone], Colonoscopy, flexible; with removal of tumor(s),                        polyp(s), or other lesion(s) by snare technique Diagnosis Code(s):    --- Professional ---                       D12.7, Benign neoplasm of rectosigmoid junction                       D50.9, Iron deficiency anemia, unspecified CPT copyright 2016 [employer]. All rights reserved. The codes documented in this report are preliminary and upon coder review may  be revised to meet current compliance requirements. Robert Bellow, MD 02/04/2016 12:22:52 PM This report has been signed electronically. Number of Addenda: 0 Note Initiated On: 02/04/2016 11:13 AM Scope Withdrawal Time: 0 hours 16 minutes 4 seconds  Total Procedure Duration: 0 hours 37 minutes 47 seconds       [facility]

## 2016-02-04 NOTE — Anesthesia Preprocedure Evaluation (Signed)
Anesthesia Evaluation  Patient identified by MRN, date of birth, ID band Patient awake    Reviewed: Allergy & Precautions, NPO status , Patient's Chart, lab work & pertinent test results  History of Anesthesia Complications (+) PONV and history of anesthetic complications  Airway Mallampati: III  TM Distance: >3 FB Neck ROM: Full    Dental  (+) Partial Lower   Pulmonary asthma (mild intermittent, uses rescue inhaler ~1x per week) , neg sleep apnea,    breath sounds clear to auscultation- rhonchi (-) wheezing      Cardiovascular Exercise Tolerance: Good hypertension, (-) CAD, (-) Past MI and (-) Cardiac Stents  Rhythm:Regular Rate:Normal - Systolic murmurs and - Diastolic murmurs    Neuro/Psych  Headaches, Anxiety Depression    GI/Hepatic Neg liver ROS, GERD  ,  Endo/Other  neg diabetesHypothyroidism   Renal/GU negative Renal ROS     Musculoskeletal  (+) Arthritis , Fibromyalgia -  Abdominal (+) + obese,   Peds  Hematology  (+) anemia ,   Anesthesia Other Findings Past Medical History: No date: Allergy No date: Allergy to adhesive tape     Comment: Allergy to paper tape as well No date: Alzheimer disease No date: Anemia No date: Anginal pain ([place]) No date: Anxiety No date: Arthritis No date: Asthma No date: Bronchitis No date: Cataract No date: Chronic nausea No date: Claustrophobia No date: DDD (degenerative disc disease), lumbar No date: Depression No date: Dry eyes     Comment: uses Restasis No date: Fibromyalgia No date: GERD (gastroesophageal reflux disease) No date: H/O bladder infections No date: Heart murmur No date: Hematuria No date: Hyperlipidemia No date: Hypertension No date: Hypothyroidism No date: IBS (irritable bowel syndrome) No date: Insomnia No date: Lumbar neuritis No date: Migraines No date: Obesity No date: Osteoporosis No date: Palpitation No date: PONV (postoperative  nausea and vomiting) No date: RLS (restless legs syndrome) No date: Shoulder fracture, right No date: Sicca ([place]) No date: Tachycardia No date: Thyroid disease   Reproductive/Obstetrics                             Anesthesia Physical Anesthesia Plan  ASA: III  Anesthesia Plan: General   Post-op Pain Management:    Induction: Intravenous  Airway Management Planned: Natural Airway  Additional Equipment:   Intra-op Plan:   Post-operative Plan:   Informed Consent: I have reviewed the patients History and Physical, chart, labs and discussed the procedure including the risks, benefits and alternatives for the proposed anesthesia with the patient or authorized representative who has indicated his/her understanding and acceptance.   Dental advisory given  Plan Discussed with: CRNA and Anesthesiologist  Anesthesia Plan Comments:         Anesthesia Quick Evaluation

## 2016-02-04 NOTE — Anesthesia Postprocedure Evaluation (Signed)
Anesthesia Post Note  Patient: CHA LOCKETTE  Procedure(s) Performed: Procedure(s) (LRB): COLONOSCOPY WITH PROPOFOL (N/A) ESOPHAGOGASTRODUODENOSCOPY (EGD) WITH PROPOFOL (N/A)  Patient location during evaluation: Endoscopy Anesthesia Type: General Level of consciousness: awake and alert and oriented Pain management: pain level controlled Vital Signs Assessment: post-procedure vital signs reviewed and stable Respiratory status: spontaneous breathing, nonlabored ventilation and respiratory function stable Cardiovascular status: blood pressure returned to baseline and stable Postop Assessment: no signs of nausea or vomiting Anesthetic complications: no    Last Vitals:  Vitals:   02/04/16 1242 02/04/16 1252  BP: 119/77 (!) 161/92  Pulse: 72 74  Resp: 13 14  Temp:      Last Pain:  Vitals:   02/04/16 1232  TempSrc: Tympanic                 Perina Salvaggio

## 2016-02-04 NOTE — Anesthesia Procedure Notes (Signed)
Date/Time: 02/04/2016 11:30 AM Performed by: Nelda Marseille Pre-anesthesia Checklist: Patient identified, Emergency Drugs available, Suction available, Patient being monitored and Timeout performed Oxygen Delivery Method: Nasal cannula

## 2016-02-05 ENCOUNTER — Ambulatory Visit: Payer: Medicare Other

## 2016-02-07 ENCOUNTER — Encounter: Payer: Self-pay | Admitting: General Surgery

## 2016-02-07 ENCOUNTER — Ambulatory Visit: Payer: Medicare Other | Admitting: Urology

## 2016-02-07 ENCOUNTER — Telehealth: Payer: Self-pay

## 2016-02-07 LAB — SURGICAL PATHOLOGY

## 2016-02-07 NOTE — Telephone Encounter (Signed)
Notified patient as instructed, patient pleased. Discussed follow-up appointments, patient agrees  

## 2016-02-07 NOTE — Telephone Encounter (Signed)
-----   Message from Robert Bellow, MD sent at 02/07/2016 12:33 PM EST ----- Please notify the patient all biopsy results were fine. We'll be in touch regarding any additional testing.

## 2016-02-10 DIAGNOSIS — M461 Sacroiliitis, not elsewhere classified: Secondary | ICD-10-CM | POA: Diagnosis not present

## 2016-02-15 DIAGNOSIS — R55 Syncope and collapse: Secondary | ICD-10-CM | POA: Diagnosis not present

## 2016-02-15 DIAGNOSIS — I471 Supraventricular tachycardia: Secondary | ICD-10-CM | POA: Diagnosis not present

## 2016-02-16 ENCOUNTER — Telehealth: Payer: Self-pay

## 2016-02-16 NOTE — Telephone Encounter (Signed)
-----   Message from Robert Bellow, MD sent at 02/14/2016 12:39 PM EST ----- I would like the patient to complete a set of 6 stool Hemoccult slides to confirm there is no bleeding from the small intestines. Acute

## 2016-02-17 NOTE — Telephone Encounter (Signed)
Patient will come by to pick up stool cards. Instructions reviewed with the patient.

## 2016-02-18 DIAGNOSIS — R42 Dizziness and giddiness: Secondary | ICD-10-CM | POA: Diagnosis not present

## 2016-03-03 ENCOUNTER — Ambulatory Visit: Payer: Medicare Other

## 2016-03-06 ENCOUNTER — Telehealth: Payer: Self-pay | Admitting: *Deleted

## 2016-03-06 NOTE — Telephone Encounter (Signed)
Patient has not picked up stool cards yet. She states she will pick up cards sometime this week. She is aware of instructions.

## 2016-03-06 NOTE — Telephone Encounter (Signed)
-----   Message from Robert Bellow, MD sent at 03/02/2016  8:45 AM EST ----- Confirm patient has picked up stool hemacult cards. Thanks.

## 2016-03-14 ENCOUNTER — Other Ambulatory Visit: Payer: Self-pay | Admitting: Family Medicine

## 2016-03-14 DIAGNOSIS — F331 Major depressive disorder, recurrent, moderate: Secondary | ICD-10-CM

## 2016-03-14 DIAGNOSIS — F411 Generalized anxiety disorder: Secondary | ICD-10-CM

## 2016-03-14 MED ORDER — VENLAFAXINE HCL ER 150 MG PO CP24
150.0000 mg | ORAL_CAPSULE | Freq: Every day | ORAL | 0 refills | Status: DC
Start: 2016-03-14 — End: 2016-06-21

## 2016-03-14 NOTE — Telephone Encounter (Signed)
Pt needs refill on Venlafaxine 90 day supply, to be sent to [facility].

## 2016-03-15 ENCOUNTER — Other Ambulatory Visit (INDEPENDENT_AMBULATORY_CARE_PROVIDER_SITE_OTHER): Payer: Self-pay | Admitting: Orthopedic Surgery

## 2016-03-17 NOTE — Telephone Encounter (Signed)
Rx request 

## 2016-03-31 ENCOUNTER — Other Ambulatory Visit: Payer: Self-pay | Admitting: Rheumatology

## 2016-03-31 ENCOUNTER — Ambulatory Visit (INDEPENDENT_AMBULATORY_CARE_PROVIDER_SITE_OTHER): Payer: Medicare Other | Admitting: *Deleted

## 2016-03-31 DIAGNOSIS — D649 Anemia, unspecified: Secondary | ICD-10-CM | POA: Diagnosis not present

## 2016-03-31 DIAGNOSIS — R42 Dizziness and giddiness: Secondary | ICD-10-CM

## 2016-03-31 DIAGNOSIS — Z1239 Encounter for other screening for malignant neoplasm of breast: Secondary | ICD-10-CM

## 2016-03-31 LAB — POC HEMOCCULT BLD/STL (HOME/3-CARD/SCREEN)
Card #2 Fecal Occult Blod, POC: POSITIVE
Card #2 Fecal Occult Blod, POC: POSITIVE
Card #3 Fecal Occult Blood, POC: POSITIVE
Card #3 Fecal Occult Blood, POC: POSITIVE
Fecal Occult Blood, POC: POSITIVE — AB
Fecal Occult Blood, POC: POSITIVE — AB

## 2016-03-31 NOTE — Telephone Encounter (Signed)
Last Visit: 01/13/16 Next Visit: 07/11/16  Okay to refill Baclofen?

## 2016-03-31 NOTE — Progress Notes (Signed)
Patient brought in Hemoccults cards today, all was positive.

## 2016-04-05 ENCOUNTER — Other Ambulatory Visit: Payer: Self-pay | Admitting: Family Medicine

## 2016-04-05 ENCOUNTER — Telehealth: Payer: Self-pay | Admitting: Family Medicine

## 2016-04-05 ENCOUNTER — Other Ambulatory Visit: Payer: Self-pay | Admitting: General Surgery

## 2016-04-05 ENCOUNTER — Other Ambulatory Visit: Payer: Self-pay | Admitting: *Deleted

## 2016-04-05 ENCOUNTER — Telehealth: Payer: Self-pay | Admitting: *Deleted

## 2016-04-05 MED ORDER — POLYETHYLENE GLYCOL 3350 17 GM/SCOOP PO POWD: ORAL | 0 refills | Status: DC

## 2016-04-05 MED ORDER — OSELTAMIVIR PHOSPHATE 75 MG PO CAPS: 75.0000 mg | ORAL_CAPSULE | Freq: Every day | ORAL | 0 refills | Status: DC

## 2016-04-05 NOTE — Telephone Encounter (Signed)
Okay to proceed as scheduled per Dr. Bary Castilla.

## 2016-04-05 NOTE — Telephone Encounter (Signed)
-----   Message from Robert Bellow, MD sent at 04/05/2016  7:34 AM EST ----- Please schedule for a capsule endoscopy, standard colonoscopy prep day before. Dr. Tiffany Kocher to read. Had told her she would be having a GI study first. Dr. Tiffany Kocher said this is not necessary. ----- Message ----- From: Chrystie Nose, CMA Sent: 03/31/2016  12:44 PM To: Robert Bellow, MD

## 2016-04-05 NOTE — Telephone Encounter (Signed)
Patient has been scheduled for a capsule endoscopy at [facility] for Tuesday, 04-11-16. This patient was instructed to call the day before between 1 and 3 pm to get her arrival time.   Miralax prep was sent in to her pharmacy today.   This patient also states she has had bronchitis for the last 4 weeks. She is taking Mucinex twice daily.   Message to Dr. Bary Castilla to make sure we can proceed as scheduled.   Trish in Endoscopy is aware that we are requesting Dr. Tiffany Kocher to read study.

## 2016-04-05 NOTE — Telephone Encounter (Signed)
Patient stated that her and Fritz Pickerel (husband) keep their granddaughter during the day and she has now tested positive for the flu.  Patient would like to know if Tamflu can be sent to the [facility] pharmacy on [address].

## 2016-04-06 ENCOUNTER — Telehealth: Payer: Self-pay | Admitting: Family Medicine

## 2016-04-06 NOTE — Telephone Encounter (Signed)
Requesting return call to discuss valsartan prescription. States he picked up the prescription yesterday and thinks the dosage was wrong. Says that Kerisha was on 320mg  about a yr ago then it was switched to 160mg  but last night when he picked it up it was back at 320mg .

## 2016-04-06 NOTE — Telephone Encounter (Signed)
Spoke to [place] at [place] and she stated that they could bring the medication back and get a refund. She stated that it was on auto fill but I instructed her that the dosage had changed. Called pt to inform them and let them know that refills will be ordered during their next visit

## 2016-04-10 ENCOUNTER — Telehealth: Payer: Self-pay | Admitting: *Deleted

## 2016-04-10 NOTE — Telephone Encounter (Signed)
Patient contacted the office today to notify us that she has the flu.   This patient was scheduled for a capsule endoscopy for tomorrow at [facility]. We will cancel until patient is feeling better.   Trish in endoscopy has been notified of cancellation.   Patient instructed to call the office once she is feeling better to get this reschedule. She verbalizes understanding.

## 2016-04-11 ENCOUNTER — Encounter: Admission: RE | Payer: Self-pay | Source: Ambulatory Visit

## 2016-04-11 ENCOUNTER — Ambulatory Visit: Admission: RE | Admit: 2016-04-11 | Payer: Medicare Other | Source: Ambulatory Visit | Admitting: General Surgery

## 2016-04-11 SURGERY — IMAGING PROCEDURE, GI TRACT, INTRALUMINAL, VIA CAPSULE

## 2016-04-14 ENCOUNTER — Ambulatory Visit (INDEPENDENT_AMBULATORY_CARE_PROVIDER_SITE_OTHER): Payer: Medicare Other | Admitting: Family Medicine

## 2016-04-14 ENCOUNTER — Encounter: Payer: Self-pay | Admitting: Family Medicine

## 2016-04-14 VITALS — BP 138/78 | HR 74 | Temp 98.2°F | Resp 16 | Wt 209.6 lb

## 2016-04-14 DIAGNOSIS — F3160 Bipolar disorder, current episode mixed, unspecified: Secondary | ICD-10-CM | POA: Diagnosis not present

## 2016-04-14 DIAGNOSIS — F331 Major depressive disorder, recurrent, moderate: Secondary | ICD-10-CM | POA: Diagnosis not present

## 2016-04-14 DIAGNOSIS — J42 Unspecified chronic bronchitis: Secondary | ICD-10-CM | POA: Insufficient documentation

## 2016-04-14 DIAGNOSIS — E785 Hyperlipidemia, unspecified: Secondary | ICD-10-CM

## 2016-04-14 DIAGNOSIS — I209 Angina pectoris, unspecified: Secondary | ICD-10-CM | POA: Diagnosis not present

## 2016-04-14 DIAGNOSIS — I10 Essential (primary) hypertension: Secondary | ICD-10-CM | POA: Diagnosis not present

## 2016-04-14 DIAGNOSIS — G43109 Migraine with aura, not intractable, without status migrainosus: Secondary | ICD-10-CM | POA: Diagnosis not present

## 2016-04-14 DIAGNOSIS — E559 Vitamin D deficiency, unspecified: Secondary | ICD-10-CM | POA: Diagnosis not present

## 2016-04-14 DIAGNOSIS — J410 Simple chronic bronchitis: Secondary | ICD-10-CM

## 2016-04-14 MED ORDER — DIVALPROEX SODIUM 250 MG PO DR TAB: 250.0000 mg | DELAYED_RELEASE_TABLET | Freq: Two times a day (BID) | ORAL | 0 refills | Status: DC

## 2016-04-14 MED ORDER — ONDANSETRON HCL 4 MG PO TABS: 4.0000 mg | ORAL_TABLET | Freq: Three times a day (TID) | ORAL | 0 refills | Status: DC | PRN

## 2016-04-14 MED ORDER — ATORVASTATIN CALCIUM 40 MG PO TABS: 40.0000 mg | ORAL_TABLET | Freq: Every day | ORAL | 1 refills | Status: DC

## 2016-04-14 MED ORDER — MOMETASONE FURO-FORMOTEROL FUM 200-5 MCG/ACT IN AERO: 2.0000 | INHALATION_SPRAY | Freq: Two times a day (BID) | RESPIRATORY_TRACT | 0 refills | Status: DC

## 2016-04-14 NOTE — Patient Instructions (Addendum)
Please verify if you are taking Flexeril (cyclobenzaprine ) or Robaxin ( methocarbamol ) same class of drugs, should not take both. She is also on Baclofen . You need to take only one type   We stopped Tricor - fenofibrate  Adding Depakote twice daily for mood and also migraine prevention

## 2016-04-14 NOTE — Progress Notes (Signed)
Name: Jacqueline Lucas   MRN: [id number]    DOB: 1950/11/03   Date:04/14/2016       Progress Note  Subjective  Chief Complaint  Chief Complaint  Patient presents with  . Medication Refill    3 month F/U  . Hypertension    Headaches that has lasted for the past 4 days  . Manic Behavior    Still having trouble sleeping averages 4-5 hours night. Was very lethargic and slow reactions when taking Abilify stop taking it about 3 weeks ago.   . Back Pain    Onset-2 days ago, has had 5 back surgeries previously. But the past 2 days her back pain has been constantly goes from middle of her back to the lower end,  . Dizziness    Unchanged  . Hot Flashes    Still having them    HPI   HTN: she is taking Diovan 160/12.5 and is doing well, no chest pain or palpitation.  She states no recent episodes of chest pain, but will contact Dr. Clayborn Bigness and schedule the tests that he recommended  Dizziness: she feels light headed since last last Fall. She states she feels tired, no tinnitus, hearing loss or migraine associated with symptoms, no weakness. She has seen ENT and had negative evaluation. She stopped seeing headache clinic. She states that she felt better while taking ferrous sulfate, but stopped for procedure and is getting a little dizzy again. She has iron deficiency anemia. Had EGD and showed chronic gastritis, colonoscopy only one polyp. Positive hemoccult times 3 and will have pill camera procedure ( ordered by Dr. Fleet Contras )  Bipolar disorder: she always had problems sleeping at night, but it has been worse since she ran out of Ability. She states that Abilify seems to help with mania, but she was feeling very down with severe anhedonia, but no crying so she stopped taking Abilify about 4 weeks. She is down to 4 hours per night and sometimes she can't sleep at all during the night. She has history of mania, we will try Depakote  Migraine headaches: used to go to the headache clinic, but  states she was taking too many medications. She is currently having a migraine, described dull pain on left frontal area for the past 4 days. She states sometimes she has associated nausea and vomiting. She has not been taking anything for acute episodes unless severe symptoms. She does not want to follow up with headache clinic  FMS: seeing Dr. Olegario Messier, looking though her medication list she has 3 active muscle relaxers. She will go home and look through to make sure it is correct and call rheumatologist to verify which medication she needs to stay on.   Dyslipidemia: currently on lipitor and Tricor, she has FMS and muscle pain, so we will stop Tricor and recheck labs on her next visit    Anemia: she is getting evaluated by Dr. Fleet Contras.   COPD mild: from second hand smoking, taking Dulera. She has occasional cough and wheezing, but doing well at this time  Patient Active Problem List   Diagnosis Date Noted  . Anemia 02/03/2016  . Status post bilateral total hip replacement 01/10/2016  . Sicca ([place]) 01/10/2016  . Age related osteoporosis 01/10/2016  . Hip arthritis 08/16/2015  . Microscopic hematuria 07/30/2015  . Vaginal atrophy 07/30/2015  . GAD (generalized anxiety disorder) 07/08/2015  . Moderate episode of recurrent major depressive disorder ([place]) 07/08/2015  . Migraine with aura and without status  migrainosus, not intractable 07/08/2015  . Dyslipidemia 07/08/2015  . Degenerative arthritis of right knee 03/16/2015  . Atrophic vaginitis 01/31/2015  . Recurrent UTI 12/31/2014  . IBS (irritable bowel syndrome) 10/29/2014  . Asthma, moderate persistent 09/08/2014  . Allergic state 09/07/2014  . Colon polyp 09/07/2014  . Hemorrhoid 09/07/2014  . Constrictive tenosynovitis 03/12/2014  . H/O total knee replacement, bilateral 07/31/2013  . Angina pectoris ([place]) 11/28/2012  . Anxiety 11/28/2012  . Acid reflux 11/28/2012  . Cardiac murmur 11/28/2012  . BP (high blood pressure)  11/28/2012  . Cannot sleep 11/28/2012  . Adiposity 11/28/2012  . Arthritis, degenerative 11/28/2012  . Restless leg 11/28/2012  . Supraventricular tachycardia ([place]) 11/28/2012  . Fibromyalgia 11/28/2012    Past Surgical History:  Procedure Laterality Date  . ABDOMINAL HYSTERECTOMY     due to cancer of ovaries  . BACK SURGERY N/A 2014   x 5  . CARDIAC CATHETERIZATION     no PCI; 12/18/12 ([facility], Dr. Marcello Moores): EP study with ablation. No coronary angiography done then.  . COLONOSCOPY W/ POLYPECTOMY    . COLONOSCOPY WITH PROPOFOL N/A 02/04/2016   Procedure: COLONOSCOPY WITH PROPOFOL;  Surgeon: Robert Bellow, MD;  Location: [facility] ENDOSCOPY;  Service: Endoscopy;  Laterality: N/A;  . ESOPHAGOGASTRODUODENOSCOPY (EGD) WITH PROPOFOL N/A 02/04/2016   Procedure: ESOPHAGOGASTRODUODENOSCOPY (EGD) WITH PROPOFOL;  Surgeon: Robert Bellow, MD;  Location: [facility] ENDOSCOPY;  Service: Endoscopy;  Laterality: N/A;  . HEMORRHOID SURGERY    . JOINT REPLACEMENT Bilateral    knee  . REPLACEMENT TOTAL KNEE Left   . SPINE SURGERY     x 2  . tendonititis     right elbow, right wrist  . TOTAL HIP ARTHROPLASTY Left 2013  . TOTAL HIP ARTHROPLASTY Right 08/16/2015   Procedure: TOTAL HIP ARTHROPLASTY ANTERIOR APPROACH;  Surgeon: Meredith Pel, MD;  Location: [place];  Service: Orthopedics;  Laterality: Right;  . TOTAL KNEE ARTHROPLASTY Right 03/16/2015   Procedure: RIGHT TOTAL KNEE ARTHROPLASTY, PCL SACRIFICING;  Surgeon: Meredith Pel, MD;  Location: [place];  Service: Orthopedics;  Laterality: Right;    Family History  Problem Relation Age of Onset  . Diabetes Mother   . Hypertension Mother   . Heart disease Mother   . Diabetes Father   . Cancer Father   . Hypertension Sister   . Depression Sister   . Cancer Brother   . Bladder Cancer Neg Hx   . Kidney disease Neg Hx     Social History   Social History  . Marital status: Married    Spouse name: N/A  . Number of children: N/A  . Years of  education: N/A   Occupational History  . Not on file.   Social History Main Topics  . Smoking status: Never Smoker  . Smokeless tobacco: Never Used  . Alcohol use No  . Drug use: No  . Sexual activity: No   Other Topics Concern  . Not on file   Social History Narrative  . No narrative on file     Current Outpatient Prescriptions:  .  aspirin 81 MG chewable tablet, Chew 81 mg by mouth daily., Disp: , Rfl:  .  atorvastatin (LIPITOR) 40 MG tablet, Take 1 tablet (40 mg total) by mouth at bedtime., Disp: 90 tablet, Rfl: 1 .  baclofen (LIORESAL) 10 MG tablet, TAKE ONE TABLET AT 7AM AND ONE TABLET AT 2PM AS NEEDED FOR MUSCLE SPASM, Disp: 180 tablet, Rfl: 1 .  Cholecalciferol (D3-1000) 1000  units tablet, Take 1,000 Units by mouth at bedtime. , Disp: , Rfl:  .  cyclobenzaprine (FLEXERIL) 10 MG tablet, Take 10 mg by mouth at bedtime., Disp: , Rfl:  .  cycloSPORINE (RESTASIS) 0.05 % ophthalmic emulsion, Place 1 drop into both eyes 2 (two) times daily. , Disp: , Rfl:  .  diclofenac (VOLTAREN) 75 MG EC tablet, TAKE 1 TABLET BY MOUTH TWICE A DAY WITH MEALS FOR 7 DAYS THEN DAILY AS NEEDED FOR PAIN, Disp: 60 tablet, Rfl: 1 .  diclofenac sodium (VOLTAREN) 1 % GEL, Apply 3 g topically 3 (three) times daily as needed., Disp: , Rfl:  .  DULERA 200-5 MCG/ACT AERO, INHALE 2 PUFFS INTO THE LUNGS 2 (TWO) TIMES DAILY. REPORTED ON 07/28/2015, Disp: 1 Inhaler, Rfl: 0 .  ESTRACE VAGINAL 0.1 MG/GM vaginal cream, , Disp: , Rfl:  .  ferrous sulfate 325 (65 FE) MG EC tablet, Take 325 mg by mouth 3 (three) times daily with meals., Disp: , Rfl:  .  gabapentin (NEURONTIN) 300 MG capsule, Take 3 capsules (900 mg total) by mouth 3 (three) times daily., Disp: 810 capsule, Rfl: 0 .  lansoprazole (PREVACID) 30 MG capsule, Take 1 capsule (30 mg total) by mouth 2 (two) times daily before a meal., Disp: 180 capsule, Rfl: 3 .  levalbuterol (XOPENEX) 1.25 MG/3ML nebulizer solution, Take 1.25 mg by nebulization every 4 (four)  hours as needed for wheezing., Disp: 72 mL, Rfl: 4 .  levothyroxine (SYNTHROID) 88 MCG tablet, Take 88 mcg by mouth daily before breakfast. , Disp: , Rfl:  .  magnesium oxide (MAG-OX) 400 MG tablet, Take 400 mg by mouth at bedtime. Reported on 07/28/2015, Disp: , Rfl:  .  methocarbamol (ROBAXIN) 500 MG tablet, Take 500 mg by mouth every 8 (eight) hours as needed. , Disp: , Rfl:  .  metoprolol tartrate (LOPRESSOR) 25 MG tablet, Take 25 mg by mouth 2 (two) times daily., Disp: , Rfl:  .  montelukast (SINGULAIR) 10 MG tablet, Take 1 tablet (10 mg total) by mouth at bedtime., Disp: 90 tablet, Rfl: 1 .  nystatin (MYCOSTATIN) 100000 UNIT/ML suspension, Take 5 mLs (500,000 Units total) by mouth 4 (four) times daily., Disp: 60 mL, Rfl: 0 .  ondansetron (ZOFRAN) 4 MG tablet, Take 1 tablet (4 mg total) by mouth every 8 (eight) hours as needed for nausea or vomiting., Disp: 20 tablet, Rfl: 0 .  oxycodone (OXY-IR) 5 MG capsule, Take 1 capsule (5 mg total) by mouth every 4 (four) hours as needed., Disp: 90 capsule, Rfl: 0 .  polyethylene glycol powder (GLYCOLAX/MIRALAX) powder, 255 grams one bottle for capsule endoscopy prep, Disp: 255 g, Rfl: 0 .  Tetrahydrozoline HCl (EYE DROPS OP), Apply to eye., Disp: , Rfl:  .  valsartan-hydrochlorothiazide (DIOVAN-HCT) 160-12.5 MG tablet, Take 1 tablet by mouth daily., Disp: 90 tablet, Rfl: 0 .  venlafaxine XR (EFFEXOR-XR) 150 MG 24 hr capsule, Take 1 capsule (150 mg total) by mouth daily with breakfast., Disp: 90 capsule, Rfl: 0 .  vitamin B-12 (CYANOCOBALAMIN) 1000 MCG tablet, Take 1,000 mcg by mouth at bedtime., Disp: , Rfl:   Current Facility-Administered Medications:  .  ceFAZolin (ANCEF) 2 g in dextrose 5 % 100 mL IVPB, 2 g, Intravenous, Once, Robert Bellow, MD  Allergies  Allergen Reactions  . Morphine Nausea And Vomiting    Ok with nausea medication  . Diazepam Nausea And Vomiting  . Lyrica [Pregabalin] Other (See Comments)    Joint swelling  . Latex Rash   .  Rash Away  [Petrolatum-Zinc Oxide] Rash and Swelling  . Salicylates Other (See Comments)    Other reaction(s): Headache  . Tape Rash    Please use paper tape     ROS  Constitutional: Negative for fever, positive for weight change.  Respiratory: Positive  For intermittent  cough and shortness of breath.   Cardiovascular: Negative for chest pain or palpitations.  Gastrointestinal: Negative for abdominal pain, no bowel changes.  Musculoskeletal: Negative for gait problem or joint swelling.  Skin: Negative for rash.  Neurological: Positive  for dizziness or headache.  No other specific complaints in a complete review of systems (except as listed in HPI above).  Objective  Vitals:   04/14/16 1332  BP: 138/78  Pulse: 74  Resp: 16  Temp: 98.2 F (36.8 C)  TempSrc: Oral  SpO2: 96%  Weight: 209 lb 9.6 oz (95.1 kg)    Body mass index is 35.98 kg/m.  Physical Exam  Constitutional: Patient appears well-developed and well-nourished. Obese No distress.  HEENT: head atraumatic, normocephalic, pupils equal and reactive to light, ears normal TM bilaterally, neck supple, throat within normal limits, no nystagmus Cardiovascular: Normal rate, she has some extra beats  normal heart sounds.  No murmur heard. No BLE edema. Pulmonary/Chest: Effort normal and breath sounds normal. No respiratory distress. Abdominal: Soft.  There is no tenderness. Psychiatric: Patient has a normal mood and affect. behavior is normal. Judgment and thought content normal. Neurologist:she came in without a walker today, balance has improved Muscular Skeletal: trigger point positive  Recent Results (from the past 2160 hour(s))  Surgical pathology     Status: None   Collection Time: 02/04/16 11:43 AM  Result Value Ref Range   SURGICAL PATHOLOGY      Surgical Pathology CASE: [id number] PATIENT: Freddrick March Surgical Pathology Report     SPECIMEN SUBMITTED: A. Gastric polyp;hot snare B. Colon  polyp 18cm; hot snare  CLINICAL HISTORY: None provided  PRE-OPERATIVE DIAGNOSIS: Anemia  POST-OPERATIVE DIAGNOSIS: gastric polyps, gastritis, diverticulosis, colon polyp     DIAGNOSIS: A. GASTRIC POLYP; HOT SNARE: - FUNDIC GLAND POLYPS. - NEGATIVE FOR H. PYLORI, DYSPLASIA, AND MALIGNANCY.  B. COLON POLYP, AT 18 CM; HOT SNARE: - HYPERPLASTIC POLYP. - NEGATIVE FOR DYSPLASIA AND MALIGNANCY.   GROSS DESCRIPTION:  A. Labeled: gastric polyp hot snare  Tissue fragment(s): multiple  Size: aggregate, 0.8 x 0.5 x 0.2 friable tan fragments and a 0.6 cm red polypoid fragment  Description: polypoid fragment inked blue at the base and bisected  Entirely submitted in one cassette(s).   B. Labeled: colon polyp at 18 cm polyp snare  Tissue fragment(s): 1  Size: 0.5 cm  Description: pink polypoid fragm ent, inked blue  Entirely submitted in 1 cassette(s).    Final Diagnosis performed by Quay Burow, MD.  Electronically signed 02/07/2016 10:38:29AM    The electronic signature indicates that the named Attending Pathologist has evaluated the specimen  Technical component performed at [facility], [address] [place], [place] [address] Lab: [phone] Dir: Darrick Penna. Evette Doffing, MD  Professional component performed at [facility], [facility], [place], [place], [place] [address] Lab: [phone] Dir: Dellia Nims. Rubinas, MD    POC Hemoccult Bld/Stl (3-Cd Home Screen)     Status: Abnormal   Collection Time: 03/31/16 12:43 PM  Result Value Ref Range   Card #1 Date     Fecal Occult Blood, POC Positive (A) Negative   Card #2 Date     Card #2 Fecal Occult Blod,  POC Positive    Card #3 Date     Card #3 Fecal Occult Blood, POC Positive   POC Hemoccult Bld/Stl (3-Cd Home Screen)     Status: Abnormal   Collection Time: 03/31/16 12:44 PM  Result Value Ref Range   Card #1 Date     Fecal Occult Blood, POC Positive (A) Negative   Card #2 Date     Card  #2 Fecal Occult Blod, POC Positive    Card #3 Date     Card #3 Fecal Occult Blood, POC Positive      PHQ2/9: Depression screen [facility] 2/9 04/14/2016 01/11/2016 12/10/2015 07/08/2015 12/22/2014  Decreased Interest 0 0 0 0 0  Down, Depressed, Hopeless 0 0 0 0 0  PHQ - 2 Score 0 0 0 0 0     Fall Risk: Fall Risk  04/14/2016 01/11/2016 12/10/2015 07/08/2015 12/22/2014  Falls in the past year? Yes Yes No No No  Number falls in past yr: 1 1 - - -  Injury with Fall? Yes Yes - - -  Risk Factor Category  - High Fall Risk - - -  Risk for fall due to : - - - - -  Follow up - Falls evaluation completed - - -     Functional Status Survey: Is the patient deaf or have difficulty hearing?: No Does the patient have difficulty seeing, even when wearing glasses/contacts?: No Does the patient have difficulty concentrating, remembering, or making decisions?: No Does the patient have difficulty walking or climbing stairs?: No Does the patient have difficulty dressing or bathing?: No Does the patient have difficulty doing errands alone such as visiting a doctor's office or shopping?: Yes (Does not drive)   Assessment & Plan  1. Essential hypertension  Well controlled  2. Vitamin D deficiency  Continue supplementation  3. Migraine with aura and without status migrainosus, not intractable  Depakote may also help with symptoms - ondansetron (ZOFRAN) 4 MG tablet; Take 1 tablet (4 mg total) by mouth every 8 (eight) hours as needed for nausea or vomiting.  Dispense: 20 tablet; Refill: 0  4. Angina pectoris (HCC)  Sees Dr. Clayborn Bigness   5. Moderate episode of recurrent major depressive disorder ([place])  - Ambulatory referral to Psychiatry  6. Bipolar 1 disorder, mixed (HCC)  - divalproex (DEPAKOTE) 250 MG DR tablet; Take 1 tablet (250 mg total) by mouth 2 (two) times daily.  Dispense: 180 tablet; Refill: 0 - Ambulatory referral to Psychiatry  7. Dyslipidemia  - atorvastatin (LIPITOR) 40 MG  tablet; Take 1 tablet (40 mg total) by mouth at bedtime.  Dispense: 90 tablet; Refill: 1  8. Simple chronic bronchitis (HCC)  Stable, doing well on [facility]

## 2016-04-20 ENCOUNTER — Other Ambulatory Visit: Payer: Self-pay

## 2016-04-20 NOTE — Telephone Encounter (Signed)
Patient requesting refill of Lansoprazole to [facility].

## 2016-04-21 MED ORDER — LANSOPRAZOLE 30 MG PO CPDR: 30.0000 mg | DELAYED_RELEASE_CAPSULE | Freq: Two times a day (BID) | ORAL | 1 refills | Status: DC

## 2016-04-24 ENCOUNTER — Other Ambulatory Visit: Payer: Self-pay | Admitting: Rheumatology

## 2016-04-24 NOTE — Telephone Encounter (Signed)
Patient's husband called requesting a refill on his wife's gabapentin.  States this need to go to the [facility] mail order pharmacy for 810 capsules.  Cb#[phone].

## 2016-04-24 NOTE — Telephone Encounter (Signed)
Last Visit: 01/13/16 Next Visit: 07/11/16  Okay to refill Gabapentin?

## 2016-04-24 NOTE — Telephone Encounter (Signed)
ok 

## 2016-04-25 MED ORDER — GABAPENTIN 300 MG PO CAPS: 900.0000 mg | ORAL_CAPSULE | Freq: Three times a day (TID) | ORAL | 0 refills | Status: DC

## 2016-04-27 ENCOUNTER — Telehealth: Payer: Self-pay | Admitting: *Deleted

## 2016-04-27 ENCOUNTER — Other Ambulatory Visit: Payer: Self-pay | Admitting: General Surgery

## 2016-04-27 DIAGNOSIS — R195 Other fecal abnormalities: Secondary | ICD-10-CM

## 2016-04-27 NOTE — Telephone Encounter (Signed)
Patient was contacted today and states she is feeling much better and would like to get her capsule endoscopy rescheduled.   This has been rescheduled for Tuesday, 05-02-16 at [facility].

## 2016-04-28 ENCOUNTER — Telehealth: Payer: Self-pay | Admitting: *Deleted

## 2016-04-28 ENCOUNTER — Telehealth: Payer: Self-pay | Admitting: Rheumatology

## 2016-04-28 NOTE — Telephone Encounter (Signed)
Patient's husband called about gabapentin rx. He states the rx was supposed to be a 90 day supply (810 capsules) but they only received a 60 day supply (540 capsules) from [facility]. He is wondering why patient did not receive the 90 day supply. Please call husband's cell phone number.

## 2016-04-28 NOTE — Telephone Encounter (Signed)
Prior Authorization completed and faxed on Gabapentin

## 2016-04-28 NOTE — Telephone Encounter (Signed)
Fritz Pickerel advised prescription was sent for 90 days. Advised to contact the pharmacy to see why they only sent 60 days. Advised a prior authorization was sent to insurance.

## 2016-05-01 ENCOUNTER — Ambulatory Visit (INDEPENDENT_AMBULATORY_CARE_PROVIDER_SITE_OTHER): Payer: Medicare Other | Admitting: Orthopedic Surgery

## 2016-05-01 ENCOUNTER — Encounter (INDEPENDENT_AMBULATORY_CARE_PROVIDER_SITE_OTHER): Payer: Self-pay | Admitting: Orthopedic Surgery

## 2016-05-01 DIAGNOSIS — I209 Angina pectoris, unspecified: Secondary | ICD-10-CM | POA: Diagnosis not present

## 2016-05-01 DIAGNOSIS — M79644 Pain in right finger(s): Secondary | ICD-10-CM

## 2016-05-01 DIAGNOSIS — M1812 Unilateral primary osteoarthritis of first carpometacarpal joint, left hand: Secondary | ICD-10-CM

## 2016-05-01 DIAGNOSIS — Z96641 Presence of right artificial hip joint: Secondary | ICD-10-CM

## 2016-05-01 DIAGNOSIS — M79645 Pain in left finger(s): Secondary | ICD-10-CM

## 2016-05-01 HISTORY — PX: FINGER ARTHROSCOPY WITH CARPOMETACARPEL (CMC) ARTHROPLASTY: SHX5629

## 2016-05-01 NOTE — Progress Notes (Signed)
I saw Jacqueline Lucas today in consultation for her left thumb basal joint arthritis.  She has failed conservative treatment so far with Dr. Marlou Sa and she now wishes to proceed with thumb [facility] arthroplasty.  I demonstrated on a hand model what the surgery entails and the associated r/b/a.  She voiced understanding and wishes to proceed.  We've got her scheduled for this Friday at the surgical center.  Please refer to Dr. Randel Pigg office visit note for details related to her hip replacement.    Azucena Cecil, MD [place] 8:36 PM

## 2016-05-02 ENCOUNTER — Encounter
Admission: RE | Disposition: A | Discharge status: Home / Self Care | Payer: Self-pay | Source: Ambulatory Visit | Attending: Unknown Physician Specialty

## 2016-05-02 ENCOUNTER — Ambulatory Visit
Admission: RE | Admit: 2016-05-02 | Discharge: 2016-05-02 | Disposition: A | Discharge status: Home / Self Care | Payer: Medicare Other | Source: Ambulatory Visit | Attending: Unknown Physician Specialty | Admitting: Unknown Physician Specialty

## 2016-05-02 DIAGNOSIS — K921 Melena: Secondary | ICD-10-CM | POA: Diagnosis not present

## 2016-05-02 DIAGNOSIS — R195 Other fecal abnormalities: Secondary | ICD-10-CM | POA: Diagnosis not present

## 2016-05-02 DIAGNOSIS — D509 Iron deficiency anemia, unspecified: Secondary | ICD-10-CM | POA: Diagnosis not present

## 2016-05-02 DIAGNOSIS — D649 Anemia, unspecified: Secondary | ICD-10-CM | POA: Insufficient documentation

## 2016-05-02 HISTORY — PX: GIVENS CAPSULE STUDY: SHX5432

## 2016-05-02 SURGERY — IMAGING PROCEDURE, GI TRACT, INTRALUMINAL, VIA CAPSULE

## 2016-05-03 ENCOUNTER — Encounter: Payer: Self-pay | Admitting: Unknown Physician Specialty

## 2016-05-03 NOTE — Progress Notes (Signed)
Office Visit Note   Patient: Jacqueline Lucas           Date of Birth: Jul 07, 1950           MRN: [id number] Visit Date: 05/01/2016 Requested by: Ashok Norris, MD No address on file PCP: Loistine Chance, MD  Subjective: Chief Complaint  Patient presents with  . Right Hip - Routine Post Op  . Right Hand - Pain  . Left Hand - Pain    HPI: Chanley is a 26 show patient 4 months out right hip replacement.  She also is having some left hand pain.  Reports left greater than right hand pain both of which are worsening.  Sits right hip is doing fine now.  She feels like it's "still healing".  Overall it is improving.  She is walking now unassisted.  She is right-hand dominant and reports some decreased strength in in her grip particularly pinch grip left worse than right.  Also having a little bit of numbness in the left hand digits 2 and 3.  NSAID helped a little bit.  Both hands are keeping her awake at night.  She is keeping her grandchild.              ROS: All systems reviewed are negative as they relate to the chief complaint within the history of present illness.  Patient denies  fevers or chills.   Assessment & Plan: Visit Diagnoses:  1. Bilateral thumb pain   2. Presence of right artificial hip joint     Plan: Impression is well-functioning right hip replacement.  A minimal release her in regards to the hip replacement.  She does have bilateral CMC arthritis and I have injected her multiple times in the past.  I will send her to one of my partners for consideration of LR TI.  In the see her back as needed.  Follow-Up Instructions: No Follow-up on file.   Orders:  No orders of the defined types were placed in this encounter.  No orders of the defined types were placed in this encounter.     Procedures: No procedures performed   Clinical Data: No additional findings.  Objective: Vital Signs: There were no vitals taken for this visit.  Physical Exam:    Constitutional: Patient appears well-developed HEENT:  Head: Normocephalic Eyes:EOM are normal Neck: Normal range of motion Cardiovascular: Normal rate Pulmonary/chest: Effort normal Neurologic: Patient is alert Skin: Skin is warm Psychiatric: Patient has normal mood and affect    Ortho Exam: Orthopedic exam demonstrates pretty normal gait and alignment equal leg lengths no pain with internal/external rotation of the leg.  Both hands are examined and she has good grip strength bilaterally but painful pinching bilaterally positive grind test both thumbs but it's more painful on the left.  Negative Tinel's cubital tunnel in the elbow bilaterally.  Wrist range of motion is full.  No tenderness in the first dorsal compartment on the left-hand side.  EPL FPL function is intact on the left.  Specialty Comments:  No specialty comments available.  Imaging: No results found.   PMFS History: Patient Active Problem List   Diagnosis Date Noted  . Bilateral thumb pain 05/01/2016  . Presence of right artificial hip joint 05/01/2016  . Chronic bronchitis ([place]) 04/14/2016  . Anemia 02/03/2016  . Status post bilateral total hip replacement 01/10/2016  . Sicca ([place]) 01/10/2016  . Age related osteoporosis 01/10/2016  . Hip arthritis 08/16/2015  . Microscopic hematuria 07/30/2015  .  Vaginal atrophy 07/30/2015  . GAD (generalized anxiety disorder) 07/08/2015  . Moderate episode of recurrent major depressive disorder ([place]) 07/08/2015  . Migraine with aura and without status migrainosus, not intractable 07/08/2015  . Dyslipidemia 07/08/2015  . Degenerative arthritis of right knee 03/16/2015  . Atrophic vaginitis 01/31/2015  . Recurrent UTI 12/31/2014  . IBS (irritable bowel syndrome) 10/29/2014  . Asthma, moderate persistent 09/08/2014  . Allergic state 09/07/2014  . Colon polyp 09/07/2014  . Hemorrhoid 09/07/2014  . Constrictive tenosynovitis 03/12/2014  . H/O total knee replacement,  bilateral 07/31/2013  . Angina pectoris ([place]) 11/28/2012  . Anxiety 11/28/2012  . Acid reflux 11/28/2012  . Cardiac murmur 11/28/2012  . BP (high blood pressure) 11/28/2012  . Cannot sleep 11/28/2012  . Adiposity 11/28/2012  . Arthritis, degenerative 11/28/2012  . Restless leg 11/28/2012  . Supraventricular tachycardia ([place]) 11/28/2012  . Fibromyalgia 11/28/2012   Past Medical History:  Diagnosis Date  . Allergy   . Allergy to adhesive tape    Allergy to paper tape as well  . Alzheimer disease   . Anemia   . Anginal pain ([place])   . Anxiety   . Arthritis   . Asthma   . Bronchitis   . Cataract   . Chronic nausea   . Claustrophobia   . DDD (degenerative disc disease), lumbar   . Depression   . Dry eyes    uses Restasis  . Fibromyalgia   . GERD (gastroesophageal reflux disease)   . H/O bladder infections   . Heart murmur   . Hematuria   . Hyperlipidemia   . Hypertension   . Hypothyroidism   . IBS (irritable bowel syndrome)   . Insomnia   . Lumbar neuritis   . Migraines   . Obesity   . Osteoporosis   . Palpitation   . PONV (postoperative nausea and vomiting)   . RLS (restless legs syndrome)   . Shoulder fracture, right   . Sicca ([place])   . Tachycardia   . Thyroid disease     Family History  Problem Relation Age of Onset  . Diabetes Mother   . Hypertension Mother   . Heart disease Mother   . Diabetes Father   . Cancer Father   . Hypertension Sister   . Depression Sister   . Cancer Brother   . Bladder Cancer Neg Hx   . Kidney disease Neg Hx     Past Surgical History:  Procedure Laterality Date  . ABDOMINAL HYSTERECTOMY     due to cancer of ovaries  . BACK SURGERY N/A 2014   x 5  . CARDIAC CATHETERIZATION     no PCI; 12/18/12 ([facility], Dr. Marcello Moores): EP study with ablation. No coronary angiography done then.  . COLONOSCOPY W/ POLYPECTOMY    . COLONOSCOPY WITH PROPOFOL N/A 02/04/2016   Procedure: COLONOSCOPY WITH PROPOFOL;  Surgeon: Robert Bellow, MD;   Location[facility] ENDOSCOPY;  Service: Endoscopy;  Laterality: N/A;  . ESOPHAGOGASTRODUODENOSCOPY (EGD) WITH PROPOFOL N/A 02/04/2016   Procedure: ESOPHAGOGASTRODUODENOSCOPY (EGD) WITH PROPOFOL;  Surgeon: Robert Bellow, MD;  Location: [facility] ENDOSCOPY;  Service: Endoscopy;  Laterality: N/A;  . GIVENS CAPSULE STUDY N/A 05/02/2016   Procedure: GIVENS CAPSULE STUDY;  Surgeon: Manya Silvas, MD;  Location: [facility] ENDOSCOPY;  Service: Endoscopy;  Laterality: N/A;  . HEMORRHOID SURGERY    . JOINT REPLACEMENT Bilateral    knee  . REPLACEMENT TOTAL KNEE Left   . SPINE SURGERY     x 2  .  tendonititis     right elbow, right wrist  . TOTAL HIP ARTHROPLASTY Left 2013  . TOTAL HIP ARTHROPLASTY Right 08/16/2015   Procedure: TOTAL HIP ARTHROPLASTY ANTERIOR APPROACH;  Surgeon: Meredith Pel, MD;  Location: [place];  Service: Orthopedics;  Laterality: Right;  . TOTAL KNEE ARTHROPLASTY Right 03/16/2015   Procedure: RIGHT TOTAL KNEE ARTHROPLASTY, PCL SACRIFICING;  Surgeon: Meredith Pel, MD;  Location: [place];  Service: Orthopedics;  Laterality: Right;   Social History   Occupational History  . Not on file.   Social History Main Topics  . Smoking status: Never Smoker  . Smokeless tobacco: Never Used  . Alcohol use No  . Drug use: No  . Sexual activity: No

## 2016-05-05 DIAGNOSIS — G8918 Other acute postprocedural pain: Secondary | ICD-10-CM | POA: Diagnosis not present

## 2016-05-05 DIAGNOSIS — M1811 Unilateral primary osteoarthritis of first carpometacarpal joint, right hand: Secondary | ICD-10-CM | POA: Diagnosis not present

## 2016-05-05 DIAGNOSIS — M1812 Unilateral primary osteoarthritis of first carpometacarpal joint, left hand: Secondary | ICD-10-CM | POA: Diagnosis not present

## 2016-05-06 ENCOUNTER — Ambulatory Visit: Payer: Medicare Other

## 2016-05-12 ENCOUNTER — Encounter (INDEPENDENT_AMBULATORY_CARE_PROVIDER_SITE_OTHER): Payer: Self-pay | Admitting: Orthopaedic Surgery

## 2016-05-12 DIAGNOSIS — M1812 Unilateral primary osteoarthritis of first carpometacarpal joint, left hand: Secondary | ICD-10-CM | POA: Insufficient documentation

## 2016-05-19 ENCOUNTER — Ambulatory Visit (INDEPENDENT_AMBULATORY_CARE_PROVIDER_SITE_OTHER): Payer: Medicare Other | Admitting: Orthopaedic Surgery

## 2016-05-19 ENCOUNTER — Encounter (INDEPENDENT_AMBULATORY_CARE_PROVIDER_SITE_OTHER): Payer: Self-pay | Admitting: Orthopaedic Surgery

## 2016-05-19 DIAGNOSIS — M1812 Unilateral primary osteoarthritis of first carpometacarpal joint, left hand: Secondary | ICD-10-CM

## 2016-05-19 MED ORDER — OXYCODONE HCL 5 MG PO TABS: 5.0000 mg | ORAL_TABLET | Freq: Two times a day (BID) | ORAL | 0 refills | Status: DC | PRN

## 2016-05-19 NOTE — Progress Notes (Signed)
Patient is 2 weeks status post left thumb CMC arthroplasty. She is doing well. Taking just and oxycodone at night for the pain. Her surgical incisions are healed without any signs of infection just some local erythema. No drainage. She is neurovascularly intact. The sutures were removed today and a thumb spica brace was applied. She is to wear this at all times. I'll like to see her back in 2 weeks for a recheck in hand therapy at that time. Oxycodone was refilled today.

## 2016-05-29 ENCOUNTER — Telehealth: Payer: Self-pay | Admitting: General Surgery

## 2016-05-29 ENCOUNTER — Other Ambulatory Visit: Payer: Self-pay

## 2016-05-29 DIAGNOSIS — D649 Anemia, unspecified: Secondary | ICD-10-CM

## 2016-05-29 NOTE — Telephone Encounter (Signed)
The patient was notified that her recent capsule endoscopy did not show any evidence of ulceration or bleeding. There were a few petechia noted, compatible with nonsteroidal use, but no evidence of bleeding. Previous upper and lower endoscopy showed a hyperplastic polyp in the colon and a benign fundic gland polyp in the stomach, neither side with had evidence of bleeding.  Stool Hemoccults 6 were positive prior to her capsule endoscopy, and she was not making use of any iron supplements at the time the study was completed.  We'll arrange to have a repeat anemia lab work studies completed and a follow-up visit afterwards.

## 2016-06-05 ENCOUNTER — Ambulatory Visit (INDEPENDENT_AMBULATORY_CARE_PROVIDER_SITE_OTHER): Payer: Medicare Other | Admitting: Orthopaedic Surgery

## 2016-06-05 ENCOUNTER — Telehealth (INDEPENDENT_AMBULATORY_CARE_PROVIDER_SITE_OTHER): Payer: Self-pay | Admitting: *Deleted

## 2016-06-05 NOTE — Telephone Encounter (Signed)
Please advise 

## 2016-06-05 NOTE — Telephone Encounter (Signed)
Sure, she can have it removed and placed in a removable brace

## 2016-06-05 NOTE — Telephone Encounter (Signed)
Pt spouse called stating pt has a migrane and has been vomiting. Stated pt has a removable cast on and they are asking if they could take the removable cast off or if they need to come in.

## 2016-06-06 ENCOUNTER — Other Ambulatory Visit: Payer: Self-pay | Admitting: Family Medicine

## 2016-06-06 DIAGNOSIS — E785 Hyperlipidemia, unspecified: Secondary | ICD-10-CM

## 2016-06-06 NOTE — Telephone Encounter (Signed)
Called pt to advise. She has a f/u appt on Monday. We will see her then.

## 2016-06-07 ENCOUNTER — Ambulatory Visit: Payer: Medicare Other | Admitting: General Surgery

## 2016-06-08 DIAGNOSIS — D649 Anemia, unspecified: Secondary | ICD-10-CM | POA: Diagnosis not present

## 2016-06-09 LAB — CBC WITH DIFFERENTIAL/PLATELET
Basophils Absolute: 0 10*3/uL (ref 0.0–0.2)
Basos: 1 %
EOS (ABSOLUTE): 0.3 10*3/uL (ref 0.0–0.4)
Eos: 6 %
Hematocrit: 40.5 % (ref 34.0–46.6)
Hemoglobin: 13.6 g/dL (ref 11.1–15.9)
Immature Grans (Abs): 0 10*3/uL (ref 0.0–0.1)
Immature Granulocytes: 0 %
Lymphocytes Absolute: 1.6 10*3/uL (ref 0.7–3.1)
Lymphs: 26 %
MCH: 29.4 pg (ref 26.6–33.0)
MCHC: 33.6 g/dL (ref 31.5–35.7)
MCV: 88 fL (ref 79–97)
Monocytes Absolute: 0.6 10*3/uL (ref 0.1–0.9)
Monocytes: 10 %
Neutrophils Absolute: 3.6 10*3/uL (ref 1.4–7.0)
Neutrophils: 57 %
Platelets: 226 10*3/uL (ref 150–379)
RBC: 4.62 x10E6/uL (ref 3.77–5.28)
RDW: 14.9 % (ref 12.3–15.4)
WBC: 6.2 10*3/uL (ref 3.4–10.8)

## 2016-06-09 LAB — IRON AND TIBC
Iron Saturation: 21 % (ref 15–55)
Iron: 65 ug/dL (ref 27–139)
Total Iron Binding Capacity: 308 ug/dL (ref 250–450)
UIBC: 243 ug/dL (ref 118–369)

## 2016-06-09 LAB — FERRITIN: Ferritin: 33 ng/mL (ref 15–150)

## 2016-06-11 ENCOUNTER — Other Ambulatory Visit (INDEPENDENT_AMBULATORY_CARE_PROVIDER_SITE_OTHER): Payer: Self-pay | Admitting: Orthopedic Surgery

## 2016-06-12 ENCOUNTER — Ambulatory Visit (INDEPENDENT_AMBULATORY_CARE_PROVIDER_SITE_OTHER): Payer: Medicare Other | Admitting: Orthopaedic Surgery

## 2016-06-12 ENCOUNTER — Encounter (INDEPENDENT_AMBULATORY_CARE_PROVIDER_SITE_OTHER): Payer: Self-pay | Admitting: Orthopaedic Surgery

## 2016-06-12 DIAGNOSIS — M1812 Unilateral primary osteoarthritis of first carpometacarpal joint, left hand: Secondary | ICD-10-CM

## 2016-06-12 NOTE — Telephone Encounter (Signed)
Okay to refill please call thanks

## 2016-06-12 NOTE — Progress Notes (Signed)
Jacqueline Lucas is 5 weeks status post left thumb CMC arthroplasty. She is overall doing well. She complains of some wrist stiffness but is able to move her thumb and fingers around well. She can wearing a thumb spica brace. Physical exam shows no signs of infection. Her range of motion is appropriate. Weakness of grip as expected. Hand therapy referral was made today. Follow-up with me in about 6 weeks for recheck.

## 2016-06-12 NOTE — Telephone Encounter (Signed)
Rx request 

## 2016-06-13 ENCOUNTER — Ambulatory Visit: Payer: Medicare Other | Admitting: General Surgery

## 2016-06-14 ENCOUNTER — Ambulatory Visit: Payer: Medicare Other

## 2016-06-15 ENCOUNTER — Telehealth: Payer: Self-pay

## 2016-06-15 NOTE — Telephone Encounter (Signed)
-----   Message from Robert Bellow, MD sent at 06/14/2016  9:23 PM EDT ----- Notify the patient her labs look markedly better. It appears that she was to come in yesterday but did not. She should reschedule. ----- Message ----- From: Lavone Neri Lab Results In Sent: 06/09/2016   7:38 AM To: Robert Bellow, MD

## 2016-06-21 ENCOUNTER — Other Ambulatory Visit: Payer: Self-pay | Admitting: Family Medicine

## 2016-06-21 DIAGNOSIS — F411 Generalized anxiety disorder: Secondary | ICD-10-CM

## 2016-06-21 DIAGNOSIS — F331 Major depressive disorder, recurrent, moderate: Secondary | ICD-10-CM

## 2016-06-21 MED ORDER — VENLAFAXINE HCL ER 150 MG PO CP24: 150.0000 mg | ORAL_CAPSULE | Freq: Every day | ORAL | 0 refills | Status: DC

## 2016-06-21 NOTE — Telephone Encounter (Signed)
Requesting a refill on venlafaxine HCR 150mg . Asking that you send to [facility] with a 90 day supply. It is okay to leave message  [phone]

## 2016-06-23 ENCOUNTER — Ambulatory Visit
Admission: RE | Admit: 2016-06-23 | Discharge: 2016-06-23 | Disposition: A | Discharge status: Home / Self Care | Payer: Medicare Other | Source: Ambulatory Visit | Attending: Nurse Practitioner | Admitting: Nurse Practitioner

## 2016-06-23 DIAGNOSIS — R42 Dizziness and giddiness: Secondary | ICD-10-CM | POA: Insufficient documentation

## 2016-06-23 DIAGNOSIS — R51 Headache: Secondary | ICD-10-CM | POA: Diagnosis not present

## 2016-06-23 DIAGNOSIS — G939 Disorder of brain, unspecified: Secondary | ICD-10-CM | POA: Diagnosis not present

## 2016-06-23 LAB — POCT I-STAT CREATININE: Creatinine, Ser: 0.8 mg/dL (ref 0.44–1.00)

## 2016-06-23 MED ORDER — GADOBENATE DIMEGLUMINE 529 MG/ML IV SOLN
20.0000 mL | Freq: Once | INTRAVENOUS | Status: AC | PRN
  Administered 2016-06-23: 20 mL via INTRAVENOUS

## 2016-06-26 ENCOUNTER — Encounter: Payer: Self-pay | Admitting: Family Medicine

## 2016-06-27 NOTE — Telephone Encounter (Signed)
Notified patient's husband of results. He will call back to reschedule her appointment after he speaks with her.

## 2016-07-03 ENCOUNTER — Ambulatory Visit: Payer: Medicare Other | Attending: Orthopaedic Surgery | Admitting: Occupational Therapy

## 2016-07-03 DIAGNOSIS — M25632 Stiffness of left wrist, not elsewhere classified: Secondary | ICD-10-CM | POA: Insufficient documentation

## 2016-07-03 DIAGNOSIS — M25642 Stiffness of left hand, not elsewhere classified: Secondary | ICD-10-CM

## 2016-07-03 DIAGNOSIS — M25532 Pain in left wrist: Secondary | ICD-10-CM | POA: Diagnosis not present

## 2016-07-03 DIAGNOSIS — M79642 Pain in left hand: Secondary | ICD-10-CM | POA: Insufficient documentation

## 2016-07-03 DIAGNOSIS — M6281 Muscle weakness (generalized): Secondary | ICD-10-CM | POA: Insufficient documentation

## 2016-07-03 DIAGNOSIS — L905 Scar conditions and fibrosis of skin: Secondary | ICD-10-CM | POA: Diagnosis not present

## 2016-07-03 NOTE — Therapy (Signed)
[facility] [address] [place], [address] Phone: [phone]   Fax:  [phone]  Occupational Therapy Evaluation  Patient Details  Name: Jacqueline Lucas MRN: [id number] Date of Birth: 02/18/1951 Referring Provider: Dr Erlinda Hong  Encounter Date: 07/03/2016      OT End of Session - 07/03/16 1855    Visit Number 1   Number of Visits 12   Date for OT Re-Evaluation 08/14/16   OT Start Time 1358   OT Stop Time 1448   OT Time Calculation (min) 50 min   Activity Tolerance Patient tolerated treatment well   Behavior During Therapy [facility] for tasks assessed/performed      Past Medical History:  Diagnosis Date  . Allergy   . Allergy to adhesive tape    Allergy to paper tape as well  . Alzheimer disease   . Anemia   . Anginal pain ([place])   . Anxiety   . Arthritis   . Asthma   . Bronchitis   . Cataract   . Chronic nausea   . Claustrophobia   . DDD (degenerative disc disease), lumbar   . Depression   . Dry eyes    uses Restasis  . Fibromyalgia   . GERD (gastroesophageal reflux disease)   . H/O bladder infections   . Heart murmur   . Hematuria   . Hyperlipidemia   . Hypertension   . Hypothyroidism   . IBS (irritable bowel syndrome)   . Insomnia   . Lumbar neuritis   . Migraines   . Obesity   . Osteoporosis   . Palpitation   . PONV (postoperative nausea and vomiting)   . RLS (restless legs syndrome)   . Shoulder fracture, right   . Sicca ([place])   . Tachycardia   . Thyroid disease     Past Surgical History:  Procedure Laterality Date  . ABDOMINAL HYSTERECTOMY     due to cancer of ovaries  . BACK SURGERY N/A 2014   x 5  . CARDIAC CATHETERIZATION     no PCI; 12/18/12 ([facility], Dr. Marcello Moores): EP study with ablation. No coronary angiography done then.  . COLONOSCOPY W/ POLYPECTOMY    . COLONOSCOPY WITH PROPOFOL N/A 02/04/2016   Procedure: COLONOSCOPY WITH PROPOFOL;  Surgeon: Robert Bellow, MD;   Location: [facility] ENDOSCOPY;  Service: Endoscopy;  Laterality: N/A;  . ESOPHAGOGASTRODUODENOSCOPY (EGD) WITH PROPOFOL N/A 02/04/2016   Procedure: ESOPHAGOGASTRODUODENOSCOPY (EGD) WITH PROPOFOL;  Surgeon: Robert Bellow, MD;  Location: [facility] ENDOSCOPY;  Service: Endoscopy;  Laterality: N/A;  . GIVENS CAPSULE STUDY N/A 05/02/2016   Procedure: GIVENS CAPSULE STUDY;  Surgeon: Manya Silvas, MD;  Location: [facility] ENDOSCOPY;  Service: Endoscopy;  Laterality: N/A;  . HEMORRHOID SURGERY    . JOINT REPLACEMENT Bilateral    knee  . REPLACEMENT TOTAL KNEE Left   . SPINE SURGERY     x 2  . tendonititis     right elbow, right wrist  . TOTAL HIP ARTHROPLASTY Left 2013  . TOTAL HIP ARTHROPLASTY Right 08/16/2015   Procedure: TOTAL HIP ARTHROPLASTY ANTERIOR APPROACH;  Surgeon: Meredith Pel, MD;  Location: [place];  Service: Orthopedics;  Laterality: Right;  . TOTAL KNEE ARTHROPLASTY Right 03/16/2015   Procedure: RIGHT TOTAL KNEE ARTHROPLASTY, PCL SACRIFICING;  Surgeon: Meredith Pel, MD;  Location: [place];  Service: Orthopedics;  Laterality: Right;    There were no vitals filed for this visit.  Subjective Assessment - 07/03/16 1837    Subjective  Had my thumb surgery about 8 wks ago - seen Dr 4/16 and stop wearing splint - but fell in park little more than week ago -and having some wrist pain, some bruising and numbness in my fingers-  I have appt with Dr Erlinda Hong tomorrow-tripped over my great granddaughters toys the other day too    Patient Stated Goals I want the thumb pain better and to use my L hand and thumb without any issues    Currently in Pain? Yes   Pain Score 3    Pain Location Hand   Pain Orientation Left   Pain Descriptors / Indicators Aching   Pain Type Surgical pain   Pain Onset More than a month ago   Pain Frequency Constant           [facility] OT Assessment - 07/03/16 0001      Assessment   Diagnosis L thumb CMC arhroplasty    Referring Provider Dr Erlinda Hong   Onset Date 05/02/16      Precautions   Required Braces or Orthoses --  fitted with neoprene CMC      Balance Screen   Has the patient fallen in the past 6 months Yes   How many times? 2   Has the patient had a decrease in activity level because of a fear of falling?  No   Is the patient reluctant to leave their home because of a fear of falling?  No     Home  Environment   Lives With Spouse     Prior Function   Vocation Retired   Leisure R hand dominant , read, play games , watch great grand daugther ,      AROM   Right Wrist Extension 70 Degrees   Right Wrist Flexion 75 Degrees   Right Wrist Radial Deviation 18 Degrees   Right Wrist Ulnar Deviation 26 Degrees   Left Wrist Extension 52 Degrees   Left Wrist Flexion 45 Degrees   Left Wrist Radial Deviation 8 Degrees   Left Wrist Ulnar Deviation 27 Degrees     Right Hand AROM   R Thumb MCP 0-60 60 Degrees   R Thumb IP 0-80 68 Degrees   R Thumb Radial ABduction/ADduction 0-55 45   R Thumb Palmar ABduction/ADduction 0-45 68     Left Hand AROM   L Thumb MCP 0-60 35 Degrees   L Thumb IP 0-80 55 Degrees   L Thumb Radial ADduction/ABduction 0-55 38   L Thumb Palmar ADduction/ABduction 0-45 45   L Thumb Opposition to Index --  opposition 1 cm lag from DIP fo 5th      review HEP with pt and husband     Contrast to wrist and hand  AROM for L thumb PA , RA  AROM bloced IP and MP flexion L thumb  Opposition pick up 1 cm foam block - alternate digits   Scar massage ed on and cica scar pad wearing at nigth time  2-3 x day   Until appt with MD   CMC neoprene to use with act that cause pain in thumb                    OT Education - 07/03/16 1854    Education provided Yes   Education Details findings and HEP,CMC neoprene splint wearing   Person(s) Educated Patient;Spouse   Methods Explanation;Demonstration;Tactile cues;Verbal cues;Handout   Comprehension Verbal cues required;Returned  demonstration;Verbalized understanding           OT Short Term Goals - 07/03/16 1902      OT SHORT TERM GOAL #1   Title Pain on PRWHE improve with 20 points    Baseline At eval PRWHE pain score 32/50   Time 2   Period Weeks   Status New     OT SHORT TERM GOAL #2   Title L wrist AROM improve to [facility] to turn doorknob, wipe table, bath and dress    Baseline see flowsheet- impaired in all planes - and unable to use in these act   Time 3   Period Weeks   Status New     OT SHORT TERM GOAL #3   Title L thumb AROM improve to [facility] to grasp around glass, do buttons, turn doorknob    Baseline impaired in all planes    Time 3   Period Weeks   Status New           OT Long Term Goals - 07/03/16 1906      OT LONG TERM GOAL #1   Title Function score on PRHWE improve with at least 20 points    Baseline at eval function score 47.5/50 on PRWHE   Time 5   Period Weeks   Status New     OT LONG TERM GOAL #2   Title L grip strength increase to more than 50% compare to R to turn doorknob , carry more than 5 lbs, cut with knife and peel,    Baseline NT because of pain - appt with MD tomorrow   Time 6   Period Weeks   Status New     OT LONG TERM GOAL #3   Title L Prehension strength increase to 50% compare to R hand to cut with knife, open packages and hold plate   Baseline NT secondary increase pain - appt with MD tomorrow   Time 6   Period Weeks   Status New               Plan - 07/03/16 1856    Clinical Impression Statement Pt present 8wks s/p L thumb CMC arthroplasty - pt arrive with no splint - pt report she fell about week and 1/2 in park and caught herself  with her palm - since then had some bruising , pain at wrist and numbness in fingers - could be she bruised CT - pt to do  contrast - fitted with CMC neoprene splint to use with painfull act at thumb - only done AROM assessment - held of on strength testing until after MD appt -  pt show decrease ROM at thumb and wrist in all planes, increase scar tissue , and   pain and decrease functional use of L hand and wrist - pt provided with HEP for thumb AROM in all planes    Rehab Potential Good   OT Frequency 2x / week   OT Duration 6 weeks   OT Treatment/Interventions Self-care/ADL training;Fluidtherapy;Splinting;Patient/family education;Therapeutic exercises;Contrast Bath;Ultrasound;Scar mobilization;Passive range of motion;Manual Therapy;Parrafin;Cryotherapy   Plan results from appt with surgeon , progress with HEP    OT Home Exercise Plan see pt instruction   Consulted and Agree with Plan of Care Patient      Patient will benefit from skilled therapeutic intervention in order to improve the following deficits and impairments:  Decreased coordination, Decreased range of motion, Impaired flexibility, Impaired sensation, Pain, Impaired UE functional use, Decreased scar mobility,  Decreased knowledge of use of DME, Decreased strength  Visit Diagnosis: Pain in left hand - Plan: Ot plan of care cert/re-cert  Pain in left wrist - Plan: Ot plan of care cert/re-cert  Scar condition and fibrosis of skin - Plan: Ot plan of care cert/re-cert  Stiffness of left hand, not elsewhere classified - Plan: Ot plan of care cert/re-cert  Stiffness of left wrist, not elsewhere classified - Plan: Ot plan of care cert/re-cert  Muscle weakness (generalized) - Plan: Ot plan of care cert/re-cert      G-Codes - 33/61/22 1920    Functional Assessment Tool Used (Outpatient only) ROM , grip and prehension , PRHWE, clinical judgements   Functional Limitation Self care   Self Care Current Status (E4975) At least 40 percent but less than 60 percent impaired, limited or restricted   Self Care Goal Status (P0051) At least 1 percent but less than 20 percent impaired, limited or restricted      Problem List Patient Active Problem List   Diagnosis Date Noted  . Arthritis of carpometacarpal [facility]) joint of left thumb 05/12/2016  . Bilateral thumb pain 05/01/2016  . Presence of  right artificial hip joint 05/01/2016  . Chronic bronchitis ([place]) 04/14/2016  . Anemia 02/03/2016  . Status post bilateral total hip replacement 01/10/2016  . Sicca ([place]) 01/10/2016  . Age related osteoporosis 01/10/2016  . Hip arthritis 08/16/2015  . Microscopic hematuria 07/30/2015  . Vaginal atrophy 07/30/2015  . GAD (generalized anxiety disorder) 07/08/2015  . Moderate episode of recurrent major depressive disorder ([place]) 07/08/2015  . Migraine with aura and without status migrainosus, not intractable 07/08/2015  . Dyslipidemia 07/08/2015  . Degenerative arthritis of right knee 03/16/2015  . Atrophic vaginitis 01/31/2015  . Recurrent UTI 12/31/2014  . IBS (irritable bowel syndrome) 10/29/2014  . Asthma, moderate persistent 09/08/2014  . Allergic state 09/07/2014  . Colon polyp 09/07/2014  . Hemorrhoid 09/07/2014  . Constrictive tenosynovitis 03/12/2014  . H/O total knee replacement, bilateral 07/31/2013  . Angina pectoris ([place]) 11/28/2012  . Anxiety 11/28/2012  . Acid reflux 11/28/2012  . Cardiac murmur 11/28/2012  . BP (high blood pressure) 11/28/2012  . Cannot sleep 11/28/2012  . Adiposity 11/28/2012  . Arthritis, degenerative 11/28/2012  . Restless leg 11/28/2012  . Supraventricular tachycardia ([place]) 11/28/2012  . Fibromyalgia 11/28/2012    Rosalyn Gess OTR/L,CLT 07/03/2016, 7:24 PM   [place] [facility] [address], [place], [address] Phone: [phone]   Fax:  [phone]  Name: Jacqueline Lucas MRN: [id number] Date of Birth: August 01, 1950

## 2016-07-03 NOTE — Patient Instructions (Addendum)
Contrast to wrist and hand  AROM for L thumb PA , RA  AROM bloced IP and MP flexion L thumb  Opposition pick up 1 cm foam block - alternate digits   Scar massage ed on and cica scar pad wearing at nigth time   Until appt with MD   CMC neoprene to use with act that cause pain in thumb

## 2016-07-04 ENCOUNTER — Ambulatory Visit (INDEPENDENT_AMBULATORY_CARE_PROVIDER_SITE_OTHER): Payer: Medicare Other | Admitting: Orthopaedic Surgery

## 2016-07-04 ENCOUNTER — Other Ambulatory Visit: Payer: Self-pay | Admitting: Family Medicine

## 2016-07-04 ENCOUNTER — Encounter (INDEPENDENT_AMBULATORY_CARE_PROVIDER_SITE_OTHER): Payer: Self-pay | Admitting: Orthopaedic Surgery

## 2016-07-04 DIAGNOSIS — M1812 Unilateral primary osteoarthritis of first carpometacarpal joint, left hand: Secondary | ICD-10-CM

## 2016-07-04 DIAGNOSIS — F3160 Bipolar disorder, current episode mixed, unspecified: Secondary | ICD-10-CM

## 2016-07-04 NOTE — Addendum Note (Signed)
Addended by: Precious Bard on: 07/04/2016 01:46 PM   Modules accepted: Orders

## 2016-07-04 NOTE — Telephone Encounter (Signed)
Pt needs refill on Prevacid, Divalproex Sodium Dr tabs to be sent to [facility]. Pt has an appt on 07/12/16.

## 2016-07-04 NOTE — Progress Notes (Signed)
Patient follows up today for her thumb CMC arthritis and new problem of left index and middle finger pain and numbness. She's had a fall recently but this was going on before the fall. Endorses numbness and tingling and weakness. On exam she has positive Durkin's and negative Tinel and Phalen. The surgical scar is fully healed. I will like to get nerve conduction studies to assess for left carpal tunnel syndrome. Follow-up week.

## 2016-07-05 MED ORDER — LANSOPRAZOLE 30 MG PO CPDR: 30.0000 mg | DELAYED_RELEASE_CAPSULE | Freq: Two times a day (BID) | ORAL | 1 refills | Status: DC

## 2016-07-05 MED ORDER — DIVALPROEX SODIUM 250 MG PO DR TAB: 250.0000 mg | DELAYED_RELEASE_TABLET | Freq: Two times a day (BID) | ORAL | 0 refills | Status: DC

## 2016-07-07 ENCOUNTER — Ambulatory Visit: Payer: Medicare Other | Admitting: Occupational Therapy

## 2016-07-07 DIAGNOSIS — M25632 Stiffness of left wrist, not elsewhere classified: Secondary | ICD-10-CM

## 2016-07-07 DIAGNOSIS — M6281 Muscle weakness (generalized): Secondary | ICD-10-CM

## 2016-07-07 DIAGNOSIS — L905 Scar conditions and fibrosis of skin: Secondary | ICD-10-CM | POA: Diagnosis not present

## 2016-07-07 DIAGNOSIS — M25642 Stiffness of left hand, not elsewhere classified: Secondary | ICD-10-CM | POA: Diagnosis not present

## 2016-07-07 DIAGNOSIS — M25532 Pain in left wrist: Secondary | ICD-10-CM | POA: Diagnosis not present

## 2016-07-07 DIAGNOSIS — M79642 Pain in left hand: Secondary | ICD-10-CM

## 2016-07-07 NOTE — Therapy (Signed)
[facility] [address] [place], [address] Phone: [phone]   Fax:  [phone]  Occupational Therapy Treatment  Patient Details  Name: Jacqueline Lucas MRN: [id number] Date of Birth: May 12, 1950 Referring Provider: Dr Erlinda Hong  Encounter Date: 07/07/2016      OT End of Session - 07/07/16 1642    Visit Number 2   Number of Visits 12   Date for OT Re-Evaluation 08/14/16   OT Start Time 1314   OT Stop Time 1410   OT Time Calculation (min) 56 min   Activity Tolerance Patient tolerated treatment well   Behavior During Therapy [facility] for tasks assessed/performed      Past Medical History:  Diagnosis Date  . Allergy   . Allergy to adhesive tape    Allergy to paper tape as well  . Alzheimer disease   . Anemia   . Anginal pain ([place])   . Anxiety   . Arthritis   . Asthma   . Bronchitis   . Cataract   . Chronic nausea   . Claustrophobia   . DDD (degenerative disc disease), lumbar   . Depression   . Dry eyes    uses Restasis  . Fibromyalgia   . GERD (gastroesophageal reflux disease)   . H/O bladder infections   . Heart murmur   . Hematuria   . Hyperlipidemia   . Hypertension   . Hypothyroidism   . IBS (irritable bowel syndrome)   . Insomnia   . Lumbar neuritis   . Migraines   . Obesity   . Osteoporosis   . Palpitation   . PONV (postoperative nausea and vomiting)   . RLS (restless legs syndrome)   . Shoulder fracture, right   . Sicca ([place])   . Tachycardia   . Thyroid disease     Past Surgical History:  Procedure Laterality Date  . ABDOMINAL HYSTERECTOMY     due to cancer of ovaries  . BACK SURGERY N/A 2014   x 5  . CARDIAC CATHETERIZATION     no PCI; 12/18/12 ([facility], Dr. Marcello Moores): EP study with ablation. No coronary angiography done then.  . COLONOSCOPY W/ POLYPECTOMY    . COLONOSCOPY WITH PROPOFOL N/A 02/04/2016   Procedure: COLONOSCOPY WITH PROPOFOL;  Surgeon: Robert Bellow, MD;   Location: [facility] ENDOSCOPY;  Service: Endoscopy;  Laterality: N/A;  . ESOPHAGOGASTRODUODENOSCOPY (EGD) WITH PROPOFOL N/A 02/04/2016   Procedure: ESOPHAGOGASTRODUODENOSCOPY (EGD) WITH PROPOFOL;  Surgeon: Robert Bellow, MD;  Location: [facility] ENDOSCOPY;  Service: Endoscopy;  Laterality: N/A;  . GIVENS CAPSULE STUDY N/A 05/02/2016   Procedure: GIVENS CAPSULE STUDY;  Surgeon: Manya Silvas, MD;  Location: [facility] ENDOSCOPY;  Service: Endoscopy;  Laterality: N/A;  . HEMORRHOID SURGERY    . JOINT REPLACEMENT Bilateral    knee  . REPLACEMENT TOTAL KNEE Left   . SPINE SURGERY     x 2  . tendonititis     right elbow, right wrist  . TOTAL HIP ARTHROPLASTY Left 2013  . TOTAL HIP ARTHROPLASTY Right 08/16/2015   Procedure: TOTAL HIP ARTHROPLASTY ANTERIOR APPROACH;  Surgeon: Meredith Pel, MD;  Location: [place];  Service: Orthopedics;  Laterality: Right;  . TOTAL KNEE ARTHROPLASTY Right 03/16/2015   Procedure: RIGHT TOTAL KNEE ARTHROPLASTY, PCL SACRIFICING;  Surgeon: Meredith Pel, MD;  Location: [place];  Service: Orthopedics;  Laterality: Right;    There were no vitals filed for this visit.  Subjective Assessment - 07/07/16 1620    Subjective  Seen Dr and Odette Horns showed all good - maybe have some CTS - did had some numbnesss prior to surgery - but did not tell him- going to have nerve conduction test early next week - trying to use it more - pain at wrist    Patient Stated Goals I want the thumb pain better and to use my L hand and thumb without any issues    Currently in Pain? Yes   Pain Score 2    Pain Location Wrist   Pain Orientation Left   Pain Descriptors / Indicators Aching   Pain Type Surgical pain                      OT Treatments/Exercises (OP) - 07/07/16 0001      Ultrasound   Ultrasound Location over CT proximal flexor tendons on forearm   Ultrasound Parameters 3.3MHZ, ; 20%, 0.8 - 7 min   Ultrasound Goals Pain     LUE Fluidotherapy   Number Minutes  Fluidotherapy 10 Minutes   LUE Fluidotherapy Location Hand;Wrist   Comments AROM in fluido for thumb and wrist in all planes to decrease pain and increase ROM     AROM measured after fluido  see flowsheet  Soft tissue mobs after fluido - in webspace, and thumb stretches gentle  MC spreads and CT spreads  graston tools on volar forearm - sweeping and nr 2 Scar massage - mobs   AROM for L thumb PA , RA  AROM bloced IP and MP flexion L thumb  AAROM for wrist ext, AROM wrist flexion but do not force RD and UD AROM   US done - see flowsheet  Fitted with isotoner glove for night time and wrist splint  To decrease CT symptoms   Cont with  cica scar pad wearing at nigth time    CMC neoprene to use with act that cause pain in thumb             OT Education - 07/07/16 1641    Education provided Yes   Education Details HEP changes   Person(s) Educated Patient   Methods Explanation;Demonstration;Tactile cues;Verbal cues   Comprehension Verbal cues required;Returned demonstration;Verbalized understanding          OT Short Term Goals - 07/03/16 1902      OT SHORT TERM GOAL #1   Title Pain on PRWHE improve with 20 points    Baseline At eval PRWHE pain score 32/50   Time 2   Period Weeks   Status New     OT SHORT TERM GOAL #2   Title L wrist AROM improve to [facility] to turn doorknob, wipe table, bath and dress    Baseline see flowsheet- impaired in all planes - and unable to use in these act   Time 3   Period Weeks   Status New     OT SHORT TERM GOAL #3   Title L thumb AROM improve to [facility] to grasp around glass, do buttons, turn doorknob    Baseline impaired in all planes    Time 3   Period Weeks   Status New           OT Long Term Goals - 07/03/16 1906      OT LONG TERM GOAL #1   Title Function score on PRHWE improve with at least 20 points    Baseline at eval function score 47.5/50 on  PRWHE   Time 5   Period Weeks   Status New     OT LONG TERM GOAL #2    Title L grip strength increase to more than 50% compare to R to turn doorknob , carry more than 5 lbs, cut with knife and peel,    Baseline NT because of pain - appt with MD tomorrow   Time 6   Period Weeks   Status New     OT LONG TERM GOAL #3   Title L Prehension strength increase to 50% compare to R hand to cut with knife, open packages and hold plate   Baseline NT secondary increase pain - appt with MD tomorrow   Time 6   Period Weeks   Status New               Plan - 07/07/16 1642    Clinical Impression Statement Pt seen Dr and no fracture or injury after fall in park but still numbness and pins and needles in fingers - DR ordered nerve conduction early next week - CT symptoms - I had some symptoms prior to surgery - pt showed this date increase AROM in thumb - pain over CT and decrease end of session    Rehab Potential Good   OT Frequency 2x / week   OT Duration 6 weeks   OT Treatment/Interventions Self-care/ADL training;Fluidtherapy;Splinting;Patient/family education;Therapeutic exercises;Contrast Bath;Ultrasound;Scar mobilization;Passive range of motion;Manual Therapy;Parrafin;Cryotherapy   Plan assess CT symptoms - how doing with splints and HEP    OT Home Exercise Plan see pt instruction   Consulted and Agree with Plan of Care Patient      Patient will benefit from skilled therapeutic intervention in order to improve the following deficits and impairments:  Decreased coordination, Decreased range of motion, Impaired flexibility, Impaired sensation, Pain, Impaired UE functional use, Decreased scar mobility, Decreased knowledge of use of DME, Decreased strength  Visit Diagnosis: Pain in left hand  Pain in left wrist  Scar condition and fibrosis of skin  Stiffness of left hand, not elsewhere classified  Stiffness of left wrist, not elsewhere classified  Muscle weakness (generalized)    Problem List Patient Active Problem List   Diagnosis Date Noted  .  Arthritis of carpometacarpal [facility]) joint of left thumb 05/12/2016  . Bilateral thumb pain 05/01/2016  . Presence of right artificial hip joint 05/01/2016  . Chronic bronchitis ([place]) 04/14/2016  . Anemia 02/03/2016  . Status post bilateral total hip replacement 01/10/2016  . Sicca ([place]) 01/10/2016  . Age related osteoporosis 01/10/2016  . Hip arthritis 08/16/2015  . Microscopic hematuria 07/30/2015  . Vaginal atrophy 07/30/2015  . GAD (generalized anxiety disorder) 07/08/2015  . Moderate episode of recurrent major depressive disorder ([place]) 07/08/2015  . Migraine with aura and without status migrainosus, not intractable 07/08/2015  . Dyslipidemia 07/08/2015  . Degenerative arthritis of right knee 03/16/2015  . Atrophic vaginitis 01/31/2015  . Recurrent UTI 12/31/2014  . IBS (irritable bowel syndrome) 10/29/2014  . Asthma, moderate persistent 09/08/2014  . Allergic state 09/07/2014  . Colon polyp 09/07/2014  . Hemorrhoid 09/07/2014  . Constrictive tenosynovitis 03/12/2014  . H/O total knee replacement, bilateral 07/31/2013  . Angina pectoris ([place]) 11/28/2012  . Anxiety 11/28/2012  . Acid reflux 11/28/2012  . Cardiac murmur 11/28/2012  . BP (high blood pressure) 11/28/2012  . Cannot sleep 11/28/2012  . Adiposity 11/28/2012  . Arthritis, degenerative 11/28/2012  . Restless leg 11/28/2012  . Supraventricular tachycardia ([place]) 11/28/2012  . Fibromyalgia 11/28/2012  Rosalyn Gess OTR/L,CLT 07/07/2016, 4:47 PM  [facility] [address], [place], [address] Phone: [phone]   Fax:  [phone]  Name: TRIA NOGUERA MRN: [id number] Date of Birth: 1950/09/23

## 2016-07-07 NOTE — Patient Instructions (Signed)
HEP Contrast to wrist and hand  AROM for L thumb PA , RA  AROM bloced IP and MP flexion L thumb  Opposition pick up 1 cm foam block - alternate digits  AAROM for wrist ext, AROM wrist flexion but do not force RD and UD AROM   Scar massage ed on and cica scar pad wearing at nigth time    CMC neoprene to use with act that cause pain in thumb

## 2016-07-10 ENCOUNTER — Ambulatory Visit
Admission: RE | Admit: 2016-07-10 | Discharge: 2016-07-10 | Disposition: A | Discharge status: Home / Self Care | Payer: Medicare Other | Source: Ambulatory Visit | Attending: Family Medicine | Admitting: Family Medicine

## 2016-07-10 DIAGNOSIS — Z1231 Encounter for screening mammogram for malignant neoplasm of breast: Secondary | ICD-10-CM | POA: Diagnosis not present

## 2016-07-10 DIAGNOSIS — Z1239 Encounter for other screening for malignant neoplasm of breast: Secondary | ICD-10-CM

## 2016-07-11 ENCOUNTER — Other Ambulatory Visit: Payer: Self-pay | Admitting: *Deleted

## 2016-07-11 ENCOUNTER — Inpatient Hospital Stay
Admission: RE | Admit: 2016-07-11 | Discharge: 2016-07-11 | Disposition: A | Discharge status: Home / Self Care | Payer: Self-pay | Source: Ambulatory Visit | Attending: *Deleted | Admitting: *Deleted

## 2016-07-11 ENCOUNTER — Ambulatory Visit (INDEPENDENT_AMBULATORY_CARE_PROVIDER_SITE_OTHER): Payer: Medicare Other | Admitting: Orthopedic Surgery

## 2016-07-11 ENCOUNTER — Ambulatory Visit: Payer: Medicare Other | Admitting: Rheumatology

## 2016-07-11 DIAGNOSIS — Z9289 Personal history of other medical treatment: Secondary | ICD-10-CM

## 2016-07-12 ENCOUNTER — Ambulatory Visit (INDEPENDENT_AMBULATORY_CARE_PROVIDER_SITE_OTHER): Payer: Medicare Other | Admitting: Family Medicine

## 2016-07-12 ENCOUNTER — Encounter: Payer: Self-pay | Admitting: Family Medicine

## 2016-07-12 VITALS — BP 142/72 | HR 90 | Temp 97.5°F | Resp 14 | Wt 218.4 lb

## 2016-07-12 DIAGNOSIS — F331 Major depressive disorder, recurrent, moderate: Secondary | ICD-10-CM | POA: Diagnosis not present

## 2016-07-12 DIAGNOSIS — J302 Other seasonal allergic rhinitis: Secondary | ICD-10-CM | POA: Diagnosis not present

## 2016-07-12 DIAGNOSIS — J410 Simple chronic bronchitis: Secondary | ICD-10-CM

## 2016-07-12 DIAGNOSIS — E785 Hyperlipidemia, unspecified: Secondary | ICD-10-CM

## 2016-07-12 DIAGNOSIS — F3160 Bipolar disorder, current episode mixed, unspecified: Secondary | ICD-10-CM | POA: Diagnosis not present

## 2016-07-12 DIAGNOSIS — G43109 Migraine with aura, not intractable, without status migrainosus: Secondary | ICD-10-CM | POA: Diagnosis not present

## 2016-07-12 DIAGNOSIS — I209 Angina pectoris, unspecified: Secondary | ICD-10-CM

## 2016-07-12 DIAGNOSIS — I10 Essential (primary) hypertension: Secondary | ICD-10-CM | POA: Diagnosis not present

## 2016-07-12 DIAGNOSIS — F411 Generalized anxiety disorder: Secondary | ICD-10-CM

## 2016-07-12 MED ORDER — METOPROLOL TARTRATE 25 MG PO TABS: 25.0000 mg | ORAL_TABLET | Freq: Two times a day (BID) | ORAL | 1 refills | Status: DC

## 2016-07-12 MED ORDER — VENLAFAXINE HCL ER 150 MG PO CP24: 150.0000 mg | ORAL_CAPSULE | Freq: Every day | ORAL | 1 refills | Status: DC

## 2016-07-12 MED ORDER — MONTELUKAST SODIUM 10 MG PO TABS: 10.0000 mg | ORAL_TABLET | Freq: Every day | ORAL | 1 refills | Status: DC

## 2016-07-12 MED ORDER — OLMESARTAN MEDOXOMIL-HCTZ 40-12.5 MG PO TABS
1.0000 | ORAL_TABLET | Freq: Every day | ORAL | 0 refills | Status: DC
Start: 2016-07-12 — End: 2016-10-13

## 2016-07-12 NOTE — Progress Notes (Signed)
Office Visit Note  Patient: Jacqueline Lucas             Date of Birth: 1950/03/30           MRN: [id number]             PCP: [name], MD Referring: Ashok Norris, MD Visit Date: 07/18/2016 Occupation: @GUAROCC @    Subjective:  Generalized pain   History of Present Illness: VANITA CANNELL is a 66 y.o. female with history of fibromyalgia syndrome osteoarthritis. She continues to have a lot of discomfort from fibromyalgia. She states her pain level has increased lately. She states her pain level has been anywhere from 4-10. She's been having nocturnal pain in her right lower extremity. She is having some discomfort in the trochanteric bursa area. Her hands are also stiff. She continues to have some sicca symptoms for which she's been using over-the-counter products.  Activities of Daily Living:  Patient reports morning stiffness for 30 minutes.   Patient Reports nocturnal pain.  Difficulty dressing/grooming: Reports Difficulty climbing stairs: Reports Difficulty getting out of chair: Reports Difficulty using hands for taps, buttons, cutlery, and/or writing: Denies   Review of Systems  Constitutional: Positive for fatigue. Negative for night sweats, weight gain, weight loss and weakness.  HENT: Positive for mouth dryness. Negative for mouth sores, trouble swallowing, trouble swallowing and nose dryness.   Eyes: Positive for dryness. Negative for pain, redness and visual disturbance.  Respiratory: Negative for cough, shortness of breath and difficulty breathing.   Cardiovascular: Negative for chest pain, palpitations, hypertension, irregular heartbeat and swelling in legs/feet.  Gastrointestinal: Negative for blood in stool, constipation and diarrhea.  Endocrine: Negative for increased urination.  Genitourinary: Negative for vaginal dryness.  Musculoskeletal: Positive for arthralgias, joint pain, myalgias, morning stiffness and myalgias. Negative for joint swelling,  muscle weakness and muscle tenderness.  Skin: Negative for color change, rash, hair loss, skin tightness, ulcers and sensitivity to sunlight.  Allergic/Immunologic: Negative for susceptible to infections.  Neurological: Negative for dizziness, memory loss and night sweats.  Hematological: Negative for swollen glands.  Psychiatric/Behavioral: Positive for sleep disturbance. The patient is nervous/anxious.     PMFS History:  Patient Active Problem List   Diagnosis Date Noted  . Arthritis of carpometacarpal [facility]) joint of left thumb 05/12/2016  . Bilateral thumb pain 05/01/2016  . Chronic bronchitis ([place]) 04/14/2016  . Anemia 02/03/2016  . Status post bilateral total hip replacement 01/10/2016  . Sicca ([place]) 01/10/2016  . Age related osteoporosis 01/10/2016  . Hip arthritis 08/16/2015  . Microscopic hematuria 07/30/2015  . Vaginal atrophy 07/30/2015  . GAD (generalized anxiety disorder) 07/08/2015  . Moderate episode of recurrent major depressive disorder ([place]) 07/08/2015  . Migraine with aura and without status migrainosus, not intractable 07/08/2015  . Dyslipidemia 07/08/2015  . Degenerative arthritis of right knee 03/16/2015  . Atrophic vaginitis 01/31/2015  . Recurrent UTI 12/31/2014  . IBS (irritable bowel syndrome) 10/29/2014  . Asthma, moderate persistent 09/08/2014  . Allergic state 09/07/2014  . Colon polyp 09/07/2014  . Hemorrhoid 09/07/2014  . Constrictive tenosynovitis 03/12/2014  . H/O total knee replacement, bilateral 07/31/2013  . Angina pectoris ([place]) 11/28/2012  . Anxiety 11/28/2012  . Acid reflux 11/28/2012  . Cardiac murmur 11/28/2012  . BP (high blood pressure) 11/28/2012  . Cannot sleep 11/28/2012  . Adiposity 11/28/2012  . Arthritis, degenerative 11/28/2012  . Restless leg 11/28/2012  . Supraventricular tachycardia ([place]) 11/28/2012  . Fibromyalgia 11/28/2012    Past Medical History:  Diagnosis Date  . Allergy   . Allergy to adhesive tape     Allergy to paper tape as well  . Alzheimer disease   . Anemia   . Anginal pain ([place])   . Anxiety   . Arthritis   . Asthma   . Bronchitis   . Cataract   . Chronic nausea   . Claustrophobia   . DDD (degenerative disc disease), lumbar   . Depression   . Dry eyes    uses Restasis  . Fibromyalgia   . GERD (gastroesophageal reflux disease)   . H/O bladder infections   . Heart murmur   . Hematuria   . Hyperlipidemia   . Hypertension   . Hypothyroidism   . IBS (irritable bowel syndrome)   . Insomnia   . Lumbar neuritis   . Migraines   . Obesity   . Osteoporosis   . Palpitation   . PONV (postoperative nausea and vomiting)   . RLS (restless legs syndrome)   . Shoulder fracture, right   . Sicca ([place])   . Tachycardia   . Thyroid disease     Family History  Problem Relation Age of Onset  . Diabetes Mother   . Hypertension Mother   . Heart disease Mother   . Diabetes Father   . Cancer Father   . Hypertension Sister   . Depression Sister   . Cancer Brother   . Heart disease Brother   . Bladder Cancer Neg Hx   . Kidney disease Neg Hx    Past Surgical History:  Procedure Laterality Date  . ABDOMINAL HYSTERECTOMY     due to cancer of ovaries  . BACK SURGERY N/A 2014   x 5  . CARDIAC CATHETERIZATION     no PCI; 12/18/12 ([facility], Dr. Marcello Moores): EP study with ablation. No coronary angiography done then.  . COLONOSCOPY W/ POLYPECTOMY    . COLONOSCOPY WITH PROPOFOL N/A 02/04/2016   Procedure: COLONOSCOPY WITH PROPOFOL;  Surgeon: Robert Bellow, MD;  Location: [facility] ENDOSCOPY;  Service: Endoscopy;  Laterality: N/A;  . ESOPHAGOGASTRODUODENOSCOPY (EGD) WITH PROPOFOL N/A 02/04/2016   Procedure: ESOPHAGOGASTRODUODENOSCOPY (EGD) WITH PROPOFOL;  Surgeon: Robert Bellow, MD;  Location: [facility] ENDOSCOPY;  Service: Endoscopy;  Laterality: N/A;  . FINGER ARTHROSCOPY WITH CARPOMETACARPEL ([place]) ARTHROPLASTY Left 05/01/2016   Dr. Erlinda Hong  . GIVENS CAPSULE STUDY N/A 05/02/2016   Procedure: GIVENS  CAPSULE STUDY;  Surgeon: Manya Silvas, MD;  Location: [facility] ENDOSCOPY;  Service: Endoscopy;  Laterality: N/A;  . HEMORRHOID SURGERY    . JOINT REPLACEMENT Bilateral    knee  . REPLACEMENT TOTAL KNEE Left   . SPINE SURGERY     x 2  . tendonititis     right elbow, right wrist  . TOTAL HIP ARTHROPLASTY Left 2013  . TOTAL HIP ARTHROPLASTY Right 08/16/2015   Procedure: TOTAL HIP ARTHROPLASTY ANTERIOR APPROACH;  Surgeon: Meredith Pel, MD;  Location: [place];  Service: Orthopedics;  Laterality: Right;  . TOTAL KNEE ARTHROPLASTY Right 03/16/2015   Procedure: RIGHT TOTAL KNEE ARTHROPLASTY, PCL SACRIFICING;  Surgeon: Meredith Pel, MD;  Location: [place];  Service: Orthopedics;  Laterality: Right;   Social History   Social History Narrative  . No narrative on file     Objective: Vital Signs: BP (!) 151/61 (BP Location: Left Arm, Patient Position: Sitting, Cuff Size: Normal)   Pulse 77   Resp 16   Ht 5\' 4"  (1.626 m)   Wt 218 lb (98.9 kg)  BMI 37.42 kg/m    Physical Exam  Constitutional: She is oriented to person, place, and time. She appears well-developed and well-nourished.  HENT:  Head: Normocephalic and atraumatic.  Eyes: Conjunctivae and EOM are normal.  Neck: Normal range of motion.  Cardiovascular: Normal rate, regular rhythm, normal heart sounds and intact distal pulses.   Pulmonary/Chest: Effort normal and breath sounds normal.  Abdominal: Soft. Bowel sounds are normal.  Lymphadenopathy:    She has no cervical adenopathy.  Neurological: She is alert and oriented to person, place, and time.  Skin: Skin is warm and dry. Capillary refill takes less than 2 seconds.  Psychiatric: She has a normal mood and affect. Her behavior is normal.  Nursing note and vitals reviewed.    Musculoskeletal Exam: C-spine and thoracic spine good range of motion she has discomfort range of motion of her lumbar spine shoulder joints elbow joints wrist joints are good range of motion she  has some DIP PIP thickening and CMC thickening over bilateral hands consistent with osteoarthritis. She has tenderness on palpation over bilateral trochanteric bursa consistent with trochanteric bursitis. She is good range of motion of her hip joints. She has bilateral total knee replacement with limited extension. She has some DIP changes in her feet consistent with osteoarthritis. Fibromyalgia tender points were 18 out of 18 positive.  CDAI Exam: No CDAI exam completed.    Investigation: No additional findings. CBC Latest Ref Rng & Units 06/08/2016 12/10/2015 08/18/2015  WBC 3.4 - 10.8 x10E3/uL 6.2 6.7 9.8  Hemoglobin 11.7 - 15.5 g/dL - 9.9(L) 7.9(L)  Hematocrit 34.0 - 46.6 % 40.5 32.3(L) 25.5(L)  Platelets 150 - 379 x10E3/uL 226 357 212    CMP Latest Ref Rng & Units 06/23/2016 12/10/2015 08/17/2015  Glucose 65 - 99 mg/dL - 97 177(H)  BUN 7 - 25 mg/dL - 16 8  Creatinine 0.44 - 1.00 mg/dL 0.80 0.77 0.74  Sodium 135 - 146 mmol/L - 140 138  Potassium 3.5 - 5.3 mmol/L - 4.6 3.7  Chloride 98 - 110 mmol/L - 105 104  CO2 20 - 31 mmol/L - 25 27  Calcium 8.6 - 10.4 mg/dL - 9.2 8.7(L)  Total Protein 6.1 - 8.1 g/dL - 6.6 -  Total Bilirubin 0.2 - 1.2 mg/dL - 0.5 -  Alkaline Phos 33 - 130 U/L - 105 -  AST 10 - 35 U/L - 14 -  ALT 6 - 29 U/L - 13 -    Imaging: Mr Jeri Cos Wo Contrast  Result Date: 06/23/2016 CLINICAL DATA:  Frequent headaches with associated dizziness. Syncope. Worsening symptoms over the past 2-3 months. EXAM: MRI HEAD WITHOUT AND WITH CONTRAST TECHNIQUE: Multiplanar, multiecho pulse sequences of the brain and surrounding structures were obtained without and with intravenous contrast. CONTRAST:  16mL MULTIHANCE GADOBENATE DIMEGLUMINE 529 MG/ML IV SOLN COMPARISON:  Head CT 02/12/2005 FINDINGS: Brain: A partially empty sella is again seen. There is no evidence of acute infarct, intracranial hemorrhage, mass, midline shift, or extra-axial fluid collection. The ventricles and sulci are  within normal limits for age. Patchy T2 hyperintensities in the subcortical and deep cerebral white matter bilaterally are moderate for age. No lesions are identified in the corpus callosum, brainstem, or cerebellum. No abnormal enhancement is seen. Vascular: Major intracranial vascular flow voids are preserved. Skull and upper cervical spine: Unremarkable bone marrow signal. Sinuses/Orbits: Unremarkable orbits. Paranasal sinuses and mastoid air cells are clear. Other: None. IMPRESSION: 1. No acute intracranial abnormality or mass. 2. Moderate cerebral white matter  T2 signal changes, nonspecific though most often seen with chronic small vessel ischemia. Other potential considerations might include demyelinating disease, vasculitis, or prior infection/inflammation. Electronically Signed   By: Logan Bores M.D.   On: 06/23/2016 15:14   Mm Screening Breast Tomo Bilateral  Result Date: 07/11/2016 CLINICAL DATA:  Screening. EXAM: 2D DIGITAL SCREENING BILATERAL MAMMOGRAM WITH CAD AND ADJUNCT TOMO COMPARISON:  Previous exam(s). ACR Breast Density Category b: There are scattered areas of fibroglandular density. FINDINGS: There are no findings suspicious for malignancy. Images were processed with CAD. IMPRESSION: No mammographic evidence of malignancy. A result letter of this screening mammogram will be mailed directly to the patient. RECOMMENDATION: Screening mammogram in one year. (Code:SM-B-01Y) BI-RADS CATEGORY  1: Negative. Electronically Signed   By: Abelardo Diesel M.D.   On: 07/11/2016 13:29   Mm Outside Films Mammo  Result Date: 07/11/2016 This examination belongs to an outside facility and is stored here for comparison purposes only.  Contact the originating outside institution for any associated report or interpretation.   Speciality Comments: No specialty comments available.    Procedures:  Large Joint Inj Date/Time: 07/18/2016 3:29 PM Performed by: Bo Merino Authorized by: Bo Merino   Consent Given by:  Patient Site marked: the procedure site was marked   Timeout: prior to procedure the correct patient, procedure, and site was verified   Indications:  Pain Location:  Hip Site:  R greater trochanter Prep: patient was prepped and draped in usual sterile fashion   Needle Size:  27 G Needle Length:  1.5 inches Approach:  Lateral Ultrasound Guidance: No   Fluoroscopic Guidance: No   Arthrogram: No   Medications:  40 mg triamcinolone acetonide 40 MG/ML; 1.5 mL lidocaine 1 % Aspiration Attempted: No   Aspirate amount (mL):  0 Patient tolerance:  Patient tolerated the procedure well with no immediate complications   Allergies: Morphine; Diazepam; Lyrica [pregabalin]; Latex; Rash away  [petrolatum-zinc oxide]; Salicylates; and Tape   Assessment / Plan:     Visit Diagnoses: Fibromyalgia: She is having generalized pain and discomfort and flare of fibromyalgia.  Keratoconjunctivitis sicca : She does have sicca symptoms which are well controlled with over-the-counter medications  Status post bilateral total hip replacement: She continues to have some stiffness  Trochanteric bursitis of both hips: She had trochanteric bursitis and bilateral hip area today. Right trochanter bursa was injected with cortisone after informed consent was obtained in different treatment options were discussed per request. The procedures described above. As her blood pressure was elevated we decided not to inject her left trochanter area. She will call us in case she needs injection for the left trochanter area next week.  H/O total knee replacement, bilateral: She has limited extension which causes discomfort.  Primary osteoarthritis of first carpometacarpal joint of left hand: Joint protection and muscle strengthening discussed.  Osteopenia of multiple sites - T score -2.0 in 2015. 11 advised her to discuss her bone density with her PCP.  Her other medical problems are listed as  follows:  Recurrent UTI  Restless leg  History of gastroesophageal reflux (GERD)  History of anemia  History of hypertension  History of anxiety  History of depression  History of cardiac murmur and SVT     Orders: No orders of the defined types were placed in this encounter.  No orders of the defined types were placed in this encounter.   Face-to-face time spent with patient was 30 minutes. 50% of time was spent in counseling and  coordination of care.  Follow-Up Instructions: Return in about 6 months (around 01/18/2017) for FMS,OA, DDD,OP.   Bo Merino, MD  Note - This record has been created using Editor, commissioning.  Chart creation errors have been sought, but may not always  have been located. Such creation errors do not reflect on  the standard of medical care.

## 2016-07-12 NOTE — Progress Notes (Signed)
Name: Jacqueline Lucas   MRN: [id number]    DOB: [birth year]   Date:07/12/2016       Progress Note  Subjective  Chief Complaint  Chief Complaint  Patient presents with  . Medication Refill    3 month F/U  . Hypertension    HPI  HTN: she is taking Diovan 160/6.25  and is doing well, no recent chest pain or palpitation. She has been gradually gaining weight and bp is high again. We will adjust medications.  Bipolar disorder: she is doing well on Depakote and Effexor, denies mania, sleeping better - 6-8 hours per night.    Migraine headaches: used to go to the headache clinic, but states she was taking too many medications. Migraines was under control however got work and we referred her to neurologist at [facility]. She is taking Gabapentin but not on lopressor ( not on her bag) I was prescribing percocet prn migraine, but advised to get it from Neurologist from now on.   FMS: seeing Dr. Olegario Messier on muscle relaxer and gabapentin. She states symptoms worse when there is cold weather or rain. Aching all over, but legs are worse right now  Dyslipidemia: currently on lipitor only, off Tricor.   Anemia: she had extensive evaluation done by Dr. Fleet Contras and per patient level is back to normal.   Goiter: having dysphagia, and is contemplating surgery, she was seen by Dr. Ronnald Collum.   COPD mild: from second hand smoking, taking Dulera. She has occasional cough and wheezing, but doing well at this time  Left arthoplasty thumb: done March by Dr. Erlinda Hong and was given percocet, she brought medication with her today. Doing better and is having PT    Patient Active Problem List   Diagnosis Date Noted  . Arthritis of carpometacarpal [facility]) joint of left thumb 05/12/2016  . Bilateral thumb pain 05/01/2016  . Chronic bronchitis ([employer]) 04/14/2016  . Anemia 02/03/2016  . Status post bilateral total hip replacement 01/10/2016  . Sicca ([place]) 01/10/2016  . Age related osteoporosis 01/10/2016   . Hip arthritis 08/16/2015  . Microscopic hematuria 07/30/2015  . Vaginal atrophy 07/30/2015  . GAD (generalized anxiety disorder) 07/08/2015  . Moderate episode of recurrent major depressive disorder ([place]) 07/08/2015  . Migraine with aura and without status migrainosus, not intractable 07/08/2015  . Dyslipidemia 07/08/2015  . Degenerative arthritis of right knee 03/16/2015  . Atrophic vaginitis 01/31/2015  . Recurrent UTI 12/31/2014  . IBS (irritable bowel syndrome) 10/29/2014  . Asthma, moderate persistent 09/08/2014  . Allergic state 09/07/2014  . Colon polyp 09/07/2014  . Hemorrhoid 09/07/2014  . Constrictive tenosynovitis 03/12/2014  . H/O total knee replacement, bilateral 07/31/2013  . Angina pectoris ([place]) 11/28/2012  . Anxiety 11/28/2012  . Acid reflux 11/28/2012  . Cardiac murmur 11/28/2012  . BP (high blood pressure) 11/28/2012  . Cannot sleep 11/28/2012  . Adiposity 11/28/2012  . Arthritis, degenerative 11/28/2012  . Restless leg 11/28/2012  . Supraventricular tachycardia ([place]) 11/28/2012  . Fibromyalgia 11/28/2012    Past Surgical History:  Procedure Laterality Date  . ABDOMINAL HYSTERECTOMY     due to cancer of ovaries  . BACK SURGERY N/A 2014   x 5  . CARDIAC CATHETERIZATION     no PCI; 12/18/12 ([facility], Dr. Marcello Moores): EP study with ablation. No coronary angiography done then.  . COLONOSCOPY W/ POLYPECTOMY    . COLONOSCOPY WITH PROPOFOL N/A 02/04/2016   Procedure: COLONOSCOPY WITH PROPOFOL;  Surgeon: Robert Bellow, MD;  Location: [facility] ENDOSCOPY;  Service: Endoscopy;  Laterality: N/A;  . ESOPHAGOGASTRODUODENOSCOPY (EGD) WITH PROPOFOL N/A 02/04/2016   Procedure: ESOPHAGOGASTRODUODENOSCOPY (EGD) WITH PROPOFOL;  Surgeon: Robert Bellow, MD;  Location: [facility] ENDOSCOPY;  Service: Endoscopy;  Laterality: N/A;  . FINGER ARTHROSCOPY WITH CARPOMETACARPEL ([facility]) ARTHROPLASTY Left    left thumb  . GIVENS CAPSULE STUDY N/A 05/02/2016   Procedure: GIVENS CAPSULE STUDY;   Surgeon: Manya Silvas, MD;  Location: [facility] ENDOSCOPY;  Service: Endoscopy;  Laterality: N/A;  . HEMORRHOID SURGERY    . JOINT REPLACEMENT Bilateral    knee  . REPLACEMENT TOTAL KNEE Left   . SPINE SURGERY     x 2  . tendonititis     right elbow, right wrist  . TOTAL HIP ARTHROPLASTY Left 2013  . TOTAL HIP ARTHROPLASTY Right 08/16/2015   Procedure: TOTAL HIP ARTHROPLASTY ANTERIOR APPROACH;  Surgeon: Meredith Pel, MD;  Location: [place];  Service: Orthopedics;  Laterality: Right;  . TOTAL KNEE ARTHROPLASTY Right 03/16/2015   Procedure: RIGHT TOTAL KNEE ARTHROPLASTY, PCL SACRIFICING;  Surgeon: Meredith Pel, MD;  Location: [place];  Service: Orthopedics;  Laterality: Right;    Family History  Problem Relation Age of Onset  . Diabetes Mother   . Hypertension Mother   . Heart disease Mother   . Diabetes Father   . Cancer Father   . Hypertension Sister   . Depression Sister   . Cancer Brother   . Bladder Cancer Neg Hx   . Kidney disease Neg Hx     Social History   Social History  . Marital status: Married    Spouse name: N/A  . Number of children: N/A  . Years of education: N/A   Occupational History  . Not on file.   Social History Main Topics  . Smoking status: Never Smoker  . Smokeless tobacco: Never Used  . Alcohol use No  . Drug use: No  . Sexual activity: No   Other Topics Concern  . Not on file   Social History Narrative  . No narrative on file     Current Outpatient Prescriptions:  .  aspirin 81 MG chewable tablet, Chew 81 mg by mouth daily., Disp: , Rfl:  .  atorvastatin (LIPITOR) 40 MG tablet, Take 1 tablet (40 mg total) by mouth at bedtime., Disp: 90 tablet, Rfl: 1 .  baclofen (LIORESAL) 10 MG tablet, TAKE ONE TABLET AT 7AM AND ONE TABLET AT 2PM AS NEEDED FOR MUSCLE SPASM, Disp: 180 tablet, Rfl: 1 .  Cholecalciferol (D3-1000) 1000 units tablet, Take 1,000 Units by mouth at bedtime. , Disp: , Rfl:  .  cycloSPORINE (RESTASIS) 0.05 % ophthalmic  emulsion, Place 1 drop into both eyes 2 (two) times daily. , Disp: , Rfl:  .  diclofenac (VOLTAREN) 75 MG EC tablet, Take 1 tablet (75 mg total) by mouth 2 (two) times daily., Disp: 60 tablet, Rfl: 1 .  diclofenac sodium (VOLTAREN) 1 % GEL, Apply 3 g topically 3 (three) times daily as needed., Disp: , Rfl:  .  divalproex (DEPAKOTE) 250 MG DR tablet, Take 1 tablet (250 mg total) by mouth 2 (two) times daily., Disp: 180 tablet, Rfl: 0 .  ESTRACE VAGINAL 0.1 MG/GM vaginal cream, , Disp: , Rfl:  .  gabapentin (NEURONTIN) 300 MG capsule, Take 3 capsules (900 mg total) by mouth 3 (three) times daily., Disp: 810 capsule, Rfl: 0 .  lansoprazole (PREVACID) 30 MG capsule, Take 1 capsule (30 mg total) by mouth 2 (two) times daily before  a meal., Disp: 180 capsule, Rfl: 1 .  levalbuterol (XOPENEX) 1.25 MG/3ML nebulizer solution, Take 1.25 mg by nebulization every 4 (four) hours as needed for wheezing., Disp: 72 mL, Rfl: 4 .  levothyroxine (SYNTHROID) 88 MCG tablet, Take 88 mcg by mouth daily before breakfast. , Disp: , Rfl:  .  magnesium oxide (MAG-OX) 400 MG tablet, Take 400 mg by mouth at bedtime. Reported on 07/28/2015, Disp: , Rfl:  .  metoprolol tartrate (LOPRESSOR) 25 MG tablet, Take 1 tablet (25 mg total) by mouth 2 (two) times daily., Disp: 180 tablet, Rfl: 1 .  mometasone-formoterol (DULERA) 200-5 MCG/ACT AERO, Inhale 2 puffs into the lungs 2 (two) times daily. Reported on 07/28/2015, Disp: 3 Inhaler, Rfl: 0 .  montelukast (SINGULAIR) 10 MG tablet, Take 1 tablet (10 mg total) by mouth at bedtime., Disp: 90 tablet, Rfl: 1 .  olmesartan-hydrochlorothiazide (BENICAR HCT) 40-12.5 MG tablet, Take 1 tablet by mouth daily. In place of Diovan/HCTZ, Disp: 90 tablet, Rfl: 0 .  ondansetron (ZOFRAN) 4 MG tablet, Take 1 tablet (4 mg total) by mouth every 8 (eight) hours as needed for nausea or vomiting., Disp: 20 tablet, Rfl: 0 .  oxyCODONE-acetaminophen (PERCOCET/ROXICET) 5-325 MG tablet, Take 1 tablet by mouth every  6 (six) hours as needed., Disp: , Rfl:  .  polyethylene glycol powder (GLYCOLAX/MIRALAX) powder, 255 grams one bottle for capsule endoscopy prep, Disp: 255 g, Rfl: 0 .  Tetrahydrozoline HCl (EYE DROPS OP), Apply to eye., Disp: , Rfl:  .  venlafaxine XR (EFFEXOR-XR) 150 MG 24 hr capsule, Take 1 capsule (150 mg total) by mouth daily with breakfast., Disp: 90 capsule, Rfl: 1 .  vitamin B-12 (CYANOCOBALAMIN) 1000 MCG tablet, Take 1,000 mcg by mouth at bedtime., Disp: , Rfl:   Current Facility-Administered Medications:  .  ceFAZolin (ANCEF) 2 g in dextrose 5 % 100 mL IVPB, 2 g, Intravenous, Once, Byrnett, Forest Gleason, MD  Allergies  Allergen Reactions  . Morphine Nausea And Vomiting    Ok with nausea medication  . Diazepam Nausea And Vomiting  . Lyrica [Pregabalin] Other (See Comments)    Joint swelling  . Latex Rash  . Rash Away  [Petrolatum-Zinc Oxide] Rash and Swelling  . Salicylates Other (See Comments)    Other reaction(s): Headache  . Tape Rash    Please use paper tape     ROS  Constitutional: Negative for fever or weight change.  Respiratory: Negative for cough and shortness of breath.   Cardiovascular: Negative for chest pain or palpitations.  Gastrointestinal: Negative for abdominal pain, no bowel changes.  Musculoskeletal: Negative for gait problem or joint swelling.  Skin: Negative for rash.  Neurological: Negative for dizziness or headache.  No other specific complaints in a complete review of systems (except as listed in HPI above).  Objective  Vitals:   07/12/16 1335  BP: (!) 162/70  Pulse: 90  Resp: 14  Temp: 97.5 F (36.4 C)  TempSrc: Oral  SpO2: 95%  Weight: 218 lb 6.4 oz (99.1 kg)    Body mass index is 37.49 kg/m.  Physical Exam  Constitutional: Patient appears well-developed and well-nourished. Obese No distress.  HEENT: head atraumatic, normocephalic, pupils equal and reactive to light, thyromegaly, neck supple, throat within normal  limits Cardiovascular: Normal rate, regular rhythm and normal heart sounds.  No murmur heard. No BLE edema. Pulmonary/Chest: Effort normal and breath sounds normal. No respiratory distress. Abdominal: Soft.  There is no tenderness. Psychiatric: Patient has a normal mood and affect. behavior  is normal. Judgment and thought content normal.  Recent Results (from the past 2160 hour(s))  CBC with Differential/Platelet     Status: None   Collection Time: 06/08/16  3:22 PM  Result Value Ref Range   WBC 6.2 3.4 - 10.8 x10E3/uL   RBC 4.62 3.77 - 5.28 x10E6/uL   Hemoglobin 13.6 11.1 - 15.9 g/dL   Hematocrit 40.5 34.0 - 46.6 %   MCV 88 79 - 97 fL   MCH 29.4 26.6 - 33.0 pg   MCHC 33.6 31.5 - 35.7 g/dL   RDW 14.9 12.3 - 15.4 %   Platelets 226 150 - 379 x10E3/uL   Neutrophils 57 Not Estab. %   Lymphs 26 Not Estab. %   Monocytes 10 Not Estab. %   Eos 6 Not Estab. %   Basos 1 Not Estab. %   Neutrophils Absolute 3.6 1.4 - 7.0 x10E3/uL   Lymphocytes Absolute 1.6 0.7 - 3.1 x10E3/uL   Monocytes Absolute 0.6 0.1 - 0.9 x10E3/uL   EOS (ABSOLUTE) 0.3 0.0 - 0.4 x10E3/uL   Basophils Absolute 0.0 0.0 - 0.2 x10E3/uL   Immature Granulocytes 0 Not Estab. %   Immature Grans (Abs) 0.0 0.0 - 0.1 x10E3/uL  Iron and TIBC     Status: None   Collection Time: 06/08/16  3:22 PM  Result Value Ref Range   Total Iron Binding Capacity 308 250 - 450 ug/dL   UIBC 243 118 - 369 ug/dL   Iron 65 27 - 139 ug/dL   Iron Saturation 21 15 - 55 %  Ferritin     Status: None   Collection Time: 06/08/16  3:22 PM  Result Value Ref Range   Ferritin 33 15 - 150 ng/mL  I-STAT creatinine     Status: None   Collection Time: 06/23/16  2:21 PM  Result Value Ref Range   Creatinine, Ser 0.80 0.44 - 1.00 mg/dL      PHQ2/9: Depression screen [facility] 2/9 07/12/2016 04/14/2016 01/11/2016 12/10/2015 07/08/2015  Decreased Interest 0 0 0 0 0  Down, Depressed, Hopeless 0 0 0 0 0  PHQ - 2 Score 0 0 0 0 0     Fall Risk: Fall Risk  07/12/2016  04/14/2016 01/11/2016 12/10/2015 07/08/2015  Falls in the past year? Yes Yes Yes No No  Number falls in past yr: 2 or more 1 1 - -  Injury with Fall? No Yes Yes - -  Risk Factor Category  - - High Fall Risk - -  Risk for fall due to : - - - - -  Follow up - - Falls evaluation completed - -     Functional Status Survey: Is the patient deaf or have difficulty hearing?: No Does the patient have difficulty seeing, even when wearing glasses/contacts?: Yes Does the patient have difficulty concentrating, remembering, or making decisions?: No Does the patient have difficulty walking or climbing stairs?: No Does the patient have difficulty dressing or bathing?: No Does the patient have difficulty doing errands alone such as visiting a doctor's office or shopping?: No    Assessment & Plan   1. Essential hypertension  bp is elevated, needs to resume Lopressor, also currently only taking 160/6.25 of Diovan /HCTZ and having difficulty swallowing pills, we will change to Benicar - metoprolol tartrate (LOPRESSOR) 25 MG tablet; Take 1 tablet (25 mg total) by mouth 2 (two) times daily.  Dispense: 180 tablet; Refill: 1 - olmesartan-hydrochlorothiazide (BENICAR HCT) 40-12.5 MG tablet; Take 1 tablet by mouth daily.  In place of Diovan/HCTZ  Dispense: 90 tablet; Refill: 0  2. Migraine with aura and without status migrainosus, not intractable  Seeing neurologist and advised her to [facility] percocet from [facility]  - metoprolol tartrate (LOPRESSOR) 25 MG tablet; Take 1 tablet (25 mg total) by mouth 2 (two) times daily.  Dispense: 180 tablet; Refill: 1  3. Moderate episode of recurrent major depressive disorder (HCC)  - venlafaxine XR (EFFEXOR-XR) 150 MG 24 hr capsule; Take 1 capsule (150 mg total) by mouth daily with breakfast.  Dispense: 90 capsule; Refill: 1  4. Bipolar 1 disorder, mixed ([place])  She states she is doing well on current regiment  5. Angina pectoris ([place])  Doing well at this  time, resume beta-blocker  6. Simple chronic bronchitis (HCC)  stable  7. Dyslipidemia  Taking Atorvastatin and is doing well   8. GAD (generalized anxiety disorder)  - venlafaxine XR (EFFEXOR-XR) 150 MG 24 hr capsule; Take 1 capsule (150 mg total) by mouth daily with breakfast.  Dispense: 90 capsule; Refill: 1   9. Chronic seasonal allergic rhinitis  - montelukast (SINGULAIR) 10 MG tablet; Take 1 tablet (10 mg total) by mouth at bedtime.  Dispense: 90 tablet; Refill: 1

## 2016-07-14 ENCOUNTER — Ambulatory Visit (INDEPENDENT_AMBULATORY_CARE_PROVIDER_SITE_OTHER): Payer: Medicare Other | Admitting: Physical Medicine and Rehabilitation

## 2016-07-14 ENCOUNTER — Encounter (INDEPENDENT_AMBULATORY_CARE_PROVIDER_SITE_OTHER): Payer: Self-pay | Admitting: Physical Medicine and Rehabilitation

## 2016-07-14 DIAGNOSIS — R202 Paresthesia of skin: Secondary | ICD-10-CM

## 2016-07-14 NOTE — Progress Notes (Deleted)
Numbness, pain, and tingling in second and third fingers, sometimes fourth. Burning and stinging left palm and sometimes the right. Symptoms are worse first thing in the morning. Right hand dominant.

## 2016-07-17 ENCOUNTER — Ambulatory Visit: Payer: Medicare Other | Admitting: Occupational Therapy

## 2016-07-17 ENCOUNTER — Ambulatory Visit (INDEPENDENT_AMBULATORY_CARE_PROVIDER_SITE_OTHER): Payer: Medicare Other | Admitting: Orthopedic Surgery

## 2016-07-17 DIAGNOSIS — M25642 Stiffness of left hand, not elsewhere classified: Secondary | ICD-10-CM | POA: Diagnosis not present

## 2016-07-17 DIAGNOSIS — M25532 Pain in left wrist: Secondary | ICD-10-CM

## 2016-07-17 DIAGNOSIS — M25632 Stiffness of left wrist, not elsewhere classified: Secondary | ICD-10-CM | POA: Diagnosis not present

## 2016-07-17 DIAGNOSIS — L905 Scar conditions and fibrosis of skin: Secondary | ICD-10-CM | POA: Diagnosis not present

## 2016-07-17 DIAGNOSIS — M79642 Pain in left hand: Secondary | ICD-10-CM

## 2016-07-17 DIAGNOSIS — M6281 Muscle weakness (generalized): Secondary | ICD-10-CM

## 2016-07-17 NOTE — Patient Instructions (Addendum)
  Isometric strengthening to thumb in all planes- 10 reps each  Add to HEP  Med N glide done and add to HEP  1 lbs for wrist in all planes - needed mod A for RD and UD  Can use 16 oz hammer at home - hold close to head of hammer Ice massage done over 2nd and 3rd volar A1 pulleys     Cont with wrist splint for night time  Kinestiotape done to thumb CMC scar - 2 parallel either side - 30% pull  And one over scar midway 100% pull     CMC neoprene to use with act that cause pain in thumb

## 2016-07-17 NOTE — Therapy (Signed)
[facility] [address] [place], [address] Phone: [phone]   Fax:  [phone]  Occupational Therapy Treatment  Patient Details  Name: Jacqueline Lucas MRN: [id number] Date of Birth: [birth year] Referring Provider: Dr Erlinda Hong  Encounter Date: 07/17/2016      OT End of Session - 07/17/16 1642    Visit Number 3   Number of Visits 12   Date for OT Re-Evaluation 08/14/16   OT Start Time 1411   OT Stop Time 1513   OT Time Calculation (min) 62 min   Activity Tolerance Patient tolerated treatment well   Behavior During Therapy [facility] for tasks assessed/performed      Past Medical History:  Diagnosis Date  . Allergy   . Allergy to adhesive tape    Allergy to paper tape as well  . Alzheimer disease   . Anemia   . Anginal pain ([place])   . Anxiety   . Arthritis   . Asthma   . Bronchitis   . Cataract   . Chronic nausea   . Claustrophobia   . DDD (degenerative disc disease), lumbar   . Depression   . Dry eyes    uses Restasis  . Fibromyalgia   . GERD (gastroesophageal reflux disease)   . H/O bladder infections   . Heart murmur   . Hematuria   . Hyperlipidemia   . Hypertension   . Hypothyroidism   . IBS (irritable bowel syndrome)   . Insomnia   . Lumbar neuritis   . Migraines   . Obesity   . Osteoporosis   . Palpitation   . PONV (postoperative nausea and vomiting)   . RLS (restless legs syndrome)   . Shoulder fracture, right   . Sicca ([place])   . Tachycardia   . Thyroid disease     Past Surgical History:  Procedure Laterality Date  . ABDOMINAL HYSTERECTOMY     due to cancer of ovaries  . BACK SURGERY N/A 2014   x 5  . CARDIAC CATHETERIZATION     no PCI; 12/18/12 ([facility], Dr. Marcello Moores): EP study with ablation. No coronary angiography done then.  . COLONOSCOPY W/ POLYPECTOMY    . COLONOSCOPY WITH PROPOFOL N/A 02/04/2016   Procedure: COLONOSCOPY WITH PROPOFOL;  Surgeon: Robert Bellow, MD;   Location: [facility] ENDOSCOPY;  Service: Endoscopy;  Laterality: N/A;  . ESOPHAGOGASTRODUODENOSCOPY (EGD) WITH PROPOFOL N/A 02/04/2016   Procedure: ESOPHAGOGASTRODUODENOSCOPY (EGD) WITH PROPOFOL;  Surgeon: Robert Bellow, MD;  Location: [facility] ENDOSCOPY;  Service: Endoscopy;  Laterality: N/A;  . FINGER ARTHROSCOPY WITH CARPOMETACARPEL ([place]) ARTHROPLASTY Left 05/01/2016   Dr. Erlinda Hong  . GIVENS CAPSULE STUDY N/A 05/02/2016   Procedure: GIVENS CAPSULE STUDY;  Surgeon: Manya Silvas, MD;  Location: [facility] ENDOSCOPY;  Service: Endoscopy;  Laterality: N/A;  . HEMORRHOID SURGERY    . JOINT REPLACEMENT Bilateral    knee  . REPLACEMENT TOTAL KNEE Left   . SPINE SURGERY     x 2  . tendonititis     right elbow, right wrist  . TOTAL HIP ARTHROPLASTY Left 2013  . TOTAL HIP ARTHROPLASTY Right 08/16/2015   Procedure: TOTAL HIP ARTHROPLASTY ANTERIOR APPROACH;  Surgeon: Meredith Pel, MD;  Location: [place];  Service: Orthopedics;  Laterality: Right;  . TOTAL KNEE ARTHROPLASTY Right 03/16/2015   Procedure: RIGHT TOTAL KNEE ARTHROPLASTY, PCL SACRIFICING;  Surgeon: Meredith Pel, MD;  Location: [place];  Service: Orthopedics;  Laterality: Right;  There were no vitals filed for this visit.      Subjective Assessment - 07/17/16 1438    Subjective  Had nerve conduction test - that showed normal but I still have some numbness in in the index and middle - feels thick in the palm at the base of my knuckles- appt with Dr Erlinda Hong Friday   Patient Stated Goals I want the thumb pain better and to use my L hand and thumb without any issues    Currently in Pain? No/denies            [facility] OT Assessment - 07/17/16 0001      AROM   Left Wrist Extension 70 Degrees   Left Wrist Flexion 75 Degrees   Left Wrist Radial Deviation 15 Degrees   Left Wrist Ulnar Deviation 33 Degrees     Strength   Right Hand Grip (lbs) 55   Right Hand Lateral Pinch 15 lbs   Right Hand 3 Point Pinch 15 lbs   Left Hand Grip (lbs) 31    Left Hand Lateral Pinch 12 lbs   Left Hand 3 Point Pinch 10 lbs     Left Hand AROM   L Thumb MCP 0-60 42 Degrees   L Thumb IP 0-80 70 Degrees   L Thumb Radial ADduction/ABduction 0-55 50   L Thumb Palmar ADduction/ABduction 0-45 60      Assess AROM at wrist , digits , grip and prehension strength - see flowsheet  see flowsheet Contrast done to L hand and wrist   Soft tissue mobs  - in webspace, and thumb stretches gentle  MC spreads and CT spreads  Scar massage - mobs - one area still adhere    AAROM opposition to all digits  Isometric strengthening to thumb in all planes- 10 reps each  Add to HEP  Med N glide done and add to HEP  1 lbs for wrist in all planes - needed mod A for RD and UD  Can use 16 oz hammer at home - hold close to head of hammer Ice massage done over 2nd and 3rd volar A1 pulleys   US done - see flowsheet  Cont with wrist splint for night time  Kinestiotape done to thumb CMC scar - 2 parallel either side - 30% pull  And one over scar midway 100% pull     CMC neoprene to use with act that cause pain in thumb               OT Treatments/Exercises (OP) - 07/17/16 0001      Ultrasound   Ultrasound Location over CT volar wrist   Ultrasound Parameters 3.3MHZ ..8 intensity, 4 min and 3.3MHZ   Ultrasound Goals Pain     LUE Contrast Bath   Time 8 minutes   Comments at [facility] to decrease edema, CT symptoms ,                OT Education - 07/17/16 1642    Education provided Yes   Education Details HEP changes taping , 1 lbs weight and isometric   Person(s) Educated Patient   Methods Explanation;Demonstration;Handout;Verbal cues;Tactile cues   Comprehension Returned demonstration;Verbalized understanding;Verbal cues required          OT Short Term Goals - 07/03/16 1902      OT SHORT TERM GOAL #1   Title Pain on PRWHE improve with 20 points    Baseline At eval PRWHE pain score 32/50   Time 2  Period Weeks   Status New      OT SHORT TERM GOAL #2   Title L wrist AROM improve to [facility] to turn doorknob, wipe table, bath and dress    Baseline see flowsheet- impaired in all planes - and unable to use in these act   Time 3   Period Weeks   Status New     OT SHORT TERM GOAL #3   Title L thumb AROM improve to [facility] to grasp around glass, do buttons, turn doorknob    Baseline impaired in all planes    Time 3   Period Weeks   Status New           OT Long Term Goals - 07/03/16 1906      OT LONG TERM GOAL #1   Title Function score on PRHWE improve with at least 20 points    Baseline at eval function score 47.5/50 on PRWHE   Time 5   Period Weeks   Status New     OT LONG TERM GOAL #2   Title L grip strength increase to more than 50% compare to R to turn doorknob , carry more than 5 lbs, cut with knife and peel,    Baseline NT because of pain - appt with MD tomorrow   Time 6   Period Weeks   Status New     OT LONG TERM GOAL #3   Title L Prehension strength increase to 50% compare to R hand to cut with knife, open packages and hold plate   Baseline NT secondary increase pain - appt with MD tomorrow   Time 6   Period Weeks   Status New               Plan - 07/17/16 1643    Clinical Impression Statement Pt made great progress in ROM at thumb , wrist , pain and scar tissue - pt do have symptoms of CT - did had gall about 3 wks ago - Nerve conduction was negative - pt to cont with HEP - initiated some strengthening for wrist and thumb - pt to see MD Friday    Rehab Potential Good   OT Frequency 2x / week   OT Duration 4 weeks   OT Treatment/Interventions Self-care/ADL training;Fluidtherapy;Splinting;Patient/family education;Therapeutic exercises;Contrast Bath;Ultrasound;Scar mobilization;Passive range of motion;Manual Therapy;Parrafin;Cryotherapy   OT Home Exercise Plan see pt instruction   Consulted and Agree with Plan of Care Patient      Patient will benefit from skilled therapeutic  intervention in order to improve the following deficits and impairments:  Decreased coordination, Decreased range of motion, Impaired flexibility, Impaired sensation, Pain, Impaired UE functional use, Decreased scar mobility, Decreased knowledge of use of DME, Decreased strength  Visit Diagnosis: Pain in left hand  Pain in left wrist  Scar condition and fibrosis of skin  Stiffness of left hand, not elsewhere classified  Stiffness of left wrist, not elsewhere classified  Muscle weakness (generalized)    Problem List Patient Active Problem List   Diagnosis Date Noted  . Arthritis of carpometacarpal [facility]) joint of left thumb 05/12/2016  . Bilateral thumb pain 05/01/2016  . Chronic bronchitis ([place]) 04/14/2016  . Anemia 02/03/2016  . Status post bilateral total hip replacement 01/10/2016  . Sicca ([place]) 01/10/2016  . Age related osteoporosis 01/10/2016  . Hip arthritis 08/16/2015  . Microscopic hematuria 07/30/2015  . Vaginal atrophy 07/30/2015  . GAD (generalized anxiety disorder) 07/08/2015  . Moderate episode of recurrent major depressive  disorder ([place]) 07/08/2015  . Migraine with aura and without status migrainosus, not intractable 07/08/2015  . Dyslipidemia 07/08/2015  . Degenerative arthritis of right knee 03/16/2015  . Atrophic vaginitis 01/31/2015  . Recurrent UTI 12/31/2014  . IBS (irritable bowel syndrome) 10/29/2014  . Asthma, moderate persistent 09/08/2014  . Allergic state 09/07/2014  . Colon polyp 09/07/2014  . Hemorrhoid 09/07/2014  . Constrictive tenosynovitis 03/12/2014  . H/O total knee replacement, bilateral 07/31/2013  . Angina pectoris ([place]) 11/28/2012  . Anxiety 11/28/2012  . Acid reflux 11/28/2012  . Cardiac murmur 11/28/2012  . BP (high blood pressure) 11/28/2012  . Cannot sleep 11/28/2012  . Adiposity 11/28/2012  . Arthritis, degenerative 11/28/2012  . Restless leg 11/28/2012  . Supraventricular tachycardia ([place]) 11/28/2012  . Fibromyalgia  11/28/2012    Rosalyn Gess OTR/L,CLT 07/17/2016, 4:45 PM  [facility] [address] [place], [address] Phone: [phone]   Fax:  [phone]  Name: Jacqueline Lucas MRN: [id number] Date of Birth: February 23, 1951

## 2016-07-17 NOTE — Progress Notes (Signed)
ULANDA TACKETT - 66 y.o. female MRN [id number]  Date of birth: September 10, 1950  Office Visit Note: Visit Date: 07/14/2016 PCP: [name], MD Referred by: [name], MD  Subjective: Chief Complaint  Patient presents with  . Left Hand - Pain, Numbness   HPI: Mrs. Cribb is a 66 year old female with significant orthopedic and spine history. She is a right-hand dominant female complaining of chronic worsening severe at times numbness pain and tingling in the second and third digits and sometimes fourth digits. She denies any symptoms into the fifth digit. She reports symptoms are worse first thing in the morning. She has not had prior electrodiagnostic studies. She has been wearing a wrist splint at times without significant relief. Her main complaint really is pain into the palm and wrist.    ROS Otherwise per HPI.  Assessment & Plan: Visit Diagnoses:  1. Paresthesia of skin     Plan: No additional findings.  Impression: Essentially NORMAL electrodiagnostic study of the left upper limb.  There is no significant electrodiagnostic evidence of nerve entrapment, brachial plexopathy or cervical radiculopathy.    As you know, purely sensory or demyelinating radiculopathies and chemical radiculitis may not be detected with this particular electrodiagnostic study.  Recommendations: 1.  Follow-up with referring physician. 2.  Continue current management of symptoms. 3.  Continue use of resting splint at night-time and as needed during the day.   Meds & Orders: No orders of the defined types were placed in this encounter.   Orders Placed This Encounter  Procedures  . NCV with EMG (electromyography)    Follow-up: No Follow-up on file.   Procedures: No procedures performed  EMG & NCV Findings: Evaluation of the left median motor and the left ulnar sensory nerves showed reduced amplitude (L3.1, L10.4 V).  All remaining nerves (as indicated in the following tables) were  within normal limits.    All examined muscles (as indicated in the following table) showed no evidence of electrical instability.    Impression: Essentially NORMAL electrodiagnostic study of the left upper limb.  There is no significant electrodiagnostic evidence of nerve entrapment, brachial plexopathy or cervical radiculopathy.    As you know, purely sensory or demyelinating radiculopathies and chemical radiculitis may not be detected with this particular electrodiagnostic study.  Recommendations: 1.  Follow-up with referring physician. 2.  Continue current management of symptoms. 3.  Continue use of resting splint at night-time and as needed during the day.     Nerve Conduction Studies Anti Sensory Summary Table   Stim Site NR Peak (ms) Norm Peak (ms) P-T Amp (V) Norm P-T Amp Site1 Site2 Delta-P (ms) Dist (cm) Vel (m/s) Norm Vel (m/s)  Left Median Acr Palm Anti Sensory (2nd Digit)  33.5C  Wrist    3.1 <3.6 32.4 >10 Wrist Palm 1.5 0.0    Palm    1.6 <2.0 25.8         Left Radial Anti Sensory (Base 1st Digit)  33.2C  Wrist    1.9 <3.1 17.4  Wrist Base 1st Digit 1.9 0.0    Left Ulnar Anti Sensory (5th Digit)  33.6C  Wrist    3.0 <3.7 *10.4 >15.0 Wrist 5th Digit 3.0 14.0 47 >38   Motor Summary Table   Stim Site NR Onset (ms) Norm Onset (ms) O-P Amp (mV) Norm O-P Amp Site1 Site2 Delta-0 (ms) Dist (cm) Vel (m/s) Norm Vel (m/s)  Left Median Motor (Abd Poll Brev)  33.4C  Wrist    3.5 <  4.2 *3.1 >5 Elbow Wrist 3.8 22.0 58 >50  Elbow    7.3  2.9         Left Ulnar Motor (Abd Dig Min)  33.5C  Wrist    3.2 <4.2 4.0 >3 B Elbow Wrist 2.9 20.0 60 >53  B Elbow    6.1  7.9  A Elbow B Elbow 2.0 10.0 55 >53  A Elbow    8.1  7.7          EMG   Side Muscle Nerve Root Ins Act Fibs Psw Amp Dur Poly Recrt Int Fraser Din Comment  Left Abd Poll Brev Median C8-T1 Nml Nml Nml Nml Nml 0 Nml Nml   Left 1stDorInt Ulnar C8-T1 Nml Nml Nml Nml Nml 0 Nml Nml   Left PronatorTeres Median C6-7 Nml Nml Nml  Nml Nml 0 Nml Nml   Left Biceps Musculocut C5-6 Nml Nml Nml Nml Nml 0 Nml Nml   Left Deltoid Axillary C5-6 Nml Nml Nml Nml Nml 0 Nml Nml     Nerve Conduction Studies Anti Sensory Left/Right Comparison   Stim Site L Lat (ms) R Lat (ms) L-R Lat (ms) L Amp (V) R Amp (V) L-R Amp (%) Site1 Site2 L Vel (m/s) R Vel (m/s) L-R Vel (m/s)  Median Acr Palm Anti Sensory (2nd Digit)  33.5C  Wrist 3.1   32.4   Wrist Palm     Palm 1.6   25.8         Radial Anti Sensory (Base 1st Digit)  33.2C  Wrist 1.9   17.4   Wrist Base 1st Digit     Ulnar Anti Sensory (5th Digit)  33.6C  Wrist 3.0   *10.4   Wrist 5th Digit 47     Motor Left/Right Comparison   Stim Site L Lat (ms) R Lat (ms) L-R Lat (ms) L Amp (mV) R Amp (mV) L-R Amp (%) Site1 Site2 L Vel (m/s) R Vel (m/s) L-R Vel (m/s)  Median Motor (Abd Poll Brev)  33.4C  Wrist 3.5   *3.1   Elbow Wrist 58    Elbow 7.3   2.9         Ulnar Motor (Abd Dig Min)  33.5C  Wrist 3.2   4.0   B Elbow Wrist 69    B Elbow 6.1   7.9   A Elbow B Elbow 55    A Elbow 8.1   7.7               Clinical History: No specialty comments available.  She reports that she has never smoked. She has never used smokeless tobacco.   Recent Labs  12/10/15 1541  HGBA1C 6.1*    Objective:  VS:  HT:    WT:   BMI:     BP:   HR: bpm  TEMP: ( )  RESP:  Physical Exam  Musculoskeletal:  Inspection reveals no atrophy of the bilateral APB or FDI or hand intrinsics. There is osteoarthritic changes in well-healed surgical scar. There is no swelling, color changes, allodynia or dystrophic changes. There is 5 out of 5 strength in the bilateral wrist extension, finger abduction and long finger flexion. There is intact sensation to light touch in all dermatomal and peripheral nerve distributions. There is a negative Phalen's test bilaterally. There is a negative Hoffmann's test bilaterally.    Ortho Exam Imaging: No results found.  Past Medical/Family/Surgical/Social  History: Medications & Allergies reviewed per EMR Patient Active Problem List  Diagnosis Date Noted  . Arthritis of carpometacarpal [facility]) joint of left thumb 05/12/2016  . Bilateral thumb pain 05/01/2016  . Chronic bronchitis ([place]) 04/14/2016  . Anemia 02/03/2016  . Status post bilateral total hip replacement 01/10/2016  . Sicca ([place]) 01/10/2016  . Age related osteoporosis 01/10/2016  . Hip arthritis 08/16/2015  . Microscopic hematuria 07/30/2015  . Vaginal atrophy 07/30/2015  . GAD (generalized anxiety disorder) 07/08/2015  . Moderate episode of recurrent major depressive disorder ([place]) 07/08/2015  . Migraine with aura and without status migrainosus, not intractable 07/08/2015  . Dyslipidemia 07/08/2015  . Degenerative arthritis of right knee 03/16/2015  . Atrophic vaginitis 01/31/2015  . Recurrent UTI 12/31/2014  . IBS (irritable bowel syndrome) 10/29/2014  . Asthma, moderate persistent 09/08/2014  . Allergic state 09/07/2014  . Colon polyp 09/07/2014  . Hemorrhoid 09/07/2014  . Constrictive tenosynovitis 03/12/2014  . H/O total knee replacement, bilateral 07/31/2013  . Angina pectoris ([place]) 11/28/2012  . Anxiety 11/28/2012  . Acid reflux 11/28/2012  . Cardiac murmur 11/28/2012  . BP (high blood pressure) 11/28/2012  . Cannot sleep 11/28/2012  . Adiposity 11/28/2012  . Arthritis, degenerative 11/28/2012  . Restless leg 11/28/2012  . Supraventricular tachycardia ([place]) 11/28/2012  . Fibromyalgia 11/28/2012   Past Medical History:  Diagnosis Date  . Allergy   . Allergy to adhesive tape    Allergy to paper tape as well  . Alzheimer disease   . Anemia   . Anginal pain ([place])   . Anxiety   . Arthritis   . Asthma   . Bronchitis   . Cataract   . Chronic nausea   . Claustrophobia   . DDD (degenerative disc disease), lumbar   . Depression   . Dry eyes    uses Restasis  . Fibromyalgia   . GERD (gastroesophageal reflux disease)   . H/O bladder infections   . Heart  murmur   . Hematuria   . Hyperlipidemia   . Hypertension   . Hypothyroidism   . IBS (irritable bowel syndrome)   . Insomnia   . Lumbar neuritis   . Migraines   . Obesity   . Osteoporosis   . Palpitation   . PONV (postoperative nausea and vomiting)   . RLS (restless legs syndrome)   . Shoulder fracture, right   . Sicca ([place])   . Tachycardia   . Thyroid disease    Family History  Problem Relation Age of Onset  . Diabetes Mother   . Hypertension Mother   . Heart disease Mother   . Diabetes Father   . Cancer Father   . Hypertension Sister   . Depression Sister   . Cancer Brother   . Bladder Cancer Neg Hx   . Kidney disease Neg Hx    Past Surgical History:  Procedure Laterality Date  . ABDOMINAL HYSTERECTOMY     due to cancer of ovaries  . BACK SURGERY N/A 2014   x 5  . CARDIAC CATHETERIZATION     no PCI; 12/18/12 ([facility], Dr. Marcello Moores): EP study with ablation. No coronary angiography done then.  . COLONOSCOPY W/ POLYPECTOMY    . COLONOSCOPY WITH PROPOFOL N/A 02/04/2016   Procedure: COLONOSCOPY WITH PROPOFOL;  Surgeon: Robert Bellow, MD;  [facility]COPY;  Service: Endoscopy;  Laterality: N/A;  . ESOPHAGOGASTRODUODENOSCOPY (EGD) WITH PROPOFOL N/A 02/04/2016   Procedure: ESOPHAGOGASTRODUODENOSCOPY (EGD) WITH PROPOFOL;  Surgeon: Robert Bellow, MD;  Location: [facility] ENDOSCOPY;  Service: Endoscopy;  Laterality: N/A;  .  FINGER ARTHROSCOPY WITH CARPOMETACARPEL [facility]) ARTHROPLASTY Left 05/01/2016   Dr. Erlinda Hong  . GIVENS CAPSULE STUDY N/A 05/02/2016   Procedure: GIVENS CAPSULE STUDY;  Surgeon: Manya Silvas, MD;  Location: [facility] ENDOSCOPY;  Service: Endoscopy;  Laterality: N/A;  . HEMORRHOID SURGERY    . JOINT REPLACEMENT Bilateral    knee  . REPLACEMENT TOTAL KNEE Left   . SPINE SURGERY     x 2  . tendonititis     right elbow, right wrist  . TOTAL HIP ARTHROPLASTY Left 2013  . TOTAL HIP ARTHROPLASTY Right 08/16/2015   Procedure: TOTAL HIP ARTHROPLASTY ANTERIOR  APPROACH;  Surgeon: Meredith Pel, MD;  Location: [place];  Service: Orthopedics;  Laterality: Right;  . TOTAL KNEE ARTHROPLASTY Right 03/16/2015   Procedure: RIGHT TOTAL KNEE ARTHROPLASTY, PCL SACRIFICING;  Surgeon: Meredith Pel, MD;  Location: [place];  Service: Orthopedics;  Laterality: Right;   Social History   Occupational History  . Not on file.   Social History Main Topics  . Smoking status: Never Smoker  . Smokeless tobacco: Never Used  . Alcohol use No  . Drug use: No  . Sexual activity: No

## 2016-07-17 NOTE — Procedures (Signed)
EMG & NCV Findings: Evaluation of the left median motor and the left ulnar sensory nerves showed reduced amplitude (L3.1, L10.4 V).  All remaining nerves (as indicated in the following tables) were within normal limits.    All examined muscles (as indicated in the following table) showed no evidence of electrical instability.    Impression: Essentially NORMAL electrodiagnostic study of the left upper limb.  There is no significant electrodiagnostic evidence of nerve entrapment, brachial plexopathy or cervical radiculopathy.    As you know, purely sensory or demyelinating radiculopathies and chemical radiculitis may not be detected with this particular electrodiagnostic study.  Recommendations: 1.  Follow-up with referring physician. 2.  Continue current management of symptoms. 3.  Continue use of resting splint at night-time and as needed during the day.     Nerve Conduction Studies Anti Sensory Summary Table   Stim Site NR Peak (ms) Norm Peak (ms) P-T Amp (V) Norm P-T Amp Site1 Site2 Delta-P (ms) Dist (cm) Vel (m/s) Norm Vel (m/s)  Left Median Acr Palm Anti Sensory (2nd Digit)  33.5C  Wrist    3.1 <3.6 32.4 >10 Wrist Palm 1.5 0.0    Palm    1.6 <2.0 25.8         Left Radial Anti Sensory (Base 1st Digit)  33.2C  Wrist    1.9 <3.1 17.4  Wrist Base 1st Digit 1.9 0.0    Left Ulnar Anti Sensory (5th Digit)  33.6C  Wrist    3.0 <3.7 *10.4 >15.0 Wrist 5th Digit 3.0 14.0 47 >38   Motor Summary Table   Stim Site NR Onset (ms) Norm Onset (ms) O-P Amp (mV) Norm O-P Amp Site1 Site2 Delta-0 (ms) Dist (cm) Vel (m/s) Norm Vel (m/s)  Left Median Motor (Abd Poll Brev)  33.4C  Wrist    3.5 <4.2 *3.1 >5 Elbow Wrist 3.8 22.0 58 >50  Elbow    7.3  2.9         Left Ulnar Motor (Abd Dig Min)  33.5C  Wrist    3.2 <4.2 4.0 >3 B Elbow Wrist 2.9 20.0 60 >53  B Elbow    6.1  7.9  A Elbow B Elbow 2.0 10.0 55 >53  A Elbow    8.1  7.7          EMG   Side Muscle Nerve Root Ins Act Fibs Psw Amp Dur  Poly Recrt Int Fraser Din Comment  Left Abd Poll Brev Median C8-T1 Nml Nml Nml Nml Nml 0 Nml Nml   Left 1stDorInt Ulnar C8-T1 Nml Nml Nml Nml Nml 0 Nml Nml   Left PronatorTeres Median C6-7 Nml Nml Nml Nml Nml 0 Nml Nml   Left Biceps Musculocut C5-6 Nml Nml Nml Nml Nml 0 Nml Nml   Left Deltoid Axillary C5-6 Nml Nml Nml Nml Nml 0 Nml Nml     Nerve Conduction Studies Anti Sensory Left/Right Comparison   Stim Site L Lat (ms) R Lat (ms) L-R Lat (ms) L Amp (V) R Amp (V) L-R Amp (%) Site1 Site2 L Vel (m/s) R Vel (m/s) L-R Vel (m/s)  Median Acr Palm Anti Sensory (2nd Digit)  33.5C  Wrist 3.1   32.4   Wrist Palm     Palm 1.6   25.8         Radial Anti Sensory (Base 1st Digit)  33.2C  Wrist 1.9   17.4   Wrist Base 1st Digit     Ulnar Anti Sensory (5th Digit)  33.6C  Wrist 3.0   *10.4   Wrist 5th Digit 47     Motor Left/Right Comparison   Stim Site L Lat (ms) R Lat (ms) L-R Lat (ms) L Amp (mV) R Amp (mV) L-R Amp (%) Site1 Site2 L Vel (m/s) R Vel (m/s) L-R Vel (m/s)  Median Motor (Abd Poll Brev)  33.4C  Wrist 3.5   *3.1   Elbow Wrist 58    Elbow 7.3   2.9         Ulnar Motor (Abd Dig Min)  33.5C  Wrist 3.2   4.0   B Elbow Wrist 69    B Elbow 6.1   7.9   A Elbow B Elbow 55    A Elbow 8.1   7.7

## 2016-07-18 ENCOUNTER — Encounter: Payer: Self-pay | Admitting: Rheumatology

## 2016-07-18 ENCOUNTER — Ambulatory Visit (INDEPENDENT_AMBULATORY_CARE_PROVIDER_SITE_OTHER): Payer: Medicare Other | Admitting: Rheumatology

## 2016-07-18 VITALS — BP 151/61 | HR 77 | Resp 16 | Ht 64.0 in | Wt 218.0 lb

## 2016-07-18 DIAGNOSIS — H16223 Keratoconjunctivitis sicca, not specified as Sjogren's, bilateral: Secondary | ICD-10-CM | POA: Diagnosis not present

## 2016-07-18 DIAGNOSIS — M8589 Other specified disorders of bone density and structure, multiple sites: Secondary | ICD-10-CM | POA: Diagnosis not present

## 2016-07-18 DIAGNOSIS — Z96653 Presence of artificial knee joint, bilateral: Secondary | ICD-10-CM | POA: Diagnosis not present

## 2016-07-18 DIAGNOSIS — I209 Angina pectoris, unspecified: Secondary | ICD-10-CM

## 2016-07-18 DIAGNOSIS — M7061 Trochanteric bursitis, right hip: Secondary | ICD-10-CM | POA: Diagnosis not present

## 2016-07-18 DIAGNOSIS — Z8659 Personal history of other mental and behavioral disorders: Secondary | ICD-10-CM | POA: Diagnosis not present

## 2016-07-18 DIAGNOSIS — Z8679 Personal history of other diseases of the circulatory system: Secondary | ICD-10-CM

## 2016-07-18 DIAGNOSIS — G2581 Restless legs syndrome: Secondary | ICD-10-CM

## 2016-07-18 DIAGNOSIS — M7062 Trochanteric bursitis, left hip: Secondary | ICD-10-CM

## 2016-07-18 DIAGNOSIS — N390 Urinary tract infection, site not specified: Secondary | ICD-10-CM | POA: Diagnosis not present

## 2016-07-18 DIAGNOSIS — Z862 Personal history of diseases of the blood and blood-forming organs and certain disorders involving the immune mechanism: Secondary | ICD-10-CM

## 2016-07-18 DIAGNOSIS — M1812 Unilateral primary osteoarthritis of first carpometacarpal joint, left hand: Secondary | ICD-10-CM

## 2016-07-18 DIAGNOSIS — Z8719 Personal history of other diseases of the digestive system: Secondary | ICD-10-CM | POA: Diagnosis not present

## 2016-07-18 DIAGNOSIS — M797 Fibromyalgia: Secondary | ICD-10-CM

## 2016-07-18 DIAGNOSIS — Z96643 Presence of artificial hip joint, bilateral: Secondary | ICD-10-CM

## 2016-07-18 MED ORDER — TRIAMCINOLONE ACETONIDE 40 MG/ML IJ SUSP
40.0000 mg | INTRAMUSCULAR | Status: AC | PRN
  Administered 2016-07-18: 40 mg via INTRA_ARTICULAR

## 2016-07-18 MED ORDER — LIDOCAINE HCL 1 % IJ SOLN
1.5000 mL | INTRAMUSCULAR | Status: AC | PRN
  Administered 2016-07-18: 1.5 mL

## 2016-07-18 NOTE — Patient Instructions (Signed)
Magnesium malate 250 mg  at bed time.

## 2016-07-20 ENCOUNTER — Ambulatory Visit: Payer: Medicare Other | Admitting: Occupational Therapy

## 2016-07-20 DIAGNOSIS — M25642 Stiffness of left hand, not elsewhere classified: Secondary | ICD-10-CM

## 2016-07-20 DIAGNOSIS — M6281 Muscle weakness (generalized): Secondary | ICD-10-CM

## 2016-07-20 DIAGNOSIS — M25532 Pain in left wrist: Secondary | ICD-10-CM

## 2016-07-20 DIAGNOSIS — M79642 Pain in left hand: Secondary | ICD-10-CM | POA: Diagnosis not present

## 2016-07-20 DIAGNOSIS — M25632 Stiffness of left wrist, not elsewhere classified: Secondary | ICD-10-CM

## 2016-07-20 DIAGNOSIS — L905 Scar conditions and fibrosis of skin: Secondary | ICD-10-CM | POA: Diagnosis not present

## 2016-07-20 NOTE — Patient Instructions (Addendum)
Cont with AROM and PROM same HEP   Isometric strengthening to thumb in all planes- 10 reps each  Review with pt -slow and light 10 reps - increase Sunday if no pain - 2 sets  Med N glide to cont with at home 16 oz hammer for SUp/pro, RD , UD , wrist ext and flexion - pt was doing it to long at home  Pt only to do 2 x day  10 -15 reps pain free   if no increase pain - can do 2 sets - 2 x day  Ice massage done over 2nd and 3rd volar A1 pulleys   Cont with wrist splint for night time - pt did not wear it every night

## 2016-07-20 NOTE — Therapy (Signed)
[facility] [address] [place], [address] Phone: [phone]   Fax:  [phone]  Occupational Therapy Treatment  Patient Details  Name: Jacqueline Lucas MRN: [id number] Date of Birth: February 08, 1951 Referring Provider: Dr Erlinda Hong  Encounter Date: 07/20/2016      OT End of Session - 07/20/16 2054    Visit Number 4   Number of Visits 12   Date for OT Re-Evaluation 08/14/16   OT Start Time 1640   OT Stop Time 1738   OT Time Calculation (min) 58 min   Activity Tolerance Patient tolerated treatment well   Behavior During Therapy [facility] for tasks assessed/performed      Past Medical History:  Diagnosis Date  . Allergy   . Allergy to adhesive tape    Allergy to paper tape as well  . Alzheimer disease   . Anemia   . Anginal pain ([place])   . Anxiety   . Arthritis   . Asthma   . Bronchitis   . Cataract   . Chronic nausea   . Claustrophobia   . DDD (degenerative disc disease), lumbar   . Depression   . Dry eyes    uses Restasis  . Fibromyalgia   . GERD (gastroesophageal reflux disease)   . H/O bladder infections   . Heart murmur   . Hematuria   . Hyperlipidemia   . Hypertension   . Hypothyroidism   . IBS (irritable bowel syndrome)   . Insomnia   . Lumbar neuritis   . Migraines   . Obesity   . Osteoporosis   . Palpitation   . PONV (postoperative nausea and vomiting)   . RLS (restless legs syndrome)   . Shoulder fracture, right   . Sicca ([place])   . Tachycardia   . Thyroid disease     Past Surgical History:  Procedure Laterality Date  . ABDOMINAL HYSTERECTOMY     due to cancer of ovaries  . BACK SURGERY N/A 2014   x 5  . CARDIAC CATHETERIZATION     no PCI; 12/18/12 ([facility], Dr. Marcello Moores): EP study with ablation. No coronary angiography done then.  . COLONOSCOPY W/ POLYPECTOMY    . COLONOSCOPY WITH PROPOFOL N/A 02/04/2016   Procedure: COLONOSCOPY WITH PROPOFOL;  Surgeon: Robert Bellow, MD;   Location: [facility] ENDOSCOPY;  Service: Endoscopy;  Laterality: N/A;  . ESOPHAGOGASTRODUODENOSCOPY (EGD) WITH PROPOFOL N/A 02/04/2016   Procedure: ESOPHAGOGASTRODUODENOSCOPY (EGD) WITH PROPOFOL;  Surgeon: Robert Bellow, MD;  Location: [facility] ENDOSCOPY;  Service: Endoscopy;  Laterality: N/A;  . FINGER ARTHROSCOPY WITH CARPOMETACARPEL ([place]) ARTHROPLASTY Left 05/01/2016   Dr. Erlinda Hong  . GIVENS CAPSULE STUDY N/A 05/02/2016   Procedure: GIVENS CAPSULE STUDY;  Surgeon: Manya Silvas, MD;  Location: [facility] ENDOSCOPY;  Service: Endoscopy;  Laterality: N/A;  . HEMORRHOID SURGERY    . JOINT REPLACEMENT Bilateral    knee  . REPLACEMENT TOTAL KNEE Left   . SPINE SURGERY     x 2  . tendonititis     right elbow, right wrist  . TOTAL HIP ARTHROPLASTY Left 2013  . TOTAL HIP ARTHROPLASTY Right 08/16/2015   Procedure: TOTAL HIP ARTHROPLASTY ANTERIOR APPROACH;  Surgeon: Meredith Pel, MD;  Location: [place];  Service: Orthopedics;  Laterality: Right;  . TOTAL KNEE ARTHROPLASTY Right 03/16/2015   Procedure: RIGHT TOTAL KNEE ARTHROPLASTY, PCL SACRIFICING;  Surgeon: Meredith Pel, MD;  Location: [place];  Service: Orthopedics;  Laterality: Right;  There were no vitals filed for this visit.      Subjective Assessment - 07/20/16 1644    Subjective  Seeing Dr Erlinda Hong tomorrow - still full tight , swollen feeling in index and middle finger - not really pain    Patient Stated Goals I want the thumb pain better and to use my L hand and thumb without any issues    Currently in Pain? No/denies                      OT Treatments/Exercises (OP) - 07/20/16 0001      LUE Contrast Bath   Time 8 minutes   Comments SOC to decrease edema-       Contrast done to L hand and wrist   Soft tissue mobs  - in webspace, and thumb stretches gentle  MC spreads and CT spreads  Scar massage - mobs - one area still adhere  - used Graston tool nr 2 for brushing over volar forearm scar    AAROM opposition to all  digits  Isometric strengthening to thumb in all planes- 10 reps each  Review with pt -slow and light 10 reps - increase Sunday if no pain - 2 sets  Med N glide to cont with at home 16 oz hammer for SUp/pro, RD , UD , wrist ext and flexion - pt was doing it to long at home  Pt only to do 2 x day  10 -15 reps pain free   if no increase pain - can do 2 sets - 2 x day  Ice massage done over 2nd and 3rd volar A1 pulleys   US done - see flowsheet  Cont with wrist splint for night time - pt did not wear it every night             OT Education - 07/20/16 2053    Education provided Yes   Education Details HEP changes    Person(s) Educated Patient;Spouse   Methods Explanation;Verbal cues;Tactile cues;Demonstration   Comprehension Verbalized understanding;Returned demonstration;Verbal cues required          OT Short Term Goals - 07/03/16 1902      OT SHORT TERM GOAL #1   Title Pain on PRWHE improve with 20 points    Baseline At eval PRWHE pain score 32/50   Time 2   Period Weeks   Status New     OT SHORT TERM GOAL #2   Title L wrist AROM improve to [facility] to turn doorknob, wipe table, bath and dress    Baseline see flowsheet- impaired in all planes - and unable to use in these act   Time 3   Period Weeks   Status New     OT SHORT TERM GOAL #3   Title L thumb AROM improve to [facility] to grasp around glass, do buttons, turn doorknob    Baseline impaired in all planes    Time 3   Period Weeks   Status New           OT Long Term Goals - 07/03/16 1906      OT LONG TERM GOAL #1   Title Function score on PRHWE improve with at least 20 points    Baseline at eval function score 47.5/50 on PRWHE   Time 5   Period Weeks   Status New     OT LONG TERM GOAL #2   Title L grip strength increase to more than 50%  compare to R to turn doorknob , carry more than 5 lbs, cut with knife and peel,    Baseline NT because of pain - appt with MD tomorrow   Time 6   Period Weeks    Status New     OT LONG TERM GOAL #3   Title L Prehension strength increase to 50% compare to R hand to cut with knife, open packages and hold plate   Baseline NT secondary increase pain - appt with MD tomorrow   Time 6   Period Weeks   Status New               Plan - 07/20/16 2054    Clinical Impression Statement Pt cont to show increase ROM , strength - but cont to have some discomfort in 2dn and 3rd digits , over A1pulleys of 2nd and 3rd - and CT symptoms ? but nerve conduction was negative - pt to see Surgeon tomorrow    Rehab Potential Good   OT Frequency 2x / week   OT Duration 4 weeks   OT Treatment/Interventions Self-care/ADL training;Fluidtherapy;Splinting;Patient/family education;Therapeutic exercises;Contrast Bath;Ultrasound;Scar mobilization;Passive range of motion;Manual Therapy;Parrafin;Cryotherapy   Plan how appt with surgeon went - HEP    OT Home Exercise Plan see pt instruction   Consulted and Agree with Plan of Care Patient      Patient will benefit from skilled therapeutic intervention in order to improve the following deficits and impairments:  Decreased coordination, Decreased range of motion, Impaired flexibility, Impaired sensation, Pain, Impaired UE functional use, Decreased scar mobility, Decreased knowledge of use of DME, Decreased strength  Visit Diagnosis: Pain in left hand  Pain in left wrist  Scar condition and fibrosis of skin  Stiffness of left hand, not elsewhere classified  Stiffness of left wrist, not elsewhere classified  Muscle weakness (generalized)    Problem List Patient Active Problem List   Diagnosis Date Noted  . Arthritis of carpometacarpal [facility]) joint of left thumb 05/12/2016  . Bilateral thumb pain 05/01/2016  . Chronic bronchitis ([place]) 04/14/2016  . Anemia 02/03/2016  . Status post bilateral total hip replacement 01/10/2016  . Sicca ([place]) 01/10/2016  . Age related osteoporosis 01/10/2016  . Hip arthritis 08/16/2015   . Microscopic hematuria 07/30/2015  . Vaginal atrophy 07/30/2015  . GAD (generalized anxiety disorder) 07/08/2015  . Moderate episode of recurrent major depressive disorder ([place]) 07/08/2015  . Migraine with aura and without status migrainosus, not intractable 07/08/2015  . Dyslipidemia 07/08/2015  . Degenerative arthritis of right knee 03/16/2015  . Atrophic vaginitis 01/31/2015  . Recurrent UTI 12/31/2014  . IBS (irritable bowel syndrome) 10/29/2014  . Asthma, moderate persistent 09/08/2014  . Allergic state 09/07/2014  . Colon polyp 09/07/2014  . Hemorrhoid 09/07/2014  . Constrictive tenosynovitis 03/12/2014  . H/O total knee replacement, bilateral 07/31/2013  . Angina pectoris ([place]) 11/28/2012  . Anxiety 11/28/2012  . Acid reflux 11/28/2012  . Cardiac murmur 11/28/2012  . BP (high blood pressure) 11/28/2012  . Cannot sleep 11/28/2012  . Adiposity 11/28/2012  . Arthritis, degenerative 11/28/2012  . Restless leg 11/28/2012  . Supraventricular tachycardia ([place]) 11/28/2012  . Fibromyalgia 11/28/2012    Rosalyn Gess OTR/L,CLT 07/20/2016, 8:57 PM  [facility] [address], [place], [address] Phone: [phone]   Fax:  [phone]  Name: GENEVIE ELMAN MRN: [id number] Date of Birth: 04-05-1950

## 2016-07-21 ENCOUNTER — Encounter (INDEPENDENT_AMBULATORY_CARE_PROVIDER_SITE_OTHER): Payer: Self-pay | Admitting: Orthopaedic Surgery

## 2016-07-21 ENCOUNTER — Ambulatory Visit (INDEPENDENT_AMBULATORY_CARE_PROVIDER_SITE_OTHER): Payer: Medicare Other | Admitting: Orthopaedic Surgery

## 2016-07-21 DIAGNOSIS — M79645 Pain in left finger(s): Secondary | ICD-10-CM | POA: Diagnosis not present

## 2016-07-21 DIAGNOSIS — I209 Angina pectoris, unspecified: Secondary | ICD-10-CM | POA: Diagnosis not present

## 2016-07-21 DIAGNOSIS — M79644 Pain in right finger(s): Secondary | ICD-10-CM

## 2016-07-21 NOTE — Progress Notes (Signed)
Office Visit Note   Patient: Jacqueline Lucas           Date of Birth: 06/08/1950           MRN: [id number] Visit Date: 07/21/2016              Requested by: [name], [address] [place], [place] [address] PCP: [name], MD   Assessment & Plan: Visit Diagnoses:  1. Bilateral thumb pain     Plan: Nerve conduction studies were no normal. Overall impression is osteoarthritis of her fingers. Questions encouraged and answered. Symptomatically treatment as needed. Follow-up as needed.  Follow-Up Instructions: Return if symptoms worsen or fail to improve.   Orders:  No orders of the defined types were placed in this encounter.  No orders of the defined types were placed in this encounter.     Procedures: No procedures performed   Clinical Data: No additional findings.   Subjective: Chief Complaint  Patient presents with  . Left Hand - Follow-up    Patient comes back today to review her nerve conduction studies.    Review of Systems   Objective: Vital Signs: There were no vitals taken for this visit.  Physical Exam  Ortho Exam Right hand exam shows some mild persistent numbness of her fingers. She does have some subjective swelling. Specialty Comments:  No specialty comments available.  Imaging: No results found.   PMFS History: Patient Active Problem List   Diagnosis Date Noted  . Arthritis of carpometacarpal [facility]) joint of left thumb 05/12/2016  . Bilateral thumb pain 05/01/2016  . Chronic bronchitis ([place]) 04/14/2016  . Anemia 02/03/2016  . Status post bilateral total hip replacement 01/10/2016  . Sicca ([place]) 01/10/2016  . Age related osteoporosis 01/10/2016  . Hip arthritis 08/16/2015  . Microscopic hematuria 07/30/2015  . Vaginal atrophy 07/30/2015  . GAD (generalized anxiety disorder) 07/08/2015  . Moderate episode of recurrent major depressive disorder ([place]) 07/08/2015  . Migraine with aura and without  status migrainosus, not intractable 07/08/2015  . Dyslipidemia 07/08/2015  . Degenerative arthritis of right knee 03/16/2015  . Atrophic vaginitis 01/31/2015  . Recurrent UTI 12/31/2014  . IBS (irritable bowel syndrome) 10/29/2014  . Asthma, moderate persistent 09/08/2014  . Allergic state 09/07/2014  . Colon polyp 09/07/2014  . Hemorrhoid 09/07/2014  . Constrictive tenosynovitis 03/12/2014  . H/O total knee replacement, bilateral 07/31/2013  . Angina pectoris ([place]) 11/28/2012  . Anxiety 11/28/2012  . Acid reflux 11/28/2012  . Cardiac murmur 11/28/2012  . BP (high blood pressure) 11/28/2012  . Cannot sleep 11/28/2012  . Adiposity 11/28/2012  . Arthritis, degenerative 11/28/2012  . Restless leg 11/28/2012  . Supraventricular tachycardia ([place]) 11/28/2012  . Fibromyalgia 11/28/2012   Past Medical History:  Diagnosis Date  . Allergy   . Allergy to adhesive tape    Allergy to paper tape as well  . Alzheimer disease   . Anemia   . Anginal pain ([place])   . Anxiety   . Arthritis   . Asthma   . Bronchitis   . Cataract   . Chronic nausea   . Claustrophobia   . DDD (degenerative disc disease), lumbar   . Depression   . Dry eyes    uses Restasis  . Fibromyalgia   . GERD (gastroesophageal reflux disease)   . H/O bladder infections   . Heart murmur   . Hematuria   . Hyperlipidemia   . Hypertension   . Hypothyroidism   .  IBS (irritable bowel syndrome)   . Insomnia   . Lumbar neuritis   . Migraines   . Obesity   . Osteoporosis   . Palpitation   . PONV (postoperative nausea and vomiting)   . RLS (restless legs syndrome)   . Shoulder fracture, right   . Sicca ([place])   . Tachycardia   . Thyroid disease     Family History  Problem Relation Age of Onset  . Diabetes Mother   . Hypertension Mother   . Heart disease Mother   . Diabetes Father   . Cancer Father   . Hypertension Sister   . Depression Sister   . Cancer Brother   . Heart disease Brother   . Bladder Cancer  Neg Hx   . Kidney disease Neg Hx     Past Surgical History:  Procedure Laterality Date  . ABDOMINAL HYSTERECTOMY     due to cancer of ovaries  . BACK SURGERY N/A 2014   x 5  . CARDIAC CATHETERIZATION     no PCI; 12/18/12 ([facility], Dr. Marcello Moores): EP study with ablation. No coronary angiography done then.  . COLONOSCOPY W/ POLYPECTOMY    . COLONOSCOPY WITH PROPOFOL N/A 02/04/2016   Procedure: COLONOSCOPY WITH PROPOFOL;  Surgeon: Robert Bellow, MD;  Location: [facility] ENDOSCOPY;  Service: Endoscopy;  Laterality: N/A;  . ESOPHAGOGASTRODUODENOSCOPY (EGD) WITH PROPOFOL N/A 02/04/2016   Procedure: ESOPHAGOGASTRODUODENOSCOPY (EGD) WITH PROPOFOL;  Surgeon: Robert Bellow, MD;  Location: [facility] ENDOSCOPY;  Service: Endoscopy;  Laterality: N/A;  . FINGER ARTHROSCOPY WITH CARPOMETACARPEL ([place]) ARTHROPLASTY Left 05/01/2016   Dr. Erlinda Hong  . GIVENS CAPSULE STUDY N/A 05/02/2016   Procedure: GIVENS CAPSULE STUDY;  Surgeon: Manya Silvas, MD;  [facility] ENDOSCOPY;  Service: Endoscopy;  Laterality: N/A;  . HEMORRHOID SURGERY    . JOINT REPLACEMENT Bilateral    knee  . REPLACEMENT TOTAL KNEE Left   . SPINE SURGERY     x 2  . tendonititis     right elbow, right wrist  . TOTAL HIP ARTHROPLASTY Left 2013  . TOTAL HIP ARTHROPLASTY Right 08/16/2015   Procedure: TOTAL HIP ARTHROPLASTY ANTERIOR APPROACH;  Surgeon: Meredith Pel, MD;  Location: [place];  Service: Orthopedics;  Laterality: Right;  . TOTAL KNEE ARTHROPLASTY Right 03/16/2015   Procedure: RIGHT TOTAL KNEE ARTHROPLASTY, PCL SACRIFICING;  Surgeon: Meredith Pel, MD;  Location: [place];  Service: Orthopedics;  Laterality: Right;   Social History   Occupational History  . Not on file.   Social History Main Topics  . Smoking status: Never Smoker  . Smokeless tobacco: Never Used  . Alcohol use No  . Drug use: No  . Sexual activity: No

## 2016-07-25 ENCOUNTER — Ambulatory Visit (INDEPENDENT_AMBULATORY_CARE_PROVIDER_SITE_OTHER): Payer: Medicare Other | Admitting: Orthopaedic Surgery

## 2016-07-25 DIAGNOSIS — H16223 Keratoconjunctivitis sicca, not specified as Sjogren's, bilateral: Secondary | ICD-10-CM | POA: Diagnosis not present

## 2016-07-26 ENCOUNTER — Other Ambulatory Visit: Payer: Self-pay | Admitting: Family Medicine

## 2016-07-26 ENCOUNTER — Ambulatory Visit (INDEPENDENT_AMBULATORY_CARE_PROVIDER_SITE_OTHER): Payer: Medicare Other | Admitting: Orthopedic Surgery

## 2016-07-26 DIAGNOSIS — F3111 Bipolar disorder, current episode manic without psychotic features, mild: Secondary | ICD-10-CM

## 2016-07-26 NOTE — Telephone Encounter (Signed)
Patient requesting refill of Abilify to [facility].

## 2016-07-26 NOTE — Telephone Encounter (Signed)
This patient was seen on 07/12/2016 and her medications from that visit do not show Abilify as a part of her regimen. She has not been prescribed Abilify since 01/11/2016.  She is to be taking Effexor and Depakote for her Bipolar.  Please call her to clarify and let me know if this is inaccurate.  Thank you!

## 2016-07-27 ENCOUNTER — Ambulatory Visit: Payer: Medicare Other | Admitting: Occupational Therapy

## 2016-07-27 DIAGNOSIS — M6281 Muscle weakness (generalized): Secondary | ICD-10-CM

## 2016-07-27 DIAGNOSIS — M25642 Stiffness of left hand, not elsewhere classified: Secondary | ICD-10-CM | POA: Diagnosis not present

## 2016-07-27 DIAGNOSIS — M79642 Pain in left hand: Secondary | ICD-10-CM | POA: Diagnosis not present

## 2016-07-27 DIAGNOSIS — M25532 Pain in left wrist: Secondary | ICD-10-CM

## 2016-07-27 DIAGNOSIS — M25632 Stiffness of left wrist, not elsewhere classified: Secondary | ICD-10-CM

## 2016-07-27 DIAGNOSIS — L905 Scar conditions and fibrosis of skin: Secondary | ICD-10-CM

## 2016-07-27 NOTE — Therapy (Signed)
[place] [facility] [address] [place], [address] Phone: [phone]   Fax:  [phone]  Occupational Therapy Treatment  Patient Details  Name: Jacqueline Lucas MRN: [id number] Date of Birth: Dec 28, 1950 Referring Provider: Dr Erlinda Hong  Encounter Date: 07/27/2016      OT End of Session - 07/27/16 1820    Visit Number 5   Number of Visits 12   Date for OT Re-Evaluation 08/14/16   OT Start Time 1401   OT Stop Time 1447   OT Time Calculation (min) 46 min   Activity Tolerance Patient tolerated treatment well   Behavior During Therapy [facility] for tasks assessed/performed      Past Medical History:  Diagnosis Date  . Allergy   . Allergy to adhesive tape    Allergy to paper tape as well  . Alzheimer disease   . Anemia   . Anginal pain ([place])   . Anxiety   . Arthritis   . Asthma   . Bronchitis   . Cataract   . Chronic nausea   . Claustrophobia   . DDD (degenerative disc disease), lumbar   . Depression   . Dry eyes    uses Restasis  . Fibromyalgia   . GERD (gastroesophageal reflux disease)   . H/O bladder infections   . Heart murmur   . Hematuria   . Hyperlipidemia   . Hypertension   . Hypothyroidism   . IBS (irritable bowel syndrome)   . Insomnia   . Lumbar neuritis   . Migraines   . Obesity   . Osteoporosis   . Palpitation   . PONV (postoperative nausea and vomiting)   . RLS (restless legs syndrome)   . Shoulder fracture, right   . Sicca ([place])   . Tachycardia   . Thyroid disease     Past Surgical History:  Procedure Laterality Date  . ABDOMINAL HYSTERECTOMY     due to cancer of ovaries  . BACK SURGERY N/A 2014   x 5  . CARDIAC CATHETERIZATION     no PCI; 12/18/12 ([facility], Dr. Marcello Moores): EP study with ablation. No coronary angiography done then.  . COLONOSCOPY W/ POLYPECTOMY    . COLONOSCOPY WITH PROPOFOL N/A 02/04/2016   Procedure: COLONOSCOPY WITH PROPOFOL;  Surgeon: Robert Bellow, MD;   Location: [facility] ENDOSCOPY;  Service: Endoscopy;  Laterality: N/A;  . ESOPHAGOGASTRODUODENOSCOPY (EGD) WITH PROPOFOL N/A 02/04/2016   Procedure: ESOPHAGOGASTRODUODENOSCOPY (EGD) WITH PROPOFOL;  Surgeon: Robert Bellow, MD;  Location: [facility] ENDOSCOPY;  Service: Endoscopy;  Laterality: N/A;  . FINGER ARTHROSCOPY WITH CARPOMETACARPEL ([place]) ARTHROPLASTY Left 05/01/2016   Dr. Erlinda Hong  . GIVENS CAPSULE STUDY N/A 05/02/2016   Procedure: GIVENS CAPSULE STUDY;  Surgeon: Manya Silvas, MD;  Location: [facility] ENDOSCOPY;  Service: Endoscopy;  Laterality: N/A;  . HEMORRHOID SURGERY    . JOINT REPLACEMENT Bilateral    knee  . REPLACEMENT TOTAL KNEE Left   . SPINE SURGERY     x 2  . tendonititis     right elbow, right wrist  . TOTAL HIP ARTHROPLASTY Left 2013  . TOTAL HIP ARTHROPLASTY Right 08/16/2015   Procedure: TOTAL HIP ARTHROPLASTY ANTERIOR APPROACH;  Surgeon: Meredith Pel, MD;  Location: [place];  Service: Orthopedics;  Laterality: Right;  . TOTAL KNEE ARTHROPLASTY Right 03/16/2015   Procedure: RIGHT TOTAL KNEE ARTHROPLASTY, PCL SACRIFICING;  Surgeon: Meredith Pel, MD;  Location: [place];  Service: Orthopedics;  Laterality: Right;  There were no vitals filed for this visit.      Subjective Assessment - 07/27/16 1812    Subjective  Seen the Dr last week - he released me - said mostly arthritis in those fingers - index adn middle - I am using it more and with wrist splint at night time numbness in the am better    Patient Stated Goals I want the thumb pain better and to use my L hand and thumb without any issues    Currently in Pain? No/denies            [facility] OT Assessment - 07/27/16 0001      Strength   Right Hand Grip (lbs) 55   Right Hand Lateral Pinch 15 lbs   Right Hand 3 Point Pinch 15 lbs   Left Hand Grip (lbs) 48   Left Hand Lateral Pinch 13.5 lbs   Left Hand 3 Point Pinch 11 lbs                  OT Treatments/Exercises (OP) - 07/27/16 0001      Ultrasound    Ultrasound Location over CT and volar A1pulleys of 2nd and 3rd    Ultrasound Parameters 3.3MHZ, .8 inteinsity and 20 % for 5 min at end    Ultrasound Goals Pain;Other (Comment)  numbness     LUE Fluidotherapy   Number Minutes Fluidotherapy 10 Minutes   LUE Fluidotherapy Location Hand;Wrist   Comments AROM for wrist and thumb prior to manual therapy      Soft tissue mobs - in webspace, and thumb stretches gentle  MC spreads and CT spreads  Scar massage - mobs - one area still adhere  - used Graston tool nr 2 for brushing over volar forearm And tape kinesiotape to scar - 30% pull either side and across 2 pieces at 100% pull   ed pt and husband to replace as needed     Rubber band for thumb PA and RA   12 reps no pain  Add to HEP - 2 x day 12 reps  Can increase to 2 sets in few days if no increase pain  Cont with  Med N glide  And splint at wrist splint at night time - for another 2 wks  Ice massage done over 2nd and 3rd volar A1 pulleys  US done - see flowsheet             OT Education - 07/27/16 1820    Education provided Yes   Education Details HEP changes   Person(s) Educated Horticulturist, commercial;Tactile cues;Verbal cues;Handout   Comprehension Verbal cues required;Returned demonstration;Verbalized understanding          OT Short Term Goals - 07/27/16 1822      OT SHORT TERM GOAL #1   Title Pain on PRWHE improve with 20 points    Baseline At eval PRWHE pain score 32/50- improve greatly - will redo next visit   Time 2   Period Weeks   Status On-going     OT SHORT TERM GOAL #2   Title L wrist AROM improve to [facility] to turn doorknob, wipe table, bath and dress    Status Achieved     OT SHORT TERM GOAL #3   Title L thumb AROM improve to [facility] to grasp around glass, do buttons, turn doorknob    Status Achieved           OT Long Term Goals - 07/27/16  [place] #1   Title Function score on PRHWE improve with at  least 20 points    Baseline at eval function score 47.5/50 on PRWHE- redo next visit - great progress   Time 2   Period Weeks   Status On-going     OT LONG TERM GOAL #2   Title L grip strength increase to more than 50% compare to R to turn doorknob , carry more than 5 lbs, cut with knife and peel,    Baseline see flowsheet   Time 2   Status On-going     OT LONG TERM GOAL #3   Title L Prehension strength increase to 50% compare to R hand to cut with knife, open packages and hold plate   Baseline see flowsheet   Time 2   Period Weeks   Status On-going               Plan - 07/27/16 1820    Clinical Impression Statement Pt making great progress in ROM , strength in L hand - increase use and no pain - upgrade HEP and pt to cont contrast , med N glide, wrist splint for night time to seeif numbness cont to improve    Occupational performance deficits (Please refer to evaluation for details): ADL's;IADL's;Rest and Sleep;Play;Leisure   OT Treatment/Interventions Self-care/ADL training;Fluidtherapy;Splinting;Patient/family education;Therapeutic exercises;Contrast Bath;Ultrasound;Scar mobilization;Passive range of motion;Manual Therapy;Parrafin;Cryotherapy   Plan assess progress with numbness and PRWHE   OT Home Exercise Plan see pt instruction   Consulted and Agree with Plan of Care Patient      Patient will benefit from skilled therapeutic intervention in order to improve the following deficits and impairments:     Visit Diagnosis: Pain in left hand  Pain in left wrist  Scar condition and fibrosis of skin  Stiffness of left hand, not elsewhere classified  Stiffness of left wrist, not elsewhere classified  Muscle weakness (generalized)    Problem List Patient Active Problem List   Diagnosis Date Noted  . Arthritis of carpometacarpal [facility]) joint of left thumb 05/12/2016  . Bilateral thumb pain 05/01/2016  . Chronic bronchitis ([place]) 04/14/2016  . Anemia 02/03/2016  .  Status post bilateral total hip replacement 01/10/2016  . Sicca ([place]) 01/10/2016  . Age related osteoporosis 01/10/2016  . Hip arthritis 08/16/2015  . Microscopic hematuria 07/30/2015  . Vaginal atrophy 07/30/2015  . GAD (generalized anxiety disorder) 07/08/2015  . Moderate episode of recurrent major depressive disorder ([place]) 07/08/2015  . Migraine with aura and without status migrainosus, not intractable 07/08/2015  . Dyslipidemia 07/08/2015  . Degenerative arthritis of right knee 03/16/2015  . Atrophic vaginitis 01/31/2015  . Recurrent UTI 12/31/2014  . IBS (irritable bowel syndrome) 10/29/2014  . Asthma, moderate persistent 09/08/2014  . Allergic state 09/07/2014  . Colon polyp 09/07/2014  . Hemorrhoid 09/07/2014  . Constrictive tenosynovitis 03/12/2014  . H/O total knee replacement, bilateral 07/31/2013  . Angina pectoris ([place]) 11/28/2012  . Anxiety 11/28/2012  . Acid reflux 11/28/2012  . Cardiac murmur 11/28/2012  . BP (high blood pressure) 11/28/2012  . Cannot sleep 11/28/2012  . Adiposity 11/28/2012  . Arthritis, degenerative 11/28/2012  . Restless leg 11/28/2012  . Supraventricular tachycardia ([place]) 11/28/2012  . Fibromyalgia 11/28/2012    Rosalyn Gess OTR/L,CLT 07/27/2016, 6:24 PM  [place] [facility] [address] [place], [address] Phone: [phone]   Fax:  [phone]  Name: Jacqueline Lucas  Jacqueline Lucas MRN: [id number] Date of Birth: 02-Mar-1950

## 2016-07-27 NOTE — Patient Instructions (Addendum)
Manual muscle testing - 5/5 in range for wrist in all planes No pain with resistance to thumb in all planes  After fluido   And tape kinesiotape to scar - 30% pull either side and across 2 pieces at 100% pull   ed pt and husband to replace as needed     Rubber band for thumb PA and RA   12 reps no pain  Add to HEP - 2 x day 12 reps  Can increase to 2 sets in few days if no increase pain  Med N glide  And splint at wrist splint at night time - for another 2 wks  Ice massage done over 2nd and 3rd volar A1 pulleys

## 2016-07-27 NOTE — Telephone Encounter (Signed)
Patient is not on this medication and D/C at pharmacy. Thanks

## 2016-08-07 DIAGNOSIS — E049 Nontoxic goiter, unspecified: Secondary | ICD-10-CM | POA: Diagnosis not present

## 2016-08-07 DIAGNOSIS — M150 Primary generalized (osteo)arthritis: Secondary | ICD-10-CM | POA: Diagnosis not present

## 2016-08-07 DIAGNOSIS — I10 Essential (primary) hypertension: Secondary | ICD-10-CM | POA: Diagnosis not present

## 2016-08-07 DIAGNOSIS — M810 Age-related osteoporosis without current pathological fracture: Secondary | ICD-10-CM | POA: Diagnosis not present

## 2016-08-07 DIAGNOSIS — E039 Hypothyroidism, unspecified: Secondary | ICD-10-CM | POA: Diagnosis not present

## 2016-08-07 DIAGNOSIS — F411 Generalized anxiety disorder: Secondary | ICD-10-CM | POA: Diagnosis not present

## 2016-08-07 DIAGNOSIS — E784 Other hyperlipidemia: Secondary | ICD-10-CM | POA: Diagnosis not present

## 2016-08-09 ENCOUNTER — Ambulatory Visit: Payer: Medicare Other | Admitting: General Surgery

## 2016-08-10 ENCOUNTER — Ambulatory Visit: Payer: Medicare Other | Attending: Orthopaedic Surgery | Admitting: Occupational Therapy

## 2016-08-10 DIAGNOSIS — M25532 Pain in left wrist: Secondary | ICD-10-CM | POA: Diagnosis not present

## 2016-08-10 DIAGNOSIS — M6281 Muscle weakness (generalized): Secondary | ICD-10-CM

## 2016-08-10 DIAGNOSIS — M79642 Pain in left hand: Secondary | ICD-10-CM

## 2016-08-10 DIAGNOSIS — M25632 Stiffness of left wrist, not elsewhere classified: Secondary | ICD-10-CM | POA: Diagnosis not present

## 2016-08-10 DIAGNOSIS — L905 Scar conditions and fibrosis of skin: Secondary | ICD-10-CM

## 2016-08-10 DIAGNOSIS — M25642 Stiffness of left hand, not elsewhere classified: Secondary | ICD-10-CM

## 2016-08-10 NOTE — Patient Instructions (Signed)
Pt to wear CMC neoprene splint during act that still cause pain to thumb - and wean gradually out  Wrist splint at night if felt numbness  Ice massage as needed over 2dn and 3rd MC volar  Scar massage to cont with

## 2016-08-10 NOTE — Therapy (Signed)
[place] [facility] [address], [place], [address] Phone: [phone]   Fax:  [phone]  Occupational Therapy Treatment/discharge   Patient Details  Name: Jacqueline Lucas MRN: [id number] Date of Birth: [birth year] Referring Provider: Dr Erlinda Hong  Encounter Date: 08/10/2016      OT End of Session - 08/10/16 1827    Visit Number 6   Number of Visits 12   Date for OT Re-Evaluation 08/14/16   OT Start Time 1430   OT Stop Time 1510   OT Time Calculation (min) 40 min   Activity Tolerance Patient tolerated treatment well   Behavior During Therapy [facility] for tasks assessed/performed      Past Medical History:  Diagnosis Date  . Allergy   . Allergy to adhesive tape    Allergy to paper tape as well  . Alzheimer disease   . Anemia   . Anginal pain ([place])   . Anxiety   . Arthritis   . Asthma   . Bronchitis   . Cataract   . Chronic nausea   . Claustrophobia   . DDD (degenerative disc disease), lumbar   . Depression   . Dry eyes    uses Restasis  . Fibromyalgia   . GERD (gastroesophageal reflux disease)   . H/O bladder infections   . Heart murmur   . Hematuria   . Hyperlipidemia   . Hypertension   . Hypothyroidism   . IBS (irritable bowel syndrome)   . Insomnia   . Lumbar neuritis   . Migraines   . Obesity   . Osteoporosis   . Palpitation   . PONV (postoperative nausea and vomiting)   . RLS (restless legs syndrome)   . Shoulder fracture, right   . Sicca ([place])   . Tachycardia   . Thyroid disease     Past Surgical History:  Procedure Laterality Date  . ABDOMINAL HYSTERECTOMY     due to cancer of ovaries  . BACK SURGERY N/A 2014   x 5  . CARDIAC CATHETERIZATION     no PCI; 12/18/12 ([facility], Dr. Marcello Moores): EP study with ablation. No coronary angiography done then.  . COLONOSCOPY W/ POLYPECTOMY    . COLONOSCOPY WITH PROPOFOL N/A 02/04/2016   Procedure: COLONOSCOPY WITH PROPOFOL;  Surgeon: Robert Bellow, MD;  Location: [facility] ENDOSCOPY;  Service: Endoscopy;  Laterality: N/A;  . ESOPHAGOGASTRODUODENOSCOPY (EGD) WITH PROPOFOL N/A 02/04/2016   Procedure: ESOPHAGOGASTRODUODENOSCOPY (EGD) WITH PROPOFOL;  Surgeon: Robert Bellow, MD;  Location: [facility] ENDOSCOPY;  Service: Endoscopy;  Laterality: N/A;  . FINGER ARTHROSCOPY WITH CARPOMETACARPEL ([place]) ARTHROPLASTY Left 05/01/2016   Dr. Erlinda Hong  . GIVENS CAPSULE STUDY N/A 05/02/2016   Procedure: GIVENS CAPSULE STUDY;  Surgeon: Manya Silvas, MD;  Location: [facility] ENDOSCOPY;  Service: Endoscopy;  Laterality: N/A;  . HEMORRHOID SURGERY    . JOINT REPLACEMENT Bilateral    knee  . REPLACEMENT TOTAL KNEE Left   . SPINE SURGERY     x 2  . tendonititis     right elbow, right wrist  . TOTAL HIP ARTHROPLASTY Left 2013  . TOTAL HIP ARTHROPLASTY Right 08/16/2015   Procedure: TOTAL HIP ARTHROPLASTY ANTERIOR APPROACH;  Surgeon: Meredith Pel, MD;  Location: [place];  Service: Orthopedics;  Laterality: Right;  . TOTAL KNEE ARTHROPLASTY Right 03/16/2015   Procedure: RIGHT TOTAL KNEE ARTHROPLASTY, PCL SACRIFICING;  Surgeon: Meredith Pel, MD;  Location: [place];  Service: Orthopedics;  Laterality: Right;  There were no vitals filed for this visit.      Subjective Assessment - 08/10/16 1825    Subjective  I am doing very well - not really pain except if I use my hand  a lot and then ache but decrease afterwards - more strength - able to use it more - numbness or pins and needles better and full or tight feeling over palm    Patient Stated Goals I want the thumb pain better and to use my L hand and thumb without any issues    Currently in Pain? No/denies            [facility] OT Assessment - 08/10/16 0001      AROM   Left Wrist Extension 76 Degrees   Left Wrist Flexion 80 Degrees     Strength   Right Hand Grip (lbs) 55   Left Hand Grip (lbs) 50   Left Hand Lateral Pinch 15 lbs   Left Hand 3 Point Pinch 11 lbs         Strength in thumb and  wrist in all planes assess - 5/5  AROM for wrist assess - see  flowsheet  scar doing great - but still one spot in middle Still pain with use - pt to  wear CMC neoprene splint during act that still cause pain to thumb - and wean gradually out  Wrist splint at night if felt numbness  Ice massage as needed over 2dn and 3rd MC volarly - not as tender anymore  Scar massage to cont with  PRWHE done with pt for pain 11/50 now and function 1.5/50                 OT Education - 08/10/16 1827    Education provided Yes   Education Details discharge instructions   Person(s) Educated Patient;Spouse   Methods Explanation;Demonstration;Verbal cues;Tactile cues;Handout   Comprehension Verbalized understanding;Returned demonstration;Verbal cues required          OT Short Term Goals - 08/10/16 1828      OT SHORT TERM GOAL #1   Title Pain on PRWHE improve with 20 points    Baseline At eval PRWHE pain score 32/50- improve greatly to 11/50   Status Achieved     OT SHORT TERM GOAL #2   Title L wrist AROM improve to [facility] to turn doorknob, wipe table, bath and dress    Status Achieved     OT SHORT TERM GOAL #3   Title L thumb AROM improve to [facility] to grasp around glass, do buttons, turn doorknob    Status Achieved           OT Long Term Goals - 08/10/16 1829      OT LONG TERM GOAL #1   Title Function score on PRHWE improve with at least 20 points    Baseline at eval function score 47.5/50 on PRWHE- and now 1.5/50   Status Achieved     OT LONG TERM GOAL #2   Title L grip strength increase to more than 50% compare to R to turn doorknob , carry more than 5 lbs, cut with knife and peel,    Status Achieved     OT LONG TERM GOAL #3   Title L Prehension strength increase to 50% compare to R hand to cut with knife, open packages and hold plate   Status Achieved               Plan - 08/10/16  1828    Clinical Impression Statement Pt made excellent progress in ROM , strength  in L hand - increase use progress on PRHWE for pain and function - pt met all goals nad discharge with HEP    Occupational performance deficits (Please refer to evaluation for details): ADL's;IADL's;Play   OT Treatment/Interventions Self-care/ADL training;Fluidtherapy;Splinting;Patient/family education;Therapeutic exercises;Contrast Bath;Ultrasound;Scar mobilization;Passive range of motion;Manual Therapy;Parrafin;Cryotherapy   Plan discharge with HEP    OT Home Exercise Plan see pt instruction   Consulted and Agree with Plan of Care Patient;Family member/caregiver      Patient will benefit from skilled therapeutic intervention in order to improve the following deficits and impairments:     Visit Diagnosis: Pain in left hand  Pain in left wrist  Scar condition and fibrosis of skin  Stiffness of left hand, not elsewhere classified  Stiffness of left wrist, not elsewhere classified  Muscle weakness (generalized)    Problem List Patient Active Problem List   Diagnosis Date Noted  . Arthritis of carpometacarpal [facility]) joint of left thumb 05/12/2016  . Bilateral thumb pain 05/01/2016  . Chronic bronchitis ([place]) 04/14/2016  . Anemia 02/03/2016  . Status post bilateral total hip replacement 01/10/2016  . Sicca ([place]) 01/10/2016  . Age related osteoporosis 01/10/2016  . Hip arthritis 08/16/2015  . Microscopic hematuria 07/30/2015  . Vaginal atrophy 07/30/2015  . GAD (generalized anxiety disorder) 07/08/2015  . Moderate episode of recurrent major depressive disorder ([place]) 07/08/2015  . Migraine with aura and without status migrainosus, not intractable 07/08/2015  . Dyslipidemia 07/08/2015  . Degenerative arthritis of right knee 03/16/2015  . Atrophic vaginitis 01/31/2015  . Recurrent UTI 12/31/2014  . IBS (irritable bowel syndrome) 10/29/2014  . Asthma, moderate persistent 09/08/2014  . Allergic state 09/07/2014  . Colon polyp 09/07/2014  . Hemorrhoid 09/07/2014  . Constrictive  tenosynovitis 03/12/2014  . H/O total knee replacement, bilateral 07/31/2013  . Angina pectoris ([place]) 11/28/2012  . Anxiety 11/28/2012  . Acid reflux 11/28/2012  . Cardiac murmur 11/28/2012  . BP (high blood pressure) 11/28/2012  . Cannot sleep 11/28/2012  . Adiposity 11/28/2012  . Arthritis, degenerative 11/28/2012  . Restless leg 11/28/2012  . Supraventricular tachycardia ([place]) 11/28/2012  . Fibromyalgia 11/28/2012    Rosalyn Gess OTR/L,CLT 08/10/2016, 6:30 PM  [place] [facility] [address], [place], [address] Phone: [phone]   Fax:  [phone]  Name: Jacqueline Lucas MRN: [id number] Date of Birth: August 19, 1950

## 2016-08-11 DIAGNOSIS — F411 Generalized anxiety disorder: Secondary | ICD-10-CM | POA: Diagnosis not present

## 2016-08-11 DIAGNOSIS — I10 Essential (primary) hypertension: Secondary | ICD-10-CM | POA: Diagnosis not present

## 2016-08-11 DIAGNOSIS — M150 Primary generalized (osteo)arthritis: Secondary | ICD-10-CM | POA: Diagnosis not present

## 2016-08-11 DIAGNOSIS — E784 Other hyperlipidemia: Secondary | ICD-10-CM | POA: Diagnosis not present

## 2016-08-11 DIAGNOSIS — E039 Hypothyroidism, unspecified: Secondary | ICD-10-CM | POA: Diagnosis not present

## 2016-08-11 DIAGNOSIS — M810 Age-related osteoporosis without current pathological fracture: Secondary | ICD-10-CM | POA: Diagnosis not present

## 2016-08-11 DIAGNOSIS — E041 Nontoxic single thyroid nodule: Secondary | ICD-10-CM | POA: Diagnosis not present

## 2016-08-11 DIAGNOSIS — E049 Nontoxic goiter, unspecified: Secondary | ICD-10-CM | POA: Diagnosis not present

## 2016-08-14 DIAGNOSIS — E039 Hypothyroidism, unspecified: Secondary | ICD-10-CM | POA: Diagnosis not present

## 2016-08-14 DIAGNOSIS — F411 Generalized anxiety disorder: Secondary | ICD-10-CM | POA: Diagnosis not present

## 2016-08-14 DIAGNOSIS — I10 Essential (primary) hypertension: Secondary | ICD-10-CM | POA: Diagnosis not present

## 2016-08-14 DIAGNOSIS — M150 Primary generalized (osteo)arthritis: Secondary | ICD-10-CM | POA: Diagnosis not present

## 2016-08-14 DIAGNOSIS — E049 Nontoxic goiter, unspecified: Secondary | ICD-10-CM | POA: Diagnosis not present

## 2016-08-14 DIAGNOSIS — E041 Nontoxic single thyroid nodule: Secondary | ICD-10-CM | POA: Diagnosis not present

## 2016-08-14 DIAGNOSIS — M810 Age-related osteoporosis without current pathological fracture: Secondary | ICD-10-CM | POA: Diagnosis not present

## 2016-08-14 DIAGNOSIS — E784 Other hyperlipidemia: Secondary | ICD-10-CM | POA: Diagnosis not present

## 2016-08-15 ENCOUNTER — Other Ambulatory Visit (INDEPENDENT_AMBULATORY_CARE_PROVIDER_SITE_OTHER): Payer: Self-pay | Admitting: Orthopedic Surgery

## 2016-08-15 NOTE — Telephone Encounter (Signed)
Ok to rf? 

## 2016-08-15 NOTE — Telephone Encounter (Signed)
y

## 2016-08-16 ENCOUNTER — Encounter: Payer: Self-pay | Admitting: Family Medicine

## 2016-08-17 ENCOUNTER — Ambulatory Visit (INDEPENDENT_AMBULATORY_CARE_PROVIDER_SITE_OTHER): Payer: Medicare Other | Admitting: Orthopedic Surgery

## 2016-08-17 ENCOUNTER — Ambulatory Visit (INDEPENDENT_AMBULATORY_CARE_PROVIDER_SITE_OTHER): Payer: Medicare Other

## 2016-08-17 ENCOUNTER — Encounter (INDEPENDENT_AMBULATORY_CARE_PROVIDER_SITE_OTHER): Payer: Self-pay | Admitting: Orthopedic Surgery

## 2016-08-17 DIAGNOSIS — M25511 Pain in right shoulder: Secondary | ICD-10-CM

## 2016-08-17 DIAGNOSIS — M25512 Pain in left shoulder: Secondary | ICD-10-CM

## 2016-08-17 DIAGNOSIS — I209 Angina pectoris, unspecified: Secondary | ICD-10-CM | POA: Diagnosis not present

## 2016-08-20 DIAGNOSIS — M25512 Pain in left shoulder: Secondary | ICD-10-CM | POA: Diagnosis not present

## 2016-08-20 DIAGNOSIS — M25511 Pain in right shoulder: Secondary | ICD-10-CM

## 2016-08-20 DIAGNOSIS — I209 Angina pectoris, unspecified: Secondary | ICD-10-CM

## 2016-08-20 MED ORDER — TRIAMCINOLONE ACETONIDE 40 MG/ML IJ SUSP
20.0000 mg | INTRAMUSCULAR | Status: AC | PRN
  Administered 2016-08-20: 20 mg via INTRA_ARTICULAR

## 2016-08-20 MED ORDER — LIDOCAINE HCL 1 % IJ SOLN
3.0000 mL | INTRAMUSCULAR | Status: AC | PRN
  Administered 2016-08-20: 3 mL

## 2016-08-20 MED ORDER — BUPIVACAINE HCL 0.25 % IJ SOLN
0.6600 mL | INTRAMUSCULAR | Status: AC | PRN
  Administered 2016-08-20: .66 mL via INTRA_ARTICULAR

## 2016-08-20 NOTE — Progress Notes (Signed)
Office Visit Note   Patient: Jacqueline Lucas           Date of Birth: 1950/05/30           MRN: [id number] Visit Date: 08/17/2016 Requested by: [name], [address] [place] [place], [place] [address] PCP: [name], MD  Subjective: Chief Complaint  Patient presents with  . Left Shoulder - Pain  . Right Shoulder - Pain    HPI: Jacqueline Lucas is a patient who underwent left thumb surgery several months ago.  Check she fell 2 months ago and has had pain in the left more than right shoulder since that time.  She is right-hand dominant.  She is unable to lay on the left-hand side.  The pain will wake her from sleep at night.  She reports pain with overhead motion.  Reports some neck pain as well as some radicular arm pain but most of her pain she localizes to the superior aspect of both shoulders.  She does have a history of fibromyalgia.  She is going to physical therapy for her thumb surgery.  She reports some new popping and grinding in the left shoulder but no real weakness              ROS: All systems reviewed are negative as they relate to the chief complaint within the history of present illness.  Patient denies  fevers or chills.   Assessment & Plan: Visit Diagnoses:  1. Acute pain of both shoulders     Plan: Impression is symptomatic acromioclavicular joint inflammation left worse than right.  She's using Norco for pain that she typically uses for her migraines.  Rotator cuff strength seems intact.  Radiographs pretty unremarkable.  Based on the location of her pain I think injections into each acromioclavicular joint under ultrasound guidance is indicated.  We'll see her back in 4-6 weeks for further imaging if she's not improved with these injections.  Follow-Up Instructions: No Follow-up on file.   Orders:  Orders Placed This Encounter  Procedures  . XR Shoulder Left  . XR Shoulder Right   No orders of the defined types were placed in this  encounter.     Procedures: Medium Joint Inj Date/Time: 08/20/2016 2:25 PM Performed by: Meredith Pel Authorized by: Meredith Pel   Consent Given by:  Patient Site marked: the procedure site was marked   Timeout: prior to procedure the correct patient, procedure, and site was verified   Indications:  Pain and diagnostic evaluation Location:  Shoulder Site:  R acromioclavicular Prep: patient was prepped and draped in usual sterile fashion   Needle Size:  25 G Needle Length:  1.5 inches Approach:  Superior Ultrasound Guided: Yes   Fluoroscopic Guidance: No   Medications:  3 mL lidocaine 1 %; 20 mg triamcinolone acetonide 40 MG/ML; 0.66 mL bupivacaine 0.25 % Aspiration Attempted: No   Patient tolerance:  Patient tolerated the procedure well with no immediate complications Medium Joint Inj Date/Time: 08/20/2016 2:26 PM Performed by: Meredith Pel Authorized by: Meredith Pel   Consent Given by:  Patient Site marked: the procedure site was marked   Timeout: prior to procedure the correct patient, procedure, and site was verified   Indications:  Pain and diagnostic evaluation Location:  Shoulder Site:  L acromioclavicular Prep: patient was prepped and draped in usual sterile fashion   Needle Size:  25 G Needle Length:  1.5 inches Approach:  Superior Ultrasound Guided: Yes  Fluoroscopic Guidance: No   Medications:  3 mL lidocaine 1 %; 20 mg triamcinolone acetonide 40 MG/ML; 0.66 mL bupivacaine 0.25 % Aspiration Attempted: No   Patient tolerance:  Patient tolerated the procedure well with no immediate complications     Clinical Data: No additional findings.  Objective: Vital Signs: There were no vitals taken for this visit.  Physical Exam:   Constitutional: Patient appears well-developed HEENT:  Head: Normocephalic Eyes:EOM are normal Neck: Normal range of motion Cardiovascular: Normal rate Pulmonary/chest: Effort normal Neurologic:  Patient is alert Skin: Skin is warm Psychiatric: Patient has normal mood and affect    Ortho Exam: Orthopedic exam demonstrates good cervical spine range of motion 5 out of 5 wrist flexion-extension biceps triceps and deltoid strength is symmetric and deltoid strength as well as symmetric infraspinatus super space and subscap strength in muscle testing.  Overhead motion is full.  No restriction of external rotation at 15 of abduction.  Re-significant acromioclavicular joint tenderness to palpation on both sides.  Also pain with crossarm adduction bilaterally left worse than right  Specialty Comments:  No specialty comments available.  Imaging: No results found.   PMFS History: Patient Active Problem List   Diagnosis Date Noted  . Arthritis of carpometacarpal [facility]) joint of left thumb 05/12/2016  . Bilateral thumb pain 05/01/2016  . Chronic bronchitis ([place]) 04/14/2016  . Anemia 02/03/2016  . Status post bilateral total hip replacement 01/10/2016  . Sicca ([place]) 01/10/2016  . Age related osteoporosis 01/10/2016  . Hip arthritis 08/16/2015  . Microscopic hematuria 07/30/2015  . Vaginal atrophy 07/30/2015  . GAD (generalized anxiety disorder) 07/08/2015  . Moderate episode of recurrent major depressive disorder ([place]) 07/08/2015  . Migraine with aura and without status migrainosus, not intractable 07/08/2015  . Dyslipidemia 07/08/2015  . Degenerative arthritis of right knee 03/16/2015  . Atrophic vaginitis 01/31/2015  . Recurrent UTI 12/31/2014  . IBS (irritable bowel syndrome) 10/29/2014  . Asthma, moderate persistent 09/08/2014  . Allergic state 09/07/2014  . Colon polyp 09/07/2014  . Hemorrhoid 09/07/2014  . Constrictive tenosynovitis 03/12/2014  . H/O total knee replacement, bilateral 07/31/2013  . Angina pectoris ([place]) 11/28/2012  . Anxiety 11/28/2012  . Acid reflux 11/28/2012  . Cardiac murmur 11/28/2012  . BP (high blood pressure) 11/28/2012  . Cannot sleep 11/28/2012   . Adiposity 11/28/2012  . Arthritis, degenerative 11/28/2012  . Restless leg 11/28/2012  . Supraventricular tachycardia ([place]) 11/28/2012  . Fibromyalgia 11/28/2012   Past Medical History:  Diagnosis Date  . Allergy   . Allergy to adhesive tape    Allergy to paper tape as well  . Alzheimer disease   . Anemia   . Anginal pain ([place])   . Anxiety   . Arthritis   . Asthma   . Bronchitis   . Cataract   . Chronic nausea   . Claustrophobia   . DDD (degenerative disc disease), lumbar   . Depression   . Dry eyes    uses Restasis  . Fibromyalgia   . GERD (gastroesophageal reflux disease)   . H/O bladder infections   . Heart murmur   . Hematuria   . Hyperlipidemia   . Hypertension   . Hypothyroidism   . IBS (irritable bowel syndrome)   . Insomnia   . Lumbar neuritis   . Migraines   . Obesity   . Osteoporosis   . Palpitation   . PONV (postoperative nausea and vomiting)   . RLS (restless legs syndrome)   . Shoulder  fracture, right   . Sicca ([place])   . Tachycardia   . Thyroid disease     Family History  Problem Relation Age of Onset  . Diabetes Mother   . Hypertension Mother   . Heart disease Mother   . Diabetes Father   . Cancer Father   . Hypertension Sister   . Depression Sister   . Cancer Brother   . Heart disease Brother   . Bladder Cancer Neg Hx   . Kidney disease Neg Hx     Past Surgical History:  Procedure Laterality Date  . ABDOMINAL HYSTERECTOMY     due to cancer of ovaries  . BACK SURGERY N/A 2014   x 5  . CARDIAC CATHETERIZATION     no PCI; 12/18/12 ([facility], Dr. Marcello Moores): EP study with ablation. No coronary angiography done then.  . COLONOSCOPY W/ POLYPECTOMY    . COLONOSCOPY WITH PROPOFOL N/A 02/04/2016   Procedure: COLONOSCOPY WITH PROPOFOL;  Surgeon: Robert Bellow, MD;  Location: [facility] ENDOSCOPY;  Service: Endoscopy;  Laterality: N/A;  . ESOPHAGOGASTRODUODENOSCOPY (EGD) WITH PROPOFOL N/A 02/04/2016   Procedure: ESOPHAGOGASTRODUODENOSCOPY (EGD)  WITH PROPOFOL;  Surgeon: Robert Bellow, MD;  Location: [facility] ENDOSCOPY;  Service: Endoscopy;  Laterality: N/A;  . FINGER ARTHROSCOPY WITH CARPOMETACARPEL ([place]) ARTHROPLASTY Left 05/01/2016   Dr. Erlinda Hong  . GIVENS CAPSULE STUDY N/A 05/02/2016   Procedure: GIVENS CAPSULE STUDY;  Surgeon: Manya Silvas, MD;  Location: [facility] ENDOSCOPY;  Service: Endoscopy;  Laterality: N/A;  . HEMORRHOID SURGERY    . JOINT REPLACEMENT Bilateral    knee  . REPLACEMENT TOTAL KNEE Left   . SPINE SURGERY     x 2  . tendonititis     right elbow, right wrist  . TOTAL HIP ARTHROPLASTY Left 2013  . TOTAL HIP ARTHROPLASTY Right 08/16/2015   Procedure: TOTAL HIP ARTHROPLASTY ANTERIOR APPROACH;  Surgeon: Meredith Pel, MD;  Location: [place];  Service: Orthopedics;  Laterality: Right;  . TOTAL KNEE ARTHROPLASTY Right 03/16/2015   Procedure: RIGHT TOTAL KNEE ARTHROPLASTY, PCL SACRIFICING;  Surgeon: Meredith Pel, MD;  Location: [place];  Service: Orthopedics;  Laterality: Right;   Social History   Occupational History  . Not on file.   Social History Main Topics  . Smoking status: Never Smoker  . Smokeless tobacco: Never Used  . Alcohol use No  . Drug use: No  . Sexual activity: No

## 2016-08-22 ENCOUNTER — Ambulatory Visit (INDEPENDENT_AMBULATORY_CARE_PROVIDER_SITE_OTHER): Payer: Medicare Other | Admitting: General Surgery

## 2016-08-22 ENCOUNTER — Encounter: Payer: Self-pay | Admitting: General Surgery

## 2016-08-22 VITALS — BP 132/78 | HR 70 | Resp 14 | Ht 62.0 in | Wt 214.0 lb

## 2016-08-22 DIAGNOSIS — D649 Anemia, unspecified: Secondary | ICD-10-CM

## 2016-08-22 NOTE — Patient Instructions (Signed)
  Patient to return as needed, 'The patient is aware to call back for any questions or concerns.

## 2016-08-22 NOTE — Progress Notes (Signed)
Dictation #1 [id number]  [id number] Patient ID: Jacqueline Lucas, female   DOB: [birth year], 66 y.o.   MRN: [id number]  Chief Complaint  Patient presents with  . Anemia    HPI Jacqueline Lucas is a 66 y.o. female here today for her results of labs done on 06/08/2016 for anemia. Patient states she is moving her bowels daily. Husband, Fritz Pickerel is present at visit.                                       HPI  Past Medical History:  Diagnosis Date  . Allergy   . Allergy to adhesive tape    Allergy to paper tape as well  . Alzheimer disease   . Anemia   . Anginal pain ([place])   . Anxiety   . Arthritis   . Asthma   . Bronchitis   . Cataract   . Chronic nausea   . Claustrophobia   . DDD (degenerative disc disease), lumbar   . Depression   . Dry eyes    uses Restasis  . Fibromyalgia   . GERD (gastroesophageal reflux disease)   . H/O bladder infections   . Heart murmur   . Hematuria   . Hyperlipidemia   . Hypertension   . Hypothyroidism   . IBS (irritable bowel syndrome)   . Insomnia   . Lumbar neuritis   . Migraines   . Obesity   . Osteoporosis   . Palpitation   . PONV (postoperative nausea and vomiting)   . RLS (restless legs syndrome)   . Shoulder fracture, right   . Sicca ([place])   . Tachycardia   . Thyroid disease     Past Surgical History:  Procedure Laterality Date  . ABDOMINAL HYSTERECTOMY     due to cancer of ovaries  . BACK SURGERY N/A 2014   x 5  . CARDIAC CATHETERIZATION     no PCI; 12/18/12 ([facility], Dr. Marcello Moores): EP study with ablation. No coronary angiography done then.  . COLONOSCOPY W/ POLYPECTOMY    . COLONOSCOPY WITH PROPOFOL N/A 02/04/2016   Procedure: COLONOSCOPY WITH PROPOFOL;  Surgeon: Robert Bellow, MD;  Location: [facility] ENDOSCOPY;  Service: Endoscopy;  Laterality: N/A;  . ESOPHAGOGASTRODUODENOSCOPY (EGD) WITH PROPOFOL N/A 02/04/2016   Procedure: ESOPHAGOGASTRODUODENOSCOPY (EGD) WITH PROPOFOL;  Surgeon: Robert Bellow, MD;  Location: [facility]  ENDOSCOPY;  Service: Endoscopy;  Laterality: N/A;  . FINGER ARTHROSCOPY WITH CARPOMETACARPEL ([place]) ARTHROPLASTY Left 05/01/2016   Dr. Erlinda Hong  . GIVENS CAPSULE STUDY N/A 05/02/2016   Procedure: GIVENS CAPSULE STUDY;  Surgeon: Manya Silvas, MD;  Location: [facility] ENDOSCOPY;  Service: Endoscopy;  Laterality: N/A;  . HEMORRHOID SURGERY    . JOINT REPLACEMENT Bilateral    knee  . REPLACEMENT TOTAL KNEE Left   . SPINE SURGERY     x 2  . tendonititis     right elbow, right wrist  . TOTAL HIP ARTHROPLASTY Left 2013  . TOTAL HIP ARTHROPLASTY Right 08/16/2015   Procedure: TOTAL HIP ARTHROPLASTY ANTERIOR APPROACH;  Surgeon: Meredith Pel, MD;  Location: [place];  Service: Orthopedics;  Laterality: Right;  . TOTAL KNEE ARTHROPLASTY Right 03/16/2015   Procedure: RIGHT TOTAL KNEE ARTHROPLASTY, PCL SACRIFICING;  Surgeon: Meredith Pel, MD;  Location: [place];  Service: Orthopedics;  Laterality: Right;    Family History  Problem Relation Age of Onset  . Diabetes Mother   .  Hypertension Mother   . Heart disease Mother   . Diabetes Father   . Cancer Father   . Hypertension Sister   . Depression Sister   . Cancer Brother   . Heart disease Brother   . Bladder Cancer Neg Hx   . Kidney disease Neg Hx     Social History Social History  Substance Use Topics  . Smoking status: Never Smoker  . Smokeless tobacco: Never Used  . Alcohol use No    Allergies  Allergen Reactions  . Morphine Nausea And Vomiting    Ok with nausea medication  . Diazepam Nausea And Vomiting  . Lyrica [Pregabalin] Other (See Comments)    Joint swelling  . Latex Rash  . Rash Away  [Petrolatum-Zinc Oxide] Rash and Swelling  . Salicylates Other (See Comments)    Other reaction(s): Headache  . Tape Rash    Please use paper tape    Current Outpatient Prescriptions  Medication Sig Dispense Refill  . amoxicillin (AMOXIL) 500 MG capsule as needed. DENTAL WORK / TOTAL JOINT REPLACEMENT    . aspirin 81 MG chewable  tablet Chew 81 mg by mouth daily.    [name] atorvastatin (LIPITOR) 40 MG tablet Take 1 tablet (40 mg total) by mouth at bedtime. 90 tablet 1  . baclofen (LIORESAL) 10 MG tablet TAKE ONE TABLET AT 7AM AND ONE TABLET AT 2PM AS NEEDED FOR MUSCLE SPASM 180 tablet 1  . Cholecalciferol (D3-1000) 1000 units tablet Take 1,000 Units by mouth at bedtime.     . cycloSPORINE (RESTASIS) 0.05 % ophthalmic emulsion Place 1 drop into both eyes 2 (two) times daily.     . diclofenac (VOLTAREN) 75 MG EC tablet TAKE 1 TABLET BY MOUTH TWICE A DAY 60 tablet 1  . diclofenac sodium (VOLTAREN) 1 % GEL Apply 3 g topically 3 (three) times daily as needed.    . divalproex (DEPAKOTE) 250 MG DR tablet Take 1 tablet (250 mg total) by mouth 2 (two) times daily. 180 tablet 0  . ESTRACE VAGINAL 0.1 MG/GM vaginal cream     . gabapentin (NEURONTIN) 300 MG capsule Take 3 capsules (900 mg total) by mouth 3 (three) times daily. 810 capsule 0  . lansoprazole (PREVACID) 30 MG capsule Take 1 capsule (30 mg total) by mouth 2 (two) times daily before a meal. 180 capsule 1  . levalbuterol (XOPENEX) 1.25 MG/3ML nebulizer solution Take 1.25 mg by nebulization every 4 (four) hours as needed for wheezing. 72 mL 4  . levothyroxine (SYNTHROID) 88 MCG tablet Take 88 mcg by mouth daily before breakfast.     . magnesium oxide (MAG-OX) 400 MG tablet Take 400 mg by mouth at bedtime. Reported on 07/28/2015    . metoprolol tartrate (LOPRESSOR) 25 MG tablet Take 1 tablet (25 mg total) by mouth 2 (two) times daily. 180 tablet 1  . mometasone-formoterol (DULERA) 200-5 MCG/ACT AERO Inhale 2 puffs into the lungs 2 (two) times daily. Reported on 07/28/2015 3 Inhaler 0  . montelukast (SINGULAIR) 10 MG tablet Take 1 tablet (10 mg total) by mouth at bedtime. 90 tablet 1  . olmesartan-hydrochlorothiazide (BENICAR HCT) 40-12.5 MG tablet Take 1 tablet by mouth daily. In place of Diovan/HCTZ 90 tablet 0  . ondansetron (ZOFRAN) 4 MG tablet Take 1 tablet (4 mg total) by mouth  every 8 (eight) hours as needed for nausea or vomiting. 20 tablet 0  . oxyCODONE-acetaminophen (PERCOCET/ROXICET) 5-325 MG tablet Take 1 tablet by mouth every 6 (six) hours as needed.    [name]  polyethylene glycol powder (GLYCOLAX/MIRALAX) powder 255 grams one bottle for capsule endoscopy prep 255 g 0  . Tetrahydrozoline HCl (EYE DROPS OP) Apply to eye.    . venlafaxine XR (EFFEXOR-XR) 150 MG 24 hr capsule Take 1 capsule (150 mg total) by mouth daily with breakfast. 90 capsule 1  . vitamin B-12 (CYANOCOBALAMIN) 1000 MCG tablet Take 1,000 mcg by mouth at bedtime.     Current Facility-Administered Medications  Medication Dose Route Frequency Provider Last Rate Last Dose  . ceFAZolin (ANCEF) 2 g in dextrose 5 % 100 mL IVPB  2 g Intravenous Once Byrnett, Forest Gleason, MD        Review of Systems Review of Systems  Constitutional: Negative.   Respiratory: Negative.   Cardiovascular: Negative.     Blood pressure 132/78, pulse 70, resp. rate 14, height 5\' 2"  (1.575 m), weight 214 lb (97.1 kg).  Physical Exam Physical Exam  Constitutional: She is oriented to person, place, and time. She appears well-developed and well-nourished.  Neurological: She is alert and oriented to person, place, and time.  Skin: Skin is warm and dry.    Data Reviewed Upper endoscopy, colonoscopy and capsule endoscopy were completed without identification of source of heme positive stools.  CBC from 06/08/2016 showed a marked rebound in the hemoglobin 13.6 with an MCV of 88.  Assessment    Resolution of previously noted microcytic anemia.  No source for GI blood loss noted.      Plan    The patient's PCP will be asked to recheck her CBC at the time of the August 2018 follow-up. If she remains stable no further intervention will be required.    Patient to return as needed. The patient is aware to call back for any questions or concerns.  The patient has been asked to check her stools with each bowel movement and  report if she notices any blood or change in color.   HPI, Physical Exam, Assessment and Plan have been scribed under the direction and in the presence of Hervey Ard, MD.  Gaspar Cola, CMA  I have completed the exam and reviewed the above documentation for accuracy and completeness.  I agree with the above.  Haematologist has been used and any errors in dictation or transcription are unintentional.  Hervey Ard, M.D., F.A.C.S.  Robert Bellow 08/22/2016, 9:19 PM

## 2016-08-28 ENCOUNTER — Telehealth (INDEPENDENT_AMBULATORY_CARE_PROVIDER_SITE_OTHER): Payer: Self-pay

## 2016-08-28 ENCOUNTER — Other Ambulatory Visit: Payer: Self-pay | Admitting: Rheumatology

## 2016-08-28 MED ORDER — GABAPENTIN 300 MG PO CAPS: 900.0000 mg | ORAL_CAPSULE | Freq: Three times a day (TID) | ORAL | 0 refills | Status: DC

## 2016-08-28 NOTE — Telephone Encounter (Signed)
Patient would like a Rx refill for Diclofenac.  Cb# is [phone].  Please advise.

## 2016-08-28 NOTE — Telephone Encounter (Signed)
Patient's husband called requesting a refill on his wife's gabapentin.  She uses [facility]  Thank you.

## 2016-08-28 NOTE — Telephone Encounter (Signed)
Okay to refill 1 please call thanks

## 2016-08-28 NOTE — Telephone Encounter (Signed)
Ok to rf? 

## 2016-08-28 NOTE — Telephone Encounter (Signed)
Last Visit: 07/18/16 Next Visit: 01/16/17  Okay to refill per Dr. Deveshwar 

## 2016-08-29 MED ORDER — DICLOFENAC SODIUM 75 MG PO TBEC: 75.0000 mg | DELAYED_RELEASE_TABLET | Freq: Two times a day (BID) | ORAL | 1 refills | Status: DC

## 2016-08-29 NOTE — Telephone Encounter (Signed)
IC advised done 

## 2016-08-29 NOTE — Addendum Note (Signed)
Addended byLaurann Montana on: 08/29/2016 08:34 AM   Modules accepted: Orders

## 2016-09-05 DIAGNOSIS — E039 Hypothyroidism, unspecified: Secondary | ICD-10-CM | POA: Diagnosis not present

## 2016-09-05 DIAGNOSIS — M150 Primary generalized (osteo)arthritis: Secondary | ICD-10-CM | POA: Diagnosis not present

## 2016-09-05 DIAGNOSIS — E048 Other specified nontoxic goiter: Secondary | ICD-10-CM | POA: Diagnosis not present

## 2016-09-05 DIAGNOSIS — M810 Age-related osteoporosis without current pathological fracture: Secondary | ICD-10-CM | POA: Diagnosis not present

## 2016-09-05 DIAGNOSIS — I10 Essential (primary) hypertension: Secondary | ICD-10-CM | POA: Diagnosis not present

## 2016-09-05 DIAGNOSIS — E784 Other hyperlipidemia: Secondary | ICD-10-CM | POA: Diagnosis not present

## 2016-09-05 DIAGNOSIS — F411 Generalized anxiety disorder: Secondary | ICD-10-CM | POA: Diagnosis not present

## 2016-09-05 DIAGNOSIS — E049 Nontoxic goiter, unspecified: Secondary | ICD-10-CM | POA: Diagnosis not present

## 2016-09-12 ENCOUNTER — Telehealth (INDEPENDENT_AMBULATORY_CARE_PROVIDER_SITE_OTHER): Payer: Self-pay

## 2016-09-12 DIAGNOSIS — E039 Hypothyroidism, unspecified: Secondary | ICD-10-CM | POA: Diagnosis not present

## 2016-09-12 DIAGNOSIS — M810 Age-related osteoporosis without current pathological fracture: Secondary | ICD-10-CM | POA: Diagnosis not present

## 2016-09-12 DIAGNOSIS — M150 Primary generalized (osteo)arthritis: Secondary | ICD-10-CM | POA: Diagnosis not present

## 2016-09-12 DIAGNOSIS — E784 Other hyperlipidemia: Secondary | ICD-10-CM | POA: Diagnosis not present

## 2016-09-12 DIAGNOSIS — I10 Essential (primary) hypertension: Secondary | ICD-10-CM | POA: Diagnosis not present

## 2016-09-12 DIAGNOSIS — E049 Nontoxic goiter, unspecified: Secondary | ICD-10-CM | POA: Diagnosis not present

## 2016-09-12 DIAGNOSIS — F411 Generalized anxiety disorder: Secondary | ICD-10-CM | POA: Diagnosis not present

## 2016-09-12 NOTE — Telephone Encounter (Signed)
Received notification from pharmacy that patient had NSAID allergy. We sent in rx for voltaren for her when she was last seen. The want to know if you want to change?  Please advise

## 2016-09-13 NOTE — Telephone Encounter (Signed)
Y pls dc voltaren rx and call hwe to see what has worked for her in past thx

## 2016-09-13 NOTE — Telephone Encounter (Signed)
IC LM for patient to call me and discuss other medications she could take. Faxed form back to pharmacy

## 2016-09-14 ENCOUNTER — Ambulatory Visit (INDEPENDENT_AMBULATORY_CARE_PROVIDER_SITE_OTHER): Payer: Medicare Other | Admitting: Orthopedic Surgery

## 2016-09-18 ENCOUNTER — Encounter (INDEPENDENT_AMBULATORY_CARE_PROVIDER_SITE_OTHER): Payer: Self-pay | Admitting: Orthopedic Surgery

## 2016-09-18 ENCOUNTER — Ambulatory Visit (INDEPENDENT_AMBULATORY_CARE_PROVIDER_SITE_OTHER): Payer: Medicare Other | Admitting: Orthopedic Surgery

## 2016-09-18 DIAGNOSIS — M25511 Pain in right shoulder: Secondary | ICD-10-CM

## 2016-09-18 DIAGNOSIS — I209 Angina pectoris, unspecified: Secondary | ICD-10-CM

## 2016-09-18 DIAGNOSIS — M25512 Pain in left shoulder: Secondary | ICD-10-CM | POA: Diagnosis not present

## 2016-09-18 DIAGNOSIS — G8929 Other chronic pain: Secondary | ICD-10-CM

## 2016-09-18 NOTE — Progress Notes (Signed)
Office Visit Note   Patient: Jacqueline Lucas           Date of Birth: March 27, 1950           MRN: [id number] Visit Date: 09/18/2016 Requested by: [name], [address] [place], [place] [address] PCP: [name], MD  Subjective: Chief Complaint  Patient presents with  . Shoulder Pain    follow up pain in bilateral shoulders    HPI: Jacqueline Lucas is a 66 year old patient with bilateral shoulder pain.  She is doing about the same.  She is really unable to lie on either side without pain.  She is able to lift her arm but repeated motion gives her symptoms.  She had junctions and bilateral acromioclavicular joints 08/17/2016.  She states he's a little bit better able to sleep on the right-hand side but it is hard for her to use her arm repeatedly.  Denies any weakness in the shoulder.              ROS: All systems reviewed are negative as they relate to the chief complaint within the history of present illness.  Patient denies  fevers or chills.   Assessment & Plan: Visit Diagnoses:  1. Chronic pain of both shoulders     Plan: Impression is bilateral shoulder pain.  Rotator cuff strength is pretty reasonable.  Acromioclavicular joint tenderness is present.  She may have labral biceps tendon pathology in her shoulders.  Plan is MRI arthrogram both shoulders due to failure of conservative treatment for her shoulder pain.  They may not have an operative problem in the shoulder.  Anti-inflammatory topical samples provided.  See her back after studies  Follow-Up Instructions: No Follow-up on file.   Orders:  Orders Placed This Encounter  Procedures  . MR Shoulder Left w/ contrast  . MR SHOULDER RIGHT W CONTRAST  . Arthrogram   No orders of the defined types were placed in this encounter.     Procedures: No procedures performed   Clinical Data: No additional findings.  Objective: Vital Signs: There were no vitals taken for this visit.  Physical Exam:    Constitutional: Patient appears well-developed HEENT:  Head: Normocephalic Eyes:EOM are normal Neck: Normal range of motion Cardiovascular: Normal rate Pulmonary/chest: Effort normal Neurologic: Patient is alert Skin: Skin is warm Psychiatric: Patient has normal mood and affect    Ortho Exam: Orthopedic exam demonstrates good cervical spine range of motion FROM the joint tenderness to direct palpation good rotator cuff strength no restriction of external rotation at 15 of abduction no other masses lymph adenopathy or skin changes noted in the shoulder region O'Brien's testing negative impingement signs positive bilaterally  Specialty Comments:  No specialty comments available.  Imaging: No results found.   PMFS History: Patient Active Problem List   Diagnosis Date Noted  . Arthritis of carpometacarpal [facility]) joint of left thumb 05/12/2016  . Bilateral thumb pain 05/01/2016  . Chronic bronchitis ([place]) 04/14/2016  . Anemia 02/03/2016  . Status post bilateral total hip replacement 01/10/2016  . Sicca ([place]) 01/10/2016  . Age related osteoporosis 01/10/2016  . Hip arthritis 08/16/2015  . Microscopic hematuria 07/30/2015  . Vaginal atrophy 07/30/2015  . GAD (generalized anxiety disorder) 07/08/2015  . Moderate episode of recurrent major depressive disorder ([place]) 07/08/2015  . Migraine with aura and without status migrainosus, not intractable 07/08/2015  . Dyslipidemia 07/08/2015  . Degenerative arthritis of right knee 03/16/2015  . Atrophic vaginitis 01/31/2015  . Recurrent  UTI 12/31/2014  . IBS (irritable bowel syndrome) 10/29/2014  . Asthma, moderate persistent 09/08/2014  . Allergic state 09/07/2014  . Colon polyp 09/07/2014  . Hemorrhoid 09/07/2014  . Constrictive tenosynovitis 03/12/2014  . H/O total knee replacement, bilateral 07/31/2013  . Angina pectoris ([place]) 11/28/2012  . Anxiety 11/28/2012  . Acid reflux 11/28/2012  . Cardiac murmur 11/28/2012  . BP (high  blood pressure) 11/28/2012  . Cannot sleep 11/28/2012  . Adiposity 11/28/2012  . Arthritis, degenerative 11/28/2012  . Restless leg 11/28/2012  . Supraventricular tachycardia ([place]) 11/28/2012  . Fibromyalgia 11/28/2012   Past Medical History:  Diagnosis Date  . Allergy   . Allergy to adhesive tape    Allergy to paper tape as well  . Alzheimer disease   . Anemia   . Anginal pain ([place])   . Anxiety   . Arthritis   . Asthma   . Bronchitis   . Cataract   . Chronic nausea   . Claustrophobia   . DDD (degenerative disc disease), lumbar   . Depression   . Dry eyes    uses Restasis  . Fibromyalgia   . GERD (gastroesophageal reflux disease)   . H/O bladder infections   . Heart murmur   . Hematuria   . Hyperlipidemia   . Hypertension   . Hypothyroidism   . IBS (irritable bowel syndrome)   . Insomnia   . Lumbar neuritis   . Migraines   . Obesity   . Osteoporosis   . Palpitation   . PONV (postoperative nausea and vomiting)   . RLS (restless legs syndrome)   . Shoulder fracture, right   . Sicca ([place])   . Tachycardia   . Thyroid disease     Family History  Problem Relation Age of Onset  . Diabetes Mother   . Hypertension Mother   . Heart disease Mother   . Diabetes Father   . Cancer Father   . Hypertension Sister   . Depression Sister   . Cancer Brother   . Heart disease Brother   . Bladder Cancer Neg Hx   . Kidney disease Neg Hx     Past Surgical History:  Procedure Laterality Date  . ABDOMINAL HYSTERECTOMY     due to cancer of ovaries  . BACK SURGERY N/A 2014   x 5  . CARDIAC CATHETERIZATION     no PCI; 12/18/12 ([facility], Dr. Marcello Moores): EP study with ablation. No coronary angiography done then.  . COLONOSCOPY W/ POLYPECTOMY    . COLONOSCOPY WITH PROPOFOL N/A 02/04/2016   Procedure: COLONOSCOPY WITH PROPOFOL;  Surgeon: Robert Bellow, MD;  Location: [facility] ENDOSCOPY;  Service: Endoscopy;  Laterality: N/A;  . ESOPHAGOGASTRODUODENOSCOPY (EGD) WITH PROPOFOL N/A  02/04/2016   Procedure: ESOPHAGOGASTRODUODENOSCOPY (EGD) WITH PROPOFOL;  Surgeon: Robert Bellow, MD;  Location: [facility] ENDOSCOPY;  Service: Endoscopy;  Laterality: N/A;  . FINGER ARTHROSCOPY WITH CARPOMETACARPEL ([place]) ARTHROPLASTY Left 05/01/2016   Dr. Erlinda Hong  . GIVENS CAPSULE STUDY N/A 05/02/2016   Procedure: GIVENS CAPSULE STUDY;  Surgeon: Manya Silvas, MD;  Location: [facility] ENDOSCOPY;  Service: Endoscopy;  Laterality: N/A;  . HEMORRHOID SURGERY    . JOINT REPLACEMENT Bilateral    knee  . REPLACEMENT TOTAL KNEE Left   . SPINE SURGERY     x 2  . tendonititis     right elbow, right wrist  . TOTAL HIP ARTHROPLASTY Left 2013  . TOTAL HIP ARTHROPLASTY Right 08/16/2015   Procedure: TOTAL HIP ARTHROPLASTY ANTERIOR APPROACH;  Surgeon: Meredith Pel, MD;  Location: [place];  Service: Orthopedics;  Laterality: Right;  . TOTAL KNEE ARTHROPLASTY Right 03/16/2015   Procedure: RIGHT TOTAL KNEE ARTHROPLASTY, PCL SACRIFICING;  Surgeon: Meredith Pel, MD;  Location: [place];  Service: Orthopedics;  Laterality: Right;   Social History   Occupational History  . Not on file.   Social History Main Topics  . Smoking status: Never Smoker  . Smokeless tobacco: Never Used  . Alcohol use No  . Drug use: No  . Sexual activity: No

## 2016-09-26 ENCOUNTER — Other Ambulatory Visit (INDEPENDENT_AMBULATORY_CARE_PROVIDER_SITE_OTHER): Payer: Self-pay | Admitting: Orthopedic Surgery

## 2016-09-26 DIAGNOSIS — G8929 Other chronic pain: Secondary | ICD-10-CM

## 2016-09-26 DIAGNOSIS — M25512 Pain in left shoulder: Secondary | ICD-10-CM

## 2016-09-26 DIAGNOSIS — M25511 Pain in right shoulder: Principal | ICD-10-CM

## 2016-10-01 ENCOUNTER — Other Ambulatory Visit: Payer: Self-pay | Admitting: Family Medicine

## 2016-10-01 DIAGNOSIS — F331 Major depressive disorder, recurrent, moderate: Secondary | ICD-10-CM

## 2016-10-01 DIAGNOSIS — F411 Generalized anxiety disorder: Secondary | ICD-10-CM

## 2016-10-02 DIAGNOSIS — E784 Other hyperlipidemia: Secondary | ICD-10-CM | POA: Diagnosis not present

## 2016-10-02 DIAGNOSIS — M150 Primary generalized (osteo)arthritis: Secondary | ICD-10-CM | POA: Diagnosis not present

## 2016-10-02 DIAGNOSIS — E039 Hypothyroidism, unspecified: Secondary | ICD-10-CM | POA: Diagnosis not present

## 2016-10-02 DIAGNOSIS — I10 Essential (primary) hypertension: Secondary | ICD-10-CM | POA: Diagnosis not present

## 2016-10-02 DIAGNOSIS — E049 Nontoxic goiter, unspecified: Secondary | ICD-10-CM | POA: Diagnosis not present

## 2016-10-02 DIAGNOSIS — M810 Age-related osteoporosis without current pathological fracture: Secondary | ICD-10-CM | POA: Diagnosis not present

## 2016-10-03 ENCOUNTER — Ambulatory Visit
Admission: RE | Admit: 2016-10-03 | Discharge: 2016-10-03 | Disposition: A | Discharge status: Home / Self Care | Payer: Medicare Other | Source: Ambulatory Visit | Attending: Orthopedic Surgery | Admitting: Orthopedic Surgery

## 2016-10-03 DIAGNOSIS — M25511 Pain in right shoulder: Principal | ICD-10-CM

## 2016-10-03 DIAGNOSIS — X58XXXA Exposure to other specified factors, initial encounter: Secondary | ICD-10-CM | POA: Insufficient documentation

## 2016-10-03 DIAGNOSIS — G8929 Other chronic pain: Secondary | ICD-10-CM

## 2016-10-03 DIAGNOSIS — S46021A Laceration of muscle(s) and tendon(s) of the rotator cuff of right shoulder, initial encounter: Secondary | ICD-10-CM | POA: Diagnosis not present

## 2016-10-03 DIAGNOSIS — M25512 Pain in left shoulder: Principal | ICD-10-CM

## 2016-10-03 DIAGNOSIS — S43432A Superior glenoid labrum lesion of left shoulder, initial encounter: Secondary | ICD-10-CM | POA: Insufficient documentation

## 2016-10-03 DIAGNOSIS — S4992XA Unspecified injury of left shoulder and upper arm, initial encounter: Secondary | ICD-10-CM | POA: Diagnosis not present

## 2016-10-03 DIAGNOSIS — S4991XA Unspecified injury of right shoulder and upper arm, initial encounter: Secondary | ICD-10-CM | POA: Diagnosis not present

## 2016-10-03 MED ORDER — GADOBENATE DIMEGLUMINE 529 MG/ML IV SOLN
0.1000 mL | Freq: Once | INTRAVENOUS | Status: AC | PRN
  Administered 2016-10-03: 0.1 mL via INTRA_ARTICULAR

## 2016-10-03 MED ORDER — LIDOCAINE HCL (PF) 1 % IJ SOLN
10.0000 mL | Freq: Once | INTRAMUSCULAR | Status: AC
  Administered 2016-10-03: 10 mL
  Filled 2016-10-03: qty 10

## 2016-10-03 MED ORDER — IOPAMIDOL (ISOVUE-200) INJECTION 41%
15.0000 mL | Freq: Once | INTRAVENOUS | Status: AC | PRN
  Administered 2016-10-03: 15 mL
  Filled 2016-10-03: qty 50

## 2016-10-03 NOTE — Procedures (Signed)
Order checked.  Written informed consent obtained.  Timeout performed.  Prepped and draped in sterile fashion. 1% Xylocaine local anesthetic.  22g needle advanced under fluor guidance into left shoulder joint. 9cc of mixture (12 cc Isovue 299, 3 cc 1 % xylocaine and 0.1 cc MultiHance (instilled.  No complication.  To MR.

## 2016-10-03 NOTE — CV Procedure (Signed)
Order check.  Procedure and risks reviewed with pt.  Written consent obtained.  Time out performed  Under sterile technique, 22 g spinal needle advanced into right shoulder joint with 1% Xylocaine local.  9 cc of mixture (112cc Isovue 200, 3 cc 1% Xylocaine and 0.1 cc MultiHance) instilled. No complications.  To MR.

## 2016-10-05 DIAGNOSIS — M461 Sacroiliitis, not elsewhere classified: Secondary | ICD-10-CM | POA: Diagnosis not present

## 2016-10-06 ENCOUNTER — Ambulatory Visit (INDEPENDENT_AMBULATORY_CARE_PROVIDER_SITE_OTHER): Payer: Medicare Other | Admitting: Orthopaedic Surgery

## 2016-10-12 DIAGNOSIS — E039 Hypothyroidism, unspecified: Secondary | ICD-10-CM | POA: Diagnosis not present

## 2016-10-12 DIAGNOSIS — E784 Other hyperlipidemia: Secondary | ICD-10-CM | POA: Diagnosis not present

## 2016-10-12 DIAGNOSIS — M150 Primary generalized (osteo)arthritis: Secondary | ICD-10-CM | POA: Diagnosis not present

## 2016-10-12 DIAGNOSIS — I10 Essential (primary) hypertension: Secondary | ICD-10-CM | POA: Diagnosis not present

## 2016-10-12 DIAGNOSIS — E049 Nontoxic goiter, unspecified: Secondary | ICD-10-CM | POA: Diagnosis not present

## 2016-10-12 DIAGNOSIS — M810 Age-related osteoporosis without current pathological fracture: Secondary | ICD-10-CM | POA: Diagnosis not present

## 2016-10-13 ENCOUNTER — Encounter: Payer: Self-pay | Admitting: Family Medicine

## 2016-10-13 ENCOUNTER — Ambulatory Visit (INDEPENDENT_AMBULATORY_CARE_PROVIDER_SITE_OTHER): Payer: Medicare Other | Admitting: Family Medicine

## 2016-10-13 VITALS — BP 136/64 | HR 79 | Temp 98.1°F | Resp 16 | Ht 62.0 in | Wt 218.4 lb

## 2016-10-13 DIAGNOSIS — R7303 Prediabetes: Secondary | ICD-10-CM

## 2016-10-13 DIAGNOSIS — G43109 Migraine with aura, not intractable, without status migrainosus: Secondary | ICD-10-CM | POA: Diagnosis not present

## 2016-10-13 DIAGNOSIS — G8929 Other chronic pain: Secondary | ICD-10-CM | POA: Diagnosis not present

## 2016-10-13 DIAGNOSIS — E785 Hyperlipidemia, unspecified: Secondary | ICD-10-CM | POA: Diagnosis not present

## 2016-10-13 DIAGNOSIS — F3160 Bipolar disorder, current episode mixed, unspecified: Secondary | ICD-10-CM | POA: Diagnosis not present

## 2016-10-13 DIAGNOSIS — M797 Fibromyalgia: Secondary | ICD-10-CM | POA: Diagnosis not present

## 2016-10-13 DIAGNOSIS — D649 Anemia, unspecified: Secondary | ICD-10-CM

## 2016-10-13 DIAGNOSIS — I10 Essential (primary) hypertension: Secondary | ICD-10-CM

## 2016-10-13 DIAGNOSIS — M3500 Sicca syndrome, unspecified: Secondary | ICD-10-CM | POA: Diagnosis not present

## 2016-10-13 DIAGNOSIS — I471 Supraventricular tachycardia, unspecified: Secondary | ICD-10-CM

## 2016-10-13 DIAGNOSIS — E669 Obesity, unspecified: Secondary | ICD-10-CM

## 2016-10-13 DIAGNOSIS — I209 Angina pectoris, unspecified: Secondary | ICD-10-CM

## 2016-10-13 DIAGNOSIS — J302 Other seasonal allergic rhinitis: Secondary | ICD-10-CM

## 2016-10-13 DIAGNOSIS — R739 Hyperglycemia, unspecified: Secondary | ICD-10-CM | POA: Diagnosis not present

## 2016-10-13 MED ORDER — MONTELUKAST SODIUM 10 MG PO TABS: 10.0000 mg | ORAL_TABLET | Freq: Every day | ORAL | 1 refills | Status: DC

## 2016-10-13 MED ORDER — DIVALPROEX SODIUM 250 MG PO DR TAB: 250.0000 mg | DELAYED_RELEASE_TABLET | Freq: Two times a day (BID) | ORAL | 0 refills | Status: DC

## 2016-10-13 MED ORDER — METFORMIN HCL ER (OSM) 500 MG PO TB24: 500.0000 mg | ORAL_TABLET | Freq: Every day | ORAL | 0 refills | Status: DC

## 2016-10-13 MED ORDER — METOPROLOL TARTRATE 25 MG PO TABS: 25.0000 mg | ORAL_TABLET | Freq: Two times a day (BID) | ORAL | 1 refills | Status: DC

## 2016-10-13 MED ORDER — OLMESARTAN MEDOXOMIL-HCTZ 40-12.5 MG PO TABS: 1.0000 | ORAL_TABLET | Freq: Every day | ORAL | 0 refills | Status: DC

## 2016-10-13 NOTE — Progress Notes (Signed)
Name: Jacqueline Lucas   MRN: [id number]    DOB: 20-Sep-1950   Date:10/13/2016       Progress Note  Subjective  Chief Complaint  Chief Complaint  Patient presents with  . Hypertension    3 month follow up  . Hyperlipidemia  . Depression  . Anxiety  . Pain    lower back and shoulders pt has appt to see speacilist    HPI  HTN: she is taking Benicar - 40-12.5, mteoprolol, and is doing well, no recent chest pain or palpitation. She has been gradually gaining weight and bp is well controlled today - checks BP aat home and highest has been 140's/80's, 130's/70's. No chest pain; does have some shortness of breath due to COPD.  No BLE swelling.   Vision Decreased: She notes that she saw her opthalmologic Dr. Thomasene Ripple 2 months ago, but she would like to see a specialist. Notes that her LEFT eye vision will sometimes go completely black - lasts a few minutes at the most and then vision returns. Has told Neurology and they told her this was related to her migraines.  Advised her to call her opthalmology office where she already goes to ask for a referral to a second opinion.   Bipolar disorder: she is doing well on Depakote and Effexor, denies mania, sleeping better - 6-8 hours per night.  When she goes out of the home and into a store, she has noticed that she feels like she is in a tunnel - has had a panic attack in [place] a while ago and this feels similar.  Migraine headaches: used to go to the headache clinic, but states she was taking too many medications. Migraines were under control however got worse and we referred her to neurologist at [facility].  Migraines are now occurring twice a week and lasting 1-2 hours an episodes.  She is taking Gabapentin, has a few percocet left at her home but she is no longer using.  Gabapentin Rx'd by her Rheumatologist.  FMS: seeing Dr. Olegario Messier on muscle relaxer and gabapentin. She states symptoms worse when there is cold weather or rain. Aching  all over today.  Dyslipidemia: currently on lipitor only, off Tricor. No issues on medication at this time. We will check labs at next appointment.  Anemia: she had extensive evaluation done by Dr. Fleet Contras and per patient level is back to normal.   Goiter: having dysphagia, and is contemplating surgery, she was seen by Dr. Ronnald Collum.   COPD mild: from second hand smoking, taking Dulera and singulair. She has occasional cough and wheezing, but doing well at this time.  Left arthoplasty thumb: done March by Dr. Erlinda Hong - was doing better initially, but it is still hurting like it did prior to the procedure. Finished PT. She is going to call Dr. Erlinda Hong to follow up on this.  Back Pain: Has started to worsen over the last 4-5 months, she is seeing Dr. Retia Passe in [place], [place] next week. They are planning to do an injection into her back.  Angina/ History of SVT: doing well, on metoprolol, statin, and aspirin, sees Dr. Clayborn Bigness yearly. Not currently having any problems  Sicca: sees Rheumatologist   Obesity & Hyperglycemia: she is worried about her weight gain, she would like help to lose weight. Had elevated A1C of 6.1% at last visit. We will recheck fasting labs when patient returns fasting next week.  Patient Active Problem List   Diagnosis Date Noted  . Arthritis  of carpometacarpal [facility]) joint of left thumb 05/12/2016  . Bilateral thumb pain 05/01/2016  . Chronic bronchitis ([place]) 04/14/2016  . Anemia 02/03/2016  . Status post bilateral total hip replacement 01/10/2016  . Sicca ([place]) 01/10/2016  . Age related osteoporosis 01/10/2016  . Hip arthritis 08/16/2015  . Microscopic hematuria 07/30/2015  . Vaginal atrophy 07/30/2015  . GAD (generalized anxiety disorder) 07/08/2015  . Moderate episode of recurrent major depressive disorder ([place]) 07/08/2015  . Migraine with aura and without status migrainosus, not intractable 07/08/2015  . Dyslipidemia 07/08/2015  . Degenerative arthritis  of right knee 03/16/2015  . Atrophic vaginitis 01/31/2015  . Recurrent UTI 12/31/2014  . IBS (irritable bowel syndrome) 10/29/2014  . Asthma, moderate persistent 09/08/2014  . Allergic state 09/07/2014  . Colon polyp 09/07/2014  . Hemorrhoid 09/07/2014  . Constrictive tenosynovitis 03/12/2014  . H/O total knee replacement, bilateral 07/31/2013  . Angina pectoris ([place]) 11/28/2012  . Anxiety 11/28/2012  . Acid reflux 11/28/2012  . Cardiac murmur 11/28/2012  . BP (high blood pressure) 11/28/2012  . Cannot sleep 11/28/2012  . Adiposity 11/28/2012  . Arthritis, degenerative 11/28/2012  . Restless leg 11/28/2012  . Supraventricular tachycardia ([place]) 11/28/2012  . Fibromyalgia 11/28/2012    Past Surgical History:  Procedure Laterality Date  . ABDOMINAL HYSTERECTOMY     due to cancer of ovaries  . BACK SURGERY N/A 2014   x 5  . CARDIAC CATHETERIZATION     no PCI; 12/18/12 ([facility], Dr. Marcello Moores): EP study with ablation. No coronary angiography done then.  . COLONOSCOPY W/ POLYPECTOMY    . COLONOSCOPY WITH PROPOFOL N/A 02/04/2016   Procedure: COLONOSCOPY WITH PROPOFOL;  Surgeon: Robert Bellow, MD;  [facility] ENDOSCOPY;  Service: Endoscopy;  Laterality: N/A;  . ESOPHAGOGASTRODUODENOSCOPY (EGD) WITH PROPOFOL N/A 02/04/2016   Procedure: ESOPHAGOGASTRODUODENOSCOPY (EGD) WITH PROPOFOL;  Surgeon: Robert Bellow, MD;  Location: [facility] ENDOSCOPY;  Service: Endoscopy;  Laterality: N/A;  . FINGER ARTHROSCOPY WITH CARPOMETACARPEL ([place]) ARTHROPLASTY Left 05/01/2016   Dr. Erlinda Hong  . GIVENS CAPSULE STUDY N/A 05/02/2016   Procedure: GIVENS CAPSULE STUDY;  Surgeon: Manya Silvas, MD;  Location: [facility] ENDOSCOPY;  Service: Endoscopy;  Laterality: N/A;  . HEMORRHOID SURGERY    . JOINT REPLACEMENT Bilateral    knee  . REPLACEMENT TOTAL KNEE Left   . SPINE SURGERY     x 2  . tendonititis     right elbow, right wrist  . TOTAL HIP ARTHROPLASTY Left 2013  . TOTAL HIP ARTHROPLASTY Right 08/16/2015    Procedure: TOTAL HIP ARTHROPLASTY ANTERIOR APPROACH;  Surgeon: Meredith Pel, MD;  Location: [place];  Service: Orthopedics;  Laterality: Right;  . TOTAL KNEE ARTHROPLASTY Right 03/16/2015   Procedure: RIGHT TOTAL KNEE ARTHROPLASTY, PCL SACRIFICING;  Surgeon: Meredith Pel, MD;  Location: [place];  Service: Orthopedics;  Laterality: Right;    Family History  Problem Relation Age of Onset  . Diabetes Mother   . Hypertension Mother   . Heart disease Mother   . Diabetes Father   . Cancer Father   . Hypertension Sister   . Depression Sister   . Cancer Brother   . Heart disease Brother   . Bladder Cancer Neg Hx   . Kidney disease Neg Hx     Social History   Social History  . Marital status: Married    Spouse name: N/A  . Number of children: N/A  . Years of education: N/A   Occupational History  . Not on  file.   Social History Main Topics  . Smoking status: Never Smoker  . Smokeless tobacco: Never Used  . Alcohol use No  . Drug use: No  . Sexual activity: No   Other Topics Concern  . Not on file   Social History Narrative  . No narrative on file     Current Outpatient Prescriptions:  .  amoxicillin (AMOXIL) 500 MG capsule, as needed. DENTAL WORK / TOTAL JOINT REPLACEMENT, Disp: , Rfl:  .  aspirin 81 MG chewable tablet, Chew 81 mg by mouth daily., Disp: , Rfl:  .  atorvastatin (LIPITOR) 40 MG tablet, Take 1 tablet (40 mg total) by mouth at bedtime., Disp: 90 tablet, Rfl: 1 .  baclofen (LIORESAL) 10 MG tablet, TAKE ONE TABLET AT 7AM AND ONE TABLET AT 2PM AS NEEDED FOR MUSCLE SPASM, Disp: 180 tablet, Rfl: 1 .  Cholecalciferol (D3-1000) 1000 units tablet, Take 1,000 Units by mouth at bedtime. , Disp: , Rfl:  .  cycloSPORINE (RESTASIS) 0.05 % ophthalmic emulsion, Place 1 drop into both eyes 2 (two) times daily. , Disp: , Rfl:  .  diclofenac sodium (VOLTAREN) 1 % GEL, Apply 3 g topically 3 (three) times daily as needed., Disp: , Rfl:  .  divalproex (DEPAKOTE) 250 MG DR  tablet, Take 1 tablet (250 mg total) by mouth 2 (two) times daily., Disp: 180 tablet, Rfl: 0 .  ESTRACE VAGINAL 0.1 MG/GM vaginal cream, , Disp: , Rfl:  .  gabapentin (NEURONTIN) 300 MG capsule, Take 3 capsules (900 mg total) by mouth 3 (three) times daily., Disp: 810 capsule, Rfl: 0 .  ibuprofen (ADVIL,MOTRIN) 600 MG tablet, , Disp: , Rfl:  .  lansoprazole (PREVACID) 30 MG capsule, Take 1 capsule (30 mg total) by mouth 2 (two) times daily before a meal., Disp: 180 capsule, Rfl: 1 .  levalbuterol (XOPENEX) 1.25 MG/3ML nebulizer solution, Take 1.25 mg by nebulization every 4 (four) hours as needed for wheezing., Disp: 72 mL, Rfl: 4 .  levothyroxine (SYNTHROID) 88 MCG tablet, Take 88 mcg by mouth daily before breakfast. , Disp: , Rfl:  .  magnesium oxide (MAG-OX) 400 MG tablet, Take 400 mg by mouth at bedtime. Reported on 07/28/2015, Disp: , Rfl:  .  metoprolol tartrate (LOPRESSOR) 25 MG tablet, Take 1 tablet (25 mg total) by mouth 2 (two) times daily., Disp: 180 tablet, Rfl: 1 .  mometasone-formoterol (DULERA) 200-5 MCG/ACT AERO, Inhale 2 puffs into the lungs 2 (two) times daily. Reported on 07/28/2015, Disp: 3 Inhaler, Rfl: 0 .  montelukast (SINGULAIR) 10 MG tablet, Take 1 tablet (10 mg total) by mouth at bedtime., Disp: 90 tablet, Rfl: 1 .  olmesartan-hydrochlorothiazide (BENICAR HCT) 40-12.5 MG tablet, Take 1 tablet by mouth daily. In place of Diovan/HCTZ, Disp: 90 tablet, Rfl: 0 .  ondansetron (ZOFRAN) 4 MG tablet, Take 1 tablet (4 mg total) by mouth every 8 (eight) hours as needed for nausea or vomiting., Disp: 20 tablet, Rfl: 0 .  oxyCODONE-acetaminophen (PERCOCET/ROXICET) 5-325 MG tablet, Take 1 tablet by mouth every 6 (six) hours as needed., Disp: , Rfl:  .  polyethylene glycol powder (GLYCOLAX/MIRALAX) powder, 255 grams one bottle for capsule endoscopy prep, Disp: 255 g, Rfl: 0 .  Tetrahydrozoline HCl (EYE DROPS OP), Apply to eye., Disp: , Rfl:  .  venlafaxine XR (EFFEXOR-XR) 150 MG 24 hr  capsule, TAKE ONE CAPSULE BY MOUTH EVERY DAY WITH BREAKFAST, Disp: 90 capsule, Rfl: 0 .  vitamin B-12 (CYANOCOBALAMIN) 1000 MCG tablet, Take 1,000 mcg by  mouth at bedtime., Disp: , Rfl:  .  diclofenac (VOLTAREN) 75 MG EC tablet, Take 1 tablet (75 mg total) by mouth 2 (two) times daily. (Patient not taking: Reported on 10/13/2016), Disp: 60 tablet, Rfl: 1  Current Facility-Administered Medications:  .  ceFAZolin (ANCEF) 2 g in dextrose 5 % 100 mL IVPB, 2 g, Intravenous, Once, Byrnett, Forest Gleason, MD  Allergies  Allergen Reactions  . Morphine Nausea And Vomiting    Ok with nausea medication  . Diazepam Nausea And Vomiting  . Lyrica [Pregabalin] Other (See Comments)    Joint swelling  . Latex Rash  . Rash Away  [Petrolatum-Zinc Oxide] Rash and Swelling  . Salicylates Other (See Comments)    Other reaction(s): Headache  . Tape Rash    Please use paper tape     ROS  Constitutional: Negative for fever or weight change.  Respiratory: Negative for cough and shortness of breath.   Cardiovascular: Negative for chest pain or palpitations.  Gastrointestinal: Negative for abdominal pain, no bowel changes.  Musculoskeletal: Positive  for gait problem when pain on her back is severe or joint swelling.  Skin: Negative for rash.  Neurological: Negative for dizziness , positive for intermittent  headache.  No other specific complaints in a complete review of systems (except as listed in HPI above).  Objective  Vitals:   10/13/16 1328  BP: 136/64  Pulse: 79  Resp: 16  Temp: 98.1 F (36.7 C)  SpO2: 93%  Weight: 218 lb 6 oz (99.1 kg)  Height: 5\' 2"  (1.575 m)    Body mass index is 39.94 kg/m.  Physical Exam  Constitutional: Patient appears well-developed and well-nourished. Obese  No distress.  HEENT: head atraumatic, normocephalic, pupils equal and reactive to light, neck supple, thyroid nodule , throat within normal limits Cardiovascular: Normal rate, regular rhythm and normal heart  sounds.  No murmur heard. No BLE edema. Pulmonary/Chest: Effort normal and breath sounds normal. No respiratory distress. Abdominal: Soft.  There is no tenderness. Psychiatric: Patient has a normal mood and affect. behavior is normal. Judgment and thought content normal. Muscular Skeletal: pain during palpation of trigger points, also during palpation of lumbar spine, negative straight leg raise   PHQ2/9: Depression screen [facility] 2/9 07/12/2016 04/14/2016 01/11/2016 12/10/2015 07/08/2015  Decreased Interest 0 0 0 0 0  Down, Depressed, Hopeless 0 0 0 0 0  PHQ - 2 Score 0 0 0 0 0     Fall Risk: Fall Risk  07/12/2016 04/14/2016 01/11/2016 12/10/2015 07/08/2015  Falls in the past year? Yes Yes Yes No No  Number falls in past yr: 2 or more 1 1 - -  Injury with Fall? No Yes Yes - -  Risk Factor Category  - - High Fall Risk - -  Risk for fall due to : - - - - -  Follow up - - Falls evaluation completed - -     Assessment & Plan  1. Essential hypertension  - olmesartan-hydrochlorothiazide (BENICAR HCT) 40-12.5 MG tablet; Take 1 tablet by mouth daily. In place of Diovan/HCTZ  Dispense: 90 tablet; Refill: 0 - metoprolol tartrate (LOPRESSOR) 25 MG tablet; Take 1 tablet (25 mg total) by mouth 2 (two) times daily.  Dispense: 180 tablet; Refill: 1  2. Bipolar 1 disorder, mixed (HCC)  - divalproex (DEPAKOTE) 250 MG DR tablet; Take 1 tablet (250 mg total) by mouth 2 (two) times daily.  Dispense: 180 tablet; Refill: 0  3. Migraine with aura and without status migrainosus,  not intractable  - metoprolol tartrate (LOPRESSOR) 25 MG tablet; Take 1 tablet (25 mg total) by mouth 2 (two) times daily.  Dispense: 180 tablet; Refill: 1  4. Chronic seasonal allergic rhinitis  - montelukast (SINGULAIR) 10 MG tablet; Take 1 tablet (10 mg total) by mouth at bedtime.  Dispense: 90 tablet; Refill: 1  5. Sicca, unspecified type ([place])  Stable, sees Dr. Cyndia Diver   6. Supraventricular tachycardia ([place])  Doing  well on metoprolol and s/p ablation   7. Angina pectoris ([place])  Only on metoprolol and aspirin, still sees Dr. Clayborn Bigness  8. Dyslipidemia  Continue atorvastatin  9. Fibromyalgia  Body aches have improved  10. Other chronic pain  Going back to Dr. Andree Elk, neurosurgeon - goes to [place]  11. Hyperglycemia  - Hemoglobin A1c - Insulin, fasting - metformin (FORTAMET) 500 MG (OSM) 24 hr tablet; Take 1 tablet (500 mg total) by mouth daily with breakfast.  Dispense: 90 tablet; Refill: 0  12. Obesity (BMI 30-39.9)  Discussed life style modification, also discussed GLP-1 agonist and she will read about, we will start metformin now.  She denies personal history of pancreatitis or family history of thyroid cancer or MEN  13. Mild anemia  - CBC with Differential/Platelet  14. Pre-diabetes  - Hemoglobin A1c - metformin (FORTAMET) 500 MG (OSM) 24 hr tablet; Take 1 tablet (500 mg total) by mouth daily with breakfast.  Dispense: 90 tablet; Refill: 0

## 2016-10-13 NOTE — Patient Instructions (Signed)
Liraglutide injection What is this medicine? LIRAGLUTIDE (LIR a GLOO tide) is used to improve blood sugar control in adults with type 2 diabetes. This medicine may be used with other diabetes medicines. This drug may also reduce the risk of heart attack or stroke if you have type 2 diabetes and risk factors for heart disease. This medicine may be used for other purposes; ask your health care provider or pharmacist if you have questions. COMMON BRAND NAME(S): Victoza What should I tell my health care provider before I take this medicine? They need to know if you have any of these conditions: -endocrine tumors (MEN 2) or if someone in your family had these tumors -gallbladder disease -high cholesterol -history of alcohol abuse problem -history of pancreatitis -kidney disease or if you are on dialysis -liver disease -previous swelling of the tongue, face, or lips with difficulty breathing, difficulty swallowing, hoarseness, or tightening of the throat -stomach problems -thyroid cancer or if someone in your family had thyroid cancer -an unusual or allergic reaction to liraglutide, other medicines, foods, dyes, or preservatives -pregnant or trying to get pregnant -breast-feeding How should I use this medicine? This medicine is for injection under the skin of your upper leg, stomach area, or upper arm. You will be taught how to prepare and give this medicine. Use exactly as directed. Take your medicine at regular intervals. Do not take it more often than directed. It is important that you put your used needles and syringes in a special sharps container. Do not put them in a trash can. If you do not have a sharps container, call your pharmacist or healthcare provider to get one. A special MedGuide will be given to you by the pharmacist with each prescription and refill. Be sure to read this information carefully each time. Talk to your pediatrician regarding the use of this medicine in children.  Special care may be needed. Overdosage: If you think you have taken too much of this medicine contact a poison control center or emergency room at once. NOTE: This medicine is only for you. Do not share this medicine with others. What if I miss a dose? If you miss a dose, take it as soon as you can. If it is almost time for your next dose, take only that dose. Do not take double or extra doses. What may interact with this medicine? -other medicines for diabetes Many medications may cause changes in blood sugar, these include: -alcohol containing beverages -antiviral medicines for HIV or AIDS -aspirin and aspirin-like drugs -certain medicines for blood pressure, heart disease, irregular heart beat -chromium -diuretics -female hormones, such as estrogens or progestins, birth control pills -fenofibrate -gemfibrozil -isoniazid -lanreotide -female hormones or anabolic steroids -MAOIs like Carbex, Eldepryl, Marplan, Nardil, and Parnate -medicines for weight loss -medicines for allergies, asthma, cold, or cough -medicines for depression, anxiety, or psychotic disturbances -niacin -nicotine -NSAIDs, medicines for pain and inflammation, like ibuprofen or naproxen -octreotide -pasireotide -pentamidine -phenytoin -probenecid -quinolone antibiotics such as ciprofloxacin, levofloxacin, ofloxacin -some herbal dietary supplements -steroid medicines such as prednisone or cortisone -sulfamethoxazole; trimethoprim -thyroid hormones Some medications can hide the warning symptoms of low blood sugar (hypoglycemia). You may need to monitor your blood sugar more closely if you are taking one of these medications. These include: -beta-blockers, often used for high blood pressure or heart problems (examples include atenolol, metoprolol, propranolol) -clonidine -guanethidine -reserpine This list may not describe all possible interactions. Give your health care provider a list of all the medicines,    herbs, non-prescription drugs, or dietary supplements you use. Also tell them if you smoke, drink alcohol, or use illegal drugs. Some items may interact with your medicine. What should I watch for while using this medicine? Visit your doctor or health care professional for regular checks on your progress. Drink plenty of fluids while taking this medicine. Check with your doctor or health care professional if you get an attack of severe diarrhea, nausea, and vomiting. The loss of too much body fluid can make it dangerous for you to take this medicine. A test called the HbA1C (A1C) will be monitored. This is a simple blood test. It measures your blood sugar control over the last 2 to 3 months. You will receive this test every 3 to 6 months. Learn how to check your blood sugar. Learn the symptoms of low and high blood sugar and how to manage them. Always carry a quick-source of sugar with you in case you have symptoms of low blood sugar. Examples include hard sugar candy or glucose tablets. Make sure others know that you can choke if you eat or drink when you develop serious symptoms of low blood sugar, such as seizures or unconsciousness. They must get medical help at once. Tell your doctor or health care professional if you have high blood sugar. You might need to change the dose of your medicine. If you are sick or exercising more than usual, you might need to change the dose of your medicine. Do not skip meals. Ask your doctor or health care professional if you should avoid alcohol. Many nonprescription cough and cold products contain sugar or alcohol. These can affect blood sugar. Pens should never be shared. Even if the needle is changed, sharing may result in passing of viruses like hepatitis or HIV. Wear a medical ID bracelet or chain, and carry a card that describes your disease and details of your medicine and dosage times. What side effects may I notice from receiving this medicine? Side effects  that you should report to your doctor or health care professional as soon as possible: -allergic reactions like skin rash, itching or hives, swelling of the face, lips, or tongue -breathing problems -diarrhea that continues or is severe -lump or swelling on the neck -severe nausea -signs and symptoms of infection like fever or chills; cough; sore throat; pain or trouble passing urine -signs and symptoms of low blood sugar such as feeling anxious, confusion, dizziness, increased hunger, unusually weak or tired, sweating, shakiness, cold, irritable, headache, blurred vision, fast heartbeat, loss of consciousness -signs and symptoms of kidney injury like trouble passing urine or change in the amount of urine -trouble swallowing -unusual stomach upset or pain -vomiting Side effects that usually do not require medical attention (report to your doctor or health care professional if they continue or are bothersome): -constipation -decreased appetite -diarrhea -fatigue -headache -nausea -pain, redness, or irritation at site where injected -stomach upset -stuffy or runny nose This list may not describe all possible side effects. Call your doctor for medical advice about side effects. You may report side effects to FDA at 1-800-FDA-1088. Where should I keep my medicine? Keep out of the reach of children. Store unopened pen in a refrigerator between 2 and 8 degrees C (36 and 46 degrees F). Do not freeze or use if the medicine has been frozen. Protect from light and excessive heat. After you first use the pen, it can be stored at room temperature between 15 and 30 degrees C (59 and   86 degrees F) or in a refrigerator. Throw away your used pen after 30 days or after the expiration date, whichever comes first. Do not store your pen with the needle attached. If the needle is left on, medicine may leak from the pen. NOTE: This sheet is a summary. It may not cover all possible information. If you have  questions about this medicine, talk to your doctor, pharmacist, or health care provider.  2018 Elsevier/Gold Standard (2016-03-02 14:39:40)  

## 2016-10-18 ENCOUNTER — Ambulatory Visit (INDEPENDENT_AMBULATORY_CARE_PROVIDER_SITE_OTHER): Payer: Medicare Other | Admitting: Orthopedic Surgery

## 2016-10-18 ENCOUNTER — Encounter (INDEPENDENT_AMBULATORY_CARE_PROVIDER_SITE_OTHER): Payer: Self-pay | Admitting: Orthopedic Surgery

## 2016-10-18 DIAGNOSIS — I209 Angina pectoris, unspecified: Secondary | ICD-10-CM

## 2016-10-18 DIAGNOSIS — M25511 Pain in right shoulder: Secondary | ICD-10-CM

## 2016-10-18 DIAGNOSIS — M25512 Pain in left shoulder: Secondary | ICD-10-CM

## 2016-10-18 NOTE — Progress Notes (Signed)
Office Visit Note   Patient: Jacqueline Lucas           Date of Birth: 06-Jan-1951           MRN: [id number] Visit Date: 10/18/2016 Requested by: [name], [address] [place] [address] [place], [place] [address] PCP: [name], MD  Subjective: Chief Complaint  Patient presents with  . Shoulder Pain    bilateral shoulder MRI review    HPI: [name] is a 66 year old patient here for review of bilateral shoulder MRI scans.  She's had bilateral shoulder acromioclavicular joint injections and is currently using topical anti-inflammatory.  She states that her shoulders are at a state where she can live with them.  She's not really having any weakness or mechanical symptoms in either shoulder.              ROS: All systems reviewed are negative as they relate to the chief complaint within the history of present illness.  Patient denies  fevers or chills.   Assessment & Plan: Visit Diagnoses: No diagnosis found.  Plan: Impression is bilateral shoulder pain with improvement following acromioclavicular joint injections and topical anti-inflammatory.  Right shoulder MRI scan shows partial-thickness rotator cuff tearing which could develop into a full-thickness tear.  She has reasonable strength today on exam and thus I think we can wait on any intervention for the right shoulder.  Left shoulder demonstrates SLAP tear but intact rotator cuff.  She is fairly asymptomatic from this and has no mechanical symptoms in the left shoulder.  This is something we can watch as well.  I will see her back as needed  Follow-Up Instructions: Return if symptoms worsen or fail to improve.   Orders:  No orders of the defined types were placed in this encounter.  No orders of the defined types were placed in this encounter.     Procedures: No procedures performed   Clinical Data: No additional findings.  Objective: Vital Signs: There were no vitals taken for this visit.  Physical Exam:    Constitutional: Patient appears well-developed HEENT:  Head: Normocephalic Eyes:EOM are normal Neck: Normal range of motion Cardiovascular: Normal rate Pulmonary/chest: Effort normal Neurologic: Patient is alert Skin: Skin is warm Psychiatric: Patient has normal mood and affect    Ortho Exam: Orthopedic exam demonstrates full active and passive range of motion of both shoulders with only mild tenderness at the acromioclavicular joint.  No restriction of passive range of motion on either shoulder.  Rotator cuff strength intact to infraspinatus supraspinatus and subscap muscle testing right and left-hand side.  Impingement signs equivocal on the right negative on the left.  Does have mildly positive O'Brien's testing on the left compared to the right.  I'll detect any course grinding with either shoulder passive range of motion or with labral load testing on the left-hand side.  Specialty Comments:  No specialty comments available.  Imaging: No results found.   PMFS History: Patient Active Problem List   Diagnosis Date Noted  . Arthritis of carpometacarpal [facility]) joint of left thumb 05/12/2016  . Bilateral thumb pain 05/01/2016  . Chronic bronchitis ([place]) 04/14/2016  . Anemia 02/03/2016  . Status post bilateral total hip replacement 01/10/2016  . Sicca ([place]) 01/10/2016  . Age related osteoporosis 01/10/2016  . Hip arthritis 08/16/2015  . Microscopic hematuria 07/30/2015  . Vaginal atrophy 07/30/2015  . GAD (generalized anxiety disorder) 07/08/2015  . Moderate episode of recurrent major depressive disorder ([place]) 07/08/2015  . Migraine with  aura and without status migrainosus, not intractable 07/08/2015  . Dyslipidemia 07/08/2015  . Degenerative arthritis of right knee 03/16/2015  . Atrophic vaginitis 01/31/2015  . Recurrent UTI 12/31/2014  . IBS (irritable bowel syndrome) 10/29/2014  . Asthma, moderate persistent 09/08/2014  . Allergic state 09/07/2014  . Colon polyp  09/07/2014  . Hemorrhoid 09/07/2014  . Constrictive tenosynovitis 03/12/2014  . H/O total knee replacement, bilateral 07/31/2013  . Angina pectoris ([place]) 11/28/2012  . Anxiety 11/28/2012  . Acid reflux 11/28/2012  . Cardiac murmur 11/28/2012  . BP (high blood pressure) 11/28/2012  . Cannot sleep 11/28/2012  . Adiposity 11/28/2012  . Arthritis, degenerative 11/28/2012  . Restless leg 11/28/2012  . Supraventricular tachycardia ([place]) 11/28/2012  . Fibromyalgia 11/28/2012   Past Medical History:  Diagnosis Date  . Allergy   . Allergy to adhesive tape    Allergy to paper tape as well  . Alzheimer disease   . Anemia   . Anginal pain ([place])   . Anxiety   . Arthritis   . Asthma   . Bronchitis   . Cataract   . Chronic nausea   . Claustrophobia   . DDD (degenerative disc disease), lumbar   . Depression   . Dry eyes    uses Restasis  . Fibromyalgia   . GERD (gastroesophageal reflux disease)   . H/O bladder infections   . Heart murmur   . Hematuria   . Hyperlipidemia   . Hypertension   . Hypothyroidism   . IBS (irritable bowel syndrome)   . Insomnia   . Lumbar neuritis   . Migraines   . Obesity   . Osteoporosis   . Palpitation   . PONV (postoperative nausea and vomiting)   . RLS (restless legs syndrome)   . Shoulder fracture, right   . Sicca ([place])   . Tachycardia   . Thyroid disease     Family History  Problem Relation Age of Onset  . Diabetes Mother   . Hypertension Mother   . Heart disease Mother   . Diabetes Father   . Cancer Father   . Hypertension Sister   . Depression Sister   . Cancer Brother   . Heart disease Brother   . Bladder Cancer Neg Hx   . Kidney disease Neg Hx     Past Surgical History:  Procedure Laterality Date  . ABDOMINAL HYSTERECTOMY     due to cancer of ovaries  . BACK SURGERY N/A 2014   x 5  . CARDIAC CATHETERIZATION     no PCI; 12/18/12 ([facility], Dr. Marcello Moores): EP study with ablation. No coronary angiography done then.  .  COLONOSCOPY W/ POLYPECTOMY    . COLONOSCOPY WITH PROPOFOL N/A 02/04/2016   Procedure: COLONOSCOPY WITH PROPOFOL;  Surgeon: Robert Bellow, MD;  Location: [facility] ENDOSCOPY;  Service: Endoscopy;  Laterality: N/A;  . ESOPHAGOGASTRODUODENOSCOPY (EGD) WITH PROPOFOL N/A 02/04/2016   Procedure: ESOPHAGOGASTRODUODENOSCOPY (EGD) WITH PROPOFOL;  Surgeon: Robert Bellow, MD;  Location: [facility] ENDOSCOPY;  Service: Endoscopy;  Laterality: N/A;  . FINGER ARTHROSCOPY WITH CARPOMETACARPEL ([place]) ARTHROPLASTY Left 05/01/2016   Dr. Erlinda Hong  . GIVENS CAPSULE STUDY N/A 05/02/2016   Procedure: GIVENS CAPSULE STUDY;  Surgeon: Manya Silvas, MD;  Location: [facility] ENDOSCOPY;  Service: Endoscopy;  Laterality: N/A;  . HEMORRHOID SURGERY    . JOINT REPLACEMENT Bilateral    knee  . REPLACEMENT TOTAL KNEE Left   . SPINE SURGERY     x 2  . tendonititis  right elbow, right wrist  . TOTAL HIP ARTHROPLASTY Left 2013  . TOTAL HIP ARTHROPLASTY Right 08/16/2015   Procedure: TOTAL HIP ARTHROPLASTY ANTERIOR APPROACH;  Surgeon: Meredith Pel, MD;  Location: [place];  Service: Orthopedics;  Laterality: Right;  . TOTAL KNEE ARTHROPLASTY Right 03/16/2015   Procedure: RIGHT TOTAL KNEE ARTHROPLASTY, PCL SACRIFICING;  Surgeon: Meredith Pel, MD;  Location: [place];  Service: Orthopedics;  Laterality: Right;   Social History   Occupational History  . Not on file.   Social History Main Topics  . Smoking status: Never Smoker  . Smokeless tobacco: Never Used  . Alcohol use No  . Drug use: No  . Sexual activity: No

## 2016-10-19 DIAGNOSIS — M461 Sacroiliitis, not elsewhere classified: Secondary | ICD-10-CM | POA: Diagnosis not present

## 2016-11-06 ENCOUNTER — Ambulatory Visit (INDEPENDENT_AMBULATORY_CARE_PROVIDER_SITE_OTHER): Payer: Medicare Other

## 2016-11-06 ENCOUNTER — Encounter (INDEPENDENT_AMBULATORY_CARE_PROVIDER_SITE_OTHER): Payer: Self-pay | Admitting: Orthopaedic Surgery

## 2016-11-06 ENCOUNTER — Ambulatory Visit (INDEPENDENT_AMBULATORY_CARE_PROVIDER_SITE_OTHER): Payer: Medicare Other | Admitting: Orthopaedic Surgery

## 2016-11-06 DIAGNOSIS — I209 Angina pectoris, unspecified: Secondary | ICD-10-CM

## 2016-11-06 DIAGNOSIS — M79642 Pain in left hand: Secondary | ICD-10-CM

## 2016-11-06 NOTE — Progress Notes (Signed)
Office Visit Note   Patient: ADER FRITZE           Date of Birth: 08/31/1950           MRN: [id number] Visit Date: 11/06/2016              Requested by: [name], [address] [place], [place] [address] PCP: [name], MD   Assessment & Plan: Visit Diagnoses:  1. Pain of left hand     Plan: Overall impression is contusion of the left hand. Recommend symptomatic treatment and braces as needed. Continue taking ibuprofen as needed. Continue icing. Follow-up as needed.  Follow-Up Instructions: Return if symptoms worsen or fail to improve.   Orders:  Orders Placed This Encounter  Procedures  . XR Hand Complete Left   No orders of the defined types were placed in this encounter.     Procedures: No procedures performed   Clinical Data: No additional findings.   Subjective: Chief Complaint  Patient presents with  . Left Hand - Pain  . Left Thumb - Pain    Patient is a 66 year old in no comes in with left hand and thumb pain since a fall about 4 months ago. She is status post left thumb LRTI.  She states the pain is sometimes in the palm. She takes ibuprofen. She endorses some tingling burning.    Review of Systems  Constitutional: Negative.   HENT: Negative.   Eyes: Negative.   Respiratory: Negative.   Cardiovascular: Negative.   Endocrine: Negative.   Musculoskeletal: Negative.   Neurological: Negative.   Hematological: Negative.   Psychiatric/Behavioral: Negative.   All other systems reviewed and are negative.    Objective: Vital Signs: There were no vitals taken for this visit.  Physical Exam  Constitutional: She is oriented to person, place, and time. She appears well-developed and well-nourished.  Pulmonary/Chest: Effort normal.  Neurological: She is alert and oriented to person, place, and time.  Skin: Skin is warm. Capillary refill takes less than 2 seconds.  Psychiatric: She has a normal mood and affect. Her  behavior is normal. Judgment and thought content normal.  Nursing note and vitals reviewed.   Ortho Exam Left hand exam shows a fully healed surgical scar. She has excellent range of motion and use of the thumb. There is no triggering. She has mild vague tenderness in the thenar compartment. Specialty Comments:  No specialty comments available.  Imaging: Xr Hand Complete Left  Result Date: 11/06/2016 Status post trapeziectomy without any acute abnormalities    PMFS History: Patient Active Problem List   Diagnosis Date Noted  . Arthritis of carpometacarpal [facility]) joint of left thumb 05/12/2016  . Bilateral thumb pain 05/01/2016  . Chronic bronchitis ([place]) 04/14/2016  . Anemia 02/03/2016  . Status post bilateral total hip replacement 01/10/2016  . Sicca ([place]) 01/10/2016  . Age related osteoporosis 01/10/2016  . Hip arthritis 08/16/2015  . Microscopic hematuria 07/30/2015  . Vaginal atrophy 07/30/2015  . GAD (generalized anxiety disorder) 07/08/2015  . Moderate episode of recurrent major depressive disorder ([place]) 07/08/2015  . Migraine with aura and without status migrainosus, not intractable 07/08/2015  . Dyslipidemia 07/08/2015  . Degenerative arthritis of right knee 03/16/2015  . Atrophic vaginitis 01/31/2015  . Recurrent UTI 12/31/2014  . IBS (irritable bowel syndrome) 10/29/2014  . Asthma, moderate persistent 09/08/2014  . Allergic state 09/07/2014  . Colon polyp 09/07/2014  . Hemorrhoid 09/07/2014  . Constrictive tenosynovitis 03/12/2014  . H/O  total knee replacement, bilateral 07/31/2013  . Angina pectoris ([place]) 11/28/2012  . Anxiety 11/28/2012  . Acid reflux 11/28/2012  . Cardiac murmur 11/28/2012  . BP (high blood pressure) 11/28/2012  . Cannot sleep 11/28/2012  . Adiposity 11/28/2012  . Arthritis, degenerative 11/28/2012  . Restless leg 11/28/2012  . Supraventricular tachycardia ([place]) 11/28/2012  . Fibromyalgia 11/28/2012   Past Medical History:    Diagnosis Date  . Allergy   . Allergy to adhesive tape    Allergy to paper tape as well  . Alzheimer disease   . Anemia   . Anginal pain ([place])   . Anxiety   . Arthritis   . Asthma   . Bronchitis   . Cataract   . Chronic nausea   . Claustrophobia   . DDD (degenerative disc disease), lumbar   . Depression   . Dry eyes    uses Restasis  . Fibromyalgia   . GERD (gastroesophageal reflux disease)   . H/O bladder infections   . Heart murmur   . Hematuria   . Hyperlipidemia   . Hypertension   . Hypothyroidism   . IBS (irritable bowel syndrome)   . Insomnia   . Lumbar neuritis   . Migraines   . Obesity   . Osteoporosis   . Palpitation   . PONV (postoperative nausea and vomiting)   . RLS (restless legs syndrome)   . Shoulder fracture, right   . Sicca ([place])   . Tachycardia   . Thyroid disease     Family History  Problem Relation Age of Onset  . Diabetes Mother   . Hypertension Mother   . Heart disease Mother   . Diabetes Father   . Cancer Father   . Hypertension Sister   . Depression Sister   . Cancer Brother   . Heart disease Brother   . Bladder Cancer Neg Hx   . Kidney disease Neg Hx     Past Surgical History:  Procedure Laterality Date  . ABDOMINAL HYSTERECTOMY     due to cancer of ovaries  . BACK SURGERY N/A 2014   x 5  . CARDIAC CATHETERIZATION     no PCI; 12/18/12 ([facility], Dr. Marcello Moores): EP study with ablation. No coronary angiography done then.  . COLONOSCOPY W/ POLYPECTOMY    . COLONOSCOPY WITH PROPOFOL N/A 02/04/2016   Procedure: COLONOSCOPY WITH PROPOFOL;  Surgeon: Robert Bellow, MD;  Location: [facility] ENDOSCOPY;  Service: Endoscopy;  Laterality: N/A;  . ESOPHAGOGASTRODUODENOSCOPY (EGD) WITH PROPOFOL N/A 02/04/2016   Procedure: ESOPHAGOGASTRODUODENOSCOPY (EGD) WITH PROPOFOL;  Surgeon: Robert Bellow, MD;  Location: [facility] ENDOSCOPY;  Service: Endoscopy;  Laterality: N/A;  . FINGER ARTHROSCOPY WITH CARPOMETACARPEL ([place]) ARTHROPLASTY Left 05/01/2016    Dr. Erlinda Hong  . GIVENS CAPSULE STUDY N/A 05/02/2016   Procedure: GIVENS CAPSULE STUDY;  Surgeon: Manya Silvas, MD;  Location: [facility] ENDOSCOPY;  Service: Endoscopy;  Laterality: N/A;  . HEMORRHOID SURGERY    . JOINT REPLACEMENT Bilateral    knee  . REPLACEMENT TOTAL KNEE Left   . SPINE SURGERY     x 2  . tendonititis     right elbow, right wrist  . TOTAL HIP ARTHROPLASTY Left 2013  . TOTAL HIP ARTHROPLASTY Right 08/16/2015   Procedure: TOTAL HIP ARTHROPLASTY ANTERIOR APPROACH;  Surgeon: Meredith Pel, MD;  Location: [place];  Service: Orthopedics;  Laterality: Right;  . TOTAL KNEE ARTHROPLASTY Right 03/16/2015   Procedure: RIGHT TOTAL KNEE ARTHROPLASTY, PCL SACRIFICING;  Surgeon: Tonna Corner  Marlou Sa, MD;  Location: [place];  Service: Orthopedics;  Laterality: Right;   Social History   Occupational History  . Not on file.   Social History Main Topics  . Smoking status: Never Smoker  . Smokeless tobacco: Never Used  . Alcohol use No  . Drug use: No  . Sexual activity: No

## 2016-11-13 ENCOUNTER — Telehealth: Payer: Self-pay | Admitting: Rheumatology

## 2016-11-13 NOTE — Telephone Encounter (Signed)
Patient left a message requesting refill on Baclofen.

## 2016-11-14 MED ORDER — BACLOFEN 10 MG PO TABS: ORAL_TABLET | ORAL | 1 refills | Status: DC

## 2016-11-14 NOTE — Telephone Encounter (Signed)
Last Visit: 07/18/16 Next Visit: 01/16/17  Okay to refill per Dr. Estanislado Pandy

## 2016-11-14 NOTE — Telephone Encounter (Signed)
Left message to advise patient

## 2016-11-20 DIAGNOSIS — M4726 Other spondylosis with radiculopathy, lumbar region: Secondary | ICD-10-CM | POA: Diagnosis not present

## 2016-11-27 NOTE — Progress Notes (Signed)
3:05 PM   Jacqueline Lucas 1950-10-25 [id number]  Referring provider: [name], MD [address] [place], [place] [address]  Chief Complaint  Patient presents with  . Follow-up    1 year recurrent uit needs refill of medication last seen 05/17    HPI: Patient is a 66 year old Caucasian female with recurrent UTI's, vaginal atrophy and a history of hematuria who presents today for a follow up.  History of recurrent UTI's Patient will several positive urine cultures over the last year.  No documented infections in 2018.  She is not having dysuria or suprapubic pain.  She has not had fevers, chills, nausea or vomiting.    Vaginal atrophy She does have vaginal atrophy and is using vaginal estrogen cream 3 nights weekly.  History of hematuria She does not experience dysuria or suprapubic pain.  She underwent a hematuria work up in 09/2013 with a CT Urogram and cystoscopy and no GU pathology was discovered.  Was referred to nephrology.  Her UA is negative today.  She has not had any gross hematuria.     Today, is experiencing urgency x 0-3, frequency x 4-7, not restricting fluids to avoid visits to the restroom, not engaging in toilet mapping, incontinence x 0-3 and nocturia x 0-3.   Her PVR is 0 mL.    PMH: Past Medical History:  Diagnosis Date  . Allergy   . Allergy to adhesive tape    Allergy to paper tape as well  . Alzheimer disease   . Anemia   . Anginal pain ([place])   . Anxiety   . Arthritis   . Asthma   . Bronchitis   . Cataract   . Chronic nausea   . Claustrophobia   . DDD (degenerative disc disease), lumbar   . Depression   . Dry eyes    uses Restasis  . Fibromyalgia   . GERD (gastroesophageal reflux disease)   . H/O bladder infections   . Heart murmur   . Hematuria   . Hyperlipidemia   . Hypertension   . Hypothyroidism   . IBS (irritable bowel syndrome)   . Insomnia   . Lumbar neuritis   . Migraines   . Obesity   . Osteoporosis    . Palpitation   . PONV (postoperative nausea and vomiting)   . RLS (restless legs syndrome)   . Shoulder fracture, right   . [place] ([place])   . Tachycardia   . Thyroid disease     Surgical History: Past Surgical History:  Procedure Laterality Date  . ABDOMINAL HYSTERECTOMY     due to cancer of ovaries  . BACK SURGERY N/A 2014   x 5  . CARDIAC CATHETERIZATION     no PCI; 12/18/12 ([facility], Dr. Marcello Moores): EP study with ablation. No coronary angiography done then.  . COLONOSCOPY W/ POLYPECTOMY    . COLONOSCOPY WITH PROPOFOL N/A 02/04/2016   Procedure: COLONOSCOPY WITH PROPOFOL;  Surgeon: Robert Bellow, MD;  Location: [facility] ENDOSCOPY;  Service: Endoscopy;  Laterality: N/A;  . ESOPHAGOGASTRODUODENOSCOPY (EGD) WITH PROPOFOL N/A 02/04/2016   Procedure: ESOPHAGOGASTRODUODENOSCOPY (EGD) WITH PROPOFOL;  Surgeon: Robert Bellow, MD;  Location: [facility] ENDOSCOPY;  Service: Endoscopy;  Laterality: N/A;  . FINGER ARTHROSCOPY WITH CARPOMETACARPEL ([place]) ARTHROPLASTY Left 05/01/2016   Dr. Erlinda Hong  . GIVENS CAPSULE STUDY N/A 05/02/2016   Procedure: GIVENS CAPSULE STUDY;  Surgeon: Manya Silvas, MD;  Location: [facility] ENDOSCOPY;  Service: Endoscopy;  Laterality: N/A;  .  HEMORRHOID SURGERY    . JOINT REPLACEMENT Bilateral    knee  . REPLACEMENT TOTAL KNEE Left   . SPINE SURGERY     x 2  . tendonititis     right elbow, right wrist  . TOTAL HIP ARTHROPLASTY Left 2013  . TOTAL HIP ARTHROPLASTY Right 08/16/2015   Procedure: TOTAL HIP ARTHROPLASTY ANTERIOR APPROACH;  Surgeon: Meredith Pel, MD;  Location: [place];  Service: Orthopedics;  Laterality: Right;  . TOTAL KNEE ARTHROPLASTY Right 03/16/2015   Procedure: RIGHT TOTAL KNEE ARTHROPLASTY, PCL SACRIFICING;  Surgeon: Meredith Pel, MD;  Location: [place];  Service: Orthopedics;  Laterality: Right;    Home Medications:  Allergies as of 11/29/2016      Reactions   Morphine Nausea And Vomiting   Ok with nausea medication   Diazepam Nausea And Vomiting    Lyrica [pregabalin] Other (See Comments)   Joint swelling   Latex Rash   Rash Away  [petrolatum-zinc Oxide] Rash, Swelling   Salicylates Other (See Comments)   Other reaction(s): Headache   Tape Rash   Please use paper tape      Medication List       Accurate as of 11/29/16  3:05 PM. Always use your most recent med list.          amoxicillin 500 MG capsule Commonly known as:  AMOXIL as needed. DENTAL WORK / TOTAL JOINT REPLACEMENT   aspirin 81 MG chewable tablet Chew 81 mg by mouth daily.   atorvastatin 40 MG tablet Commonly known as:  LIPITOR Take 1 tablet (40 mg total) by mouth at bedtime.   baclofen 10 MG tablet Commonly known as:  LIORESAL TAKE ONE TABLET AT 7AM AND ONE TABLET AT 2PM AS NEEDED FOR MUSCLE SPASM   cycloSPORINE 0.05 % ophthalmic emulsion Commonly known as:  RESTASIS Place 1 drop into both eyes 2 (two) times daily.   D3-1000 1000 units tablet Generic drug:  Cholecalciferol Take 1,000 Units by mouth at bedtime.   diclofenac 75 MG EC tablet Commonly known as:  VOLTAREN Take 1 tablet (75 mg total) by mouth 2 (two) times daily.   diclofenac sodium 1 % Gel Commonly known as:  VOLTAREN Apply 3 g topically 3 (three) times daily as needed.   divalproex 250 MG DR tablet Commonly known as:  DEPAKOTE Take 1 tablet (250 mg total) by mouth 2 (two) times daily.   ESTRACE VAGINAL 0.1 MG/GM vaginal cream Generic drug:  estradiol   EYE DROPS OP Apply to eye.   gabapentin 300 MG capsule Commonly known as:  NEURONTIN Take 3 capsules (900 mg total) by mouth 3 (three) times daily.   ibuprofen 600 MG tablet Commonly known as:  ADVIL,MOTRIN 600 mg 2 (two) times daily.   lansoprazole 30 MG capsule Commonly known as:  PREVACID Take 1 capsule (30 mg total) by mouth 2 (two) times daily before a meal.   levalbuterol 1.25 MG/3ML nebulizer solution Commonly known as:  XOPENEX Take 1.25 mg by nebulization every 4 (four) hours as needed for wheezing.     magnesium oxide 400 MG tablet Commonly known as:  MAG-OX Take 400 mg by mouth at bedtime. Reported on 07/28/2015   metformin 500 MG (OSM) 24 hr tablet Commonly known as:  FORTAMET Take 1 tablet (500 mg total) by mouth daily with breakfast.   metoprolol tartrate 25 MG tablet Commonly known as:  LOPRESSOR Take 1 tablet (25 mg total) by mouth 2 (two) times daily.   mometasone-formoterol  200-5 MCG/ACT Aero Commonly known as:  DULERA Inhale 2 puffs into the lungs 2 (two) times daily. Reported on 07/28/2015   montelukast 10 MG tablet Commonly known as:  SINGULAIR Take 1 tablet (10 mg total) by mouth at bedtime.   olmesartan-hydrochlorothiazide 40-12.5 MG tablet Commonly known as:  BENICAR HCT Take 1 tablet by mouth daily. In place of Diovan/HCTZ   ondansetron 4 MG tablet Commonly known as:  ZOFRAN Take 1 tablet (4 mg total) by mouth every 8 (eight) hours as needed for nausea or vomiting.   oxyCODONE-acetaminophen 5-325 MG tablet Commonly known as:  PERCOCET/ROXICET Take 1 tablet by mouth every 6 (six) hours as needed.   polyethylene glycol powder powder Commonly known as:  GLYCOLAX/MIRALAX 255 grams one bottle for capsule endoscopy prep   SYNTHROID 88 MCG tablet Generic drug:  levothyroxine Take 88 mcg by mouth daily before breakfast.   venlafaxine XR 150 MG 24 hr capsule Commonly known as:  EFFEXOR-XR TAKE ONE CAPSULE BY MOUTH EVERY DAY WITH BREAKFAST   vitamin B-12 1000 MCG tablet Commonly known as:  CYANOCOBALAMIN Take 1,000 mcg by mouth at bedtime.       Allergies:  Allergies  Allergen Reactions  . Morphine Nausea And Vomiting    Ok with nausea medication  . Diazepam Nausea And Vomiting  . Lyrica [Pregabalin] Other (See Comments)    Joint swelling  . Latex Rash  . Rash Away  [Petrolatum-Zinc Oxide] Rash and Swelling  . Salicylates Other (See Comments)    Other reaction(s): Headache  . Tape Rash    Please use paper tape    Family History: Family  History  Problem Relation Age of Onset  . Diabetes Mother   . Hypertension Mother   . Heart disease Mother   . Diabetes Father   . Cancer Father   . Hypertension Sister   . Depression Sister   . Cancer Brother   . Heart disease Brother   . Bladder Cancer Neg Hx   . Kidney disease Neg Hx   . Kidney cancer Neg Hx     Social History:  reports that she has never smoked. She has never used smokeless tobacco. She reports that she does not drink alcohol or use drugs.  ROS: UROLOGY Frequent Urination?: Yes Hard to postpone urination?: No Burning/pain with urination?: No Get up at night to urinate?: Yes Leakage of urine?: No Urine stream starts and stops?: No Trouble starting stream?: No Do you have to strain to urinate?: No Blood in urine?: No Urinary tract infection?: No Sexually transmitted disease?: No Injury to kidneys or bladder?: No Painful intercourse?: No Weak stream?: No Currently pregnant?: No Vaginal bleeding?: No Last menstrual period?: n  Gastrointestinal Nausea?: No Vomiting?: No Indigestion/heartburn?: No Diarrhea?: No Constipation?: No  Constitutional Fever: No Night sweats?: No Weight loss?: No Fatigue?: No  Skin Skin rash/lesions?: No Itching?: No  Eyes Blurred vision?: No Double vision?: No  Ears/Nose/Throat Sore throat?: No Sinus problems?: No  Hematologic/Lymphatic Swollen glands?: No Easy bruising?: No  Cardiovascular Leg swelling?: No Chest pain?: No  Respiratory Cough?: No Shortness of breath?: No  Endocrine Excessive thirst?: No  Musculoskeletal Back pain?: Yes Joint pain?: No  Neurological Headaches?: Yes Dizziness?: No  Psychologic Depression?: No Anxiety?: Yes  Physical Exam: BP (!) 147/76   Pulse 71   Ht 5\' 4"  (1.626 m)   Wt 220 lb 3.2 oz (99.9 kg)   BMI 37.80 kg/m   Constitutional: Well nourished. Alert and oriented, No acute  distress. HEENT: [place] AT, moist mucus membranes. Trachea midline, no  masses. Cardiovascular: No clubbing, cyanosis, or edema. Respiratory: Normal respiratory effort, no increased work of breathing. GI: Abdomen is soft, non tender, non distended, no abdominal masses. Liver and spleen not palpable.  No hernias appreciated.  Stool sample for occult testing is not indicated.   GU: No CVA tenderness.  No bladder fullness or masses.  Atrophic external genitalia, normal pubic hair distribution, no lesions.  Normal urethral meatus, no lesions, no prolapse, no discharge.   No urethral masses, tenderness and/or tenderness. No bladder fullness, tenderness or masses.  Pale vagina mucosa, poor  estrogen effect, no discharge, no lesions, good pelvic support, no cystocele or rectocele noted.  Cervix, uterus and adnexa are surgically absent.  Anus and perineum are without rashes or lesions.    Skin: No rashes, bruises or suspicious lesions. Lymph: No cervical or inguinal adenopathy. Neurologic: Grossly intact, no focal deficits, moving all 4 extremities. Psychiatric: Normal mood and affect.  Laboratory Data: Lab Results  Component Value Date   WBC 6.2 06/08/2016   HGB 13.6 06/08/2016   HCT 40.5 06/08/2016   MCV 88 06/08/2016   PLT 226 06/08/2016    Lab Results  Component Value Date   CREATININE 0.80 06/23/2016    Lab Results  Component Value Date   HGBA1C 6.1 (H) 12/10/2015    Lab Results  Component Value Date   TSH 0.42 12/10/2015     Lab Results  Component Value Date   AST 14 12/10/2015   Lab Results  Component Value Date   ALT 13 12/10/2015    Urinalysis Negative.  See EPIC.   I have reviewed the labs  Pertinent Imaging:  Results for orders placed or performed in visit on 11/29/16  Urinalysis, Complete  Result Value Ref Range   Scan Result 0       Assessment & Plan:    1. History of recurrent UTI  - no UTI's over the last year  - She will report to our office for a CATH specimen if she should experience symptoms of infection.    2.  History of hematuria  - She did complete a hematuria workup in 2015 and no GU pathology was discovered  -  Urinalysis, Complete - negative  - RTC in one year for UA  3. Vaginal atrophy:   Patient will continue the vaginal estrogen cream 3 nights weekly.  Script given.  RTC in one year for an exam.     Return in about 1 year (around 11/29/2017) for PVR and OAB questionnaire.  These notes generated with voice recognition software. I apologize for typographical errors.  Zara Council, [facility] [address], [place], [place] [address] [phone]

## 2016-11-29 ENCOUNTER — Ambulatory Visit (INDEPENDENT_AMBULATORY_CARE_PROVIDER_SITE_OTHER): Payer: Medicare Other | Admitting: Urology

## 2016-11-29 ENCOUNTER — Encounter: Payer: Self-pay | Admitting: Urology

## 2016-11-29 VITALS — BP 147/76 | HR 71 | Ht 64.0 in | Wt 220.2 lb

## 2016-11-29 DIAGNOSIS — I209 Angina pectoris, unspecified: Secondary | ICD-10-CM | POA: Diagnosis not present

## 2016-11-29 DIAGNOSIS — Z87448 Personal history of other diseases of urinary system: Secondary | ICD-10-CM | POA: Diagnosis not present

## 2016-11-29 DIAGNOSIS — N952 Postmenopausal atrophic vaginitis: Secondary | ICD-10-CM | POA: Diagnosis not present

## 2016-11-29 DIAGNOSIS — Z87440 Personal history of urinary (tract) infections: Secondary | ICD-10-CM

## 2016-11-29 LAB — URINALYSIS, COMPLETE
Bilirubin, UA: NEGATIVE
Glucose, UA: NEGATIVE
Nitrite, UA: NEGATIVE
RBC, UA: NEGATIVE
Scan Result: 0
Specific Gravity, UA: 1.025 (ref 1.005–1.030)
Urobilinogen, Ur: 1 mg/dL (ref 0.2–1.0)
pH, UA: 6.5 (ref 5.0–7.5)

## 2016-11-29 LAB — MICROSCOPIC EXAMINATION: RBC, UA: NONE SEEN /hpf (ref 0–?)

## 2016-11-29 MED ORDER — ESTRADIOL 0.1 MG/GM VA CREA: TOPICAL_CREAM | VAGINAL | 12 refills | Status: DC

## 2016-12-01 ENCOUNTER — Telehealth: Payer: Self-pay | Admitting: Rheumatology

## 2016-12-01 NOTE — Telephone Encounter (Signed)
Patient's husband called and patient is experiencing a lot of hip pain and it is difficult for patient to walk and move around. Patient is scheduled for 01/23/17 and I advised them I would put patient on cancellation list. Patient and her husband are requesting a return call please.

## 2016-12-01 NOTE — Telephone Encounter (Signed)
Patient scheduled for Thursday with Mr. Carlyon Shadow.

## 2016-12-04 ENCOUNTER — Telehealth: Payer: Self-pay | Admitting: Family Medicine

## 2016-12-04 DIAGNOSIS — I10 Essential (primary) hypertension: Secondary | ICD-10-CM | POA: Diagnosis not present

## 2016-12-04 DIAGNOSIS — R739 Hyperglycemia, unspecified: Secondary | ICD-10-CM | POA: Diagnosis not present

## 2016-12-04 DIAGNOSIS — E785 Hyperlipidemia, unspecified: Secondary | ICD-10-CM | POA: Diagnosis not present

## 2016-12-04 DIAGNOSIS — R7303 Prediabetes: Secondary | ICD-10-CM | POA: Diagnosis not present

## 2016-12-04 DIAGNOSIS — D649 Anemia, unspecified: Secondary | ICD-10-CM | POA: Diagnosis not present

## 2016-12-05 DIAGNOSIS — E039 Hypothyroidism, unspecified: Secondary | ICD-10-CM | POA: Diagnosis not present

## 2016-12-05 DIAGNOSIS — E049 Nontoxic goiter, unspecified: Secondary | ICD-10-CM | POA: Diagnosis not present

## 2016-12-05 LAB — CBC WITH DIFFERENTIAL/PLATELET
Basophils Absolute: 48 cells/uL (ref 0–200)
Basophils Relative: 0.6 %
Eosinophils Absolute: 320 cells/uL (ref 15–500)
Eosinophils Relative: 4 %
HCT: 38.6 % (ref 35.0–45.0)
Hemoglobin: 13.2 g/dL (ref 11.7–15.5)
Lymphs Abs: 2392 cells/uL (ref 850–3900)
MCH: 30.4 pg (ref 27.0–33.0)
MCHC: 34.2 g/dL (ref 32.0–36.0)
MCV: 88.9 fL (ref 80.0–100.0)
MPV: 10.7 fL (ref 7.5–12.5)
Monocytes Relative: 9 %
Neutro Abs: 4520 cells/uL (ref 1500–7800)
Neutrophils Relative %: 56.5 %
Platelets: 230 10*3/uL (ref 140–400)
RBC: 4.34 10*6/uL (ref 3.80–5.10)
RDW: 14 % (ref 11.0–15.0)
Total Lymphocyte: 29.9 %
WBC mixed population: 720 cells/uL (ref 200–950)
WBC: 8 10*3/uL (ref 3.8–10.8)

## 2016-12-05 LAB — HEMOGLOBIN A1C
Hgb A1c MFr Bld: 5.8 % of total Hgb — ABNORMAL HIGH (ref <5.7)
Mean Plasma Glucose: 120 (calc)
eAG (mmol/L): 6.6 (calc)

## 2016-12-05 LAB — COMPLETE METABOLIC PANEL WITH GFR
AG Ratio: 1.5 (calc) (ref 1.0–2.5)
ALT: 8 U/L (ref 6–29)
AST: 10 U/L (ref 10–35)
Albumin: 3.9 g/dL (ref 3.6–5.1)
Alkaline phosphatase (APISO): 71 U/L (ref 33–130)
BUN: 19 mg/dL (ref 7–25)
CO2: 29 mmol/L (ref 20–32)
Calcium: 9.3 mg/dL (ref 8.6–10.4)
Chloride: 102 mmol/L (ref 98–110)
Creat: 0.8 mg/dL (ref 0.50–0.99)
GFR, Est African American: 88 mL/min/{1.73_m2} (ref >=60)
GFR, Est Non African American: 76 mL/min/{1.73_m2} (ref >=60)
Globulin: 2.6 g/dL (calc) (ref 1.9–3.7)
Glucose, Bld: 107 mg/dL — ABNORMAL HIGH (ref 65–99)
Potassium: 4.3 mmol/L (ref 3.5–5.3)
Sodium: 140 mmol/L (ref 135–146)
Total Bilirubin: 0.5 mg/dL (ref 0.2–1.2)
Total Protein: 6.5 g/dL (ref 6.1–8.1)

## 2016-12-05 LAB — LIPID PANEL
Cholesterol: 221 mg/dL — ABNORMAL HIGH (ref <200)
HDL: 52 mg/dL (ref >50)
Non-HDL Cholesterol (Calc): 169 mg/dL (calc) — ABNORMAL HIGH (ref <130)
Total CHOL/HDL Ratio: 4.3 (calc) (ref <5.0)
Triglycerides: 536 mg/dL — ABNORMAL HIGH (ref <150)

## 2016-12-05 LAB — INSULIN, RANDOM: Insulin: 16.7 u[IU]/mL (ref 2.0–19.6)

## 2016-12-06 ENCOUNTER — Ambulatory Visit: Payer: Medicare Other | Admitting: Family Medicine

## 2016-12-06 DIAGNOSIS — M48061 Spinal stenosis, lumbar region without neurogenic claudication: Secondary | ICD-10-CM | POA: Diagnosis not present

## 2016-12-06 DIAGNOSIS — Z6835 Body mass index (BMI) 35.0-35.9, adult: Secondary | ICD-10-CM | POA: Diagnosis not present

## 2016-12-06 DIAGNOSIS — I10 Essential (primary) hypertension: Secondary | ICD-10-CM | POA: Diagnosis not present

## 2016-12-06 DIAGNOSIS — M5126 Other intervertebral disc displacement, lumbar region: Secondary | ICD-10-CM | POA: Diagnosis not present

## 2016-12-06 DIAGNOSIS — R52 Pain, unspecified: Secondary | ICD-10-CM | POA: Diagnosis not present

## 2016-12-06 DIAGNOSIS — M48062 Spinal stenosis, lumbar region with neurogenic claudication: Secondary | ICD-10-CM | POA: Diagnosis not present

## 2016-12-06 DIAGNOSIS — M4316 Spondylolisthesis, lumbar region: Secondary | ICD-10-CM | POA: Diagnosis not present

## 2016-12-07 ENCOUNTER — Ambulatory Visit: Payer: Medicare Other | Admitting: Rheumatology

## 2016-12-11 ENCOUNTER — Encounter: Payer: Self-pay | Admitting: Rheumatology

## 2016-12-11 ENCOUNTER — Ambulatory Visit (INDEPENDENT_AMBULATORY_CARE_PROVIDER_SITE_OTHER): Payer: Medicare Other | Admitting: Rheumatology

## 2016-12-11 VITALS — BP 135/55 | HR 72 | Resp 18 | Ht 64.0 in | Wt 217.0 lb

## 2016-12-11 DIAGNOSIS — F5101 Primary insomnia: Secondary | ICD-10-CM | POA: Diagnosis not present

## 2016-12-11 DIAGNOSIS — M533 Sacrococcygeal disorders, not elsewhere classified: Secondary | ICD-10-CM

## 2016-12-11 DIAGNOSIS — M797 Fibromyalgia: Secondary | ICD-10-CM | POA: Diagnosis not present

## 2016-12-11 DIAGNOSIS — R5383 Other fatigue: Secondary | ICD-10-CM

## 2016-12-11 DIAGNOSIS — I209 Angina pectoris, unspecified: Secondary | ICD-10-CM

## 2016-12-11 MED ORDER — DICLOFENAC SODIUM 1 % TD GEL: 4.0000 g | Freq: Three times a day (TID) | TRANSDERMAL | 1 refills | Status: AC | PRN

## 2016-12-11 MED ORDER — LIDOCAINE HCL 1 % IJ SOLN
1.0000 mL | INTRAMUSCULAR | Status: AC | PRN
  Administered 2016-12-11: 1 mL

## 2016-12-11 MED ORDER — TRIAMCINOLONE ACETONIDE 40 MG/ML IJ SUSP
40.0000 mg | INTRAMUSCULAR | Status: AC | PRN
  Administered 2016-12-11: 40 mg via INTRA_ARTICULAR

## 2016-12-11 MED ORDER — GABAPENTIN 300 MG PO CAPS: 900.0000 mg | ORAL_CAPSULE | Freq: Three times a day (TID) | ORAL | 0 refills | Status: DC
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
## 2016-12-12 ENCOUNTER — Ambulatory Visit: Payer: Medicare Other

## 2016-12-12 ENCOUNTER — Ambulatory Visit: Payer: Medicare Other | Admitting: Family Medicine

## 2016-12-12 DIAGNOSIS — M48062 Spinal stenosis, lumbar region with neurogenic claudication: Secondary | ICD-10-CM | POA: Diagnosis not present

## 2016-12-13 DIAGNOSIS — E039 Hypothyroidism, unspecified: Secondary | ICD-10-CM | POA: Diagnosis not present

## 2016-12-13 DIAGNOSIS — E049 Nontoxic goiter, unspecified: Secondary | ICD-10-CM | POA: Diagnosis not present

## 2016-12-13 DIAGNOSIS — F411 Generalized anxiety disorder: Secondary | ICD-10-CM | POA: Diagnosis not present

## 2016-12-13 DIAGNOSIS — I10 Essential (primary) hypertension: Secondary | ICD-10-CM | POA: Diagnosis not present

## 2016-12-13 DIAGNOSIS — M150 Primary generalized (osteo)arthritis: Secondary | ICD-10-CM | POA: Diagnosis not present

## 2016-12-13 DIAGNOSIS — M810 Age-related osteoporosis without current pathological fracture: Secondary | ICD-10-CM | POA: Diagnosis not present

## 2016-12-18 ENCOUNTER — Telehealth: Payer: Self-pay

## 2016-12-18 NOTE — Telephone Encounter (Signed)
Received a prior authorization for Gabapentin from [employer]). Authorization was faxed to [phone]. Will update once we receive a response.  Katie Faraone, [place], CPhT 10:20 AM

## 2016-12-19 ENCOUNTER — Ambulatory Visit: Payer: Medicare Other | Admitting: Family Medicine

## 2016-12-19 DIAGNOSIS — H02889 Meibomian gland dysfunction of unspecified eye, unspecified eyelid: Secondary | ICD-10-CM | POA: Diagnosis not present

## 2016-12-20 ENCOUNTER — Telehealth: Payer: Self-pay

## 2016-12-20 DIAGNOSIS — M2550 Pain in unspecified joint: Secondary | ICD-10-CM

## 2016-12-20 NOTE — Telephone Encounter (Signed)
Patient's husband was calling to see if patient could come in to have lab work done to check for arthritis.  Cb# is [phone].  Please advise.  Thank you.

## 2016-12-20 NOTE — Telephone Encounter (Signed)
Received a fax from [employer] regarding the prior authorization for gabapentin. It states that the PA was received however, the qty requested does not require a PA as it does not exceed the standard allowance of 3600 mg limit per day.   Will send document to scan center.   Called patient to update. Patient voices understanding and denies any questions at this time.  Suzannah Bettes, [place], CPhT 8:23 AM

## 2016-12-21 NOTE — Addendum Note (Signed)
Addended by: Carole Binning on: 12/21/2016 11:18 AM   Modules accepted: Orders

## 2016-12-21 NOTE — Telephone Encounter (Signed)
RF, anti-CCP, ANA, uric acid

## 2016-12-21 NOTE — Telephone Encounter (Signed)
Patient advised she could have labs drawn and order placed for labs.

## 2016-12-29 ENCOUNTER — Other Ambulatory Visit: Payer: Self-pay | Admitting: Otolaryngology

## 2016-12-29 DIAGNOSIS — R1310 Dysphagia, unspecified: Secondary | ICD-10-CM

## 2016-12-29 DIAGNOSIS — E041 Nontoxic single thyroid nodule: Secondary | ICD-10-CM | POA: Diagnosis not present

## 2016-12-29 DIAGNOSIS — R1314 Dysphagia, pharyngoesophageal phase: Secondary | ICD-10-CM | POA: Diagnosis not present

## 2016-12-31 ENCOUNTER — Other Ambulatory Visit: Payer: Self-pay | Admitting: Family Medicine

## 2017-01-02 ENCOUNTER — Ambulatory Visit
Admission: RE | Admit: 2017-01-02 | Discharge: 2017-01-02 | Disposition: A | Discharge status: Home / Self Care | Payer: Medicare Other | Source: Ambulatory Visit | Attending: Otolaryngology | Admitting: Otolaryngology

## 2017-01-02 DIAGNOSIS — R1310 Dysphagia, unspecified: Secondary | ICD-10-CM

## 2017-01-03 DIAGNOSIS — E041 Nontoxic single thyroid nodule: Secondary | ICD-10-CM | POA: Diagnosis not present

## 2017-01-11 ENCOUNTER — Encounter: Payer: Self-pay | Admitting: Family Medicine

## 2017-01-11 ENCOUNTER — Ambulatory Visit (INDEPENDENT_AMBULATORY_CARE_PROVIDER_SITE_OTHER): Payer: Medicare Other | Admitting: Family Medicine

## 2017-01-11 ENCOUNTER — Other Ambulatory Visit: Payer: Self-pay | Admitting: Family Medicine

## 2017-01-11 VITALS — BP 140/70 | HR 78 | Resp 12 | Ht 64.0 in | Wt 221.6 lb

## 2017-01-11 DIAGNOSIS — I209 Angina pectoris, unspecified: Secondary | ICD-10-CM

## 2017-01-11 DIAGNOSIS — I10 Essential (primary) hypertension: Secondary | ICD-10-CM | POA: Diagnosis not present

## 2017-01-11 DIAGNOSIS — K219 Gastro-esophageal reflux disease without esophagitis: Secondary | ICD-10-CM

## 2017-01-11 DIAGNOSIS — Z23 Encounter for immunization: Secondary | ICD-10-CM

## 2017-01-11 DIAGNOSIS — F3160 Bipolar disorder, current episode mixed, unspecified: Secondary | ICD-10-CM | POA: Diagnosis not present

## 2017-01-11 DIAGNOSIS — E785 Hyperlipidemia, unspecified: Secondary | ICD-10-CM

## 2017-01-11 DIAGNOSIS — M3500 Sicca syndrome, unspecified: Secondary | ICD-10-CM

## 2017-01-11 DIAGNOSIS — E8881 Metabolic syndrome: Secondary | ICD-10-CM

## 2017-01-11 DIAGNOSIS — M797 Fibromyalgia: Secondary | ICD-10-CM

## 2017-01-11 MED ORDER — OLMESARTAN MEDOXOMIL-HCTZ 40-12.5 MG PO TABS: 1.0000 | ORAL_TABLET | Freq: Every day | ORAL | 1 refills | Status: DC

## 2017-01-11 MED ORDER — DIVALPROEX SODIUM 250 MG PO DR TAB: 250.0000 mg | DELAYED_RELEASE_TABLET | Freq: Two times a day (BID) | ORAL | 1 refills | Status: DC

## 2017-01-11 MED ORDER — LANSOPRAZOLE 30 MG PO CPDR: 30.0000 mg | DELAYED_RELEASE_CAPSULE | Freq: Every day | ORAL | 1 refills | Status: AC

## 2017-01-11 MED ORDER — METFORMIN HCL ER (MOD) 500 MG PO TB24: 500.0000 mg | ORAL_TABLET | Freq: Every day | ORAL | 1 refills | Status: DC

## 2017-01-11 MED ORDER — METFORMIN HCL ER (OSM) 500 MG PO TB24: 500.0000 mg | ORAL_TABLET | Freq: Every day | ORAL | 1 refills | Status: DC

## 2017-01-11 NOTE — Progress Notes (Signed)
Name: Jacqueline Lucas   MRN: [id number]    DOB: [birth year]   Date:01/11/2017       Progress Note  Subjective  Chief Complaint  Chief Complaint  Patient presents with  . Hypertension  . Hyperlipidemia  . Depression  . Anxiety    HPI  HTN: she is taking Benicar - 40-12.5, mteoprolol,and is doing well, no recent chest pain or palpitation. She has been gradually gaining weight and bp is well controlled today - checks BP aat home and highest has been 140's/80's, 130's/70's. She has  some shortness of breath due to COPD.  No BLE swelling or orthopnea  Bipolar disorder: she is doing well on Depakote and Effexor, denies mania, sleeping better - 6-8 hours per night.  When she goes out of the home and into a store, she has noticed that she feels like she is in a tunnel - has had a panic attack in [place] a while ago and this feels similar. She denies any episodes of mania  Migraine headaches: used to go to the headache clinic, but states she was taking too many medications. Migraines were under control however got worse and we referred her to neurologist at [facility].  Migraines are now occurring twice a week and lasting 1-2 hours an episodes.  She is taking Gabapentin, off oxycodone  Gabapentin Rx'd by her Rheumatologist.  FMS: seeing Dr. Olegario Messier on muscle relaxer and gabapentin. She states symptoms worse when there is cold weather or rainy. She still aches all over pain 4/10  Dyslipidemia: currently on lipitor only, off Tricor. No issues on medication at this time.   Goiter: having dysphagia, and is contemplating surgery, she was seen by Dr. Ronnald Collum and also Dr. Richardson Landry ( ENT), she states he referred her to GI at [facility] for further evaluation of dysphagia and is waiting for appointment. [name]   COPD mild: from second hand smoking, taking Dulera and singulair. She has occasional cough and wheezing, but doing well at this time.  Back Pain: Has started to worsen over the  last 4-5 months, she is seeing Dr. Retia Passe in [place] [employer], [place] next week.   Angina/ History of SVT: doing well, on metoprolol, statin, and aspirin, sees Dr. Clayborn Bigness yearly. Not currently having any problems  Sicca: sees Rheumatologist   Obesity & Hyperglycemia: she has hyperglycemia./insuilin resistance, we gave her rx of metformin but not covered by insurance so she did not start it yet, we will sent a different formulation.    Patient Active Problem List   Diagnosis Date Noted  . Arthritis of carpometacarpal [facility]) joint of left thumb 05/12/2016  . Bilateral thumb pain 05/01/2016  . Chronic bronchitis ([place]) 04/14/2016  . Anemia 02/03/2016  . Status post bilateral total hip replacement 01/10/2016  . Sicca ([place]) 01/10/2016  . Age related osteoporosis 01/10/2016  . Hip arthritis 08/16/2015  . Microscopic hematuria 07/30/2015  . Vaginal atrophy 07/30/2015  . GAD (generalized anxiety disorder) 07/08/2015  . Moderate episode of recurrent major depressive disorder ([place]) 07/08/2015  . Migraine with aura and without status migrainosus, not intractable 07/08/2015  . Dyslipidemia 07/08/2015  . Degenerative arthritis of right knee 03/16/2015  . Atrophic vaginitis 01/31/2015  . Recurrent UTI 12/31/2014  . IBS (irritable bowel syndrome) 10/29/2014  . Asthma, moderate persistent 09/08/2014  . Allergic state 09/07/2014  . Colon polyp 09/07/2014  . Hemorrhoid 09/07/2014  . Constrictive tenosynovitis 03/12/2014  . H/O total knee replacement, bilateral 07/31/2013  . Angina pectoris ([place]) 11/28/2012  .  Anxiety 11/28/2012  . Acid reflux 11/28/2012  . Cardiac murmur 11/28/2012  . BP (high blood pressure) 11/28/2012  . Cannot sleep 11/28/2012  . Adiposity 11/28/2012  . Arthritis, degenerative 11/28/2012  . Restless leg 11/28/2012  . Supraventricular tachycardia ([place]) 11/28/2012  . Fibromyalgia 11/28/2012    Past Surgical History:  Procedure Laterality Date  . ABDOMINAL  HYSTERECTOMY     due to cancer of ovaries  . BACK SURGERY N/A 2014   x 5  . CARDIAC CATHETERIZATION     no PCI; 12/18/12 ([facility], Dr. Marcello Moores): EP study with ablation. No coronary angiography done then.  . COLONOSCOPY W/ POLYPECTOMY    . COLONOSCOPY WITH PROPOFOL N/A 02/04/2016   Procedure: COLONOSCOPY WITH PROPOFOL;  Surgeon: Robert Bellow, MD;  Location: [facility] ENDOSCOPY;  Service: Endoscopy;  Laterality: N/A;  . ESOPHAGOGASTRODUODENOSCOPY (EGD) WITH PROPOFOL N/A 02/04/2016   Procedure: ESOPHAGOGASTRODUODENOSCOPY (EGD) WITH PROPOFOL;  Surgeon: Robert Bellow, MD;  Location: [facility] ENDOSCOPY;  Service: Endoscopy;  Laterality: N/A;  . FINGER ARTHROSCOPY WITH CARPOMETACARPEL ([place]) ARTHROPLASTY Left 05/01/2016   Dr. Erlinda Hong  . GIVENS CAPSULE STUDY N/A 05/02/2016   Procedure: GIVENS CAPSULE STUDY;  Surgeon: Manya Silvas, MD;  Location: [facility] ENDOSCOPY;  Service: Endoscopy;  Laterality: N/A;  . HAND SURGERY    . HEMORRHOID SURGERY    . HIP ARTHROPLASTY    . JOINT REPLACEMENT Bilateral    knee  . KNEE ARTHROPLASTY    . REPLACEMENT TOTAL KNEE Left   . SPINE SURGERY     x 2  . tendonititis     right elbow, right wrist  . TOTAL HIP ARTHROPLASTY Left 2013  . TOTAL HIP ARTHROPLASTY Right 08/16/2015   Procedure: TOTAL HIP ARTHROPLASTY ANTERIOR APPROACH;  Surgeon: Meredith Pel, MD;  Location: [place];  Service: Orthopedics;  Laterality: Right;  . TOTAL KNEE ARTHROPLASTY Right 03/16/2015   Procedure: RIGHT TOTAL KNEE ARTHROPLASTY, PCL SACRIFICING;  Surgeon: Meredith Pel, MD;  Location: [place];  Service: Orthopedics;  Laterality: Right;    Family History  Problem Relation Age of Onset  . Diabetes Mother   . Hypertension Mother   . Heart disease Mother   . Diabetes Father   . Cancer Father   . Hypertension Sister   . Depression Sister   . Cancer Brother   . Heart disease Brother   . Bladder Cancer Neg Hx   . Kidney disease Neg Hx   . Kidney cancer Neg Hx     Social History    Socioeconomic History  . Marital status: Married    Spouse name: Not on file  . Number of children: Not on file  . Years of education: Not on file  . Highest education level: Not on file  Social Needs  . Financial resource strain: Not on file  . Food insecurity - worry: Not on file  . Food insecurity - inability: Not on file  . Transportation needs - medical: Not on file  . Transportation needs - non-medical: Not on file  Occupational History  . Not on file  Tobacco Use  . Smoking status: Never Smoker  . Smokeless tobacco: Never Used  Substance and Sexual Activity  . Alcohol use: No    Alcohol/week: 0.0 oz  . Drug use: No  . Sexual activity: No  Other Topics Concern  . Not on file  Social History Narrative  . Not on file     Current Outpatient Medications:  .  aspirin 81 MG chewable  tablet, Chew 81 mg by mouth daily., Disp: , Rfl:  .  atorvastatin (LIPITOR) 40 MG tablet, Take 1 tablet (40 mg total) by mouth at bedtime., Disp: 90 tablet, Rfl: 1 .  baclofen (LIORESAL) 10 MG tablet, TAKE ONE TABLET AT 7AM AND ONE TABLET AT 2PM AS NEEDED FOR MUSCLE SPASM, Disp: 180 tablet, Rfl: 1 .  Cholecalciferol (D3-1000) 1000 units tablet, Take 1,000 Units by mouth at bedtime. , Disp: , Rfl:  .  cycloSPORINE (RESTASIS) 0.05 % ophthalmic emulsion, Place 1 drop into both eyes 2 (two) times daily. , Disp: , Rfl:  .  diclofenac sodium (VOLTAREN) 1 % GEL, Apply 3 g topically 3 (three) times daily as needed., Disp: , Rfl:  .  diclofenac sodium (VOLTAREN) 1 % GEL, Apply 4 g topically 3 (three) times daily as needed. Voltaren Gel 4 grams to 3 large joints upto TID prn 10 TUBES with 1 refills, Disp: 10 Tube, Rfl: 1 .  divalproex (DEPAKOTE) 250 MG DR tablet, Take 1 tablet (250 mg total) 2 (two) times daily by mouth., Disp: 180 tablet, Rfl: 1 .  estradiol (ESTRACE VAGINAL) 0.1 MG/GM vaginal cream, Apply 0.5mg  (pea-sized amount)  just inside the vaginal introitus with a finger-tip every night for two  weeks and then Monday, Wednesday and Friday nights., Disp: 30 g, Rfl: 12 .  gabapentin (NEURONTIN) 300 MG capsule, Take 3 capsules (900 mg total) by mouth 3 (three) times daily., Disp: 810 capsule, Rfl: 0 .  ibuprofen (ADVIL,MOTRIN) 600 MG tablet, 600 mg 2 (two) times daily. , Disp: , Rfl:  .  lansoprazole (PREVACID) 30 MG capsule, Take 1 capsule (30 mg total) daily by mouth., Disp: 90 capsule, Rfl: 1 .  levalbuterol (XOPENEX) 1.25 MG/3ML nebulizer solution, Take 1.25 mg by nebulization every 4 (four) hours as needed for wheezing., Disp: 72 mL, Rfl: 4 .  levothyroxine (SYNTHROID) 88 MCG tablet, Take 88 mcg by mouth daily before breakfast. , Disp: , Rfl:  .  metoprolol tartrate (LOPRESSOR) 25 MG tablet, Take 1 tablet (25 mg total) by mouth 2 (two) times daily., Disp: 180 tablet, Rfl: 1 .  mometasone-formoterol (DULERA) 200-5 MCG/ACT AERO, Inhale 2 puffs into the lungs 2 (two) times daily. Reported on 07/28/2015, Disp: 3 Inhaler, Rfl: 0 .  montelukast (SINGULAIR) 10 MG tablet, Take 1 tablet (10 mg total) by mouth at bedtime., Disp: 90 tablet, Rfl: 1 .  olmesartan-hydrochlorothiazide (BENICAR HCT) 40-12.5 MG tablet, Take 1 tablet daily by mouth., Disp: 90 tablet, Rfl: 1 .  ondansetron (ZOFRAN) 4 MG tablet, Take 1 tablet (4 mg total) by mouth every 8 (eight) hours as needed for nausea or vomiting., Disp: 20 tablet, Rfl: 0 .  polyethylene glycol powder (GLYCOLAX/MIRALAX) powder, 255 grams one bottle for capsule endoscopy prep, Disp: 255 g, Rfl: 0 .  Tetrahydrozoline HCl (EYE DROPS OP), Apply to eye., Disp: , Rfl:  .  valsartan-hydrochlorothiazide (DIOVAN-HCT) 160-12.5 MG tablet, Take by mouth., Disp: , Rfl:  .  venlafaxine XR (EFFEXOR-XR) 150 MG 24 hr capsule, TAKE ONE CAPSULE BY MOUTH EVERY DAY WITH BREAKFAST, Disp: 90 capsule, Rfl: 0 .  vitamin B-12 (CYANOCOBALAMIN) 1000 MCG tablet, Take 1,000 mcg by mouth at bedtime., Disp: , Rfl:  .  magnesium oxide (MAG-OX) 400 MG tablet, Take 400 mg by mouth at  bedtime. Reported on 07/28/2015, Disp: , Rfl:  .  metformin (FORTAMET) 500 MG (OSM) 24 hr tablet, Take 1 tablet (500 mg total) daily with breakfast by mouth., Disp: 90 tablet, Rfl: 1  Allergies  Allergen Reactions  . Morphine Nausea And Vomiting    Ok with nausea medication  . Diazepam Nausea And Vomiting  . Lyrica [Pregabalin] Other (See Comments)    Joint swelling  . Latex Rash  . Rash Away  [Petrolatum-Zinc Oxide] Rash and Swelling  . Salicylates Other (See Comments)    Other reaction(s): Headache  . Tape Rash    Please use paper tape     ROS  Ten systems reviewed and is negative except as mentioned in HPI   Objective  Vitals:   01/11/17 1136  BP: 140/70  Pulse: 78  Resp: 12  SpO2: 96%  Weight: 221 lb 9.6 oz (100.5 kg)  Height: 5\' 4"  (1.626 m)    Body mass index is 38.04 kg/m.  Physical Exam  Constitutional: Patient appears well-developed and well-nourished. Obese  No distress.  HEENT: head atraumatic, normocephalic, pupils equal and reactive to light, neck supple, throat within normal limits Cardiovascular: Normal rate, regular rhythm and normal heart sounds.  No murmur heard. No BLE edema. Pulmonary/Chest: Effort normal and breath sounds normal. No respiratory distress. Abdominal: Soft.  There is no tenderness. Psychiatric: Patient has a normal mood and affect. behavior is normal. Judgment and thought content normal. Muscular skeletal: trigger points positive   Recent Results (from the past 2160 hour(s))  Urinalysis, Complete     Status: None   Collection Time: 11/29/16  2:35 PM  Result Value Ref Range   Scan Result 0   Urinalysis, Complete     Status: Abnormal   Collection Time: 11/29/16  2:35 PM  Result Value Ref Range   Specific Gravity, UA 1.025 1.005 - 1.030   pH, UA 6.5 5.0 - 7.5   Color, UA Yellow Yellow   Appearance Ur Cloudy (A) Clear   Leukocytes, UA 1+ (A) Negative   Protein, UA 2+ (A) Negative/Trace   Glucose, UA Negative Negative    Ketones, UA 1+ (A) Negative   RBC, UA Negative Negative   Bilirubin, UA Negative Negative   Urobilinogen, Ur 1.0 0.2 - 1.0 mg/dL   Nitrite, UA Negative Negative   Microscopic Examination See below:   Microscopic Examination     Status: Abnormal   Collection Time: 11/29/16  2:35 PM  Result Value Ref Range   WBC, UA 0-5 0 - 5 /hpf   RBC, UA None seen 0 - 2 /hpf   Epithelial Cells (non renal) 0-10 0 - 10 /hpf   Mucus, UA Present (A) Not Estab.   Bacteria, UA Few (A) None seen/Few  Hemoglobin A1c     Status: Abnormal   Collection Time: 12/04/16  8:57 AM  Result Value Ref Range   Hgb A1c MFr Bld 5.8 (H) <5.7 % of total Hgb    Comment: For someone without known diabetes, a hemoglobin  A1c value between 5.7% and 6.4% is consistent with prediabetes and should be confirmed with a  follow-up test. . For someone with known diabetes, a value <7% indicates that their diabetes is well controlled. A1c targets should be individualized based on duration of diabetes, age, comorbid conditions, and other considerations. . This assay result is consistent with an increased risk of diabetes. . Currently, no consensus exists regarding use of hemoglobin A1c for diagnosis of diabetes for children. .    Mean Plasma Glucose 120 (calc)   eAG (mmol/L) 6.6 (calc)  Lipid panel     Status: Abnormal   Collection Time: 12/04/16  8:57 AM  Result Value Ref Range  Cholesterol 221 (H) <200 mg/dL   HDL 52 >50 mg/dL   Triglycerides 536 (H) <150 mg/dL   LDL Cholesterol (Calc)  mg/dL (calc)    Comment: . LDL cholesterol not calculated. Triglyceride levels greater than 400 mg/dL invalidate calculated LDL results. . Reference range: <100 . Desirable range <100 mg/dL for primary prevention;   <70 mg/dL for patients with CHD or diabetic patients  with > or = 2 CHD risk factors. [name] LDL-C is now calculated using the Martin-Hopkins  calculation, which is a validated novel method providing  better accuracy  than the Friedewald equation in the  estimation of LDL-C.  Cresenciano Genre et al. Annamaria Helling. 4097;353(29): 2061-2068  (http://education.[employer].com/faq/FAQ164)    Total CHOL/HDL Ratio 4.3 <5.0 (calc)   Non-HDL Cholesterol (Calc) 169 (H) <130 mg/dL (calc)    Comment: For patients with diabetes plus 1 major ASCVD risk  factor, treating to a non-HDL-C goal of <100 mg/dL  (LDL-C of <70 mg/dL) is considered a therapeutic  option.   COMPLETE METABOLIC PANEL WITH GFR     Status: Abnormal   Collection Time: 12/04/16  8:57 AM  Result Value Ref Range   Glucose, Bld 107 (H) 65 - 99 mg/dL    Comment: .            Fasting reference interval . For someone without known diabetes, a glucose value between 100 and 125 mg/dL is consistent with prediabetes and should be confirmed with a follow-up test. .    BUN 19 7 - 25 mg/dL   Creat 0.80 0.50 - 0.99 mg/dL    Comment: For patients >51 years of age, the reference limit for Creatinine is approximately 13% higher for people identified as African-American. .    GFR, Est Non African American 76 > OR = 60 mL/min/1.34m2   GFR, Est African American 88 > OR = 60 mL/min/1.23m2   BUN/Creatinine Ratio NOT APPLICABLE 6 - 22 (calc)   Sodium 140 135 - 146 mmol/L   Potassium 4.3 3.5 - 5.3 mmol/L   Chloride 102 98 - 110 mmol/L   CO2 29 20 - 32 mmol/L   Calcium 9.3 8.6 - 10.4 mg/dL   Total Protein 6.5 6.1 - 8.1 g/dL   Albumin 3.9 3.6 - 5.1 g/dL   Globulin 2.6 1.9 - 3.7 g/dL (calc)   AG Ratio 1.5 1.0 - 2.5 (calc)   Total Bilirubin 0.5 0.2 - 1.2 mg/dL   Alkaline phosphatase (APISO) 71 33 - 130 U/L   AST 10 10 - 35 U/L   ALT 8 6 - 29 U/L  CBC with Differential/Platelet     Status: None   Collection Time: 12/04/16  8:57 AM  Result Value Ref Range   WBC 8.0 3.8 - 10.8 Thousand/uL   RBC 4.34 3.80 - 5.10 Million/uL   Hemoglobin 13.2 11.7 - 15.5 g/dL   HCT 38.6 35.0 - 45.0 %   MCV 88.9 80.0 - 100.0 fL   MCH 30.4 27.0 - 33.0 pg   MCHC 34.2 32.0 - 36.0 g/dL    RDW 14.0 11.0 - 15.0 %   Platelets 230 140 - 400 Thousand/uL   MPV 10.7 7.5 - 12.5 fL   Neutro Abs 4,520 1,500 - 7,800 cells/uL   Lymphs Abs 2,392 850 - 3,900 cells/uL   WBC mixed population 720 200 - 950 cells/uL   Eosinophils Absolute 320 15 - 500 cells/uL   Basophils Absolute 48 0 - 200 cells/uL   Neutrophils Relative % 56.5 %  Total Lymphocyte 29.9 %   Monocytes Relative 9.0 %   Eosinophils Relative 4.0 %   Basophils Relative 0.6 %  Insulin, random     Status: None   Collection Time: 12/04/16  8:57 AM  Result Value Ref Range   Insulin 16.7 2.0 - 19.6 uIU/mL    Comment: This insulin assay shows strong cross-reactivity for some insulin analogs (lispro, aspart, and glargine) and much lower cross-reactivity with others (detemir, glulisine).      PHQ2/9: Depression screen [facility] 2/9 01/11/2017 07/12/2016 04/14/2016 01/11/2016 12/10/2015  Decreased Interest 0 0 0 0 0  Down, Depressed, Hopeless 0 0 0 0 0  PHQ - 2 Score 0 0 0 0 0  Altered sleeping 2 - - - -  Tired, decreased energy 1 - - - -  Change in appetite 0 - - - -  Feeling bad or failure about yourself  0 - - - -  Trouble concentrating 0 - - - -  Moving slowly or fidgety/restless 0 - - - -  Suicidal thoughts 0 - - - -  PHQ-9 Score 3 - - - -  Difficult doing work/chores Somewhat difficult - - - -    Fall Risk: Fall Risk  01/11/2017 07/12/2016 04/14/2016 01/11/2016 12/10/2015  Falls in the past year? No Yes Yes Yes No  Number falls in past yr: - 2 or more 1 1 -  Injury with Fall? - No Yes Yes -  Risk Factor Category  - - - High Fall Risk -  Risk for fall due to : - - - - -  Follow up - - - Falls evaluation completed -     Functional Status Survey: Is the patient deaf or have difficulty hearing?: No Does the patient have difficulty seeing, even when wearing glasses/contacts?: No Does the patient have difficulty concentrating, remembering, or making decisions?: No Does the patient have difficulty walking or  climbing stairs?: No Does the patient have difficulty dressing or bathing?: No Does the patient have difficulty doing errands alone such as visiting a doctor's office or shopping?: No    Assessment & Plan  1. Essential hypertension  - olmesartan-hydrochlorothiazide (BENICAR HCT) 40-12.5 MG tablet; Take 1 tablet daily by mouth.  Dispense: 90 tablet; Refill: 1  2. Flu vaccine need  - Flu vaccine HIGH DOSE PF  3. Bipolar 1 disorder, mixed (HCC)  Mood has been stable - divalproex (DEPAKOTE) 250 MG DR tablet; Take 1 tablet (250 mg total) 2 (two) times daily by mouth.  Dispense: 180 tablet; Refill: 1  4. Gastroesophageal reflux disease without esophagitis  Insurance is denying BID dosing, she agrees in trying daily dose and add Tums or Ranitidine at night - lansoprazole (PREVACID) 30 MG capsule; Take 1 capsule (30 mg total) daily by mouth.  Dispense: 90 capsule; Refill: 1  5. Insulin resistance  She could not afford Fortamet, we will try a different formulation today  Metformin 500 mg ER daily #90 and one refill   6. Need for vaccination for Strep pneumoniae  - Pneumococcal polysaccharide vaccine 23-valent greater than or equal to 2yo subcutaneous/IM  7. Dyslipidemia  On statin therapy   8. Fibromyalgia  Going to [facility]   9. Sicca, unspecified type ([place])  Seeing Rheumatologist, also having severe dry eyes, advised by ophthalmologist to take fish oil and she has not noticed much difference yet

## 2017-01-12 ENCOUNTER — Other Ambulatory Visit: Payer: Self-pay | Admitting: Family Medicine

## 2017-01-12 DIAGNOSIS — F3160 Bipolar disorder, current episode mixed, unspecified: Secondary | ICD-10-CM

## 2017-01-12 DIAGNOSIS — I10 Essential (primary) hypertension: Secondary | ICD-10-CM

## 2017-01-16 ENCOUNTER — Ambulatory Visit: Payer: Medicare Other | Admitting: Rheumatology

## 2017-01-17 ENCOUNTER — Other Ambulatory Visit: Payer: Self-pay | Admitting: Rheumatology

## 2017-01-17 DIAGNOSIS — H16223 Keratoconjunctivitis sicca, not specified as Sjogren's, bilateral: Secondary | ICD-10-CM | POA: Diagnosis not present

## 2017-01-23 ENCOUNTER — Ambulatory Visit: Payer: Medicare Other | Admitting: Rheumatology

## 2017-01-26 ENCOUNTER — Other Ambulatory Visit: Payer: Self-pay | Admitting: Family Medicine

## 2017-01-26 DIAGNOSIS — E785 Hyperlipidemia, unspecified: Secondary | ICD-10-CM

## 2017-02-01 ENCOUNTER — Other Ambulatory Visit: Payer: Self-pay | Admitting: Family Medicine

## 2017-02-01 DIAGNOSIS — F411 Generalized anxiety disorder: Secondary | ICD-10-CM

## 2017-02-01 DIAGNOSIS — F331 Major depressive disorder, recurrent, moderate: Secondary | ICD-10-CM

## 2017-02-01 NOTE — Telephone Encounter (Signed)
Refill request for general medication: Venlafaxine 150 mg 24 hr   Last office visit: 01/11/2017  Last physical exam: None indicated  Follow up visit: 04/13/2017

## 2017-02-02 ENCOUNTER — Other Ambulatory Visit: Payer: Self-pay | Admitting: Family Medicine

## 2017-02-02 DIAGNOSIS — F3160 Bipolar disorder, current episode mixed, unspecified: Secondary | ICD-10-CM

## 2017-02-19 ENCOUNTER — Other Ambulatory Visit: Payer: Self-pay | Admitting: Family Medicine

## 2017-02-19 DIAGNOSIS — F331 Major depressive disorder, recurrent, moderate: Secondary | ICD-10-CM

## 2017-02-19 DIAGNOSIS — F411 Generalized anxiety disorder: Secondary | ICD-10-CM

## 2017-03-06 DIAGNOSIS — R1310 Dysphagia, unspecified: Secondary | ICD-10-CM | POA: Diagnosis not present

## 2017-03-06 DIAGNOSIS — K219 Gastro-esophageal reflux disease without esophagitis: Secondary | ICD-10-CM | POA: Diagnosis not present

## 2017-03-22 ENCOUNTER — Telehealth: Payer: Self-pay | Admitting: Rheumatology

## 2017-03-22 MED ORDER — GABAPENTIN 300 MG PO CAPS: 900.0000 mg | ORAL_CAPSULE | Freq: Three times a day (TID) | ORAL | 0 refills | Status: DC

## 2017-03-22 NOTE — Telephone Encounter (Signed)
Last Visit: 12/11/16 Next Visit: 05/15/17  Okay to refill per Dr. Estanislado Pandy

## 2017-03-22 NOTE — Telephone Encounter (Signed)
Patient called requesting a prescription refill of Gabapentin.  Patient uses [facility].  Patient's CB# [phone]

## 2017-03-23 NOTE — Telephone Encounter (Signed)
Opened in error

## 2017-04-06 ENCOUNTER — Other Ambulatory Visit: Payer: Self-pay | Admitting: Family Medicine

## 2017-04-06 DIAGNOSIS — I10 Essential (primary) hypertension: Secondary | ICD-10-CM

## 2017-04-06 DIAGNOSIS — G43109 Migraine with aura, not intractable, without status migrainosus: Secondary | ICD-10-CM

## 2017-04-06 NOTE — Telephone Encounter (Signed)
Hypertension medication request:  01/11/2017   Last office visit pertaining to hypertension: Metoprolol  BP Readings from Last 3 Encounters:  01/11/17 140/70  12/11/16 (!) 135/55  11/29/16 (!) 147/76    Lab Results  Component Value Date   CREATININE 0.80 12/04/2016   BUN 19 12/04/2016   NA 140 12/04/2016   K 4.3 12/04/2016   CL 102 12/04/2016   CO2 29 12/04/2016     Follow up 04/13/17

## 2017-04-10 NOTE — Telephone Encounter (Signed)
Appt resolved

## 2017-04-11 ENCOUNTER — Other Ambulatory Visit: Payer: Self-pay | Admitting: Family Medicine

## 2017-04-11 DIAGNOSIS — J302 Other seasonal allergic rhinitis: Secondary | ICD-10-CM

## 2017-04-11 NOTE — Telephone Encounter (Signed)
Refill request for general medication: Singulair 10 mg tablet  Last office visit: 01/11/2017  Last physical exam: None indicated  Follow-up on file. 04/13/2017

## 2017-04-13 ENCOUNTER — Ambulatory Visit (INDEPENDENT_AMBULATORY_CARE_PROVIDER_SITE_OTHER): Payer: Medicare Other | Admitting: Family Medicine

## 2017-04-13 ENCOUNTER — Encounter: Payer: Self-pay | Admitting: Family Medicine

## 2017-04-13 VITALS — BP 140/60 | HR 79 | Resp 14 | Ht 64.0 in | Wt 230.0 lb

## 2017-04-13 DIAGNOSIS — I10 Essential (primary) hypertension: Secondary | ICD-10-CM | POA: Diagnosis not present

## 2017-04-13 DIAGNOSIS — F331 Major depressive disorder, recurrent, moderate: Secondary | ICD-10-CM | POA: Diagnosis not present

## 2017-04-13 DIAGNOSIS — F3160 Bipolar disorder, current episode mixed, unspecified: Secondary | ICD-10-CM

## 2017-04-13 DIAGNOSIS — M797 Fibromyalgia: Secondary | ICD-10-CM

## 2017-04-13 DIAGNOSIS — I209 Angina pectoris, unspecified: Secondary | ICD-10-CM

## 2017-04-13 DIAGNOSIS — G43109 Migraine with aura, not intractable, without status migrainosus: Secondary | ICD-10-CM | POA: Diagnosis not present

## 2017-04-13 DIAGNOSIS — R7303 Prediabetes: Secondary | ICD-10-CM

## 2017-04-13 DIAGNOSIS — F411 Generalized anxiety disorder: Secondary | ICD-10-CM

## 2017-04-13 DIAGNOSIS — E785 Hyperlipidemia, unspecified: Secondary | ICD-10-CM | POA: Diagnosis not present

## 2017-04-13 DIAGNOSIS — J410 Simple chronic bronchitis: Secondary | ICD-10-CM

## 2017-04-13 MED ORDER — METFORMIN HCL ER 750 MG PO TB24: 750.0000 mg | ORAL_TABLET | Freq: Every day | ORAL | 1 refills | Status: DC

## 2017-04-13 MED ORDER — OLMESARTAN MEDOXOMIL-HCTZ 40-12.5 MG PO TABS: 1.0000 | ORAL_TABLET | Freq: Every day | ORAL | 0 refills | Status: DC

## 2017-04-13 MED ORDER — VENLAFAXINE HCL ER 150 MG PO CP24: 150.0000 mg | ORAL_CAPSULE | Freq: Every day | ORAL | 1 refills | Status: DC

## 2017-04-13 MED ORDER — OMEGA-3-ACID ETHYL ESTERS 1 G PO CAPS: 2.0000 g | ORAL_CAPSULE | Freq: Every day | ORAL | 1 refills | Status: DC

## 2017-04-13 NOTE — Patient Instructions (Signed)
Obesity, Adult Obesity is having too much body fat. If you have a BMI of 30 or more, you are obese. BMI is a number that explains how much body fat you have. Obesity is often caused by taking in (consuming) more calories than your body uses. Obesity can cause serious health problems. Changing your lifestyle can help to treat obesity. Follow these instructions at home: Eating and drinking   Follow advice from your doctor about what to eat and drink. Your doctor may tell you to: ? Cut down on (limit) fast foods, sweets, and processed snack foods. ? Choose low-fat options. For example, choose low-fat milk instead of whole milk. ? Eat 5 or more servings of fruits or vegetables every day. ? Eat at home more often. This gives you more control over what you eat. ? Choose healthy foods when you eat out. ? Learn what a healthy portion size is. A portion size is the amount of a certain food that is healthy for you to eat at one time. This is different for each person. ? Keep low-fat snacks available. ? Avoid sugary drinks. These include soda, fruit juice, iced tea that is sweetened with sugar, and flavored milk. ? Eat a healthy breakfast.  Drink enough water to keep your pee (urine) clear or pale yellow.  Do not go without eating for long periods of time (do not fast).  Do not go on popular or trendy diets (fad diets). Physical Activity  Exercise often, as told by your doctor. Ask your doctor: ? What types of exercise are safe for you. ? How often you should exercise.  Warm up and stretch before being active.  Do slow stretching after being active (cool down).  Rest between times of being active. Lifestyle  Limit how much time you spend in front of your TV, computer, or video game system (be less sedentary).  Find ways to reward yourself that do not involve food.  Limit alcohol intake to no more than 1 drink a day for nonpregnant women and 2 drinks a day for men. One drink equals 12 oz  of beer, 5 oz of wine, or 1 oz of hard liquor. General instructions  Keep a weight loss journal. This can help you keep track of: ? The food that you eat. ? The exercise that you do.  Take over-the-counter and prescription medicines only as told by your doctor.  Take vitamins and supplements only as told by your doctor.  Think about joining a support group. Your doctor may be able to help with this.  Keep all follow-up visits as told by your doctor. This is important. Contact a doctor if:  You cannot meet your weight loss goal after you have changed your diet and lifestyle for 6 weeks. This information is not intended to replace advice given to you by your health care provider. Make sure you discuss any questions you have with your health care provider. Document Released: 05/08/2011 Document Revised: 07/22/2015 Document Reviewed: 12/02/2014 Elsevier Interactive Patient Education  2018 [employer]  

## 2017-04-13 NOTE — Progress Notes (Signed)
Name: Jacqueline Lucas   MRN: [id number]    DOB: 24-Oct-1950   Date:04/13/2017       Progress Note  Subjective  Chief Complaint  Chief Complaint  Patient presents with  . Hypertension  . Medication Refill    Singulair    HPI  HTN: she is takingBenicar - 40-12.5, mteoprolol,and is doing well, she has episodes of chest pain - sometimes feels like muscular pain, no palpitation. She has been gradually gaining weight and bp iswell controlled today. BP is at goal today   Bipolar disorder: she is doing well on Depakote and Effexor, denies mania, sleeping better - 6-8 hours per night.Husband thinks she is doing well. She feels like she is also doing better. No recent panic attacks.   Migraine headaches: used to go to the headache clinic, but states she was taking too many medications. Migraineswereunder control however got worseand we referred her to neurologist at [facility].Migraines are now occurring twice a week and lasting 1-2 hours an episodes.She is taking Gabapentin, off oxycodone Gabapentin Rx'd by her Neurologist . She also has episodes of cluster headaches intermittent   FMS: seeing Dr. Olegario Messier on muscle relaxer and gabapentin. She states symptoms worse with weather changes.  She still aches all over pain 4/10,  Dyslipidemia: currently on lipitor only, off Tricor but triglycerides very high, she is taking fish oil otc, and we will change to Lovaza instead   Goiter: having dysphagia, and is contemplating surgery, she was seen by Dr. Ronnald Collum , also seen by Dr. Richardson Landry , advised to hold off on surgery for now  COPD mild: from second hand smoking, taking Duleraand singulair.She has occasional cough and wheezing, but doing well at this time.  Back Pain: she missed visit with neurosurgeon, she will reschedule  Angina/ History of SVT: doing well, on metoprolol, statin, and aspirin, sees Dr. Clayborn Bigness yearly. Not currently having any problems  Obesity &  Hyperglycemia: she has hyperglycemia./insuilin resistance, we gave her rx of metformin but not covered by insurance so she did not start it yet, we will change to generic version    Patient Active Problem List   Diagnosis Date Noted  . Arthritis of carpometacarpal [facility]) joint of left thumb 05/12/2016  . Bilateral thumb pain 05/01/2016  . Chronic bronchitis ([place]) 04/14/2016  . Anemia 02/03/2016  . Status post bilateral total hip replacement 01/10/2016  . Sicca ([place]) 01/10/2016  . Age related osteoporosis 01/10/2016  . Hip arthritis 08/16/2015  . Microscopic hematuria 07/30/2015  . Vaginal atrophy 07/30/2015  . GAD (generalized anxiety disorder) 07/08/2015  . Moderate episode of recurrent major depressive disorder ([place]) 07/08/2015  . Migraine with aura and without status migrainosus, not intractable 07/08/2015  . Dyslipidemia 07/08/2015  . Degenerative arthritis of right knee 03/16/2015  . Atrophic vaginitis 01/31/2015  . Recurrent UTI 12/31/2014  . IBS (irritable bowel syndrome) 10/29/2014  . Asthma, moderate persistent 09/08/2014  . Allergic state 09/07/2014  . Colon polyp 09/07/2014  . Hemorrhoid 09/07/2014  . Constrictive tenosynovitis 03/12/2014  . H/O total knee replacement, bilateral 07/31/2013  . Angina pectoris ([place]) 11/28/2012  . Anxiety 11/28/2012  . Acid reflux 11/28/2012  . Cardiac murmur 11/28/2012  . BP (high blood pressure) 11/28/2012  . Cannot sleep 11/28/2012  . Adiposity 11/28/2012  . Arthritis, degenerative 11/28/2012  . Restless leg 11/28/2012  . Supraventricular tachycardia ([place]) 11/28/2012  . Fibromyalgia 11/28/2012    Past Surgical History:  Procedure Laterality Date  . ABDOMINAL HYSTERECTOMY  due to cancer of ovaries  . BACK SURGERY N/A 2014   x 5  . CARDIAC CATHETERIZATION     no PCI; 12/18/12 ([facility], Dr. Marcello Moores): EP study with ablation. No coronary angiography done then.  . COLONOSCOPY W/ POLYPECTOMY    . COLONOSCOPY WITH PROPOFOL N/A  02/04/2016   Procedure: COLONOSCOPY WITH PROPOFOL;  Surgeon: Robert Bellow, MD;  Location: [facility] ENDOSCOPY;  Service: Endoscopy;  Laterality: N/A;  . ESOPHAGOGASTRODUODENOSCOPY (EGD) WITH PROPOFOL N/A 02/04/2016   Procedure: ESOPHAGOGASTRODUODENOSCOPY (EGD) WITH PROPOFOL;  Surgeon: Robert Bellow, MD;  Location: [facility] ENDOSCOPY;  Service: Endoscopy;  Laterality: N/A;  . FINGER ARTHROSCOPY WITH CARPOMETACARPEL ([place]) ARTHROPLASTY Left 05/01/2016   Dr. Erlinda Hong  . GIVENS CAPSULE STUDY N/A 05/02/2016   Procedure: GIVENS CAPSULE STUDY;  Surgeon: Manya Silvas, MD;  [facility] ENDOSCOPY;  Service: Endoscopy;  Laterality: N/A;  . HAND SURGERY    . HEMORRHOID SURGERY    . HIP ARTHROPLASTY    . JOINT REPLACEMENT Bilateral    knee  . KNEE ARTHROPLASTY    . REPLACEMENT TOTAL KNEE Left   . SPINE SURGERY     x 2  . tendonititis     right elbow, right wrist  . TOTAL HIP ARTHROPLASTY Left 2013  . TOTAL HIP ARTHROPLASTY Right 08/16/2015   Procedure: TOTAL HIP ARTHROPLASTY ANTERIOR APPROACH;  Surgeon: Meredith Pel, MD;  Location: [place];  Service: Orthopedics;  Laterality: Right;  . TOTAL KNEE ARTHROPLASTY Right 03/16/2015   Procedure: RIGHT TOTAL KNEE ARTHROPLASTY, PCL SACRIFICING;  Surgeon: Meredith Pel, MD;  Location: [place];  Service: Orthopedics;  Laterality: Right;    Family History  Problem Relation Age of Onset  . Diabetes Mother   . Hypertension Mother   . Heart disease Mother   . Diabetes Father   . Cancer Father   . Hypertension Sister   . Depression Sister   . Cancer Brother   . Heart disease Brother   . Bladder Cancer Neg Hx   . Kidney disease Neg Hx   . Kidney cancer Neg Hx     Social History   Socioeconomic History  . Marital status: Married    Spouse name: Not on file  . Number of children: Not on file  . Years of education: Not on file  . Highest education level: Not on file  Social Needs  . Financial resource strain: Not on file  . Food insecurity -  worry: Not on file  . Food insecurity - inability: Not on file  . Transportation needs - medical: Not on file  . Transportation needs - non-medical: Not on file  Occupational History  . Not on file  Tobacco Use  . Smoking status: Never Smoker  . Smokeless tobacco: Never Used  Substance and Sexual Activity  . Alcohol use: No    Alcohol/week: 0.0 oz  . Drug use: No  . Sexual activity: No  Other Topics Concern  . Not on file  Social History Narrative  . Not on file     Current Outpatient Medications:  .  aspirin 81 MG chewable tablet, Chew 81 mg by mouth daily., Disp: , Rfl:  .  atorvastatin (LIPITOR) 40 MG tablet, TAKE 1 TABLET AT BEDTIME, Disp: 90 tablet, Rfl: 1 .  baclofen (LIORESAL) 10 MG tablet, TAKE ONE TABLET AT 7AM AND ONE TABLET AT 2PM AS NEEDED FOR MUSCLE SPASM, Disp: 180 tablet, Rfl: 1 .  Cholecalciferol (D3-1000) 1000 units tablet, Take 1,000 Units by  mouth at bedtime. , Disp: , Rfl:  .  cycloSPORINE (RESTASIS) 0.05 % ophthalmic emulsion, Place 1 drop into both eyes 2 (two) times daily. , Disp: , Rfl:  .  diclofenac sodium (VOLTAREN) 1 % GEL, Apply 3 g topically 3 (three) times daily as needed., Disp: , Rfl:  .  diclofenac sodium (VOLTAREN) 1 % GEL, Apply 4 g topically 3 (three) times daily as needed. Voltaren Gel 4 grams to 3 large joints upto TID prn 10 TUBES with 1 refills, Disp: 10 Tube, Rfl: 1 .  divalproex (DEPAKOTE) 250 MG DR tablet, Take 1 tablet (250 mg total) 2 (two) times daily by mouth., Disp: 180 tablet, Rfl: 1 .  estradiol (ESTRACE VAGINAL) 0.1 MG/GM vaginal cream, Apply 0.5mg  (pea-sized amount)  just inside the vaginal introitus with a finger-tip every night for two weeks and then Monday, Wednesday and Friday nights., Disp: 30 g, Rfl: 12 .  gabapentin (NEURONTIN) 300 MG capsule, Take 3 capsules (900 mg total) by mouth 3 (three) times daily., Disp: 810 capsule, Rfl: 0 .  ibuprofen (ADVIL,MOTRIN) 600 MG tablet, 600 mg 2 (two) times daily. , Disp: , Rfl:  .   lansoprazole (PREVACID) 30 MG capsule, Take 1 capsule (30 mg total) daily by mouth., Disp: 90 capsule, Rfl: 1 .  levalbuterol (XOPENEX) 1.25 MG/3ML nebulizer solution, Take 1.25 mg by nebulization every 4 (four) hours as needed for wheezing., Disp: 72 mL, Rfl: 4 .  levothyroxine (SYNTHROID) 88 MCG tablet, Take 88 mcg by mouth daily before breakfast. , Disp: , Rfl:  .  magnesium oxide (MAG-OX) 400 MG tablet, Take 400 mg by mouth at bedtime. Reported on 07/28/2015, Disp: , Rfl:  .  metoprolol tartrate (LOPRESSOR) 25 MG tablet, TAKE 1 TABLET BY MOUTH TWICE A DAY, Disp: 180 tablet, Rfl: 1 .  mometasone-formoterol (DULERA) 200-5 MCG/ACT AERO, Inhale 2 puffs into the lungs 2 (two) times daily. Reported on 07/28/2015, Disp: 3 Inhaler, Rfl: 0 .  montelukast (SINGULAIR) 10 MG tablet, Take 1 tablet (10 mg total) by mouth at bedtime., Disp: 90 tablet, Rfl: 1 .  olmesartan-hydrochlorothiazide (BENICAR HCT) 40-12.5 MG tablet, Take 1 tablet by mouth daily., Disp: 90 tablet, Rfl: 0 .  omega-3 acid ethyl esters (LOVAZA) 1 g capsule, Take 2 capsules (2 g total) by mouth daily., Disp: 360 capsule, Rfl: 1 .  ondansetron (ZOFRAN) 4 MG tablet, Take 1 tablet (4 mg total) by mouth every 8 (eight) hours as needed for nausea or vomiting., Disp: 20 tablet, Rfl: 0 .  Polyethyl Glycol-Propyl Glycol 0.4-0.3 % SOLN, Apply to eye., Disp: , Rfl:  .  polyethylene glycol powder (GLYCOLAX/MIRALAX) powder, 255 grams one bottle for capsule endoscopy prep, Disp: 255 g, Rfl: 0 .  Tetrahydrozoline HCl (EYE DROPS OP), Apply to eye., Disp: , Rfl:  .  venlafaxine XR (EFFEXOR-XR) 150 MG 24 hr capsule, Take 1 capsule (150 mg total) by mouth daily with breakfast., Disp: 90 capsule, Rfl: 1 .  vitamin B-12 (CYANOCOBALAMIN) 1000 MCG tablet, Take 1,000 mcg by mouth at bedtime., Disp: , Rfl:  .  metFORMIN (GLUCOPHAGE-XR) 750 MG 24 hr tablet, Take 1 tablet (750 mg total) by mouth daily with breakfast., Disp: 90 tablet, Rfl: 1  Allergies  Allergen  Reactions  . Morphine Nausea And Vomiting    Ok with nausea medication  . Diazepam Nausea And Vomiting  . Lyrica [Pregabalin] Other (See Comments)    Joint swelling  . Latex Rash  . Rash Away  [Petrolatum-Zinc Oxide] Rash and Swelling  .  Salicylates Other (See Comments)    Other reaction(s): Headache  . Tape Rash    Please use paper tape     ROS  Constitutional: Negative for fever , positive for  weight change.  Respiratory: Negative for cough and shortness of breath.   Cardiovascular:Positive for intermittent chest pain or palpitations.  Gastrointestinal: Negative for abdominal pain, no bowel changes.  Musculoskeletal: Negative for gait problem or joint swelling.  Skin: Negative for rash.  Neurological: Negative for dizziness , positive for intermittent headache.  No other specific complaints in a complete review of systems (except as listed in HPI above).  Objective  Vitals:   04/13/17 1327  BP: 140/60  Pulse: 79  Resp: 14  SpO2: 96%  Weight: 230 lb (104.3 kg)  Height: 5\' 4"  (1.626 m)    Body mass index is 39.48 kg/m.  Physical Exam  Constitutional: Patient appears well-developed and well-nourished. Obese No distress.  HEENT: head atraumatic, normocephalic, pupils equal and reactive to light,  neck supple, throat within normal limits Cardiovascular: Normal rate, regular rhythm and normal heart sounds.  No murmur heard. No BLE edema. Pulmonary/Chest: Effort normal and breath sounds normal. No respiratory distress. Abdominal: Soft.  There is no tenderness. Psychiatric: Patient has a normal mood and affect. behavior is normal. Judgment and thought content normal. Muscular Skeletal: trigger point positive   PHQ2/9: Depression screen [facility] 2/9 01/11/2017 07/12/2016 04/14/2016 01/11/2016 12/10/2015  Decreased Interest 0 0 0 0 0  Down, Depressed, Hopeless 0 0 0 0 0  PHQ - 2 Score 0 0 0 0 0  Altered sleeping 2 - - - -  Tired, decreased energy 1 - - - -  Change in  appetite 0 - - - -  Feeling bad or failure about yourself  0 - - - -  Trouble concentrating 0 - - - -  Moving slowly or fidgety/restless 0 - - - -  Suicidal thoughts 0 - - - -  PHQ-9 Score 3 - - - -  Difficult doing work/chores Somewhat difficult - - - -     Fall Risk: Fall Risk  04/13/2017 01/11/2017 07/12/2016 04/14/2016 01/11/2016  Falls in the past year? No No Yes Yes Yes  Number falls in past yr: - - 2 or more 1 1  Injury with Fall? - - No Yes Yes  Risk Factor Category  - - - - High Fall Risk  Risk for fall due to : - - - - -  Follow up - - - - Falls evaluation completed     Functional Status Survey: Is the patient deaf or have difficulty hearing?: No Does the patient have difficulty seeing, even when wearing glasses/contacts?: No Does the patient have difficulty concentrating, remembering, or making decisions?: No Does the patient have difficulty walking or climbing stairs?: No Does the patient have difficulty dressing or bathing?: No Does the patient have difficulty doing errands alone such as visiting a doctor's office or shopping?: No    Assessment & Plan  1. Angina pectoris (HCC)  Intermittent symptoms, on statin therapy and aspirin   2. Bipolar 1 disorder, mixed ([place])  Continue medication, mood has been stable  3. Essential hypertension  - olmesartan-hydrochlorothiazide (BENICAR HCT) 40-12.5 MG tablet; Take 1 tablet by mouth daily.  Dispense: 90 tablet; Refill: 0  4. Pre-diabetes  - metFORMIN (GLUCOPHAGE-XR) 750 MG 24 hr tablet; Take 1 tablet (750 mg total) by mouth daily with breakfast.  Dispense: 90 tablet; Refill: 1  5. Moderate episode  of recurrent major depressive disorder (HCC)  - venlafaxine XR (EFFEXOR-XR) 150 MG 24 hr capsule; Take 1 capsule (150 mg total) by mouth daily with breakfast.  Dispense: 90 capsule; Refill: 1  6. Simple chronic bronchitis ([place])  On Dulera  7. Fibromyalgia  She states worse with changing in temperature  8.  Migraine with aura and without status migrainosus, not intractable  Seeing Dr. Manuella Ghazi  9. Dyslipidemia  - omega-3 acid ethyl esters (LOVAZA) 1 g capsule; Take 2 capsules (2 g total) by mouth daily.  Dispense: 360 capsule; Refill: 1 Very high triglycerides   10. GAD (generalized anxiety disorder)   venlafaxine XR (EFFEXOR-XR) 150 MG 24 hr capsule; Take 1 capsule (150 mg total) by mouth daily with breakfast.  Dispense: 90 capsule; Refill: 1

## 2017-04-23 ENCOUNTER — Telehealth: Payer: Self-pay

## 2017-04-23 ENCOUNTER — Other Ambulatory Visit: Payer: Self-pay | Admitting: Family Medicine

## 2017-04-23 DIAGNOSIS — E039 Hypothyroidism, unspecified: Secondary | ICD-10-CM

## 2017-04-23 NOTE — Telephone Encounter (Signed)
I have not prescribed her synthroid, last rx given by Dr. Rutherford Nail. I would like to order TSH prior to referral if possible.

## 2017-04-23 NOTE — Telephone Encounter (Signed)
Copied from [place] [id number]. Topic: Referral - Request >> Apr 23, 2017  2:35 PM Hewitt Shorts wrote: Reason for CRM: pt is requesting a referral for her thyroid   Best number [phone]

## 2017-04-24 NOTE — Telephone Encounter (Signed)
No voicemail is set up, wanted to ask why patient needed a Endocrinology referral. Also who has been filling her prescription of Synthroid? If possible Dr. Ancil Boozer would like to have a blood test of her TSH before a referral should be ordered.

## 2017-04-30 ENCOUNTER — Encounter (INDEPENDENT_AMBULATORY_CARE_PROVIDER_SITE_OTHER): Payer: Self-pay | Admitting: Vascular Surgery

## 2017-04-30 ENCOUNTER — Ambulatory Visit (INDEPENDENT_AMBULATORY_CARE_PROVIDER_SITE_OTHER): Payer: Medicare Other | Admitting: Vascular Surgery

## 2017-04-30 VITALS — BP 145/66 | HR 69 | Resp 18 | Wt 222.0 lb

## 2017-04-30 DIAGNOSIS — I739 Peripheral vascular disease, unspecified: Secondary | ICD-10-CM | POA: Diagnosis not present

## 2017-04-30 DIAGNOSIS — I209 Angina pectoris, unspecified: Secondary | ICD-10-CM | POA: Diagnosis not present

## 2017-04-30 DIAGNOSIS — E785 Hyperlipidemia, unspecified: Secondary | ICD-10-CM | POA: Diagnosis not present

## 2017-04-30 DIAGNOSIS — I83813 Varicose veins of bilateral lower extremities with pain: Secondary | ICD-10-CM | POA: Insufficient documentation

## 2017-04-30 NOTE — Progress Notes (Signed)
Subjective:    Patient ID: Jacqueline Lucas, female    DOB: 09-20-1950, 67 y.o.   MRN: [id number] Chief Complaint  Patient presents with  . Follow-up    varicose veins   Patient last seen in 2016 for evaluation of peripheral artery disease and painful varicose veins.  The patient presents today with chief complaint of worsening bilateral pain along the varicosities in her legs.  The patient notes her discomfort has progressively worsened over the last 6 months.  The patient notes her discomfort is worse towards the end of the day or with sitting and standing for long periods of time.  The patient feels her symptoms have progressed to the point that she is unable to function on a daily basis this is what prompted her to seek medical attention.  The patient feels her symptoms have become lifestyle limiting.  Since her initial visit many years ago, the patient has been wearing medical grade 1 compression stockings and elevating her legs on a daily basis.  This is offered her minimal improvement in her symptoms.  The patient also notes experiencing right calf cramping when walking long distances.  The patient denies any rest pain or ulceration to the bilateral lower extremity.  The patient denies any fever, nausea vomiting.   Review of Systems  Constitutional: Negative.   HENT: Negative.   Eyes: Negative.   Respiratory: Negative.   Cardiovascular:       Right calf cramping Painful varicose veins  Gastrointestinal: Negative.   Endocrine: Negative.   Genitourinary: Negative.   Musculoskeletal: Negative.   Skin: Negative.   Allergic/Immunologic: Negative.   Neurological: Negative.   Hematological: Negative.   Psychiatric/Behavioral: Negative.       Objective:   Physical Exam  Constitutional: She is oriented to person, place, and time. She appears well-developed and well-nourished. No distress.  HENT:  Head: Normocephalic and atraumatic.  Eyes: Conjunctivae are normal. Pupils are  equal, round, and reactive to light.  Neck: Normal range of motion.  Cardiovascular: Normal rate, regular rhythm, normal heart sounds and intact distal pulses.  Pulses:      Radial pulses are 2+ on the right side, and 2+ on the left side.       Dorsalis pedis pulses are 1+ on the right side, and 1+ on the left side.       Posterior tibial pulses are 1+ on the right side, and 1+ on the left side.  Pulmonary/Chest: Effort normal and breath sounds normal.  Musculoskeletal: Normal range of motion. She exhibits no edema.  Neurological: She is alert and oriented to person, place, and time.  Skin: Skin is warm and dry. She is not diaphoretic.  Greater than 1 cm scattered varicosities noted to the bilateral lower extremity.  There is no stasis dermatitis, skin changes or cellulitis noted to the bilateral lower extremity  Psychiatric: She has a normal mood and affect. Her behavior is normal. Judgment and thought content normal.  Vitals reviewed.  BP (!) 145/66 (BP Location: Right Arm)   Pulse 69   Resp 18   Wt 222 lb (100.7 kg)   BMI 38.11 kg/m   Past Medical History:  Diagnosis Date  . Allergy   . Allergy to adhesive tape    Allergy to paper tape as well  . Alzheimer disease   . Anemia   . Anginal pain ([place])   . Anxiety   . Arthritis   . Asthma   . Bronchitis   .  Cataract   . Chronic nausea   . Claustrophobia   . DDD (degenerative disc disease), lumbar   . Depression   . Dry eyes    uses Restasis  . Fibromyalgia   . GERD (gastroesophageal reflux disease)   . H/O bladder infections   . Heart murmur   . Hematuria   . Hyperlipidemia   . Hypertension   . Hypothyroidism   . IBS (irritable bowel syndrome)   . Insomnia   . Lumbar neuritis   . Migraines   . Obesity   . Osteoporosis   . Palpitation   . PONV (postoperative nausea and vomiting)   . RLS (restless legs syndrome)   . Shoulder fracture, right   . Sicca ([place])   . Tachycardia   . Thyroid disease    Social  History   Socioeconomic History  . Marital status: Married    Spouse name: Not on file  . Number of children: Not on file  . Years of education: Not on file  . Highest education level: Not on file  Social Needs  . Financial resource strain: Not on file  . Food insecurity - worry: Not on file  . Food insecurity - inability: Not on file  . Transportation needs - medical: Not on file  . Transportation needs - non-medical: Not on file  Occupational History  . Not on file  Tobacco Use  . Smoking status: Never Smoker  . Smokeless tobacco: Never Used  Substance and Sexual Activity  . Alcohol use: No    Alcohol/week: 0.0 oz  . Drug use: No  . Sexual activity: No  Other Topics Concern  . Not on file  Social History Narrative  . Not on file   Past Surgical History:  Procedure Laterality Date  . ABDOMINAL HYSTERECTOMY     due to cancer of ovaries  . BACK SURGERY N/A 2014   x 5  . CARDIAC CATHETERIZATION     no PCI; 12/18/12 ([facility], Dr. Marcello Moores): EP study with ablation. No coronary angiography done then.  . COLONOSCOPY W/ POLYPECTOMY    . COLONOSCOPY WITH PROPOFOL N/A 02/04/2016   Procedure: COLONOSCOPY WITH PROPOFOL;  Surgeon: Robert Bellow, MD;  Location: [facility] ENDOSCOPY;  Service: Endoscopy;  Laterality: N/A;  . ESOPHAGOGASTRODUODENOSCOPY (EGD) WITH PROPOFOL N/A 02/04/2016   Procedure: ESOPHAGOGASTRODUODENOSCOPY (EGD) WITH PROPOFOL;  Surgeon: Robert Bellow, MD;  Location: [facility] ENDOSCOPY;  Service: Endoscopy;  Laterality: N/A;  . FINGER ARTHROSCOPY WITH CARPOMETACARPEL ([place]) ARTHROPLASTY Left 05/01/2016   Dr. Erlinda Hong  . GIVENS CAPSULE STUDY N/A 05/02/2016   Procedure: GIVENS CAPSULE STUDY;  Surgeon: Manya Silvas, MD;  Location: [facility] ENDOSCOPY;  Service: Endoscopy;  Laterality: N/A;  . HAND SURGERY    . HEMORRHOID SURGERY    . HIP ARTHROPLASTY    . JOINT REPLACEMENT Bilateral    knee  . KNEE ARTHROPLASTY    . REPLACEMENT TOTAL KNEE Left   . SPINE SURGERY     x 2  .  tendonititis     right elbow, right wrist  . TOTAL HIP ARTHROPLASTY Left 2013  . TOTAL HIP ARTHROPLASTY Right 08/16/2015   Procedure: TOTAL HIP ARTHROPLASTY ANTERIOR APPROACH;  Surgeon: Meredith Pel, MD;  Location: [place];  Service: Orthopedics;  Laterality: Right;  . TOTAL KNEE ARTHROPLASTY Right 03/16/2015   Procedure: RIGHT TOTAL KNEE ARTHROPLASTY, PCL SACRIFICING;  Surgeon: Meredith Pel, MD;  Location: [place];  Service: Orthopedics;  Laterality: Right;   Family History  Problem  Relation Age of Onset  . Diabetes Mother   . Hypertension Mother   . Heart disease Mother   . Diabetes Father   . Cancer Father   . Hypertension Sister   . Depression Sister   . Cancer Brother   . Heart disease Brother   . Bladder Cancer Neg Hx   . Kidney disease Neg Hx   . Kidney cancer Neg Hx    Allergies  Allergen Reactions  . Morphine Nausea And Vomiting    Ok with nausea medication  . Diazepam Nausea And Vomiting  . Lyrica [Pregabalin] Other (See Comments)    Joint swelling  . Latex Rash  . Rash Away  [Petrolatum-Zinc Oxide] Rash and Swelling  . Salicylates Other (See Comments)    Other reaction(s): Headache  . Tape Rash    Please use paper tape      Assessment & Plan:  Patient last seen in 2016 for evaluation of peripheral artery disease and painful varicose veins.  The patient presents today with chief complaint of worsening bilateral pain along the varicosities in her legs.  The patient notes her discomfort has progressively worsened over the last 6 months.  The patient notes her discomfort is worse towards the end of the day or with sitting and standing for long periods of time.  The patient feels her symptoms have progressed to the point that she is unable to function on a daily basis this is what prompted her to seek medical attention.  The patient feels her symptoms have become lifestyle limiting.  Since her initial visit many years ago, the patient has been wearing medical grade  1 compression stockings and elevating her legs on a daily basis.  This is offered her minimal improvement in her symptoms.  The patient also notes experiencing right calf cramping when walking long distances.  The patient denies any rest pain or ulceration to the bilateral lower extremity.  The patient denies any fever, nausea vomiting.  1. Varicose veins of both lower extremities with pain - Stable Patient with greater than 1 cm varicosities noted on physical exam to the bilateral lower extremity The patient has been engaging in conservative therapy including wearing medical grade 1 compression stockings and elevating her legs with minimal improvement to the pain located along these varicosities I will bring the patient back to undergo bilateral venous reflux to rule out any new/worsening reflux disease  2. Claudication ([place]) - Stable Patient with multiple risk factors for peripheral artery disease Patient does have a past medical history of peripheral artery disease Patient with worsening claudication-like symptoms to the right calf I will bring the patient back to undergo an ABI to rule out any contributing peripheral artery disease  3. Dyslipidemia - Stable Encouraged good control as its slows the progression of atherosclerotic disease  Current Outpatient Medications on File Prior to Visit  Medication Sig Dispense Refill  . aspirin 81 MG chewable tablet Chew 81 mg by mouth daily.    [name] atorvastatin (LIPITOR) 40 MG tablet TAKE 1 TABLET AT BEDTIME 90 tablet 1  . baclofen (LIORESAL) 10 MG tablet TAKE ONE TABLET AT 7AM AND ONE TABLET AT 2PM AS NEEDED FOR MUSCLE SPASM 180 tablet 1  . Cholecalciferol (D3-1000) 1000 units tablet Take 1,000 Units by mouth at bedtime.     . cycloSPORINE (RESTASIS) 0.05 % ophthalmic emulsion Place 1 drop into both eyes 2 (two) times daily.     . diclofenac sodium (VOLTAREN) 1 % GEL Apply 4 g  topically 3 (three) times daily as needed. Voltaren Gel 4 grams to 3 large  joints upto TID prn 10 TUBES with 1 refills 10 Tube 1  . divalproex (DEPAKOTE) 250 MG DR tablet Take 1 tablet (250 mg total) 2 (two) times daily by mouth. 180 tablet 1  . estradiol (ESTRACE VAGINAL) 0.1 MG/GM vaginal cream Apply 0.5mg  (pea-sized amount)  just inside the vaginal introitus with a finger-tip every night for two weeks and then Monday, Wednesday and Friday nights. 30 g 12  . gabapentin (NEURONTIN) 300 MG capsule Take 3 capsules (900 mg total) by mouth 3 (three) times daily. 810 capsule 0  . ibuprofen (ADVIL,MOTRIN) 600 MG tablet 600 mg 2 (two) times daily.     . lansoprazole (PREVACID) 30 MG capsule Take 1 capsule (30 mg total) daily by mouth. 90 capsule 1  . levalbuterol (XOPENEX) 1.25 MG/3ML nebulizer solution Take 1.25 mg by nebulization every 4 (four) hours as needed for wheezing. 72 mL 4  . levothyroxine (SYNTHROID) 88 MCG tablet Take 88 mcg by mouth daily before breakfast.     . magnesium oxide (MAG-OX) 400 MG tablet Take 400 mg by mouth at bedtime. Reported on 07/28/2015    . metFORMIN (GLUCOPHAGE-XR) 750 MG 24 hr tablet Take 1 tablet (750 mg total) by mouth daily with breakfast. 90 tablet 1  . metoprolol tartrate (LOPRESSOR) 25 MG tablet TAKE 1 TABLET BY MOUTH TWICE A DAY 180 tablet 1  . mometasone-formoterol (DULERA) 200-5 MCG/ACT AERO Inhale 2 puffs into the lungs 2 (two) times daily. Reported on 07/28/2015 3 Inhaler 0  . montelukast (SINGULAIR) 10 MG tablet Take 1 tablet (10 mg total) by mouth at bedtime. 90 tablet 1  . olmesartan-hydrochlorothiazide (BENICAR HCT) 40-12.5 MG tablet Take 1 tablet by mouth daily. 90 tablet 0  . omega-3 acid ethyl esters (LOVAZA) 1 g capsule Take 2 capsules (2 g total) by mouth daily. 360 capsule 1  . ondansetron (ZOFRAN) 4 MG tablet Take 1 tablet (4 mg total) by mouth every 8 (eight) hours as needed for nausea or vomiting. 20 tablet 0  . Polyethyl Glycol-Propyl Glycol 0.4-0.3 % SOLN Apply to eye.    . polyethylene glycol powder (GLYCOLAX/MIRALAX)  powder 255 grams one bottle for capsule endoscopy prep 255 g 0  . Tetrahydrozoline HCl (EYE DROPS OP) Apply to eye.    . venlafaxine XR (EFFEXOR-XR) 150 MG 24 hr capsule Take 1 capsule (150 mg total) by mouth daily with breakfast. 90 capsule 1  . vitamin B-12 (CYANOCOBALAMIN) 1000 MCG tablet Take 1,000 mcg by mouth at bedtime.     No current facility-administered medications on file prior to visit.    There are no Patient Instructions on file for this visit. No Follow-up on file.  Macy Polio A Lacy Sofia, PA-C

## 2017-05-01 ENCOUNTER — Telehealth: Payer: Self-pay

## 2017-05-01 ENCOUNTER — Other Ambulatory Visit: Payer: Self-pay | Admitting: Family Medicine

## 2017-05-01 DIAGNOSIS — E039 Hypothyroidism, unspecified: Secondary | ICD-10-CM

## 2017-05-01 NOTE — Telephone Encounter (Signed)
Copied from [place] [phone]. Topic: Referral - Request >> May 01, 2017  3:56 PM Carolyn Stare wrote:  Pt would like a referral to [facility] because she would like to change doctors from her current Thyroid  doctor. They have contacted the [facility] office and was told they need a referral from Dr Ancil Boozer  Pt would like a call back [phone]

## 2017-05-02 ENCOUNTER — Other Ambulatory Visit: Payer: Self-pay | Admitting: Family Medicine

## 2017-05-02 DIAGNOSIS — R7303 Prediabetes: Secondary | ICD-10-CM

## 2017-05-02 MED ORDER — METFORMIN HCL ER 500 MG PO TB24: 500.0000 mg | ORAL_TABLET | Freq: Every day | ORAL | 0 refills | Status: DC

## 2017-05-02 NOTE — Telephone Encounter (Signed)
Copied from [place]. Topic: Quick Communication - Rx Refill/Question >> May 02, 2017  2:57 PM Scherrie Gerlach wrote: Medication: metFORMIN (GLUCOPHAGE-XR) 750 MG 24 hr tablet Husband called to advise this ned keep pt going to the restroom 4-5 times a day with diarrhea. Pt hoping to get lower dose.  Pharmacist advised this med could not be cut in half.  Pt would like to try a 30 day at local pharmacy of a new med, perhaps half this dose  [id number] [name], [place] [phone] (Fax)

## 2017-05-02 NOTE — Progress Notes (Signed)
Office Visit Note  Patient: Jacqueline Lucas             Date of Birth: 06/18/1950           MRN: [id number]             PCP: [name], MD Referring: [name], MD Visit Date: 05/15/2017 Occupation: @GUAROCC @    Subjective:  Other (BIL hand pain/swelling, BIL hip pain (wants injections) )   History of Present Illness: Jacqueline Lucas is a 67 y.o. female history of fibromyalgia osteoarthritis and osteopenia.  She states she has been having increased pain and discomfort recently.  She describes pain and discomfort in her bilateral hands.  She has been using topical agents without much relief.  She is also having increased pain and discomfort in her bilateral trochanteric area.  Bilateral total hip and  knee replacement is doing well.  Activities of Daily Living:  Patient reports morning stiffness for 30 minutes.   Patient Reports nocturnal pain.  Difficulty dressing/grooming: Reports Difficulty climbing stairs: Reports Difficulty getting out of chair: Reports Difficulty using hands for taps, buttons, cutlery, and/or writing: Reports   Review of Systems  Constitutional: Positive for fatigue. Negative for night sweats, weight gain, weight loss and weakness.  HENT: Positive for mouth dryness. Negative for mouth sores, trouble swallowing, trouble swallowing and nose dryness.   Eyes: Positive for dryness. Negative for pain, redness and visual disturbance.  Respiratory: Negative for cough, shortness of breath and difficulty breathing.        H/o asthma  Cardiovascular: Negative for chest pain, palpitations, hypertension, irregular heartbeat and swelling in legs/feet.  Gastrointestinal: Negative for blood in stool, constipation and diarrhea.  Endocrine: Negative for increased urination.  Genitourinary: Negative for vaginal dryness.  Musculoskeletal: Positive for arthralgias, joint pain, myalgias, morning stiffness and myalgias. Negative for joint swelling, muscle  weakness and muscle tenderness.  Skin: Negative for color change, rash, hair loss, skin tightness, ulcers and sensitivity to sunlight.  Allergic/Immunologic: Negative for susceptible to infections.  Neurological: Negative for dizziness, memory loss and night sweats.  Hematological: Negative for swollen glands.  Psychiatric/Behavioral: Positive for depressed mood and sleep disturbance. The patient is nervous/anxious.     PMFS History:  Patient Active Problem List   Diagnosis Date Noted  . Varicose veins of both lower extremities with pain 04/30/2017  . Claudication ([place]) 04/30/2017  . Arthritis of carpometacarpal [facility]) joint of left thumb 05/12/2016  . Bilateral thumb pain 05/01/2016  . Chronic bronchitis ([place]) 04/14/2016  . Anemia 02/03/2016  . Status post bilateral total hip replacement 01/10/2016  . Sicca ([place]) 01/10/2016  . Age related osteoporosis 01/10/2016  . Hip arthritis 08/16/2015  . Microscopic hematuria 07/30/2015  . Vaginal atrophy 07/30/2015  . GAD (generalized anxiety disorder) 07/08/2015  . Moderate episode of recurrent major depressive disorder ([place]) 07/08/2015  . Migraine with aura and without status migrainosus, not intractable 07/08/2015  . Dyslipidemia 07/08/2015  . Degenerative arthritis of right knee 03/16/2015  . Atrophic vaginitis 01/31/2015  . Recurrent UTI 12/31/2014  . IBS (irritable bowel syndrome) 10/29/2014  . Asthma, moderate persistent 09/08/2014  . Allergic state 09/07/2014  . Colon polyp 09/07/2014  . Hemorrhoid 09/07/2014  . Constrictive tenosynovitis 03/12/2014  . H/O total knee replacement, bilateral 07/31/2013  . Angina pectoris ([place]) 11/28/2012  . Anxiety 11/28/2012  . Acid reflux 11/28/2012  . Cardiac murmur 11/28/2012  . BP (high blood pressure) 11/28/2012  . Cannot sleep 11/28/2012  . Adiposity 11/28/2012  .  Arthritis, degenerative 11/28/2012  . Restless leg 11/28/2012  . Supraventricular tachycardia ([place]) 11/28/2012  .  Fibromyalgia 11/28/2012    Past Medical History:  Diagnosis Date  . Allergy   . Allergy to adhesive tape    Allergy to paper tape as well  . Alzheimer disease   . Anemia   . Anginal pain ([place])   . Anxiety   . Arthritis   . Asthma   . Bronchitis   . Cataract   . Chronic nausea   . Claustrophobia   . DDD (degenerative disc disease), lumbar   . Depression   . Dry eyes    uses Restasis  . Fibromyalgia   . GERD (gastroesophageal reflux disease)   . H/O bladder infections   . Heart murmur   . Hematuria   . Hyperlipidemia   . Hypertension   . Hypothyroidism   . IBS (irritable bowel syndrome)   . Insomnia   . Lumbar neuritis   . Migraines   . Obesity   . Osteoporosis   . Palpitation   . PONV (postoperative nausea and vomiting)   . RLS (restless legs syndrome)   . Shoulder fracture, right   . Sicca ([place])   . Tachycardia   . Thyroid disease     Family History  Problem Relation Age of Onset  . Diabetes Mother   . Hypertension Mother   . Heart disease Mother   . Diabetes Father   . Cancer Father   . Hypertension Sister   . Depression Sister   . Cancer Brother   . Heart disease Brother   . Stroke Son   . Fibromyalgia Daughter   . Bladder Cancer Neg Hx   . Kidney disease Neg Hx   . Kidney cancer Neg Hx    Past Surgical History:  Procedure Laterality Date  . ABDOMINAL HYSTERECTOMY     due to cancer of ovaries  . BACK SURGERY N/A 2014   x 5  . CARDIAC CATHETERIZATION     no PCI; 12/18/12 ([facility], Dr. Marcello Moores): EP study with ablation. No coronary angiography done then.  . COLONOSCOPY W/ POLYPECTOMY    . COLONOSCOPY WITH PROPOFOL N/A 02/04/2016   Procedure: COLONOSCOPY WITH PROPOFOL;  Surgeon: Robert Bellow, MD;  Location: [facility] ENDOSCOPY;  Service: Endoscopy;  Laterality: N/A;  . ESOPHAGOGASTRODUODENOSCOPY (EGD) WITH PROPOFOL N/A 02/04/2016   Procedure: ESOPHAGOGASTRODUODENOSCOPY (EGD) WITH PROPOFOL;  Surgeon: Robert Bellow, MD;  Location: [facility] ENDOSCOPY;   Service: Endoscopy;  Laterality: N/A;  . FINGER ARTHROSCOPY WITH CARPOMETACARPEL ([place]) ARTHROPLASTY Left 05/01/2016   Dr. Erlinda Hong  . GIVENS CAPSULE STUDY N/A 05/02/2016   Procedure: GIVENS CAPSULE STUDY;  Surgeon: Manya Silvas, MD;  Location: [facility] ENDOSCOPY;  Service: Endoscopy;  Laterality: N/A;  . HAND SURGERY    . HEMORRHOID SURGERY    . HIP ARTHROPLASTY    . JOINT REPLACEMENT Bilateral    knee  . KNEE ARTHROPLASTY    . REPLACEMENT TOTAL KNEE Left   . SPINE SURGERY     x 2  . tendonititis     right elbow, right wrist  . TOTAL HIP ARTHROPLASTY Left 2013  . TOTAL HIP ARTHROPLASTY Right 08/16/2015   Procedure: TOTAL HIP ARTHROPLASTY ANTERIOR APPROACH;  Surgeon: Meredith Pel, MD;  Location: [place];  Service: Orthopedics;  Laterality: Right;  . TOTAL KNEE ARTHROPLASTY Right 03/16/2015   Procedure: RIGHT TOTAL KNEE ARTHROPLASTY, PCL SACRIFICING;  Surgeon: Meredith Pel, MD;  Location: [place];  Service: Orthopedics;  Laterality: Right;   Social History   Social History Narrative  . Not on file     Objective: Vital Signs: BP 122/65 (BP Location: Right Arm, Patient Position: Sitting, Cuff Size: Normal)   Pulse 61   Resp 17   Ht 5\' 4"  (1.626 m)   Wt 225 lb (102.1 kg)   BMI 38.62 kg/m    Physical Exam   Musculoskeletal Exam: C-spine thoracic lumbar spine discomfort with range of motion.  She has discomfort range of motion of her left shoulder.  Elbow joints wrist joint MCPs PIPs DIPs were in good range of motion.  She has some DIP PIP thickening in her hands consistent with osteoarthritis.  I did not appreciate any synovitis.  She has bilateral total hip replacement and bilateral knee replacement which is doing fairly well.  She has tenderness on palpation of bilateral trochanteric bursa consistent with trochanteric bursitis.  CDAI Exam: No CDAI exam completed.    Investigation: No additional findings. CBC Latest Ref Rng & Units 12/04/2016 06/08/2016 12/10/2015  WBC 3.8 -  10.8 Thousand/uL 8.0 6.2 6.7  Hemoglobin 11.7 - 15.5 g/dL 13.2 13.6 9.9(L)  Hematocrit 35.0 - 45.0 % 38.6 40.5 32.3(L)  Platelets 140 - 400 Thousand/uL 230 226 357   CMP Latest Ref Rng & Units 12/04/2016 06/23/2016 12/10/2015  Glucose 65 - 99 mg/dL 107(H) - 97  BUN 7 - 25 mg/dL 19 - 16  Creatinine 0.50 - 0.99 mg/dL 0.80 0.80 0.77  Sodium 135 - 146 mmol/L 140 - 140  Potassium 3.5 - 5.3 mmol/L 4.3 - 4.6  Chloride 98 - 110 mmol/L 102 - 105  CO2 20 - 32 mmol/L 29 - 25  Calcium 8.6 - 10.4 mg/dL 9.3 - 9.2  Total Protein 6.1 - 8.1 g/dL 6.5 - 6.6  Total Bilirubin 0.2 - 1.2 mg/dL 0.5 - 0.5  Alkaline Phos 33 - 130 U/L - - 105  AST 10 - 35 U/L 10 - 14  ALT 6 - 29 U/L 8 - 13    Imaging: No results found.  Speciality Comments: No specialty comments available.    Procedures:  Large Joint Inj: bilateral greater trochanter on 05/15/2017 4:14 PM Indications: pain Details: 27 G 1.5 in needle, lateral approach  Arthrogram: No  Medications (Right): 1.5 mL lidocaine 1 %; 40 mg triamcinolone acetonide 40 MG/ML Medications (Left): 1.5 mL lidocaine 1 %; 40 mg triamcinolone acetonide 40 MG/ML Outcome: tolerated well, no immediate complications Procedure, treatment alternatives, risks and benefits explained, specific risks discussed. Consent was given by the patient. Immediately prior to procedure a time out was called to verify the correct patient, procedure, equipment, support staff and site/side marked as required. Patient was prepped and draped in the usual sterile fashion.     Allergies: Morphine; Diazepam; Lyrica [pregabalin]; Latex; Rash away  [petrolatum-zinc oxide]; Salicylates; and Tape   Assessment / Plan:     Visit Diagnoses: Fibromyalgia -she is having increased pain and discomfort from fibromyalgia.  She states she has been having frequent flares.  She is on gabapentin 900 mg 3 times daily.  We discussed referral to physical therapy.  She wants to proceed with that.  Plan: Ambulatory  referral to Physical Therapy  Trochanteric bursitis of both hips: She had tenderness over bilateral trochanteric bursa which is causing a lot of discomfort.  She requested bilateral trochanteric bursa injections.  The procedure was performed as described above.  Pain in thoracic spine -she has been having some thoracic discomfort which appears to  be muscular.  I will refer to physical therapy.  Plan: Ambulatory referral to Physical Therapy  Status post bilateral total hip replacement: Doing well  H/O total knee replacement, bilateral: She has some chronic discomfort.  Primary osteoarthritis of first carpometacarpal joint of left hand: Joint protection muscle strengthening discussed.  Keratoconjunctivitis sicca ([place]): Appears to be related to her medications.  Osteopenia of multiple sites - T score -2.0 in 2015.  She has been taking calcium and vitamin D.  She gets DEXA through her PCP.  Other medical problems are listed as follows:  History of cardiac murmur - and SVT  Recurrent UTI  History of depression  History of anxiety  Restless leg  History of gastroesophageal reflux (GERD)  History of hypertension blood pressure is well controlled.  Have advised her to monitor blood pressure closely after the cortisone injection.  History of anemia    Orders: Orders Placed This Encounter  Procedures  . Large Joint Inj  . Ambulatory referral to Physical Therapy   No orders of the defined types were placed in this encounter.   Face-to-face time spent with patient was 30 minutes.  Greater than 50% of time was spent in counseling and coordination of care.  Follow-Up Instructions: Return in about 6 months (around 11/15/2017) for FMS, OA.   Bo Merino, MD  Note - This record has been created using Editor, commissioning.  Chart creation errors have been sought, but may not always  have been located. Such creation errors do not reflect on  the standard of medical care.

## 2017-05-03 DIAGNOSIS — E039 Hypothyroidism, unspecified: Secondary | ICD-10-CM | POA: Diagnosis not present

## 2017-05-03 DIAGNOSIS — I10 Essential (primary) hypertension: Secondary | ICD-10-CM | POA: Diagnosis not present

## 2017-05-03 DIAGNOSIS — M150 Primary generalized (osteo)arthritis: Secondary | ICD-10-CM | POA: Diagnosis not present

## 2017-05-03 DIAGNOSIS — M810 Age-related osteoporosis without current pathological fracture: Secondary | ICD-10-CM | POA: Diagnosis not present

## 2017-05-03 DIAGNOSIS — F411 Generalized anxiety disorder: Secondary | ICD-10-CM | POA: Diagnosis not present

## 2017-05-03 DIAGNOSIS — E049 Nontoxic goiter, unspecified: Secondary | ICD-10-CM | POA: Diagnosis not present

## 2017-05-04 ENCOUNTER — Telehealth: Payer: Self-pay | Admitting: Family Medicine

## 2017-05-04 NOTE — Telephone Encounter (Signed)
Copied from [place] [id number]. Topic: Quick Communication - See Telephone Encounter >> May 04, 2017  3:27 PM Ether Griffins B wrote: CRM for notification. See Telephone encounter for:  Pt has been around great grandkids who had the flu last week now they are having symptoms of sore throat, ear pain, low grade fever, and runny nose. They are hoping tama flu can be called in.  05/04/17.

## 2017-05-06 NOTE — Telephone Encounter (Signed)
Just now seeing the message. Please call patient to find out if she developed symptoms, or still wants it for prevention

## 2017-05-07 ENCOUNTER — Telehealth: Payer: Self-pay | Admitting: Family Medicine

## 2017-05-07 MED ORDER — OSELTAMIVIR PHOSPHATE 75 MG PO CAPS: 75.0000 mg | ORAL_CAPSULE | Freq: Two times a day (BID) | ORAL | 0 refills | Status: DC

## 2017-05-07 NOTE — Telephone Encounter (Signed)
Patient notified of Tamiflu sent to her pharmacy and this medication might not be very effective if symptoms have persisted over 48 hours.

## 2017-05-07 NOTE — Telephone Encounter (Signed)
If she is having symptoms she needs to take one twice daily for 5 days. It will only help if symptoms started within the past 2 days

## 2017-05-07 NOTE — Telephone Encounter (Signed)
Copied from [place] [id number]. Topic: Quick Communication - See Telephone Encounter >> May 07, 2017  8:34 AM [place], Laban Emperor wrote: CRM for notification. See Telephone encounter for: Pt husband Fritz Pickerel would like to know if the provider would call the pt in some tamiflu. Husband was tested positive for the flu Sunday.  [id number] [name], [place] [phone] (Phone) [phone] (Fax)    05/07/17.

## 2017-05-07 NOTE — Telephone Encounter (Signed)
Patient husband went to the [place] and test positive. Also she has been exposed to her great grand kids, she would like preventative medication sent into her pharmacy. Patient has been having a cough, congestion, scratchy throat, and a low grade fever.

## 2017-05-08 ENCOUNTER — Ambulatory Visit (INDEPENDENT_AMBULATORY_CARE_PROVIDER_SITE_OTHER): Payer: Medicare Other | Admitting: Orthopaedic Surgery

## 2017-05-11 ENCOUNTER — Ambulatory Visit (INDEPENDENT_AMBULATORY_CARE_PROVIDER_SITE_OTHER): Payer: Medicare Other | Admitting: Family Medicine

## 2017-05-11 ENCOUNTER — Encounter: Payer: Self-pay | Admitting: Family Medicine

## 2017-05-11 VITALS — BP 130/80 | HR 88 | Temp 98.6°F | Resp 16 | Ht 64.0 in | Wt 222.9 lb

## 2017-05-11 DIAGNOSIS — J4541 Moderate persistent asthma with (acute) exacerbation: Secondary | ICD-10-CM

## 2017-05-11 DIAGNOSIS — J0110 Acute frontal sinusitis, unspecified: Secondary | ICD-10-CM | POA: Diagnosis not present

## 2017-05-11 DIAGNOSIS — J111 Influenza due to unidentified influenza virus with other respiratory manifestations: Secondary | ICD-10-CM | POA: Diagnosis not present

## 2017-05-11 DIAGNOSIS — J410 Simple chronic bronchitis: Secondary | ICD-10-CM

## 2017-05-11 MED ORDER — BENZONATATE 100 MG PO CAPS: 100.0000 mg | ORAL_CAPSULE | Freq: Three times a day (TID) | ORAL | 0 refills | Status: DC | PRN

## 2017-05-11 MED ORDER — LEVALBUTEROL HCL 1.25 MG/3ML IN NEBU: 1.2500 mg | INHALATION_SOLUTION | RESPIRATORY_TRACT | 4 refills | Status: DC | PRN

## 2017-05-11 MED ORDER — DOXYCYCLINE HYCLATE 100 MG PO TABS: 100.0000 mg | ORAL_TABLET | Freq: Two times a day (BID) | ORAL | 0 refills | Status: AC

## 2017-05-11 NOTE — Progress Notes (Signed)
Name: Jacqueline Lucas   MRN: [id number]    DOB: [birth year]   Date:05/11/2017       Progress Note  Subjective  Chief Complaint  Chief Complaint  Patient presents with  . Influenza    cough, congested with green sputum, taking Tamiflu, husband tested positive    HPI  Pt presents with concern for ongoing respiratory symptoms x7 days.  She was given tamiflu by PCP Dr. Ancil Boozer 5 days ago - finished this morning and is feeling a little better.  Still having body aches, significant coughing (green sputum production), ribs are hurting from coughing so much, decreased appetite and mild intermittent nausea, shortness of breath has been increased lately.  Denies nasal congestion or chest pain, no vomiting. Taking dulera as prescribed; only using Xopenex once a day.  Patient Active Problem List   Diagnosis Date Noted  . Varicose veins of both lower extremities with pain 04/30/2017  . Claudication ([place]) 04/30/2017  . Arthritis of carpometacarpal [facility]) joint of left thumb 05/12/2016  . Bilateral thumb pain 05/01/2016  . Chronic bronchitis ([place]) 04/14/2016  . Anemia 02/03/2016  . Status post bilateral total hip replacement 01/10/2016  . Sicca ([place]) 01/10/2016  . Age related osteoporosis 01/10/2016  . Hip arthritis 08/16/2015  . Microscopic hematuria 07/30/2015  . Vaginal atrophy 07/30/2015  . GAD (generalized anxiety disorder) 07/08/2015  . Moderate episode of recurrent major depressive disorder ([place]) 07/08/2015  . Migraine with aura and without status migrainosus, not intractable 07/08/2015  . Dyslipidemia 07/08/2015  . Degenerative arthritis of right knee 03/16/2015  . Atrophic vaginitis 01/31/2015  . Recurrent UTI 12/31/2014  . IBS (irritable bowel syndrome) 10/29/2014  . Asthma, moderate persistent 09/08/2014  . Allergic state 09/07/2014  . Colon polyp 09/07/2014  . Hemorrhoid 09/07/2014  . Constrictive tenosynovitis 03/12/2014  . H/O total knee replacement, bilateral 07/31/2013  .  Angina pectoris ([place]) 11/28/2012  . Anxiety 11/28/2012  . Acid reflux 11/28/2012  . Cardiac murmur 11/28/2012  . BP (high blood pressure) 11/28/2012  . Cannot sleep 11/28/2012  . Adiposity 11/28/2012  . Arthritis, degenerative 11/28/2012  . Restless leg 11/28/2012  . Supraventricular tachycardia ([place]) 11/28/2012  . Fibromyalgia 11/28/2012    Social History   Tobacco Use  . Smoking status: Never Smoker  . Smokeless tobacco: Never Used  Substance Use Topics  . Alcohol use: No    Alcohol/week: 0.0 oz     Current Outpatient Medications:  .  aspirin 81 MG chewable tablet, Chew 81 mg by mouth daily., Disp: , Rfl:  .  atorvastatin (LIPITOR) 40 MG tablet, TAKE 1 TABLET AT BEDTIME, Disp: 90 tablet, Rfl: 1 .  baclofen (LIORESAL) 10 MG tablet, TAKE ONE TABLET AT 7AM AND ONE TABLET AT 2PM AS NEEDED FOR MUSCLE SPASM, Disp: 180 tablet, Rfl: 1 .  Cholecalciferol (D3-1000) 1000 units tablet, Take 1,000 Units by mouth at bedtime. , Disp: , Rfl:  .  cycloSPORINE (RESTASIS) 0.05 % ophthalmic emulsion, Place 1 drop into both eyes 2 (two) times daily. , Disp: , Rfl:  .  diclofenac sodium (VOLTAREN) 1 % GEL, Apply 4 g topically 3 (three) times daily as needed. Voltaren Gel 4 grams to 3 large joints upto TID prn 10 TUBES with 1 refills, Disp: 10 Tube, Rfl: 1 .  divalproex (DEPAKOTE) 250 MG DR tablet, Take 1 tablet (250 mg total) 2 (two) times daily by mouth., Disp: 180 tablet, Rfl: 1 .  estradiol (ESTRACE VAGINAL) 0.1 MG/GM vaginal cream, Apply 0.5mg  (pea-sized amount)  just inside the vaginal introitus with a finger-tip every night for two weeks and then Monday, Wednesday and Friday nights., Disp: 30 g, Rfl: 12 .  gabapentin (NEURONTIN) 300 MG capsule, Take 3 capsules (900 mg total) by mouth 3 (three) times daily., Disp: 810 capsule, Rfl: 0 .  ibuprofen (ADVIL,MOTRIN) 600 MG tablet, 600 mg 2 (two) times daily. , Disp: , Rfl:  .  lansoprazole (PREVACID) 30 MG capsule, Take 1 capsule (30 mg total) daily  by mouth., Disp: 90 capsule, Rfl: 1 .  levalbuterol (XOPENEX) 1.25 MG/3ML nebulizer solution, Take 1.25 mg by nebulization every 4 (four) hours as needed for wheezing., Disp: 72 mL, Rfl: 4 .  levothyroxine (SYNTHROID) 88 MCG tablet, Take 88 mcg by mouth daily before breakfast. , Disp: , Rfl:  .  magnesium oxide (MAG-OX) 400 MG tablet, Take 400 mg by mouth at bedtime. Reported on 07/28/2015, Disp: , Rfl:  .  metFORMIN (GLUCOPHAGE-XR) 500 MG 24 hr tablet, Take 1 tablet (500 mg total) by mouth daily with breakfast., Disp: 30 tablet, Rfl: 0 .  metoprolol tartrate (LOPRESSOR) 25 MG tablet, TAKE 1 TABLET BY MOUTH TWICE A DAY, Disp: 180 tablet, Rfl: 1 .  mometasone-formoterol (DULERA) 200-5 MCG/ACT AERO, Inhale 2 puffs into the lungs 2 (two) times daily. Reported on 07/28/2015, Disp: 3 Inhaler, Rfl: 0 .  montelukast (SINGULAIR) 10 MG tablet, Take 1 tablet (10 mg total) by mouth at bedtime., Disp: 90 tablet, Rfl: 1 .  olmesartan-hydrochlorothiazide (BENICAR HCT) 40-12.5 MG tablet, Take 1 tablet by mouth daily., Disp: 90 tablet, Rfl: 0 .  omega-3 acid ethyl esters (LOVAZA) 1 g capsule, Take 2 capsules (2 g total) by mouth daily., Disp: 360 capsule, Rfl: 1 .  ondansetron (ZOFRAN) 4 MG tablet, Take 1 tablet (4 mg total) by mouth every 8 (eight) hours as needed for nausea or vomiting., Disp: 20 tablet, Rfl: 0 .  oseltamivir (TAMIFLU) 75 MG capsule, Take 1 capsule (75 mg total) by mouth 2 (two) times daily., Disp: 10 capsule, Rfl: 0 .  Polyethyl Glycol-Propyl Glycol 0.4-0.3 % SOLN, Apply to eye., Disp: , Rfl:  .  polyethylene glycol powder (GLYCOLAX/MIRALAX) powder, 255 grams one bottle for capsule endoscopy prep, Disp: 255 g, Rfl: 0 .  Tetrahydrozoline HCl (EYE DROPS OP), Apply to eye., Disp: , Rfl:  .  venlafaxine XR (EFFEXOR-XR) 150 MG 24 hr capsule, Take 1 capsule (150 mg total) by mouth daily with breakfast., Disp: 90 capsule, Rfl: 1 .  vitamin B-12 (CYANOCOBALAMIN) 1000 MCG tablet, Take 1,000 mcg by mouth at  bedtime., Disp: , Rfl:   Allergies  Allergen Reactions  . Morphine Nausea And Vomiting    Ok with nausea medication  . Diazepam Nausea And Vomiting  . Lyrica [Pregabalin] Other (See Comments)    Joint swelling  . Latex Rash  . Rash Away  [Petrolatum-Zinc Oxide] Rash and Swelling  . Salicylates Other (See Comments)    Other reaction(s): Headache  . Tape Rash    Please use paper tape    ROS  Ten systems reviewed and is negative except as mentioned in HPI  Objective  Vitals:   05/11/17 1359  BP: 130/80  Pulse: 88  Resp: 16  Temp: 98.6 F (37 C)  TempSrc: Oral  SpO2: 94%  Weight: 222 lb 14.4 oz (101.1 kg)  Height: 5\' 4"  (1.626 m)   Body mass index is 38.26 kg/m.  Nursing Note and Vital Signs reviewed.  Physical Exam  Constitutional: Patient appears well-developed and well-nourished. Obese.  No distress.  HEENT: head atraumatic, normocephalic, pupils equal and reactive to light, EOM's intact, TM's without erythema or bulging, positive for frontal sinus tenderness, neck supple with mild lymphadenopathy, oropharynx pink and moist without exudate Cardiovascular: Normal rate, regular rhythm, S1/S2 present.  No murmur or rub heard. No BLE edema. Pulmonary/Chest: Effort normal and breath sounds slightly diminished at the bases. No respiratory distress or retractions.  Congested cough present throughout examination. Psychiatric: Patient has a normal mood and affect. behavior is normal. Judgment and thought content normal.  No results found for this or any previous visit (from the past 72 hour(s)).  Assessment & Plan  1. Moderate persistent asthma with acute exacerbation - levalbuterol (XOPENEX) 1.25 MG/3ML nebulizer solution; Take 1.25 mg by nebulization every 4 (four) hours as needed for wheezing.  Dispense: 72 mL; Refill: 4 - benzonatate (TESSALON) 100 MG capsule; Take 1 capsule (100 mg total) by mouth 3 (three) times daily as needed for cough.  Dispense: 30 capsule;  Refill: 0  2. Influenza - Completed Tamiflu  3. Simple chronic bronchitis (HCC) - levalbuterol (XOPENEX) 1.25 MG/3ML nebulizer solution; Take 1.25 mg by nebulization every 4 (four) hours as needed for wheezing.  Dispense: 72 mL; Refill: 4 - benzonatate (TESSALON) 100 MG capsule; Take 1 capsule (100 mg total) by mouth 3 (three) times daily as needed for cough.  Dispense: 30 capsule; Refill: 0  4. Acute non-recurrent frontal sinusitis - doxycycline (VIBRA-TABS) 100 MG tablet; Take 1 tablet (100 mg total) by mouth 2 (two) times daily for 7 days.  Dispense: 14 tablet; Refill: 0  -Red flags and when to present for emergency care or RTC including fever >101.27F, chest pain, shortness of breath, new/worsening/un-resolving symptoms, reviewed with patient at time of visit. Follow up and care instructions discussed and provided in AVS.

## 2017-05-11 NOTE — Patient Instructions (Addendum)
Sinusitis, Adult Sinusitis is soreness and inflammation of your sinuses. Sinuses are hollow spaces in the bones around your face. They are located:  Around your eyes.  In the middle of your forehead.  Behind your nose.  In your cheekbones.  Your sinuses and nasal passages are lined with a stringy fluid (mucus). Mucus normally drains out of your sinuses. When your nasal tissues get inflamed or swollen, the mucus can get trapped or blocked so air cannot flow through your sinuses. This lets bacteria, viruses, and funguses grow, and that leads to infection. Follow these instructions at home: Medicines  Take, use, or apply over-the-counter and prescription medicines only as told by your doctor. These may include nasal sprays.  If you were prescribed an antibiotic medicine, take it as told by your doctor. Do not stop taking the antibiotic even if you start to feel better. Hydrate and Humidify  Drink enough water to keep your pee (urine) clear or pale yellow.  Use a cool mist humidifier to keep the humidity level in your home above 50%.  Breathe in steam for 10-15 minutes, 3-4 times a day or as told by your doctor. You can do this in the bathroom while a hot shower is running.  Try not to spend time in cool or dry air. Rest  Rest as much as possible.  Sleep with your head raised (elevated).  Make sure to get enough sleep each night. General instructions  Put a warm, moist washcloth on your face 3-4 times a day or as told by your doctor. This will help with discomfort.  Wash your hands often with soap and water. If there is no soap and water, use hand sanitizer.  Do not smoke. Avoid being around people who are smoking (secondhand smoke).  Keep all follow-up visits as told by your doctor. This is important. Contact a doctor if:  You have a fever.  Your symptoms get worse.  Your symptoms do not get better within 10 days. Get help right away if:  You have a very bad  headache.  You cannot stop throwing up (vomiting).  You have pain or swelling around your face or eyes.  You have trouble seeing.  You feel confused.  Your neck is stiff.  You have trouble breathing. This information is not intended to replace advice given to you by your health care provider. Make sure you discuss any questions you have with your health care provider. Document Released: 08/02/2007 Document Revised: 10/10/2015 Document Reviewed: 12/09/2014 Elsevier Interactive Patient Education  2018 [place] A cool mist vaporizer is a device that releases a cool mist into the air. If you have a cough or a cold, using a vaporizer may help relieve your symptoms. The mist adds moisture to the air, which may help thin your mucus and make it less sticky. When your mucus is thin and less sticky, it easier for you to breathe and to cough up secretions. Do not use a vaporizer if you are allergic to mold. Follow these instructions at home:  Follow the instructions that come with the vaporizer.  Do not use anything other than distilled water in the vaporizer.  Do not run the vaporizer all of the time. Doing that can cause mold or bacteria to grow in the vaporizer.  Clean the vaporizer after each time that you use it.  Clean and dry the vaporizer well before storing it.  Stop using the vaporizer if your breathing symptoms get worse. This  information is not intended to replace advice given to you by your health care provider. Make sure you discuss any questions you have with your health care provider. Document Released: 11/11/2003 Document Revised: 09/03/2015 Document Reviewed: 05/15/2015 Elsevier Interactive Patient Education  [employer].

## 2017-05-15 ENCOUNTER — Encounter: Payer: Self-pay | Admitting: Rheumatology

## 2017-05-15 ENCOUNTER — Telehealth: Payer: Self-pay

## 2017-05-15 ENCOUNTER — Ambulatory Visit (INDEPENDENT_AMBULATORY_CARE_PROVIDER_SITE_OTHER): Payer: Medicare Other | Admitting: Rheumatology

## 2017-05-15 VITALS — BP 122/65 | HR 61 | Resp 17 | Ht 64.0 in | Wt 225.0 lb

## 2017-05-15 DIAGNOSIS — I209 Angina pectoris, unspecified: Secondary | ICD-10-CM

## 2017-05-15 DIAGNOSIS — Z96653 Presence of artificial knee joint, bilateral: Secondary | ICD-10-CM | POA: Diagnosis not present

## 2017-05-15 DIAGNOSIS — M1812 Unilateral primary osteoarthritis of first carpometacarpal joint, left hand: Secondary | ICD-10-CM

## 2017-05-15 DIAGNOSIS — G2581 Restless legs syndrome: Secondary | ICD-10-CM

## 2017-05-15 DIAGNOSIS — Z862 Personal history of diseases of the blood and blood-forming organs and certain disorders involving the immune mechanism: Secondary | ICD-10-CM

## 2017-05-15 DIAGNOSIS — N390 Urinary tract infection, site not specified: Secondary | ICD-10-CM | POA: Diagnosis not present

## 2017-05-15 DIAGNOSIS — Z96643 Presence of artificial hip joint, bilateral: Secondary | ICD-10-CM

## 2017-05-15 DIAGNOSIS — M3501 Sicca syndrome with keratoconjunctivitis: Secondary | ICD-10-CM | POA: Diagnosis not present

## 2017-05-15 DIAGNOSIS — M797 Fibromyalgia: Secondary | ICD-10-CM

## 2017-05-15 DIAGNOSIS — Z8719 Personal history of other diseases of the digestive system: Secondary | ICD-10-CM | POA: Diagnosis not present

## 2017-05-15 DIAGNOSIS — Z8679 Personal history of other diseases of the circulatory system: Secondary | ICD-10-CM | POA: Diagnosis not present

## 2017-05-15 DIAGNOSIS — M7061 Trochanteric bursitis, right hip: Secondary | ICD-10-CM

## 2017-05-15 DIAGNOSIS — Z8659 Personal history of other mental and behavioral disorders: Secondary | ICD-10-CM

## 2017-05-15 DIAGNOSIS — M8589 Other specified disorders of bone density and structure, multiple sites: Secondary | ICD-10-CM | POA: Diagnosis not present

## 2017-05-15 DIAGNOSIS — M546 Pain in thoracic spine: Secondary | ICD-10-CM | POA: Diagnosis not present

## 2017-05-15 DIAGNOSIS — M7062 Trochanteric bursitis, left hip: Secondary | ICD-10-CM

## 2017-05-15 MED ORDER — TRIAMCINOLONE ACETONIDE 40 MG/ML IJ SUSP
40.0000 mg | INTRAMUSCULAR | Status: AC | PRN
  Administered 2017-05-15: 40 mg via INTRA_ARTICULAR

## 2017-05-15 MED ORDER — LIDOCAINE HCL 1 % IJ SOLN
1.5000 mL | INTRAMUSCULAR | Status: AC | PRN
  Administered 2017-05-15: 1.5 mL

## 2017-05-15 NOTE — Telephone Encounter (Signed)
Copied from [place]. Topic: Inquiry >> May 15, 2017 10:24 AM Pricilla Handler wrote: Reason for CRM: Nate with [facility] [phone]) called wanting TSH and any Thyroid labs available for this patient. Nate would like this information faxed to [phone] asap.       Thank You!!!  I contacted Nate to inform him that this patient has not had any recent labs drawn regarding her Thyroid, but that I would give this patient a call to see if she could stop by some time this week to have them drawn. I called this patient but there was no answer. A message was left for her stating why I called and for her to give [place] a call when she got the chance.

## 2017-05-16 ENCOUNTER — Other Ambulatory Visit: Payer: Self-pay | Admitting: Rheumatology

## 2017-05-16 NOTE — Telephone Encounter (Signed)
Last Visit: 05/15/17 Next Visit: 11/20/17  Okay to refill per Dr. Deveshwar 

## 2017-05-20 ENCOUNTER — Other Ambulatory Visit: Payer: Self-pay | Admitting: Family Medicine

## 2017-05-20 DIAGNOSIS — F331 Major depressive disorder, recurrent, moderate: Secondary | ICD-10-CM

## 2017-05-20 DIAGNOSIS — F411 Generalized anxiety disorder: Secondary | ICD-10-CM

## 2017-05-23 ENCOUNTER — Ambulatory Visit (INDEPENDENT_AMBULATORY_CARE_PROVIDER_SITE_OTHER): Payer: Medicare Other | Admitting: Orthopedic Surgery

## 2017-05-23 DIAGNOSIS — M7540 Impingement syndrome of unspecified shoulder: Secondary | ICD-10-CM | POA: Diagnosis not present

## 2017-05-23 DIAGNOSIS — I209 Angina pectoris, unspecified: Secondary | ICD-10-CM

## 2017-05-24 DIAGNOSIS — M150 Primary generalized (osteo)arthritis: Secondary | ICD-10-CM | POA: Diagnosis not present

## 2017-05-24 DIAGNOSIS — M810 Age-related osteoporosis without current pathological fracture: Secondary | ICD-10-CM | POA: Diagnosis not present

## 2017-05-24 DIAGNOSIS — Z6839 Body mass index (BMI) 39.0-39.9, adult: Secondary | ICD-10-CM | POA: Diagnosis not present

## 2017-05-24 DIAGNOSIS — F411 Generalized anxiety disorder: Secondary | ICD-10-CM | POA: Diagnosis not present

## 2017-05-24 DIAGNOSIS — E049 Nontoxic goiter, unspecified: Secondary | ICD-10-CM | POA: Diagnosis not present

## 2017-05-24 DIAGNOSIS — J45909 Unspecified asthma, uncomplicated: Secondary | ICD-10-CM | POA: Diagnosis not present

## 2017-05-24 DIAGNOSIS — E039 Hypothyroidism, unspecified: Secondary | ICD-10-CM | POA: Diagnosis not present

## 2017-05-24 DIAGNOSIS — I10 Essential (primary) hypertension: Secondary | ICD-10-CM | POA: Diagnosis not present

## 2017-05-24 DIAGNOSIS — K219 Gastro-esophageal reflux disease without esophagitis: Secondary | ICD-10-CM | POA: Diagnosis not present

## 2017-05-24 DIAGNOSIS — E6609 Other obesity due to excess calories: Secondary | ICD-10-CM | POA: Diagnosis not present

## 2017-05-24 DIAGNOSIS — M797 Fibromyalgia: Secondary | ICD-10-CM | POA: Diagnosis not present

## 2017-05-27 ENCOUNTER — Encounter (INDEPENDENT_AMBULATORY_CARE_PROVIDER_SITE_OTHER): Payer: Self-pay | Admitting: Orthopedic Surgery

## 2017-05-27 NOTE — Progress Notes (Signed)
Office Visit Note   Patient: Jacqueline Lucas           Date of Birth: 1950/04/06           MRN: [id number] Visit Date: 05/23/2017 Requested by: [name], [address] [place], [place] [address] PCP: [name], MD  Subjective: Chief Complaint  Patient presents with  . Shoulder Pain    bilateral    HPI: Jacqueline Lucas is a patient with bilateral shoulder pain left worse than right.  She would like to discuss injections versus surgery.  She has a SLAP tear on the left-hand side and about an 80% thickness tear of the rotator cuff on the right.  On that left hand side the rotator cuff looks intact.  Her most symptomatic shoulder is the left hands shoulder.  She has to hire someone to help her clean because she is unable to do it due to her shoulders.  Difficult for her to lay on that left hand side.  She is not sleeping at night.              ROS: All systems reviewed are negative as they relate to the chief complaint within the history of present illness.  Patient denies  fevers or chills.   Assessment & Plan: Visit Diagnoses:  1. Impingement syndrome of shoulder region, unspecified laterality     Plan: Impression is SLAP tear left shoulder which is symptomatic.  Rotator cuff tear on the right I think is less symptomatic particularly based on her exam today.  If she were to have intervention on that left shoulder it would be arthroscopy with arthroscopic biceps tenodesis and subacromial decompression.  I do not think she really needs to have that done right away until she clinically would like to have it done.  She is going to consider her options.  In the meantime we will try glenohumeral injection today into the left shoulder.  Follow-up with me as needed.  Follow-Up Instructions: Return if symptoms worsen or fail to improve.   Orders:  No orders of the defined types were placed in this encounter.  No orders of the defined types were placed in this  encounter.     Procedures: No procedures performed   Clinical Data: No additional findings.  Objective: Vital Signs: There were no vitals taken for this visit.  Physical Exam:   Constitutional: Patient appears well-developed HEENT:  Head: Normocephalic Eyes:EOM are normal Neck: Normal range of motion Cardiovascular: Normal rate Pulmonary/chest: Effort normal Neurologic: Patient is alert Skin: Skin is warm Psychiatric: Patient has normal mood and affect    Ortho Exam: Orthopedic exam demonstrates full active and passive range of motion of both shoulders particularly in relation to external rotation passively at 15 degrees of abduction.  No evidence of frozen shoulder today.  She has pretty smooth passive range of motion on the right.  On the left-hand side is a little bit more painful.  O'Brien's testing is positive on the left negative on the right.  No discrete AC joint tenderness to direct palpation or with crossarm adduction bilaterally.  No other masses lymph adenopathy or skin changes noted in the shoulder girdle regions.  Cervical spine range of motion is full.  No definite paresthesias C5-T1.  Specialty Comments:  No specialty comments available.  Imaging: No results found.   PMFS History: Patient Active Problem List   Diagnosis Date Noted  . Varicose veins of both lower extremities with pain 04/30/2017  .  Claudication ([place]) 04/30/2017  . Arthritis of carpometacarpal [facility]) joint of left thumb 05/12/2016  . Bilateral thumb pain 05/01/2016  . Chronic bronchitis ([place]) 04/14/2016  . Anemia 02/03/2016  . Status post bilateral total hip replacement 01/10/2016  . Sicca ([place]) 01/10/2016  . Age related osteoporosis 01/10/2016  . Hip arthritis 08/16/2015  . Microscopic hematuria 07/30/2015  . Vaginal atrophy 07/30/2015  . GAD (generalized anxiety disorder) 07/08/2015  . Moderate episode of recurrent major depressive disorder ([place]) 07/08/2015  . Migraine with aura  and without status migrainosus, not intractable 07/08/2015  . Dyslipidemia 07/08/2015  . Degenerative arthritis of right knee 03/16/2015  . Atrophic vaginitis 01/31/2015  . Recurrent UTI 12/31/2014  . IBS (irritable bowel syndrome) 10/29/2014  . Asthma, moderate persistent 09/08/2014  . Allergic state 09/07/2014  . Colon polyp 09/07/2014  . Hemorrhoid 09/07/2014  . Constrictive tenosynovitis 03/12/2014  . H/O total knee replacement, bilateral 07/31/2013  . Angina pectoris ([place]) 11/28/2012  . Anxiety 11/28/2012  . Acid reflux 11/28/2012  . Cardiac murmur 11/28/2012  . BP (high blood pressure) 11/28/2012  . Cannot sleep 11/28/2012  . Adiposity 11/28/2012  . Arthritis, degenerative 11/28/2012  . Restless leg 11/28/2012  . Supraventricular tachycardia ([place]) 11/28/2012  . Fibromyalgia 11/28/2012   Past Medical History:  Diagnosis Date  . Allergy   . Allergy to adhesive tape    Allergy to paper tape as well  . Alzheimer disease   . Anemia   . Anginal pain ([place])   . Anxiety   . Arthritis   . Asthma   . Bronchitis   . Cataract   . Chronic nausea   . Claustrophobia   . DDD (degenerative disc disease), lumbar   . Depression   . Dry eyes    uses Restasis  . Fibromyalgia   . GERD (gastroesophageal reflux disease)   . H/O bladder infections   . Heart murmur   . Hematuria   . Hyperlipidemia   . Hypertension   . Hypothyroidism   . IBS (irritable bowel syndrome)   . Insomnia   . Lumbar neuritis   . Migraines   . Obesity   . Osteoporosis   . Palpitation   . PONV (postoperative nausea and vomiting)   . RLS (restless legs syndrome)   . Shoulder fracture, right   . Sicca ([place])   . Tachycardia   . Thyroid disease     Family History  Problem Relation Age of Onset  . Diabetes Mother   . Hypertension Mother   . Heart disease Mother   . Diabetes Father   . Cancer Father   . Hypertension Sister   . Depression Sister   . Cancer Brother   . Heart disease Brother   .  Stroke Son   . Fibromyalgia Daughter   . Bladder Cancer Neg Hx   . Kidney disease Neg Hx   . Kidney cancer Neg Hx     Past Surgical History:  Procedure Laterality Date  . ABDOMINAL HYSTERECTOMY     due to cancer of ovaries  . BACK SURGERY N/A 2014   x 5  . CARDIAC CATHETERIZATION     no PCI; 12/18/12 ([facility], Dr. Marcello Moores): EP study with ablation. No coronary angiography done then.  . COLONOSCOPY W/ POLYPECTOMY    . COLONOSCOPY WITH PROPOFOL N/A 02/04/2016   Procedure: COLONOSCOPY WITH PROPOFOL;  Surgeon: Robert Bellow, MD;  Location[facility] ENDOSCOPY;  Service: Endoscopy;  Laterality: N/A;  . ESOPHAGOGASTRODUODENOSCOPY (EGD) WITH PROPOFOL N/A  02/04/2016   Procedure: ESOPHAGOGASTRODUODENOSCOPY (EGD) WITH PROPOFOL;  Surgeon: Robert Bellow, MD;  Location[facility] ENDOSCOPY;  Service: Endoscopy;  Laterality: N/A;  . FINGER ARTHROSCOPY WITH CARPOMETACARPEL ([place]) ARTHROPLASTY Left 05/01/2016   Dr. Erlinda Hong  . GIVENS CAPSULE STUDY N/A 05/02/2016   Procedure: GIVENS CAPSULE STUDY;  Surgeon: Manya Silvas, MD;  Location[facility] ENDOSCOPY;  Service: Endoscopy;  Laterality: N/A;  . HAND SURGERY    . HEMORRHOID SURGERY    . HIP ARTHROPLASTY    . JOINT REPLACEMENT Bilateral    knee  . KNEE ARTHROPLASTY    . REPLACEMENT TOTAL KNEE Left   . SPINE SURGERY     x 2  . tendonititis     right elbow, right wrist  . TOTAL HIP ARTHROPLASTY Left 2013  . TOTAL HIP ARTHROPLASTY Right 08/16/2015   Procedure: TOTAL HIP ARTHROPLASTY ANTERIOR APPROACH;  Surgeon: Meredith Pel, MD;  Location: [place];  Service: Orthopedics;  Laterality: Right;  . TOTAL KNEE ARTHROPLASTY Right 03/16/2015   Procedure: RIGHT TOTAL KNEE ARTHROPLASTY, PCL SACRIFICING;  Surgeon: Meredith Pel, MD;  Location: [place];  Service: Orthopedics;  Laterality: Right;   Social History   Occupational History  . Not on file  Tobacco Use  . Smoking status: Never Smoker  . Smokeless tobacco: Never Used  Substance and Sexual Activity  .  Alcohol use: No    Alcohol/week: 0.0 oz  . Drug use: No  . Sexual activity: Never

## 2017-05-31 ENCOUNTER — Other Ambulatory Visit: Payer: Self-pay | Admitting: Family Medicine

## 2017-05-31 DIAGNOSIS — E785 Hyperlipidemia, unspecified: Secondary | ICD-10-CM

## 2017-05-31 MED ORDER — ATORVASTATIN CALCIUM 40 MG PO TABS: 40.0000 mg | ORAL_TABLET | Freq: Every day | ORAL | 1 refills | Status: DC

## 2017-05-31 NOTE — Telephone Encounter (Signed)
Req. For Atorvastatin refill;  Last refill 01/26/17; #90 with refill x 1 Last office visit 05/11/17  Acute                          04/13/17  Follow-up PCP: Dr. Ancil Boozer Pharmacy: [facility] [address] In [place]

## 2017-05-31 NOTE — Telephone Encounter (Signed)
Copied from [place] [phone]. Topic: Quick Communication - Rx Refill/Question >> May 31, 2017 11:28 AM Margot Ables wrote: Medication: atorvastatin - has 5 days on hand, takes 1/day - requesting 90 day supply Has the patient contacted their pharmacy? No - pharmacy change - does NOT want to use mail order Preferred Pharmacy (with phone number or street name): [id number] [name], [place] [phone] (Phone) [phone] (Fax)

## 2017-06-04 ENCOUNTER — Other Ambulatory Visit: Payer: Self-pay | Admitting: Rheumatology

## 2017-06-04 ENCOUNTER — Ambulatory Visit (INDEPENDENT_AMBULATORY_CARE_PROVIDER_SITE_OTHER): Payer: Medicare Other | Admitting: Vascular Surgery

## 2017-06-04 ENCOUNTER — Encounter (INDEPENDENT_AMBULATORY_CARE_PROVIDER_SITE_OTHER): Payer: Self-pay | Admitting: Vascular Surgery

## 2017-06-04 ENCOUNTER — Ambulatory Visit (INDEPENDENT_AMBULATORY_CARE_PROVIDER_SITE_OTHER): Payer: Medicare Other

## 2017-06-04 VITALS — BP 139/69 | HR 71 | Resp 16 | Ht 64.0 in | Wt 216.6 lb

## 2017-06-04 DIAGNOSIS — I10 Essential (primary) hypertension: Secondary | ICD-10-CM

## 2017-06-04 DIAGNOSIS — J4541 Moderate persistent asthma with (acute) exacerbation: Secondary | ICD-10-CM | POA: Diagnosis not present

## 2017-06-04 DIAGNOSIS — I83813 Varicose veins of bilateral lower extremities with pain: Secondary | ICD-10-CM

## 2017-06-04 DIAGNOSIS — I209 Angina pectoris, unspecified: Secondary | ICD-10-CM

## 2017-06-04 DIAGNOSIS — I872 Venous insufficiency (chronic) (peripheral): Secondary | ICD-10-CM | POA: Diagnosis not present

## 2017-06-04 DIAGNOSIS — I83812 Varicose veins of left lower extremities with pain: Secondary | ICD-10-CM | POA: Diagnosis not present

## 2017-06-04 DIAGNOSIS — I83811 Varicose veins of right lower extremities with pain: Secondary | ICD-10-CM | POA: Diagnosis not present

## 2017-06-04 DIAGNOSIS — I25118 Atherosclerotic heart disease of native coronary artery with other forms of angina pectoris: Secondary | ICD-10-CM | POA: Diagnosis not present

## 2017-06-07 ENCOUNTER — Other Ambulatory Visit: Payer: Self-pay | Admitting: Family Medicine

## 2017-06-07 DIAGNOSIS — F331 Major depressive disorder, recurrent, moderate: Secondary | ICD-10-CM

## 2017-06-07 DIAGNOSIS — F411 Generalized anxiety disorder: Secondary | ICD-10-CM

## 2017-06-07 NOTE — Telephone Encounter (Signed)
Refill request for Hypertension medication:  Venlafaxine XR 150 mg 24 hr  Last office visit pertaining to hypertension: 04/13/2017  BP Readings from Last 3 Encounters:  06/04/17 139/69  05/15/17 122/65  05/11/17 130/80     Lab Results  Component Value Date   CREATININE 0.80 12/04/2016   BUN 19 12/04/2016   NA 140 12/04/2016   K 4.3 12/04/2016   CL 102 12/04/2016   CO2 29 12/04/2016    Follow-ups on file. 08/13/17

## 2017-06-07 NOTE — Telephone Encounter (Signed)
3 month supply sent in by Dr. Ancil Boozer in February; too soon for refill

## 2017-06-09 ENCOUNTER — Encounter (INDEPENDENT_AMBULATORY_CARE_PROVIDER_SITE_OTHER): Payer: Self-pay | Admitting: Vascular Surgery

## 2017-06-09 DIAGNOSIS — I872 Venous insufficiency (chronic) (peripheral): Secondary | ICD-10-CM | POA: Insufficient documentation

## 2017-06-09 NOTE — Progress Notes (Signed)
MRN : [id number]  Jacqueline Lucas is a 67 y.o. (06/20/1950) female who presents with chief complaint of  Chief Complaint  Patient presents with  . Follow-up    1 month bil ven reflux  .  History of Present Illness: The patient returns for followup evaluation 3 months after the initial visit. The patient continues to have pain in the lower extremities with dependency. The pain is lessened with elevation. Graduated compression stockings, Class I (20-30 mmHg), have been worn but the stockings do not eliminate the leg pain. Over-the-counter analgesics do not improve the symptoms. The degree of discomfort continues to interfere with daily activities. The patient notes the pain in the legs is causing problems with daily exercise, at the workplace and even with household activities and maintenance such as standing in the kitchen preparing meals and doing dishes.   Venous ultrasound shows normal deep venous system, no evidence of acute or chronic DVT.  Superficial reflux is present in the great saphenous veins bilaterally.  Current Meds  Medication Sig  . aspirin 81 MG chewable tablet Chew 81 mg by mouth daily.  [name] atorvastatin (LIPITOR) 40 MG tablet Take 1 tablet (40 mg total) by mouth at bedtime.  . baclofen (LIORESAL) 10 MG tablet TAKE ONE TABLET AT 7AM AND ONE TABLET AT 2PM AS NEEDED FOR MUSCLE SPASM  . benzonatate (TESSALON) 100 MG capsule Take 1 capsule (100 mg total) by mouth 3 (three) times daily as needed for cough.  . Cholecalciferol (D3-1000) 1000 units tablet Take 1,000 Units by mouth at bedtime.   . cycloSPORINE (RESTASIS) 0.05 % ophthalmic emulsion Place 1 drop into both eyes 2 (two) times daily.   . diclofenac sodium (VOLTAREN) 1 % GEL Apply 4 g topically 3 (three) times daily as needed. Voltaren Gel 4 grams to 3 large joints upto TID prn 10 TUBES with 1 refills  . divalproex (DEPAKOTE) 250 MG DR tablet Take 1 tablet (250 mg total) 2 (two) times daily by mouth.  . estradiol  (ESTRACE VAGINAL) 0.1 MG/GM vaginal cream Apply 0.5mg  (pea-sized amount)  just inside the vaginal introitus with a finger-tip every night for two weeks and then Monday, Wednesday and Friday nights.  . gabapentin (NEURONTIN) 300 MG capsule Take 3 capsules (900 mg total) by mouth 3 (three) times daily.  [name] ibuprofen (ADVIL,MOTRIN) 600 MG tablet 600 mg 2 (two) times daily.   . lansoprazole (PREVACID) 30 MG capsule Take 1 capsule (30 mg total) daily by mouth.  . levalbuterol (XOPENEX) 1.25 MG/3ML nebulizer solution Take 1.25 mg by nebulization every 4 (four) hours as needed for wheezing.  [name] levothyroxine (SYNTHROID) 88 MCG tablet Take 88 mcg by mouth daily before breakfast.   . magnesium oxide (MAG-OX) 400 MG tablet Take 400 mg by mouth at bedtime. Reported on 07/28/2015  . metFORMIN (GLUCOPHAGE-XR) 500 MG 24 hr tablet Take 1 tablet (500 mg total) by mouth daily with breakfast.  . metoprolol tartrate (LOPRESSOR) 25 MG tablet TAKE 1 TABLET BY MOUTH TWICE A DAY  . mometasone-formoterol (DULERA) 200-5 MCG/ACT AERO Inhale 2 puffs into the lungs 2 (two) times daily. Reported on 07/28/2015  . montelukast (SINGULAIR) 10 MG tablet Take 1 tablet (10 mg total) by mouth at bedtime.  [name] olmesartan-hydrochlorothiazide (BENICAR HCT) 40-12.5 MG tablet Take 1 tablet by mouth daily.  [name] omega-3 acid ethyl esters (LOVAZA) 1 g capsule Take 2 capsules (2 g total) by mouth daily.  . ondansetron (ZOFRAN) 4 MG tablet Take 1 tablet (4 mg  total) by mouth every 8 (eight) hours as needed for nausea or vomiting.  Vladimir Faster Glycol-Propyl Glycol 0.4-0.3 % SOLN Apply to eye.  . polyethylene glycol powder (GLYCOLAX/MIRALAX) powder 255 grams one bottle for capsule endoscopy prep  . Tetrahydrozoline HCl (EYE DROPS OP) Apply to eye.  . venlafaxine XR (EFFEXOR-XR) 150 MG 24 hr capsule Take 1 capsule (150 mg total) by mouth daily with breakfast.  . vitamin B-12 (CYANOCOBALAMIN) 1000 MCG tablet Take 1,000 mcg by mouth at bedtime.    Past  Medical History:  Diagnosis Date  . Allergy   . Allergy to adhesive tape    Allergy to paper tape as well  . Alzheimer disease   . Anemia   . Anginal pain ([place])   . Anxiety   . Arthritis   . Asthma   . Bronchitis   . Cataract   . Chronic nausea   . Claustrophobia   . DDD (degenerative disc disease), lumbar   . Depression   . Dry eyes    uses Restasis  . Fibromyalgia   . GERD (gastroesophageal reflux disease)   . H/O bladder infections   . Heart murmur   . Hematuria   . Hyperlipidemia   . Hypertension   . Hypothyroidism   . IBS (irritable bowel syndrome)   . Insomnia   . Lumbar neuritis   . Migraines   . Obesity   . Osteoporosis   . Palpitation   . PONV (postoperative nausea and vomiting)   . RLS (restless legs syndrome)   . Shoulder fracture, right   . Sicca ([place])   . Tachycardia   . Thyroid disease     Past Surgical History:  Procedure Laterality Date  . ABDOMINAL HYSTERECTOMY     due to cancer of ovaries  . BACK SURGERY N/A 2014   x 5  . CARDIAC CATHETERIZATION     no PCI; 12/18/12 ([facility], Dr. Marcello Moores): EP study with ablation. No coronary angiography done then.  . COLONOSCOPY W/ POLYPECTOMY    . COLONOSCOPY WITH PROPOFOL N/A 02/04/2016   Procedure: COLONOSCOPY WITH PROPOFOL;  Surgeon: Robert Bellow, MD;  Location: [facility] ENDOSCOPY;  Service: Endoscopy;  Laterality: N/A;  . ESOPHAGOGASTRODUODENOSCOPY (EGD) WITH PROPOFOL N/A 02/04/2016   Procedure: ESOPHAGOGASTRODUODENOSCOPY (EGD) WITH PROPOFOL;  Surgeon: Robert Bellow, MD;  Location: [facility] ENDOSCOPY;  Service: Endoscopy;  Laterality: N/A;  . FINGER ARTHROSCOPY WITH CARPOMETACARPEL ([place]) ARTHROPLASTY Left 05/01/2016   Dr. Erlinda Hong  . GIVENS CAPSULE STUDY N/A 05/02/2016   Procedure: GIVENS CAPSULE STUDY;  Surgeon: Manya Silvas, MD;  Location: [facility] ENDOSCOPY;  Service: Endoscopy;  Laterality: N/A;  . HAND SURGERY    . HEMORRHOID SURGERY    . HIP ARTHROPLASTY    . JOINT REPLACEMENT Bilateral    knee  . KNEE  ARTHROPLASTY    . REPLACEMENT TOTAL KNEE Left   . SPINE SURGERY     x 2  . tendonititis     right elbow, right wrist  . TOTAL HIP ARTHROPLASTY Left 2013  . TOTAL HIP ARTHROPLASTY Right 08/16/2015   Procedure: TOTAL HIP ARTHROPLASTY ANTERIOR APPROACH;  Surgeon: Meredith Pel, MD;  Location: [place];  Service: Orthopedics;  Laterality: Right;  . TOTAL KNEE ARTHROPLASTY Right 03/16/2015   Procedure: RIGHT TOTAL KNEE ARTHROPLASTY, PCL SACRIFICING;  Surgeon: Meredith Pel, MD;  Location: [place];  Service: Orthopedics;  Laterality: Right;    Social History Social History   Tobacco Use  . Smoking status: Never Smoker  .  Smokeless tobacco: Never Used  Substance Use Topics  . Alcohol use: No    Alcohol/week: 0.0 oz  . Drug use: No    Family History Family History  Problem Relation Age of Onset  . Diabetes Mother   . Hypertension Mother   . Heart disease Mother   . Diabetes Father   . Cancer Father   . Hypertension Sister   . Depression Sister   . Cancer Brother   . Heart disease Brother   . Stroke Son   . Fibromyalgia Daughter   . Bladder Cancer Neg Hx   . Kidney disease Neg Hx   . Kidney cancer Neg Hx     Allergies  Allergen Reactions  . Morphine Nausea And Vomiting    Ok with nausea medication  . Diazepam Nausea And Vomiting  . Lyrica [Pregabalin] Other (See Comments)    Joint swelling  . Latex Rash  . Rash Away  [Petrolatum-Zinc Oxide] Rash and Swelling  . Salicylates Other (See Comments)    Other reaction(s): Headache  . Tape Rash    Please use paper tape     REVIEW OF SYSTEMS (Negative unless checked)  Constitutional: [] Weight loss  [] Fever  [] Chills Cardiac: [] Chest pain   [] Chest pressure   [] Palpitations   [] Shortness of breath when laying flat   [] Shortness of breath with exertion. Vascular:  [] Pain in legs with walking   [x] Pain in legs at rest  [] History of DVT   [] Phlebitis   [x] Swelling in legs   [x] Varicose veins   [] Non-healing  ulcers Pulmonary:   [] Uses home oxygen   [] Productive cough   [] Hemoptysis   [] Wheeze  [] COPD   [] Asthma Neurologic:  [] Dizziness   [] Seizures   [] History of stroke   [] History of TIA  [] Aphasia   [] Vissual changes   [] Weakness or numbness in arm   [] Weakness or numbness in leg Musculoskeletal:   [] Joint swelling   [] Joint pain   [] Low back pain Hematologic:  [] Easy bruising  [] Easy bleeding   [] Hypercoagulable state   [] Anemic Gastrointestinal:  [] Diarrhea   [] Vomiting  [] Gastroesophageal reflux/heartburn   [] Difficulty swallowing. Genitourinary:  [] Chronic kidney disease   [] Difficult urination  [] Frequent urination   [] Blood in urine Skin:  [] Rashes   [] Ulcers  Psychological:  [] History of anxiety   []  History of major depression.  Physical Examination  Vitals:   06/04/17 1535  BP: 139/69  Pulse: 71  Resp: 16  Weight: 216 lb 9.6 oz (98.2 kg)  Height: 5\' 4"  (1.626 m)   Body mass index is 37.18 kg/m. Gen: WD/WN, NAD Head: [place]/AT, No temporalis wasting.  Ear/Nose/Throat: Hearing grossly intact, nares w/o erythema or drainage Eyes: PER, EOMI, sclera nonicteric.  Neck: Supple, no large masses.   Pulmonary:  Good air movement, no audible wheezing bilaterally, no use of accessory muscles.  Cardiac: RRR, no JVD Vascular: Large varicosities present extensively greater than 10 mm right greater than left.  Mild venous stasis changes to the legs bilaterally.  2-3 + soft pitting edema Vessel Right Left  Radial Palpable Palpable  PT Palpable Palpable  DP Palpable Palpable  Gastrointestinal: Non-distended. No guarding/no peritoneal signs.  Musculoskeletal: M/S 5/5 throughout.  No deformity or atrophy.  Neurologic: CN 2-12 intact. Symmetrical.  Speech is fluent. Motor exam as listed above. Psychiatric: Judgment intact, Mood & affect appropriate for pt's clinical situation. Dermatologic: No rashes or ulcers noted.  No changes consistent with cellulitis. Lymph : No lichenification or skin  changes of chronic  lymphedema.  CBC Lab Results  Component Value Date   WBC 8.0 12/04/2016   HGB 13.2 12/04/2016   HCT 38.6 12/04/2016   MCV 88.9 12/04/2016   PLT 230 12/04/2016    BMET    Component Value Date/Time   NA 140 12/04/2016 0857   NA 140 02/15/2015 1542   NA 138 07/18/2013 0546   K 4.3 12/04/2016 0857   K 3.9 03/02/2014 0810   CL 102 12/04/2016 0857   CL 104 07/18/2013 0546   CO2 29 12/04/2016 0857   CO2 24 07/18/2013 0546   GLUCOSE 107 (H) 12/04/2016 0857   GLUCOSE 124 (H) 07/18/2013 0546   BUN 19 12/04/2016 0857   BUN 14 07/06/2015 1046   BUN 7 07/18/2013 0546   CREATININE 0.80 12/04/2016 0857   CALCIUM 9.3 12/04/2016 0857   CALCIUM 8.7 07/18/2013 0546   GFRNONAA 76 12/04/2016 0857   GFRAA 88 12/04/2016 0857   CrCl cannot be calculated (Patient's most recent lab result is older than the maximum 21 days allowed.).  COAG Lab Results  Component Value Date   INR 1.61 (H) 03/18/2015   INR 1.18 03/17/2015   INR 1.14 03/16/2015    Radiology No results found.    Assessment/Plan 1. Varicose veins of both lower extremities with pain Recommend  I have reviewed my previous  discussion with the patient regarding  varicose veins and why they cause symptoms. Patient will continue  wearing graduated compression stockings class 1 on a daily basis, beginning first thing in the morning and removing them in the evening.    In addition, behavioral modification including elevation during the day was again discussed and this will continue.  The patient has utilized over the counter pain medications and has been exercising.  However, at this time conservative therapy has not alleviated the patient's symptoms of leg pain and swelling.  The use of the saphenous vein for heart bypass surgery was also reviewed.  The patient feels that her discomfort is so great that she is willing to proceed with ablation of the saphenous veins in their proximal segments.  Recommend:  laser ablation of the right and  left great saphenous veins to eliminate the symptoms of pain and swelling of the lower extremities caused by the severe superficial venous reflux disease.  The right leg is more severely affected and will be treated first.  2. Chronic venous insufficiency See #1  3. Essential hypertension Continue antihypertensive medications as already ordered, these medications have been reviewed and there are no changes at this time.   4. Coronary artery disease of native artery of native heart with stable angina pectoris (HCC) Continue cardiac and antihypertensive medications as already ordered and reviewed, no changes at this time.  Continue statin as ordered and reviewed, no changes at this time  Nitrates PRN for chest pain   5. Moderate persistent asthma with acute exacerbation Continue pulmonary medications and aerosols as already ordered, these medications have been reviewed and there are no changes at this time.      Hortencia Pilar, MD  06/09/2017 12:37 PM

## 2017-06-11 ENCOUNTER — Ambulatory Visit (INDEPENDENT_AMBULATORY_CARE_PROVIDER_SITE_OTHER): Payer: Medicare Other | Admitting: Orthopaedic Surgery

## 2017-06-12 ENCOUNTER — Encounter (INDEPENDENT_AMBULATORY_CARE_PROVIDER_SITE_OTHER): Payer: Self-pay | Admitting: Orthopaedic Surgery

## 2017-06-12 ENCOUNTER — Ambulatory Visit (INDEPENDENT_AMBULATORY_CARE_PROVIDER_SITE_OTHER): Payer: Medicare Other | Admitting: Orthopaedic Surgery

## 2017-06-12 VITALS — Ht 64.0 in | Wt 216.6 lb

## 2017-06-12 DIAGNOSIS — M25532 Pain in left wrist: Secondary | ICD-10-CM | POA: Diagnosis not present

## 2017-06-12 MED ORDER — BUPIVACAINE HCL 0.25 % IJ SOLN
0.3300 mL | INTRAMUSCULAR | Status: AC | PRN
  Administered 2017-06-12: .33 mL

## 2017-06-12 MED ORDER — METHYLPREDNISOLONE ACETATE 40 MG/ML IJ SUSP
13.3300 mg | INTRAMUSCULAR | Status: AC | PRN
  Administered 2017-06-12: 13.33 mg

## 2017-06-12 MED ORDER — LIDOCAINE HCL 1 % IJ SOLN
3.0000 mL | INTRAMUSCULAR | Status: AC | PRN
  Administered 2017-06-12: 3 mL

## 2017-06-12 NOTE — Progress Notes (Signed)
Office Visit Note   Patient: Jacqueline Lucas           Date of Birth: [birth year]           MRN: [id number] Visit Date: 06/12/2017              Requested by: [name], [address] [place], [place] [address] PCP: [name], MD   Assessment & Plan: Visit Diagnoses:  1. Pain in left wrist     Plan: Impression is left de Quervain's tenosynovitis.  Today, we injected the first dorsal compartment with cortisone.  I have recommended relative rest for the next 1-2 weeks.  She will follow-up with [place] on as-needed basis.  Call with concerns or questions in the meantime.  Follow-Up Instructions: Return if symptoms worsen or fail to improve.   Orders:  Orders Placed This Encounter  Procedures  . Hand/UE Inj: L extensor compartment 1   No orders of the defined types were placed in this encounter.     Procedures: Hand/UE Inj: L extensor compartment 1 for de Quervain's tenosynovitis on 06/12/2017 4:29 PM Medications: 3 mL lidocaine 1 %; 0.33 mL bupivacaine 0.25 %; 13.33 mg methylPREDNISolone acetate 40 MG/ML      Clinical Data: No additional findings.   Subjective: Chief Complaint  Patient presents with  . Left Forearm - Pain    HPI patient is a pleasant 67 year old female who presents to our clinic today with left wrist pain.  This is been ongoing for the past year and is progressively worsened.  All of her pain is to the first dorsal compartment.  Worse with any movement of the wrist.  She does get some relief with ice.  She also notes occasional swelling to the area.  No numbness tingling burning.  Of note, she is status post left [facility] arthroplasty.  Her right thumb is starting to become fairly painful so she has noticed that she is favoring the left hand.  Review of Systems as detailed in HPI.  All others reviewed and are negative.   Objective: Vital Signs: Ht 5\' 4"  (1.626 m)   Wt 216 lb 9.6 oz (98.2 kg)   BMI 37.18 kg/m   Physical Exam  well-developed well-nourished female in no acute distress.  Alert and oriented x3.  Ortho Exam examination of the left hand reveals moderate tenderness and swelling to the first dorsal compartment.  Positive Finkelstein.  No pain over the [facility].  She is neurovascularly intact distally.  Specialty Comments:  No specialty comments available.  Imaging: No new imaging today.   PMFS History: Patient Active Problem List   Diagnosis Date Noted  . Pain in left wrist 06/12/2017  . Chronic venous insufficiency 06/09/2017  . Varicose veins of both lower extremities with pain 04/30/2017  . Claudication ([place]) 04/30/2017  . Arthritis of carpometacarpal [facility]) joint of left thumb 05/12/2016  . Bilateral thumb pain 05/01/2016  . Chronic bronchitis ([place]) 04/14/2016  . Anemia 02/03/2016  . Status post bilateral total hip replacement 01/10/2016  . Sicca ([place]) 01/10/2016  . Age related osteoporosis 01/10/2016  . Hip arthritis 08/16/2015  . Microscopic hematuria 07/30/2015  . Vaginal atrophy 07/30/2015  . GAD (generalized anxiety disorder) 07/08/2015  . Moderate episode of recurrent major depressive disorder ([place]) 07/08/2015  . Migraine with aura and without status migrainosus, not intractable 07/08/2015  . Dyslipidemia 07/08/2015  . Degenerative arthritis of right knee 03/16/2015  . Atrophic vaginitis 01/31/2015  . Recurrent UTI 12/31/2014  .  IBS (irritable bowel syndrome) 10/29/2014  . Asthma, moderate persistent 09/08/2014  . Allergic state 09/07/2014  . Colon polyp 09/07/2014  . Hemorrhoid 09/07/2014  . Constrictive tenosynovitis 03/12/2014  . H/O total knee replacement, bilateral 07/31/2013  . CAD (coronary artery disease) 11/28/2012  . Anxiety 11/28/2012  . Acid reflux 11/28/2012  . Cardiac murmur 11/28/2012  . BP (high blood pressure) 11/28/2012  . Cannot sleep 11/28/2012  . Adiposity 11/28/2012  . Arthritis, degenerative 11/28/2012  . Restless leg 11/28/2012  . Supraventricular  tachycardia ([place]) 11/28/2012  . Fibromyalgia 11/28/2012   Past Medical History:  Diagnosis Date  . Allergy   . Allergy to adhesive tape    Allergy to paper tape as well  . Alzheimer disease   . Anemia   . Anginal pain ([place])   . Anxiety   . Arthritis   . Asthma   . Bronchitis   . Cataract   . Chronic nausea   . Claustrophobia   . DDD (degenerative disc disease), lumbar   . Depression   . Dry eyes    uses Restasis  . Fibromyalgia   . GERD (gastroesophageal reflux disease)   . H/O bladder infections   . Heart murmur   . Hematuria   . Hyperlipidemia   . Hypertension   . Hypothyroidism   . IBS (irritable bowel syndrome)   . Insomnia   . Lumbar neuritis   . Migraines   . Obesity   . Osteoporosis   . Palpitation   . PONV (postoperative nausea and vomiting)   . RLS (restless legs syndrome)   . Shoulder fracture, right   . Sicca ([place])   . Tachycardia   . Thyroid disease     Family History  Problem Relation Age of Onset  . Diabetes Mother   . Hypertension Mother   . Heart disease Mother   . Diabetes Father   . Cancer Father   . Hypertension Sister   . Depression Sister   . Cancer Brother   . Heart disease Brother   . Stroke Son   . Fibromyalgia Daughter   . Bladder Cancer Neg Hx   . Kidney disease Neg Hx   . Kidney cancer Neg Hx     Past Surgical History:  Procedure Laterality Date  . ABDOMINAL HYSTERECTOMY     due to cancer of ovaries  . BACK SURGERY N/A 2014   x 5  . CARDIAC CATHETERIZATION     no PCI; 12/18/12 ([facility], Dr. Marcello Moores): EP study with ablation. No coronary angiography done then.  . COLONOSCOPY W/ POLYPECTOMY    . COLONOSCOPY WITH PROPOFOL N/A 02/04/2016   Procedure: COLONOSCOPY WITH PROPOFOL;  Surgeon: Robert Bellow, MD;  Location: [facility] ENDOSCOPY;  Service: Endoscopy;  Laterality: N/A;  . ESOPHAGOGASTRODUODENOSCOPY (EGD) WITH PROPOFOL N/A 02/04/2016   Procedure: ESOPHAGOGASTRODUODENOSCOPY (EGD) WITH PROPOFOL;  Surgeon: Robert Bellow,  MD;  Location: [facility] ENDOSCOPY;  Service: Endoscopy;  Laterality: N/A;  . FINGER ARTHROSCOPY WITH CARPOMETACARPEL ([place]) ARTHROPLASTY Left 05/01/2016   Dr. Erlinda Hong  . GIVENS CAPSULE STUDY N/A 05/02/2016   Procedure: GIVENS CAPSULE STUDY;  Surgeon: Manya Silvas, MD;  Location: [facility] ENDOSCOPY;  Service: Endoscopy;  Laterality: N/A;  . HAND SURGERY    . HEMORRHOID SURGERY    . HIP ARTHROPLASTY    . JOINT REPLACEMENT Bilateral    knee  . KNEE ARTHROPLASTY    . REPLACEMENT TOTAL KNEE Left   . SPINE SURGERY     x 2  .  tendonititis     right elbow, right wrist  . TOTAL HIP ARTHROPLASTY Left 2013  . TOTAL HIP ARTHROPLASTY Right 08/16/2015   Procedure: TOTAL HIP ARTHROPLASTY ANTERIOR APPROACH;  Surgeon: Meredith Pel, MD;  Location: [place];  Service: Orthopedics;  Laterality: Right;  . TOTAL KNEE ARTHROPLASTY Right 03/16/2015   Procedure: RIGHT TOTAL KNEE ARTHROPLASTY, PCL SACRIFICING;  Surgeon: Meredith Pel, MD;  Location: [place];  Service: Orthopedics;  Laterality: Right;   Social History   Occupational History  . Not on file  Tobacco Use  . Smoking status: Never Smoker  . Smokeless tobacco: Never Used  Substance and Sexual Activity  . Alcohol use: No    Alcohol/week: 0.0 oz  . Drug use: No  . Sexual activity: Never

## 2017-06-19 ENCOUNTER — Ambulatory Visit: Payer: Medicare Other | Attending: Rheumatology

## 2017-06-19 DIAGNOSIS — M546 Pain in thoracic spine: Secondary | ICD-10-CM | POA: Diagnosis not present

## 2017-06-19 DIAGNOSIS — M6281 Muscle weakness (generalized): Secondary | ICD-10-CM | POA: Diagnosis not present

## 2017-06-19 DIAGNOSIS — R293 Abnormal posture: Secondary | ICD-10-CM | POA: Insufficient documentation

## 2017-06-19 DIAGNOSIS — G8929 Other chronic pain: Secondary | ICD-10-CM

## 2017-06-19 DIAGNOSIS — M62830 Muscle spasm of back: Secondary | ICD-10-CM | POA: Diagnosis not present

## 2017-06-19 NOTE — Patient Instructions (Signed)
Gentle flexion neck and upper back x 10-30 sec x 2 reps 4-5x/day, sit with arm support ,  Breath deeply into lower lungs x 2-3 reps.   4-5x/day
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
## 2017-06-27 DIAGNOSIS — L905 Scar conditions and fibrosis of skin: Secondary | ICD-10-CM | POA: Diagnosis not present

## 2017-06-27 DIAGNOSIS — L98421 Non-pressure chronic ulcer of back limited to breakdown of skin: Secondary | ICD-10-CM | POA: Diagnosis not present

## 2017-06-28 ENCOUNTER — Ambulatory Visit: Payer: Medicare Other | Attending: Rheumatology | Admitting: Physical Therapy

## 2017-06-28 ENCOUNTER — Telehealth: Payer: Self-pay | Admitting: Physical Therapy

## 2017-06-28 DIAGNOSIS — M6281 Muscle weakness (generalized): Secondary | ICD-10-CM | POA: Insufficient documentation

## 2017-06-28 DIAGNOSIS — R293 Abnormal posture: Secondary | ICD-10-CM | POA: Insufficient documentation

## 2017-06-28 DIAGNOSIS — M62830 Muscle spasm of back: Secondary | ICD-10-CM | POA: Insufficient documentation

## 2017-06-28 DIAGNOSIS — G8929 Other chronic pain: Secondary | ICD-10-CM | POA: Insufficient documentation

## 2017-06-28 DIAGNOSIS — M546 Pain in thoracic spine: Secondary | ICD-10-CM | POA: Insufficient documentation

## 2017-06-28 NOTE — Telephone Encounter (Signed)
Left message regarding No-show to appointment today. Asked patient to call if she needed to change future appointment or to cancel. Left reminder of next appointment time.

## 2017-07-02 ENCOUNTER — Ambulatory Visit: Payer: Medicare Other

## 2017-07-03 ENCOUNTER — Other Ambulatory Visit: Payer: Self-pay | Admitting: Family Medicine

## 2017-07-03 ENCOUNTER — Ambulatory Visit: Payer: Medicare Other

## 2017-07-03 DIAGNOSIS — F3160 Bipolar disorder, current episode mixed, unspecified: Secondary | ICD-10-CM

## 2017-07-03 MED ORDER — DIVALPROEX SODIUM 250 MG PO DR TAB: 250.0000 mg | DELAYED_RELEASE_TABLET | Freq: Two times a day (BID) | ORAL | 1 refills | Status: DC

## 2017-07-03 NOTE — Telephone Encounter (Signed)
Copied from [place] [phone]. Topic: Quick Communication - Rx Refill/Question >> Jul 03, 2017  2:35 PM Lennox Solders wrote: Medication:divalproex 250 mg #180 Has the patient contacted their pharmacy? No Preferred Pharmacy [facility] mail order

## 2017-07-09 ENCOUNTER — Telehealth: Payer: Self-pay | Admitting: Rheumatology

## 2017-07-09 MED ORDER — GABAPENTIN 300 MG PO CAPS: 900.0000 mg | ORAL_CAPSULE | Freq: Three times a day (TID) | ORAL | 0 refills | Status: DC

## 2017-07-09 NOTE — Telephone Encounter (Signed)
Last Visit: 05/15/17 Next Visit: 11/20/17  Okay to refill per Dr. Deveshwar 

## 2017-07-09 NOTE — Telephone Encounter (Signed)
Patient's husband Fritz Pickerel called requesting prescription refill of Gabapentin.  Patient's pharmacy is [facility].

## 2017-07-10 ENCOUNTER — Ambulatory Visit: Payer: Medicare Other | Admitting: Physical Therapy

## 2017-07-10 ENCOUNTER — Encounter: Payer: Self-pay | Admitting: Physical Therapy

## 2017-07-10 DIAGNOSIS — M62830 Muscle spasm of back: Secondary | ICD-10-CM | POA: Diagnosis not present

## 2017-07-10 DIAGNOSIS — G8929 Other chronic pain: Secondary | ICD-10-CM | POA: Diagnosis not present

## 2017-07-10 DIAGNOSIS — M546 Pain in thoracic spine: Principal | ICD-10-CM

## 2017-07-10 DIAGNOSIS — M6281 Muscle weakness (generalized): Secondary | ICD-10-CM | POA: Diagnosis not present

## 2017-07-10 DIAGNOSIS — R293 Abnormal posture: Secondary | ICD-10-CM

## 2017-07-10 NOTE — Therapy (Signed)
[place], [place], [address] Phone: [phone]   Fax:  [phone]  Physical Therapy Treatment  Patient Details  Name: Jacqueline Lucas MRN: [id number] Date of Birth: [birth year] Referring Provider: Bo Merino  MD   Encounter Date: 07/10/2017  PT End of Session - 07/10/17 1632    Visit Number  2    Number of Visits  12    Date for PT Re-Evaluation  07/27/17    PT Start Time  0424    PT Stop Time  0455    PT Time Calculation (min)  31 min       Past Medical History:  Diagnosis Date  . Allergy   . Allergy to adhesive tape    Allergy to paper tape as well  . Alzheimer disease   . Anemia   . Anginal pain ([place])   . Anxiety   . Arthritis   . Asthma   . Bronchitis   . Cataract   . Chronic nausea   . Claustrophobia   . DDD (degenerative disc disease), lumbar   . Depression   . Dry eyes    uses Restasis  . Fibromyalgia   . GERD (gastroesophageal reflux disease)   . H/O bladder infections   . Heart murmur   . Hematuria   . Hyperlipidemia   . Hypertension   . Hypothyroidism   . IBS (irritable bowel syndrome)   . Insomnia   . Lumbar neuritis   . Migraines   . Obesity   . Osteoporosis   . Palpitation   . PONV (postoperative nausea and vomiting)   . RLS (restless legs syndrome)   . Shoulder fracture, right   . Sicca ([place])   . Tachycardia   . Thyroid disease     Past Surgical History:  Procedure Laterality Date  . ABDOMINAL HYSTERECTOMY     due to cancer of ovaries  . BACK SURGERY N/A 2014   x 5  . CARDIAC CATHETERIZATION     no PCI; 12/18/12 ([facility], Dr. Marcello Moores): EP study with ablation. No coronary angiography done then.  . COLONOSCOPY W/ POLYPECTOMY    . COLONOSCOPY WITH PROPOFOL N/A 02/04/2016   Procedure: COLONOSCOPY WITH PROPOFOL;  Surgeon: Robert Bellow, MD;  Location: [facility] ENDOSCOPY;  Service: Endoscopy;  Laterality: N/A;  . ESOPHAGOGASTRODUODENOSCOPY (EGD) WITH  PROPOFOL N/A 02/04/2016   Procedure: ESOPHAGOGASTRODUODENOSCOPY (EGD) WITH PROPOFOL;  Surgeon: Robert Bellow, MD;  Location: [facility] ENDOSCOPY;  Service: Endoscopy;  Laterality: N/A;  . FINGER ARTHROSCOPY WITH CARPOMETACARPEL ([place]) ARTHROPLASTY Left 05/01/2016   Dr. Erlinda Hong  . GIVENS CAPSULE STUDY N/A 05/02/2016   Procedure: GIVENS CAPSULE STUDY;  Surgeon: Manya Silvas, MD;  Location: [facility] ENDOSCOPY;  Service: Endoscopy;  Laterality: N/A;  . HAND SURGERY    . HEMORRHOID SURGERY    . HIP ARTHROPLASTY    . JOINT REPLACEMENT Bilateral    knee  . KNEE ARTHROPLASTY    . REPLACEMENT TOTAL KNEE Left   . SPINE SURGERY     x 2  . tendonititis     right elbow, right wrist  . TOTAL HIP ARTHROPLASTY Left 2013  . TOTAL HIP ARTHROPLASTY Right 08/16/2015   Procedure: TOTAL HIP ARTHROPLASTY ANTERIOR APPROACH;  Surgeon: Meredith Pel, MD;  Location: [place];  Service: Orthopedics;  Laterality: Right;  . TOTAL KNEE ARTHROPLASTY Right 03/16/2015   Procedure: RIGHT TOTAL KNEE ARTHROPLASTY, PCL SACRIFICING;  Surgeon: Meredith Pel, MD;  Location: [facility]  OR;  Service: Orthopedics;  Laterality: Right;    There were no vitals filed for this visit.  Subjective Assessment - 07/10/17 1625    Subjective  back is feeling better. 5/10, not as intense.     Currently in Pain?  Yes    Pain Score  5     Pain Location  Back    Pain Orientation  Right;Left;Upper    Pain Descriptors / Indicators  Shooting deep pain    Aggravating Factors   moving arms, someone patting me on the back, hugging    Pain Relieving Factors  heat codeine                        [facility] Adult PT Treatment/Exercise - 07/10/17 0001      Neck Exercises: Seated   Other Seated Exercise  scapular retraction, shoulder rolls       Manual Therapy   Manual therapy comments  Gentle IASTM along upper traps, pericap -light pressure       Neck Exercises: Stretches   Upper Trapezius Stretch  3 reps;10 seconds    Levator Stretch  3  reps;10 seconds    Other Neck Stretches  flexion stretch per HEP     Other Neck Stretches  seated upper trunk rotations x 3 each side              PT Education - 07/10/17 1700    Education provided  Yes    Education Details  HEP    Person(s) Educated  Patient    Methods  Explanation;Handout    Comprehension  Verbalized understanding       PT Short Term Goals - 06/19/17 1332      PT SHORT TERM GOAL #1   Title  he will be independent with iniitial HEP    Time  2    Period  Weeks    Status  New      PT SHORT TERM GOAL #2   Title  She will demo understnaing of good posture     Time  2    Period  Weeks    Status  New      PT SHORT TERM GOAL #3   Title  She will report pain decr 30% or more in upper back    Time  3    Period  Weeks    Status  New        PT Long Term Goals - 06/19/17 1415      PT LONG TERM GOAL #1   Title  She will be indpendent with all hE{P issued    Time  6    Period  Weeks    Status  New      PT LONG TERM GOAL #2   Title  She will report pain in upper back as intermittant    Time  6    Period  Weeks    Status  New      PT LONG TERM GOAL #3   Title  She will report able to do activity as prior to onset of upper back pain    Time  6    Period  Weeks    Status  New      PT LONG TERM GOAL #4   Title  FOTO score will decr to  39% or less limited or less to demo improvement in function    Time  6    Period  Weeks    Status  New      PT LONG TERM GOAL #5   Title  She will be able to tolerate moderate pressure to upper back with max 3/10 pain    Time  6    Period  Weeks    Status  New            Plan - 07/10/17 1701    Clinical Impression Statement  Pt arrives after 3 weeks since initial evaluation. She reports intensity of pain has decreased. She has been compiant with HEP. We progressed with gentle scapular motions and neck stretches which she tolerated all without increased pain. She reports hypersensitivity to touch along  trapezius. Began gentle IASTM to this area. At end of treatment pt reports the area felt better.     PT Next Visit Plan  STW  , heat, stretching , add to/ review HEp.      PT Home Exercise Plan  gentle stretching neck flexion, upper trap stretch, upper trunk  rotation stretch sitting, scapular squeezes     Consulted and Agree with Plan of Care  Patient       Patient will benefit from skilled therapeutic intervention in order to improve the following deficits and impairments:  Pain, Postural dysfunction, Decreased strength, Decreased range of motion, Increased muscle spasms, Decreased activity tolerance  Visit Diagnosis: Chronic bilateral thoracic back pain  Abnormal posture  Muscle weakness (generalized)  Muscle spasm of back     Problem List Patient Active Problem List   Diagnosis Date Noted  . Pain in left wrist 06/12/2017  . Chronic venous insufficiency 06/09/2017  . Varicose veins of both lower extremities with pain 04/30/2017  . Claudication ([place]) 04/30/2017  . Arthritis of carpometacarpal [facility]) joint of left thumb 05/12/2016  . Bilateral thumb pain 05/01/2016  . Chronic bronchitis ([place]) 04/14/2016  . Anemia 02/03/2016  . Status post bilateral total hip replacement 01/10/2016  . Sicca ([place]) 01/10/2016  . Age related osteoporosis 01/10/2016  . Hip arthritis 08/16/2015  . Microscopic hematuria 07/30/2015  . Vaginal atrophy 07/30/2015  . GAD (generalized anxiety disorder) 07/08/2015  . Moderate episode of recurrent major depressive disorder ([place]) 07/08/2015  . Migraine with aura and without status migrainosus, not intractable 07/08/2015  . Dyslipidemia 07/08/2015  . Degenerative arthritis of right knee 03/16/2015  . Atrophic vaginitis 01/31/2015  . Recurrent UTI 12/31/2014  . IBS (irritable bowel syndrome) 10/29/2014  . Asthma, moderate persistent 09/08/2014  . Allergic state 09/07/2014  . Colon polyp 09/07/2014  . Hemorrhoid 09/07/2014  . Constrictive  tenosynovitis 03/12/2014  . H/O total knee replacement, bilateral 07/31/2013  . CAD (coronary artery disease) 11/28/2012  . Anxiety 11/28/2012  . Acid reflux 11/28/2012  . Cardiac murmur 11/28/2012  . BP (high blood pressure) 11/28/2012  . Cannot sleep 11/28/2012  . Adiposity 11/28/2012  . Arthritis, degenerative 11/28/2012  . Restless leg 11/28/2012  . Supraventricular tachycardia ([place]) 11/28/2012  . Fibromyalgia 11/28/2012    Dorene Ar, PTA 07/10/2017, 5:12 PM  [place], [place], [address] Phone: [phone]   Fax:  [phone]  Name: JODECI RINI MRN: [id number] Date of Birth: Jun 13, 1950

## 2017-07-11 ENCOUNTER — Other Ambulatory Visit: Payer: Self-pay | Admitting: Family Medicine

## 2017-07-11 DIAGNOSIS — G43109 Migraine with aura, not intractable, without status migrainosus: Secondary | ICD-10-CM

## 2017-07-11 DIAGNOSIS — I10 Essential (primary) hypertension: Secondary | ICD-10-CM

## 2017-07-12 ENCOUNTER — Other Ambulatory Visit (INDEPENDENT_AMBULATORY_CARE_PROVIDER_SITE_OTHER): Payer: Medicare Other | Admitting: Vascular Surgery

## 2017-07-13 ENCOUNTER — Encounter: Payer: Self-pay | Admitting: Physical Therapy

## 2017-07-13 ENCOUNTER — Ambulatory Visit: Payer: Medicare Other | Admitting: Physical Therapy

## 2017-07-13 ENCOUNTER — Encounter: Payer: Self-pay | Admitting: Family Medicine

## 2017-07-13 DIAGNOSIS — M6281 Muscle weakness (generalized): Secondary | ICD-10-CM

## 2017-07-13 DIAGNOSIS — G8929 Other chronic pain: Secondary | ICD-10-CM

## 2017-07-13 DIAGNOSIS — R293 Abnormal posture: Secondary | ICD-10-CM | POA: Diagnosis not present

## 2017-07-13 DIAGNOSIS — M546 Pain in thoracic spine: Secondary | ICD-10-CM | POA: Diagnosis not present

## 2017-07-13 DIAGNOSIS — M62830 Muscle spasm of back: Secondary | ICD-10-CM | POA: Diagnosis not present

## 2017-07-13 NOTE — Therapy (Signed)
[place], [place], [address] Phone: [phone]   Fax:  [phone]  Physical Therapy Treatment  Patient Details  Name: Jacqueline Lucas MRN: [id number] Date of Birth: 04-Sep-1950 Referring Provider: Bo Merino  MD   Encounter Date: 07/13/2017  PT End of Session - 07/13/17 1134    Visit Number  3    Number of Visits  12    Date for PT Re-Evaluation  07/27/17    PT Start Time  5956    PT Stop Time  1155    PT Time Calculation (min)  24 min       Past Medical History:  Diagnosis Date  . Allergy   . Allergy to adhesive tape    Allergy to paper tape as well  . Alzheimer disease   . Anemia   . Anginal pain ([place])   . Anxiety   . Arthritis   . Asthma   . Bronchitis   . Cataract   . Chronic nausea   . Claustrophobia   . DDD (degenerative disc disease), lumbar   . Depression   . Dry eyes    uses Restasis  . Fibromyalgia   . GERD (gastroesophageal reflux disease)   . H/O bladder infections   . Heart murmur   . Hematuria   . Hyperlipidemia   . Hypertension   . Hypothyroidism   . IBS (irritable bowel syndrome)   . Insomnia   . Lumbar neuritis   . Migraines   . Obesity   . Osteoporosis   . Palpitation   . PONV (postoperative nausea and vomiting)   . RLS (restless legs syndrome)   . Shoulder fracture, right   . Sicca ([place])   . Tachycardia   . Thyroid disease     Past Surgical History:  Procedure Laterality Date  . ABDOMINAL HYSTERECTOMY     due to cancer of ovaries  . BACK SURGERY N/A 2014   x 5  . CARDIAC CATHETERIZATION     no PCI; 12/18/12 ([facility], Dr. Marcello Moores): EP study with ablation. No coronary angiography done then.  . COLONOSCOPY W/ POLYPECTOMY    . COLONOSCOPY WITH PROPOFOL N/A 02/04/2016   Procedure: COLONOSCOPY WITH PROPOFOL;  Surgeon: Robert Bellow, MD;  Location: [facility] ENDOSCOPY;  Service: Endoscopy;  Laterality: N/A;  . ESOPHAGOGASTRODUODENOSCOPY (EGD) WITH  PROPOFOL N/A 02/04/2016   Procedure: ESOPHAGOGASTRODUODENOSCOPY (EGD) WITH PROPOFOL;  Surgeon: Robert Bellow, MD;  Location: [facility] ENDOSCOPY;  Service: Endoscopy;  Laterality: N/A;  . FINGER ARTHROSCOPY WITH CARPOMETACARPEL ([place]) ARTHROPLASTY Left 05/01/2016   Dr. Erlinda Hong  . GIVENS CAPSULE STUDY N/A 05/02/2016   Procedure: GIVENS CAPSULE STUDY;  Surgeon: Manya Silvas, MD;  [facility] ENDOSCOPY;  Service: Endoscopy;  Laterality: N/A;  . HAND SURGERY    . HEMORRHOID SURGERY    . HIP ARTHROPLASTY    . JOINT REPLACEMENT Bilateral    knee  . KNEE ARTHROPLASTY    . REPLACEMENT TOTAL KNEE Left   . SPINE SURGERY     x 2  . tendonititis     right elbow, right wrist  . TOTAL HIP ARTHROPLASTY Left 2013  . TOTAL HIP ARTHROPLASTY Right 08/16/2015   Procedure: TOTAL HIP ARTHROPLASTY ANTERIOR APPROACH;  Surgeon: Meredith Pel, MD;  Location: [place];  Service: Orthopedics;  Laterality: Right;  . TOTAL KNEE ARTHROPLASTY Right 03/16/2015   Procedure: RIGHT TOTAL KNEE ARTHROPLASTY, PCL SACRIFICING;  Surgeon: Meredith Pel, MD;  Location: [facility]  OR;  Service: Orthopedics;  Laterality: Right;    There were no vitals filed for this visit.  Subjective Assessment - 07/13/17 1134    Subjective  No pain in the back today. Just a little pain at top of shoulders.     Currently in Pain?  Yes    Pain Score  1     Pain Location  -- upper traps     Pain Orientation  Right;Left    Pain Descriptors / Indicators  Aching                       [facility] Adult PT Treatment/Exercise - 07/13/17 0001      Self-Care   Self-Care  Other Self-Care Comments    Other Self-Care Comments   Self massage/trigger point release with tennis ball       Neck Exercises: Seated   Other Seated Exercise  scapular retraction, shoulder rolls       Manual Therapy   Manual therapy comments  Gentle IASTM along upper traps, pericap -mod pressure       Neck Exercises: Stretches   Upper Trapezius Stretch  3 reps;10  seconds    Levator Stretch  3 reps;10 seconds    Other Neck Stretches  flexion stretch per HEP     Other Neck Stretches  seated upper trunk rotations x 3 each side                PT Short Term Goals - 07/13/17 1142      PT SHORT TERM GOAL #1   Title  he will be independent with iniitial HEP    Baseline  min cues     Time  2    Period  Weeks    Status  On-going      PT SHORT TERM GOAL #2   Title  She will demo understnaing of good posture     Time  2    Period  Weeks    Status  Achieved      PT SHORT TERM GOAL #3   Title  She will report pain decr 30% or more in upper back    Time  3    Period  Weeks    Status  Achieved        PT Long Term Goals - 06/19/17 1415      PT LONG TERM GOAL #1   Title  She will be indpendent with all hE{P issued    Time  6    Period  Weeks    Status  New      PT LONG TERM GOAL #2   Title  She will report pain in upper back as intermittant    Time  6    Period  Weeks    Status  New      PT LONG TERM GOAL #3   Title  She will report able to do activity as prior to onset of upper back pain    Time  6    Period  Weeks    Status  New      PT LONG TERM GOAL #4   Title  FOTO score will decr to  39% or less limited or less to demo improvement in function    Time  6    Period  Weeks    Status  New      PT LONG TERM GOAL #5   Title  She will be able  to tolerate moderate pressure to upper back with max 3/10 pain    Time  6    Period  Weeks    Status  New            Plan - 07/13/17 1136    Clinical Impression Statement  Pt reports decreased pain at least 30%.  Mild pain at upper traps she attributes to bra straps. He tolerates increased pressure to upper back and trap today with IASTM. Her husband is massaging her with a tennis ball and it seems to be helping. Educated her on trigger point release technique and use of tennis ball on wall. Added gentle thoracic extensions from chair and reviewed all HEP. SHe required min cues  for scap squeeze. Using good posture in clinic without cues. STG# 2, #3 met. She will likely be ready for discharge soon.     PT Next Visit Plan  STW  , heat, stretching , add to/ review HEp.  FOTO    PT Home Exercise Plan  gentle stretching neck flexion, upper trap stretch, upper trunk  rotation stretch sitting, scapular squeezes     Consulted and Agree with Plan of Care  Patient       Patient will benefit from skilled therapeutic intervention in order to improve the following deficits and impairments:  Pain, Postural dysfunction, Decreased strength, Decreased range of motion, Increased muscle spasms, Decreased activity tolerance  Visit Diagnosis: Chronic bilateral thoracic back pain  Abnormal posture  Muscle weakness (generalized)  Muscle spasm of back     Problem List Patient Active Problem List   Diagnosis Date Noted  . Pain in left wrist 06/12/2017  . Chronic venous insufficiency 06/09/2017  . Varicose veins of both lower extremities with pain 04/30/2017  . Claudication ([place]) 04/30/2017  . Arthritis of carpometacarpal [facility]) joint of left thumb 05/12/2016  . Bilateral thumb pain 05/01/2016  . Chronic bronchitis ([place]) 04/14/2016  . Anemia 02/03/2016  . Status post bilateral total hip replacement 01/10/2016  . Sicca ([place]) 01/10/2016  . Age related osteoporosis 01/10/2016  . Hip arthritis 08/16/2015  . Microscopic hematuria 07/30/2015  . Vaginal atrophy 07/30/2015  . GAD (generalized anxiety disorder) 07/08/2015  . Moderate episode of recurrent major depressive disorder ([place]) 07/08/2015  . Migraine with aura and without status migrainosus, not intractable 07/08/2015  . Dyslipidemia 07/08/2015  . Degenerative arthritis of right knee 03/16/2015  . Atrophic vaginitis 01/31/2015  . Recurrent UTI 12/31/2014  . IBS (irritable bowel syndrome) 10/29/2014  . Asthma, moderate persistent 09/08/2014  . Allergic state 09/07/2014  . Colon polyp 09/07/2014  . Hemorrhoid 09/07/2014   . Constrictive tenosynovitis 03/12/2014  . H/O total knee replacement, bilateral 07/31/2013  . CAD (coronary artery disease) 11/28/2012  . Anxiety 11/28/2012  . Acid reflux 11/28/2012  . Cardiac murmur 11/28/2012  . BP (high blood pressure) 11/28/2012  . Cannot sleep 11/28/2012  . Adiposity 11/28/2012  . Arthritis, degenerative 11/28/2012  . Restless leg 11/28/2012  . Supraventricular tachycardia ([place]) 11/28/2012  . Fibromyalgia 11/28/2012    Dorene Ar, PTA 07/13/2017, 12:13 PM  [place], [place], [address] Phone: [phone]   Fax:  [phone]  Name: Jacqueline Lucas MRN: [id number] Date of Birth: 07/14/1950

## 2017-07-16 ENCOUNTER — Encounter (INDEPENDENT_AMBULATORY_CARE_PROVIDER_SITE_OTHER): Payer: Medicare Other

## 2017-07-16 ENCOUNTER — Telehealth (INDEPENDENT_AMBULATORY_CARE_PROVIDER_SITE_OTHER): Payer: Self-pay | Admitting: Vascular Surgery

## 2017-07-16 NOTE — Telephone Encounter (Signed)
Please call the patient and notify her that she will have to come in to pick up a physical script for compression stockings. I will have Kim or Dr. Delana Meyer write the script out. Thanks.

## 2017-07-17 ENCOUNTER — Ambulatory Visit: Payer: Medicare Other

## 2017-07-17 DIAGNOSIS — M546 Pain in thoracic spine: Principal | ICD-10-CM

## 2017-07-17 DIAGNOSIS — G8929 Other chronic pain: Secondary | ICD-10-CM | POA: Diagnosis not present

## 2017-07-17 DIAGNOSIS — M62830 Muscle spasm of back: Secondary | ICD-10-CM

## 2017-07-17 DIAGNOSIS — M6281 Muscle weakness (generalized): Secondary | ICD-10-CM | POA: Diagnosis not present

## 2017-07-17 DIAGNOSIS — R293 Abnormal posture: Secondary | ICD-10-CM

## 2017-07-17 NOTE — Therapy (Addendum)
[place], [place], [address] Phone: [phone]   Fax:  [phone]  Physical Therapy Treatment/Discharge  Patient Details  Name: Jacqueline Lucas MRN: [id number] Date of Birth: 07-Jun-1950 Referring Provider: Bo Merino  MD   Encounter Date: 07/17/2017  PT End of Session - 07/17/17 1608    Visit Number  4    Number of Visits  12    Date for PT Re-Evaluation  07/27/17    PT Start Time  0405    PT Stop Time  0428    PT Time Calculation (min)  23 min    Activity Tolerance  Patient tolerated treatment well    Behavior During Therapy  [facility] for tasks assessed/performed       Past Medical History:  Diagnosis Date  . Allergy   . Allergy to adhesive tape    Allergy to paper tape as well  . Alzheimer disease   . Anemia   . Anginal pain ([place])   . Anxiety   . Arthritis   . Asthma   . Bronchitis   . Cataract   . Chronic nausea   . Claustrophobia   . DDD (degenerative disc disease), lumbar   . Depression   . Dry eyes    uses Restasis  . Fibromyalgia   . GERD (gastroesophageal reflux disease)   . H/O bladder infections   . Heart murmur   . Hematuria   . Hyperlipidemia   . Hypertension   . Hypothyroidism   . IBS (irritable bowel syndrome)   . Insomnia   . Lumbar neuritis   . Migraines   . Obesity   . Osteoporosis   . Palpitation   . PONV (postoperative nausea and vomiting)   . RLS (restless legs syndrome)   . Shoulder fracture, right   . Sicca ([place])   . Tachycardia   . Thyroid disease     Past Surgical History:  Procedure Laterality Date  . ABDOMINAL HYSTERECTOMY     due to cancer of ovaries  . BACK SURGERY N/A 2014   x 5  . CARDIAC CATHETERIZATION     no PCI; 12/18/12 ([facility], Dr. Marcello Moores): EP study with ablation. No coronary angiography done then.  . COLONOSCOPY W/ POLYPECTOMY    . COLONOSCOPY WITH PROPOFOL N/A 02/04/2016   Procedure: COLONOSCOPY WITH PROPOFOL;  Surgeon:  Robert Bellow, MD;  Location: [facility] ENDOSCOPY;  Service: Endoscopy;  Laterality: N/A;  . ESOPHAGOGASTRODUODENOSCOPY (EGD) WITH PROPOFOL N/A 02/04/2016   Procedure: ESOPHAGOGASTRODUODENOSCOPY (EGD) WITH PROPOFOL;  Surgeon: Robert Bellow, MD;  Location: [facility] ENDOSCOPY;  Service: Endoscopy;  Laterality: N/A;  . FINGER ARTHROSCOPY WITH CARPOMETACARPEL ([place]) ARTHROPLASTY Left 05/01/2016   Dr. Erlinda Hong  . GIVENS CAPSULE STUDY N/A 05/02/2016   Procedure: GIVENS CAPSULE STUDY;  Surgeon: Manya Silvas, MD;  Location: [facility] ENDOSCOPY;  Service: Endoscopy;  Laterality: N/A;  . HAND SURGERY    . HEMORRHOID SURGERY    . HIP ARTHROPLASTY    . JOINT REPLACEMENT Bilateral    knee  . KNEE ARTHROPLASTY    . REPLACEMENT TOTAL KNEE Left   . SPINE SURGERY     x 2  . tendonititis     right elbow, right wrist  . TOTAL HIP ARTHROPLASTY Left 2013  . TOTAL HIP ARTHROPLASTY Right 08/16/2015   Procedure: TOTAL HIP ARTHROPLASTY ANTERIOR APPROACH;  Surgeon: Meredith Pel, MD;  Location: [place];  Service: Orthopedics;  Laterality: Right;  . TOTAL KNEE  ARTHROPLASTY Right 03/16/2015   Procedure: RIGHT TOTAL KNEE ARTHROPLASTY, PCL SACRIFICING;  Surgeon: Meredith Pel, MD;  Location: [place];  Service: Orthopedics;  Laterality: Right;    There were no vitals filed for this visit.  Subjective Assessment - 07/17/17 1608    Subjective  Doing pretty good. Back not hurting. Very mild upper back pain . Ball and home activity and support of arms at home have eased pain in shoulders also. I don't feel need to come more as manageing pain with husband at home    Pain Score  1     Pain Location  Back    Pain Orientation  Upper    Pain Descriptors / Indicators  Aching    Pain Type  Chronic pain    Pain Onset  More than a month ago    Pain Frequency  Intermittent         [facility] PT Assessment - 07/17/17 0001      Palpation   Palpation comment  tender to touch but less than eval though I did not press firmly as she is  still tneder to touch            Self care: We discussed progress , HEP and her modifications an/or stopping ones that caused pain. She is suing good posture and support to arms to ease pain . She and her husband are doing STW that is easing pain  , Discussed activity in pool and how to progress activity level and when to stop ( with  incr pain).  She is managing pain well with activity suggested by PT.                  PT Short Term Goals - 07/17/17 1630      PT SHORT TERM GOAL #1   Title  he will be independent with iniitial HEP    Status  Achieved      PT SHORT TERM GOAL #2   Title  She will demo understnaing of good posture     Status  Achieved      PT SHORT TERM GOAL #3   Title  She will report pain decr 30% or more in upper back    Status  Achieved        PT Long Term Goals - 07/17/17 1630      PT LONG TERM GOAL #1   Title  She will be indpendent with all HEP issued    Baseline  she is doing initial HEP as able  and does not want to flare pain with new exercises    Status  Deferred      PT LONG TERM GOAL #2   Title  She will report pain in upper back as intermittant    Baseline  occasional no pain, mostly constant    Status  Partially Met      PT LONG TERM GOAL #3   Title  She will report able to do activity as prior to onset of upper back pain    Baseline  improving but not 100%    Status  Not Met      PT LONG TERM GOAL #4   Title  FOTO score will decr to  39% or less limited or less to demo improvement in function    Baseline  faile to get FOTO    Status  Unable to assess      PT LONG TERM GOAL #5   Title  She will be able to tolerate moderate pressure to upper back with max 3/10 pain    Baseline  this is improved but still very sensitive    Status  Partially Met            Plan - 07/17/17 1608    Clinical Impression Statement  Pain improved and she feels she can manage at home . She is limited due to other orthopedic issues  so  exercies are not appropriate  as they tend to flare up shoulders and back.    PT Treatment/Interventions  Electrical Stimulation;Moist Heat;Ultrasound;Patient/family education;Therapeutic exercise;Passive range of motion;Taping;Dry needling;Manual techniques    PT Next Visit Plan  she agreed to discharge  as she is better and is not interested in exercise fearful of flare ups.     PT Home Exercise Plan  gentle stretching neck flexion, upper trap stretch, upper trunk  rotation stretch sitting, scapular squeezes     Consulted and Agree with Plan of Care  Patient       Patient will benefit from skilled therapeutic intervention in order to improve the following deficits and impairments:  Pain, Postural dysfunction, Decreased strength, Decreased range of motion, Increased muscle spasms, Decreased activity tolerance  Visit Diagnosis: Chronic bilateral thoracic back pain  Abnormal posture  Muscle weakness (generalized)  Muscle spasm of back     Problem List Patient Active Problem List   Diagnosis Date Noted  . Pain in left wrist 06/12/2017  . Chronic venous insufficiency 06/09/2017  . Varicose veins of both lower extremities with pain 04/30/2017  . Claudication ([place]) 04/30/2017  . Arthritis of carpometacarpal [facility]) joint of left thumb 05/12/2016  . Bilateral thumb pain 05/01/2016  . Chronic bronchitis ([place]) 04/14/2016  . Anemia 02/03/2016  . Status post bilateral total hip replacement 01/10/2016  . Sicca ([place]) 01/10/2016  . Age related osteoporosis 01/10/2016  . Hip arthritis 08/16/2015  . Microscopic hematuria 07/30/2015  . Vaginal atrophy 07/30/2015  . GAD (generalized anxiety disorder) 07/08/2015  . Moderate episode of recurrent major depressive disorder ([place]) 07/08/2015  . Migraine with aura and without status migrainosus, not intractable 07/08/2015  . Dyslipidemia 07/08/2015  . Degenerative arthritis of right knee 03/16/2015  . Atrophic vaginitis 01/31/2015  . Recurrent UTI  12/31/2014  . IBS (irritable bowel syndrome) 10/29/2014  . Asthma, moderate persistent 09/08/2014  . Allergic state 09/07/2014  . Colon polyp 09/07/2014  . Hemorrhoid 09/07/2014  . Constrictive tenosynovitis 03/12/2014  . H/O total knee replacement, bilateral 07/31/2013  . CAD (coronary artery disease) 11/28/2012  . Anxiety 11/28/2012  . Acid reflux 11/28/2012  . Cardiac murmur 11/28/2012  . BP (high blood pressure) 11/28/2012  . Cannot sleep 11/28/2012  . Adiposity 11/28/2012  . Arthritis, degenerative 11/28/2012  . Restless leg 11/28/2012  . Supraventricular tachycardia ([place]) 11/28/2012  . Fibromyalgia 11/28/2012    Darrel Hoover  PT 07/17/2017, 4:33 PM  [facility] [address] [place], [place], [address] Phone: [phone]   Fax:  [phone]  Name: Jacqueline Lucas MRN: [id number] Date of Birth: 04-29-1950  PHYSICAL THERAPY DISCHARGE SUMMARY  Visits from Start of Care: 4  Current functional level related to goals / functional outcomes: She is improved and managing self care    Remaining deficits: See above   Education / Equipment: HEP Plan: Patient agrees to discharge.  Patient goals were partially met. Patient is being discharged due to being pleased with the current functional level.  ?????

## 2017-07-18 DIAGNOSIS — I10 Essential (primary) hypertension: Secondary | ICD-10-CM | POA: Diagnosis not present

## 2017-07-18 DIAGNOSIS — M48062 Spinal stenosis, lumbar region with neurogenic claudication: Secondary | ICD-10-CM | POA: Diagnosis not present

## 2017-07-18 DIAGNOSIS — Z6836 Body mass index (BMI) 36.0-36.9, adult: Secondary | ICD-10-CM | POA: Diagnosis not present

## 2017-07-18 DIAGNOSIS — Z7189 Other specified counseling: Secondary | ICD-10-CM | POA: Diagnosis not present

## 2017-07-19 ENCOUNTER — Ambulatory Visit: Payer: Medicare Other

## 2017-07-24 ENCOUNTER — Ambulatory Visit: Payer: Medicare Other | Admitting: Physical Therapy

## 2017-07-26 ENCOUNTER — Ambulatory Visit: Payer: Medicare Other

## 2017-07-26 DIAGNOSIS — M48062 Spinal stenosis, lumbar region with neurogenic claudication: Secondary | ICD-10-CM | POA: Diagnosis not present

## 2017-07-27 DIAGNOSIS — H2513 Age-related nuclear cataract, bilateral: Secondary | ICD-10-CM | POA: Diagnosis not present

## 2017-07-31 ENCOUNTER — Telehealth (INDEPENDENT_AMBULATORY_CARE_PROVIDER_SITE_OTHER): Payer: Self-pay | Admitting: Vascular Surgery

## 2017-07-31 NOTE — Telephone Encounter (Signed)
Please call the patient's husband back to let him know that the prescription is ready for pick-up and that it was up front as promised by the nurse and that you overlooked it, thanks. The patient needs the prescription before her laser on Friday.

## 2017-08-01 ENCOUNTER — Encounter (INDEPENDENT_AMBULATORY_CARE_PROVIDER_SITE_OTHER): Payer: Self-pay | Admitting: Orthopedic Surgery

## 2017-08-01 ENCOUNTER — Ambulatory Visit (INDEPENDENT_AMBULATORY_CARE_PROVIDER_SITE_OTHER): Payer: Medicare Other | Admitting: Orthopedic Surgery

## 2017-08-01 ENCOUNTER — Ambulatory Visit (INDEPENDENT_AMBULATORY_CARE_PROVIDER_SITE_OTHER): Payer: Medicare Other

## 2017-08-01 DIAGNOSIS — M79604 Pain in right leg: Secondary | ICD-10-CM

## 2017-08-01 DIAGNOSIS — I209 Angina pectoris, unspecified: Secondary | ICD-10-CM

## 2017-08-02 ENCOUNTER — Encounter (INDEPENDENT_AMBULATORY_CARE_PROVIDER_SITE_OTHER): Payer: Self-pay | Admitting: Vascular Surgery

## 2017-08-02 ENCOUNTER — Ambulatory Visit (INDEPENDENT_AMBULATORY_CARE_PROVIDER_SITE_OTHER): Payer: Medicare Other | Admitting: Vascular Surgery

## 2017-08-02 ENCOUNTER — Other Ambulatory Visit (INDEPENDENT_AMBULATORY_CARE_PROVIDER_SITE_OTHER): Payer: Medicare Other | Admitting: Vascular Surgery

## 2017-08-02 DIAGNOSIS — I83811 Varicose veins of right lower extremities with pain: Secondary | ICD-10-CM | POA: Diagnosis not present

## 2017-08-02 DIAGNOSIS — I83813 Varicose veins of bilateral lower extremities with pain: Secondary | ICD-10-CM

## 2017-08-02 NOTE — Progress Notes (Signed)
    MRN : [id number]  Jacqueline Lucas is a 67 y.o. (1950-11-19) female who presents with chief complaint of  Chief Complaint  Patient presents with  . Follow-up    Right Great Saphenous Laser ablation  .    The patient's right lower extremity was sterilely prepped and draped.  The ultrasound machine was used to visualize the right great saphenous vein throughout its course.  A segment above the knee was selected for access.  The saphenous vein was accessed without difficulty using ultrasound guidance with a micropuncture needle.   An 0.018  wire was placed beyond the saphenofemoral junction through the sheath and the microneedle was removed.  The 65 cm sheath was then placed over the wire and the wire and dilator were removed.  The laser fiber was placed through the sheath and its tip was placed approximately 2 cm below the saphenofemoral junction.  Tumescent anesthesia was then created with a dilute lidocaine solution.  Laser energy was then delivered with constant withdrawal of the sheath and laser fiber.  Approximately 582 Joules of energy were delivered over a length of 14 cm.  Sterile dressings were placed.  The patient tolerated the procedure well without complications.

## 2017-08-05 ENCOUNTER — Encounter (INDEPENDENT_AMBULATORY_CARE_PROVIDER_SITE_OTHER): Payer: Self-pay | Admitting: Orthopedic Surgery

## 2017-08-05 NOTE — Progress Notes (Signed)
Office Visit Note   Patient: Jacqueline Lucas           Date of Birth: 05/08/1950           MRN: [id number] Visit Date: 08/01/2017 Requested by: [name], [address] [place], [place] [address] PCP: [name], MD  Subjective: Chief Complaint  Patient presents with  . Right Hip - Pain  . Lower Back - Pain    HPI: Jacqueline Lucas is a patient with right hip pain.  She reports pain with bending.  She also describes some occasional groin pain.  She has a history of spinal stenosis and underwent fusion surgery years ago.  Describes pain radiating down below the knee into the right leg.  She had total hip replacement about a year ago.  Taking oxycodone for pain.  Standing from squatting does give her pain.  Walking hurts her back.  She states that her leg does go to sleep.  She has to sit down after squatting.  She does keep her grandchildren.              ROS: All systems reviewed are negative as they relate to the chief complaint within the history of present illness.  Patient denies  fevers or chills.   Assessment & Plan: Visit Diagnoses:  1. Pain in right leg     Plan: Impression is right back and hip pain.  Plan is radiographs look reasonable on the right hip.  She does have a broken screw in her fusion construct indicating possible delayed or nonunion.  I think she should consider following up with her back surgeon to further investigate that.  I think the hip looks pretty reasonable at this time but it would be good to check her back in a year.  Follow-Up Instructions: Return if symptoms worsen or fail to improve.   Orders:  Orders Placed This Encounter  Procedures  . XR HIP UNILAT W OR W/O PELVIS 2-3 VIEWS RIGHT  . XR Lumbar Spine 2-3 Views   No orders of the defined types were placed in this encounter.     Procedures: No procedures performed   Clinical Data: No additional findings.  Objective: Vital Signs: There were no vitals taken for this  visit.  hysical Exam:   Constitutional: Patient appears well-developed HEENT:  Head: Normocephalic Eyes:EOM are normal Neck: Normal range of motion Cardiovascular: Normal rate Pulmonary/chest: Effort normal Neurologic: Patient is alert Skin: Skin is warm Psychiatric: Patient has normal mood and affect    Ortho Exam: Ortho exam demonstrates no groin pain with internal/external rotation of the leg.  Leg lengths equal.  Mildly positive nerve root tension signs on the right compared to the left.  Good ankle dorsi flexion plantarflexion quad hamstring strength.  Hip flexor strength also 5+ out of 5 bilaterally.  Back incision looks intact.  Some pain with forward and lateral bending but no trochanteric tenderness is noted.  Specialty Comments:  No specialty comments available.  Imaging: No results found.   PMFS History: Patient Active Problem List   Diagnosis Date Noted  . Pain in left wrist 06/12/2017  . Chronic venous insufficiency 06/09/2017  . Varicose veins of both lower extremities with pain 04/30/2017  . Claudication ([place]) 04/30/2017  . Arthritis of carpometacarpal [facility]) joint of left thumb 05/12/2016  . Bilateral thumb pain 05/01/2016  . Chronic bronchitis ([place]) 04/14/2016  . Anemia 02/03/2016  . Status post bilateral total hip replacement 01/10/2016  . Sicca ([place])  01/10/2016  . Age related osteoporosis 01/10/2016  . Hip arthritis 08/16/2015  . Microscopic hematuria 07/30/2015  . Vaginal atrophy 07/30/2015  . GAD (generalized anxiety disorder) 07/08/2015  . Moderate episode of recurrent major depressive disorder ([place]) 07/08/2015  . Migraine with aura and without status migrainosus, not intractable 07/08/2015  . Dyslipidemia 07/08/2015  . Degenerative arthritis of right knee 03/16/2015  . Atrophic vaginitis 01/31/2015  . Recurrent UTI 12/31/2014  . IBS (irritable bowel syndrome) 10/29/2014  . Asthma, moderate persistent 09/08/2014  . Allergic state 09/07/2014  .  Colon polyp 09/07/2014  . Hemorrhoid 09/07/2014  . Constrictive tenosynovitis 03/12/2014  . H/O total knee replacement, bilateral 07/31/2013  . CAD (coronary artery disease) 11/28/2012  . Anxiety 11/28/2012  . Acid reflux 11/28/2012  . Cardiac murmur 11/28/2012  . BP (high blood pressure) 11/28/2012  . Cannot sleep 11/28/2012  . Adiposity 11/28/2012  . Arthritis, degenerative 11/28/2012  . Restless leg 11/28/2012  . Supraventricular tachycardia ([place]) 11/28/2012  . Fibromyalgia 11/28/2012   Past Medical History:  Diagnosis Date  . Allergy   . Allergy to adhesive tape    Allergy to paper tape as well  . Alzheimer disease   . Anemia   . Anginal pain ([place])   . Anxiety   . Arthritis   . Asthma   . Bronchitis   . Cataract   . Chronic nausea   . Claustrophobia   . DDD (degenerative disc disease), lumbar   . Depression   . Dry eyes    uses Restasis  . Fibromyalgia   . GERD (gastroesophageal reflux disease)   . H/O bladder infections   . Heart murmur   . Hematuria   . Hyperlipidemia   . Hypertension   . Hypothyroidism   . IBS (irritable bowel syndrome)   . Insomnia   . Lumbar neuritis   . Migraines   . Obesity   . Osteoporosis   . Palpitation   . PONV (postoperative nausea and vomiting)   . RLS (restless legs syndrome)   . Shoulder fracture, right   . Sicca ([place])   . Tachycardia   . Thyroid disease     Family History  Problem Relation Age of Onset  . Diabetes Mother   . Hypertension Mother   . Heart disease Mother   . Diabetes Father   . Cancer Father   . Hypertension Sister   . Depression Sister   . Cancer Brother   . Heart disease Brother   . Stroke Son   . Fibromyalgia Daughter   . Bladder Cancer Neg Hx   . Kidney disease Neg Hx   . Kidney cancer Neg Hx     Past Surgical History:  Procedure Laterality Date  . ABDOMINAL HYSTERECTOMY     due to cancer of ovaries  . BACK SURGERY N/A 2014   x 5  . CARDIAC CATHETERIZATION     no PCI; 12/18/12  ([facility], Dr. Marcello Moores): EP study with ablation. No coronary angiography done then.  . COLONOSCOPY W/ POLYPECTOMY    . COLONOSCOPY WITH PROPOFOL N/A 02/04/2016   Procedure: COLONOSCOPY WITH PROPOFOL;  Surgeon: Robert Bellow, MD;  Location: [facility] ENDOSCOPY;  Service: Endoscopy;  Laterality: N/A;  . ESOPHAGOGASTRODUODENOSCOPY (EGD) WITH PROPOFOL N/A 02/04/2016   Procedure: ESOPHAGOGASTRODUODENOSCOPY (EGD) WITH PROPOFOL;  Surgeon: Robert Bellow, MD;  Location: [facility] ENDOSCOPY;  Service: Endoscopy;  Laterality: N/A;  . FINGER ARTHROSCOPY WITH CARPOMETACARPEL ([place]) ARTHROPLASTY Left 05/01/2016   Dr. Erlinda Hong  . GIVENS CAPSULE  STUDY N/A 05/02/2016   Procedure: GIVENS CAPSULE STUDY;  Surgeon: Manya Silvas, MD;  Location: [facility] ENDOSCOPY;  Service: Endoscopy;  Laterality: N/A;  . HAND SURGERY    . HEMORRHOID SURGERY    . HIP ARTHROPLASTY    . JOINT REPLACEMENT Bilateral    knee  . KNEE ARTHROPLASTY    . REPLACEMENT TOTAL KNEE Left   . SPINE SURGERY     x 2  . tendonititis     right elbow, right wrist  . TOTAL HIP ARTHROPLASTY Left 2013  . TOTAL HIP ARTHROPLASTY Right 08/16/2015   Procedure: TOTAL HIP ARTHROPLASTY ANTERIOR APPROACH;  Surgeon: Meredith Pel, MD;  Location: [place];  Service: Orthopedics;  Laterality: Right;  . TOTAL KNEE ARTHROPLASTY Right 03/16/2015   Procedure: RIGHT TOTAL KNEE ARTHROPLASTY, PCL SACRIFICING;  Surgeon: Meredith Pel, MD;  Location: [place];  Service: Orthopedics;  Laterality: Right;   Social History   Occupational History  . Not on file  Tobacco Use  . Smoking status: Never Smoker  . Smokeless tobacco: Never Used  Substance and Sexual Activity  . Alcohol use: No    Alcohol/week: 0.0 oz  . Drug use: No  . Sexual activity: Never

## 2017-08-06 ENCOUNTER — Encounter (INDEPENDENT_AMBULATORY_CARE_PROVIDER_SITE_OTHER): Payer: Medicare Other

## 2017-08-06 ENCOUNTER — Ambulatory Visit (INDEPENDENT_AMBULATORY_CARE_PROVIDER_SITE_OTHER): Payer: Medicare Other | Admitting: Vascular Surgery

## 2017-08-06 ENCOUNTER — Encounter (INDEPENDENT_AMBULATORY_CARE_PROVIDER_SITE_OTHER): Payer: Self-pay | Admitting: Vascular Surgery

## 2017-08-06 DIAGNOSIS — I83812 Varicose veins of left lower extremities with pain: Secondary | ICD-10-CM | POA: Diagnosis not present

## 2017-08-06 DIAGNOSIS — I83813 Varicose veins of bilateral lower extremities with pain: Secondary | ICD-10-CM

## 2017-08-06 NOTE — Progress Notes (Signed)
    MRN : [id number]  Jacqueline Lucas is a 67 y.o. (1950/09/17) female who presents with chief complaint of  Chief Complaint  Patient presents with  . Follow-up    Left Leg GSV laser ablation  .    The patient's left lower extremity was sterilely prepped and draped.  The ultrasound machine was used to visualize the left great  saphenous vein throughout its course.  A segment below the knee was selected for access.  The saphenous vein was accessed without difficulty using ultrasound guidance with a micropuncture needle.   An 0.018  wire was placed beyond the saphenofemoral junction through the sheath and the microneedle was removed.  The 65 cm sheath was then placed over the wire and the wire and dilator were removed.  The laser fiber was placed through the sheath and its tip was placed approximately 2 cm below the saphenofemoral junction.  Tumescent anesthesia was then created with a dilute lidocaine solution.  Laser energy was then delivered with constant withdrawal of the sheath and laser fiber.  Approximately 1372 Joules of energy were delivered over a length of 41 cm.  Sterile dressings were placed.  The patient tolerated the procedure well without complications.

## 2017-08-07 ENCOUNTER — Encounter (INDEPENDENT_AMBULATORY_CARE_PROVIDER_SITE_OTHER): Payer: Medicare Other

## 2017-08-08 ENCOUNTER — Other Ambulatory Visit (INDEPENDENT_AMBULATORY_CARE_PROVIDER_SITE_OTHER): Payer: Self-pay | Admitting: Vascular Surgery

## 2017-08-08 ENCOUNTER — Telehealth (INDEPENDENT_AMBULATORY_CARE_PROVIDER_SITE_OTHER): Payer: Self-pay | Admitting: Orthopedic Surgery

## 2017-08-08 DIAGNOSIS — I83813 Varicose veins of bilateral lower extremities with pain: Secondary | ICD-10-CM

## 2017-08-08 NOTE — Telephone Encounter (Signed)
Patient aware CD is ready for pickup at front desk.  

## 2017-08-08 NOTE — Telephone Encounter (Signed)
Patient called needing a copy of her X-ray taken on 08/01/17. Patient will pick up X-ray when ready. I advised patient there is a $5.00 charge. The number to contact patient is [phone]

## 2017-08-09 ENCOUNTER — Encounter (INDEPENDENT_AMBULATORY_CARE_PROVIDER_SITE_OTHER): Payer: Medicare Other

## 2017-08-10 ENCOUNTER — Telehealth: Payer: Self-pay

## 2017-08-10 ENCOUNTER — Ambulatory Visit (INDEPENDENT_AMBULATORY_CARE_PROVIDER_SITE_OTHER): Payer: Medicare Other

## 2017-08-10 DIAGNOSIS — I83813 Varicose veins of bilateral lower extremities with pain: Secondary | ICD-10-CM

## 2017-08-10 NOTE — Telephone Encounter (Signed)
Scheduled for AWV w/ [facility] 08/17/17

## 2017-08-11 ENCOUNTER — Other Ambulatory Visit: Payer: Self-pay | Admitting: Family Medicine

## 2017-08-11 DIAGNOSIS — I10 Essential (primary) hypertension: Secondary | ICD-10-CM

## 2017-08-13 ENCOUNTER — Encounter: Payer: Self-pay | Admitting: Family Medicine

## 2017-08-13 ENCOUNTER — Ambulatory Visit (INDEPENDENT_AMBULATORY_CARE_PROVIDER_SITE_OTHER): Payer: Medicare Other | Admitting: Family Medicine

## 2017-08-13 VITALS — BP 120/60 | HR 78 | Resp 16 | Ht 64.0 in | Wt 224.3 lb

## 2017-08-13 DIAGNOSIS — Z90710 Acquired absence of both cervix and uterus: Secondary | ICD-10-CM | POA: Insufficient documentation

## 2017-08-13 DIAGNOSIS — F330 Major depressive disorder, recurrent, mild: Secondary | ICD-10-CM

## 2017-08-13 DIAGNOSIS — K219 Gastro-esophageal reflux disease without esophagitis: Secondary | ICD-10-CM

## 2017-08-13 DIAGNOSIS — F411 Generalized anxiety disorder: Secondary | ICD-10-CM

## 2017-08-13 DIAGNOSIS — M797 Fibromyalgia: Secondary | ICD-10-CM | POA: Diagnosis not present

## 2017-08-13 DIAGNOSIS — E039 Hypothyroidism, unspecified: Secondary | ICD-10-CM

## 2017-08-13 DIAGNOSIS — J449 Chronic obstructive pulmonary disease, unspecified: Secondary | ICD-10-CM | POA: Diagnosis not present

## 2017-08-13 DIAGNOSIS — J4489 Other specified chronic obstructive pulmonary disease: Secondary | ICD-10-CM

## 2017-08-13 DIAGNOSIS — M8580 Other specified disorders of bone density and structure, unspecified site: Secondary | ICD-10-CM

## 2017-08-13 DIAGNOSIS — I209 Angina pectoris, unspecified: Secondary | ICD-10-CM

## 2017-08-13 DIAGNOSIS — I10 Essential (primary) hypertension: Secondary | ICD-10-CM

## 2017-08-13 DIAGNOSIS — E781 Pure hyperglyceridemia: Secondary | ICD-10-CM

## 2017-08-13 DIAGNOSIS — I471 Supraventricular tachycardia, unspecified: Secondary | ICD-10-CM

## 2017-08-13 DIAGNOSIS — G8929 Other chronic pain: Secondary | ICD-10-CM | POA: Diagnosis not present

## 2017-08-13 DIAGNOSIS — Z1231 Encounter for screening mammogram for malignant neoplasm of breast: Secondary | ICD-10-CM

## 2017-08-13 DIAGNOSIS — G43109 Migraine with aura, not intractable, without status migrainosus: Secondary | ICD-10-CM | POA: Diagnosis not present

## 2017-08-13 DIAGNOSIS — Z780 Asymptomatic menopausal state: Secondary | ICD-10-CM

## 2017-08-13 DIAGNOSIS — M810 Age-related osteoporosis without current pathological fracture: Secondary | ICD-10-CM

## 2017-08-13 DIAGNOSIS — L98429 Non-pressure chronic ulcer of back with unspecified severity: Secondary | ICD-10-CM | POA: Diagnosis not present

## 2017-08-13 DIAGNOSIS — R739 Hyperglycemia, unspecified: Secondary | ICD-10-CM

## 2017-08-13 DIAGNOSIS — F3160 Bipolar disorder, current episode mixed, unspecified: Secondary | ICD-10-CM | POA: Diagnosis not present

## 2017-08-13 DIAGNOSIS — J302 Other seasonal allergic rhinitis: Secondary | ICD-10-CM

## 2017-08-13 DIAGNOSIS — R7303 Prediabetes: Secondary | ICD-10-CM | POA: Diagnosis not present

## 2017-08-13 MED ORDER — MONTELUKAST SODIUM 10 MG PO TABS: 10.0000 mg | ORAL_TABLET | Freq: Every day | ORAL | 1 refills | Status: DC

## 2017-08-13 MED ORDER — VENLAFAXINE HCL ER 150 MG PO CP24
150.0000 mg | ORAL_CAPSULE | Freq: Every day | ORAL | 1 refills | Status: DC
Start: 2017-08-13 — End: 2017-10-08

## 2017-08-13 MED ORDER — OLMESARTAN MEDOXOMIL-HCTZ 40-12.5 MG PO TABS: 1.0000 | ORAL_TABLET | Freq: Every day | ORAL | 1 refills | Status: DC

## 2017-08-13 NOTE — Progress Notes (Signed)
Name: Jacqueline Lucas   MRN: [id number]    DOB: 1950-06-16   Date:08/13/2017       Progress Note  Subjective  Chief Complaint  Chief Complaint  Patient presents with  . Anxiety  . Migraine  . Asthma  . Fibromyalgia    HPI   HTN: she is takingBenicar - 40-12.5, mteoprolol,and is doing well, she has episodes of chest pain - sometimes feels like muscular pain, no palpitation. She has been gradually gaining weight and we will continue to monitor for now   Bipolar disorder: she is doing well on Depakote and Effexor, denies mania, sleeping better - 6-8 hours per night.Husband thinks she is doing well. She has not been seen by psychiatrist in a long time. Unchanged   Migraine headaches: used to go to the headache clinic, but states she was taking too many medications. Migraineswereunder control however got worseand we referred her to neurologist at [facility].Migraines are now occurring twice a week and lasting 1-2 hours an episodes.She is taking Gabapentin,off oxycodone . She also has episodes of cluster headaches intermittent, but not as frequent, episodes about once a week and not lasting as long   FMS: seeing Dr. Olegario Messier on muscle relaxer and gabapentin. She states symptoms worse with weather changes.  She still aches all over pain 4/10, unchanged   Dyslipidemia: currently on lipitor only, off Tricor but triglycerides very high, she is on lovaza at this time, we will monitor lipids.  Goiter: having dysphagia, and is contemplating surgery, she was seen by Dr. Ronnald Collum, also seen by Dr. Richardson Landry but he did not recommend surgery at this time  COPD mild: from second hand smoking, taking Duleraand singulair.She has occasional cough and wheezing, she has noticed some SOB when humidity is high.   Back Pain: she is seeing Ortho Dr. Marlou Sa and has been having procedures done. Right wrist, also has one screw from previous surgery that is broken and seen by Dr. Retia Passe  at [facility] - who did her surgeries before . She will go back if needed. Pain is lumbar spine can be intense when moving, but zero/10 pain while sitting in our office resting.   Angina/ History  of SVT: doing well, on metoprolol, statin, and aspirin, sees Dr. Clayborn Bigness yearly.  Denies recent episodes of chest pain   Obesity & Hyperglycemia: shehas hyperglycemia./insuilin resistance, She has some metformin at home but caused diarrhea and she stopped it.   Patient Active Problem List   Diagnosis Date Noted  . Pain in left wrist 06/12/2017  . Chronic venous insufficiency 06/09/2017  . Varicose veins of both lower extremities with pain 04/30/2017  . Claudication ([place]) 04/30/2017  . Arthritis of carpometacarpal [facility]) joint of left thumb 05/12/2016  . Bilateral thumb pain 05/01/2016  . Chronic bronchitis ([place]) 04/14/2016  . Anemia 02/03/2016  . Status post bilateral total hip replacement 01/10/2016  . Sicca ([place]) 01/10/2016  . Age related osteoporosis 01/10/2016  . Hip arthritis 08/16/2015  . Microscopic hematuria 07/30/2015  . Vaginal atrophy 07/30/2015  . GAD (generalized anxiety disorder) 07/08/2015  . Moderate episode of recurrent major depressive disorder ([place]) 07/08/2015  . Migraine with aura and without status migrainosus, not intractable 07/08/2015  . Dyslipidemia 07/08/2015  . Degenerative arthritis of right knee 03/16/2015  . Atrophic vaginitis 01/31/2015  . Recurrent UTI 12/31/2014  . IBS (irritable bowel syndrome) 10/29/2014  . Asthma, moderate persistent 09/08/2014  . Allergic state 09/07/2014  . Colon polyp 09/07/2014  . Hemorrhoid  09/07/2014  . Constrictive tenosynovitis 03/12/2014  . H/O total knee replacement, bilateral 07/31/2013  . CAD (coronary artery disease) 11/28/2012  . Anxiety 11/28/2012  . Acid reflux 11/28/2012  . Cardiac murmur 11/28/2012  . BP (high blood pressure) 11/28/2012  . Cannot sleep 11/28/2012  . Adiposity 11/28/2012   . Arthritis, degenerative 11/28/2012  . Restless leg 11/28/2012  . Supraventricular tachycardia ([place]) 11/28/2012  . Fibromyalgia 11/28/2012    Past Surgical History:  Procedure Laterality Date  . ABDOMINAL HYSTERECTOMY     due to cancer of ovaries  . BACK SURGERY N/A 2014   x 5  . CARDIAC CATHETERIZATION     no PCI; 12/18/12 ([facility], Dr. Marcello Moores): EP study with ablation. No coronary angiography done then.  . COLONOSCOPY W/ POLYPECTOMY    . COLONOSCOPY WITH PROPOFOL N/A 02/04/2016   Procedure: COLONOSCOPY WITH PROPOFOL;  Surgeon: Robert Bellow, MD;  Location: [facility] ENDOSCOPY;  Service: Endoscopy;  Laterality: N/A;  . ESOPHAGOGASTRODUODENOSCOPY (EGD) WITH PROPOFOL N/A 02/04/2016   Procedure: ESOPHAGOGASTRODUODENOSCOPY (EGD) WITH PROPOFOL;  Surgeon: Robert Bellow, MD;  Location: [facility] ENDOSCOPY;  Service: Endoscopy;  Laterality: N/A;  . FINGER ARTHROSCOPY WITH CARPOMETACARPEL ([place]) ARTHROPLASTY Left 05/01/2016   Dr. Erlinda Hong  . GIVENS CAPSULE STUDY N/A 05/02/2016   Procedure: GIVENS CAPSULE STUDY;  Surgeon: Manya Silvas, MD;  Location: [facility] ENDOSCOPY;  Service: Endoscopy;  Laterality: N/A;  . HAND SURGERY    . HEMORRHOID SURGERY    . HIP ARTHROPLASTY    . JOINT REPLACEMENT Bilateral    knee  . KNEE ARTHROPLASTY    . REPLACEMENT TOTAL KNEE Left   . SPINE SURGERY     x 2  . tendonititis     right elbow, right wrist  . TOTAL HIP ARTHROPLASTY Left 2013  . TOTAL HIP ARTHROPLASTY Right 08/16/2015   Procedure: TOTAL HIP ARTHROPLASTY ANTERIOR APPROACH;  Surgeon: Meredith Pel, MD;  Location: [place];  Service: Orthopedics;  Laterality: Right;  . TOTAL KNEE ARTHROPLASTY Right 03/16/2015   Procedure: RIGHT TOTAL KNEE ARTHROPLASTY, PCL SACRIFICING;  Surgeon: Meredith Pel, MD;  Location: [place];  Service: Orthopedics;  Laterality: Right;    Family History  Problem Relation Age of Onset  . Diabetes Mother   . Hypertension Mother   . Heart disease Mother   . Diabetes Father   .  Cancer Father   . Hypertension Sister   . Depression Sister   . Cancer Brother   . Heart disease Brother   . Stroke Son   . Fibromyalgia Daughter   . Bladder Cancer Neg Hx   . Kidney disease Neg Hx   . Kidney cancer Neg Hx     Social History   Socioeconomic History  . Marital status: Married    Spouse name: Not on file  . Number of children: Not on file  . Years of education: Not on file  . Highest education level: Not on file  Occupational History  . Not on file  Social Needs  . Financial resource strain: Not on file  . Food insecurity:    Worry: Not on file    Inability: Not on file  . Transportation needs:    Medical: Not on file    Non-medical: Not on file  Tobacco Use  . Smoking status: Never Smoker  . Smokeless tobacco: Never Used  Substance and Sexual Activity  . Alcohol use: No    Alcohol/week: 0.0 oz  . Drug use: No  . Sexual activity:  Never  Lifestyle  . Physical activity:    Days per week: Not on file    Minutes per session: Not on file  . Stress: Not on file  Relationships  . Social connections:    Talks on phone: Not on file    Gets together: Not on file    Attends religious service: Not on file    Active member of club or organization: Not on file    Attends meetings of clubs or organizations: Not on file    Relationship status: Not on file  . Intimate partner violence:    Fear of current or ex partner: Not on file    Emotionally abused: Not on file    Physically abused: Not on file    Forced sexual activity: Not on file  Other Topics Concern  . Not on file  Social History Narrative  . Not on file     Current Outpatient Medications:  .  aspirin 81 MG chewable tablet, Chew 81 mg by mouth daily., Disp: , Rfl:  .  atorvastatin (LIPITOR) 40 MG tablet, Take 1 tablet (40 mg total) by mouth at bedtime., Disp: 90 tablet, Rfl: 1 .  baclofen (LIORESAL) 10 MG tablet, TAKE ONE TABLET AT 7AM AND ONE TABLET AT 2PM AS NEEDED FOR MUSCLE SPASM, Disp: 180  tablet, Rfl: 1 .  Cholecalciferol (D3-1000) 1000 units tablet, Take 1,000 Units by mouth at bedtime. , Disp: , Rfl:  .  cycloSPORINE (RESTASIS) 0.05 % ophthalmic emulsion, Place 1 drop into both eyes 2 (two) times daily. , Disp: , Rfl:  .  divalproex (DEPAKOTE) 250 MG DR tablet, Take 1 tablet (250 mg total) by mouth 2 (two) times daily., Disp: 180 tablet, Rfl: 1 .  estradiol (ESTRACE VAGINAL) 0.1 MG/GM vaginal cream, Apply 0.5mg  (pea-sized amount)  just inside the vaginal introitus with a finger-tip every night for two weeks and then Monday, Wednesday and Friday nights., Disp: 30 g, Rfl: 12 .  gabapentin (NEURONTIN) 300 MG capsule, Take 3 capsules (900 mg total) by mouth 3 (three) times daily., Disp: 810 capsule, Rfl: 0 .  ibuprofen (ADVIL,MOTRIN) 600 MG tablet, 600 mg 2 (two) times daily. , Disp: , Rfl:  .  lansoprazole (PREVACID) 30 MG capsule, Take 1 capsule (30 mg total) daily by mouth., Disp: 90 capsule, Rfl: 1 .  levalbuterol (XOPENEX) 1.25 MG/3ML nebulizer solution, Take 1.25 mg by nebulization every 4 (four) hours as needed for wheezing., Disp: 72 mL, Rfl: 4 .  levothyroxine (SYNTHROID) 88 MCG tablet, Take 88 mcg by mouth daily before breakfast. , Disp: , Rfl:  .  magnesium oxide (MAG-OX) 400 MG tablet, Take 400 mg by mouth at bedtime. Reported on 07/28/2015, Disp: , Rfl:  .  metoprolol tartrate (LOPRESSOR) 25 MG tablet, TAKE 1 TABLET BY MOUTH TWICE A DAY, Disp: 180 tablet, Rfl: 1 .  mometasone-formoterol (DULERA) 200-5 MCG/ACT AERO, Inhale 2 puffs into the lungs 2 (two) times daily. Reported on 07/28/2015, Disp: 3 Inhaler, Rfl: 0 .  montelukast (SINGULAIR) 10 MG tablet, Take 1 tablet (10 mg total) by mouth at bedtime., Disp: 90 tablet, Rfl: 1 .  mupirocin ointment (BACTROBAN) 2 %, APPLY ON THE SKIN TWICE A DAY X 1 WEEK, Disp: , Rfl: 0 .  olmesartan-hydrochlorothiazide (BENICAR HCT) 40-12.5 MG tablet, Take 1 tablet by mouth daily., Disp: 90 tablet, Rfl: 1 .  omega-3 acid ethyl esters (LOVAZA) 1  g capsule, Take 2 capsules (2 g total) by mouth daily., Disp: 360 capsule, Rfl: 1 .  ondansetron (ZOFRAN) 4 MG tablet, Take 1 tablet (4 mg total) by mouth every 8 (eight) hours as needed for nausea or vomiting., Disp: 20 tablet, Rfl: 0 .  Polyethyl Glycol-Propyl Glycol 0.4-0.3 % SOLN, Apply to eye., Disp: , Rfl:  .  polyethylene glycol powder (GLYCOLAX/MIRALAX) powder, 255 grams one bottle for capsule endoscopy prep, Disp: 255 g, Rfl: 0 .  Tetrahydrozoline HCl (EYE DROPS OP), Apply to eye., Disp: , Rfl:  .  venlafaxine XR (EFFEXOR-XR) 150 MG 24 hr capsule, Take 1 capsule (150 mg total) by mouth daily with breakfast., Disp: 90 capsule, Rfl: 1 .  vitamin B-12 (CYANOCOBALAMIN) 1000 MCG tablet, Take 1,000 mcg by mouth at bedtime., Disp: , Rfl:   Allergies  Allergen Reactions  . Morphine Nausea And Vomiting    Ok with nausea medication  . Diazepam Nausea And Vomiting  . Lyrica [Pregabalin] Other (See Comments)    Joint swelling  . Metformin And Related     diarrhea  . Latex Rash  . Rash Away  [Petrolatum-Zinc Oxide] Rash and Swelling  . Salicylates Other (See Comments)    Other reaction(s): Headache  . Tape Rash    Please use paper tape     ROS  Constitutional: Negative for fever or weight change.  Respiratory: Negative for cough and shortness of breath.   Cardiovascular: Negative for chest pain or palpitations.  Gastrointestinal: Negative for abdominal pain, no bowel changes.  Musculoskeletal: Positive  for gait problem - needs to stop to take breaks, but no  joint swelling.  Skin: Negative for rash.  Neurological: Negative for dizziness or headache.  No other specific complaints in a complete review of systems (except as listed in HPI above).  Objective  Vitals:   08/13/17 1323  BP: 120/60  Pulse: 78  Resp: 16  SpO2: 96%  Weight: 224 lb 4.8 oz (101.7 kg)    Body mass index is 38.5 kg/m.  Physical Exam  Constitutional: Patient appears well-developed and  well-nourished. Obese  No distress.  HEENT: head atraumatic, normocephalic, pupils equal and reactive to light, neck supple, throat within normal limits Cardiovascular: Normal rate, regular rhythm and normal heart sounds.  No murmur heard. No BLE edema. Pulmonary/Chest: Effort normal and breath sounds normal. No respiratory distress. Abdominal: Soft.  There is no tenderness. Psychiatric: Patient has a normal mood and affect. behavior is normal. Judgment and thought content normal. Muscular skeletal: pain during palpation of lumbar spine and decrease rom, trigger points positive   PHQ2/9: Depression screen [facility] 2/9 01/11/2017 07/12/2016 04/14/2016 01/11/2016 12/10/2015  Decreased Interest 0 0 0 0 0  Down, Depressed, Hopeless 0 0 0 0 0  PHQ - 2 Score 0 0 0 0 0  Altered sleeping 2 - - - -  Tired, decreased energy 1 - - - -  Change in appetite 0 - - - -  Feeling bad or failure about yourself  0 - - - -  Trouble concentrating 0 - - - -  Moving slowly or fidgety/restless 0 - - - -  Suicidal thoughts 0 - - - -  PHQ-9 Score 3 - - - -  Difficult doing work/chores Somewhat difficult - - - -  Some recent data might be hidden     Fall Risk: Fall Risk  08/13/2017 04/13/2017 01/11/2017 07/12/2016 04/14/2016  Falls in the past year? No No No Yes Yes  Number falls in past yr: - - - 2 or more 1  Injury with Fall? - - - No  Yes  Risk Factor Category  - - - - -  Risk for fall due to : - - - - -  Follow up - - - - -      Assessment & Plan  1. Asthma-COPD overlap syndrome ([place])  Doing well on medication   2. Bipolar 1 disorder, mixed (HCC)  Continue medications, doing well  3. Angina pectoris ([place])  Symptoms free at this time  4. Adult hypothyroidism  Seeing Dr. Ronnald Collum  5. Pre-diabetes  Off metformin because of side effects  6. GAD (generalized anxiety disorder)  - venlafaxine XR (EFFEXOR-XR) 150 MG 24 hr capsule; Take 1 capsule (150 mg total) by mouth daily with breakfast.   Dispense: 90 capsule; Refill: 1  7. Migraine with aura and without status migrainosus, not intractable  Improved, still seeing neurologist   8. Fibromyalgia  Keep follow up with Dr. Harlin Rain   9. Gastroesophageal reflux disease without esophagitis  Seeing GI   10. Supraventricular tachycardia ([place])  Under control with metoprolol  11. Other chronic pain  Keep follow up with ortho and sending copy of x-ray to neurosurgeon in [place]. Essential hypertension  Discussed going down on dose of benicar hctz, but since bp still spikes and she is not having significant symptoms of hypotension we will continue dose for now and monitor  - olmesartan-hydrochlorothiazide (BENICAR HCT) 40-12.5 MG tablet; Take 1 tablet by mouth daily.  Dispense: 90 tablet; Refill: 1  13. Chronic seasonal allergic rhinitis  - montelukast (SINGULAIR) 10 MG tablet; Take 1 tablet (10 mg total) by mouth at bedtime.  Dispense: 90 tablet; Refill: 1  14. Mild episode of recurrent major depressive disorder (HCC)  - venlafaxine XR (EFFEXOR-XR) 150 MG 24 hr capsule; Take 1 capsule (150 mg total) by mouth daily with breakfast.  Dispense: 90 capsule; Refill: 1  15. Encounter for screening mammogram for breast cancer  - MM DIGITAL SCREENING BILATERAL; Future  16. Osteopenia after menopause  - DG Bone Density; Future

## 2017-08-14 DIAGNOSIS — E039 Hypothyroidism, unspecified: Secondary | ICD-10-CM | POA: Diagnosis not present

## 2017-08-17 ENCOUNTER — Ambulatory Visit (INDEPENDENT_AMBULATORY_CARE_PROVIDER_SITE_OTHER): Payer: Medicare Other

## 2017-08-17 VITALS — BP 130/62 | HR 68 | Temp 97.9°F | Resp 12 | Ht 64.0 in | Wt 220.0 lb

## 2017-08-17 DIAGNOSIS — Z0000 Encounter for general adult medical examination without abnormal findings: Secondary | ICD-10-CM

## 2017-08-17 NOTE — Progress Notes (Addendum)
Subjective:   Jacqueline Lucas is a 67 y.o. female who presents for Medicare Annual (Subsequent) preventive examination.  Review of Systems:  N/A Cardiac Risk Factors include: hypertension;dyslipidemia;obesity (BMI >30kg/m2);sedentary lifestyle;advanced age (>50men, >63 women)     Objective:     Vitals: BP 130/62 (BP Location: Left Arm, Patient Position: Sitting, Cuff Size: Large)   Pulse 68   Temp 97.9 F (36.6 C) (Oral)   Resp 12   Ht 5\' 4"  (1.626 m)   Wt 220 lb (99.8 kg)   SpO2 96%   BMI 37.76 kg/m   Body mass index is 37.76 kg/m.  Advanced Directives 08/17/2017 06/19/2017 10/13/2016 07/12/2016 04/14/2016 01/11/2016 11/10/2015  Does Patient Have a Medical Advance Directive? No Yes No No No No No  Does patient want to make changes to medical advance directive? - Yes (MAU/Ambulatory/Procedural Areas - Information given) - - - - -  Would patient like information on creating a medical advance directive? Yes (MAU/Ambulatory/Procedural Areas - Information given) - - - - No - patient declined information No - patient declined information    Tobacco Social History   Tobacco Use  Smoking Status Never Smoker  Smokeless Tobacco Never Used  Tobacco Comment   smoking cessation materials not required     Counseling given: No Comment: smoking cessation materials not required  Clinical Intake:  Pre-visit preparation completed: Yes  Pain : No/denies pain   BMI - recorded: 37.76 Nutritional Status: BMI > 30  Obese Nutritional Risks: None Diabetes: No  How often do you need to have someone help you when you read instructions, pamphlets, or other written materials from your doctor or pharmacy?: 1 - Never  Interpreter Needed?: No  Information entered by :: AEversole, LPN  Past Medical History:  Diagnosis Date  . Allergy   . Allergy to adhesive tape    Allergy to paper tape as well  . Alzheimer disease   . Anemia   . Anginal pain ([place])   . Anxiety   . Arthritis   .  Asthma   . Bronchitis   . Cataract   . Chronic nausea   . Claustrophobia   . DDD (degenerative disc disease), lumbar   . Depression   . Dry eyes    uses Restasis  . Fibromyalgia   . GERD (gastroesophageal reflux disease)   . H/O bladder infections   . Heart murmur   . Hematuria   . Hyperlipidemia   . Hypertension   . Hypothyroidism   . IBS (irritable bowel syndrome)   . Insomnia   . Lumbar neuritis   . Migraines   . Obesity   . Osteoporosis   . Palpitation   . PONV (postoperative nausea and vomiting)   . RLS (restless legs syndrome)   . Shoulder fracture, right   . [place] ([place])   . Tachycardia   . Thyroid disease    Past Surgical History:  Procedure Laterality Date  . ABDOMINAL HYSTERECTOMY     due to cancer of ovaries  . BACK SURGERY N/A 2014   x 5  . CARDIAC CATHETERIZATION     no PCI; 12/18/12 ([facility], Dr. Marcello Moores): EP study with ablation. No coronary angiography done then.  . COLONOSCOPY W/ POLYPECTOMY    . COLONOSCOPY WITH PROPOFOL N/A 02/04/2016   Procedure: COLONOSCOPY WITH PROPOFOL;  Surgeon: Robert Bellow, MD;  Location: [facility] ENDOSCOPY;  Service: Endoscopy;  Laterality: N/A;  . ESOPHAGOGASTRODUODENOSCOPY (EGD) WITH PROPOFOL N/A 02/04/2016   Procedure: ESOPHAGOGASTRODUODENOSCOPY (EGD)  WITH PROPOFOL;  Surgeon: Robert Bellow, MD;  Location[facility] ENDOSCOPY;  Service: Endoscopy;  Laterality: N/A;  . FINGER ARTHROSCOPY WITH CARPOMETACARPEL ([place]) ARTHROPLASTY Left 05/01/2016   Dr. Erlinda Hong  . GIVENS CAPSULE STUDY N/A 05/02/2016   Procedure: GIVENS CAPSULE STUDY;  Surgeon: Manya Silvas, MD;  Location: [facility] ENDOSCOPY;  Service: Endoscopy;  Laterality: N/A;  . HAND SURGERY    . HEMORRHOID SURGERY    . HIP ARTHROPLASTY    . JOINT REPLACEMENT Bilateral    knee  . KNEE ARTHROPLASTY    . REPLACEMENT TOTAL KNEE Left   . SPINE SURGERY     x 2  . tendonititis     right elbow, right wrist  . TOTAL HIP ARTHROPLASTY Left 2013  . TOTAL HIP ARTHROPLASTY Right 08/16/2015    Procedure: TOTAL HIP ARTHROPLASTY ANTERIOR APPROACH;  Surgeon: Meredith Pel, MD;  Location: [place];  Service: Orthopedics;  Laterality: Right;  . TOTAL KNEE ARTHROPLASTY Right 03/16/2015   Procedure: RIGHT TOTAL KNEE ARTHROPLASTY, PCL SACRIFICING;  Surgeon: Meredith Pel, MD;  Location: [place];  Service: Orthopedics;  Laterality: Right;   Family History  Problem Relation Age of Onset  . Diabetes Mother   . Hypertension Mother   . Heart disease Mother   . Alzheimer's disease Mother   . Cancer Father   . Diabetes Father   . Hypertension Sister   . Depression Sister   . Fibromyalgia Sister   . Cancer Brother   . Heart disease Brother   . Stroke Son   . Fibromyalgia Daughter   . Bladder Cancer Neg Hx   . Kidney disease Neg Hx   . Kidney cancer Neg Hx    Social History   Socioeconomic History  . Marital status: Married    Spouse name: Fritz Pickerel  . Number of children: 4  . Years of education: some college  . Highest education level: 12th grade  Occupational History  . Occupation: Retired  Scientific laboratory technician  . Financial resource strain: Not hard at all  . Food insecurity:    Worry: Never true    Inability: Never true  . Transportation needs:    Medical: No    Non-medical: No  Tobacco Use  . Smoking status: Never Smoker  . Smokeless tobacco: Never Used  . Tobacco comment: smoking cessation materials not required  Substance and Sexual Activity  . Alcohol use: No    Alcohol/week: 0.0 oz  . Drug use: No  . Sexual activity: Never  Lifestyle  . Physical activity:    Days per week: 3 days    Minutes per session: 30 min  . Stress: Not at all  Relationships  . Social connections:    Talks on phone: Not on file    Gets together: Not on file    Attends religious service: Not on file    Active member of club or organization: Not on file    Attends meetings of clubs or organizations: Not on file    Relationship status: Not on file  Other Topics Concern  . Not on file    Social History Narrative  . Not on file    Outpatient Encounter Medications as of 08/17/2017  Medication Sig  . aspirin 81 MG chewable tablet Chew 81 mg by mouth daily.  [name] atorvastatin (LIPITOR) 40 MG tablet Take 1 tablet (40 mg total) by mouth at bedtime.  . Cholecalciferol (D3-1000) 1000 units tablet Take 1,000 Units by mouth at bedtime.   [name]  cycloSPORINE (RESTASIS) 0.05 % ophthalmic emulsion Place 1 drop into both eyes 2 (two) times daily.   . divalproex (DEPAKOTE) 250 MG DR tablet Take 1 tablet (250 mg total) by mouth 2 (two) times daily.  [name] estradiol (ESTRACE VAGINAL) 0.1 MG/GM vaginal cream Apply 0.5mg  (pea-sized amount)  just inside the vaginal introitus with a finger-tip every night for two weeks and then Monday, Wednesday and Friday nights.  . gabapentin (NEURONTIN) 300 MG capsule Take 3 capsules (900 mg total) by mouth 3 (three) times daily.  . lansoprazole (PREVACID) 30 MG capsule Take 1 capsule (30 mg total) daily by mouth.  . levalbuterol (XOPENEX) 1.25 MG/3ML nebulizer solution Take 1.25 mg by nebulization every 4 (four) hours as needed for wheezing.  [name] levothyroxine (SYNTHROID) 88 MCG tablet Take 88 mcg by mouth daily before breakfast.   . magnesium oxide (MAG-OX) 400 MG tablet Take 400 mg by mouth at bedtime. Reported on 07/28/2015  . metoprolol tartrate (LOPRESSOR) 25 MG tablet TAKE 1 TABLET BY MOUTH TWICE A DAY  . mometasone-formoterol (DULERA) 200-5 MCG/ACT AERO Inhale 2 puffs into the lungs 2 (two) times daily. Reported on 07/28/2015  . montelukast (SINGULAIR) 10 MG tablet Take 1 tablet (10 mg total) by mouth at bedtime.  [name] olmesartan-hydrochlorothiazide (BENICAR HCT) 40-12.5 MG tablet Take 1 tablet by mouth daily.  [name] omega-3 acid ethyl esters (LOVAZA) 1 g capsule Take 2 capsules (2 g total) by mouth daily.  . ondansetron (ZOFRAN) 4 MG tablet Take 1 tablet (4 mg total) by mouth every 8 (eight) hours as needed for nausea or vomiting.  . venlafaxine XR (EFFEXOR-XR) 150 MG 24 hr  capsule Take 1 capsule (150 mg total) by mouth daily with breakfast.  . vitamin B-12 (CYANOCOBALAMIN) 1000 MCG tablet Take 1,000 mcg by mouth at bedtime.  . baclofen (LIORESAL) 10 MG tablet TAKE ONE TABLET AT 7AM AND ONE TABLET AT 2PM AS NEEDED FOR MUSCLE SPASM  . ibuprofen (ADVIL,MOTRIN) 600 MG tablet 600 mg 2 (two) times daily.   . mupirocin ointment (BACTROBAN) 2 % APPLY ON THE SKIN TWICE A DAY X 1 WEEK  . Polyethyl Glycol-Propyl Glycol 0.4-0.3 % SOLN Apply to eye.  . polyethylene glycol powder (GLYCOLAX/MIRALAX) powder 255 grams one bottle for capsule endoscopy prep  . Tetrahydrozoline HCl (EYE DROPS OP) Apply to eye.   No facility-administered encounter medications on file as of 08/17/2017.     Activities of Daily Living In your present state of health, do you have any difficulty performing the following activities: 08/17/2017 04/13/2017  Hearing? N N  Comment denies hearing aids -  Vision? N N  Comment wears eyeglasses -  Difficulty concentrating or making decisions? Y N  Comment short term memory loss -  Walking or climbing stairs? N N  Dressing or bathing? N N  Doing errands, shopping? N N  Preparing Food and eating ? N -  Comment lower partial dentures -  Using the Toilet? N -  In the past six months, have you accidently leaked urine? N -  Do you have problems with loss of bowel control? N -  Managing your Medications? N -  Managing your Finances? N -  Housekeeping or managing your Housekeeping? N -  Some recent data might be hidden    Patient Care Team: [name], MD as PCP - General (Family Medicine) Bary Castilla, Forest Gleason, MD (General Surgery) Bo Merino, MD as Consulting Physician (Rheumatology) Providence Lanius, MD as Referring Physician (Neurology) Yolonda Kida, MD as  Consulting Physician (Cardiology) Laneta Simmers as Physician Assistant (Urology) Vladimir Crofts, MD as Consulting Physician (Neurology) Marlou Sa, Tonna Corner, MD as Consulting  Physician (Orthopedic Surgery) Clyde Canterbury, MD as Consulting Physician (Otolaryngology) Gabriel Carina Betsey Holiday, MD as Physician Assistant (Endocrinology)    Assessment:   This is a routine wellness examination for Alanni.  Exercise Activities and Dietary recommendations Current Exercise Habits: Home exercise routine, Type of exercise: strength training/weights, Time (Minutes): 30, Frequency (Times/Week): 3, Weekly Exercise (Minutes/Week): 90, Intensity: Mild, Exercise limited by: Other - see comments(fibromyalgia)  Goals    . DIET - Make healthy food choices     Recommend to decrease portion sizes by eating 3 small healthy meals and at least 2 healthy snacks per day.       Fall Risk Fall Risk  08/17/2017 08/13/2017 04/13/2017 01/11/2017 07/12/2016  Falls in the past year? No No No No Yes  Number falls in past yr: - - - - 2 or more  Injury with Fall? - - - - No  Risk Factor Category  - - - - -  Risk for fall due to : Impaired vision;Impaired balance/gait;Medication side effect;History of fall(s);Other (Comment) - - - -  Risk for fall due to: Comment wears eyeglasses; syncope, fibromyalgia - - - -  Follow up - - - - -   [place]: Is your home free of loose throw rugs in walkways, pet beds, electrical cords, etc? Yes Is there adequate lighting in your home to reduce risk of falls?  Yes Are there stairs in or around your home WITH handrails? No  ASSISTIVE DEVICES UTILIZED TO PREVENT FALLS: Use of a cane, walker or w/c? No Grab bars in the bathroom? Yes  Shower chair or a place to sit while bathing? Yes An elevated toilet seat or a handicapped toilet? Yes  Timed Get Up and Go Performed: Yes. Pt ambulated 10 feet within 22 sec. Gait slow, steady and without the use of an assistive device. No intervention required at this time. Fall risk prevention has been discussed.  Community Resource Referral:  [place] Referral not required at this time.    Depression Screen PHQ 2/9 Scores 08/17/2017 08/13/2017 01/11/2017 07/12/2016  PHQ - 2 Score 0 2 0 0  PHQ- 9 Score 0 2 3 -     Cognitive Function     6CIT Screen 08/17/2017  What Year? 0 points  What month? 0 points  What time? 0 points  Count back from 20 0 points  Months in reverse 4 points  Repeat phrase 10 points  Total Score 14    Immunization History  Administered Date(s) Administered  . Influenza, High Dose Seasonal PF 02/15/2015, 11/10/2015, 01/11/2017  . Influenza-Unspecified 02/25/2014  . Pneumococcal Conjugate-13 06/03/2013  . Pneumococcal Polysaccharide-23 09/05/2011, 01/11/2017  . Tdap 09/05/2011  . Zoster 12/12/2010    Qualifies for Shingles Vaccine? Yes. Zostavax completed 12/12/10. Due for Shingrix. Education has been provided regarding the importance of this vaccine. Pt has been advised to call her insurance company to determine her out of pocket expense. Advised she may also receive this vaccine at her local pharmacy or Health Dept. Verbalized acceptance and understanding.   Screening Tests Health Maintenance  Topic Date Due  . MAMMOGRAM  07/10/2017  . INFLUENZA VACCINE  09/27/2017  . TETANUS/TDAP  09/04/2021  . COLONOSCOPY  02/03/2026  . DEXA SCAN  Completed  . Hepatitis C Screening  Completed  . PNA vac Low Risk  Adult  Completed    Cancer Screenings: Lung: Low Dose CT Chest recommended if Age 86-80 years, 30 pack-year currently smoking OR have quit w/in 15years. Patient does not qualify. Breast:  Up to date on Mammogram? No. Completed 07/10/16. Repeat every year. Ordered by Dr. Ancil Boozer on 08/13/17. Pt has been advised to schedule appt for completion. Contact information provided.   Up to date of Bone Density/Dexa? Yes. Completed 05/13/13. Repeat every 2 years. Ordered by Dr. Ancil Boozer on 08/13/17. Pt has been advised to schedule appt for completion. Contact information provided.  Colorectal: Completed 02/04/16. Repeat every 10 years.  Additional  Screenings: Hepatitis C Screening: Completed 04/23/12    Plan:  I have personally reviewed and addressed the Medicare Annual Wellness questionnaire and have noted the following in the patient's chart:  A. Medical and social history B. Use of alcohol, tobacco or illicit drugs  C. Current medications and supplements D. Functional ability and status E.  Nutritional status F.  Physical activity G. Advance directives H. List of other physicians I.  Hospitalizations, surgeries, and ER visits in previous 12 months J.  [place] such as hearing and vision if needed, cognitive and depression L. Referrals and appointments  In addition, I have reviewed and discussed with patient certain preventive protocols, quality metrics, and best practice recommendations. A written personalized care plan for preventive services as well as general preventive health recommendations were provided to patient.  See attached scanned questionnaire for additional information.   Signed,  Aleatha Borer, LPN Nurse Health Advisor   I have reviewed this encounter including the documentation in this note and/or discussed this patient with the provider, Aleatha Borer, LPN. I am certifying that I agree with the content of this note as supervising physician.  [name], MD [facility] 08/17/2017, 4:23 PM

## 2017-08-17 NOTE — Patient Instructions (Signed)
Jacqueline Lucas , Thank you for taking time to come for your Medicare Wellness Visit. I appreciate your ongoing commitment to your health goals. Please review the following plan we discussed and let me know if I can assist you in the future.   Screening recommendations/referrals: Colorectal Screening: Up to date Mammogram: Please call to schedule your appointment Bone Density: Up to date Lung Cancer Screening: You do not qualify for this screening Hepatitis C Screening: Up to date  Vision and Dental Exams: Recommended annual ophthalmology exams for early detection of glaucoma and other disorders of the eye Recommended annual dental exams for proper oral hygiene  Vaccinations: Influenza vaccine: Up to date Pneumococcal vaccine: Up to date Tdap vaccine: Up to date Shingles vaccine: Please call your insurance company to determine your out of pocket expense for the Shingrix vaccine. You may also receive this vaccine at your local pharmacy or Health Dept.   Advanced directives: Advance directive discussed with you today. I have provided a copy for you to complete at home and have notarized. Once this is complete please bring a copy in to our office so we can scan it into your chart.  Goals: Recommend to decrease portion sizes by eating 3 small healthy meals and at least 2 healthy snacks per day.  Next appointment: Please schedule your Annual Wellness Visit with your Nurse Health Advisor in one year.  Preventive Care 32 Years and Older, Female Preventive care refers to lifestyle choices and visits with your health care provider that can promote health and wellness. What does preventive care include?  A yearly physical exam. This is also called an annual well check.  Dental exams once or twice a year.  Routine eye exams. Ask your health care provider how often you should have your eyes checked.  Personal lifestyle choices, including:  Daily care of your teeth and gums.  Regular  physical activity.  Eating a healthy diet.  Avoiding tobacco and drug use.  Limiting alcohol use.  Practicing safe sex.  Taking low-dose aspirin every day.  Taking vitamin and mineral supplements as recommended by your health care provider. What happens during an annual well check? The services and screenings done by your health care provider during your annual well check will depend on your age, overall health, lifestyle risk factors, and family history of disease. Counseling  Your health care provider may ask you questions about your:  Alcohol use.  Tobacco use.  Drug use.  Emotional well-being.  Home and relationship well-being.  Sexual activity.  Eating habits.  History of falls.  Memory and ability to understand (cognition).  Work and work Statistician.  Reproductive health. Screening  You may have the following tests or measurements:  Height, weight, and BMI.  Blood pressure.  Lipid and cholesterol levels. These may be checked every 5 years, or more frequently if you are over 76 years old.  Skin check.  Lung cancer screening. You may have this screening every year starting at age 51 if you have a 30-pack-year history of smoking and currently smoke or have quit within the past 15 years.  Fecal occult blood test (FOBT) of the stool. You may have this test every year starting at age 33.  Flexible sigmoidoscopy or colonoscopy. You may have a sigmoidoscopy every 5 years or a colonoscopy every 10 years starting at age 85.  Hepatitis C blood test.  Hepatitis B blood test.  Sexually transmitted disease (STD) testing.  Diabetes screening. This is done by checking your  blood sugar (glucose) after you have not eaten for a while (fasting). You may have this done every 1-3 years.  Bone density scan. This is done to screen for osteoporosis. You may have this done starting at age 32.  Mammogram. This may be done every 1-2 years. Talk to your health care  provider about how often you should have regular mammograms. Talk with your health care provider about your test results, treatment options, and if necessary, the need for more tests. Vaccines  Your health care provider may recommend certain vaccines, such as:  Influenza vaccine. This is recommended every year.  Tetanus, diphtheria, and acellular pertussis (Tdap, Td) vaccine. You may need a Td booster every 10 years.  Zoster vaccine. You may need this after age 16.  Pneumococcal 13-valent conjugate (PCV13) vaccine. One dose is recommended after age 13.  Pneumococcal polysaccharide (PPSV23) vaccine. One dose is recommended after age 10. Talk to your health care provider about which screenings and vaccines you need and how often you need them. This information is not intended to replace advice given to you by your health care provider. Make sure you discuss any questions you have with your health care provider. Document Released: 03/12/2015 Document Revised: 11/03/2015 Document Reviewed: 12/15/2014 Elsevier Interactive Patient Education  2017 [place] Prevention in the Home Falls can cause injuries. They can happen to people of all ages. There are many things you can do to make your home safe and to help prevent falls. What can I do on the outside of my home?  Regularly fix the edges of walkways and driveways and fix any cracks.  Remove anything that might make you trip as you walk through a door, such as a raised step or threshold.  Trim any bushes or trees on the path to your home.  Use bright outdoor lighting.  Clear any walking paths of anything that might make someone trip, such as rocks or tools.  Regularly check to see if handrails are loose or broken. Make sure that both sides of any steps have handrails.  Any raised decks and porches should have guardrails on the edges.  Have any leaves, snow, or ice cleared regularly.  Use sand or salt on walking paths  during winter.  Clean up any spills in your garage right away. This includes oil or grease spills. What can I do in the bathroom?  Use night lights.  Install grab bars by the toilet and in the tub and shower. Do not use towel bars as grab bars.  Use non-skid mats or decals in the tub or shower.  If you need to sit down in the shower, use a plastic, non-slip stool.  Keep the floor dry. Clean up any water that spills on the floor as soon as it happens.  Remove soap buildup in the tub or shower regularly.  Attach bath mats securely with double-sided non-slip rug tape.  Do not have throw rugs and other things on the floor that can make you trip. What can I do in the bedroom?  Use night lights.  Make sure that you have a light by your bed that is easy to reach.  Do not use any sheets or blankets that are too big for your bed. They should not hang down onto the floor.  Have a firm chair that has side arms. You can use this for support while you get dressed.  Do not have throw rugs and other things on the floor that  can make you trip. What can I do in the kitchen?  Clean up any spills right away.  Avoid walking on wet floors.  Keep items that you use a lot in easy-to-reach places.  If you need to reach something above you, use a strong step stool that has a grab bar.  Keep electrical cords out of the way.  Do not use floor polish or wax that makes floors slippery. If you must use wax, use non-skid floor wax.  Do not have throw rugs and other things on the floor that can make you trip. What can I do with my stairs?  Do not leave any items on the stairs.  Make sure that there are handrails on both sides of the stairs and use them. Fix handrails that are broken or loose. Make sure that handrails are as long as the stairways.  Check any carpeting to make sure that it is firmly attached to the stairs. Fix any carpet that is loose or worn.  Avoid having throw rugs at the top  or bottom of the stairs. If you do have throw rugs, attach them to the floor with carpet tape.  Make sure that you have a light switch at the top of the stairs and the bottom of the stairs. If you do not have them, ask someone to add them for you. What else can I do to help prevent falls?  Wear shoes that:  Do not have high heels.  Have rubber bottoms.  Are comfortable and fit you well.  Are closed at the toe. Do not wear sandals.  If you use a stepladder:  Make sure that it is fully opened. Do not climb a closed stepladder.  Make sure that both sides of the stepladder are locked into place.  Ask someone to hold it for you, if possible.  Clearly mark and make sure that you can see:  Any grab bars or handrails.  First and last steps.  Where the edge of each step is.  Use tools that help you move around (mobility aids) if they are needed. These include:  Canes.  Walkers.  Scooters.  Crutches.  Turn on the lights when you go into a dark area. Replace any light bulbs as soon as they burn out.  Set up your furniture so you have a clear path. Avoid moving your furniture around.  If any of your floors are uneven, fix them.  If there are any pets around you, be aware of where they are.  Review your medicines with your doctor. Some medicines can make you feel dizzy. This can increase your chance of falling. Ask your doctor what other things that you can do to help prevent falls. This information is not intended to replace advice given to you by your health care provider. Make sure you discuss any questions you have with your health care provider. Document Released: 12/10/2008 Document Revised: 07/22/2015 Document Reviewed: 03/20/2014 Elsevier Interactive Patient Education  2017 [employer].

## 2017-08-27 DIAGNOSIS — M4726 Other spondylosis with radiculopathy, lumbar region: Secondary | ICD-10-CM | POA: Diagnosis not present

## 2017-08-27 DIAGNOSIS — M48062 Spinal stenosis, lumbar region with neurogenic claudication: Secondary | ICD-10-CM | POA: Diagnosis not present

## 2017-08-27 DIAGNOSIS — I10 Essential (primary) hypertension: Secondary | ICD-10-CM | POA: Diagnosis not present

## 2017-08-27 DIAGNOSIS — Z7189 Other specified counseling: Secondary | ICD-10-CM | POA: Diagnosis not present

## 2017-08-27 DIAGNOSIS — Z6837 Body mass index (BMI) 37.0-37.9, adult: Secondary | ICD-10-CM | POA: Diagnosis not present

## 2017-08-29 ENCOUNTER — Telehealth: Payer: Self-pay | Admitting: Family Medicine

## 2017-08-29 NOTE — Telephone Encounter (Signed)
Copied from [place] [phone] [phone]. Topic: Quick Communication - Rx Refill/Question >> Aug 29, 2017  3:31 PM Waylan Rocher, Lumin L wrote: Medication: atorvastatin (LIPITOR) 40 MG tablet  Has the patient contacted their pharmacy? Yes (Agent: If no, request that the patient contact the pharmacy for the refill.) (Agent: If yes, when and what did the pharmacy advise?)  Preferred Pharmacy (with phone number or street name): [id number] [place] [address] [place] [address] Phone: [phone] Fax: [phone]   Agent: Please be advised that RX refills may take up to 3 business days. We ask that you follow-up with your pharmacy.

## 2017-08-31 NOTE — Telephone Encounter (Signed)
Patient has refills at pharmacy. I called and they stated it was to early for refills. May refill on 09/02/17. Patient notified

## 2017-09-06 ENCOUNTER — Encounter (INDEPENDENT_AMBULATORY_CARE_PROVIDER_SITE_OTHER): Payer: Self-pay | Admitting: Vascular Surgery

## 2017-09-06 ENCOUNTER — Ambulatory Visit (INDEPENDENT_AMBULATORY_CARE_PROVIDER_SITE_OTHER): Payer: Medicare Other | Admitting: Vascular Surgery

## 2017-09-06 VITALS — BP 126/61 | HR 67 | Resp 15 | Ht 65.0 in | Wt 226.0 lb

## 2017-09-06 DIAGNOSIS — I10 Essential (primary) hypertension: Secondary | ICD-10-CM | POA: Diagnosis not present

## 2017-09-06 DIAGNOSIS — I209 Angina pectoris, unspecified: Secondary | ICD-10-CM

## 2017-09-06 DIAGNOSIS — I872 Venous insufficiency (chronic) (peripheral): Secondary | ICD-10-CM | POA: Diagnosis not present

## 2017-09-06 DIAGNOSIS — I25118 Atherosclerotic heart disease of native coronary artery with other forms of angina pectoris: Secondary | ICD-10-CM

## 2017-09-06 DIAGNOSIS — I83813 Varicose veins of bilateral lower extremities with pain: Secondary | ICD-10-CM | POA: Diagnosis not present

## 2017-09-06 DIAGNOSIS — J4541 Moderate persistent asthma with (acute) exacerbation: Secondary | ICD-10-CM

## 2017-09-06 NOTE — Progress Notes (Signed)
MRN : [id number]  VALORIE MCGRORY is a 67 y.o. (13-Feb-1951) female who presents with chief complaint of  Chief Complaint  Patient presents with  . Follow-up    3-4 week post laser  .  History of Present Illness:   Left Leg GSV laser ablation on 08/06/2017 Right Great Saphenous Laser ablation 08/02/2017  The patient returns to the office for followup status post laser ablation of the saphenous vein as noted above.  The patient note significant improvement in the lower extremity pain but not resolution of the symptoms. The patient notes multiple residual varicosities bilaterally which continued to hurt with dependent positions and remained tender to palpation. The patient's swelling is minimally from preoperative status. The patient continues to wear graduated compression stockings on a daily basis but these are not eliminating the pain and discomfort. The patient continues to use over-the-counter anti-inflammatory medications to treat the pain and related symptoms but this has not given the patient relief. The patient notes the pain in the lower extremities is causing problems with daily exercise, problems at work and even with household activities such as preparing meals and doing dishes.  The patient is otherwise done well and there have been no complications related to the laser procedure or interval changes in the patient's overall   Post laser ultrasound shows successful ablation of the bilateral GSV   Current Meds  Medication Sig  . aspirin 81 MG chewable tablet Chew 81 mg by mouth daily.  [name] atorvastatin (LIPITOR) 40 MG tablet Take 1 tablet (40 mg total) by mouth at bedtime.  . baclofen (LIORESAL) 10 MG tablet TAKE ONE TABLET AT 7AM AND ONE TABLET AT 2PM AS NEEDED FOR MUSCLE SPASM  . Cholecalciferol (D3-1000) 1000 units tablet Take 1,000 Units by mouth at bedtime.   . cycloSPORINE (RESTASIS) 0.05 % ophthalmic emulsion Place 1 drop into both eyes 2 (two) times daily.   .  divalproex (DEPAKOTE) 250 MG DR tablet Take 1 tablet (250 mg total) by mouth 2 (two) times daily.  [name] estradiol (ESTRACE VAGINAL) 0.1 MG/GM vaginal cream Apply 0.5mg  (pea-sized amount)  just inside the vaginal introitus with a finger-tip every night for two weeks and then Monday, Wednesday and Friday nights.  . gabapentin (NEURONTIN) 300 MG capsule Take 3 capsules (900 mg total) by mouth 3 (three) times daily.  [name] ibuprofen (ADVIL,MOTRIN) 600 MG tablet 600 mg 2 (two) times daily.   . lansoprazole (PREVACID) 30 MG capsule Take 1 capsule (30 mg total) daily by mouth.  . levalbuterol (XOPENEX) 1.25 MG/3ML nebulizer solution Take 1.25 mg by nebulization every 4 (four) hours as needed for wheezing.  [name] levothyroxine (SYNTHROID) 88 MCG tablet Take 88 mcg by mouth daily before breakfast.   . magnesium oxide (MAG-OX) 400 MG tablet Take 400 mg by mouth at bedtime. Reported on 07/28/2015  . metoprolol tartrate (LOPRESSOR) 25 MG tablet TAKE 1 TABLET BY MOUTH TWICE A DAY  . mometasone-formoterol (DULERA) 200-5 MCG/ACT AERO Inhale 2 puffs into the lungs 2 (two) times daily. Reported on 07/28/2015  . montelukast (SINGULAIR) 10 MG tablet Take 1 tablet (10 mg total) by mouth at bedtime.  . mupirocin ointment (BACTROBAN) 2 % APPLY ON THE SKIN TWICE A DAY X 1 WEEK  . nystatin cream (MYCOSTATIN) APPLY TO CORNERS OF MOUTH 3 TIMES DAILY AFTER EATING  . olmesartan-hydrochlorothiazide (BENICAR HCT) 40-12.5 MG tablet Take 1 tablet by mouth daily.  [name] omega-3 acid ethyl esters (LOVAZA) 1 g capsule Take 2 capsules (2  g total) by mouth daily.  . ondansetron (ZOFRAN) 4 MG tablet Take 1 tablet (4 mg total) by mouth every 8 (eight) hours as needed for nausea or vomiting.  Vladimir Faster Glycol-Propyl Glycol 0.4-0.3 % SOLN Apply to eye.  . polyethylene glycol powder (GLYCOLAX/MIRALAX) powder 255 grams one bottle for capsule endoscopy prep  . Tetrahydrozoline HCl (EYE DROPS OP) Apply to eye.  . venlafaxine XR (EFFEXOR-XR) 150 MG 24 hr  capsule Take 1 capsule (150 mg total) by mouth daily with breakfast.  . vitamin B-12 (CYANOCOBALAMIN) 1000 MCG tablet Take 1,000 mcg by mouth at bedtime.    Past Medical History:  Diagnosis Date  . Allergy   . Allergy to adhesive tape    Allergy to paper tape as well  . Alzheimer disease   . Anemia   . Anginal pain ([place])   . Anxiety   . Arthritis   . Asthma   . Bronchitis   . Cataract   . Chronic nausea   . Claustrophobia   . DDD (degenerative disc disease), lumbar   . Depression   . Dry eyes    uses Restasis  . Fibromyalgia   . GERD (gastroesophageal reflux disease)   . H/O bladder infections   . Heart murmur   . Hematuria   . Hyperlipidemia   . Hypertension   . Hypothyroidism   . IBS (irritable bowel syndrome)   . Insomnia   . Lumbar neuritis   . Migraines   . Obesity   . Osteoporosis   . Palpitation   . PONV (postoperative nausea and vomiting)   . RLS (restless legs syndrome)   . Shoulder fracture, right   . Sicca ([place])   . Tachycardia   . Thyroid disease     Past Surgical History:  Procedure Laterality Date  . ABDOMINAL HYSTERECTOMY     due to cancer of ovaries  . BACK SURGERY N/A 2014   x 5  . CARDIAC CATHETERIZATION     no PCI; 12/18/12 ([facility], Dr. Marcello Moores): EP study with ablation. No coronary angiography done then.  . COLONOSCOPY W/ POLYPECTOMY    . COLONOSCOPY WITH PROPOFOL N/A 02/04/2016   Procedure: COLONOSCOPY WITH PROPOFOL;  Surgeon: Robert Bellow, MD;  Location: [facility] ENDOSCOPY;  Service: Endoscopy;  Laterality: N/A;  . ESOPHAGOGASTRODUODENOSCOPY (EGD) WITH PROPOFOL N/A 02/04/2016   Procedure: ESOPHAGOGASTRODUODENOSCOPY (EGD) WITH PROPOFOL;  Surgeon: Robert Bellow, MD;  Location: [facility] ENDOSCOPY;  Service: Endoscopy;  Laterality: N/A;  . FINGER ARTHROSCOPY WITH CARPOMETACARPEL ([place]) ARTHROPLASTY Left 05/01/2016   Dr. Erlinda Hong  . GIVENS CAPSULE STUDY N/A 05/02/2016   Procedure: GIVENS CAPSULE STUDY;  Surgeon: Manya Silvas, MD;  Location: [facility]  ENDOSCOPY;  Service: Endoscopy;  Laterality: N/A;  . HAND SURGERY    . HEMORRHOID SURGERY    . HIP ARTHROPLASTY    . JOINT REPLACEMENT Bilateral    knee  . KNEE ARTHROPLASTY    . REPLACEMENT TOTAL KNEE Left   . SPINE SURGERY     x 2  . tendonititis     right elbow, right wrist  . TOTAL HIP ARTHROPLASTY Left 2013  . TOTAL HIP ARTHROPLASTY Right 08/16/2015   Procedure: TOTAL HIP ARTHROPLASTY ANTERIOR APPROACH;  Surgeon: Meredith Pel, MD;  Location: [facility];  Service: Orthopedics;  Laterality: Right;  . TOTAL KNEE ARTHROPLASTY Right 03/16/2015   Procedure: RIGHT TOTAL KNEE ARTHROPLASTY, PCL SACRIFICING;  Surgeon: Meredith Pel, MD;  Location: [facility];  Service: Orthopedics;  Laterality: Right;  Social History Social History   Tobacco Use  . Smoking status: Never Smoker  . Smokeless tobacco: Never Used  . Tobacco comment: smoking cessation materials not required  Substance Use Topics  . Alcohol use: No    Alcohol/week: 0.0 oz  . Drug use: No    Family History Family History  Problem Relation Age of Onset  . Diabetes Mother   . Hypertension Mother   . Heart disease Mother   . Alzheimer's disease Mother   . Cancer Father   . Diabetes Father   . Hypertension Sister   . Depression Sister   . Fibromyalgia Sister   . Cancer Brother   . Heart disease Brother   . Stroke Son   . Fibromyalgia Daughter   . Bladder Cancer Neg Hx   . Kidney disease Neg Hx   . Kidney cancer Neg Hx     Allergies  Allergen Reactions  . Morphine Nausea And Vomiting    Ok with nausea medication  . Diazepam Nausea And Vomiting  . Lyrica [Pregabalin] Other (See Comments)    Joint swelling  . Metformin And Related     diarrhea  . Latex Rash  . Rash Away  [Petrolatum-Zinc Oxide] Rash and Swelling  . Salicylates Other (See Comments)    Other reaction(s): Headache  . Tape Rash    Please use paper tape     REVIEW OF SYSTEMS (Negative unless checked)  Constitutional: [] Weight loss   [] Fever  [] Chills Cardiac: [] Chest pain   [] Chest pressure   [] Palpitations   [] Shortness of breath when laying flat   [] Shortness of breath with exertion. Vascular:  [] Pain in legs with walking   [x] Pain in legs at rest  [] History of DVT   [] Phlebitis   [x] Swelling in legs   [x] Varicose veins   [] Non-healing ulcers Pulmonary:   [] Uses home oxygen   [] Productive cough   [] Hemoptysis   [] Wheeze  [] COPD   [] Asthma Neurologic:  [] Dizziness   [] Seizures   [] History of stroke   [] History of TIA  [] Aphasia   [] Vissual changes   [] Weakness or numbness in arm   [] Weakness or numbness in leg Musculoskeletal:   [] Joint swelling   [] Joint pain   [] Low back pain Hematologic:  [] Easy bruising  [] Easy bleeding   [] Hypercoagulable state   [] Anemic Gastrointestinal:  [] Diarrhea   [] Vomiting  [] Gastroesophageal reflux/heartburn   [] Difficulty swallowing. Genitourinary:  [] Chronic kidney disease   [] Difficult urination  [] Frequent urination   [] Blood in urine Skin:  [] Rashes   [] Ulcers  Psychological:  [] History of anxiety   []  History of major depression.  Physical Examination  Vitals:   09/06/17 1335  BP: 126/61  Pulse: 67  Resp: 15  Weight: 226 lb (102.5 kg)  Height: 5\' 5"  (1.651 m)   Body mass index is 37.61 kg/m. Gen: WD/WN, NAD Head: [place]/AT, No temporalis wasting.  Ear/Nose/Throat: Hearing grossly intact, nares w/o erythema or drainage Eyes: PER, EOMI, sclera nonicteric.  Neck: Supple, no large masses.   Pulmonary:  Good air movement, no audible wheezing bilaterally, no use of accessory muscles.  Cardiac: RRR, no JVD Vascular: Large varicosities present extensively greater than 10 mm biloaterally.  Mild venous stasis changes to the legs bilaterally.  2+ soft pitting edema Vessel Right Left  Radial Palpable Palpable  PT Palpable Palpable  DP Palpable Palpable  Gastrointestinal: Non-distended. No guarding/no peritoneal signs.  Musculoskeletal: M/S 5/5 throughout.  No deformity or atrophy.    Neurologic: CN 2-12 intact. Symmetrical.  Speech is fluent. Motor exam as listed above. Psychiatric: Judgment intact, Mood & affect appropriate for pt's clinical situation. Dermatologic: venous rashes no ulcers noted.  No changes consistent with cellulitis. Lymph : No lichenification or skin changes of chronic lymphedema.  CBC Lab Results  Component Value Date   WBC 8.0 12/04/2016   HGB 13.2 12/04/2016   HCT 38.6 12/04/2016   MCV 88.9 12/04/2016   PLT 230 12/04/2016    BMET    Component Value Date/Time   NA 140 12/04/2016 0857   NA 140 02/15/2015 1542   NA 138 07/18/2013 0546   K 4.3 12/04/2016 0857   K 3.9 03/02/2014 0810   CL 102 12/04/2016 0857   CL 104 07/18/2013 0546   CO2 29 12/04/2016 0857   CO2 24 07/18/2013 0546   GLUCOSE 107 (H) 12/04/2016 0857   GLUCOSE 124 (H) 07/18/2013 0546   BUN 19 12/04/2016 0857   BUN 14 07/06/2015 1046   BUN 7 07/18/2013 0546   CREATININE 0.80 12/04/2016 0857   CALCIUM 9.3 12/04/2016 0857   CALCIUM 8.7 07/18/2013 0546   GFRNONAA 76 12/04/2016 0857   GFRAA 88 12/04/2016 0857   CrCl cannot be calculated (Patient's most recent lab result is older than the maximum 21 days allowed.).  COAG Lab Results  Component Value Date   INR 1.61 (H) 03/18/2015   INR 1.18 03/17/2015   INR 1.14 03/16/2015    Radiology No results found.  Assessment/Plan 1. Varicose veins of both lower extremities with pain Recommend:  The patient has had successful ablation of the previously incompetent saphenous venous system but still has persistent symptoms of pain and swelling that are having a negative impact on daily life and daily activities.  Patient should undergo injection sclerotherapy to treat the residual varicosities.  Foam sclerotherapy will be utilized the first time or two  The risks, benefits and alternative therapies were reviewed in detail with the patient.  All questions were answered.  The patient agrees to proceed with sclerotherapy  at their convenience.  The patient will continue wearing the graduated compression stockings and using the over-the-counter pain medications to treat her symptoms.     2. Chronic venous insufficiency No surgery or intervention at this point in time.    I have had a long discussion with the patient regarding venous insufficiency and why it  causes symptoms. I have discussed with the patient the chronic skin changes that accompany venous insufficiency and the long term sequela such as infection and ulceration.  Patient will begin wearing graduated compression stockings class 1 (20-30 mmHg) or compression wraps on a daily basis a prescription was given. The patient will put the stockings on first thing in the morning and removing them in the evening. The patient is instructed specifically not to sleep in the stockings.    In addition, behavioral modification including several periods of elevation of the lower extremities during the day will be continued. I have demonstrated that proper elevation is a position with the ankles at heart level.  The patient is instructed to begin routine exercise, especially walking on a daily basis   3. Coronary artery disease of native artery of native heart with stable angina pectoris (HCC) Continue cardiac and antihypertensive medications as already ordered and reviewed, no changes at this time.  Continue statin as ordered and reviewed, no changes at this time  Nitrates PRN for chest pain   4. Essential hypertension Continue antihypertensive medications as already ordered, these medications have  been reviewed and there are no changes at this time.   5. Moderate persistent asthma with acute exacerbation Continue pulmonary medications and aerosols as already ordered, these medications have been reviewed and there are no changes at this time.      Hortencia Pilar, MD  09/06/2017 1:46 PM

## 2017-09-07 ENCOUNTER — Encounter (INDEPENDENT_AMBULATORY_CARE_PROVIDER_SITE_OTHER): Payer: Self-pay | Admitting: Vascular Surgery

## 2017-09-11 ENCOUNTER — Ambulatory Visit: Payer: Self-pay | Admitting: Family Medicine

## 2017-09-11 ENCOUNTER — Other Ambulatory Visit: Payer: Self-pay

## 2017-09-11 ENCOUNTER — Emergency Department
Admission: EM | Admit: 2017-09-11 | Discharge: 2017-09-11 | Disposition: A | Discharge status: Home / Self Care | Payer: Medicare Other | Attending: Emergency Medicine | Admitting: Emergency Medicine

## 2017-09-11 ENCOUNTER — Emergency Department: Payer: Medicare Other

## 2017-09-11 DIAGNOSIS — Z96651 Presence of right artificial knee joint: Secondary | ICD-10-CM | POA: Insufficient documentation

## 2017-09-11 DIAGNOSIS — Z96642 Presence of left artificial hip joint: Secondary | ICD-10-CM | POA: Diagnosis not present

## 2017-09-11 DIAGNOSIS — Z91040 Latex allergy status: Secondary | ICD-10-CM | POA: Insufficient documentation

## 2017-09-11 DIAGNOSIS — E039 Hypothyroidism, unspecified: Secondary | ICD-10-CM | POA: Insufficient documentation

## 2017-09-11 DIAGNOSIS — R51 Headache: Secondary | ICD-10-CM | POA: Insufficient documentation

## 2017-09-11 DIAGNOSIS — Z79899 Other long term (current) drug therapy: Secondary | ICD-10-CM | POA: Insufficient documentation

## 2017-09-11 DIAGNOSIS — J45909 Unspecified asthma, uncomplicated: Secondary | ICD-10-CM | POA: Insufficient documentation

## 2017-09-11 DIAGNOSIS — Z96652 Presence of left artificial knee joint: Secondary | ICD-10-CM | POA: Insufficient documentation

## 2017-09-11 DIAGNOSIS — G309 Alzheimer's disease, unspecified: Secondary | ICD-10-CM | POA: Diagnosis not present

## 2017-09-11 DIAGNOSIS — Z7982 Long term (current) use of aspirin: Secondary | ICD-10-CM | POA: Insufficient documentation

## 2017-09-11 DIAGNOSIS — R61 Generalized hyperhidrosis: Secondary | ICD-10-CM | POA: Diagnosis not present

## 2017-09-11 DIAGNOSIS — I10 Essential (primary) hypertension: Secondary | ICD-10-CM | POA: Insufficient documentation

## 2017-09-11 DIAGNOSIS — Z96641 Presence of right artificial hip joint: Secondary | ICD-10-CM | POA: Diagnosis not present

## 2017-09-11 DIAGNOSIS — R197 Diarrhea, unspecified: Secondary | ICD-10-CM | POA: Insufficient documentation

## 2017-09-11 DIAGNOSIS — R42 Dizziness and giddiness: Secondary | ICD-10-CM | POA: Diagnosis not present

## 2017-09-11 DIAGNOSIS — R0789 Other chest pain: Secondary | ICD-10-CM | POA: Diagnosis not present

## 2017-09-11 LAB — BASIC METABOLIC PANEL
Anion gap: 9 (ref 5–15)
BUN: 13 mg/dL (ref 8–23)
CO2: 27 mmol/L (ref 22–32)
Calcium: 8.9 mg/dL (ref 8.9–10.3)
Chloride: 102 mmol/L (ref 98–111)
Creatinine, Ser: 0.78 mg/dL (ref 0.44–1.00)
GFR calc Af Amer: >60 mL/min (ref >60)
GFR calc non Af Amer: >60 mL/min (ref >60)
Glucose, Bld: 161 mg/dL — ABNORMAL HIGH (ref 70–99)
Potassium: 4.3 mmol/L (ref 3.5–5.1)
Sodium: 138 mmol/L (ref 135–145)

## 2017-09-11 LAB — CBC
HCT: 36.3 % (ref 35.0–47.0)
Hemoglobin: 12.4 g/dL (ref 12.0–16.0)
MCH: 31.8 pg (ref 26.0–34.0)
MCHC: 34.1 g/dL (ref 32.0–36.0)
MCV: 93.4 fL (ref 80.0–100.0)
Platelets: 211 10*3/uL (ref 150–440)
RBC: 3.88 MIL/uL (ref 3.80–5.20)
RDW: 14.5 % (ref 11.5–14.5)
WBC: 7.2 10*3/uL (ref 3.6–11.0)

## 2017-09-11 LAB — URINALYSIS, COMPLETE (UACMP) WITH MICROSCOPIC
Bacteria, UA: NONE SEEN
Bilirubin Urine: NEGATIVE
Glucose, UA: NEGATIVE mg/dL
Hgb urine dipstick: NEGATIVE
Ketones, ur: NEGATIVE mg/dL
Leukocytes, UA: NEGATIVE
Nitrite: NEGATIVE
Protein, ur: NEGATIVE mg/dL
Specific Gravity, Urine: 1.015 (ref 1.005–1.030)
pH: 7 (ref 5.0–8.0)

## 2017-09-11 LAB — TROPONIN I: Troponin I: <0.03 ng/mL (ref <0.03)

## 2017-09-11 MED ORDER — SODIUM CHLORIDE 0.9 % IV SOLN
Freq: Once | INTRAVENOUS | Status: AC
  Administered 2017-09-11: 17:00:00 via INTRAVENOUS

## 2017-09-11 MED ORDER — MECLIZINE HCL 25 MG PO TABS
25.0000 mg | ORAL_TABLET | Freq: Once | ORAL | Status: AC
  Administered 2017-09-11: 25 mg via ORAL
  Filled 2017-09-11: qty 1

## 2017-09-11 MED ORDER — MECLIZINE HCL 25 MG PO TABS: 25.0000 mg | ORAL_TABLET | Freq: Three times a day (TID) | ORAL | 0 refills | Status: DC | PRN

## 2017-09-11 NOTE — Telephone Encounter (Signed)
Pt called c/o dizziness and lightheadedness. Pt states that the dizziness started 3 weeks ago. Pt stated her dizziness is moderate to severe. Walking and bending over makes sx worse. HR was 76 bpm. Pt stated that she thinks her thyroid pill or fibromyalgia is causing her sx.  Pt stated that she had dizziness 6 weeks ago and when she gets tense form her fibromyalgia, it causes her to have a migraine.  Pt stated she has had diarrhea for the that ended yesterday  And nausea today. Pt states she drinks 6-8 glasses of water per day and does not feel like she is dehydrated. Care advice given per protocol. Pt verbalized understanding. Offered her appointment for tomorrow but pt refused. Pt stated that she cannot come until Friday. No availablity with PCP. Appt offered for Friday at 11:20 with Raelyn Ensign FNP.   Reason for Disposition . [1] MODERATE dizziness (e.g., interferes with normal activities) AND [2] has NOT been evaluated by physician for this  (Exception: dizziness caused by heat exposure, sudden standing, or poor fluid intake)    Pt refused appt with in 24 hours, Wants Friday appt  Answer Assessment - Initial Assessment Questions 1. DESCRIPTION: "Describe your dizziness."    Head gets dizzy- feels like she is going to pass out- leaning against the wall for support when she walk- right before eyes feels dizzy 2. LIGHTHEADED: "Do you feel lightheaded?" (e.g., somewhat faint, woozy, weak upon standing)     yes 3. VERTIGO: "Do you feel like either you or the room is spinning or tilting?" (i.e. vertigo)     no 4. SEVERITY: "How bad is it?"  "Do you feel like you are going to faint?" "Can you stand and walk?"   - MILD - walking normally   - MODERATE - interferes with normal activities (e.g., work, school)    - SEVERE - unable to stand, requires support to walk, feels like passing out now.      Moderate to severe 5. ONSET:  "When did the dizziness begin?"     3 weeks ago 6. AGGRAVATING FACTORS:  "Does anything make it worse?" (e.g., standing, change in head position)     Walking, bending over 7. HEART RATE: "Can you tell me your heart rate?" "How many beats in 15 seconds?"  (Note: not all patients can do this)       76 per minute 8. CAUSE: "What do you think is causing the dizziness?"     The new thyroid pill, h/o fibromyalgia 9. RECURRENT SYMPTOM: "Have you had dizziness before?" If so, ask: "When was the last time?" "What happened that time?"     Yes - 6 weeks ago- states she has pain fibromyalgia and gets tense and gets a migraine 10. OTHER SYMPTOMS: "Do you have any other symptoms?" (e.g., fever, chest pain, vomiting, diarrhea, bleeding)       Nauseated, had diarrhea for the last 3 days before today 11. PREGNANCY: "Is there any chance you are pregnant?" "When was your last menstrual period?"       n/a  Protocols used: DIZZINESS [facility]

## 2017-09-11 NOTE — ED Notes (Signed)
Pt taken to POV, in [place]. VSS. NAD. Discharge instructions, RX and follow up reviewed. All questions addressed.

## 2017-09-11 NOTE — ED Provider Notes (Signed)
[facility] Emergency Department Provider Note  ____________________________________________   I have reviewed the triage vital signs and the nursing notes.   HISTORY  Chief Complaint Dizziness  History limited by: Not Limited   HPI Jacqueline Lucas is a 68 y.o. female who presents to the emergency department today because of concern for dizziness.  She states that she has been dizzy for the past 3 weeks. The feelings come and go. Seem to get better when she lies down. She feels life is making it hard for her to walk and that she leans to the side when she tries to walk. The patient has had some associated headaches and does have a history of migraines but feels it has been a while since she last had one. She has had some chest pressure and sweats. Also has had diarrhea for the past few days. Denies similar symptoms in the past.    Per medical record review patient has a history of anginal pain  Past Medical History:  Diagnosis Date  . Allergy   . Allergy to adhesive tape    Allergy to paper tape as well  . Alzheimer disease   . Anemia   . Anginal pain ([place])   . Anxiety   . Arthritis   . Asthma   . Bronchitis   . Cataract   . Chronic nausea   . Claustrophobia   . DDD (degenerative disc disease), lumbar   . Depression   . Dry eyes    uses Restasis  . Fibromyalgia   . GERD (gastroesophageal reflux disease)   . H/O bladder infections   . Heart murmur   . Hematuria   . Hyperlipidemia   . Hypertension   . Hypothyroidism   . IBS (irritable bowel syndrome)   . Insomnia   . Lumbar neuritis   . Migraines   . Obesity   . Osteoporosis   . Palpitation   . PONV (postoperative nausea and vomiting)   . RLS (restless legs syndrome)   . Shoulder fracture, right   . Sicca ([place])   . Tachycardia   . Thyroid disease     Patient Active Problem List   Diagnosis Date Noted  . Hx of hysterectomy 08/13/2017  . Pain in left wrist 06/12/2017  . Chronic  venous insufficiency 06/09/2017  . Varicose veins of both lower extremities with pain 04/30/2017  . Claudication ([place]) 04/30/2017  . Arthritis of carpometacarpal [facility]) joint of left thumb 05/12/2016  . Bilateral thumb pain 05/01/2016  . Chronic bronchitis ([place]) 04/14/2016  . Anemia 02/03/2016  . Status post bilateral total hip replacement 01/10/2016  . Sicca ([place]) 01/10/2016  . Age related osteoporosis 01/10/2016  . Hip arthritis 08/16/2015  . Microscopic hematuria 07/30/2015  . Vaginal atrophy 07/30/2015  . GAD (generalized anxiety disorder) 07/08/2015  . Moderate episode of recurrent major depressive disorder ([place]) 07/08/2015  . Migraine with aura and without status migrainosus, not intractable 07/08/2015  . Dyslipidemia 07/08/2015  . Degenerative arthritis of right knee 03/16/2015  . Atrophic vaginitis 01/31/2015  . Recurrent UTI 12/31/2014  . IBS (irritable bowel syndrome) 10/29/2014  . Asthma, moderate persistent 09/08/2014  . Allergic state 09/07/2014  . Colon polyp 09/07/2014  . Hemorrhoid 09/07/2014  . Constrictive tenosynovitis 03/12/2014  . H/O total knee replacement, bilateral 07/31/2013  . CAD (coronary artery disease) 11/28/2012  . Anxiety 11/28/2012  . Acid reflux 11/28/2012  . Cardiac murmur 11/28/2012  . BP (high blood pressure) 11/28/2012  . Cannot  sleep 11/28/2012  . Adiposity 11/28/2012  . Arthritis, degenerative 11/28/2012  . Restless leg 11/28/2012  . Supraventricular tachycardia ([place]) 11/28/2012  . Fibromyalgia 11/28/2012    Past Surgical History:  Procedure Laterality Date  . ABDOMINAL HYSTERECTOMY     due to cancer of ovaries  . BACK SURGERY N/A 2014   x 5  . CARDIAC CATHETERIZATION     no PCI; 12/18/12 ([facility], Dr. Marcello Moores): EP study with ablation. No coronary angiography done then.  . COLONOSCOPY W/ POLYPECTOMY    . COLONOSCOPY WITH PROPOFOL N/A 02/04/2016   Procedure: COLONOSCOPY WITH PROPOFOL;  Surgeon: Robert Bellow, MD;  Location: [facility]  ENDOSCOPY;  Service: Endoscopy;  Laterality: N/A;  . ESOPHAGOGASTRODUODENOSCOPY (EGD) WITH PROPOFOL N/A 02/04/2016   Procedure: ESOPHAGOGASTRODUODENOSCOPY (EGD) WITH PROPOFOL;  Surgeon: Robert Bellow, MD;  Location: [facility] ENDOSCOPY;  Service: Endoscopy;  Laterality: N/A;  . FINGER ARTHROSCOPY WITH CARPOMETACARPEL ([place]) ARTHROPLASTY Left 05/01/2016   Dr. Erlinda Hong  . GIVENS CAPSULE STUDY N/A 05/02/2016   Procedure: GIVENS CAPSULE STUDY;  Surgeon: Manya Silvas, MD;  Location[facility] ENDOSCOPY;  Service: Endoscopy;  Laterality: N/A;  . HAND SURGERY    . HEMORRHOID SURGERY    . HIP ARTHROPLASTY    . JOINT REPLACEMENT Bilateral    knee  . KNEE ARTHROPLASTY    . REPLACEMENT TOTAL KNEE Left   . SPINE SURGERY     x 2  . tendonititis     right elbow, right wrist  . TOTAL HIP ARTHROPLASTY Left 2013  . TOTAL HIP ARTHROPLASTY Right 08/16/2015   Procedure: TOTAL HIP ARTHROPLASTY ANTERIOR APPROACH;  Surgeon: Meredith Pel, MD;  Location: [place];  Service: Orthopedics;  Laterality: Right;  . TOTAL KNEE ARTHROPLASTY Right 03/16/2015   Procedure: RIGHT TOTAL KNEE ARTHROPLASTY, PCL SACRIFICING;  Surgeon: Meredith Pel, MD;  Location: [place];  Service: Orthopedics;  Laterality: Right;    Prior to Admission medications   Medication Sig Start Date End Date Taking? Authorizing Provider  aspirin 81 MG chewable tablet Chew 81 mg by mouth daily.   Yes [provider]  atorvastatin (LIPITOR) 40 MG tablet Take 1 tablet (40 mg total) by mouth at bedtime. 05/31/17  Yes Sowles, Drue Stager, MD  baclofen (LIORESAL) 10 MG tablet TAKE ONE TABLET AT 7AM AND ONE TABLET AT 2PM AS NEEDED FOR MUSCLE SPASM 05/16/17  Yes Deveshwar, Abel Presto, MD  Cholecalciferol (D3-1000) 1000 units tablet Take 1,000 Units by mouth at bedtime.    Yes [provider]  divalproex (DEPAKOTE) 250 MG DR tablet Take 1 tablet (250 mg total) by mouth 2 (two) times daily. 07/03/17  Yes Sowles, Drue Stager, MD  estradiol (ESTRACE VAGINAL) 0.1  MG/GM vaginal cream Apply 0.5mg  (pea-sized amount)  just inside the vaginal introitus with a finger-tip every night for two weeks and then Monday, Wednesday and Friday nights. 11/29/16  Yes McGowan, Larene Beach A, PA-C  gabapentin (NEURONTIN) 300 MG capsule Take 3 capsules (900 mg total) by mouth 3 (three) times daily. 07/09/17  Yes Deveshwar, Abel Presto, MD  levalbuterol Penne Lash) 1.25 MG/3ML nebulizer solution Take 1.25 mg by nebulization every 4 (four) hours as needed for wheezing. 05/11/17  Yes Hubbard Hartshorn, FNP  levothyroxine (SYNTHROID) 88 MCG tablet Take 88 mcg by mouth daily before breakfast.  10/23/12  Yes [provider]  magnesium oxide (MAG-OX) 400 MG tablet Take 400 mg by mouth at bedtime. Reported on 07/28/2015   Yes [provider]  metoprolol tartrate (LOPRESSOR) 25 MG tablet TAKE 1 TABLET  BY MOUTH TWICE A DAY 07/11/17  Yes Sowles, Drue Stager, MD  montelukast (SINGULAIR) 10 MG tablet Take 1 tablet (10 mg total) by mouth at bedtime. 08/13/17  Yes Sowles, Drue Stager, MD  olmesartan-hydrochlorothiazide (BENICAR HCT) 40-12.5 MG tablet Take 1 tablet by mouth daily. 08/13/17  Yes Sowles, Drue Stager, MD  omega-3 acid ethyl esters (LOVAZA) 1 g capsule Take 2 capsules (2 g total) by mouth daily. 04/13/17  Yes Sowles, Drue Stager, MD  venlafaxine XR (EFFEXOR-XR) 150 MG 24 hr capsule Take 1 capsule (150 mg total) by mouth daily with breakfast. 08/13/17  Yes Sowles, Drue Stager, MD  vitamin B-12 (CYANOCOBALAMIN) 1000 MCG tablet Take 1,000 mcg by mouth at bedtime.   Yes [provider]  lansoprazole (PREVACID) 30 MG capsule Take 1 capsule (30 mg total) daily by mouth. Patient not taking: Reported on 09/11/2017 01/11/17 01/11/18  [name], MD  mometasone-formoterol (DULERA) 200-5 MCG/ACT AERO Inhale 2 puffs into the lungs 2 (two) times daily. Reported on 07/28/2015 Patient not taking: Reported on 09/11/2017 04/14/16   [name], MD  nystatin cream (MYCOSTATIN) APPLY TO CORNERS OF MOUTH 3 TIMES  DAILY AFTER EATING 08/23/17   [provider]  ondansetron (ZOFRAN) 4 MG tablet Take 1 tablet (4 mg total) by mouth every 8 (eight) hours as needed for nausea or vomiting. Patient not taking: Reported on 09/11/2017 04/14/16   [name], MD  polyethylene glycol powder [facility]) powder 255 grams one bottle for capsule endoscopy prep 04/05/16   Robert Bellow, MD    Allergies Morphine; Diazepam; Lyrica [pregabalin]; Metformin and related; Latex; Rash away  [petrolatum-zinc oxide]; Salicylates; and Tape  Family History  Problem Relation Age of Onset  . Diabetes Mother   . Hypertension Mother   . Heart disease Mother   . Alzheimer's disease Mother   . Cancer Father   . Diabetes Father   . Hypertension Sister   . Depression Sister   . Fibromyalgia Sister   . Cancer Brother   . Heart disease Brother   . Stroke Son   . Fibromyalgia Daughter   . Bladder Cancer Neg Hx   . Kidney disease Neg Hx   . Kidney cancer Neg Hx     Social History Social History   Tobacco Use  . Smoking status: Never Smoker  . Smokeless tobacco: Never Used  . Tobacco comment: smoking cessation materials not required  Substance Use Topics  . Alcohol use: No    Alcohol/week: 0.0 oz  . Drug use: No    Review of Systems Constitutional: No fever/chills Eyes: No visual changes. ENT: No sore throat. Cardiovascular: Positive for chest pressure. Respiratory: Denies shortness of breath. Gastrointestinal: Positive for diarrhea.  Genitourinary: Negative for dysuria. Musculoskeletal: Positive for right shoulder pain. Skin: Negative for rash. Neurological: Positive for headaches and dizziness.  ____________________________________________   PHYSICAL EXAM:  VITAL SIGNS: ED Triage Vitals  Enc Vitals Group     BP 09/11/17 1505 (!) 119/49     Pulse Rate 09/11/17 1505 (!) 51     Resp 09/11/17 1505 16     Temp 09/11/17 1505 97.9 F (36.6 C)     Temp Source 09/11/17 1505 Oral     SpO2  09/11/17 1505 98 %     Weight 09/11/17 1506 225 lb (102.1 kg)     Height 09/11/17 1506 5\' 5"  (1.651 m)     Head Circumference --      Peak Flow --      Pain Score 09/11/17 1506 0  Constitutional: Alert and oriented.  Eyes: Conjunctivae are normal.  ENT      Head: Normocephalic and atraumatic.      Nose: No congestion/rhinnorhea.      Mouth/Throat: Mucous membranes are moist.      Neck: No stridor. Hematological/Lymphatic/Immunilogical: No cervical lymphadenopathy. Cardiovascular: Normal rate, regular rhythm.  No murmurs, rubs, or gallops.  Respiratory: Normal respiratory effort without tachypnea nor retractions. Breath sounds are clear and equal bilaterally. No wheezes/rales/rhonchi. Gastrointestinal: Soft and non tender. No rebound. No guarding.  Genitourinary: Deferred Musculoskeletal: Normal range of motion in all extremities. No lower extremity edema. Neurologic:  Normal speech and language. No gross focal neurologic deficits are appreciated.  Skin:  Skin is warm, dry and intact. No rash noted. Psychiatric: Mood and affect are normal. Speech and behavior are normal. Patient exhibits appropriate insight and judgment.  ____________________________________________    LABS (pertinent positives/negatives)  CBC wbc 7.2, hgb 12.4, plt 211 BMP wnl except glu 161 UA not consistent with infection  ____________________________________________   EKG  I, Nance Pear, attending physician, personally viewed and interpreted this EKG  EKG Time: 1456 Rate: 61 Rhythm: sinus rhythm with pac Axis: normal Intervals: qtc 461 QRS: narrow ST changes: no st elevation Impression: abnormal ekg   ____________________________________________    RADIOLOGY  CT head No acute findings  ____________________________________________   PROCEDURES  Procedures  ____________________________________________   INITIAL IMPRESSION / ASSESSMENT AND PLAN / ED COURSE  Pertinent labs &  imaging results that were available during my care of the patient were reviewed by me and considered in my medical decision making (see chart for details).   Patient presented to the emergency department today because of dizziness.  Differential would be broad including anemia, dehydration, electrolyte abnormality, infection, intracranial mass, stroke amongst other etiologies.  Work-up without any obvious etiology.  After is discussing patient's work-up she states she did have vertigo in the past.  Did try meclizine which helped the patient's symptoms.  At this point given that she is feeling better will plan on deferring any further neuro imaging.  Discussed with patient importance of return for new symptoms and primary care follow-up.   ____________________________________________   FINAL CLINICAL IMPRESSION(S) / ED DIAGNOSES  Final diagnoses:  Dizziness  Vertigo     Note: This dictation was prepared with Dragon dictation. Any transcriptional errors that result from this process are unintentional     Nance Pear, MD 09/11/17 1924

## 2017-09-11 NOTE — ED Triage Notes (Signed)
Pt c/o chest tightness for the past 3 weeks, states today while walking she had sudden onset dizziness, I thought I was going to pass out. States she was diaphoretic with nausea. States she has had a flare up with her IBS, with diarrhea for the past 4 days. Pt is a/ox4 on arrival in NAD>

## 2017-09-11 NOTE — Discharge Instructions (Addendum)
Please seek medical attention for any high fevers, chest pain, shortness of breath, change in behavior, persistent vomiting, bloody stool or any other new or concerning symptoms.  

## 2017-09-11 NOTE — Telephone Encounter (Signed)
I contacted this patient to strongly encourage her to go to the nearest Urgent Care or ER and she said that she was actually on her way to the [facility] but will keep her appointment with Raquel Sarna on this Friday. She said that she appreciated our call and concern.

## 2017-09-11 NOTE — Telephone Encounter (Signed)
Please review and decide if patient needs to come in sooner or go to the nearest ER

## 2017-09-11 NOTE — Telephone Encounter (Signed)
She needs to go to [facility] and keep follow up with Raquel Sarna on Friday for [facility] follow up

## 2017-09-12 DIAGNOSIS — R29898 Other symptoms and signs involving the musculoskeletal system: Secondary | ICD-10-CM | POA: Diagnosis not present

## 2017-09-12 DIAGNOSIS — M48062 Spinal stenosis, lumbar region with neurogenic claudication: Secondary | ICD-10-CM | POA: Diagnosis not present

## 2017-09-14 ENCOUNTER — Encounter: Payer: Self-pay | Admitting: Family Medicine

## 2017-09-14 ENCOUNTER — Ambulatory Visit (INDEPENDENT_AMBULATORY_CARE_PROVIDER_SITE_OTHER): Payer: Medicare Other | Admitting: Family Medicine

## 2017-09-14 VITALS — BP 130/70 | HR 77 | Temp 97.9°F | Resp 16 | Ht 65.0 in | Wt 224.8 lb

## 2017-09-14 DIAGNOSIS — R42 Dizziness and giddiness: Secondary | ICD-10-CM | POA: Diagnosis not present

## 2017-09-14 MED ORDER — MECLIZINE HCL 25 MG PO TABS: 25.0000 mg | ORAL_TABLET | Freq: Three times a day (TID) | ORAL | 0 refills | Status: DC | PRN

## 2017-09-14 NOTE — Progress Notes (Signed)
Name: Jacqueline Lucas   MRN: [id number]    DOB: 05-30-1950   Date:09/14/2017       Progress Note  Subjective  Chief Complaint  Chief Complaint  Patient presents with  . Follow-up    ER for dizziness, chest tightness, sweating  . Dizziness    HPI  PT presents to follow up for vertigo - she was seen in [facility] first, she was so severely symptomatic that she was sent to the ER for further evaluation on 09/11/2017.  Symptoms started about 3 weeks ago and were intermittent, then on the 16th she   She is still having fatigue, dizziness. She does state diaphoresis is significantly improved.  She has history of vertigo, says this is a milder episode but still feels very similar.  Review of ER Visit: See lab results below; CT head was WNL with some chronic small vessel ischemic changes. UA was negative, troponin negative, BMP and CBC WNL.  Was Rx'd meclizine and this seems to help somewhat.   Patient Active Problem List   Diagnosis Date Noted  . Hx of hysterectomy 08/13/2017  . Pain in left wrist 06/12/2017  . Chronic venous insufficiency 06/09/2017  . Varicose veins of both lower extremities with pain 04/30/2017  . Claudication ([place]) 04/30/2017  . Arthritis of carpometacarpal [facility]) joint of left thumb 05/12/2016  . Bilateral thumb pain 05/01/2016  . Chronic bronchitis ([place]) 04/14/2016  . Anemia 02/03/2016  . Status post bilateral total hip replacement 01/10/2016  . Sicca ([place]) 01/10/2016  . Age related osteoporosis 01/10/2016  . Hip arthritis 08/16/2015  . Microscopic hematuria 07/30/2015  . Vaginal atrophy 07/30/2015  . GAD (generalized anxiety disorder) 07/08/2015  . Moderate episode of recurrent major depressive disorder ([place]) 07/08/2015  . Migraine with aura and without status migrainosus, not intractable 07/08/2015  . Dyslipidemia 07/08/2015  . Degenerative arthritis of right knee 03/16/2015  . Atrophic vaginitis 01/31/2015  . Recurrent UTI 12/31/2014  . IBS (irritable bowel  syndrome) 10/29/2014  . Asthma, moderate persistent 09/08/2014  . Allergic state 09/07/2014  . Colon polyp 09/07/2014  . Hemorrhoid 09/07/2014  . Constrictive tenosynovitis 03/12/2014  . H/O total knee replacement, bilateral 07/31/2013  . CAD (coronary artery disease) 11/28/2012  . Anxiety 11/28/2012  . Acid reflux 11/28/2012  . Cardiac murmur 11/28/2012  . BP (high blood pressure) 11/28/2012  . Cannot sleep 11/28/2012  . Adiposity 11/28/2012  . Arthritis, degenerative 11/28/2012  . Restless leg 11/28/2012  . Supraventricular tachycardia ([place]) 11/28/2012  . Fibromyalgia 11/28/2012    Social History   Tobacco Use  . Smoking status: Never Smoker  . Smokeless tobacco: Never Used  . Tobacco comment: smoking cessation materials not required  Substance Use Topics  . Alcohol use: No    Alcohol/week: 0.0 oz     Current Outpatient Medications:  .  aspirin 81 MG chewable tablet, Chew 81 mg by mouth daily., Disp: , Rfl:  .  atorvastatin (LIPITOR) 40 MG tablet, Take 1 tablet (40 mg total) by mouth at bedtime., Disp: 90 tablet, Rfl: 1 .  baclofen (LIORESAL) 10 MG tablet, TAKE ONE TABLET AT 7AM AND ONE TABLET AT 2PM AS NEEDED FOR MUSCLE SPASM, Disp: 180 tablet, Rfl: 1 .  Cholecalciferol (D3-1000) 1000 units tablet, Take 1,000 Units by mouth at bedtime. , Disp: , Rfl:  .  divalproex (DEPAKOTE) 250 MG DR tablet, Take 1 tablet (250 mg total) by mouth 2 (two) times daily., Disp: 180 tablet, Rfl: 1 .  estradiol (ESTRACE VAGINAL) 0.1  MG/GM vaginal cream, Apply 0.28m (pea-sized amount)  just inside the vaginal introitus with a finger-tip every night for two weeks and then Monday, Wednesday and Friday nights., Disp: 30 g, Rfl: 12 .  gabapentin (NEURONTIN) 300 MG capsule, Take 3 capsules (900 mg total) by mouth 3 (three) times daily., Disp: 810 capsule, Rfl: 0 .  levalbuterol (XOPENEX) 1.25 MG/3ML nebulizer solution, Take 1.25 mg by nebulization every 4 (four) hours as needed for wheezing., Disp: 72  mL, Rfl: 4 .  levothyroxine (SYNTHROID) 88 MCG tablet, Take 88 mcg by mouth daily before breakfast. , Disp: , Rfl:  .  magnesium oxide (MAG-OX) 400 MG tablet, Take 400 mg by mouth at bedtime. Reported on 07/28/2015, Disp: , Rfl:  .  meclizine (ANTIVERT) 25 MG tablet, Take 1 tablet (25 mg total) by mouth 3 (three) times daily as needed for dizziness., Disp: 20 tablet, Rfl: 0 .  metoprolol tartrate (LOPRESSOR) 25 MG tablet, TAKE 1 TABLET BY MOUTH TWICE A DAY, Disp: 180 tablet, Rfl: 1 .  mometasone-formoterol (DULERA) 200-5 MCG/ACT AERO, Inhale 2 puffs into the lungs 2 (two) times daily. Reported on 07/28/2015, Disp: 3 Inhaler, Rfl: 0 .  montelukast (SINGULAIR) 10 MG tablet, Take 1 tablet (10 mg total) by mouth at bedtime., Disp: 90 tablet, Rfl: 1 .  nystatin cream (MYCOSTATIN), APPLY TO CORNERS OF MOUTH 3 TIMES DAILY AFTER EATING, Disp: , Rfl: 0 .  olmesartan-hydrochlorothiazide (BENICAR HCT) 40-12.5 MG tablet, Take 1 tablet by mouth daily., Disp: 90 tablet, Rfl: 1 .  omega-3 acid ethyl esters (LOVAZA) 1 g capsule, Take 2 capsules (2 g total) by mouth daily., Disp: 360 capsule, Rfl: 1 .  polyethylene glycol powder (GLYCOLAX/MIRALAX) powder, 255 grams one bottle for capsule endoscopy prep, Disp: 255 g, Rfl: 0 .  venlafaxine XR (EFFEXOR-XR) 150 MG 24 hr capsule, Take 1 capsule (150 mg total) by mouth daily with breakfast., Disp: 90 capsule, Rfl: 1 .  vitamin B-12 (CYANOCOBALAMIN) 1000 MCG tablet, Take 1,000 mcg by mouth at bedtime., Disp: , Rfl:  .  lansoprazole (PREVACID) 30 MG capsule, Take 1 capsule (30 mg total) daily by mouth. (Patient not taking: Reported on 09/11/2017), Disp: 90 capsule, Rfl: 1 .  ondansetron (ZOFRAN) 4 MG tablet, Take 1 tablet (4 mg total) by mouth every 8 (eight) hours as needed for nausea or vomiting. (Patient not taking: Reported on 09/11/2017), Disp: 20 tablet, Rfl: 0  Allergies  Allergen Reactions  . Morphine Nausea And Vomiting    Ok with nausea medication  . Diazepam  Nausea And Vomiting  . Lyrica [Pregabalin] Other (See Comments)    Joint swelling  . Metformin And Related     diarrhea  . Latex Rash  . Rash Away  [Petrolatum-Zinc Oxide] Rash and Swelling  . Salicylates Other (See Comments)    Other reaction(s): Headache  . Tape Rash    Please use paper tape    Review of Systems  Constitutional: Positive for malaise/fatigue. Negative for chills and fever.  HENT: Positive for ear pain (RIGHT only). Negative for ear discharge, sinus pain and tinnitus.   Eyes: Positive for blurred vision (intermittent). Negative for discharge and redness.  Respiratory: Negative for shortness of breath and wheezing.   Cardiovascular: Negative for chest pain and palpitations.  Gastrointestinal: Positive for nausea. Negative for abdominal pain and vomiting.  Genitourinary: Negative for dysuria, flank pain, frequency and urgency.  Musculoskeletal: Negative for back pain and neck pain.  Skin: Negative for rash.  Neurological: Positive for dizziness and  headaches. Negative for sensory change, speech change and focal weakness.       Neg for confusion  All other systems reviewed and are negative.  Objective  Vitals:   09/14/17 1137  BP: 130/70  Pulse: 77  Resp: 16  Temp: 97.9 F (36.6 C)  TempSrc: Oral  SpO2: 94%  Weight: 224 lb 12.8 oz (102 kg)  Height: _0  (1.651 m)   Body mass index is 37.41 kg/m.  Nursing Note and Vital Signs reviewed.  Physical Exam  Constitutional: She is oriented to person, place, and time. She appears well-developed and well-nourished. No distress.  HENT:  Head: Normocephalic and atraumatic.  Right Ear: External ear normal.  Left Ear: External ear normal.  Nose: Nose normal.  Mouth/Throat: Oropharynx is clear and moist. No oropharyngeal exudate.  Eyes: Pupils are equal, round, and reactive to light. Conjunctivae and EOM are normal.  Neck: Normal range of motion. Neck supple. No JVD present.  Cardiovascular: Normal rate,  regular rhythm and normal heart sounds.  Pulmonary/Chest: Effort normal and breath sounds normal. She has no wheezes. She has no rales. She exhibits no tenderness.  Musculoskeletal: Normal range of motion. She exhibits no edema or tenderness.  Neurological: She is alert and oriented to person, place, and time. A sensory deficit (Baseline for patient to have some numbness/tingling in LUE) is present. No cranial nerve deficit. Gait abnormal.  Skin: Skin is warm and dry. No rash noted.  Psychiatric: She has a normal mood and affect. Her behavior is normal. Judgment and thought content normal.  Nursing note and vitals reviewed.   Results for orders placed or performed during the hospital encounter of 09/11/17 (from the past 72 hour(s))  Basic metabolic panel     Status: Abnormal   Collection Time: 09/11/17  3:09 PM  Result Value Ref Range   Sodium 138 135 - 145 mmol/L   Potassium 4.3 3.5 - 5.1 mmol/L   Chloride 102 98 - 111 mmol/L    Comment: Please note change in reference range.   CO2 27 22 - 32 mmol/L   Glucose, Bld 161 (H) 70 - 99 mg/dL    Comment: Please note change in reference range.   BUN 13 8 - 23 mg/dL    Comment: Please note change in reference range.   Creatinine, Ser 0.78 0.44 - 1.00 mg/dL   Calcium 8.9 8.9 - 10.3 mg/dL   GFR calc non Af Amer >60 >60 mL/min   GFR calc Af Amer >60 >60 mL/min    Comment: (NOTE) The eGFR has been calculated using the CKD EPI equation. This calculation has not been validated in all clinical situations. eGFR's persistently <60 mL/min signify possible Chronic Kidney Disease.    Anion gap 9 5 - 15    Comment: Performed at [facility], [address] [place], [place] [address]  CBC     Status: None   Collection Time: 09/11/17  3:09 PM  Result Value Ref Range   WBC 7.2 3.6 - 11.0 K/uL   RBC 3.88 3.80 - 5.20 MIL/uL   Hemoglobin 12.4 12.0 - 16.0 g/dL   HCT 36.3 35.0 - 47.0 %   MCV 93.4 80.0 - 100.0 fL   MCH 31.8 26.0 - 34.0 pg    MCHC 34.1 32.0 - 36.0 g/dL   RDW 14.5 11.5 - 14.5 %   Platelets 211 150 - 440 K/uL    Comment: Performed at [facility], [address] [place], [place] [address]  Urinalysis, Complete w Microscopic     Status: Abnormal   Collection Time: 09/11/17  3:09 PM  Result Value Ref Range   Color, Urine YELLOW (A) YELLOW   APPearance CLEAR (A) CLEAR   Specific Gravity, Urine 1.015 1.005 - 1.030   pH 7.0 5.0 - 8.0   Glucose, UA NEGATIVE NEGATIVE mg/dL   Hgb urine dipstick NEGATIVE NEGATIVE   Bilirubin Urine NEGATIVE NEGATIVE   Ketones, ur NEGATIVE NEGATIVE mg/dL   Protein, ur NEGATIVE NEGATIVE mg/dL   Nitrite NEGATIVE NEGATIVE   Leukocytes, UA NEGATIVE NEGATIVE   RBC / HPF 0-5 0 - 5 RBC/hpf   WBC, UA 0-5 0 - 5 WBC/hpf   Bacteria, UA NONE SEEN NONE SEEN   Squamous Epithelial / LPF 0-5 0 - 5   Mucus PRESENT     Comment: Performed at [facility], [address] [place], [place] [address]  Troponin I     Status: None   Collection Time: 09/11/17  3:09 PM  Result Value Ref Range   Troponin I <0.03 <0.03 ng/mL    Comment: Performed at [facility], [address], [place], [place] [address]    Assessment & Plan  1. Vertigo - meclizine (ANTIVERT) 25 MG tablet; Take 1 tablet (25 mg total) by mouth 3 (three) times daily as needed for dizziness.  Dispense: 30 tablet; Refill: 0 - Ambulatory referral to ENT - [facility] has balance/vertigo clinic - we have placed referral for her to see physician first for potential referral to this program. - Red flags and when to present for emergency care or RTC including fever >101.41F, chest pain, shortness of breath, new/worsening/un-resolving symptoms, signs/symptoms of stroke (reviewed with patient in detail) reviewed with patient at time of visit. Follow up and care instructions discussed and provided in AVS.

## 2017-09-17 DIAGNOSIS — R42 Dizziness and giddiness: Secondary | ICD-10-CM | POA: Diagnosis not present

## 2017-09-19 ENCOUNTER — Encounter (INDEPENDENT_AMBULATORY_CARE_PROVIDER_SITE_OTHER): Payer: Self-pay | Admitting: Orthopedic Surgery

## 2017-09-19 ENCOUNTER — Ambulatory Visit (INDEPENDENT_AMBULATORY_CARE_PROVIDER_SITE_OTHER): Payer: Medicare Other

## 2017-09-19 ENCOUNTER — Ambulatory Visit (INDEPENDENT_AMBULATORY_CARE_PROVIDER_SITE_OTHER): Payer: Medicare Other | Admitting: Orthopedic Surgery

## 2017-09-19 DIAGNOSIS — I209 Angina pectoris, unspecified: Secondary | ICD-10-CM | POA: Diagnosis not present

## 2017-09-19 DIAGNOSIS — M7540 Impingement syndrome of unspecified shoulder: Secondary | ICD-10-CM

## 2017-09-19 DIAGNOSIS — M25522 Pain in left elbow: Secondary | ICD-10-CM

## 2017-09-21 ENCOUNTER — Encounter (INDEPENDENT_AMBULATORY_CARE_PROVIDER_SITE_OTHER): Payer: Self-pay | Admitting: Orthopedic Surgery

## 2017-09-21 DIAGNOSIS — R42 Dizziness and giddiness: Secondary | ICD-10-CM | POA: Diagnosis not present

## 2017-09-21 NOTE — Progress Notes (Signed)
Office Visit Note   Patient: Jacqueline Lucas           Date of Birth: [birth year]           MRN: [id number] Visit Date: 09/19/2017 Requested by: [name], [address] [place], [place] [address] PCP: [name], MD  Subjective: Chief Complaint  Patient presents with  . Shoulder Pain    bilateral-request injections    HPI: [name] is a patient with bilateral shoulder pain.  She has known rotator cuff pathology which is mild and she is requesting bilateral shoulder injections today.  They have helped her in the past.  She also reports some left elbow pain.  She describes a mass just distal to the elbow flexion crease which is painful.  Denies any history of trauma to that elbow.  Denies any numbness and tingling in the left hand.              ROS: All systems reviewed are negative as they relate to the chief complaint within the history of present illness.  Patient denies  fevers or chills.   Assessment & Plan: Visit Diagnoses:  1. Pain in left elbow   2. Impingement syndrome of shoulder region, unspecified laterality     Plan: Impression is bilateral shoulder impingement and rotator cuff pathology which is mildly symptomatic.  Both shoulders are injected today in the subacromial space.  In regards to that left elbow plain radiographs are normal but she does have a tender mass which feels like possible lipoma but has some characteristics which are slightly dissimilar to lipoma.  Patient has tenderness to palpation as well as relative immobility of this mass.  Plan MRI with contrast left elbow to evaluate this atypical lipoma  Follow-Up Instructions: Return for after MRI.   Orders:  Orders Placed This Encounter  Procedures  . XR Elbow 2 Views Left  . MR Elbow Left w/ contrast   No orders of the defined types were placed in this encounter.     Procedures: No procedures performed   Clinical Data: No additional findings.  Objective: Vital Signs:  There were no vitals taken for this visit.  Physical Exam:   Constitutional: Patient appears well-developed HEENT:  Head: Normocephalic Eyes:EOM are normal Neck: Normal range of motion Cardiovascular: Normal rate Pulmonary/chest: Effort normal Neurologic: Patient is alert Skin: Skin is warm Psychiatric: Patient has normal mood and affect    Ortho Exam: Ortho exam demonstrates good cervical spine range of motion.  Patient has positive impingement signs bilaterally but no discrete rotator cuff weakness to infraspinatus supraspinatus and subscap muscle testing.  Radial pulses intact bilaterally.  No other masses lymph adenopathy or skin changes noted in the shoulder girdle region.  Left elbow demonstrates good elbow flexion and extension strength with palpable biceps tendon.  Good supination strength bilaterally.  Intact EPL FPL interosseous function of the left hand with palpable radial pulse and no proximal lymphadenopathy.  Patient has about a 5 cm fairly nonmobile mass just anterior to the common flexor origin.  It is tender to palpation.  Negative Tinel's in the cubital tunnel.  Specialty Comments:  No specialty comments available.  Imaging: No results found.   PMFS History: Patient Active Problem List   Diagnosis Date Noted  . Hx of hysterectomy 08/13/2017  . Pain in left wrist 06/12/2017  . Chronic venous insufficiency 06/09/2017  . Varicose veins of both lower extremities with pain 04/30/2017  . Claudication ([place]) 04/30/2017  .  Arthritis of carpometacarpal [facility]) joint of left thumb 05/12/2016  . Bilateral thumb pain 05/01/2016  . Chronic bronchitis ([place]) 04/14/2016  . Anemia 02/03/2016  . Status post bilateral total hip replacement 01/10/2016  . Sicca ([place]) 01/10/2016  . Age related osteoporosis 01/10/2016  . Hip arthritis 08/16/2015  . Microscopic hematuria 07/30/2015  . Vaginal atrophy 07/30/2015  . GAD (generalized anxiety disorder) 07/08/2015  . Moderate episode  of recurrent major depressive disorder ([place]) 07/08/2015  . Migraine with aura and without status migrainosus, not intractable 07/08/2015  . Dyslipidemia 07/08/2015  . Degenerative arthritis of right knee 03/16/2015  . Atrophic vaginitis 01/31/2015  . Recurrent UTI 12/31/2014  . IBS (irritable bowel syndrome) 10/29/2014  . Asthma, moderate persistent 09/08/2014  . Allergic state 09/07/2014  . Colon polyp 09/07/2014  . Hemorrhoid 09/07/2014  . Constrictive tenosynovitis 03/12/2014  . H/O total knee replacement, bilateral 07/31/2013  . CAD (coronary artery disease) 11/28/2012  . Anxiety 11/28/2012  . Acid reflux 11/28/2012  . Cardiac murmur 11/28/2012  . BP (high blood pressure) 11/28/2012  . Cannot sleep 11/28/2012  . Adiposity 11/28/2012  . Arthritis, degenerative 11/28/2012  . Restless leg 11/28/2012  . Supraventricular tachycardia ([place]) 11/28/2012  . Fibromyalgia 11/28/2012   Past Medical History:  Diagnosis Date  . Allergy   . Allergy to adhesive tape    Allergy to paper tape as well  . Alzheimer disease   . Anemia   . Anginal pain ([place])   . Anxiety   . Arthritis   . Asthma   . Bronchitis   . Cataract   . Chronic nausea   . Claustrophobia   . DDD (degenerative disc disease), lumbar   . Depression   . Dry eyes    uses Restasis  . Fibromyalgia   . GERD (gastroesophageal reflux disease)   . H/O bladder infections   . Heart murmur   . Hematuria   . Hyperlipidemia   . Hypertension   . Hypothyroidism   . IBS (irritable bowel syndrome)   . Insomnia   . Lumbar neuritis   . Migraines   . Obesity   . Osteoporosis   . Palpitation   . PONV (postoperative nausea and vomiting)   . RLS (restless legs syndrome)   . Shoulder fracture, right   . Sicca ([place])   . Tachycardia   . Thyroid disease     Family History  Problem Relation Age of Onset  . Diabetes Mother   . Hypertension Mother   . Heart disease Mother   . Alzheimer's disease Mother   . Cancer Father   .  Diabetes Father   . Hypertension Sister   . Depression Sister   . Fibromyalgia Sister   . Cancer Brother   . Heart disease Brother   . Stroke Son   . Fibromyalgia Daughter   . Bladder Cancer Neg Hx   . Kidney disease Neg Hx   . Kidney cancer Neg Hx     Past Surgical History:  Procedure Laterality Date  . ABDOMINAL HYSTERECTOMY     due to cancer of ovaries  . BACK SURGERY N/A 2014   x 5  . CARDIAC CATHETERIZATION     no PCI; 12/18/12 ([facility], Dr. Marcello Moores): EP study with ablation. No coronary angiography done then.  . COLONOSCOPY W/ POLYPECTOMY    . COLONOSCOPY WITH PROPOFOL N/A 02/04/2016   Procedure: COLONOSCOPY WITH PROPOFOL;  Surgeon: Robert Bellow, MD;  Location[facility] END[facility]COPY;  Service: Endoscopy;  Laterality: N/A;  .  ESOPHAGOGASTRODUODENOSCOPY (EGD) WITH PROPOFOL N/A 02/04/2016   Procedure: ESOPHAGOGASTRODUODENOSCOPY (EGD) WITH PROPOFOL;  Surgeon: Robert Bellow, MD;  Location: [facility] ENDOSCOPY;  Service: Endoscopy;  Laterality: N/A;  . FINGER ARTHROSCOPY WITH CARPOMETACARPEL ([place]) ARTHROPLASTY Left 05/01/2016   Dr. Erlinda Hong  . GIVENS CAPSULE STUDY N/A 05/02/2016   Procedure: GIVENS CAPSULE STUDY;  Surgeon: Manya Silvas, MD;  Location: [facility] ENDOSCOPY;  Service: Endoscopy;  Laterality: N/A;  . HAND SURGERY    . HEMORRHOID SURGERY    . HIP ARTHROPLASTY    . JOINT REPLACEMENT Bilateral    knee  . KNEE ARTHROPLASTY    . REPLACEMENT TOTAL KNEE Left   . SPINE SURGERY     x 2  . tendonititis     right elbow, right wrist  . TOTAL HIP ARTHROPLASTY Left 2013  . TOTAL HIP ARTHROPLASTY Right 08/16/2015   Procedure: TOTAL HIP ARTHROPLASTY ANTERIOR APPROACH;  Surgeon: Meredith Pel, MD;  Location: [facility];  Service: Orthopedics;  Laterality: Right;  . TOTAL KNEE ARTHROPLASTY Right 03/16/2015   Procedure: RIGHT TOTAL KNEE ARTHROPLASTY, PCL SACRIFICING;  Surgeon: Meredith Pel, MD;  Location: [facility];  Service: Orthopedics;  Laterality: Right;   Social History   Occupational  History  . Occupation: Retired  Tobacco Use  . Smoking status: Never Smoker  . Smokeless tobacco: Never Used  . Tobacco comment: smoking cessation materials not required  Substance and Sexual Activity  . Alcohol use: No    Alcohol/week: 0.0 oz  . Drug use: No  . Sexual activity: Never

## 2017-09-25 ENCOUNTER — Ambulatory Visit
Admission: RE | Admit: 2017-09-25 | Discharge: 2017-09-25 | Disposition: A | Discharge status: Home / Self Care | Payer: Medicare Other | Source: Ambulatory Visit | Attending: Family Medicine | Admitting: Family Medicine

## 2017-09-25 DIAGNOSIS — M810 Age-related osteoporosis without current pathological fracture: Secondary | ICD-10-CM | POA: Insufficient documentation

## 2017-09-25 DIAGNOSIS — Z780 Asymptomatic menopausal state: Secondary | ICD-10-CM

## 2017-09-25 DIAGNOSIS — Z1231 Encounter for screening mammogram for malignant neoplasm of breast: Secondary | ICD-10-CM | POA: Diagnosis not present

## 2017-09-25 DIAGNOSIS — Z13820 Encounter for screening for osteoporosis: Secondary | ICD-10-CM | POA: Diagnosis not present

## 2017-10-01 DIAGNOSIS — R42 Dizziness and giddiness: Secondary | ICD-10-CM | POA: Diagnosis not present

## 2017-10-01 DIAGNOSIS — T50905A Adverse effect of unspecified drugs, medicaments and biological substances, initial encounter: Secondary | ICD-10-CM | POA: Diagnosis not present

## 2017-10-03 ENCOUNTER — Other Ambulatory Visit: Payer: Self-pay | Admitting: Rheumatology

## 2017-10-03 ENCOUNTER — Other Ambulatory Visit (INDEPENDENT_AMBULATORY_CARE_PROVIDER_SITE_OTHER): Payer: Self-pay | Admitting: Orthopedic Surgery

## 2017-10-03 NOTE — Telephone Encounter (Signed)
Last Visit: 05/15/17 Next Visit: 11/20/17  Okay to refill per Dr. Estanislado Pandy

## 2017-10-08 ENCOUNTER — Encounter: Payer: Self-pay | Admitting: Nurse Practitioner

## 2017-10-08 ENCOUNTER — Ambulatory Visit (INDEPENDENT_AMBULATORY_CARE_PROVIDER_SITE_OTHER): Payer: Medicare Other | Admitting: Nurse Practitioner

## 2017-10-08 VITALS — BP 122/62 | HR 80 | Temp 98.0°F | Resp 16 | Ht 64.0 in | Wt 218.9 lb

## 2017-10-08 DIAGNOSIS — R059 Cough, unspecified: Secondary | ICD-10-CM

## 2017-10-08 DIAGNOSIS — J069 Acute upper respiratory infection, unspecified: Secondary | ICD-10-CM

## 2017-10-08 DIAGNOSIS — R05 Cough: Secondary | ICD-10-CM | POA: Diagnosis not present

## 2017-10-08 MED ORDER — PROMETHAZINE-DM 6.25-15 MG/5ML PO SYRP: 1.2500 mL | ORAL_SOLUTION | Freq: Four times a day (QID) | ORAL | 0 refills | Status: DC | PRN

## 2017-10-08 NOTE — Progress Notes (Addendum)
Name: Jacqueline Lucas   MRN: [id number]    DOB: 11-02-1950   Date:10/08/2017       Progress Note  Subjective  Chief Complaint  Chief Complaint  Patient presents with  . URI    Onset 2 days ago, symptoms include cough, sore throat, left ear pain, drainage.    HPI  Symptoms started Saturday with a sore throat then had nasal drainage, and yellow-green sputum, left ear pain, states cough is very strong and starting to cause rib pain. Endorses headaches.,  Denies fevers, chills, body aches, abdominal pain, nausea, vomiting, muffled sounds or ear drainage.  Has tried dayquil and nyquil with minimal relief of symptoms.   Patient Active Problem List   Diagnosis Date Noted  . Hx of hysterectomy 08/13/2017  . Pain in left wrist 06/12/2017  . Chronic venous insufficiency 06/09/2017  . Varicose veins of both lower extremities with pain 04/30/2017  . Claudication ([place]) 04/30/2017  . Arthritis of carpometacarpal [facility]) joint of left thumb 05/12/2016  . Bilateral thumb pain 05/01/2016  . Chronic bronchitis ([place]) 04/14/2016  . Anemia 02/03/2016  . Status post bilateral total hip replacement 01/10/2016  . Sicca ([place]) 01/10/2016  . Age related osteoporosis 01/10/2016  . Hip arthritis 08/16/2015  . Microscopic hematuria 07/30/2015  . Vaginal atrophy 07/30/2015  . GAD (generalized anxiety disorder) 07/08/2015  . Moderate episode of recurrent major depressive disorder ([place]) 07/08/2015  . Migraine with aura and without status migrainosus, not intractable 07/08/2015  . Dyslipidemia 07/08/2015  . Degenerative arthritis of right knee 03/16/2015  . Atrophic vaginitis 01/31/2015  . Recurrent UTI 12/31/2014  . IBS (irritable bowel syndrome) 10/29/2014  . Asthma, moderate persistent 09/08/2014  . Allergic state 09/07/2014  . Colon polyp 09/07/2014  . Hemorrhoid 09/07/2014  . Constrictive tenosynovitis 03/12/2014  . H/O total knee replacement, bilateral 07/31/2013  . CAD (coronary artery disease)  11/28/2012  . Anxiety 11/28/2012  . Acid reflux 11/28/2012  . Cardiac murmur 11/28/2012  . BP (high blood pressure) 11/28/2012  . Cannot sleep 11/28/2012  . Adiposity 11/28/2012  . Arthritis, degenerative 11/28/2012  . Restless leg 11/28/2012  . Supraventricular tachycardia ([place]) 11/28/2012  . Fibromyalgia 11/28/2012  . Tachycardia 11/28/2012    Past Medical History:  Diagnosis Date  . Allergy   . Allergy to adhesive tape    Allergy to paper tape as well  . Alzheimer disease   . Anemia   . Anginal pain ([place])   . Anxiety   . Arthritis   . Asthma   . Bronchitis   . Cataract   . Chronic nausea   . Claustrophobia   . DDD (degenerative disc disease), lumbar   . Depression   . Dry eyes    uses Restasis  . Fibromyalgia   . GERD (gastroesophageal reflux disease)   . H/O bladder infections   . Heart murmur   . Hematuria   . Hyperlipidemia   . Hypertension   . Hypothyroidism   . IBS (irritable bowel syndrome)   . Insomnia   . Lumbar neuritis   . Migraines   . Obesity   . Osteoporosis   . Palpitation   . PONV (postoperative nausea and vomiting)   . RLS (restless legs syndrome)   . Shoulder fracture, right   . Sicca ([place])   . Tachycardia   . Thyroid disease     Past Surgical History:  Procedure Laterality Date  . ABDOMINAL HYSTERECTOMY     due to cancer of ovaries  .  BACK SURGERY N/A 2014   x 5  . CARDIAC CATHETERIZATION     no PCI; 12/18/12 ([facility], Dr. Marcello Moores): EP study with ablation. No coronary angiography done then.  . COLONOSCOPY W/ POLYPECTOMY    . COLONOSCOPY WITH PROPOFOL N/A 02/04/2016   Procedure: COLONOSCOPY WITH PROPOFOL;  Surgeon: Robert Bellow, MD;  Location: [facility] ENDOSCOPY;  Service: Endoscopy;  Laterality: N/A;  . ESOPHAGOGASTRODUODENOSCOPY (EGD) WITH PROPOFOL N/A 02/04/2016   Procedure: ESOPHAGOGASTRODUODENOSCOPY (EGD) WITH PROPOFOL;  Surgeon: Robert Bellow, MD;  Location: [facility] ENDOSCOPY;  Service: Endoscopy;  Laterality: N/A;  . FINGER  ARTHROSCOPY WITH CARPOMETACARPEL ([place]) ARTHROPLASTY Left 05/01/2016   Dr. Erlinda Hong  . GIVENS CAPSULE STUDY N/A 05/02/2016   Procedure: GIVENS CAPSULE STUDY;  Surgeon: Manya Silvas, MD;  Location[facility] ENDOSCOPY;  Service: Endoscopy;  Laterality: N/A;  . HAND SURGERY    . HEMORRHOID SURGERY    . HIP ARTHROPLASTY    . JOINT REPLACEMENT Bilateral    knee  . KNEE ARTHROPLASTY    . REPLACEMENT TOTAL KNEE Left   . SPINE SURGERY     x 2  . tendonititis     right elbow, right wrist  . TOTAL HIP ARTHROPLASTY Left 2013  . TOTAL HIP ARTHROPLASTY Right 08/16/2015   Procedure: TOTAL HIP ARTHROPLASTY ANTERIOR APPROACH;  Surgeon: Meredith Pel, MD;  Location: [place];  Service: Orthopedics;  Laterality: Right;  . TOTAL KNEE ARTHROPLASTY Right 03/16/2015   Procedure: RIGHT TOTAL KNEE ARTHROPLASTY, PCL SACRIFICING;  Surgeon: Meredith Pel, MD;  Location: [place];  Service: Orthopedics;  Laterality: Right;    Social History   Tobacco Use  . Smoking status: Never Smoker  . Smokeless tobacco: Never Used  . Tobacco comment: smoking cessation materials not required  Substance Use Topics  . Alcohol use: No    Alcohol/week: 0.0 standard drinks     Current Outpatient Medications:  .  aspirin 81 MG chewable tablet, Chew 81 mg by mouth daily., Disp: , Rfl:  .  atorvastatin (LIPITOR) 40 MG tablet, Take 1 tablet (40 mg total) by mouth at bedtime., Disp: 90 tablet, Rfl: 1 .  Cholecalciferol (D3-1000) 1000 units tablet, Take 1,000 Units by mouth at bedtime. , Disp: , Rfl:  .  divalproex (DEPAKOTE) 250 MG DR tablet, Take 1 tablet (250 mg total) by mouth 2 (two) times daily., Disp: 180 tablet, Rfl: 1 .  estradiol (ESTRACE VAGINAL) 0.1 MG/GM vaginal cream, Apply 0.5mg  (pea-sized amount)  just inside the vaginal introitus with a finger-tip every night for two weeks and then Monday, Wednesday and Friday nights., Disp: 30 g, Rfl: 12 .  gabapentin (NEURONTIN) 300 MG capsule, TAKE 3 CAPSULES (=900MG ) 3 TIMES A DAY,  Disp: 810 capsule, Rfl: 0 .  lansoprazole (PREVACID) 30 MG capsule, Take 1 capsule (30 mg total) daily by mouth., Disp: 90 capsule, Rfl: 1 .  levalbuterol (XOPENEX) 1.25 MG/3ML nebulizer solution, Take 1.25 mg by nebulization every 4 (four) hours as needed for wheezing., Disp: 72 mL, Rfl: 4 .  levothyroxine (SYNTHROID, LEVOTHROID) 50 MCG tablet, Take 50 mcg by mouth daily before breakfast., Disp: , Rfl:  .  magnesium oxide (MAG-OX) 400 MG tablet, Take 400 mg by mouth at bedtime. Reported on 07/28/2015, Disp: , Rfl:  .  metoprolol tartrate (LOPRESSOR) 25 MG tablet, TAKE 1 TABLET BY MOUTH TWICE A DAY, Disp: 180 tablet, Rfl: 1 .  mometasone-formoterol (DULERA) 200-5 MCG/ACT AERO, Inhale 2 puffs into the lungs 2 (two) times daily. Reported on 07/28/2015,  Disp: 3 Inhaler, Rfl: 0 .  montelukast (SINGULAIR) 10 MG tablet, Take 1 tablet (10 mg total) by mouth at bedtime., Disp: 90 tablet, Rfl: 1 .  nystatin cream (MYCOSTATIN), APPLY TO CORNERS OF MOUTH 3 TIMES DAILY AFTER EATING, Disp: , Rfl: 0 .  olmesartan-hydrochlorothiazide (BENICAR HCT) 40-12.5 MG tablet, Take 1 tablet by mouth daily., Disp: 90 tablet, Rfl: 1 .  omega-3 acid ethyl esters (LOVAZA) 1 g capsule, Take 2 capsules (2 g total) by mouth daily., Disp: 360 capsule, Rfl: 1 .  ondansetron (ZOFRAN) 4 MG tablet, Take 1 tablet (4 mg total) by mouth every 8 (eight) hours as needed for nausea or vomiting., Disp: 20 tablet, Rfl: 0 .  polyethylene glycol powder (GLYCOLAX/MIRALAX) powder, 255 grams one bottle for capsule endoscopy prep, Disp: 255 g, Rfl: 0 .  vitamin B-12 (CYANOCOBALAMIN) 1000 MCG tablet, Take 1,000 mcg by mouth at bedtime., Disp: , Rfl:  .  levothyroxine (SYNTHROID) 88 MCG tablet, Take 88 mcg by mouth daily before breakfast. , Disp: , Rfl:  .  promethazine-dextromethorphan (PROMETHAZINE-DM) 6.25-15 MG/5ML syrup, Take 1.3 mLs by mouth 4 (four) times daily as needed for cough., Disp: 118 mL, Rfl: 0  Allergies  Allergen Reactions  . Morphine  Nausea And Vomiting    Ok with nausea medication  . Diazepam Nausea And Vomiting  . Lyrica [Pregabalin] Other (See Comments)    Joint swelling  . Metformin And Related     diarrhea  . Latex Rash  . Rash Away  [Petrolatum-Zinc Oxide] Rash and Swelling  . Salicylates Other (See Comments)    Other reaction(s): Headache  . Tape Rash    Please use paper tape    ROS  No other specific complaints in a complete review of systems (except as listed in HPI above).  Objective  Vitals:   10/08/17 1422  BP: 122/62  Pulse: 80  Resp: 16  Temp: 98 F (36.7 C)  SpO2: 97%  Weight: 218 lb 14.4 oz (99.3 kg)  Height: 5\' 4"  (1.626 m)    Body mass index is 37.57 kg/m.  Nursing Note and Vital Signs reviewed.  Physical Exam  Constitutional: She is oriented to person, place, and time. She appears well-developed and well-nourished.  HENT:  Head: Normocephalic and atraumatic.  Right Ear: Hearing, tympanic membrane, external ear and ear canal normal.  Left Ear: Hearing, tympanic membrane, external ear and ear canal normal.  Nose: Right sinus exhibits maxillary sinus tenderness. Right sinus exhibits no frontal sinus tenderness. Left sinus exhibits maxillary sinus tenderness. Left sinus exhibits no frontal sinus tenderness.  Mouth/Throat: Oropharynx is clear and moist and mucous membranes are normal. No oropharyngeal exudate.  Eyes: Pupils are equal, round, and reactive to light. Conjunctivae and EOM are normal. Right eye exhibits no discharge. Left eye exhibits no discharge.  Neck: Normal range of motion. Neck supple.  Cardiovascular: Normal rate, regular rhythm, normal heart sounds and intact distal pulses.  Pulmonary/Chest: Effort normal and breath sounds normal.  Abdominal: Soft. Bowel sounds are normal.  Musculoskeletal: Normal range of motion.  Lymphadenopathy:    She has no cervical adenopathy.  Neurological: She is alert and oriented to person, place, and time.  Skin: Skin is warm and  dry. No rash noted.  Psychiatric: She has a normal mood and affect. Her behavior is normal. Judgment and thought content normal.    No results found for this or any previous visit (from the past 48 hour(s)).  Assessment & Plan  1. Cough - Discussed  other OTC treatments, sts cannot take musinex.  - promethazine-dextromethorphan (PROMETHAZINE-DM) 6.25-15 MG/5ML syrup; Take 1.3 mLs by mouth 4 (four) times daily as needed for cough.  Dispense: 118 mL; Refill: 0  2. Upper respiratory tract infection, unspecified type - Rest, fluids, proper nutrition. Discussed antibiotic stewardship- and OTC treatments; If significant worsening  or not improving around day 7-10 will call back.  Discussed risk for secondary bacterial infections and increased risk for lower respiratory infections due to asthma and chronic bronchitis.  - promethazine-dextromethorphan (PROMETHAZINE-DM) 6.25-15 MG/5ML syrup; Take 1.3 mLs by mouth 4 (four) times daily as needed for cough.  Dispense: 118 mL; Refill: 0  - Sending cough medicine to the pharmacy; starting at low dose but can take up to 29mL at bedtime if needed.  - Drink plenty of water and rest - Use flonase as needed - take a daily antihistamine- like Claritin or zyrtec if your are not taking benadryl.  - If you feel worse in 1-2 days or do not feel better in a week please call us to let us know.  -Red flags and when to present for emergency care or RTC including fever >101.21F, chest pain, shortness of breath, new/worsening/un-resolving symptoms, reviewed with patient at time of visit. Follow up and care instructions discussed and provided in AVS.  ------------------------------------ I have reviewed this encounter including the documentation in this note and/or discussed this patient with the provider, Suezanne Cheshire DNP AGNP-C. I am certifying that I agree with the content of this note as supervising physician. Enid Derry, [facility] 10/17/2017, 4:25 PM

## 2017-10-08 NOTE — Patient Instructions (Addendum)
-   Sending cough medicine to the pharmacy; starting at low dose but can take up to 52mL at bedtime if needed.  - Drink plenty of water and rest - Use flonase as needed - take a daily antihistamine- like Claritin or zyrtec if your are not taking benadryl.  - If you feel worse in 1-2 days or do not feel better in a week please call us to let us know.   You likely have a viral upper respiratory infection (URI). Antibiotics will not reduce the number of days you are ill or prevent you from getting bacterial rhinosinusitis. A URI can take up to 14 days to resolve, but typically last between 7-11 days. Your body is so smart and strong that it will be fighting this illness off for you but it is important that you drink plenty of fluids, rest. Cover your nose/mouth when you cough or sneeze and wash your hands well and often. Here are some helpful things you can use or pick up over the counter from the pharmacy to help with your symptoms:   For Fever/Pain: Acetaminophen every 6 hours as needed (maximum of 3000mg  a day). If you are still uncomfortable you can add ibuprofen OR naproxen  For coughing: try dextromethorphan for a cough suppressant, and/or a cool mist humidifier, lozenges  For sore throat: saline gargles, honey herbal tea, lozenges, throat spray  To dry out your nose: try an antihistamine like loratadine (non-sedating) or diphenhydramine (sedating) or others To relieve a stuffy nose: try an oral decongestant  Like pseudoephedrine if you are under the age of 74 and do not have high blood pressure, neti pot To make blowing your nose easier: guaifenesin

## 2017-10-09 ENCOUNTER — Other Ambulatory Visit: Payer: Self-pay | Admitting: Family Medicine

## 2017-10-09 MED ORDER — VENLAFAXINE HCL ER 150 MG PO CP24: 150.0000 mg | ORAL_CAPSULE | Freq: Every day | ORAL | 1 refills | Status: DC

## 2017-10-09 NOTE — Telephone Encounter (Signed)
Copied from [place] [id number]. Topic: Quick Communication - Rx Refill/Question >> Oct 09, 2017  4:03 PM Mcneil, Ja-Kwan wrote: Medication: venlafaxine XR (EFFEXOR-XR) 150 MG 24 hr capsule   Preferred Pharmacy (with phone number or street name): [id number] - [place], [place] [phone] (Phone) [phone] (Fax)   Agent: Please be advised that RX refills may take up to 3 business days. We ask that you follow-up with your pharmacy.

## 2017-10-12 ENCOUNTER — Other Ambulatory Visit: Payer: Self-pay | Admitting: Urology

## 2017-10-15 ENCOUNTER — Other Ambulatory Visit: Payer: Self-pay | Admitting: Nurse Practitioner

## 2017-10-15 ENCOUNTER — Encounter (INDEPENDENT_AMBULATORY_CARE_PROVIDER_SITE_OTHER): Payer: Self-pay | Admitting: Vascular Surgery

## 2017-10-15 ENCOUNTER — Ambulatory Visit (INDEPENDENT_AMBULATORY_CARE_PROVIDER_SITE_OTHER): Payer: Medicare Other | Admitting: Vascular Surgery

## 2017-10-15 VITALS — BP 134/69 | HR 67 | Resp 16 | Ht 64.0 in | Wt 219.0 lb

## 2017-10-15 DIAGNOSIS — I83813 Varicose veins of bilateral lower extremities with pain: Secondary | ICD-10-CM

## 2017-10-15 DIAGNOSIS — I83812 Varicose veins of left lower extremities with pain: Secondary | ICD-10-CM | POA: Diagnosis not present

## 2017-10-15 NOTE — Progress Notes (Signed)
Patient reports that she is taking 50 mcg. She is seeing a different doctor and it was changed from 88 mcg to 50 mcg.

## 2017-10-15 NOTE — Progress Notes (Signed)
Jacqueline Lucas is a 67 y.o.female who presents with painful varicose veins of the left leg  Past Medical History:  Diagnosis Date  . Allergy   . Allergy to adhesive tape    Allergy to paper tape as well  . Alzheimer disease   . Anemia   . Anginal pain ([place])   . Anxiety   . Arthritis   . Asthma   . Bronchitis   . Cataract   . Chronic nausea   . Claustrophobia   . DDD (degenerative disc disease), lumbar   . Depression   . Dry eyes    uses Restasis  . Fibromyalgia   . GERD (gastroesophageal reflux disease)   . H/O bladder infections   . Heart murmur   . Hematuria   . Hyperlipidemia   . Hypertension   . Hypothyroidism   . IBS (irritable bowel syndrome)   . Insomnia   . Lumbar neuritis   . Migraines   . Obesity   . Osteoporosis   . Palpitation   . PONV (postoperative nausea and vomiting)   . RLS (restless legs syndrome)   . Shoulder fracture, right   . Sicca ([place])   . Tachycardia   . Thyroid disease     Past Surgical History:  Procedure Laterality Date  . ABDOMINAL HYSTERECTOMY     due to cancer of ovaries  . BACK SURGERY N/A 2014   x 5  . CARDIAC CATHETERIZATION     no PCI; 12/18/12 ([facility], Dr. Marcello Moores): EP study with ablation. No coronary angiography done then.  . COLONOSCOPY W/ POLYPECTOMY    . COLONOSCOPY WITH PROPOFOL N/A 02/04/2016   Procedure: COLONOSCOPY WITH PROPOFOL;  Surgeon: Robert Bellow, MD;  Location: [facility] ENDOSCOPY;  Service: Endoscopy;  Laterality: N/A;  . ESOPHAGOGASTRODUODENOSCOPY (EGD) WITH PROPOFOL N/A 02/04/2016   Procedure: ESOPHAGOGASTRODUODENOSCOPY (EGD) WITH PROPOFOL;  Surgeon: Robert Bellow, MD;  Location: [facility] ENDOSCOPY;  Service: Endoscopy;  Laterality: N/A;  . FINGER ARTHROSCOPY WITH CARPOMETACARPEL ([place]) ARTHROPLASTY Left 05/01/2016   Dr. Erlinda Hong  . GIVENS CAPSULE STUDY N/A 05/02/2016   Procedure: GIVENS CAPSULE STUDY;  Surgeon: Manya Silvas, MD;  Location[facility] ENDOSCOPY;  Service: Endoscopy;  Laterality: N/A;  . HAND  SURGERY    . HEMORRHOID SURGERY    . HIP ARTHROPLASTY    . JOINT REPLACEMENT Bilateral    knee  . KNEE ARTHROPLASTY    . REPLACEMENT TOTAL KNEE Left   . SPINE SURGERY     x 2  . tendonititis     right elbow, right wrist  . TOTAL HIP ARTHROPLASTY Left 2013  . TOTAL HIP ARTHROPLASTY Right 08/16/2015   Procedure: TOTAL HIP ARTHROPLASTY ANTERIOR APPROACH;  Surgeon: Meredith Pel, MD;  Location: [place];  Service: Orthopedics;  Laterality: Right;  . TOTAL KNEE ARTHROPLASTY Right 03/16/2015   Procedure: RIGHT TOTAL KNEE ARTHROPLASTY, PCL SACRIFICING;  Surgeon: Meredith Pel, MD;  Location: [place];  Service: Orthopedics;  Laterality: Right;    Current Outpatient Medications  Medication Sig Dispense Refill  . aspirin 81 MG chewable tablet Chew 81 mg by mouth daily.    [name] atorvastatin (LIPITOR) 40 MG tablet Take 1 tablet (40 mg total) by mouth at bedtime. 90 tablet 1  . Cholecalciferol (D3-1000) 1000 units tablet Take 1,000 Units by mouth at bedtime.     . divalproex (DEPAKOTE) 250 MG DR tablet Take 1 tablet (250 mg total) by mouth 2 (two) times daily. 180 tablet 1  . estradiol (  ESTRACE) 0.1 MG/GM vaginal cream FOR DIRECTIONS ON HOW TO   TAKE THIS MEDICINE, READ   THE ENCLOSED MEDICATION    INFORMATION FORM 42.5 g 3  . gabapentin (NEURONTIN) 300 MG capsule TAKE 3 CAPSULES (=900MG ) 3 TIMES A DAY 810 capsule 0  . lansoprazole (PREVACID) 30 MG capsule Take 1 capsule (30 mg total) daily by mouth. 90 capsule 1  . levalbuterol (XOPENEX) 1.25 MG/3ML nebulizer solution Take 1.25 mg by nebulization every 4 (four) hours as needed for wheezing. 72 mL 4  . levothyroxine (SYNTHROID, LEVOTHROID) 50 MCG tablet Take 50 mcg by mouth daily before breakfast.    . magnesium oxide (MAG-OX) 400 MG tablet Take 400 mg by mouth at bedtime. Reported on 07/28/2015    . metoprolol tartrate (LOPRESSOR) 25 MG tablet TAKE 1 TABLET BY MOUTH TWICE A DAY 180 tablet 1  . mometasone-formoterol (DULERA) 200-5 MCG/ACT AERO  Inhale 2 puffs into the lungs 2 (two) times daily. Reported on 07/28/2015 3 Inhaler 0  . montelukast (SINGULAIR) 10 MG tablet Take 1 tablet (10 mg total) by mouth at bedtime. 90 tablet 1  . nystatin cream (MYCOSTATIN) APPLY TO CORNERS OF MOUTH 3 TIMES DAILY AFTER EATING  0  . olmesartan-hydrochlorothiazide (BENICAR HCT) 40-12.5 MG tablet Take 1 tablet by mouth daily. 90 tablet 1  . omega-3 acid ethyl esters (LOVAZA) 1 g capsule Take 2 capsules (2 g total) by mouth daily. 360 capsule 1  . ondansetron (ZOFRAN) 4 MG tablet Take 1 tablet (4 mg total) by mouth every 8 (eight) hours as needed for nausea or vomiting. 20 tablet 0  . polyethylene glycol powder (GLYCOLAX/MIRALAX) powder 255 grams one bottle for capsule endoscopy prep 255 g 0  . promethazine-dextromethorphan (PROMETHAZINE-DM) 6.25-15 MG/5ML syrup Take 1.3 mLs by mouth 4 (four) times daily as needed for cough. 118 mL 0  . venlafaxine XR (EFFEXOR-XR) 150 MG 24 hr capsule Take 1 capsule (150 mg total) by mouth daily with breakfast. 90 capsule 1  . vitamin B-12 (CYANOCOBALAMIN) 1000 MCG tablet Take 1,000 mcg by mouth at bedtime.     No current facility-administered medications for this visit.     Allergies  Allergen Reactions  . Morphine Nausea And Vomiting    Ok with nausea medication  . Diazepam Nausea And Vomiting  . Lyrica [Pregabalin] Other (See Comments)    Joint swelling  . Metformin And Related     diarrhea  . Latex Rash  . Rash Away  [Petrolatum-Zinc Oxide] Rash and Swelling  . Salicylates Other (See Comments)    Other reaction(s): Headache  . Tape Rash    Please use paper tape    Indication: Patient presents with symptomatic varicose veins of the left lower extremity.  Procedure: Foam sclerotherapy was performed on the left lower extremity. Using ultrasound guidance, 5 mL of foam Sotradecol was used to inject the varicosities of the left lower extremity. Compression wraps were placed. The patient tolerated the procedure  well.

## 2017-10-17 DIAGNOSIS — E039 Hypothyroidism, unspecified: Secondary | ICD-10-CM | POA: Diagnosis not present

## 2017-10-31 ENCOUNTER — Ambulatory Visit (HOSPITAL_COMMUNITY): Admission: RE | Admit: 2017-10-31 | Payer: Medicare Other | Source: Ambulatory Visit

## 2017-11-05 ENCOUNTER — Ambulatory Visit (INDEPENDENT_AMBULATORY_CARE_PROVIDER_SITE_OTHER): Payer: Medicare Other | Admitting: Vascular Surgery

## 2017-11-05 ENCOUNTER — Encounter (INDEPENDENT_AMBULATORY_CARE_PROVIDER_SITE_OTHER): Payer: Self-pay | Admitting: Vascular Surgery

## 2017-11-05 VITALS — BP 147/68 | HR 72 | Resp 15 | Ht 64.0 in | Wt 224.0 lb

## 2017-11-05 DIAGNOSIS — I872 Venous insufficiency (chronic) (peripheral): Secondary | ICD-10-CM

## 2017-11-05 DIAGNOSIS — I83813 Varicose veins of bilateral lower extremities with pain: Secondary | ICD-10-CM

## 2017-11-05 NOTE — Progress Notes (Signed)
Varicose veins of bilateral  lower extremity with inflammation (454.1  I83.10) Current Plans   Indication: Patient presents with symptomatic varicose veins of the bilateral  lower extremity.   Procedure: Sclerotherapy using hypertonic saline mixed with 1% Lidocaine was performed on the bilateral lower extremity. Compression wraps were placed. The patient tolerated the procedure well. 

## 2017-11-06 ENCOUNTER — Ambulatory Visit (HOSPITAL_COMMUNITY): Admission: RE | Admit: 2017-11-06 | Payer: Medicare Other | Source: Ambulatory Visit

## 2017-11-08 NOTE — Progress Notes (Signed)
Office Visit Note  Patient: Jacqueline Lucas             Date of Birth: [birth year]           MRN: [id number]             PCP: [name], MD Referring: [name], MD Visit Date: 11/20/2017 Occupation: @GUAROCC @  Subjective:  Bilateral trochanteric bursitis   History of Present Illness: Jacqueline Lucas is a 67 y.o. female with history of fibromyalgia and osteoarthritis.  Patient reports that her fibromyalgia has been flaring more frequently.  She states she is having generalized muscle aches muscle tenderness.  She states she is having more muscle tenderness in her lower extremities which is something new.  She states she is also having muscle tension and muscle tenderness in the trapezius muscles bilaterally.  She has bilateral trochanteric bursitis.  She would like cortisone injections today in the office.  She states that she has had increased fatigue and insomnia.  She has been taking gabapentin 300 mg 3 times daily.  She states that she discontinued baclofen while her fibromyalgia is well controlled but wonders if she should restart.  She continues to have pain in bilateral hands.  She states that she has intermittent swelling as well as tingling in her fingers.  She denies any history of carpal tunnel.  She states that she has had a nerve conduction study in the past which was negative.  She states that she needs right [facility] joint surgery but does not want to move forward with that at this time.  She had left [facility] joint surgery performed by Dr. Sherrian Divers and has occasional discomfort.    Activities of Daily Living:  Patient reports morning stiffness for 30 minutes.   Patient Reports nocturnal pain.  Difficulty dressing/grooming: Denies Difficulty climbing stairs: Denies Difficulty getting out of chair: Denies Difficulty using hands for taps, buttons, cutlery, and/or writing: Reports  Review of Systems  Constitutional: Positive for fatigue.  HENT: Positive for mouth sores  (From parital denture) and mouth dryness. Negative for nose dryness.   Eyes: Positive for dryness. Negative for pain and visual disturbance.  Respiratory: Negative for cough, hemoptysis, shortness of breath and difficulty breathing.   Cardiovascular: Negative for chest pain, palpitations, hypertension and swelling in legs/feet.  Gastrointestinal: Positive for constipation and diarrhea (Hx of IBS ). Negative for blood in stool.  Endocrine: Negative for increased urination.  Genitourinary: Negative for painful urination.  Musculoskeletal: Positive for arthralgias, joint pain, myalgias, morning stiffness, muscle tenderness and myalgias. Negative for joint swelling and muscle weakness.  Skin: Negative for color change, pallor, rash, hair loss, nodules/bumps, skin tightness, ulcers and sensitivity to sunlight.  Allergic/Immunologic: Negative for susceptible to infections.  Neurological: Positive for headaches (Hx of migraines and cluster headaches). Negative for dizziness, numbness and weakness.  Hematological: Negative for swollen glands.  Psychiatric/Behavioral: Positive for sleep disturbance. Negative for depressed mood. The patient is not nervous/anxious.     PMFS History:  Patient Active Problem List   Diagnosis Date Noted  . Hx of hysterectomy 08/13/2017  . Pain in left wrist 06/12/2017  . Chronic venous insufficiency 06/09/2017  . Varicose veins of both lower extremities with pain 04/30/2017  . Claudication ([place]) 04/30/2017  . Arthritis of carpometacarpal [facility]) joint of left thumb 05/12/2016  . Bilateral thumb pain 05/01/2016  . Chronic bronchitis ([place]) 04/14/2016  . Anemia 02/03/2016  . Status post bilateral total hip replacement 01/10/2016  . Sicca ([place]) 01/10/2016  .  Age related osteoporosis 01/10/2016  . Hip arthritis 08/16/2015  . Microscopic hematuria 07/30/2015  . Vaginal atrophy 07/30/2015  . GAD (generalized anxiety disorder) 07/08/2015  . Moderate episode of recurrent  major depressive disorder ([place]) 07/08/2015  . Migraine with aura and without status migrainosus, not intractable 07/08/2015  . Dyslipidemia 07/08/2015  . Degenerative arthritis of right knee 03/16/2015  . Atrophic vaginitis 01/31/2015  . Recurrent UTI 12/31/2014  . IBS (irritable bowel syndrome) 10/29/2014  . Asthma, moderate persistent 09/08/2014  . Allergic state 09/07/2014  . Colon polyp 09/07/2014  . Hemorrhoid 09/07/2014  . Constrictive tenosynovitis 03/12/2014  . H/O total knee replacement, bilateral 07/31/2013  . CAD (coronary artery disease) 11/28/2012  . Anxiety 11/28/2012  . Acid reflux 11/28/2012  . Cardiac murmur 11/28/2012  . BP (high blood pressure) 11/28/2012  . Cannot sleep 11/28/2012  . Adiposity 11/28/2012  . Arthritis, degenerative 11/28/2012  . Restless leg 11/28/2012  . Supraventricular tachycardia ([place]) 11/28/2012  . Fibromyalgia 11/28/2012  . Tachycardia 11/28/2012    Past Medical History:  Diagnosis Date  . Allergy   . Allergy to adhesive tape    Allergy to paper tape as well  . Alzheimer disease   . Anemia   . Anginal pain ([place])   . Anxiety   . Arthritis   . Asthma   . Bronchitis   . Cataract   . Chronic nausea   . Claustrophobia   . DDD (degenerative disc disease), lumbar   . Depression   . Dry eyes    uses Restasis  . Fibromyalgia   . GERD (gastroesophageal reflux disease)   . H/O bladder infections   . Heart murmur   . Hematuria   . Hyperlipidemia   . Hypertension   . Hypothyroidism   . IBS (irritable bowel syndrome)   . Insomnia   . Lumbar neuritis   . Migraines   . Obesity   . Osteoporosis   . Palpitation   . PONV (postoperative nausea and vomiting)   . RLS (restless legs syndrome)   . Shoulder fracture, right   . Sicca ([place])   . Tachycardia   . Thyroid disease     Family History  Problem Relation Age of Onset  . Diabetes Mother   . Hypertension Mother   . Heart disease Mother   . Alzheimer's disease Mother   .  Cancer Father   . Diabetes Father   . Hypertension Sister   . Depression Sister   . Fibromyalgia Sister   . Cancer Brother   . Heart disease Brother   . Stroke Son   . Fibromyalgia Daughter   . Bladder Cancer Neg Hx   . Kidney disease Neg Hx   . Kidney cancer Neg Hx    Past Surgical History:  Procedure Laterality Date  . ABDOMINAL HYSTERECTOMY     due to cancer of ovaries  . BACK SURGERY N/A 2014   x 5  . CARDIAC CATHETERIZATION     no PCI; 12/18/12 ([facility], Dr. Marcello Moores): EP study with ablation. No coronary angiography done then.  . COLONOSCOPY W/ POLYPECTOMY    . COLONOSCOPY WITH PROPOFOL N/A 02/04/2016   Procedure: COLONOSCOPY WITH PROPOFOL;  Surgeon: Robert Bellow, MD;  Location: [facility] ENDOSCOPY;  Service: Endoscopy;  Laterality: N/A;  . ESOPHAGOGASTRODUODENOSCOPY (EGD) WITH PROPOFOL N/A 02/04/2016   Procedure: ESOPHAGOGASTRODUODENOSCOPY (EGD) WITH PROPOFOL;  Surgeon: Robert Bellow, MD;  Location: [facility] ENDOSCOPY;  Service: Endoscopy;  Laterality: N/A;  . FINGER ARTHROSCOPY WITH CARPOMETACARPEL (  [place]) ARTHROPLASTY Left 05/01/2016   Dr. Erlinda Hong  . GIVENS CAPSULE STUDY N/A 05/02/2016   Procedure: GIVENS CAPSULE STUDY;  Surgeon: Manya Silvas, MD;  Location: [facility] ENDOSCOPY;  Service: Endoscopy;  Laterality: N/A;  . HAND SURGERY    . HEMORRHOID SURGERY    . HIP ARTHROPLASTY    . JOINT REPLACEMENT Bilateral    knee  . KNEE ARTHROPLASTY    . REPLACEMENT TOTAL KNEE Left   . SPINE SURGERY     x 2  . tendonititis     right elbow, right wrist  . TOTAL HIP ARTHROPLASTY Left 2013  . TOTAL HIP ARTHROPLASTY Right 08/16/2015   Procedure: TOTAL HIP ARTHROPLASTY ANTERIOR APPROACH;  Surgeon: Meredith Pel, MD;  Location: [facility];  Service: Orthopedics;  Laterality: Right;  . TOTAL KNEE ARTHROPLASTY Right 03/16/2015   Procedure: RIGHT TOTAL KNEE ARTHROPLASTY, PCL SACRIFICING;  Surgeon: Meredith Pel, MD;  Location: [facility];  Service: Orthopedics;  Laterality: Right;   Social History    Social History Narrative  . Not on file    Objective: Vital Signs: BP 138/61 (BP Location: Right Arm, Patient Position: Sitting, Cuff Size: Large)   Pulse 64   Resp 14   Ht 5\' 4"  (1.626 m)   Wt 223 lb 9.6 oz (101.4 kg)   BMI 38.38 kg/m    Physical Exam  Constitutional: She is oriented to person, place, and time. She appears well-developed and well-nourished.  HENT:  Head: Normocephalic and atraumatic.  Eyes: Conjunctivae and EOM are normal.  Neck: Normal range of motion.  Cardiovascular: Normal rate, regular rhythm, normal heart sounds and intact distal pulses.  Pulmonary/Chest: Effort normal and breath sounds normal.  Abdominal: Soft. Bowel sounds are normal.  Lymphadenopathy:    She has no cervical adenopathy.  Neurological: She is alert and oriented to person, place, and time.  Skin: Skin is warm and dry. Capillary refill takes less than 2 seconds.  Psychiatric: She has a normal mood and affect. Her behavior is normal.  Nursing note and vitals reviewed.    Musculoskeletal Exam: Generalized hyperalgesia on exam.  C-spine, thoracic spine, lumbar spine good range of motion.  She has tenderness and tension in bilateral trapezius muscles.  Shoulder joints, elbow joints, wrist joints, MCPs, PIPs, DIPs good range of motion no synovitis.  Discomfort with left shoulder range of motion.  She has PIP and DIP synovial thickening consistent with osteoarthritis of bilateral hands.  Bilateral total hip replacements and bilateral knee replacements are doing well.  No warmth or effusion noted.  She has tenderness to palpation of bilateral trochanteric bursa.  CDAI Exam: CDAI Score: Not documented Patient Global Assessment: Not documented; Provider Global Assessment: Not documented Swollen: Not documented; Tender: Not documented Joint Exam   Not documented   There is currently no information documented on the homunculus. Go to the Rheumatology activity and complete the homunculus joint  exam.  Investigation: No additional findings.  Imaging: No results found.  Recent Labs: Lab Results  Component Value Date   WBC 7.2 09/11/2017   HGB 12.4 09/11/2017   PLT 211 09/11/2017   NA 138 09/11/2017   K 4.3 09/11/2017   CL 102 09/11/2017   CO2 27 09/11/2017   GLUCOSE 161 (H) 09/11/2017   BUN 13 09/11/2017   CREATININE 0.78 09/11/2017   BILITOT 0.5 12/04/2016   ALKPHOS 105 12/10/2015   AST 10 12/04/2016   ALT 8 12/04/2016   PROT 6.5 12/04/2016   ALBUMIN 4.0 12/10/2015  CALCIUM 8.9 09/11/2017   GFRAA >60 09/11/2017    Speciality Comments: No specialty comments available.  Procedures:  Large Joint Inj: bilateral greater trochanter on 11/20/2017 1:55 PM Indications: pain Details: 27 G 1.5 in needle, lateral approach  Arthrogram: No  Medications (Right): 1.5 mL lidocaine 1 %; 40 mg triamcinolone acetonide 40 MG/ML Aspirate (Right): 0 mL Medications (Left): 1.5 mL lidocaine 1 %; 40 mg triamcinolone acetonide 40 MG/ML Aspirate (Left): 0 mL Outcome: tolerated well, no immediate complications Procedure, treatment alternatives, risks and benefits explained, specific risks discussed. Consent was given by the patient. Immediately prior to procedure a time out was called to verify the correct patient, procedure, equipment, support staff and site/side marked as required. Patient was prepped and draped in the usual sterile fashion.     Allergies: Morphine; Diazepam; Lyrica [pregabalin]; Metformin and related; Latex; Rash away  [petrolatum-zinc oxide]; Salicylates; and Tape   Assessment / Plan:     Visit Diagnoses: Fibromyalgia: She has generalized hyperalgesia on exam today.  She has been having more frequent fibromyalgia flares.  She has generalized muscle aches and muscle tenderness.  She is having tension and muscle spasms in the trapezius muscles bilaterally.  She is currently going to physical therapy for lower back pain.  She presents today with bilateral  trochanteric bursitis.  She requested bilateral cortisone injections which she tolerated well.  She was given a prescription for physical therapy to see Ileana Roup for bilateral trochanteric bursitis.  She was encouraged to try to stay active and exercise on a daily basis.  She continues to have chronic fatigue and insomnia.  She will continue taking gabapentin 300 mg 3 times daily.  She does not need a refill of gabapentin at this time.  Trochanteric bursitis of both hips: She has tenderness of bilateral trochanteric bursa on exam.  She requested a cortisone injection today in the office.  She tolerated the procedure well.  She is given a handout of exercises that she can perform at home.  She was given a prescription for physical therapy with Ileana Roup for evaluaiton and treatment of bilateral trochanteric bursitis.  Status post bilateral total hip replacement: Doing well.  Good range of motion.  She is no discomfort at this time.  H/O total knee replacement, bilateral: Doing well.  No warmth or effusion.  She is good range of motion.  She has no discomfort in her knee joints at this time.  Primary osteoarthritis of first carpometacarpal joint of left hand: She had surgery performed by Dr. 2.  She has occasional discomfort in her left CMC joint.  No synovitis was noted.  She was given a handout of that joint inflammatory that she can take.  She has right CMC joint pain and knee surgery but is postponing it at this time.  Keratoconjunctivitis sicca ([place]): She has chronic eye dryness.  Other medical conditions are listed as follows:  Osteopenia of multiple sites - T score -2.0 in 2015.  She has been taking calcium and vitamin D.  She gets DEXA through her PCP.  History of anemia  Recurrent UTI  History of cardiac murmur - and SVT  Restless leg  History of depression  History of anxiety  History of gastroesophageal reflux (GERD)  History of hypertension   Orders: Orders Placed This  Encounter  Procedures  . Large Joint Inj   No orders of the defined types were placed in this encounter.   Face-to-face time spent with patient was 30 minutes. Greater  than 50% of time was spent in counseling and coordination of care.  Follow-Up Instructions: Return in about 6 months (around 05/21/2018) for Fibromyalgia, Osteoarthritis.   Ofilia Neas, PA-C   I examined and evaluated the patient with Hazel Sams PA.  Patient continues to have some generalized pain from fibromyalgia.  She also has underlying osteoarthritis.  She had no synovitis on my examination.  She had tenderness on palpation of bilateral trochanteric bursa.  Per her request trochanteric bursa were injected with the cortisone.  She tolerated the procedure well.  The plan of care was discussed as noted above.  Bo Merino, MD  Note - This record has been created using Editor, commissioning.  Chart creation errors have been sought, but may not always  have been located. Such creation errors do not reflect on  the standard of medical care.

## 2017-11-15 ENCOUNTER — Other Ambulatory Visit: Payer: Self-pay | Admitting: Rheumatology

## 2017-11-15 NOTE — Telephone Encounter (Signed)
Last Visit: 05/15/17 Next Visit: 11/20/17  Okay to refill per Dr. Estanislado Pandy

## 2017-11-19 DIAGNOSIS — M47816 Spondylosis without myelopathy or radiculopathy, lumbar region: Secondary | ICD-10-CM | POA: Diagnosis not present

## 2017-11-19 DIAGNOSIS — I10 Essential (primary) hypertension: Secondary | ICD-10-CM | POA: Diagnosis not present

## 2017-11-19 DIAGNOSIS — M4726 Other spondylosis with radiculopathy, lumbar region: Secondary | ICD-10-CM | POA: Diagnosis not present

## 2017-11-19 DIAGNOSIS — Z7189 Other specified counseling: Secondary | ICD-10-CM | POA: Diagnosis not present

## 2017-11-19 DIAGNOSIS — M48062 Spinal stenosis, lumbar region with neurogenic claudication: Secondary | ICD-10-CM | POA: Diagnosis not present

## 2017-11-19 DIAGNOSIS — R29898 Other symptoms and signs involving the musculoskeletal system: Secondary | ICD-10-CM | POA: Diagnosis not present

## 2017-11-19 DIAGNOSIS — Z6836 Body mass index (BMI) 36.0-36.9, adult: Secondary | ICD-10-CM | POA: Diagnosis not present

## 2017-11-20 ENCOUNTER — Ambulatory Visit (INDEPENDENT_AMBULATORY_CARE_PROVIDER_SITE_OTHER): Payer: Medicare Other | Admitting: Rheumatology

## 2017-11-20 ENCOUNTER — Encounter: Payer: Self-pay | Admitting: Rheumatology

## 2017-11-20 VITALS — BP 138/61 | HR 64 | Resp 14 | Ht 64.0 in | Wt 223.6 lb

## 2017-11-20 DIAGNOSIS — I209 Angina pectoris, unspecified: Secondary | ICD-10-CM

## 2017-11-20 DIAGNOSIS — M1812 Unilateral primary osteoarthritis of first carpometacarpal joint, left hand: Secondary | ICD-10-CM | POA: Diagnosis not present

## 2017-11-20 DIAGNOSIS — N390 Urinary tract infection, site not specified: Secondary | ICD-10-CM | POA: Diagnosis not present

## 2017-11-20 DIAGNOSIS — Z96643 Presence of artificial hip joint, bilateral: Secondary | ICD-10-CM | POA: Diagnosis not present

## 2017-11-20 DIAGNOSIS — M7061 Trochanteric bursitis, right hip: Secondary | ICD-10-CM

## 2017-11-20 DIAGNOSIS — Z8679 Personal history of other diseases of the circulatory system: Secondary | ICD-10-CM

## 2017-11-20 DIAGNOSIS — Z8719 Personal history of other diseases of the digestive system: Secondary | ICD-10-CM | POA: Diagnosis not present

## 2017-11-20 DIAGNOSIS — Z96653 Presence of artificial knee joint, bilateral: Secondary | ICD-10-CM

## 2017-11-20 DIAGNOSIS — M797 Fibromyalgia: Secondary | ICD-10-CM | POA: Diagnosis not present

## 2017-11-20 DIAGNOSIS — M8589 Other specified disorders of bone density and structure, multiple sites: Secondary | ICD-10-CM

## 2017-11-20 DIAGNOSIS — Z862 Personal history of diseases of the blood and blood-forming organs and certain disorders involving the immune mechanism: Secondary | ICD-10-CM | POA: Diagnosis not present

## 2017-11-20 DIAGNOSIS — M3501 Sicca syndrome with keratoconjunctivitis: Secondary | ICD-10-CM

## 2017-11-20 DIAGNOSIS — M7062 Trochanteric bursitis, left hip: Secondary | ICD-10-CM | POA: Diagnosis not present

## 2017-11-20 DIAGNOSIS — G2581 Restless legs syndrome: Secondary | ICD-10-CM

## 2017-11-20 DIAGNOSIS — Z8659 Personal history of other mental and behavioral disorders: Secondary | ICD-10-CM

## 2017-11-20 MED ORDER — LIDOCAINE HCL 1 % IJ SOLN
1.5000 mL | INTRAMUSCULAR | Status: AC | PRN
  Administered 2017-11-20: 1.5 mL

## 2017-11-20 MED ORDER — TRIAMCINOLONE ACETONIDE 40 MG/ML IJ SUSP
40.0000 mg | INTRAMUSCULAR | Status: AC | PRN
  Administered 2017-11-20: 40 mg via INTRA_ARTICULAR

## 2017-11-20 NOTE — Patient Instructions (Signed)
Natural anti-inflammatories  You can purchase these at [employer], [employer] or online.  . Turmeric (capsules)  . Ginger (ginger root or capsules)  . Omega 3 (Fish, flax seeds, chia seeds, walnuts, almonds)  . Tart cherry (dried or extract)   Patient should be under the care of a physician while taking these supplements. This may not be reproduced without the permission of Dr. Bo Merino. Trochanteric Bursitis Trochanteric bursitis is a condition that causes hip pain. Trochanteric bursitis happens when fluid-filled sacs (bursae) in the hip get irritated. Normally these sacs absorb shock and help strong bands of tissue (tendons) in your hip glide smoothly over each other and over your hip bones. What are the causes? This condition results from increased friction between the hip bones and the tendons that go over them. This condition can happen if you:  Have weak hips.  Use your hip muscles too much (overuse).  Get hit in the hip.  What increases the risk? This condition is more likely to develop in:  Women.  Adults who are middle-aged or older.  People with arthritis or a spinal condition.  People with weak buttocks muscles (gluteal muscles).  People who have one leg that is shorter than the other.  People who participate in certain kinds of athletic activities, such as: ? Running sports, especially long-distance running. ? Contact sports, like football or martial arts. ? Sports in which falls may occur, like skiing.  What are the signs or symptoms? The main symptom of this condition is pain and tenderness over the point of your hip. The pain may be:  Sharp and intense.  Dull and achy.  Felt on the outside of your thigh.  It may increase when you:  Lie on your side.  Walk or run.  Go up on stairs.  Sit.  Stand up after sitting.  Stand for long periods of time.  How is this diagnosed? This condition may be diagnosed based on:  Your  symptoms.  Your medical history.  A physical exam.  Imaging tests, such as: ? X-rays to check your bones. ? An MRI or ultrasound to check your tendons and muscles.  During your physical exam, your health care provider will check the movement and strength of your hip. He or she may press on the point of your hip to check for pain. How is this treated? This condition may be treated by:  Resting.  Reducing your activity.  Avoiding activities that cause pain.  Using crutches, a cane, or a walker to decrease the strain on your hip.  Taking medicine to help with swelling.  Having medicine injected into the bursae to help with swelling.  Using ice, heat, and massage therapy for pain relief.  Physical therapy exercises for strength and flexibility.  Surgery (rare).  Follow these instructions at home: Activity  Rest.  Avoid activities that cause pain.  Return to your normal activities as told by your health care provider. Ask your health care provider what activities are safe for you. Managing pain, stiffness, and swelling  Take over-the-counter and prescription medicines only as told by your health care provider.  If directed, apply heat to the injured area as told by your health care provider. ? Place a towel between your skin and the heat source. ? Leave the heat on for 20-30 minutes. ? Remove the heat if your skin turns bright red. This is especially important if you are unable to feel pain, heat, or cold. You may have  a greater risk of getting burned.  If directed, apply ice to the injured area: ? Put ice in a plastic bag. ? Place a towel between your skin and the bag. ? Leave the ice on for 20 minutes, 2-3 times a day. General instructions  If the affected leg is one that you use for driving, ask your health care provider when it is safe to drive.  Use crutches, a cane, or a walker as told by your health care provider.  If one of your legs is shorter than the  other, get fitted for a shoe insert.  Lose weight if you are overweight. How is this prevented?  Wear supportive footwear that is appropriate for your sport.  If you have hip pain, start any new exercise or sport slowly.  Maintain physical fitness, including: ? Strength. ? Flexibility. Contact a health care provider if:  Your pain does not improve with 2-4 weeks. Get help right away if:  You develop severe pain.  You have a fever.  You develop increased redness over your hip.  You have a change in your bowel function or bladder function.  You cannot control the muscles in your feet. This information is not intended to replace advice given to you by your health care provider. Make sure you discuss any questions you have with your health care provider. Document Released: 03/23/2004 Document Revised: 10/20/2015 Document Reviewed: 01/29/2015 Elsevier Interactive Patient Education  [employer].

## 2017-11-26 ENCOUNTER — Other Ambulatory Visit: Payer: Self-pay | Admitting: Family Medicine

## 2017-11-26 ENCOUNTER — Ambulatory Visit (INDEPENDENT_AMBULATORY_CARE_PROVIDER_SITE_OTHER): Payer: Medicare Other | Admitting: Vascular Surgery

## 2017-11-26 DIAGNOSIS — E785 Hyperlipidemia, unspecified: Secondary | ICD-10-CM

## 2017-11-28 ENCOUNTER — Ambulatory Visit: Payer: Medicare Other | Admitting: Urology

## 2017-12-04 ENCOUNTER — Other Ambulatory Visit: Payer: Self-pay | Admitting: Family Medicine

## 2017-12-04 DIAGNOSIS — E785 Hyperlipidemia, unspecified: Secondary | ICD-10-CM

## 2017-12-06 ENCOUNTER — Ambulatory Visit
Admission: RE | Admit: 2017-12-06 | Discharge: 2017-12-06 | Disposition: A | Discharge status: Home / Self Care | Payer: Medicare Other | Source: Ambulatory Visit | Attending: Orthopedic Surgery | Admitting: Orthopedic Surgery

## 2017-12-06 ENCOUNTER — Other Ambulatory Visit: Payer: Self-pay | Admitting: Family Medicine

## 2017-12-06 DIAGNOSIS — F3160 Bipolar disorder, current episode mixed, unspecified: Secondary | ICD-10-CM

## 2017-12-06 DIAGNOSIS — M25522 Pain in left elbow: Secondary | ICD-10-CM | POA: Diagnosis not present

## 2017-12-06 DIAGNOSIS — R2232 Localized swelling, mass and lump, left upper limb: Secondary | ICD-10-CM | POA: Diagnosis not present

## 2017-12-06 LAB — POCT I-STAT CREATININE: Creatinine, Ser: 0.8 mg/dL (ref 0.44–1.00)

## 2017-12-06 MED ORDER — GADOBENATE DIMEGLUMINE 529 MG/ML IV SOLN
20.0000 mL | Freq: Once | INTRAVENOUS | Status: AC | PRN
  Administered 2017-12-06: 20 mL via INTRAVENOUS

## 2017-12-07 DIAGNOSIS — E039 Hypothyroidism, unspecified: Secondary | ICD-10-CM | POA: Diagnosis not present

## 2017-12-07 DIAGNOSIS — E041 Nontoxic single thyroid nodule: Secondary | ICD-10-CM | POA: Diagnosis not present

## 2017-12-11 ENCOUNTER — Encounter: Payer: Self-pay | Admitting: Urology

## 2017-12-11 ENCOUNTER — Ambulatory Visit (INDEPENDENT_AMBULATORY_CARE_PROVIDER_SITE_OTHER): Payer: Medicare Other | Admitting: Urology

## 2017-12-11 VITALS — BP 162/95 | HR 76 | Ht 64.0 in | Wt 220.2 lb

## 2017-12-11 DIAGNOSIS — Z87440 Personal history of urinary (tract) infections: Secondary | ICD-10-CM

## 2017-12-11 DIAGNOSIS — Z87448 Personal history of other diseases of urinary system: Secondary | ICD-10-CM

## 2017-12-11 DIAGNOSIS — I209 Angina pectoris, unspecified: Secondary | ICD-10-CM

## 2017-12-11 DIAGNOSIS — N952 Postmenopausal atrophic vaginitis: Secondary | ICD-10-CM | POA: Diagnosis not present

## 2017-12-11 NOTE — Progress Notes (Signed)
8:17 PM   Jacqueline Lucas Aug 17, 1950 [id number]  Referring provider: [name], MD [address] [place], [place] [address]  Chief Complaint  Patient presents with  . Follow-up    HPI: Patient is a 67 year old Caucasian female with recurrent UTI's, vaginal atrophy and a history of hematuria who presents today for a follow up.  History of recurrent UTI's Risk factors: age, vaginal atrophy and dementia.  No documented infections in 2018.  She is not having dysuria or suprapubic pain.  She has not had fevers, chills, nausea or vomiting.    Vaginal atrophy She does have vaginal atrophy and is using vaginal estrogen cream 3 nights weekly.  History of hematuria She does not experience dysuria or suprapubic pain.  She underwent a hematuria work up in 09/2013 with a CT Urogram and cystoscopy and no GU pathology was discovered.  Her UA is negative today.  She has not had any gross hematuria.     Today, is experiencing urgency x 0-3 (stable), frequency x 4-7 (stable), not restricting fluids to avoid visits to the restroom, not engaging in toilet mapping, incontinence x 0-3 (stable) and nocturia x 0-3 (stable).   Her main complaint today is nocturia.  Her PVR is 0 mL.    PMH: Past Medical History:  Diagnosis Date  . Allergy   . Allergy to adhesive tape    Allergy to paper tape as well  . Alzheimer disease ([place])   . Anemia   . Anginal pain ([place])   . Anxiety   . Arthritis   . Asthma   . Bronchitis   . Cataract   . Chronic nausea   . Claustrophobia   . DDD (degenerative disc disease), lumbar   . Depression   . Dry eyes    uses Restasis  . Fibromyalgia   . GERD (gastroesophageal reflux disease)   . H/O bladder infections   . Heart murmur   . Hematuria   . Hyperlipidemia   . Hypertension   . Hypothyroidism   . IBS (irritable bowel syndrome)   . Insomnia   . Lumbar neuritis   . Migraines   . Obesity   . Osteoporosis   . Palpitation   . PONV  (postoperative nausea and vomiting)   . RLS (restless legs syndrome)   . Shoulder fracture, right   . Sicca ([place])   . Tachycardia   . Thyroid disease     Surgical History: Past Surgical History:  Procedure Laterality Date  . ABDOMINAL HYSTERECTOMY     due to cancer of ovaries  . BACK SURGERY N/A 2014   x 5  . CARDIAC CATHETERIZATION     no PCI; 12/18/12 ([facility], Dr. Marcello Moores): EP study with ablation. No coronary angiography done then.  . COLONOSCOPY W/ POLYPECTOMY    . COLONOSCOPY WITH PROPOFOL N/A 02/04/2016   Procedure: COLONOSCOPY WITH PROPOFOL;  Surgeon: Robert Bellow, MD;  Location: [facility] ENDOSCOPY;  Service: Endoscopy;  Laterality: N/A;  . ESOPHAGOGASTRODUODENOSCOPY (EGD) WITH PROPOFOL N/A 02/04/2016   Procedure: ESOPHAGOGASTRODUODENOSCOPY (EGD) WITH PROPOFOL;  Surgeon: Robert Bellow, MD;  Location: [facility] ENDOSCOPY;  Service: Endoscopy;  Laterality: N/A;  . FINGER ARTHROSCOPY WITH CARPOMETACARPEL ([place]) ARTHROPLASTY Left 05/01/2016   Dr. Erlinda Hong  . GIVENS CAPSULE STUDY N/A 05/02/2016   Procedure: GIVENS CAPSULE STUDY;  Surgeon: Manya Silvas, MD;  Location: [facility] ENDOSCOPY;  Service: Endoscopy;  Laterality: N/A;  . HAND SURGERY    . HEMORRHOID SURGERY    .  HIP ARTHROPLASTY    . JOINT REPLACEMENT Bilateral    knee  . KNEE ARTHROPLASTY    . REPLACEMENT TOTAL KNEE Left   . SPINE SURGERY     x 2  . tendonititis     right elbow, right wrist  . TOTAL HIP ARTHROPLASTY Left 2013  . TOTAL HIP ARTHROPLASTY Right 08/16/2015   Procedure: TOTAL HIP ARTHROPLASTY ANTERIOR APPROACH;  Surgeon: Meredith Pel, MD;  Location: [facility];  Service: Orthopedics;  Laterality: Right;  . TOTAL KNEE ARTHROPLASTY Right 03/16/2015   Procedure: RIGHT TOTAL KNEE ARTHROPLASTY, PCL SACRIFICING;  Surgeon: Meredith Pel, MD;  Location: [facility];  Service: Orthopedics;  Laterality: Right;    Home Medications:  Allergies as of 12/11/2017      Reactions   Morphine Nausea And Vomiting   Ok with nausea  medication   Diazepam Nausea And Vomiting   Lyrica [pregabalin] Other (See Comments)   Joint swelling   Metformin And Related    diarrhea   Latex Rash   Rash Away  [petrolatum-zinc Oxide] Rash, Swelling   Salicylates Other (See Comments)   Other reaction(s): Headache   Tape Rash   Please use paper tape      Medication List        Accurate as of 12/11/17 11:59 PM. Always use your most recent med list.          aspirin 81 MG chewable tablet Chew 81 mg by mouth daily.   atorvastatin 40 MG tablet Commonly known as:  LIPITOR TAKE 1 TABLET BY MOUTH EVERYDAY AT BEDTIME   baclofen 10 MG tablet Commonly known as:  LIORESAL TAKE ONE TABLET AT 7AM AND ONE TABLET AT 2PM AS NEEDED FOR MUSCLE SPASM   D3-1000 1000 units tablet Generic drug:  Cholecalciferol Take 1,000 Units by mouth at bedtime.   divalproex 250 MG DR tablet Commonly known as:  DEPAKOTE TAKE 1 TABLET TWICE A DAY   estradiol 0.1 MG/GM vaginal cream Commonly known as:  ESTRACE FOR DIRECTIONS ON HOW TO   TAKE THIS MEDICINE, READ   THE ENCLOSED MEDICATION    INFORMATION FORM   gabapentin 300 MG capsule Commonly known as:  NEURONTIN TAKE 3 CAPSULES (=900MG ) 3 TIMES A DAY   lansoprazole 30 MG capsule Commonly known as:  PREVACID Take 1 capsule (30 mg total) daily by mouth.   levalbuterol 1.25 MG/3ML nebulizer solution Commonly known as:  XOPENEX Take 1.25 mg by nebulization every 4 (four) hours as needed for wheezing.   levothyroxine 50 MCG tablet Commonly known as:  SYNTHROID, LEVOTHROID Take 50 mcg by mouth daily before breakfast.   magnesium oxide 400 MG tablet Commonly known as:  MAG-OX Take 400 mg by mouth at bedtime. Reported on 07/28/2015   metoprolol tartrate 25 MG tablet Commonly known as:  LOPRESSOR TAKE 1 TABLET BY MOUTH TWICE A DAY   mometasone-formoterol 200-5 MCG/ACT Aero Commonly known as:  DULERA Inhale 2 puffs into the lungs 2 (two) times daily. Reported on 07/28/2015   montelukast 10  MG tablet Commonly known as:  SINGULAIR Take 1 tablet (10 mg total) by mouth at bedtime.   nystatin cream Commonly known as:  MYCOSTATIN APPLY TO CORNERS OF MOUTH 3 TIMES DAILY AFTER EATING   olmesartan-hydrochlorothiazide 40-12.5 MG tablet Commonly known as:  BENICAR HCT Take 1 tablet by mouth daily.   omega-3 acid ethyl esters 1 g capsule Commonly known as:  LOVAZA Take 2 capsules (2 g total) by mouth daily.  ondansetron 4 MG tablet Commonly known as:  ZOFRAN Take 1 tablet (4 mg total) by mouth every 8 (eight) hours as needed for nausea or vomiting.   polyethylene glycol powder powder Commonly known as:  GLYCOLAX/MIRALAX 255 grams one bottle for capsule endoscopy prep   venlafaxine XR 150 MG 24 hr capsule Commonly known as:  EFFEXOR-XR Take 1 capsule (150 mg total) by mouth daily with breakfast.   vitamin B-12 1000 MCG tablet Commonly known as:  CYANOCOBALAMIN Take 1,000 mcg by mouth at bedtime.       Allergies:  Allergies  Allergen Reactions  . Morphine Nausea And Vomiting    Ok with nausea medication  . Diazepam Nausea And Vomiting  . Lyrica [Pregabalin] Other (See Comments)    Joint swelling  . Metformin And Related     diarrhea  . Latex Rash  . Rash Away  [Petrolatum-Zinc Oxide] Rash and Swelling  . Salicylates Other (See Comments)    Other reaction(s): Headache  . Tape Rash    Please use paper tape    Family History: Family History  Problem Relation Age of Onset  . Diabetes Mother   . Hypertension Mother   . Heart disease Mother   . Alzheimer's disease Mother   . Cancer Father   . Diabetes Father   . Hypertension Sister   . Depression Sister   . Fibromyalgia Sister   . Cancer Brother   . Heart disease Brother   . Stroke Son   . Fibromyalgia Daughter   . Bladder Cancer Neg Hx   . Kidney disease Neg Hx   . Kidney cancer Neg Hx     Social History:  reports that she has never smoked. She has never used smokeless tobacco. She reports that  she does not drink alcohol or use drugs.  ROS: UROLOGY Frequent Urination?: No Hard to postpone urination?: No Burning/pain with urination?: No Get up at night to urinate?: Yes Leakage of urine?: No Urine stream starts and stops?: No Trouble starting stream?: No Do you have to strain to urinate?: No Blood in urine?: No Urinary tract infection?: No Sexually transmitted disease?: No Injury to kidneys or bladder?: No Painful intercourse?: No Weak stream?: No Currently pregnant?: No Vaginal bleeding?: No Last menstrual period?: n  Gastrointestinal Nausea?: No Vomiting?: No Indigestion/heartburn?: Yes Diarrhea?: Yes Constipation?: Yes  Constitutional Fever: No Night sweats?: No Weight loss?: No Fatigue?: No  Skin Skin rash/lesions?: No Itching?: Yes  Eyes Blurred vision?: No Double vision?: No  Ears/Nose/Throat Sore throat?: No Sinus problems?: No  Hematologic/Lymphatic Swollen glands?: No Easy bruising?: No  Cardiovascular Leg swelling?: No Chest pain?: No  Respiratory Cough?: No Shortness of breath?: No  Endocrine Excessive thirst?: No  Musculoskeletal Back pain?: Yes Joint pain?: Yes  Neurological Headaches?: Yes Dizziness?: Yes  Psychologic Depression?: No Anxiety?: Yes  Physical Exam: BP (!) 162/95 (BP Location: Left Arm, Patient Position: Sitting, Cuff Size: Large)   Pulse 76   Ht 5\' 4"  (1.626 m)   Wt 220 lb 3.2 oz (99.9 kg)   BMI 37.80 kg/m   Constitutional: Well nourished. Alert and oriented, No acute distress. HEENT: [place] AT, moist mucus membranes. Trachea midline, no masses. Cardiovascular: No clubbing, cyanosis, or edema. Respiratory: Normal respiratory effort, no increased work of breathing. GI: Abdomen is soft, non tender, non distended, no abdominal masses. Liver and spleen not palpable.  No hernias appreciated.  Stool sample for occult testing is not indicated.   GU: No CVA tenderness.  No  bladder fullness or masses.   Atrophic external genitalia, normal pubic hair distribution, no lesions.  Normal urethral meatus, no lesions, no prolapse, no discharge.   No urethral masses, tenderness and/or tenderness. No bladder fullness, tenderness or masses. Pale vagina mucosa, poor estrogen effect, no discharge, no lesions, poor pelvic support, grade II cystocele and no rectocele noted.  Cervix and uterus are surgically absent.  No adnexal/parametria masses or tenderness noted.  Anus and perineum are without rashes or lesions.    Skin: No rashes, bruises or suspicious lesions. Lymph: No cervical or inguinal adenopathy. Neurologic: Grossly intact, no focal deficits, moving all 4 extremities. Psychiatric: Normal mood and affect.  Laboratory Data: Lab Results  Component Value Date   WBC 7.2 09/11/2017   HGB 12.4 09/11/2017   HCT 36.3 09/11/2017   MCV 93.4 09/11/2017   PLT 211 09/11/2017    Lab Results  Component Value Date   CREATININE 0.80 12/06/2017    Lab Results  Component Value Date   HGBA1C 5.8 (H) 12/04/2016    Lab Results  Component Value Date   TSH 0.42 12/10/2015     Lab Results  Component Value Date   AST 10 12/04/2016   Lab Results  Component Value Date   ALT 8 12/04/2016    Urinalysis Negative.  See EPIC.   I have reviewed the labs  Pertinent Imaging: Results for AREIL, OTTEY (MRN [id number]) as of 12/23/2017 20:10  Ref. Range 11/29/2016 14:35  Scan Result Unknown 0   Assessment & Plan:    1. History of hematuria  - She did complete a hematuria workup in 2015 and no GU pathology was discovered  -  Urinalysis, Complete - negative  - RTC in one year for UA  2. Vaginal atrophy:   Patient will continue the vaginal estrogen cream 3 nights weekly.  RTC in one year for an exam.    3. History of rUTI's Patient currently asymptomatic UA is bland Will return if she should experience UTI symptoms   Return in about 1 year (around 12/12/2018) for exam and UA.  These notes  generated with voice recognition software. I apologize for typographical errors.  Zara Council, PA-C  [facility] [address] [place], [place] [address] [phone]

## 2017-12-12 ENCOUNTER — Other Ambulatory Visit: Payer: Medicare Other

## 2017-12-12 LAB — MICROSCOPIC EXAMINATION
RBC, UA: NONE SEEN /hpf (ref 0–2)
WBC, UA: NONE SEEN /hpf (ref 0–5)

## 2017-12-12 LAB — URINALYSIS, COMPLETE
Bilirubin, UA: NEGATIVE
Glucose, UA: NEGATIVE
Ketones, UA: NEGATIVE
Leukocytes, UA: NEGATIVE
Nitrite, UA: NEGATIVE
Protein, UA: NEGATIVE
RBC, UA: NEGATIVE
Specific Gravity, UA: 1.02 (ref 1.005–1.030)
Urobilinogen, Ur: 0.2 mg/dL (ref 0.2–1.0)
pH, UA: 6.5 (ref 5.0–7.5)

## 2017-12-14 ENCOUNTER — Ambulatory Visit: Payer: Medicare Other | Admitting: Family Medicine

## 2017-12-17 ENCOUNTER — Encounter (INDEPENDENT_AMBULATORY_CARE_PROVIDER_SITE_OTHER): Payer: Self-pay | Admitting: Vascular Surgery

## 2017-12-17 ENCOUNTER — Ambulatory Visit (INDEPENDENT_AMBULATORY_CARE_PROVIDER_SITE_OTHER): Payer: Medicare Other | Admitting: Vascular Surgery

## 2017-12-17 VITALS — BP 147/66 | HR 64 | Resp 17 | Ht 64.0 in | Wt 216.8 lb

## 2017-12-17 DIAGNOSIS — I83813 Varicose veins of bilateral lower extremities with pain: Secondary | ICD-10-CM | POA: Diagnosis not present

## 2017-12-17 NOTE — Progress Notes (Signed)
Varicose veins of bilateral  lower extremity with inflammation (454.1  I83.10) Current Plans   Indication: Patient presents with symptomatic varicose veins of the bilateral  lower extremity.   Procedure: Sclerotherapy using hypertonic saline mixed with 1% Lidocaine was performed on the bilateral lower extremity. Compression wraps were placed. The patient tolerated the procedure well. 

## 2017-12-18 ENCOUNTER — Telehealth: Payer: Self-pay | Admitting: Rheumatology

## 2017-12-18 MED ORDER — GABAPENTIN 300 MG PO CAPS: ORAL_CAPSULE | ORAL | 0 refills | Status: DC

## 2017-12-18 NOTE — Telephone Encounter (Signed)
Last visit: 11/20/2017 Next visit: 05/23/2018  Okay to refill per Dr. Estanislado Pandy.

## 2017-12-18 NOTE — Telephone Encounter (Signed)
Patient needs a refill on Gabapentin sent to [facility].

## 2017-12-23 ENCOUNTER — Telehealth: Payer: Self-pay | Admitting: Urology

## 2017-12-23 NOTE — Telephone Encounter (Signed)
Please let Mrs. Jacqueline Lucas that her urine was clear of blood.  She needs a follow-up appointment in 1 year

## 2017-12-24 NOTE — Telephone Encounter (Signed)
Patient notified

## 2017-12-26 ENCOUNTER — Telehealth: Payer: Self-pay | Admitting: *Deleted

## 2017-12-26 ENCOUNTER — Encounter: Payer: Self-pay | Admitting: Family Medicine

## 2017-12-26 ENCOUNTER — Ambulatory Visit (INDEPENDENT_AMBULATORY_CARE_PROVIDER_SITE_OTHER): Payer: Medicare Other | Admitting: Family Medicine

## 2017-12-26 VITALS — BP 128/64 | HR 75 | Temp 98.1°F | Resp 16 | Ht 64.0 in | Wt 219.9 lb

## 2017-12-26 DIAGNOSIS — I471 Supraventricular tachycardia: Secondary | ICD-10-CM

## 2017-12-26 DIAGNOSIS — E781 Pure hyperglyceridemia: Secondary | ICD-10-CM | POA: Diagnosis not present

## 2017-12-26 DIAGNOSIS — G43109 Migraine with aura, not intractable, without status migrainosus: Secondary | ICD-10-CM

## 2017-12-26 DIAGNOSIS — F3160 Bipolar disorder, current episode mixed, unspecified: Secondary | ICD-10-CM

## 2017-12-26 DIAGNOSIS — E039 Hypothyroidism, unspecified: Secondary | ICD-10-CM | POA: Diagnosis not present

## 2017-12-26 DIAGNOSIS — M797 Fibromyalgia: Secondary | ICD-10-CM | POA: Diagnosis not present

## 2017-12-26 DIAGNOSIS — K219 Gastro-esophageal reflux disease without esophagitis: Secondary | ICD-10-CM

## 2017-12-26 DIAGNOSIS — F411 Generalized anxiety disorder: Secondary | ICD-10-CM

## 2017-12-26 DIAGNOSIS — R7303 Prediabetes: Secondary | ICD-10-CM

## 2017-12-26 DIAGNOSIS — I10 Essential (primary) hypertension: Secondary | ICD-10-CM

## 2017-12-26 DIAGNOSIS — M810 Age-related osteoporosis without current pathological fracture: Secondary | ICD-10-CM | POA: Diagnosis not present

## 2017-12-26 DIAGNOSIS — Z23 Encounter for immunization: Secondary | ICD-10-CM | POA: Diagnosis not present

## 2017-12-26 DIAGNOSIS — J449 Chronic obstructive pulmonary disease, unspecified: Secondary | ICD-10-CM

## 2017-12-26 DIAGNOSIS — F330 Major depressive disorder, recurrent, mild: Secondary | ICD-10-CM | POA: Diagnosis not present

## 2017-12-26 DIAGNOSIS — E785 Hyperlipidemia, unspecified: Secondary | ICD-10-CM

## 2017-12-26 DIAGNOSIS — I209 Angina pectoris, unspecified: Secondary | ICD-10-CM

## 2017-12-26 DIAGNOSIS — J302 Other seasonal allergic rhinitis: Secondary | ICD-10-CM

## 2017-12-26 DIAGNOSIS — R739 Hyperglycemia, unspecified: Secondary | ICD-10-CM | POA: Diagnosis not present

## 2017-12-26 MED ORDER — OMEGA-3-ACID ETHYL ESTERS 1 G PO CAPS: 2.0000 g | ORAL_CAPSULE | Freq: Every day | ORAL | 1 refills | Status: DC

## 2017-12-26 MED ORDER — OLMESARTAN MEDOXOMIL-HCTZ 40-12.5 MG PO TABS: 1.0000 | ORAL_TABLET | Freq: Every day | ORAL | 1 refills | Status: DC

## 2017-12-26 MED ORDER — METOPROLOL TARTRATE 25 MG PO TABS: 25.0000 mg | ORAL_TABLET | Freq: Two times a day (BID) | ORAL | 1 refills | Status: DC

## 2017-12-26 MED ORDER — DIVALPROEX SODIUM 250 MG PO DR TAB: 250.0000 mg | DELAYED_RELEASE_TABLET | Freq: Two times a day (BID) | ORAL | 0 refills | Status: DC

## 2017-12-26 MED ORDER — MONTELUKAST SODIUM 10 MG PO TABS: 10.0000 mg | ORAL_TABLET | Freq: Every day | ORAL | 1 refills | Status: DC

## 2017-12-26 MED ORDER — ATORVASTATIN CALCIUM 40 MG PO TABS: ORAL_TABLET | ORAL | 0 refills | Status: DC

## 2017-12-26 NOTE — Progress Notes (Signed)
Name: Jacqueline Lucas   MRN: [id number]    DOB: 1950/09/01   Date:12/26/2017       Progress Note  Subjective  Chief Complaint  Chief Complaint  Patient presents with  . Follow-up    4 mth f/u  . Asthma  . Manic Behavior  . Chest Pain  . Hypothyroidism  . Anxiety  . Migraine  . Fibroids  . Gastroesophageal Reflux  . Tachycardia  . Pain  . Hypertension  . Allergic Rhinitis   . osteopenia  . Hyperglycemia  . hypertriglyderidemia    HPI  HTN: she is takingBenicar - 40-12.5, mteoprolol,and is doing well,she has episodes of chest pain - sometimes feels like muscular pain, nopalpitation. BP at previous provider was elevated but usually improves with rest.   Bipolar disorder: she is doing well on Depakote and Effexor, denies mania, but she states did not sleep well for the past two nights - she states not because of mania but because she had to get early to go to [place] with husband. Her husband is here  and states she is doing well. She has not been seen by psychiatrist in a long time and not interested in getting referral. She states she has episodes of anger outburst about once a month but only lasts minutes  Migraine headaches: used to go to the headache clinic, but states she was taking too many medications. She states episodes not as frequent now . She is taking Gabapentin. She also has episodes of cluster headaches intermittent, but not as frequent, episodes about once a week and not lasting as long , she has gone to [facility] neurology but not recently   FMS: seeing Dr. Olegario Messier on muscle relaxer and gabapentin. She states symptoms worse with weather changes.She still aches all over pain 4/10, her legs have been more painful , had injections on trochanteric bursa by [facility] recently but did not work this time.   Dyslipidemia: currently on lipitor only, off Tricorbut triglycerides very high, she is on lovaza at this time, no side effects of  medication.  Goiter: having dysphagia, and is contemplating surgery, she was seen by Dr. Ezequiel Ganser the past, but switched to Dr. Gabriel Carina and was given reassurance, no need for biopsy at this time.   COPD mild: from second hand smoking, taking Duleraand singulair.She has occasional cough and wheezing, she has noticed she has been breathing better since humidity is not so high now.   Back Pain:she is seeing Ortho Dr. Marlou Sa and has been having procedures done. Right wrist, also has one screw from previous surgery that is broken and seen by Dr. Retia Passe at [facility] - who did her surgeries before . She will go back if needed. Pain is lumbar spine can be intense when moving, but zero/10 pain while sitting in our office resting.    Angina/ History  of SVT: doing well, on metoprolol, statin, and aspirin, sees Dr. Clayborn Bigness yearly but has not been there in a while  No recent episodes of chest pain   Obesity & Hyperglycemia: shehas hyperglycemia./insuilin resistance, She has some metformin at home but caused diarrhea and she stopped it. She has lost 5 lbs since last visit. Discussed portion control    Patient Active Problem List   Diagnosis Date Noted  . Hx of hysterectomy 08/13/2017  . Pain in left wrist 06/12/2017  . Chronic venous insufficiency 06/09/2017  . Varicose veins of both lower extremities with pain 04/30/2017  . Claudication ([place]) 04/30/2017  .  Arthritis of carpometacarpal [facility]) joint of left thumb 05/12/2016  . Bilateral thumb pain 05/01/2016  . Chronic bronchitis ([place]) 04/14/2016  . Anemia 02/03/2016  . Status post bilateral total hip replacement 01/10/2016  . Sicca ([place]) 01/10/2016  . Age related osteoporosis 01/10/2016  . Hip arthritis 08/16/2015  . Microscopic hematuria 07/30/2015  . Vaginal atrophy 07/30/2015  . GAD (generalized anxiety disorder) 07/08/2015  . Moderate episode of recurrent major depressive disorder ([place]) 07/08/2015  . Migraine  with aura and without status migrainosus, not intractable 07/08/2015  . Dyslipidemia 07/08/2015  . Degenerative arthritis of right knee 03/16/2015  . Atrophic vaginitis 01/31/2015  . Recurrent UTI 12/31/2014  . IBS (irritable bowel syndrome) 10/29/2014  . Asthma, moderate persistent 09/08/2014  . Allergic state 09/07/2014  . Colon polyp 09/07/2014  . Hemorrhoid 09/07/2014  . Constrictive tenosynovitis 03/12/2014  . H/O total knee replacement, bilateral 07/31/2013  . CAD (coronary artery disease) 11/28/2012  . Anxiety 11/28/2012  . Acid reflux 11/28/2012  . Cardiac murmur 11/28/2012  . BP (high blood pressure) 11/28/2012  . Cannot sleep 11/28/2012  . Adiposity 11/28/2012  . Arthritis, degenerative 11/28/2012  . Restless leg 11/28/2012  . Supraventricular tachycardia ([place]) 11/28/2012  . Fibromyalgia 11/28/2012  . Tachycardia 11/28/2012    Past Surgical History:  Procedure Laterality Date  . ABDOMINAL HYSTERECTOMY     due to cancer of ovaries  . BACK SURGERY N/A 2014   x 5  . CARDIAC CATHETERIZATION     no PCI; 12/18/12 ([facility], Dr. Marcello Moores): EP study with ablation. No coronary angiography done then.  . COLONOSCOPY W/ POLYPECTOMY    . COLONOSCOPY WITH PROPOFOL N/A 02/04/2016   Procedure: COLONOSCOPY WITH PROPOFOL;  Surgeon: Robert Bellow, MD;  Location: [facility] ENDOSCOPY;  Service: Endoscopy;  Laterality: N/A;  . ESOPHAGOGASTRODUODENOSCOPY (EGD) WITH PROPOFOL N/A 02/04/2016   Procedure: ESOPHAGOGASTRODUODENOSCOPY (EGD) WITH PROPOFOL;  Surgeon: Robert Bellow, MD;  Location: [facility] ENDOSCOPY;  Service: Endoscopy;  Laterality: N/A;  . FINGER ARTHROSCOPY WITH CARPOMETACARPEL ([place]) ARTHROPLASTY Left 05/01/2016   Dr. Erlinda Hong  . GIVENS CAPSULE STUDY N/A 05/02/2016   Procedure: GIVENS CAPSULE STUDY;  Surgeon: Manya Silvas, MD;  Location: [facility] ENDOSCOPY;  Service: Endoscopy;  Laterality: N/A;  . HAND SURGERY    . HEMORRHOID SURGERY    . HIP ARTHROPLASTY    . JOINT REPLACEMENT Bilateral     knee  . KNEE ARTHROPLASTY    . REPLACEMENT TOTAL KNEE Left   . SPINE SURGERY     x 2  . tendonititis     right elbow, right wrist  . TOTAL HIP ARTHROPLASTY Left 2013  . TOTAL HIP ARTHROPLASTY Right 08/16/2015   Procedure: TOTAL HIP ARTHROPLASTY ANTERIOR APPROACH;  Surgeon: Meredith Pel, MD;  Location: [place];  Service: Orthopedics;  Laterality: Right;  . TOTAL KNEE ARTHROPLASTY Right 03/16/2015   Procedure: RIGHT TOTAL KNEE ARTHROPLASTY, PCL SACRIFICING;  Surgeon: Meredith Pel, MD;  Location: [place];  Service: Orthopedics;  Laterality: Right;    Family History  Problem Relation Age of Onset  . Diabetes Mother   . Hypertension Mother   . Heart disease Mother   . Alzheimer's disease Mother   . Cancer Father   . Diabetes Father   . Hypertension Sister   . Depression Sister   . Fibromyalgia Sister   . Cancer Brother   . Heart disease Brother   . Stroke Son   . Fibromyalgia Daughter   . Bladder Cancer Neg Hx   .  Kidney disease Neg Hx   . Kidney cancer Neg Hx     Social History   Socioeconomic History  . Marital status: Married    Spouse name: Fritz Pickerel  . Number of children: 4  . Years of education: some college  . Highest education level: 12th grade  Occupational History  . Occupation: Retired  Scientific laboratory technician  . Financial resource strain: Not hard at all  . Food insecurity:    Worry: Never true    Inability: Never true  . Transportation needs:    Medical: No    Non-medical: No  Tobacco Use  . Smoking status: Never Smoker  . Smokeless tobacco: Never Used  . Tobacco comment: smoking cessation materials not required  Substance and Sexual Activity  . Alcohol use: No    Alcohol/week: 0.0 standard drinks  . Drug use: No  . Sexual activity: Never  Lifestyle  . Physical activity:    Days per week: 3 days    Minutes per session: 30 min  . Stress: Not at all  Relationships  . Social connections:    Talks on phone: More than three times a week    Gets  together: Twice a week    Attends religious service: 1 to 4 times per year    Active member of club or organization: No    Attends meetings of clubs or organizations: Never    Relationship status: Married  . Intimate partner violence:    Fear of current or ex partner: No    Emotionally abused: No    Physically abused: No    Forced sexual activity: No  Other Topics Concern  . Not on file  Social History Narrative   Patient has 10 grandchildren, 4 great-granddaughters & 2 great-grandsons.     Current Outpatient Medications:  .  aspirin 81 MG chewable tablet, Chew 81 mg by mouth daily., Disp: , Rfl:  .  atorvastatin (LIPITOR) 40 MG tablet, TAKE 1 TABLET BY MOUTH EVERYDAY AT BEDTIME, Disp: 90 tablet, Rfl: 0 .  baclofen (LIORESAL) 10 MG tablet, TAKE ONE TABLET AT 7AM AND ONE TABLET AT 2PM AS NEEDED FOR MUSCLE SPASM, Disp: 180 tablet, Rfl: 1 .  Cholecalciferol (D3-1000) 1000 units tablet, Take 1,000 Units by mouth at bedtime. , Disp: , Rfl:  .  divalproex (DEPAKOTE) 250 MG DR tablet, TAKE 1 TABLET TWICE A DAY, Disp: 180 tablet, Rfl: 0 .  estradiol (ESTRACE) 0.1 MG/GM vaginal cream, FOR DIRECTIONS ON HOW TO   TAKE THIS MEDICINE, READ   THE ENCLOSED MEDICATION    INFORMATION FORM, Disp: 42.5 g, Rfl: 3 .  gabapentin (NEURONTIN) 300 MG capsule, TAKE 1 CAPSULE BY MOUTH 3 TIMES A DAY, Disp: 270 capsule, Rfl: 0 .  lansoprazole (PREVACID) 30 MG capsule, Take 1 capsule (30 mg total) daily by mouth., Disp: 90 capsule, Rfl: 1 .  levalbuterol (XOPENEX) 1.25 MG/3ML nebulizer solution, Take 1.25 mg by nebulization every 4 (four) hours as needed for wheezing., Disp: 72 mL, Rfl: 4 .  levothyroxine (SYNTHROID, LEVOTHROID) 50 MCG tablet, Take 50 mcg by mouth daily before breakfast., Disp: , Rfl:  .  magnesium oxide (MAG-OX) 400 MG tablet, Take 400 mg by mouth at bedtime. Reported on 07/28/2015, Disp: , Rfl:  .  metoprolol tartrate (LOPRESSOR) 25 MG tablet, TAKE 1 TABLET BY MOUTH TWICE A DAY, Disp: 180 tablet,  Rfl: 1 .  mometasone-formoterol (DULERA) 200-5 MCG/ACT AERO, Inhale 2 puffs into the lungs 2 (two) times daily. Reported on 07/28/2015, Disp:  3 Inhaler, Rfl: 0 .  montelukast (SINGULAIR) 10 MG tablet, Take 1 tablet (10 mg total) by mouth at bedtime., Disp: 90 tablet, Rfl: 1 .  nystatin cream (MYCOSTATIN), APPLY TO CORNERS OF MOUTH 3 TIMES DAILY AFTER EATING, Disp: , Rfl: 0 .  olmesartan-hydrochlorothiazide (BENICAR HCT) 40-12.5 MG tablet, Take 1 tablet by mouth daily., Disp: 90 tablet, Rfl: 1 .  omega-3 acid ethyl esters (LOVAZA) 1 g capsule, Take 2 capsules (2 g total) by mouth daily., Disp: 360 capsule, Rfl: 1 .  ondansetron (ZOFRAN) 4 MG tablet, Take 1 tablet (4 mg total) by mouth every 8 (eight) hours as needed for nausea or vomiting., Disp: 20 tablet, Rfl: 0 .  polyethylene glycol powder (GLYCOLAX/MIRALAX) powder, 255 grams one bottle for capsule endoscopy prep (Patient taking differently: as needed. ), Disp: 255 g, Rfl: 0 .  venlafaxine XR (EFFEXOR-XR) 150 MG 24 hr capsule, Take 1 capsule (150 mg total) by mouth daily with breakfast., Disp: 90 capsule, Rfl: 1 .  vitamin B-12 (CYANOCOBALAMIN) 1000 MCG tablet, Take 1,000 mcg by mouth at bedtime., Disp: , Rfl:   Allergies  Allergen Reactions  . Morphine Nausea And Vomiting    Ok with nausea medication  . Diazepam Nausea And Vomiting  . Lyrica [Pregabalin] Other (See Comments)    Joint swelling  . Metformin And Related     diarrhea  . Latex Rash  . Rash Away  [Petrolatum-Zinc Oxide] Rash and Swelling  . Salicylates Other (See Comments)    Other reaction(s): Headache  . Tape Rash    Please use paper tape    I personally reviewed active problem list, medication list, allergies, family history, social history with the patient/caregiver today.   ROS  Constitutional: Negative for fever or weight change.  Respiratory: Negative for cough and shortness of breath.   Cardiovascular: Negative for chest pain or palpitations.   Gastrointestinal: Negative for abdominal pain, no bowel changes.  Musculoskeletal: Negative for gait problem or joint swelling.  Skin: Negative for rash.  Neurological: Negative for dizziness or headache.  No other specific complaints in a complete review of systems (except as listed in HPI above).  Objective  Vitals:   12/26/17 1041  BP: 128/64  Pulse: 75  Resp: 16  Temp: 98.1 F (36.7 C)  TempSrc: Oral  SpO2: 98%  Weight: 219 lb 14.4 oz (99.7 kg)  Height: 5\' 4"  (1.626 m)    Body mass index is 37.75 kg/m.  Physical Exam  Constitutional: Patient appears well-developed and well-nourished. Obese  No distress.  HEENT: head atraumatic, normocephalic, pupils equal and reactive to light,neck supple, throat within normal limits Cardiovascular: Normal rate, regular rhythm and normal heart sounds.  No murmur heard. No BLE edema. Pulmonary/Chest: Effort normal and breath sounds normal. No respiratory distress. Abdominal: Soft.  There is no tenderness. Psychiatric: Patient has a normal mood and affect. behavior is normal. Judgment and thought content normal. Muscular skeletal: slow gait and trigger point positive   Recent Results (from the past 2160 hour(s))  I-STAT creatinine     Status: None   Collection Time: 12/06/17  3:27 PM  Result Value Ref Range   Creatinine, Ser 0.80 0.44 - 1.00 mg/dL  Urinalysis, Complete     Status: None   Collection Time: 12/11/17  1:35 PM  Result Value Ref Range   Specific Gravity, UA 1.020 1.005 - 1.030   pH, UA 6.5 5.0 - 7.5   Color, UA Yellow Yellow   Appearance Ur Clear Clear  Leukocytes, UA Negative Negative   Protein, UA Negative Negative/Trace   Glucose, UA Negative Negative   Ketones, UA Negative Negative   RBC, UA Negative Negative   Bilirubin, UA Negative Negative   Urobilinogen, Ur 0.2 0.2 - 1.0 mg/dL   Nitrite, UA Negative Negative   Microscopic Examination See below:   Microscopic Examination     Status: Abnormal   Collection  Time: 12/11/17  1:35 PM  Result Value Ref Range   WBC, UA None seen 0 - 5 /hpf   RBC, UA None seen 0 - 2 /hpf   Epithelial Cells (non renal) 0-10 0 - 10 /hpf   Mucus, UA Present (A) Not Estab.   Bacteria, UA Few (A) None seen/Few     PHQ2/9: Depression screen [facility] 2/9 12/26/2017 10/08/2017 09/14/2017 08/17/2017 08/13/2017  Decreased Interest 0 0 - 0 1  Down, Depressed, Hopeless 0 0 - 0 1  PHQ - 2 Score 0 0 - 0 2  Altered sleeping 0 - 0 0 0  Tired, decreased energy 0 - 0 0 0  Change in appetite 0 - 0 0 0  Feeling bad or failure about yourself  0 - 0 0 0  Trouble concentrating 0 - 0 0 0  Moving slowly or fidgety/restless 0 - 0 0 0  Suicidal thoughts 0 - 0 0 0  PHQ-9 Score 0 - - 0 2  Difficult doing work/chores Not difficult at all - Not difficult at all Not difficult at all Not difficult at all  Some recent data might be hidden     Fall Risk: Fall Risk  12/26/2017 10/08/2017 09/14/2017 08/17/2017 08/13/2017  Falls in the past year? No No No No No  Number falls in past yr: - - - - -  Injury with Fall? - - - - -  Risk Factor Category  - - - - -  Risk for fall due to : - - Impaired balance/gait;Impaired vision Impaired vision;Impaired balance/gait;Medication side effect;History of fall(s);Other (Comment) -  Risk for fall due to: Comment - - - wears eyeglasses; syncope, fibromyalgia -  Follow up - - - - -     Functional Status Survey: Is the patient deaf or have difficulty hearing?: No Does the patient have difficulty seeing, even when wearing glasses/contacts?: No Does the patient have difficulty concentrating, remembering, or making decisions?: No Does the patient have difficulty walking or climbing stairs?: Yes Does the patient have difficulty dressing or bathing?: No Does the patient have difficulty doing errands alone such as visiting a doctor's office or shopping?: No    Assessment & Plan  1. Angina pectoris ([place])  Stable   2. Need for influenza vaccination  - Flu  vaccine HIGH DOSE PF  3. Asthma-COPD overlap syndrome ([place])   4. Adult hypothyroidism  Seeing Dr. Gabriel Carina  5. Pre-diabetes   6. GAD (generalized anxiety disorder)  Stable  7. Supraventricular tachycardia ([facility])  No recent episodes  8. Fibromyalgia  Sees Dr. Jefferson Fuel  9. Migraine with aura and without status migrainosus, not intractable  - metoprolol tartrate (LOPRESSOR) 25 MG tablet; Take 1 tablet (25 mg total) by mouth 2 (two) times daily.  Dispense: 180 tablet; Refill: 1  10. Essential hypertension  - metoprolol tartrate (LOPRESSOR) 25 MG tablet; Take 1 tablet (25 mg total) by mouth 2 (two) times daily.  Dispense: 180 tablet; Refill: 1 - olmesartan-hydrochlorothiazide (BENICAR HCT) 40-12.5 MG tablet; Take 1 tablet by mouth daily.  Dispense: 90 tablet; Refill: 1  11.  Mild episode of recurrent major depressive disorder ([place])   12. Hypertriglyceridemia   13. Gastroesophageal reflux disease without esophagitis   14. Bipolar 1 disorder, mixed ([facility])  - divalproex (DEPAKOTE) 250 MG DR tablet; Take 1 tablet (250 mg total) by mouth 2 (two) times daily.  Dispense: 180 tablet; Refill: 0  15. Dyslipidemia  - atorvastatin (LIPITOR) 40 MG tablet; TAKE 1 TABLET BY MOUTH EVERYDAY AT BEDTIME  Dispense: 90 tablet; Refill: 0 - omega-3 acid ethyl esters (LOVAZA) 1 g capsule; Take 2 capsules (2 g total) by mouth daily.  Dispense: 360 capsule; Refill: 1  16. Chronic seasonal allergic rhinitis  - montelukast (SINGULAIR) 10 MG tablet; Take 1 tablet (10 mg total) by mouth at bedtime.  Dispense: 90 tablet; Refill: 1

## 2017-12-26 NOTE — Telephone Encounter (Signed)
Spoke with patient and she received her medication from the pharmacy and the prescription was for Gabapentin 300 mg 1 capsule 3 times daily. Patient takes gabapentin 3 capsules 3 times daily. Advised patient to take the medication as she has been taking it  3 capsules 3 times daily and to contact the office when she needs to have a refill and we will send in for a new prescription. Patient verbalized understanding.

## 2017-12-27 LAB — PARATHYROID HORMONE, INTACT (NO CA): PTH: 29 pg/mL (ref 14–64)

## 2017-12-27 LAB — COMPLETE METABOLIC PANEL WITH GFR
AG Ratio: 2 (calc) (ref 1.0–2.5)
ALT: 13 U/L (ref 6–29)
AST: 12 U/L (ref 10–35)
Albumin: 3.9 g/dL (ref 3.6–5.1)
Alkaline phosphatase (APISO): 51 U/L (ref 33–130)
BUN: 17 mg/dL (ref 7–25)
CO2: 28 mmol/L (ref 20–32)
Calcium: 9.1 mg/dL (ref 8.6–10.4)
Chloride: 104 mmol/L (ref 98–110)
Creat: 0.89 mg/dL (ref 0.50–0.99)
GFR, Est African American: 77 mL/min/{1.73_m2} (ref >=60)
GFR, Est Non African American: 67 mL/min/{1.73_m2} (ref >=60)
Globulin: 2 g/dL (calc) (ref 1.9–3.7)
Glucose, Bld: 81 mg/dL (ref 65–139)
Potassium: 4 mmol/L (ref 3.5–5.3)
Sodium: 139 mmol/L (ref 135–146)
Total Bilirubin: 0.4 mg/dL (ref 0.2–1.2)
Total Protein: 5.9 g/dL — ABNORMAL LOW (ref 6.1–8.1)

## 2017-12-27 LAB — CBC WITH DIFFERENTIAL/PLATELET
Basophils Absolute: 40 cells/uL (ref 0–200)
Basophils Relative: 0.6 %
Eosinophils Absolute: 132 cells/uL (ref 15–500)
Eosinophils Relative: 2 %
HCT: 33.9 % — ABNORMAL LOW (ref 35.0–45.0)
Hemoglobin: 11.8 g/dL (ref 11.7–15.5)
Lymphs Abs: 1762 cells/uL (ref 850–3900)
MCH: 30.6 pg (ref 27.0–33.0)
MCHC: 34.8 g/dL (ref 32.0–36.0)
MCV: 87.8 fL (ref 80.0–100.0)
MPV: 10.6 fL (ref 7.5–12.5)
Monocytes Relative: 9.6 %
Neutro Abs: 4033 cells/uL (ref 1500–7800)
Neutrophils Relative %: 61.1 %
Platelets: 229 10*3/uL (ref 140–400)
RBC: 3.86 10*6/uL (ref 3.80–5.10)
RDW: 14.9 % (ref 11.0–15.0)
Total Lymphocyte: 26.7 %
WBC mixed population: 634 cells/uL (ref 200–950)
WBC: 6.6 10*3/uL (ref 3.8–10.8)

## 2017-12-27 LAB — LIPID PANEL
Cholesterol: 189 mg/dL (ref <200)
HDL: 51 mg/dL (ref >50)
LDL Cholesterol (Calc): 88 mg/dL (calc)
Non-HDL Cholesterol (Calc): 138 mg/dL (calc) — ABNORMAL HIGH (ref <130)
Total CHOL/HDL Ratio: 3.7 (calc) (ref <5.0)
Triglycerides: 378 mg/dL — ABNORMAL HIGH (ref <150)

## 2017-12-27 LAB — HEMOGLOBIN A1C
Hgb A1c MFr Bld: 6.4 % of total Hgb — ABNORMAL HIGH (ref <5.7)
Mean Plasma Glucose: 137 (calc)
eAG (mmol/L): 7.6 (calc)

## 2017-12-27 LAB — VITAMIN D 25 HYDROXY (VIT D DEFICIENCY, FRACTURES): Vit D, 25-Hydroxy: 27 ng/mL — ABNORMAL LOW (ref 30–100)

## 2017-12-28 HISTORY — PX: BACK SURGERY: SHX140

## 2018-01-05 ENCOUNTER — Emergency Department
Admission: EM | Admit: 2018-01-05 | Discharge: 2018-01-06 | Disposition: A | Discharge status: Other Acute Inpatient Hospital | Payer: Medicare Other | Attending: Emergency Medicine | Admitting: Emergency Medicine

## 2018-01-05 ENCOUNTER — Encounter: Payer: Self-pay | Admitting: Emergency Medicine

## 2018-01-05 ENCOUNTER — Emergency Department: Payer: Medicare Other

## 2018-01-05 ENCOUNTER — Other Ambulatory Visit: Payer: Self-pay

## 2018-01-05 DIAGNOSIS — F329 Major depressive disorder, single episode, unspecified: Secondary | ICD-10-CM | POA: Diagnosis not present

## 2018-01-05 DIAGNOSIS — G834 Cauda equina syndrome: Secondary | ICD-10-CM

## 2018-01-05 DIAGNOSIS — Z91040 Latex allergy status: Secondary | ICD-10-CM | POA: Diagnosis not present

## 2018-01-05 DIAGNOSIS — Z96643 Presence of artificial hip joint, bilateral: Secondary | ICD-10-CM | POA: Insufficient documentation

## 2018-01-05 DIAGNOSIS — M549 Dorsalgia, unspecified: Secondary | ICD-10-CM | POA: Diagnosis not present

## 2018-01-05 DIAGNOSIS — M5126 Other intervertebral disc displacement, lumbar region: Secondary | ICD-10-CM | POA: Diagnosis not present

## 2018-01-05 DIAGNOSIS — I10 Essential (primary) hypertension: Secondary | ICD-10-CM | POA: Diagnosis not present

## 2018-01-05 DIAGNOSIS — E039 Hypothyroidism, unspecified: Secondary | ICD-10-CM | POA: Insufficient documentation

## 2018-01-05 DIAGNOSIS — Z96653 Presence of artificial knee joint, bilateral: Secondary | ICD-10-CM | POA: Diagnosis not present

## 2018-01-05 DIAGNOSIS — J45909 Unspecified asthma, uncomplicated: Secondary | ICD-10-CM | POA: Diagnosis not present

## 2018-01-05 DIAGNOSIS — Z7982 Long term (current) use of aspirin: Secondary | ICD-10-CM | POA: Insufficient documentation

## 2018-01-05 DIAGNOSIS — M545 Low back pain: Secondary | ICD-10-CM | POA: Diagnosis not present

## 2018-01-05 DIAGNOSIS — Z79899 Other long term (current) drug therapy: Secondary | ICD-10-CM | POA: Diagnosis not present

## 2018-01-05 DIAGNOSIS — F419 Anxiety disorder, unspecified: Secondary | ICD-10-CM | POA: Diagnosis not present

## 2018-01-05 LAB — URINALYSIS, COMPLETE (UACMP) WITH MICROSCOPIC
Bacteria, UA: NONE SEEN
Bilirubin Urine: NEGATIVE
Glucose, UA: NEGATIVE mg/dL
Hgb urine dipstick: NEGATIVE
Ketones, ur: NEGATIVE mg/dL
Leukocytes, UA: NEGATIVE
Nitrite: NEGATIVE
Protein, ur: NEGATIVE mg/dL
Specific Gravity, Urine: 1.013 (ref 1.005–1.030)
pH: 7 (ref 5.0–8.0)

## 2018-01-05 LAB — CBC
HCT: 39.9 % (ref 36.0–46.0)
Hemoglobin: 13.1 g/dL (ref 12.0–15.0)
MCH: 30.8 pg (ref 26.0–34.0)
MCHC: 32.8 g/dL (ref 30.0–36.0)
MCV: 93.9 fL (ref 80.0–100.0)
Platelets: 236 10*3/uL (ref 150–400)
RBC: 4.25 MIL/uL (ref 3.87–5.11)
RDW: 15.1 % (ref 11.5–15.5)
WBC: 5.7 10*3/uL (ref 4.0–10.5)
nRBC: 0 % (ref 0.0–0.2)

## 2018-01-05 LAB — BASIC METABOLIC PANEL
Anion gap: 8 (ref 5–15)
BUN: 20 mg/dL (ref 8–23)
CO2: 27 mmol/L (ref 22–32)
Calcium: 9 mg/dL (ref 8.9–10.3)
Chloride: 101 mmol/L (ref 98–111)
Creatinine, Ser: 0.91 mg/dL (ref 0.44–1.00)
GFR calc Af Amer: >60 mL/min (ref >60)
GFR calc non Af Amer: >60 mL/min (ref >60)
Glucose, Bld: 127 mg/dL — ABNORMAL HIGH (ref 70–99)
Potassium: 4.2 mmol/L (ref 3.5–5.1)
Sodium: 136 mmol/L (ref 135–145)

## 2018-01-05 MED ORDER — HYDROMORPHONE HCL 1 MG/ML IJ SOLN
1.0000 mg | Freq: Once | INTRAMUSCULAR | Status: AC
  Administered 2018-01-05: 1 mg via INTRAVENOUS
  Filled 2018-01-05: qty 1

## 2018-01-05 MED ORDER — MIDAZOLAM HCL 5 MG/5ML IJ SOLN
INTRAMUSCULAR | Status: AC
  Filled 2018-01-05: qty 5

## 2018-01-05 MED ORDER — MIDAZOLAM HCL 5 MG/5ML IJ SOLN
1.0000 mg | Freq: Once | INTRAMUSCULAR | Status: AC
  Administered 2018-01-05: 1 mg via INTRAVENOUS

## 2018-01-05 MED ORDER — ONDANSETRON HCL 4 MG/2ML IJ SOLN
4.0000 mg | Freq: Once | INTRAMUSCULAR | Status: AC
  Administered 2018-01-05: 4 mg via INTRAVENOUS
  Filled 2018-01-05: qty 2

## 2018-01-05 NOTE — ED Notes (Signed)
Pt returned to ED Rm 9 from MRI at this time.

## 2018-01-05 NOTE — ED Notes (Signed)
Pt ambulatory to BR to void at this time without assistance. Pt husband at her side and states he will help her, if needed.

## 2018-01-05 NOTE — ED Provider Notes (Signed)
[facility] Emergency Department Provider Note   ____________________________________________    I have reviewed the triage vital signs and the nursing notes.   HISTORY  Chief Complaint Back pain    HPI Jacqueline Lucas is a 67 y.o. female with significant past medical history listed below, who presents with complaints of back pain.  Patient has a history of chronic back pain, apparently has had 3 surgeries for spinal stenosis.  She reports over the last 10 days she has had worsening back pain traveling from her right to left low back.  Over the last 2 days this is worsened even more.  She has had incontinence of urine which is new and today she has a sensation that the left side of her groin is asleep.  Husband called their surgeon in [place] who recommended MRI and ED.  No fevers or chills.  Past Medical History:  Diagnosis Date  . Allergy   . Allergy to adhesive tape    Allergy to paper tape as well  . Alzheimer disease ([place])   . Anemia   . Anginal pain ([place])   . Anxiety   . Arthritis   . Asthma   . Bronchitis   . Cataract   . Chronic nausea   . Claustrophobia   . DDD (degenerative disc disease), lumbar   . Depression   . Dry eyes    uses Restasis  . Fibromyalgia   . GERD (gastroesophageal reflux disease)   . H/O bladder infections   . Heart murmur   . Hematuria   . Hyperlipidemia   . Hypertension   . Hypothyroidism   . IBS (irritable bowel syndrome)   . Insomnia   . Lumbar neuritis   . Migraines   . Obesity   . Osteoporosis   . Palpitation   . PONV (postoperative nausea and vomiting)   . RLS (restless legs syndrome)   . Shoulder fracture, right   . Sicca ([place])   . Tachycardia   . Thyroid disease     Patient Active Problem List   Diagnosis Date Noted  . Hx of hysterectomy 08/13/2017  . Pain in left wrist 06/12/2017  . Chronic venous insufficiency 06/09/2017  . Varicose veins of both lower extremities with pain  04/30/2017  . Claudication ([place]) 04/30/2017  . Arthritis of carpometacarpal [facility]) joint of left thumb 05/12/2016  . Bilateral thumb pain 05/01/2016  . Chronic bronchitis ([place]) 04/14/2016  . Anemia 02/03/2016  . Status post bilateral total hip replacement 01/10/2016  . Sicca ([place]) 01/10/2016  . Age related osteoporosis 01/10/2016  . Hip arthritis 08/16/2015  . Microscopic hematuria 07/30/2015  . Vaginal atrophy 07/30/2015  . GAD (generalized anxiety disorder) 07/08/2015  . Moderate episode of recurrent major depressive disorder ([place]) 07/08/2015  . Migraine with aura and without status migrainosus, not intractable 07/08/2015  . Dyslipidemia 07/08/2015  . Degenerative arthritis of right knee 03/16/2015  . Atrophic vaginitis 01/31/2015  . Recurrent UTI 12/31/2014  . IBS (irritable bowel syndrome) 10/29/2014  . Asthma, moderate persistent 09/08/2014  . Allergic state 09/07/2014  . Colon polyp 09/07/2014  . Hemorrhoid 09/07/2014  . Constrictive tenosynovitis 03/12/2014  . H/O total knee replacement, bilateral 07/31/2013  . CAD (coronary artery disease) 11/28/2012  . Anxiety 11/28/2012  . Acid reflux 11/28/2012  . Cardiac murmur 11/28/2012  . BP (high blood pressure) 11/28/2012  . Cannot sleep 11/28/2012  . Adiposity 11/28/2012  . Arthritis, degenerative 11/28/2012  . Restless leg 11/28/2012  .  Supraventricular tachycardia ([place]) 11/28/2012  . Fibromyalgia 11/28/2012  . Tachycardia 11/28/2012    Past Surgical History:  Procedure Laterality Date  . ABDOMINAL HYSTERECTOMY     due to cancer of ovaries  . BACK SURGERY N/A 2014   x 5  . CARDIAC CATHETERIZATION     no PCI; 12/18/12 ([facility], Dr. Marcello Moores): EP study with ablation. No coronary angiography done then.  . COLONOSCOPY W/ POLYPECTOMY    . COLONOSCOPY WITH PROPOFOL N/A 02/04/2016   Procedure: COLONOSCOPY WITH PROPOFOL;  Surgeon: Dmarion Perfect Bellow, MD;  Location[facility] ENDOSCOPY;  Service: Endoscopy;  Laterality: N/A;  .  ESOPHAGOGASTRODUODENOSCOPY (EGD) WITH PROPOFOL N/A 02/04/2016   Procedure: ESOPHAGOGASTRODUODENOSCOPY (EGD) WITH PROPOFOL;  Surgeon: Mohmmad Saleeby Bellow, MD;  Location: [facility] ENDOSCOPY;  Service: Endoscopy;  Laterality: N/A;  . FINGER ARTHROSCOPY WITH CARPOMETACARPEL ([place]) ARTHROPLASTY Left 05/01/2016   Dr. Erlinda Hong  . GIVENS CAPSULE STUDY N/A 05/02/2016   Procedure: GIVENS CAPSULE STUDY;  Surgeon: Manya Silvas, MD;  Location: [facility] ENDOSCOPY;  Service: Endoscopy;  Laterality: N/A;  . HAND SURGERY    . HEMORRHOID SURGERY    . HIP ARTHROPLASTY    . JOINT REPLACEMENT Bilateral    knee  . KNEE ARTHROPLASTY    . REPLACEMENT TOTAL KNEE Left   . SPINE SURGERY     x 2  . tendonititis     right elbow, right wrist  . TOTAL HIP ARTHROPLASTY Left 2013  . TOTAL HIP ARTHROPLASTY Right 08/16/2015   Procedure: TOTAL HIP ARTHROPLASTY ANTERIOR APPROACH;  Surgeon: Meredith Pel, MD;  Location: [place];  Service: Orthopedics;  Laterality: Right;  . TOTAL KNEE ARTHROPLASTY Right 03/16/2015   Procedure: RIGHT TOTAL KNEE ARTHROPLASTY, PCL SACRIFICING;  Surgeon: Meredith Pel, MD;  Location: [place];  Service: Orthopedics;  Laterality: Right;    Prior to Admission medications   Medication Sig Start Date End Date Taking? Authorizing Provider  aspirin 81 MG chewable tablet Chew 81 mg by mouth daily.   Yes [provider]  atorvastatin (LIPITOR) 40 MG tablet TAKE 1 TABLET BY MOUTH EVERYDAY AT BEDTIME 12/26/17  Yes Sowles, Drue Stager, MD  baclofen (LIORESAL) 10 MG tablet TAKE ONE TABLET AT 7AM AND ONE TABLET AT 2PM AS NEEDED FOR MUSCLE SPASM 11/15/17  Yes Deveshwar, Abel Presto, MD  Cholecalciferol (D3-1000) 1000 units tablet Take 1,000 Units by mouth at bedtime.    Yes [provider]  divalproex (DEPAKOTE) 250 MG DR tablet Take 1 tablet (250 mg total) by mouth 2 (two) times daily. 12/26/17  Yes Sowles, Drue Stager, MD  estradiol (ESTRACE) 0.1 MG/GM vaginal cream FOR DIRECTIONS ON HOW TO   TAKE THIS MEDICINE,  READ   THE ENCLOSED MEDICATION    INFORMATION FORM 10/12/17  Yes McGowan, Larene Beach A, PA-C  gabapentin (NEURONTIN) 300 MG capsule TAKE 1 CAPSULE BY MOUTH 3 TIMES A DAY 12/18/17  Yes Deveshwar, Abel Presto, MD  lansoprazole (PREVACID) 30 MG capsule Take 1 capsule (30 mg total) daily by mouth. 01/11/17 01/11/18 Yes Sowles, Drue Stager, MD  levalbuterol Penne Lash) 1.25 MG/3ML nebulizer solution Take 1.25 mg by nebulization every 4 (four) hours as needed for wheezing. 05/11/17  Yes Hubbard Hartshorn, FNP  levothyroxine (SYNTHROID, LEVOTHROID) 50 MCG tablet Take 50 mcg by mouth daily before breakfast.   Yes [provider]  magnesium oxide (MAG-OX) 400 MG tablet Take 400 mg by mouth at bedtime. Reported on 07/28/2015   Yes [provider]  metoprolol tartrate (LOPRESSOR) 25 MG tablet Take 1 tablet (25 mg total)  by mouth 2 (two) times daily. 12/26/17  Yes Sowles, Drue Stager, MD  mometasone-formoterol (DULERA) 200-5 MCG/ACT AERO Inhale 2 puffs into the lungs 2 (two) times daily. Reported on 07/28/2015 04/14/16  Yes Sowles, Drue Stager, MD  montelukast (SINGULAIR) 10 MG tablet Take 1 tablet (10 mg total) by mouth at bedtime. 12/26/17  Yes Sowles, Drue Stager, MD  nystatin cream (MYCOSTATIN) APPLY TO CORNERS OF MOUTH 3 TIMES DAILY AFTER EATING 08/23/17  Yes [provider]  olmesartan-hydrochlorothiazide (BENICAR HCT) 40-12.5 MG tablet Take 1 tablet by mouth daily. 12/26/17  Yes Sowles, Drue Stager, MD  omega-3 acid ethyl esters (LOVAZA) 1 g capsule Take 2 capsules (2 g total) by mouth daily. Patient taking differently: Take 1 g by mouth 2 (two) times daily.  12/26/17  Yes Sowles, Drue Stager, MD  ondansetron (ZOFRAN) 4 MG tablet Take 1 tablet (4 mg total) by mouth every 8 (eight) hours as needed for nausea or vomiting. 04/14/16  Yes Sowles, Drue Stager, MD  polyethylene glycol (MIRALAX / GLYCOLAX) packet Take 17 g by mouth daily as needed.   Yes [provider]  venlafaxine XR (EFFEXOR-XR) 150 MG 24 hr capsule Take 1  capsule (150 mg total) by mouth daily with breakfast. Patient taking differently: Take 150 mg by mouth 2 (two) times daily.  10/09/17  Yes Sowles, Drue Stager, MD  vitamin B-12 (CYANOCOBALAMIN) 1000 MCG tablet Take 1,000 mcg by mouth at bedtime.   Yes [provider]  polyethylene glycol powder (GLYCOLAX/MIRALAX) powder 255 grams one bottle for capsule endoscopy prep Patient not taking: Reported on 01/05/2018 04/05/16    Bellow, MD     Allergies Morphine; Diazepam; Lyrica [pregabalin]; Metformin and related; Latex; Rash away  [petrolatum-zinc oxide]; Salicylates; and Tape  Family History  Problem Relation Age of Onset  . Diabetes Mother   . Hypertension Mother   . Heart disease Mother   . Alzheimer's disease Mother   . Cancer Father   . Diabetes Father   . Hypertension Sister   . Depression Sister   . Fibromyalgia Sister   . Cancer Brother   . Heart disease Brother   . Stroke Son   . Fibromyalgia Daughter   . Bladder Cancer Neg Hx   . Kidney disease Neg Hx   . Kidney cancer Neg Hx     Social History Social History   Tobacco Use  . Smoking status: Never Smoker  . Smokeless tobacco: Never Used  . Tobacco comment: smoking cessation materials not required  Substance Use Topics  . Alcohol use: No    Alcohol/week: 0.0 standard drinks  . Drug use: No    Review of Systems  Constitutional: No fever/chills Eyes: No visual changes.  ENT: No neck pain Cardiovascular: Denies chest pain. Respiratory: Denies shortness of breath. Gastrointestinal: No abdominal pain.   Genitourinary: Incontinence as above, no dysuria Musculoskeletal: As above Skin: Negative for rash. Neurological: Numbness left groin, no weakness   ____________________________________________   PHYSICAL EXAM:  VITAL SIGNS: ED Triage Vitals  Enc Vitals Group     BP 01/05/18 2025 (!) 128/49     Pulse Rate 01/05/18 2025 68     Resp 01/05/18 2025 16     Temp 01/05/18 2025 98.2 F (36.8 C)       Temp Source 01/05/18 2025 Oral     SpO2 01/05/18 2025 100 %     Weight 01/05/18 2026 99.3 kg (219 lb)     Height 01/05/18 2026 1.626 m (5\' 4" )     Head Circumference --  Peak Flow --      Pain Score 01/05/18 2037 10     Pain Loc --      Pain Edu? --      Excl. in [place]? --     Constitutional: Alert and oriented.  Clearly uncomfortable, Eyes: Conjunctivae are normal.   Nose: No congestion/rhinnorhea. Mouth/Throat: Mucous membranes are moist.    Cardiovascular: Normal rate, regular rhythm. Grossly normal heart sounds.  Good peripheral circulation. Respiratory: Normal respiratory effort.  No retractions. Gastrointestinal: Soft and nontender. No distention.   Musculoskeletal: Normal strength in the lower extremities.  Warm and well perfused Neurologic:  Normal speech and language.  Normal strength in the lower extremities, describes tingling/numbness only in the left groin to just about the left mid thigh, nothing on the right side. Skin:  Skin is warm, dry and intact. No rash noted.   ____________________________________________   LABS (all labs ordered are listed, but only abnormal results are displayed)  Labs Reviewed  BASIC METABOLIC PANEL - Abnormal; Notable for the following components:      Result Value   Glucose, Bld 127 (*)    All other components within normal limits  URINALYSIS, COMPLETE (UACMP) WITH MICROSCOPIC - Abnormal; Notable for the following components:   Color, Urine STRAW (*)    APPearance CLEAR (*)    All other components within normal limits  CBC   ____________________________________________  EKG  None ____________________________________________  RADIOLOGY  MRI lumbar spine ____________________________________________   PROCEDURES  Procedure(s) performed: No  Procedures   Critical Care performed: yes  CRITICAL CARE Performed by: Lavonia Drafts   Total critical care time: 30 minutes  Critical care time was exclusive of  separately billable procedures and treating other patients.  Critical care was necessary to treat or prevent imminent or life-threatening deterioration.  Critical care was time spent personally by me on the following activities: development of treatment plan with patient and/or surrogate as well as nursing, discussions with consultants, evaluation of patient's response to treatment, examination of patient, obtaining history from patient or surrogate, ordering and performing treatments and interventions, ordering and review of laboratory studies, ordering and review of radiographic studies, pulse oximetry and re-evaluation of patient's condition.  ____________________________________________   INITIAL IMPRESSION / ASSESSMENT AND PLAN / ED COURSE  Pertinent labs & imaging results that were available during my care of the patient were reviewed by me and considered in my medical decision making (see chart for details).  Patient presents with worsening low back pain in the setting of prior back surgeries with new incontinence as well as left groin numbness.  Concern for possible cauda equina syndrome, will obtain MRI.  Patient given IV Dilaudid and IV Zofran for pain  I have asked Dr. Karma Greaser to follow-up MRI results, anticipate transfer.    ____________________________________________   FINAL CLINICAL IMPRESSION(S) / ED DIAGNOSES  Final diagnoses:  Cauda equina syndrome ([place])  Protrusion of lumbar intervertebral disc  Intractable back pain        Note:  This document was prepared using Dragon voice recognition software and may include unintentional dictation errors.    Lavonia Drafts, MD 01/06/18 [phone]

## 2018-01-05 NOTE — ED Notes (Signed)
Per request of pt, states she is unable to sit in wheelchair or chairs, requesting bed or recliner, pt placed in [place] in recliner chair with husband.

## 2018-01-05 NOTE — ED Triage Notes (Signed)
Pt has had back trouble for years now with multiple back surgeries, has been barely able to walk ever since per husband, today, left groin pain that woke her up while laying in the bed causing incontinence most likely r/t back pain, has had some pain with urination, denies hematuria, states about 3 inch area has gone numb from left groin to upper left thigh,took 2 pain pills around 6pm with little relief. Dr at [facility] stated for her to come here, requested an MRI of left hip/groin/lower back and that it is sent to him. Pt falling asleep between questions during triage, "due to pain meds" per husband.

## 2018-01-05 NOTE — ED Notes (Signed)
Husband stating "will the Dr's here do an MRI, because if not, we will go to [facility]. This RN reassured pts husband and EDP will assess pt and decide if MRI is medically necessary at this time.

## 2018-01-05 NOTE — ED Provider Notes (Signed)
----------------------------------------- 11:11 PM on 01/05/2018 -----------------------------------------   Assuming care from Dr. Corky Downs.  In short, Jacqueline Lucas is a 67 y.o. female with a chief complaint of low back pain and neuro symptoms.  Refer to the original H&P for additional details.  The current plan of care is to follow up MRI and reassess.   ----------------------------------------- 12:45 AM on 01/06/2018 -----------------------------------------  Mr Lumbar Spine Wo Contrast  Result Date: 01/06/2018 CLINICAL DATA:  Back and left groin pain. History of multiple back surgeries EXAM: MRI LUMBAR SPINE WITHOUT CONTRAST TECHNIQUE: Multiplanar, multisequence MR imaging of the lumbar spine was performed. No intravenous contrast was administered. COMPARISON:  Radiograph 07/16/2015.  CT scan 10/20/2013 FINDINGS: Segmentation: There are five lumbar type vertebral bodies. The last full intervertebral disc space is labeled L5-S1. This correlates with the prior CT scan and radiographs. Alignment: Normal overall alignment. There is degenerative anterolisthesis of L4 which is stable. Vertebrae: Endplate reactive changes and postoperative changes with associated artifact. No bone lesions or acute fracture. There are pedicle screws and interbody Ray cage fusion devices from L2-S1. Conus medullaris and cauda equina: Conus extends to the T12-L1 level. Conus and cauda equina appear normal. Paraspinal and other soft tissues: No significant findings. Disc levels: T12-L1: No significant findings. L1-2: Large central and left paracentral disc extrusion with disc material coursing up behind the L1 vertebral body on the left side with mass effect on the left side of the thecal sac. At the disc space level there is a moderate central disc protrusion and this in combination with short pedicles and facet disease creates moderate spinal, left lateral and left foraminal stenosis. L2-3: No significant findings.  Posterior and interbody fusion changes. L3-4: Posterior and interbody fusion changes. Decompressive laminectomy. Mild osteophytic ridging posteriorly but no significant spinal or foraminal stenosis. L4-5: Degenerative anterolisthesis of L4 with a bulging uncovered disc. Posterior and interbody fusion changes. There is mild spinal and bilateral lateral recess stenosis. L5-S1: Interbody Ray cage fusion devices. No significant spinal or foraminal stenosis. IMPRESSION: 1. Postoperative changes with pedicle screws, posterior rods and interbody fusion devices from L2-S1. No complicating features are identified. 2. Moderate to large central disc extrusion at L1-2 with disc material coursing up behind the L1 vertebral body on the left side. There is moderate spinal, bilateral lateral recess and left foraminal stenosis at this base level and moderate mass effect on the left side of the thecal sac above the disc space level. 3. Mild spinal and bilateral lateral recess stenosis at L4-5. Electronically Signed   By: Marijo Sanes M.D.   On: 01/06/2018 00:00    MRI findings are concerning for cauda equina syndrome and consistent with her clinical presentation.  I verified with the patient that this has been gradually worsening over about 10 days but became much more acute over the last 24 hours.  I spoke by phone with Dr. Candise Che, the radiologist who interpreted the images, and he definitely felt that the thecal sac compression could explain her cauda equina syndrome symptoms.  I updated the patient and her husband and providing another milligram of Dilaudid IV for her refractory and intractable pain.  She is comfortable with the plan for me to call [facility] and speak with neurosurgery.  I have spoken with Caren Griffins at the [facility] transfer center and I am awaiting callback by the spinal surgeon.   ----------------------------------------- 3:25 AM on 01/06/2018 -----------------------------------------   (Note that  documentation was delayed due to multiple ED patients requiring immediate care.)  I spoke with Dr. Lurline Idol with [place] neurosurgery.  We discussed the case in detail and she was uncertain or unconvinced that the patient needed emergent neurosurgical evaluation.  I stressed that I was not suggesting she needed emergent surgery but simply that she needed a higher level of care than we can provide at our facility.  She asked about hospitalist admission but I am uncomfortable admitting the patient for pain control when neurosurgery is not available for more than 24 hours and I believe that the patient needs urgent neurosurgical assessment and possible surgical intervention.  After conferring with the patient and her husband I contacted [facility] and spoke directly with Dr. Alycia Patten with [facility] neurosurgery.  I read to him the MRI findings and explained the clinical picture as I understand it and he agreed that the patient needs urgent evaluation and requested the transfer center that they assign her bed as soon as possible so that we can transfer her as quickly as possible to their facility and for their evaluation.  I asked about Decadron and he agreed that I should go ahead and administer Decadron 10 mg IV.  I updated the patient and her husband.  I asked that she remain n.p.o.  She is hemodynamically stable and has had no neurological changes.  I stressed to her that I cannot tell her for certain whether or not she will require surgery, but she does require expert assessment and review of her imaging with clinical correlation.  She understands and agrees with the plan.  Her pain continues to be severe and she received a third dose of Dilaudid 1 mg IV prior to EMS transfer.  I updated the [facility] transfer team in person regarding the clinical scenario and the reason for transfer.    Hinda Kehr, MD 01/06/18 [phone]

## 2018-01-06 ENCOUNTER — Ambulatory Visit (HOSPITAL_COMMUNITY)
Admission: AD | Admit: 2018-01-06 | Discharge: 2018-01-06 | Disposition: A | Discharge status: Home / Self Care | Payer: Medicare Other | Source: Other Acute Inpatient Hospital | Attending: Emergency Medicine | Admitting: Emergency Medicine

## 2018-01-06 DIAGNOSIS — M549 Dorsalgia, unspecified: Secondary | ICD-10-CM | POA: Diagnosis not present

## 2018-01-06 DIAGNOSIS — M5126 Other intervertebral disc displacement, lumbar region: Secondary | ICD-10-CM | POA: Diagnosis not present

## 2018-01-06 DIAGNOSIS — J45909 Unspecified asthma, uncomplicated: Secondary | ICD-10-CM | POA: Diagnosis not present

## 2018-01-06 DIAGNOSIS — M48061 Spinal stenosis, lumbar region without neurogenic claudication: Secondary | ICD-10-CM | POA: Diagnosis not present

## 2018-01-06 DIAGNOSIS — E119 Type 2 diabetes mellitus without complications: Secondary | ICD-10-CM | POA: Diagnosis present

## 2018-01-06 DIAGNOSIS — I10 Essential (primary) hypertension: Secondary | ICD-10-CM | POA: Diagnosis present

## 2018-01-06 DIAGNOSIS — G834 Cauda equina syndrome: Secondary | ICD-10-CM | POA: Diagnosis not present

## 2018-01-06 DIAGNOSIS — E039 Hypothyroidism, unspecified: Secondary | ICD-10-CM | POA: Diagnosis present

## 2018-01-06 DIAGNOSIS — F329 Major depressive disorder, single episode, unspecified: Secondary | ICD-10-CM | POA: Diagnosis not present

## 2018-01-06 DIAGNOSIS — F419 Anxiety disorder, unspecified: Secondary | ICD-10-CM | POA: Diagnosis not present

## 2018-01-06 DIAGNOSIS — M5116 Intervertebral disc disorders with radiculopathy, lumbar region: Secondary | ICD-10-CM | POA: Diagnosis present

## 2018-01-06 MED ORDER — SODIUM CHLORIDE 0.9 % IV SOLN
75.00 | INTRAVENOUS | Status: DC
Start: ? — End: 2018-01-06

## 2018-01-06 MED ORDER — ONDANSETRON HCL 4 MG/2ML IJ SOLN
4.00 | INTRAMUSCULAR | Status: DC
Start: ? — End: 2018-01-06

## 2018-01-06 MED ORDER — DEXAMETHASONE SODIUM PHOSPHATE 10 MG/ML IJ SOLN
10.0000 mg | Freq: Once | INTRAMUSCULAR | Status: AC
  Administered 2018-01-06: 10 mg via INTRAVENOUS
  Filled 2018-01-06: qty 1

## 2018-01-06 MED ORDER — VENLAFAXINE HCL ER 150 MG PO CP24
150.00 | ORAL_CAPSULE | ORAL | Status: DC
Start: 2018-01-08 — End: 2018-01-06

## 2018-01-06 MED ORDER — MORPHINE SULFATE 2 MG/ML IJ SOLN
2.00 | INTRAMUSCULAR | Status: DC
Start: ? — End: 2018-01-06

## 2018-01-06 MED ORDER — DOCUSATE SODIUM 100 MG PO CAPS
100.00 | ORAL_CAPSULE | ORAL | Status: DC
Start: 2018-01-07 — End: 2018-01-06

## 2018-01-06 MED ORDER — OXYCODONE-ACETAMINOPHEN 5-325 MG PO TABS
1.00 | ORAL_TABLET | ORAL | Status: DC
Start: ? — End: 2018-01-06

## 2018-01-06 MED ORDER — METOPROLOL TARTRATE 25 MG PO TABS
25.00 | ORAL_TABLET | ORAL | Status: DC
Start: 2018-01-07 — End: 2018-01-06

## 2018-01-06 MED ORDER — HYDROMORPHONE HCL 1 MG/ML IJ SOLN
1.0000 mg | Freq: Once | INTRAMUSCULAR | Status: AC
  Administered 2018-01-06: 1 mg via INTRAVENOUS

## 2018-01-06 MED ORDER — ALBUTEROL SULFATE HFA 108 (90 BASE) MCG/ACT IN AERS
2.00 | INHALATION_SPRAY | RESPIRATORY_TRACT | Status: DC
Start: ? — End: 2018-01-06

## 2018-01-06 MED ORDER — DIPHENHYDRAMINE HCL 50 MG PO CAPS
50.00 | ORAL_CAPSULE | ORAL | Status: DC
Start: ? — End: 2018-01-06

## 2018-01-06 MED ORDER — ACETAMINOPHEN 325 MG PO TABS
650.00 | ORAL_TABLET | ORAL | Status: DC
Start: ? — End: 2018-01-06

## 2018-01-06 MED ORDER — HYDROMORPHONE HCL 1 MG/ML IJ SOLN
1.0000 mg | INTRAMUSCULAR | Status: AC
  Administered 2018-01-06: 1 mg via INTRAVENOUS
  Filled 2018-01-06: qty 1

## 2018-01-06 MED ORDER — HYDROMORPHONE HCL 1 MG/ML IJ SOLN
INTRAMUSCULAR | Status: AC
  Filled 2018-01-06: qty 1

## 2018-01-06 MED ORDER — CEFAZOLIN SODIUM-DEXTROSE 2-3 GM-%(50ML) IV SOLR
2.00 | INTRAVENOUS | Status: DC
Start: 2018-01-06 — End: 2018-01-06

## 2018-01-06 MED ORDER — LEVOTHYROXINE SODIUM 50 MCG PO TABS
50.00 | ORAL_TABLET | ORAL | Status: DC
Start: 2018-01-08 — End: 2018-01-06

## 2018-01-06 NOTE — ED Notes (Signed)
Bladder scan administered, 1st reading 17 mL, 2nd reading 16 mL, 3rd reading 17 mL.AS

## 2018-01-07 ENCOUNTER — Ambulatory Visit (INDEPENDENT_AMBULATORY_CARE_PROVIDER_SITE_OTHER): Payer: Medicare Other | Admitting: Vascular Surgery

## 2018-01-07 MED ORDER — GABAPENTIN 300 MG PO CAPS
300.00 | ORAL_CAPSULE | ORAL | Status: DC
Start: 2018-01-07 — End: 2018-01-07

## 2018-01-07 MED ORDER — SENNOSIDES 8.6 MG PO TABS
2.00 | ORAL_TABLET | ORAL | Status: DC
Start: 2018-01-08 — End: 2018-01-07

## 2018-01-08 ENCOUNTER — Telehealth (INDEPENDENT_AMBULATORY_CARE_PROVIDER_SITE_OTHER): Payer: Self-pay

## 2018-01-08 NOTE — Telephone Encounter (Signed)
Patient called and stated that she had to have an emergency back surgery and that now since she had that procedure, her inner thighs and upper legs are burning so bad and she would like to know if this burning is from what we treat her for, or do you think that it may be due to the back surgery?

## 2018-01-08 NOTE — Telephone Encounter (Signed)
It is likely more closely related to her back surgery.  She should contact her neurosurgeon and see how soon they are able to follow up with her.

## 2018-01-08 NOTE — Telephone Encounter (Signed)
Called the patient back and spoke to her husband as he answered the phone, and I let him know that they should be contacting the neurosurgeon to notify him as to what is going on. He stated that he would be giving them a call.

## 2018-01-09 ENCOUNTER — Ambulatory Visit (INDEPENDENT_AMBULATORY_CARE_PROVIDER_SITE_OTHER): Payer: Medicare Other | Admitting: Orthopedic Surgery

## 2018-01-10 DIAGNOSIS — Z981 Arthrodesis status: Secondary | ICD-10-CM | POA: Diagnosis not present

## 2018-01-10 DIAGNOSIS — Z96653 Presence of artificial knee joint, bilateral: Secondary | ICD-10-CM | POA: Diagnosis not present

## 2018-01-10 DIAGNOSIS — I10 Essential (primary) hypertension: Secondary | ICD-10-CM | POA: Diagnosis not present

## 2018-01-10 DIAGNOSIS — M5126 Other intervertebral disc displacement, lumbar region: Secondary | ICD-10-CM | POA: Diagnosis not present

## 2018-01-10 DIAGNOSIS — Z4789 Encounter for other orthopedic aftercare: Secondary | ICD-10-CM | POA: Diagnosis not present

## 2018-01-10 DIAGNOSIS — Z96643 Presence of artificial hip joint, bilateral: Secondary | ICD-10-CM | POA: Diagnosis not present

## 2018-01-10 DIAGNOSIS — Z79891 Long term (current) use of opiate analgesic: Secondary | ICD-10-CM | POA: Diagnosis not present

## 2018-01-14 DIAGNOSIS — I10 Essential (primary) hypertension: Secondary | ICD-10-CM | POA: Diagnosis not present

## 2018-01-14 DIAGNOSIS — Z981 Arthrodesis status: Secondary | ICD-10-CM | POA: Diagnosis not present

## 2018-01-14 DIAGNOSIS — M5126 Other intervertebral disc displacement, lumbar region: Secondary | ICD-10-CM | POA: Diagnosis not present

## 2018-01-14 DIAGNOSIS — Z96653 Presence of artificial knee joint, bilateral: Secondary | ICD-10-CM | POA: Diagnosis not present

## 2018-01-14 DIAGNOSIS — Z4789 Encounter for other orthopedic aftercare: Secondary | ICD-10-CM | POA: Diagnosis not present

## 2018-01-14 DIAGNOSIS — Z96643 Presence of artificial hip joint, bilateral: Secondary | ICD-10-CM | POA: Diagnosis not present

## 2018-01-16 ENCOUNTER — Ambulatory Visit (INDEPENDENT_AMBULATORY_CARE_PROVIDER_SITE_OTHER): Payer: Medicare Other

## 2018-01-16 ENCOUNTER — Encounter (INDEPENDENT_AMBULATORY_CARE_PROVIDER_SITE_OTHER): Payer: Self-pay | Admitting: Orthopedic Surgery

## 2018-01-16 ENCOUNTER — Ambulatory Visit (INDEPENDENT_AMBULATORY_CARE_PROVIDER_SITE_OTHER): Payer: Medicare Other | Admitting: Orthopedic Surgery

## 2018-01-16 DIAGNOSIS — Z96642 Presence of left artificial hip joint: Secondary | ICD-10-CM

## 2018-01-16 DIAGNOSIS — I209 Angina pectoris, unspecified: Secondary | ICD-10-CM | POA: Diagnosis not present

## 2018-01-16 NOTE — Progress Notes (Signed)
Office Visit Note   Patient: Jacqueline Lucas           Date of Birth: Jan 11, 1951           MRN: [id number] Visit Date: 01/16/2018 Requested by: [name], [place],  [address] PCP: [name], MD  Subjective: Chief Complaint  Patient presents with  . Left Hip - Pain    HPI: Jacqueline Lucas is a patient with left hip pain.  She reports primarily burning and numbness which is on the proximal thigh region.'s been going on for over a year.  She did have left total hip replacement performed elsewhere about 10 years ago.  She went to the emergency room on 01/05/2018 and actually had surgery for what sounds like a significant disc herniation at L1 to above the level of her prior fusion.  She is now about a week out from surgery.  She does describe some left groin pain and wants to make sure that there is no problem with the left total hip replacement.  Again her pain is described as primarily burning pain in the groin with some numbness extending down the anterior left thigh.  She is able to walk at this time.  She is on pain medicine anti-inflammatory and muscle relaxer.              ROS: All systems reviewed are negative as they relate to the chief complaint within the history of present illness.  Patient denies  fevers or chills.   Assessment & Plan: Visit Diagnoses:  1. History of total left hip arthroplasty     Plan: Impression is left hip pain which looks like it residual pain from her decompression and disc removal.  I do not think there is a significant problem with the left hip.  I do not think any further work-up is indicated.  Follow-up as needed  Follow-Up Instructions: Return if symptoms worsen or fail to improve.   Orders:  Orders Placed This Encounter  Procedures  . XR HIP UNILAT W OR W/O PELVIS 2-3 VIEWS LEFT   No orders of the defined types were placed in this encounter.     Procedures: No procedures performed   Clinical  Data: No additional findings.  Objective: Vital Signs: There were no vitals taken for this visit.  Physical Exam:   Constitutional: Patient appears well-developed HEENT:  Head: Normocephalic Eyes:EOM are normal Neck: Normal range of motion Cardiovascular: Normal rate Pulmonary/chest: Effort normal Neurologic: Patient is alert Skin: Skin is warm Psychiatric: Patient has normal mood and affect    Ortho Exam: Ortho exam demonstrates good ankle dorsi flexion plantarflexion quad hamstring strength.  Does have some pain with adduction.  There is some paresthesia and numbness on the anterior aspect of the proximal left thigh.  Only mild groin pain with internal and external rotation of the leg.  No saddle paresthesias today.  Specialty Comments:  No specialty comments available.  Imaging: Xr Hip Unilat W Or W/o Pelvis 2-3 Views Left  Result Date: 01/16/2018 AP pelvis lateral left hip reviewed.  Bilateral hip prosthesis in good position alignment.  Acetabular cup on the left slightly vertical.  No evidence of ostial lysis or pelvic ring discontinuity.    PMFS History: Patient Active Problem List   Diagnosis Date Noted  . Hx of hysterectomy 08/13/2017  . Pain in left wrist 06/12/2017  . Chronic venous insufficiency 06/09/2017  . Varicose veins of both lower extremities with pain 04/30/2017  .  Claudication ([place]) 04/30/2017  . Arthritis of carpometacarpal [facility]) joint of left thumb 05/12/2016  . Bilateral thumb pain 05/01/2016  . Chronic bronchitis ([place]) 04/14/2016  . Anemia 02/03/2016  . Status post bilateral total hip replacement 01/10/2016  . Sicca ([place]) 01/10/2016  . Age related osteoporosis 01/10/2016  . Hip arthritis 08/16/2015  . Microscopic hematuria 07/30/2015  . Vaginal atrophy 07/30/2015  . GAD (generalized anxiety disorder) 07/08/2015  . Moderate episode of recurrent major depressive disorder ([place]) 07/08/2015  . Migraine with aura and without status migrainosus,  not intractable 07/08/2015  . Dyslipidemia 07/08/2015  . Degenerative arthritis of right knee 03/16/2015  . Atrophic vaginitis 01/31/2015  . Recurrent UTI 12/31/2014  . IBS (irritable bowel syndrome) 10/29/2014  . Asthma, moderate persistent 09/08/2014  . Allergic state 09/07/2014  . Colon polyp 09/07/2014  . Hemorrhoid 09/07/2014  . Constrictive tenosynovitis 03/12/2014  . H/O total knee replacement, bilateral 07/31/2013  . CAD (coronary artery disease) 11/28/2012  . Anxiety 11/28/2012  . Acid reflux 11/28/2012  . Cardiac murmur 11/28/2012  . BP (high blood pressure) 11/28/2012  . Cannot sleep 11/28/2012  . Adiposity 11/28/2012  . Arthritis, degenerative 11/28/2012  . Restless leg 11/28/2012  . Supraventricular tachycardia ([place]) 11/28/2012  . Fibromyalgia 11/28/2012  . Tachycardia 11/28/2012   Past Medical History:  Diagnosis Date  . Allergy   . Allergy to adhesive tape    Allergy to paper tape as well  . Alzheimer disease ([place])   . Anemia   . Anginal pain ([place])   . Anxiety   . Arthritis   . Asthma   . Bronchitis   . Cataract   . Chronic nausea   . Claustrophobia   . DDD (degenerative disc disease), lumbar   . Depression   . Dry eyes    uses Restasis  . Fibromyalgia   . GERD (gastroesophageal reflux disease)   . H/O bladder infections   . Heart murmur   . Hematuria   . Hyperlipidemia   . Hypertension   . Hypothyroidism   . IBS (irritable bowel syndrome)   . Insomnia   . Lumbar neuritis   . Migraines   . Obesity   . Osteoporosis   . Palpitation   . PONV (postoperative nausea and vomiting)   . RLS (restless legs syndrome)   . Shoulder fracture, right   . Sicca ([place])   . Tachycardia   . Thyroid disease     Family History  Problem Relation Age of Onset  . Diabetes Mother   . Hypertension Mother   . Heart disease Mother   . Alzheimer's disease Mother   . Cancer Father   . Diabetes Father   . Hypertension Sister   . Depression Sister   .  Fibromyalgia Sister   . Cancer Brother   . Heart disease Brother   . Stroke Son   . Fibromyalgia Daughter   . Bladder Cancer Neg Hx   . Kidney disease Neg Hx   . Kidney cancer Neg Hx     Past Surgical History:  Procedure Laterality Date  . ABDOMINAL HYSTERECTOMY     due to cancer of ovaries  . BACK SURGERY N/A 2014   x 5  . CARDIAC CATHETERIZATION     no PCI; 12/18/12 ([facility], Dr. Marcello Moores): EP study with ablation. No coronary angiography done then.  . COLONOSCOPY W/ POLYPECTOMY    . COLONOSCOPY WITH PROPOFOL N/A 02/04/2016   Procedure: COLONOSCOPY WITH PROPOFOL;  Surgeon: Robert Bellow, MD;  Location: [facility] ENDOSCOPY;  Service: Endoscopy;  Laterality: N/A;  . ESOPHAGOGASTRODUODENOSCOPY (EGD) WITH PROPOFOL N/A 02/04/2016   Procedure: ESOPHAGOGASTRODUODENOSCOPY (EGD) WITH PROPOFOL;  Surgeon: Robert Bellow, MD;  Location: [facility] ENDOSCOPY;  Service: Endoscopy;  Laterality: N/A;  . FINGER ARTHROSCOPY WITH CARPOMETACARPEL ([place]) ARTHROPLASTY Left 05/01/2016   Dr. Erlinda Hong  . GIVENS CAPSULE STUDY N/A 05/02/2016   Procedure: GIVENS CAPSULE STUDY;  Surgeon: Manya Silvas, MD;  Location: [facility] ENDOSCOPY;  Service: Endoscopy;  Laterality: N/A;  . HAND SURGERY    . HEMORRHOID SURGERY    . HIP ARTHROPLASTY    . JOINT REPLACEMENT Bilateral    knee  . KNEE ARTHROPLASTY    . REPLACEMENT TOTAL KNEE Left   . SPINE SURGERY     x 2  . tendonititis     right elbow, right wrist  . TOTAL HIP ARTHROPLASTY Left 2013  . TOTAL HIP ARTHROPLASTY Right 08/16/2015   Procedure: TOTAL HIP ARTHROPLASTY ANTERIOR APPROACH;  Surgeon: Meredith Pel, MD;  Location: [place];  Service: Orthopedics;  Laterality: Right;  . TOTAL KNEE ARTHROPLASTY Right 03/16/2015   Procedure: RIGHT TOTAL KNEE ARTHROPLASTY, PCL SACRIFICING;  Surgeon: Meredith Pel, MD;  Location: [place];  Service: Orthopedics;  Laterality: Right;   Social History   Occupational History  . Occupation: Retired  Tobacco Use  . Smoking status: Never  Smoker  . Smokeless tobacco: Never Used  . Tobacco comment: smoking cessation materials not required  Substance and Sexual Activity  . Alcohol use: No    Alcohol/week: 0.0 standard drinks  . Drug use: No  . Sexual activity: Never

## 2018-01-21 ENCOUNTER — Ambulatory Visit (INDEPENDENT_AMBULATORY_CARE_PROVIDER_SITE_OTHER): Payer: Medicare Other | Admitting: Nurse Practitioner

## 2018-01-21 ENCOUNTER — Encounter: Payer: Self-pay | Admitting: Nurse Practitioner

## 2018-01-21 VITALS — BP 130/60 | HR 75 | Temp 97.6°F | Resp 16 | Ht 64.0 in | Wt 209.0 lb

## 2018-01-21 DIAGNOSIS — J302 Other seasonal allergic rhinitis: Secondary | ICD-10-CM | POA: Diagnosis not present

## 2018-01-21 DIAGNOSIS — K1329 Other disturbances of oral epithelium, including tongue: Secondary | ICD-10-CM

## 2018-01-21 DIAGNOSIS — K1379 Other lesions of oral mucosa: Secondary | ICD-10-CM | POA: Diagnosis not present

## 2018-01-21 MED ORDER — MONTELUKAST SODIUM 10 MG PO TABS: 10.0000 mg | ORAL_TABLET | Freq: Every day | ORAL | 1 refills | Status: DC

## 2018-01-21 MED ORDER — MAGIC MOUTHWASH W/LIDOCAINE: 5.0000 mL | Freq: Three times a day (TID) | ORAL | 0 refills | Status: DC

## 2018-01-21 NOTE — Patient Instructions (Addendum)
-   Please use magic mouth wash and swish and spit at least twice a day. Swish for at least 30 seconds at a time - If symptoms are not improving within the next few days please call and we will send a referral to ENT - Please follow up with neurosurgeon about groin pain

## 2018-01-21 NOTE — Progress Notes (Addendum)
Name: Jacqueline Lucas   MRN: [id number]    DOB: Jul 04, 1950   Date:01/21/2018       Progress Note  Subjective  Chief Complaint  Chief Complaint  Patient presents with  . Jacqueline Lucas    thinks that she has developed thrush on her tongue for antibiotics  . Groin Pain    she continues to have pain in left side of her groin x 3 weeks. She describes pain as numbness and burning. She was evaluated at ED for pain and ended up having emergency back surgery and pain continues.    HPI  Patient endorses white flecks under tongue states was really bad before she started using nystatin due to pain and whiteness. Thick and painful; states came out some with gargling. Most of the white areas at sublingual area are gone and no longer painful.   Patient endorses numbness in her groin area saw orthopedic surgeon who noted- it was likely from back surgery. Patient states has not informed neurosurgeon   Patient Active Problem List   Diagnosis Date Noted  . Hx of hysterectomy 08/13/2017  . Pain in left wrist 06/12/2017  . Chronic venous insufficiency 06/09/2017  . Varicose veins of both lower extremities with pain 04/30/2017  . Claudication ([place]) 04/30/2017  . Arthritis of carpometacarpal [facility]) joint of left thumb 05/12/2016  . Bilateral thumb pain 05/01/2016  . Chronic bronchitis ([place]) 04/14/2016  . Anemia 02/03/2016  . Status post bilateral total hip replacement 01/10/2016  . Sicca ([place]) 01/10/2016  . Age related osteoporosis 01/10/2016  . Hip arthritis 08/16/2015  . Microscopic hematuria 07/30/2015  . Vaginal atrophy 07/30/2015  . GAD (generalized anxiety disorder) 07/08/2015  . Moderate episode of recurrent major depressive disorder ([place]) 07/08/2015  . Migraine with aura and without status migrainosus, not intractable 07/08/2015  . Dyslipidemia 07/08/2015  . Degenerative arthritis of right knee 03/16/2015  . Atrophic vaginitis 01/31/2015  . Recurrent UTI 12/31/2014  . IBS (irritable bowel  syndrome) 10/29/2014  . Asthma, moderate persistent 09/08/2014  . Allergic state 09/07/2014  . Colon polyp 09/07/2014  . Hemorrhoid 09/07/2014  . Constrictive tenosynovitis 03/12/2014  . H/O total knee replacement, bilateral 07/31/2013  . CAD (coronary artery disease) 11/28/2012  . Anxiety 11/28/2012  . Acid reflux 11/28/2012  . Cardiac murmur 11/28/2012  . BP (high blood pressure) 11/28/2012  . Cannot sleep 11/28/2012  . Adiposity 11/28/2012  . Arthritis, degenerative 11/28/2012  . Restless leg 11/28/2012  . Supraventricular tachycardia ([place]) 11/28/2012  . Fibromyalgia 11/28/2012  . Tachycardia 11/28/2012    Past Medical History:  Diagnosis Date  . Allergy   . Allergy to adhesive tape    Allergy to paper tape as well  . Alzheimer disease ([place])   . Anemia   . Anginal pain ([place])   . Anxiety   . Arthritis   . Asthma   . Bronchitis   . Cataract   . Chronic nausea   . Claustrophobia   . DDD (degenerative disc disease), lumbar   . Depression   . Dry eyes    uses Restasis  . Fibromyalgia   . GERD (gastroesophageal reflux disease)   . H/O bladder infections   . Heart murmur   . Hematuria   . Hyperlipidemia   . Hypertension   . Hypothyroidism   . IBS (irritable bowel syndrome)   . Insomnia   . Lumbar neuritis   . Migraines   . Obesity   . Osteoporosis   . Palpitation   .  PONV (postoperative nausea and vomiting)   . RLS (restless legs syndrome)   . Shoulder fracture, right   . Sicca ([place])   . Tachycardia   . Thyroid disease     Past Surgical History:  Procedure Laterality Date  . ABDOMINAL HYSTERECTOMY     due to cancer of ovaries  . BACK SURGERY N/A 2014   x 5  . CARDIAC CATHETERIZATION     no PCI; 12/18/12 ([facility], Dr. Marcello Moores): EP study with ablation. No coronary angiography done then.  . COLONOSCOPY W/ POLYPECTOMY    . COLONOSCOPY WITH PROPOFOL N/A 02/04/2016   Procedure: COLONOSCOPY WITH PROPOFOL;  Surgeon: Robert Bellow, MD;  Location: [facility]  ENDOSCOPY;  Service: Endoscopy;  Laterality: N/A;  . ESOPHAGOGASTRODUODENOSCOPY (EGD) WITH PROPOFOL N/A 02/04/2016   Procedure: ESOPHAGOGASTRODUODENOSCOPY (EGD) WITH PROPOFOL;  Surgeon: Robert Bellow, MD;  Location: [facility] ENDOSCOPY;  Service: Endoscopy;  Laterality: N/A;  . FINGER ARTHROSCOPY WITH CARPOMETACARPEL ([place]) ARTHROPLASTY Left 05/01/2016   Dr. Erlinda Hong  . GIVENS CAPSULE STUDY N/A 05/02/2016   Procedure: GIVENS CAPSULE STUDY;  Surgeon: Manya Silvas, MD;  Location: [facility] ENDOSCOPY;  Service: Endoscopy;  Laterality: N/A;  . HAND SURGERY    . HEMORRHOID SURGERY    . HIP ARTHROPLASTY    . JOINT REPLACEMENT Bilateral    knee  . KNEE ARTHROPLASTY    . REPLACEMENT TOTAL KNEE Left   . SPINE SURGERY     x 2  . tendonititis     right elbow, right wrist  . TOTAL HIP ARTHROPLASTY Left 2013  . TOTAL HIP ARTHROPLASTY Right 08/16/2015   Procedure: TOTAL HIP ARTHROPLASTY ANTERIOR APPROACH;  Surgeon: Meredith Pel, MD;  Location: [place];  Service: Orthopedics;  Laterality: Right;  . TOTAL KNEE ARTHROPLASTY Right 03/16/2015   Procedure: RIGHT TOTAL KNEE ARTHROPLASTY, PCL SACRIFICING;  Surgeon: Meredith Pel, MD;  Location: [place];  Service: Orthopedics;  Laterality: Right;    Social History   Tobacco Use  . Smoking status: Never Smoker  . Smokeless tobacco: Never Used  . Tobacco comment: smoking cessation materials not required  Substance Use Topics  . Alcohol use: No    Alcohol/week: 0.0 standard drinks     Current Outpatient Medications:  .  aspirin 81 MG chewable tablet, Chew 81 mg by mouth daily., Disp: , Rfl:  .  atorvastatin (LIPITOR) 40 MG tablet, TAKE 1 TABLET BY MOUTH EVERYDAY AT BEDTIME, Disp: 90 tablet, Rfl: 0 .  baclofen (LIORESAL) 10 MG tablet, TAKE ONE TABLET AT 7AM AND ONE TABLET AT 2PM AS NEEDED FOR MUSCLE SPASM, Disp: 180 tablet, Rfl: 1 .  Cholecalciferol (D3-1000) 1000 units tablet, Take 1,000 Units by mouth at bedtime. , Disp: , Rfl:  .  divalproex (DEPAKOTE)  250 MG DR tablet, Take 1 tablet (250 mg total) by mouth 2 (two) times daily., Disp: 180 tablet, Rfl: 0 .  estradiol (ESTRACE) 0.1 MG/GM vaginal cream, FOR DIRECTIONS ON HOW TO   TAKE THIS MEDICINE, READ   THE ENCLOSED MEDICATION    INFORMATION FORM, Disp: 42.5 g, Rfl: 3 .  gabapentin (NEURONTIN) 300 MG capsule, TAKE 1 CAPSULE BY MOUTH 3 TIMES A DAY, Disp: 270 capsule, Rfl: 0 .  levalbuterol (XOPENEX) 1.25 MG/3ML nebulizer solution, Take 1.25 mg by nebulization every 4 (four) hours as needed for wheezing., Disp: 72 mL, Rfl: 4 .  levothyroxine (SYNTHROID, LEVOTHROID) 50 MCG tablet, Take 50 mcg by mouth daily before breakfast., Disp: , Rfl:  .  magnesium oxide (MAG-OX)  400 MG tablet, Take 400 mg by mouth at bedtime. Reported on 07/28/2015, Disp: , Rfl:  .  metoprolol tartrate (LOPRESSOR) 25 MG tablet, Take 1 tablet (25 mg total) by mouth 2 (two) times daily., Disp: 180 tablet, Rfl: 1 .  mometasone-formoterol (DULERA) 200-5 MCG/ACT AERO, Inhale 2 puffs into the lungs 2 (two) times daily. Reported on 07/28/2015, Disp: 3 Inhaler, Rfl: 0 .  montelukast (SINGULAIR) 10 MG tablet, Take 1 tablet (10 mg total) by mouth at bedtime., Disp: 90 tablet, Rfl: 1 .  nystatin cream (MYCOSTATIN), APPLY TO CORNERS OF MOUTH 3 TIMES DAILY AFTER EATING, Disp: , Rfl: 0 .  olmesartan-hydrochlorothiazide (BENICAR HCT) 40-12.5 MG tablet, Take 1 tablet by mouth daily., Disp: 90 tablet, Rfl: 1 .  omega-3 acid ethyl esters (LOVAZA) 1 g capsule, Take 2 capsules (2 g total) by mouth daily. (Patient taking differently: Take 1 g by mouth 2 (two) times daily. ), Disp: 360 capsule, Rfl: 1 .  ondansetron (ZOFRAN) 4 MG tablet, Take 1 tablet (4 mg total) by mouth every 8 (eight) hours as needed for nausea or vomiting., Disp: 20 tablet, Rfl: 0 .  polyethylene glycol (MIRALAX / GLYCOLAX) packet, Take 17 g by mouth daily as needed., Disp: , Rfl:  .  polyethylene glycol powder (GLYCOLAX/MIRALAX) powder, 255 grams one bottle for capsule endoscopy  prep, Disp: 255 g, Rfl: 0 .  venlafaxine XR (EFFEXOR-XR) 150 MG 24 hr capsule, Take 1 capsule (150 mg total) by mouth daily with breakfast. (Patient taking differently: Take 150 mg by mouth 2 (two) times daily. ), Disp: 90 capsule, Rfl: 1 .  vitamin B-12 (CYANOCOBALAMIN) 1000 MCG tablet, Take 1,000 mcg by mouth at bedtime., Disp: , Rfl:  .  lansoprazole (PREVACID) 30 MG capsule, Take 1 capsule (30 mg total) daily by mouth., Disp: 90 capsule, Rfl: 1  Allergies  Allergen Reactions  . Morphine Nausea And Vomiting    Ok with nausea medication  . Diazepam Nausea And Vomiting  . Lyrica [Pregabalin] Other (See Comments)    Joint swelling  . Metformin And Related     diarrhea  . Latex Rash  . Rash Away  [Petrolatum-Zinc Oxide] Rash and Swelling  . Salicylates Other (See Comments)    Other reaction(s): Headache  . Tape Rash    Please use paper tape    ROS  No other specific complaints in a complete review of systems (except as listed in HPI above).  Objective  Vitals:   01/21/18 1458  BP: 130/60  Pulse: 75  Resp: 16  Temp: 97.6 F (36.4 C)  TempSrc: Oral  SpO2: 98%  Weight: 209 lb (94.8 kg)  Height: 5\' 4"  (1.626 m)     Body mass index is 35.87 kg/m.  Nursing Note and Vital Signs reviewed.  Physical Exam  Constitutional: She appears well-developed and well-nourished.  HENT:  Mouth/Throat: Uvula is midline, oropharynx is clear and moist and mucous membranes are normal. Oral lesions (area of white on frenulum and sublingual area, able to move partialy with tongue depressor but patient notes pain with touching area) present.  Eyes: Conjunctivae are normal.  Cardiovascular: Normal rate.  Pulmonary/Chest: Effort normal.  Musculoskeletal:  Limited ROM, using walker, able to stand and walk to walker on her own.  Skin: Skin is warm and dry.  Psychiatric: She has a normal mood and affect. Her behavior is normal.     No results found for this or any previous visit (from the  past 48 hour(s)).  Assessment &  Plan  1. Tongue plaque If not improved patient will call and will refer to ENT - magic mouthwash w/lidocaine SOLN; Take 5 mLs by mouth 3 (three) times daily.  Dispense: 50 mL; Refill: 0  2. Oral pain If not improved will sent to ENT - magic mouthwash w/lidocaine SOLN; Take 5 mLs by mouth 3 (three) times daily.  Dispense: 50 mL; Refill: 0  3. Chronic seasonal allergic rhinitis - montelukast (SINGULAIR) 10 MG tablet; Take 1 tablet (10 mg total) by mouth at bedtime.  Dispense: 90 tablet; Refill: 1  - discussed with patient to inform neurosurgeon of paresthesias.

## 2018-01-23 DIAGNOSIS — M5126 Other intervertebral disc displacement, lumbar region: Secondary | ICD-10-CM | POA: Diagnosis not present

## 2018-01-23 DIAGNOSIS — Z96643 Presence of artificial hip joint, bilateral: Secondary | ICD-10-CM | POA: Diagnosis not present

## 2018-01-23 DIAGNOSIS — Z981 Arthrodesis status: Secondary | ICD-10-CM | POA: Diagnosis not present

## 2018-01-23 DIAGNOSIS — Z96653 Presence of artificial knee joint, bilateral: Secondary | ICD-10-CM | POA: Diagnosis not present

## 2018-01-23 DIAGNOSIS — Z4789 Encounter for other orthopedic aftercare: Secondary | ICD-10-CM | POA: Diagnosis not present

## 2018-01-23 DIAGNOSIS — I10 Essential (primary) hypertension: Secondary | ICD-10-CM | POA: Diagnosis not present

## 2018-01-29 ENCOUNTER — Ambulatory Visit (INDEPENDENT_AMBULATORY_CARE_PROVIDER_SITE_OTHER): Payer: Medicare Other | Admitting: Vascular Surgery

## 2018-02-01 DIAGNOSIS — Z96653 Presence of artificial knee joint, bilateral: Secondary | ICD-10-CM | POA: Diagnosis not present

## 2018-02-01 DIAGNOSIS — Z981 Arthrodesis status: Secondary | ICD-10-CM | POA: Diagnosis not present

## 2018-02-01 DIAGNOSIS — Z96643 Presence of artificial hip joint, bilateral: Secondary | ICD-10-CM | POA: Diagnosis not present

## 2018-02-01 DIAGNOSIS — I10 Essential (primary) hypertension: Secondary | ICD-10-CM | POA: Diagnosis not present

## 2018-02-01 DIAGNOSIS — Z4789 Encounter for other orthopedic aftercare: Secondary | ICD-10-CM | POA: Diagnosis not present

## 2018-02-01 DIAGNOSIS — M5126 Other intervertebral disc displacement, lumbar region: Secondary | ICD-10-CM | POA: Diagnosis not present

## 2018-02-04 DIAGNOSIS — S343XXS Injury of cauda equina, sequela: Secondary | ICD-10-CM | POA: Diagnosis not present

## 2018-02-04 DIAGNOSIS — M48061 Spinal stenosis, lumbar region without neurogenic claudication: Secondary | ICD-10-CM | POA: Diagnosis not present

## 2018-02-06 ENCOUNTER — Ambulatory Visit (INDEPENDENT_AMBULATORY_CARE_PROVIDER_SITE_OTHER): Payer: Medicare Other | Admitting: Vascular Surgery

## 2018-02-07 ENCOUNTER — Ambulatory Visit (INDEPENDENT_AMBULATORY_CARE_PROVIDER_SITE_OTHER): Payer: Medicare Other | Admitting: Family Medicine

## 2018-02-07 ENCOUNTER — Encounter: Payer: Self-pay | Admitting: Family Medicine

## 2018-02-07 VITALS — BP 124/60 | HR 73 | Temp 97.9°F | Resp 16 | Ht 64.0 in | Wt 217.2 lb

## 2018-02-07 DIAGNOSIS — Z9889 Other specified postprocedural states: Secondary | ICD-10-CM

## 2018-02-07 DIAGNOSIS — R058 Other specified cough: Secondary | ICD-10-CM

## 2018-02-07 DIAGNOSIS — R05 Cough: Secondary | ICD-10-CM | POA: Diagnosis not present

## 2018-02-07 DIAGNOSIS — I209 Angina pectoris, unspecified: Secondary | ICD-10-CM

## 2018-02-07 DIAGNOSIS — J45901 Unspecified asthma with (acute) exacerbation: Secondary | ICD-10-CM

## 2018-02-07 DIAGNOSIS — J441 Chronic obstructive pulmonary disease with (acute) exacerbation: Secondary | ICD-10-CM

## 2018-02-07 MED ORDER — AZITHROMYCIN 250 MG PO TABS: ORAL_TABLET | ORAL | 0 refills | Status: DC

## 2018-02-07 MED ORDER — BENZONATATE 100 MG PO CAPS: 100.0000 mg | ORAL_CAPSULE | Freq: Three times a day (TID) | ORAL | 0 refills | Status: DC | PRN

## 2018-02-07 MED ORDER — MOMETASONE FURO-FORMOTEROL FUM 200-5 MCG/ACT IN AERO: 2.0000 | INHALATION_SPRAY | Freq: Two times a day (BID) | RESPIRATORY_TRACT | 0 refills | Status: DC

## 2018-02-07 NOTE — Progress Notes (Signed)
Name: Jacqueline Lucas   MRN: [id number]    DOB: [birth year]   Date:02/07/2018       Progress Note  Subjective  Chief Complaint  Chief Complaint  Patient presents with  . URI    productive cough with green mucous x 2 weeks. Fever has resolved.  . Groin Pain    burning on the left groin area.    HPI  Back pain: she had surgery on lumbar spine - emergency kind because of cauda equina one month ago. She states that pain is still severe, but no longer having urinary incontinence, her neurosurgeon is aware and MRI lumbar spine has been ordered and she was advised to go up on gabapentin dose. Explained that is bowel or bladder incontinence she will have to go back to [facility]  Asthma /COPD flare: she had an URI a couple of weeks ago and has productive cough, initially gray sputum now green in color, wheezing , and mild sob, fever has resolved, appetite is good. She is concerned because of recent surgery.    Patient Active Problem List   Diagnosis Date Noted  . Lumbar stenosis 02/04/2018  . Hx of hysterectomy 08/13/2017  . Pain in left wrist 06/12/2017  . Chronic venous insufficiency 06/09/2017  . Varicose veins of both lower extremities with pain 04/30/2017  . Claudication ([place]) 04/30/2017  . Arthritis of carpometacarpal [facility]) joint of left thumb 05/12/2016  . Bilateral thumb pain 05/01/2016  . Chronic bronchitis ([place]) 04/14/2016  . Anemia 02/03/2016  . Status post bilateral total hip replacement 01/10/2016  . Sicca ([place]) 01/10/2016  . Age related osteoporosis 01/10/2016  . Hip arthritis 08/16/2015  . Microscopic hematuria 07/30/2015  . Vaginal atrophy 07/30/2015  . GAD (generalized anxiety disorder) 07/08/2015  . Moderate episode of recurrent major depressive disorder ([place]) 07/08/2015  . Migraine with aura and without status migrainosus, not intractable 07/08/2015  . Dyslipidemia 07/08/2015  . Degenerative arthritis of right knee 03/16/2015  . Atrophic vaginitis 01/31/2015  .  Recurrent UTI 12/31/2014  . IBS (irritable bowel syndrome) 10/29/2014  . Asthma, moderate persistent 09/08/2014  . Allergic state 09/07/2014  . Colon polyp 09/07/2014  . Hemorrhoid 09/07/2014  . Constrictive tenosynovitis 03/12/2014  . H/O total knee replacement, bilateral 07/31/2013  . CAD (coronary artery disease) 11/28/2012  . Anxiety 11/28/2012  . Acid reflux 11/28/2012  . Cardiac murmur 11/28/2012  . BP (high blood pressure) 11/28/2012  . Cannot sleep 11/28/2012  . Adiposity 11/28/2012  . Restless leg 11/28/2012  . Supraventricular tachycardia ([place]) 11/28/2012  . Fibromyalgia 11/28/2012  . Tachycardia 11/28/2012    Past Surgical History:  Procedure Laterality Date  . ABDOMINAL HYSTERECTOMY     due to cancer of ovaries  . BACK SURGERY N/A 2014   x 5  . CARDIAC CATHETERIZATION     no PCI; 12/18/12 ([facility], Dr. Marcello Moores): EP study with ablation. No coronary angiography done then.  . COLONOSCOPY W/ POLYPECTOMY    . COLONOSCOPY WITH PROPOFOL N/A 02/04/2016   Procedure: COLONOSCOPY WITH PROPOFOL;  Surgeon: Robert Bellow, MD;  Location: [facility] ENDOSCOPY;  Service: Endoscopy;  Laterality: N/A;  . ESOPHAGOGASTRODUODENOSCOPY (EGD) WITH PROPOFOL N/A 02/04/2016   Procedure: ESOPHAGOGASTRODUODENOSCOPY (EGD) WITH PROPOFOL;  Surgeon: Robert Bellow, MD;  Location: [facility] ENDOSCOPY;  Service: Endoscopy;  Laterality: N/A;  . FINGER ARTHROSCOPY WITH CARPOMETACARPEL ([place]) ARTHROPLASTY Left 05/01/2016   Dr. Erlinda Hong  . GIVENS CAPSULE STUDY N/A 05/02/2016   Procedure: GIVENS CAPSULE STUDY;  Surgeon: Manya Silvas, MD;  Location: [facility] ENDOSCOPY;  Service: Endoscopy;  Laterality: N/A;  . HAND SURGERY    . HEMORRHOID SURGERY    . HIP ARTHROPLASTY    . JOINT REPLACEMENT Bilateral    knee  . KNEE ARTHROPLASTY    . REPLACEMENT TOTAL KNEE Left   . SPINE SURGERY     x 2  . tendonititis     right elbow, right wrist  . TOTAL HIP ARTHROPLASTY Left 2013  . TOTAL HIP ARTHROPLASTY Right 08/16/2015    Procedure: TOTAL HIP ARTHROPLASTY ANTERIOR APPROACH;  Surgeon: Meredith Pel, MD;  Location: [place];  Service: Orthopedics;  Laterality: Right;  . TOTAL KNEE ARTHROPLASTY Right 03/16/2015   Procedure: RIGHT TOTAL KNEE ARTHROPLASTY, PCL SACRIFICING;  Surgeon: Meredith Pel, MD;  Location: [place];  Service: Orthopedics;  Laterality: Right;    Family History  Problem Relation Age of Onset  . Diabetes Mother   . Hypertension Mother   . Heart disease Mother   . Alzheimer's disease Mother   . Cancer Father   . Diabetes Father   . Hypertension Sister   . Depression Sister   . Fibromyalgia Sister   . Cancer Brother   . Heart disease Brother   . Stroke Son   . Fibromyalgia Daughter   . Bladder Cancer Neg Hx   . Kidney disease Neg Hx   . Kidney cancer Neg Hx     Social History   Socioeconomic History  . Marital status: Married    Spouse name: Fritz Pickerel  . Number of children: 4  . Years of education: some college  . Highest education level: 12th grade  Occupational History  . Occupation: Retired  Scientific laboratory technician  . Financial resource strain: Not hard at all  . Food insecurity:    Worry: Never true    Inability: Never true  . Transportation needs:    Medical: No    Non-medical: No  Tobacco Use  . Smoking status: Never Smoker  . Smokeless tobacco: Never Used  . Tobacco comment: smoking cessation materials not required  Substance and Sexual Activity  . Alcohol use: No    Alcohol/week: 0.0 standard drinks  . Drug use: No  . Sexual activity: Never  Lifestyle  . Physical activity:    Days per week: 3 days    Minutes per session: 30 min  . Stress: Not at all  Relationships  . Social connections:    Talks on phone: More than three times a week    Gets together: Twice a week    Attends religious service: 1 to 4 times per year    Active member of club or organization: No    Attends meetings of clubs or organizations: Never    Relationship status: Married  . Intimate  partner violence:    Fear of current or ex partner: No    Emotionally abused: No    Physically abused: No    Forced sexual activity: No  Other Topics Concern  . Not on file  Social History Narrative   Patient has 10 grandchildren, 4 great-granddaughters & 2 great-grandsons.     Current Outpatient Medications:  .  aspirin 81 MG chewable tablet, Chew 81 mg by mouth daily., Disp: , Rfl:  .  atorvastatin (LIPITOR) 40 MG tablet, TAKE 1 TABLET BY MOUTH EVERYDAY AT BEDTIME, Disp: 90 tablet, Rfl: 0 .  baclofen (LIORESAL) 10 MG tablet, TAKE ONE TABLET AT 7AM AND ONE TABLET AT 2PM AS NEEDED FOR MUSCLE SPASM, Disp:  180 tablet, Rfl: 1 .  Cholecalciferol (D3-1000) 1000 units tablet, Take 1,000 Units by mouth at bedtime. , Disp: , Rfl:  .  divalproex (DEPAKOTE) 250 MG DR tablet, Take 1 tablet (250 mg total) by mouth 2 (two) times daily., Disp: 180 tablet, Rfl: 0 .  estradiol (ESTRACE) 0.1 MG/GM vaginal cream, FOR DIRECTIONS ON HOW TO   TAKE THIS MEDICINE, READ   THE ENCLOSED MEDICATION    INFORMATION FORM, Disp: 42.5 g, Rfl: 3 .  gabapentin (NEURONTIN) 300 MG capsule, TAKE 1 CAPSULE BY MOUTH 3 TIMES A DAY, Disp: 270 capsule, Rfl: 0 .  levalbuterol (XOPENEX) 1.25 MG/3ML nebulizer solution, Take 1.25 mg by nebulization every 4 (four) hours as needed for wheezing., Disp: 72 mL, Rfl: 4 .  levothyroxine (SYNTHROID, LEVOTHROID) 50 MCG tablet, Take 50 mcg by mouth daily before breakfast., Disp: , Rfl:  .  magnesium oxide (MAG-OX) 400 MG tablet, Take 400 mg by mouth at bedtime. Reported on 07/28/2015, Disp: , Rfl:  .  metoprolol tartrate (LOPRESSOR) 25 MG tablet, Take 1 tablet (25 mg total) by mouth 2 (two) times daily., Disp: 180 tablet, Rfl: 1 .  mometasone-formoterol (DULERA) 200-5 MCG/ACT AERO, Inhale 2 puffs into the lungs 2 (two) times daily. Reported on 07/28/2015, Disp: 3 Inhaler, Rfl: 0 .  montelukast (SINGULAIR) 10 MG tablet, Take 1 tablet (10 mg total) by mouth at bedtime., Disp: 90 tablet, Rfl: 1 .   nystatin cream (MYCOSTATIN), APPLY TO CORNERS OF MOUTH 3 TIMES DAILY AFTER EATING, Disp: , Rfl: 0 .  olmesartan-hydrochlorothiazide (BENICAR HCT) 40-12.5 MG tablet, Take 1 tablet by mouth daily., Disp: 90 tablet, Rfl: 1 .  omega-3 acid ethyl esters (LOVAZA) 1 g capsule, Take 2 capsules (2 g total) by mouth daily. (Patient taking differently: Take 1 g by mouth 2 (two) times daily. ), Disp: 360 capsule, Rfl: 1 .  ondansetron (ZOFRAN) 4 MG tablet, Take 1 tablet (4 mg total) by mouth every 8 (eight) hours as needed for nausea or vomiting., Disp: 20 tablet, Rfl: 0 .  polyethylene glycol (MIRALAX / GLYCOLAX) packet, Take 17 g by mouth daily as needed., Disp: , Rfl:  .  polyethylene glycol powder (GLYCOLAX/MIRALAX) powder, 255 grams one bottle for capsule endoscopy prep, Disp: 255 g, Rfl: 0 .  venlafaxine XR (EFFEXOR-XR) 150 MG 24 hr capsule, Take 1 capsule (150 mg total) by mouth daily with breakfast. (Patient taking differently: Take 150 mg by mouth 2 (two) times daily. ), Disp: 90 capsule, Rfl: 1 .  vitamin B-12 (CYANOCOBALAMIN) 1000 MCG tablet, Take 1,000 mcg by mouth at bedtime., Disp: , Rfl:  .  azithromycin (ZITHROMAX) 250 MG tablet, Take as directed, Disp: 6 tablet, Rfl: 0 .  benzonatate (TESSALON PERLES) 100 MG capsule, Take 1-2 capsules (100-200 mg total) by mouth 3 (three) times daily as needed for cough., Disp: 40 capsule, Rfl: 0 .  diclofenac sodium (VOLTAREN) 1 % GEL, Apply topically., Disp: , Rfl:  .  lansoprazole (PREVACID) 30 MG capsule, Take 1 capsule (30 mg total) daily by mouth., Disp: 90 capsule, Rfl: 1  Allergies  Allergen Reactions  . Morphine Nausea And Vomiting    Ok with nausea medication  . Diazepam Nausea And Vomiting  . Lyrica [Pregabalin] Other (See Comments)    Joint swelling  . Metformin And Related     diarrhea  . Latex Rash  . Rash Away  [Petrolatum-Zinc Oxide] Rash and Swelling  . Salicylates Other (See Comments)    Other reaction(s): Headache  .  Tape Rash     Please use paper tape    I personally reviewed active problem list, medication list, allergies, family history, social history with the patient/caregiver today.   ROS  Constitutional: Negative for fever or weight change.  Respiratory: Positive  for cough and intermittent  shortness of breath.   Cardiovascular: Negative for chest pain or palpitations.  Gastrointestinal: Negative for abdominal pain, no bowel changes.  Musculoskeletal: Positive  for gait problem , no joint swelling  Skin: Negative for rash.  Neurological: Negative for dizziness or headache.  No other specific complaints in a complete review of systems (except as listed in HPI above).  Objective  Vitals:   02/07/18 1126  BP: 124/60  Pulse: 73  Resp: 16  Temp: 97.9 F (36.6 C)  TempSrc: Oral  SpO2: 97%  Weight: 217 lb 3.2 oz (98.5 kg)  Height: _0  (1.626 m)    Body mass index is 37.28 kg/m.  Physical Exam  Constitutional: Patient appears well-developed and well-nourished. Obese No distress.  HEENT: head atraumatic, normocephalic, pupils equal and reactive to light,  neck supple, throat within normal limits Cardiovascular: Normal rate, regular rhythm and normal heart sounds.  No murmur heard. No BLE edema. Pulmonary/Chest: Effort normal and breath sounds normal. No respiratory distress. Muscular Skeletal: in pain, walking slowly , using walker Abdominal: Soft.  There is no tenderness. Psychiatric: Patient has a normal mood and affect. behavior is normal. Judgment and thought content normal.  Recent Results (from the past 2160 hour(s))  I-STAT creatinine     Status: None   Collection Time: 12/06/17  3:27 PM  Result Value Ref Range   Creatinine, Ser 0.80 0.44 - 1.00 mg/dL  Urinalysis, Complete     Status: None   Collection Time: 12/11/17  1:35 PM  Result Value Ref Range   Specific Gravity, UA 1.020 1.005 - 1.030   pH, UA 6.5 5.0 - 7.5   Color, UA Yellow Yellow   Appearance Ur Clear Clear    Leukocytes, UA Negative Negative   Protein, UA Negative Negative/Trace   Glucose, UA Negative Negative   Ketones, UA Negative Negative   RBC, UA Negative Negative   Bilirubin, UA Negative Negative   Urobilinogen, Ur 0.2 0.2 - 1.0 mg/dL   Nitrite, UA Negative Negative   Microscopic Examination See below:   Microscopic Examination     Status: Abnormal   Collection Time: 12/11/17  1:35 PM  Result Value Ref Range   WBC, UA None seen 0 - 5 /hpf   RBC, UA None seen 0 - 2 /hpf   Epithelial Cells (non renal) 0-10 0 - 10 /hpf   Mucus, UA Present (A) Not Estab.   Bacteria, UA Few (A) None seen/Few  COMPLETE METABOLIC PANEL WITH GFR     Status: Abnormal   Collection Time: 12/26/17 11:45 AM  Result Value Ref Range   Glucose, Bld 81 65 - 139 mg/dL    Comment: .        Non-fasting reference interval .    BUN 17 7 - 25 mg/dL   Creat 0.89 0.50 - 0.99 mg/dL    Comment: For patients >4 years of age, the reference limit for Creatinine is approximately 13% higher for people identified as African-American. .    GFR, Est Non African American 67 > OR = 60 mL/min/1.26m   GFR, Est African American 77 > OR = 60 mL/min/1.762m  BUN/Creatinine Ratio NOT APPLICABLE 6 - 22 (calc)   Sodium  139 135 - 146 mmol/L   Potassium 4.0 3.5 - 5.3 mmol/L   Chloride 104 98 - 110 mmol/L   CO2 28 20 - 32 mmol/L   Calcium 9.1 8.6 - 10.4 mg/dL   Total Protein 5.9 (L) 6.1 - 8.1 g/dL   Albumin 3.9 3.6 - 5.1 g/dL   Globulin 2.0 1.9 - 3.7 g/dL (calc)   AG Ratio 2.0 1.0 - 2.5 (calc)   Total Bilirubin 0.4 0.2 - 1.2 mg/dL   Alkaline phosphatase (APISO) 51 33 - 130 U/L   AST 12 10 - 35 U/L   ALT 13 6 - 29 U/L  Hemoglobin A1c     Status: Abnormal   Collection Time: 12/26/17 11:45 AM  Result Value Ref Range   Hgb A1c MFr Bld 6.4 (H) <5.7 % of total Hgb    Comment: For someone without known diabetes, a hemoglobin  A1c value between 5.7% and 6.4% is consistent with prediabetes and should be confirmed with a  follow-up  test. . For someone with known diabetes, a value <7% indicates that their diabetes is well controlled. A1c targets should be individualized based on duration of diabetes, age, comorbid conditions, and other considerations. . This assay result is consistent with an increased risk of diabetes. . Currently, no consensus exists regarding use of hemoglobin A1c for diagnosis of diabetes for children. .    Mean Plasma Glucose 137 (calc)   eAG (mmol/L) 7.6 (calc)  Lipid panel     Status: Abnormal   Collection Time: 12/26/17 11:45 AM  Result Value Ref Range   Cholesterol 189 <200 mg/dL   HDL 51 >50 mg/dL   Triglycerides 378 (H) <150 mg/dL    Comment: . If a non-fasting specimen was collected, consider repeat triglyceride testing on a fasting specimen if clinically indicated.  Yates Decamp et al. J. of Clin. Lipidol. 3716;9:678-938. [name]    LDL Cholesterol (Calc) 88 mg/dL (calc)    Comment: Reference range: <100 . Desirable range <100 mg/dL for primary prevention;   <70 mg/dL for patients with CHD or diabetic patients  with > or = 2 CHD risk factors. [name] LDL-C is now calculated using the Martin-Hopkins  calculation, which is a validated novel method providing  better accuracy than the Friedewald equation in the  estimation of LDL-C.  Cresenciano Genre et al. Annamaria Helling. 1017;510(25): 2061-2068  (http://education.[facility].com/faq/FAQ164)    Total CHOL/HDL Ratio 3.7 <5.0 (calc)   Non-HDL Cholesterol (Calc) 138 (H) <130 mg/dL (calc)    Comment: For patients with diabetes plus 1 major ASCVD risk  factor, treating to a non-HDL-C goal of <100 mg/dL  (LDL-C of <70 mg/dL) is considered a therapeutic  option.   VITAMIN D 25 Hydroxy (Vit-D Deficiency, Fractures)     Status: Abnormal   Collection Time: 12/26/17 11:45 AM  Result Value Ref Range   Vit D, 25-Hydroxy 27 (L) 30 - 100 ng/mL    Comment: Vitamin D Status         25-OH Vitamin D: . Deficiency:                    <20 ng/mL Insufficiency:              20 - 29 ng/mL Optimal:                 > or = 30 ng/mL . For 25-OH Vitamin D testing on patients on  D2-supplementation and patients for whom quantitation  of D2 and D3 fractions is  required, the QuestAssureD(TM) 25-OH VIT D, (D2,D3), LC/MS/MS is recommended: order  code 714 [id number] 9148 (patients >73yr). . For more information on this test, go to: http://education.[facility].com/faq/FAQ163 (This link is being provided for  informational/educational purposes only.)   CBC with Differential/Platelet     Status: Abnormal   Collection Time: 12/26/17 11:45 AM  Result Value Ref Range   WBC 6.6 3.8 - 10.8 Thousand/uL   RBC 3.86 3.80 - 5.10 Million/uL   Hemoglobin 11.8 11.7 - 15.5 g/dL   HCT 33.9 (L) 35.0 - 45.0 %   MCV 87.8 80.0 - 100.0 fL   MCH 30.6 27.0 - 33.0 pg   MCHC 34.8 32.0 - 36.0 g/dL   RDW 14.9 11.0 - 15.0 %   Platelets 229 140 - 400 Thousand/uL   MPV 10.6 7.5 - 12.5 fL   Neutro Abs 4,033 1,500 - 7,800 cells/uL   Lymphs Abs 1,762 850 - 3,900 cells/uL   WBC mixed population 634 200 - 950 cells/uL   Eosinophils Absolute 132 15 - 500 cells/uL   Basophils Absolute 40 0 - 200 cells/uL   Neutrophils Relative % 61.1 %   Total Lymphocyte 26.7 %   Monocytes Relative 9.6 %   Eosinophils Relative 2.0 %   Basophils Relative 0.6 %  Parathyroid hormone, intact (no Ca)     Status: None   Collection Time: 12/26/17 11:45 AM  Result Value Ref Range   PTH 29 14 - 64 pg/mL    Comment: . Interpretive Guide    Intact PTH           Calcium ------------------    ----------           ------- Normal Parathyroid    Normal               Normal Hypoparathyroidism    Low or Low Normal    Low Hyperparathyroidism    Primary            Normal or High       High    Secondary          High                 Normal or Low    Tertiary           High                 High Non-Parathyroid    Hypercalcemia      Low or Low Normal    High .   Urinalysis, Complete w Microscopic     Status: Abnormal    Collection Time: 01/05/18  8:47 PM  Result Value Ref Range   Color, Urine STRAW (A) YELLOW   APPearance CLEAR (A) CLEAR   Specific Gravity, Urine 1.013 1.005 - 1.030   pH 7.0 5.0 - 8.0   Glucose, UA NEGATIVE NEGATIVE mg/dL   Hgb urine dipstick NEGATIVE NEGATIVE   Bilirubin Urine NEGATIVE NEGATIVE   Ketones, ur NEGATIVE NEGATIVE mg/dL   Protein, ur NEGATIVE NEGATIVE mg/dL   Nitrite NEGATIVE NEGATIVE   Leukocytes, UA NEGATIVE NEGATIVE   WBC, UA 0-5 0 - 5 WBC/hpf   Bacteria, UA NONE SEEN NONE SEEN   Squamous Epithelial / LPF 0-5 0 - 5   Mucus PRESENT     Comment: [facility] [address] [place] [address] CBC     Status: None   Collection Time: 01/05/18  8:55 PM  Result Value Ref Range  WBC 5.7 4.0 - 10.5 K/uL   RBC 4.25 3.87 - 5.11 MIL/uL   Hemoglobin 13.1 12.0 - 15.0 g/dL   HCT 39.9 36.0 - 46.0 %   MCV 93.9 80.0 - 100.0 fL   MCH 30.8 26.0 - 34.0 pg   MCHC 32.8 30.0 - 36.0 g/dL   RDW 15.1 11.5 - 15.5 %   Platelets 236 150 - 400 K/uL   nRBC 0.0 0.0 - 0.2 %    Comment: Performed at [facility], [address] [place], [place] [address]  Basic metabolic panel     Status: Abnormal   Collection Time: 01/05/18  8:55 PM  Result Value Ref Range   Sodium 136 135 - 145 mmol/L   Potassium 4.2 3.5 - 5.1 mmol/L   Chloride 101 98 - 111 mmol/L   CO2 27 22 - 32 mmol/L   Glucose, Bld 127 (H) 70 - 99 mg/dL   BUN 20 8 - 23 mg/dL   Creatinine, Ser 0.91 0.44 - 1.00 mg/dL   Calcium 9.0 8.9 - 10.3 mg/dL   GFR calc non Af Amer >60 >60 mL/min   GFR calc Af Amer >60 >60 mL/min    Comment: (NOTE) The eGFR has been calculated using the CKD EPI equation. This calculation has not been validated in all clinical situations. eGFR's persistently <60 mL/min signify possible Chronic Kidney Disease.    Anion gap 8 5 - 15    Comment: Performed at [facility], [place], [place] [address]      PHQ2/9: Depression screen  [facility] 2/9 12/26/2017 10/08/2017 09/14/2017 08/17/2017 08/13/2017  Decreased Interest 0 0 - 0 1  Down, Depressed, Hopeless 0 0 - 0 1  PHQ - 2 Score 0 0 - 0 2  Altered sleeping 0 - 0 0 0  Tired, decreased energy 0 - 0 0 0  Change in appetite 0 - 0 0 0  Feeling bad or failure about yourself  0 - 0 0 0  Trouble concentrating 0 - 0 0 0  Moving slowly or fidgety/restless 0 - 0 0 0  Suicidal thoughts 0 - 0 0 0  PHQ-9 Score 0 - - 0 2  Difficult doing work/chores Not difficult at all - Not difficult at all Not difficult at all Not difficult at all  Some recent data might be hidden     Fall Risk: Fall Risk  01/21/2018 12/26/2017 10/08/2017 09/14/2017 08/17/2017  Falls in the past year? 0 No No No No  Number falls in past yr: - - - - -  Injury with Fall? - - - - -  Risk Factor Category  - - - - -  Risk for fall due to : - - - Impaired balance/gait;Impaired vision Impaired vision;Impaired balance/gait;Medication side effect;History of fall(s);Other (Comment)  Risk for fall due to: Comment - - - - wears eyeglasses; syncope, fibromyalgia  Follow up - - - - -     Assessment & Plan  1. Asthma exacerbation in COPD ([place])  - mometasone-formoterol (DULERA) 200-5 MCG/ACT AERO; Inhale 2 puffs into the lungs 2 (two) times daily. Reported on 07/28/2015  Dispense: 3 Inhaler; Refill: 0  2. History of recent intraspinal surgery  - benzonatate (TESSALON PERLES) 100 MG capsule; Take 1-2 capsules (100-200 mg total) by mouth 3 (three) times daily as needed for cough.  Dispense: 40 capsule; Refill: 0  3. Productive cough  - azithromycin (ZITHROMAX) 250 MG tablet; Take as directed  Dispense: 6 tablet;  Refill: 0

## 2018-02-08 DIAGNOSIS — M5126 Other intervertebral disc displacement, lumbar region: Secondary | ICD-10-CM | POA: Diagnosis not present

## 2018-02-08 DIAGNOSIS — Z4789 Encounter for other orthopedic aftercare: Secondary | ICD-10-CM | POA: Diagnosis not present

## 2018-02-08 DIAGNOSIS — Z981 Arthrodesis status: Secondary | ICD-10-CM | POA: Diagnosis not present

## 2018-02-08 DIAGNOSIS — Z96643 Presence of artificial hip joint, bilateral: Secondary | ICD-10-CM | POA: Diagnosis not present

## 2018-02-08 DIAGNOSIS — Z96653 Presence of artificial knee joint, bilateral: Secondary | ICD-10-CM | POA: Diagnosis not present

## 2018-02-08 DIAGNOSIS — I10 Essential (primary) hypertension: Secondary | ICD-10-CM | POA: Diagnosis not present

## 2018-02-18 DIAGNOSIS — M48061 Spinal stenosis, lumbar region without neurogenic claudication: Secondary | ICD-10-CM | POA: Diagnosis not present

## 2018-02-18 DIAGNOSIS — R9089 Other abnormal findings on diagnostic imaging of central nervous system: Secondary | ICD-10-CM | POA: Diagnosis not present

## 2018-02-18 DIAGNOSIS — Z967 Presence of other bone and tendon implants: Secondary | ICD-10-CM | POA: Diagnosis not present

## 2018-02-18 DIAGNOSIS — M5126 Other intervertebral disc displacement, lumbar region: Secondary | ICD-10-CM | POA: Diagnosis not present

## 2018-02-18 DIAGNOSIS — S343XXA Injury of cauda equina, initial encounter: Secondary | ICD-10-CM | POA: Diagnosis not present

## 2018-02-18 DIAGNOSIS — M3501 Sicca syndrome with keratoconjunctivitis: Secondary | ICD-10-CM | POA: Diagnosis not present

## 2018-02-18 DIAGNOSIS — M9953 Intervertebral disc stenosis of neural canal of lumbar region: Secondary | ICD-10-CM | POA: Diagnosis not present

## 2018-02-20 ENCOUNTER — Other Ambulatory Visit: Payer: Self-pay | Admitting: Family Medicine

## 2018-02-20 DIAGNOSIS — F3160 Bipolar disorder, current episode mixed, unspecified: Secondary | ICD-10-CM

## 2018-02-21 NOTE — Telephone Encounter (Signed)
Refill request for general medication: Depakote 250 mg  Last office visit: 02/07/2018  Last physical exam: 08/17/2017  Follow-ups on file. 06/26/2018

## 2018-02-25 ENCOUNTER — Telehealth: Payer: Self-pay

## 2018-02-25 NOTE — Telephone Encounter (Signed)
Patient's husband called requesting a refill of gabapentin to be sent to the mail order pharmacy. Patient's husband states patient has been taking 3 capsules 3 times daily. Please advise.

## 2018-02-26 NOTE — Telephone Encounter (Signed)
Patient's husband called requesting a refill of gabapentin to be sent to the mail order pharmacy. Patient's husband states patient has been taking 3 capsules 3 times daily. Please advise.   Last Visit: 11/20/17 Next visit: 05/23/18  Last office note on 11/20/17 states She will continue taking gabapentin 300 mg 3 times daily.  What dose of Gabapentin should be sent to the pharmacy?

## 2018-02-26 NOTE — Telephone Encounter (Signed)
Patient advised we will send prescription as it is written in the note. Patient has made an appointment to discuss the dose with Dr. Estanislado Pandy. Patient has asked [place] to hold off sending prescription until after appointment.

## 2018-02-26 NOTE — Telephone Encounter (Signed)
Please reorder the last prescription that Dr. Estanislado Pandy refilled: Gabapentin 300 mg 1 capsule by mouth TID.

## 2018-03-01 NOTE — Progress Notes (Signed)
Office Visit Note  Patient: Jacqueline Lucas             Date of Birth: [birth year]           MRN: [id number]             PCP: [name], MD Referring: [name], MD Visit Date: 03/06/2018 Occupation: @GUAROCC @  Subjective:  Increased generalized all over.[name]   History of Present Illness: Jacqueline Lucas is a 68 y.o. female history of fibromyalgia, osteoarthritis, and DDD lumbar spine.  Patient reports that about 18 weeks ago she had lumbar spine surgery for severe lower back pain.  She continues to have lower back discomfort.  She has been also having generalized discomfort from fibromyalgia flares.  She Describes pain in bilateral trapezius area thoracic region and her lower extremities.  She has been also having discomfort in bilateral trochanteric area.  Activities of Daily Living:  Patient reports morning stiffness for 15 minutes.   Patient Reports nocturnal pain.  Difficulty dressing/grooming: Reports Difficulty climbing stairs: Reports Difficulty getting out of chair: Reports Difficulty using hands for taps, buttons, cutlery, and/or writing: Denies  Review of Systems  Constitutional: Positive for fatigue. Negative for night sweats, weight gain and weight loss.  HENT: Negative for mouth sores, trouble swallowing, trouble swallowing, mouth dryness and nose dryness.   Eyes: Negative for pain, redness, visual disturbance and dryness.  Respiratory: Negative for cough, shortness of breath and difficulty breathing.   Cardiovascular: Negative for chest pain, palpitations, hypertension, irregular heartbeat and swelling in legs/feet.  Gastrointestinal: Positive for constipation, diarrhea and heartburn. Negative for blood in stool.       H/o IBS  Endocrine: Negative for increased urination.  Genitourinary: Negative for vaginal dryness.  Musculoskeletal: Positive for arthralgias, joint pain, myalgias, morning stiffness and myalgias. Negative for joint swelling, muscle  weakness and muscle tenderness.  Skin: Negative for color change, rash, hair loss, skin tightness, ulcers and sensitivity to sunlight.  Allergic/Immunologic: Negative for susceptible to infections.  Neurological: Negative for dizziness, memory loss, night sweats and weakness.  Hematological: Negative for swollen glands.  Psychiatric/Behavioral: Positive for depressed mood and sleep disturbance. The patient is nervous/anxious.     PMFS History:  Patient Active Problem List   Diagnosis Date Noted  . Lumbar stenosis 02/04/2018  . Hx of hysterectomy 08/13/2017  . Pain in left wrist 06/12/2017  . Chronic venous insufficiency 06/09/2017  . Varicose veins of both lower extremities with pain 04/30/2017  . Claudication ([place]) 04/30/2017  . Arthritis of carpometacarpal [facility]) joint of left thumb 05/12/2016  . Bilateral thumb pain 05/01/2016  . Chronic bronchitis ([place]) 04/14/2016  . Anemia 02/03/2016  . Status post bilateral total hip replacement 01/10/2016  . Sicca ([place]) 01/10/2016  . Age related osteoporosis 01/10/2016  . Hip arthritis 08/16/2015  . Microscopic hematuria 07/30/2015  . Vaginal atrophy 07/30/2015  . GAD (generalized anxiety disorder) 07/08/2015  . Moderate episode of recurrent major depressive disorder ([place]) 07/08/2015  . Migraine with aura and without status migrainosus, not intractable 07/08/2015  . Dyslipidemia 07/08/2015  . Degenerative arthritis of right knee 03/16/2015  . Atrophic vaginitis 01/31/2015  . Recurrent UTI 12/31/2014  . IBS (irritable bowel syndrome) 10/29/2014  . Asthma, moderate persistent 09/08/2014  . Allergic state 09/07/2014  . Colon polyp 09/07/2014  . Hemorrhoid 09/07/2014  . Constrictive tenosynovitis 03/12/2014  . H/O total knee replacement, bilateral 07/31/2013  . CAD (coronary artery disease) 11/28/2012  . Anxiety 11/28/2012  . Acid reflux 11/28/2012  .  Cardiac murmur 11/28/2012  . BP (high blood pressure) 11/28/2012  . Cannot sleep  11/28/2012  . Adiposity 11/28/2012  . Restless leg 11/28/2012  . Supraventricular tachycardia ([place]) 11/28/2012  . Fibromyalgia 11/28/2012  . Tachycardia 11/28/2012    Past Medical History:  Diagnosis Date  . Allergy   . Allergy to adhesive tape    Allergy to paper tape as well  . Alzheimer disease ([place])   . Anemia   . Anginal pain ([place])   . Anxiety   . Arthritis   . Asthma   . Bronchitis   . Cataract   . Chronic nausea   . Claustrophobia   . DDD (degenerative disc disease), lumbar   . Depression   . Dry eyes    uses Restasis  . Fibromyalgia   . GERD (gastroesophageal reflux disease)   . H/O bladder infections   . Heart murmur   . Hematuria   . Hyperlipidemia   . Hypertension   . Hypothyroidism   . IBS (irritable bowel syndrome)   . Insomnia   . Lumbar neuritis   . Migraines   . Obesity   . Osteoporosis   . Palpitation   . PONV (postoperative nausea and vomiting)   . RLS (restless legs syndrome)   . Shoulder fracture, right   . Sicca ([place])   . Tachycardia   . Thyroid disease     Family History  Problem Relation Age of Onset  . Diabetes Mother   . Hypertension Mother   . Heart disease Mother   . Alzheimer's disease Mother   . Cancer Father   . Diabetes Father   . Hypertension Sister   . Depression Sister   . Fibromyalgia Sister   . Cancer Brother   . Heart disease Brother   . Stroke Son   . Fibromyalgia Daughter   . Bladder Cancer Neg Hx   . Kidney disease Neg Hx   . Kidney cancer Neg Hx    Past Surgical History:  Procedure Laterality Date  . ABDOMINAL HYSTERECTOMY     due to cancer of ovaries  . BACK SURGERY N/A 2014   x 5  . BACK SURGERY  12/2017  . CARDIAC CATHETERIZATION     no PCI; 12/18/12 ([facility], Dr. Marcello Moores): EP study with ablation. No coronary angiography done then.  . COLONOSCOPY W/ POLYPECTOMY    . COLONOSCOPY WITH PROPOFOL N/A 02/04/2016   Procedure: COLONOSCOPY WITH PROPOFOL;  Surgeon: Robert Bellow, MD;  Location: [facility]  ENDOSCOPY;  Service: Endoscopy;  Laterality: N/A;  . ESOPHAGOGASTRODUODENOSCOPY (EGD) WITH PROPOFOL N/A 02/04/2016   Procedure: ESOPHAGOGASTRODUODENOSCOPY (EGD) WITH PROPOFOL;  Surgeon: Robert Bellow, MD;  Location: [facility] ENDOSCOPY;  Service: Endoscopy;  Laterality: N/A;  . FINGER ARTHROSCOPY WITH CARPOMETACARPEL ([place]) ARTHROPLASTY Left 05/01/2016   Dr. Erlinda Hong  . GIVENS CAPSULE STUDY N/A 05/02/2016   Procedure: GIVENS CAPSULE STUDY;  Surgeon: Manya Silvas, MD;  Location: [facility] ENDOSCOPY;  Service: Endoscopy;  Laterality: N/A;  . HAND SURGERY    . HEMORRHOID SURGERY    . HIP ARTHROPLASTY    . JOINT REPLACEMENT Bilateral    knee  . KNEE ARTHROPLASTY    . REPLACEMENT TOTAL KNEE Left   . SPINE SURGERY     x 2  . tendonititis     right elbow, right wrist  . TOTAL HIP ARTHROPLASTY Left 2013  . TOTAL HIP ARTHROPLASTY Right 08/16/2015   Procedure: TOTAL HIP ARTHROPLASTY ANTERIOR APPROACH;  Surgeon: Meredith Pel, MD;  Location: [place];  Service: Orthopedics;  Laterality: Right;  . TOTAL KNEE ARTHROPLASTY Right 03/16/2015   Procedure: RIGHT TOTAL KNEE ARTHROPLASTY, PCL SACRIFICING;  Surgeon: Meredith Pel, MD;  Location: [place];  Service: Orthopedics;  Laterality: Right;   Social History   Social History Narrative   Patient has 10 grandchildren, 4 great-granddaughters & 2 great-grandsons.    Objective: Vital Signs: BP 139/67 (BP Location: Left Arm, Patient Position: Sitting, Cuff Size: Normal)   Pulse 61   Resp 15   Ht 5\' 4"  (1.626 m)   Wt 222 lb 3.2 oz (100.8 kg)   BMI 38.14 kg/m    Physical Exam Vitals signs and nursing note reviewed.  Constitutional:      Appearance: She is well-developed.  HENT:     Head: Normocephalic and atraumatic.  Eyes:     Conjunctiva/sclera: Conjunctivae normal.  Neck:     Musculoskeletal: Normal range of motion.  Cardiovascular:     Rate and Rhythm: Normal rate and regular rhythm.     Heart sounds: Normal heart sounds.  Pulmonary:      Effort: Pulmonary effort is normal.     Breath sounds: Normal breath sounds.  Abdominal:     General: Bowel sounds are normal.     Palpations: Abdomen is soft.  Lymphadenopathy:     Cervical: No cervical adenopathy.  Skin:    General: Skin is warm and dry.     Capillary Refill: Capillary refill takes less than 2 seconds.  Neurological:     Mental Status: She is alert and oriented to person, place, and time.  Psychiatric:        Behavior: Behavior normal.      Musculoskeletal Exam: C-spine good range of motion.  She has painful limited range of motion of her lumbar spine.  Shoulder joints elbow joints wrist joints with good range of motion.  She has some DIP and PIP thickening in her hands.  She had bilateral total knee replacement which appears to be doing well.  She had tenderness over bilateral trochanteric bursa.  She has generalized hyperalgesia and positive tender points due to fibromyalgia.  CDAI Exam: CDAI Score: Not documented Patient Global Assessment: Not documented; Provider Global Assessment: Not documented Swollen: Not documented; Tender: Not documented Joint Exam   Not documented   There is currently no information documented on the homunculus. Go to the Rheumatology activity and complete the homunculus joint exam.  Investigation: No additional findings.  Imaging: No results found.  Recent Labs: Lab Results  Component Value Date   WBC 5.7 01/05/2018   HGB 13.1 01/05/2018   PLT 236 01/05/2018   NA 136 01/05/2018   K 4.2 01/05/2018   CL 101 01/05/2018   CO2 27 01/05/2018   GLUCOSE 127 (H) 01/05/2018   BUN 20 01/05/2018   CREATININE 0.91 01/05/2018   BILITOT 0.4 12/26/2017   ALKPHOS 105 12/10/2015   AST 12 12/26/2017   ALT 13 12/26/2017   PROT 5.9 (L) 12/26/2017   ALBUMIN 4.0 12/10/2015   CALCIUM 9.0 01/05/2018   GFRAA >60 01/05/2018    Speciality Comments: No specialty comments available.  Procedures:  Large Joint Inj: bilateral greater  trochanter on 03/06/2018 4:04 PM Indications: pain Details: 27 G 1.5 in needle, lateral approach  Arthrogram: No  Medications (Right): 1.5 mL lidocaine 1 %; 40 mg triamcinolone acetonide 40 MG/ML Medications (Left): 1.5 mL lidocaine 1 %; 40 mg triamcinolone acetonide 40 MG/ML Outcome: tolerated well, no immediate complications Procedure, treatment  alternatives, risks and benefits explained, specific risks discussed. Consent was given by the patient. Immediately prior to procedure a time out was called to verify the correct patient, procedure, equipment, support staff and site/side marked as required. Patient was prepped and draped in the usual sterile fashion.     Allergies: Morphine; Diazepam; Lyrica [pregabalin]; Metformin and related; Latex; Rash away  [petrolatum-zinc oxide]; Salicylates; and Tape   Assessment / Plan:     Visit Diagnoses: Fibromyalgia -patient states she has been experiencing increased pain and discomfort and fibromyalgia flares with the weather change.  She has been also experiencing increased lower back pain.  She has difficulty sleeping at night due to lower back pain.  She would like to increase the dose of gabapentin.  Side effects of gabapentin were discussed at length.  We discussed increasing gabapentin to 300 in the morning 300 at noontime and then 2 tablets at bedtime.  She was in agreement.  I will send the prescription today.  I have also advised her to take baclofen on 3 times daily PRN basis.  She may add Tylenol twice daily as needed as needed for pain management.  Trochanteric bursitis of both hips-she has been having pain in bilateral trochanteric bursa and difficulty sleeping at nighttime.  Today after informed consent was obtained bilateral trochanteric bursa were injected with cortisone which she tolerated well the procedure as described above.  DDD (degenerative disc disease), lumbar-patient states she has history of a spinal stenosis and had emergent  surgery about 18 weeks ago in [place].  She has been continues to have lower back pain.  I have advised her to discuss possible use of TENS unit with her neurosurgeon.  Status post bilateral total hip replacement-chronic pain  H/O total knee replacement, bilateral-chronic pain  Primary osteoarthritis of first carpometacarpal joint of left hand-she has some discomfort.  Keratoconjunctivitis sicca (HCC)  Osteopenia of multiple sites  History of anemia  History of cardiac murmur  Restless leg  History of depression  History of anxiety  History of gastroesophageal reflux (GERD)  History of hypertension -patient blood pressure was initially elevated.  She stated that she took blood pressure medication is coming to the office and her blood repeat blood pressure reading was better.  Orders: Orders Placed This Encounter  Procedures  . Large Joint Inj   Meds ordered this encounter  Medications  . gabapentin (NEURONTIN) 300 MG capsule    Sig: Take 1 capsule (300mg ) in the morning, 1 capsule (300mg ) at noon, and 2 capsules (600mg ) in the evening    Dispense:  360 capsule    Refill:  0    Face-to-face time spent with patient was 35 minutes. Greater than 50% of time was spent in counseling and coordination of care.  Follow-Up Instructions: Return in about 6 months (around 09/04/2018) for Myalgia, osteoarthritis, DDD.   Bo Merino, MD  Note - This record has been created using Editor, commissioning.  Chart creation errors have been sought, but may not always  have been located. Such creation errors do not reflect on  the standard of medical care.

## 2018-03-02 ENCOUNTER — Other Ambulatory Visit: Payer: Self-pay | Admitting: Family Medicine

## 2018-03-02 ENCOUNTER — Other Ambulatory Visit: Payer: Self-pay | Admitting: Rheumatology

## 2018-03-02 DIAGNOSIS — E785 Hyperlipidemia, unspecified: Secondary | ICD-10-CM

## 2018-03-06 ENCOUNTER — Encounter: Payer: Self-pay | Admitting: Rheumatology

## 2018-03-06 ENCOUNTER — Ambulatory Visit (INDEPENDENT_AMBULATORY_CARE_PROVIDER_SITE_OTHER): Payer: Medicare Other | Admitting: Rheumatology

## 2018-03-06 VITALS — BP 139/67 | HR 61 | Resp 15 | Ht 64.0 in | Wt 222.2 lb

## 2018-03-06 DIAGNOSIS — Z8679 Personal history of other diseases of the circulatory system: Secondary | ICD-10-CM

## 2018-03-06 DIAGNOSIS — G2581 Restless legs syndrome: Secondary | ICD-10-CM

## 2018-03-06 DIAGNOSIS — M7061 Trochanteric bursitis, right hip: Secondary | ICD-10-CM | POA: Diagnosis not present

## 2018-03-06 DIAGNOSIS — Z96643 Presence of artificial hip joint, bilateral: Secondary | ICD-10-CM | POA: Diagnosis not present

## 2018-03-06 DIAGNOSIS — Z8659 Personal history of other mental and behavioral disorders: Secondary | ICD-10-CM

## 2018-03-06 DIAGNOSIS — M5136 Other intervertebral disc degeneration, lumbar region: Secondary | ICD-10-CM

## 2018-03-06 DIAGNOSIS — M8589 Other specified disorders of bone density and structure, multiple sites: Secondary | ICD-10-CM | POA: Diagnosis not present

## 2018-03-06 DIAGNOSIS — M7062 Trochanteric bursitis, left hip: Secondary | ICD-10-CM

## 2018-03-06 DIAGNOSIS — M797 Fibromyalgia: Secondary | ICD-10-CM

## 2018-03-06 DIAGNOSIS — Z96653 Presence of artificial knee joint, bilateral: Secondary | ICD-10-CM | POA: Diagnosis not present

## 2018-03-06 DIAGNOSIS — Z8719 Personal history of other diseases of the digestive system: Secondary | ICD-10-CM

## 2018-03-06 DIAGNOSIS — M1812 Unilateral primary osteoarthritis of first carpometacarpal joint, left hand: Secondary | ICD-10-CM

## 2018-03-06 DIAGNOSIS — M3501 Sicca syndrome with keratoconjunctivitis: Secondary | ICD-10-CM | POA: Diagnosis not present

## 2018-03-06 DIAGNOSIS — Z862 Personal history of diseases of the blood and blood-forming organs and certain disorders involving the immune mechanism: Secondary | ICD-10-CM | POA: Diagnosis not present

## 2018-03-06 MED ORDER — TRIAMCINOLONE ACETONIDE 40 MG/ML IJ SUSP
40.0000 mg | INTRAMUSCULAR | Status: AC | PRN
  Administered 2018-03-06: 40 mg via INTRA_ARTICULAR

## 2018-03-06 MED ORDER — LIDOCAINE HCL 1 % IJ SOLN
1.5000 mL | INTRAMUSCULAR | Status: AC | PRN
  Administered 2018-03-06: 1.5 mL

## 2018-03-06 MED ORDER — GABAPENTIN 300 MG PO CAPS: ORAL_CAPSULE | ORAL | 0 refills | Status: DC

## 2018-03-24 DIAGNOSIS — N390 Urinary tract infection, site not specified: Secondary | ICD-10-CM | POA: Diagnosis not present

## 2018-03-24 DIAGNOSIS — B001 Herpesviral vesicular dermatitis: Secondary | ICD-10-CM | POA: Diagnosis not present

## 2018-03-24 DIAGNOSIS — R300 Dysuria: Secondary | ICD-10-CM | POA: Diagnosis not present

## 2018-03-24 DIAGNOSIS — F411 Generalized anxiety disorder: Secondary | ICD-10-CM | POA: Diagnosis not present

## 2018-03-27 ENCOUNTER — Other Ambulatory Visit: Payer: Self-pay | Admitting: Family Medicine

## 2018-03-27 NOTE — Telephone Encounter (Signed)
Is she taking once or twice a day

## 2018-03-28 NOTE — Telephone Encounter (Signed)
Pt calling.  States that her husband passed away on 04-08-2022.  Pt states that she has three weeks left.  Pt states that she does take the Effexor one time a day.

## 2018-03-28 NOTE — Telephone Encounter (Signed)
Called patient to verify if how she is taking her Effexor. Is she taking it once or twice a day. Spoke with granddaughter she will find out and call back.

## 2018-04-12 DIAGNOSIS — E039 Hypothyroidism, unspecified: Secondary | ICD-10-CM | POA: Diagnosis not present

## 2018-04-16 ENCOUNTER — Ambulatory Visit (INDEPENDENT_AMBULATORY_CARE_PROVIDER_SITE_OTHER): Payer: Medicare Other | Admitting: Nurse Practitioner

## 2018-04-16 ENCOUNTER — Encounter: Payer: Self-pay | Admitting: Nurse Practitioner

## 2018-04-16 VITALS — BP 114/62 | HR 75 | Temp 97.5°F | Resp 16 | Ht 64.0 in | Wt 212.4 lb

## 2018-04-16 DIAGNOSIS — N3000 Acute cystitis without hematuria: Secondary | ICD-10-CM | POA: Diagnosis not present

## 2018-04-16 DIAGNOSIS — F410 Panic disorder [episodic paroxysmal anxiety] without agoraphobia: Secondary | ICD-10-CM

## 2018-04-16 DIAGNOSIS — B009 Herpesviral infection, unspecified: Secondary | ICD-10-CM

## 2018-04-16 DIAGNOSIS — B002 Herpesviral gingivostomatitis and pharyngotonsillitis: Secondary | ICD-10-CM

## 2018-04-16 DIAGNOSIS — F3160 Bipolar disorder, current episode mixed, unspecified: Secondary | ICD-10-CM

## 2018-04-16 DIAGNOSIS — R300 Dysuria: Secondary | ICD-10-CM | POA: Diagnosis not present

## 2018-04-16 LAB — POCT URINALYSIS DIPSTICK
Bilirubin, UA: NEGATIVE
Blood, UA: NEGATIVE
Glucose, UA: NEGATIVE
Ketones, UA: NEGATIVE
Nitrite, UA: NEGATIVE
Protein, UA: NEGATIVE
Spec Grav, UA: 1.01 (ref 1.010–1.025)
Urobilinogen, UA: 0.2 E.U./dL
pH, UA: 7 (ref 5.0–8.0)

## 2018-04-16 MED ORDER — VALACYCLOVIR HCL 1 G PO TABS: 500.0000 mg | ORAL_TABLET | Freq: Two times a day (BID) | ORAL | 0 refills | Status: DC

## 2018-04-16 MED ORDER — HYDROXYZINE PAMOATE 25 MG PO CAPS: 25.0000 mg | ORAL_CAPSULE | Freq: Three times a day (TID) | ORAL | 0 refills | Status: DC | PRN

## 2018-04-16 MED ORDER — CEPHALEXIN 500 MG PO CAPS: 500.0000 mg | ORAL_CAPSULE | Freq: Two times a day (BID) | ORAL | 0 refills | Status: DC

## 2018-04-16 NOTE — Progress Notes (Signed)
Name: Jacqueline Lucas MRN: [id number] DOB: 1950-09-08 Date:04/16/2018 Progress Note Subjective Chief Complaint Chief Complaint  Patient presents with  . Urinary Tract Infection    patient presents with burning upon urination since last night. patient went to the walk-in clinic at [facility] on the 26th for the sam sx. a urine cx was done.   . Anxiety    she recently lost her husband due to pancreatic cancer  . Mouth Lesions    patient would like a refill of the valtrex   HPI Patient presents to the clinic with burning with urination and itchiness, since last night. She went to the walk in clinic on 1/26 with similar symptoms and was prescribed ciprofloxacin. Urine culture done on 1/26 revealed urogenital flora. She was last seen by urologist on 10/15 for recurrent UTIs, and doctor prescribed vaginal estrogen cream and urinalysis negative during this encounter. Patient has not been using vaginal estrogen cream the past few weeks due to forgetting due to stress with loss of spouse. She has taken azo with some relief endorses burning with urination. Pt denies fever or chills.   Patient also has cold sores. Patient states that this happen 2-3 times a years. She is afraid of giving it to her great-grandson. Has taken Valtrex as needed, however she needs a refill.   Patient recently lost her husband due to pancreatic cancer. Endorses situational depression and anxiety. Takes effexor daily. Has had a few panic attacks, especially when trying to fill out paperwork related to husband's death. Was prescribed Xanax 0.5 mg tablet and takes a half a tablet as needed and it has helped. Only has a few pills left. Pt denies SI/HI. States that she tries to practice deep breathing and focus on another thought during these acute episodes. Her husbands job has a program that she plans to join for people that have lost a spouse. Right now she has good family support- states her family is in town and staying with her and this  provides her significant comfort.   Depression screen PHQ 2/9 04/16/2018  Decreased Interest 2  Down, Depressed, Hopeless 2  PHQ - 2 Score 4  Altered sleeping 3  Tired, decreased energy 3  Change in appetite 3  Feeling bad or failure about yourself  0  Trouble concentrating 3  Moving slowly or fidgety/restless 1  Suicidal thoughts 0  PHQ-9 Score 17  Difficult doing work/chores Very difficult  Some recent data might be hidden   GAD 7 : Generalized Anxiety Score 04/16/2018 12/26/2017 08/13/2017 01/11/2017  Nervous, Anxious, on Edge 2 0 1 0  Control/stop worrying 1 0 1 0  Worry too much - different things 1 0 1 0  Trouble relaxing 3 0 1 0  Restless 1 0 1 0  Easily annoyed or irritable 1 0 1 0  Afraid - awful might happen 2 0 1 0  Total GAD 7 Score 11 0 7 0  Anxiety Difficulty Very difficult Not difficult at all - Not difficult at all    Patient Active Problem List   Diagnosis Date Noted  . Lumbar stenosis 02/04/2018  . Hx of hysterectomy 08/13/2017  . Pain in left wrist 06/12/2017  . Chronic venous insufficiency 06/09/2017  . Varicose veins of both lower extremities with pain 04/30/2017  . Claudication ([place]) 04/30/2017  . Arthritis of carpometacarpal [facility]) joint of left thumb 05/12/2016  . Bilateral thumb pain 05/01/2016  . Chronic bronchitis ([place]) 04/14/2016  . Anemia 02/03/2016  . Status post  bilateral total hip replacement 01/10/2016  . Sicca ([place]) 01/10/2016  . Age related osteoporosis 01/10/2016  . Hip arthritis 08/16/2015  . Microscopic hematuria 07/30/2015  . Vaginal atrophy 07/30/2015  . GAD (generalized anxiety disorder) 07/08/2015  . Moderate episode of recurrent major depressive disorder ([place]) 07/08/2015  . Migraine with aura and without status migrainosus, not intractable 07/08/2015  . Dyslipidemia 07/08/2015  . Degenerative arthritis of right knee 03/16/2015  . Atrophic vaginitis 01/31/2015  . Recurrent UTI 12/31/2014  . IBS (irritable bowel syndrome)  10/29/2014  . Asthma, moderate persistent 09/08/2014  . Allergic state 09/07/2014  . Colon polyp 09/07/2014  . Hemorrhoid 09/07/2014  . Constrictive tenosynovitis 03/12/2014  . H/O total knee replacement, bilateral 07/31/2013  . CAD (coronary artery disease) 11/28/2012  . Anxiety 11/28/2012  . Acid reflux 11/28/2012  . Cardiac murmur 11/28/2012  . BP (high blood pressure) 11/28/2012  . Cannot sleep 11/28/2012  . Adiposity 11/28/2012  . Restless leg 11/28/2012  . Supraventricular tachycardia ([place]) 11/28/2012  . Fibromyalgia 11/28/2012  . Tachycardia 11/28/2012   Past Medical History:  Diagnosis Date  . Allergy   . Allergy to adhesive tape    Allergy to paper tape as well  . Alzheimer disease ([place])   . Anemia   . Anginal pain ([place])   . Anxiety   . Arthritis   . Asthma   . Bronchitis   . Cataract   . Chronic nausea   . Claustrophobia   . DDD (degenerative disc disease), lumbar   . Depression   . Dry eyes    uses Restasis  . Fibromyalgia   . GERD (gastroesophageal reflux disease)   . H/O bladder infections   . Heart murmur   . Hematuria   . Hyperlipidemia   . Hypertension   . Hypothyroidism   . IBS (irritable bowel syndrome)   . Insomnia   . Lumbar neuritis   . Migraines   . Obesity   . Osteoporosis   . Palpitation   . PONV (postoperative nausea and vomiting)   . RLS (restless legs syndrome)   . Shoulder fracture, right   . Sicca ([place])   . Tachycardia   . Thyroid disease    Past Surgical History:  Procedure Laterality Date  . ABDOMINAL HYSTERECTOMY     due to cancer of ovaries  . BACK SURGERY N/A 2014   x 5  . BACK SURGERY  12/2017  . CARDIAC CATHETERIZATION     no PCI; 12/18/12 ([facility], Dr. Marcello Moores): EP study with ablation. No coronary angiography done then.  . COLONOSCOPY W/ POLYPECTOMY    . COLONOSCOPY WITH PROPOFOL N/A 02/04/2016   Procedure: COLONOSCOPY WITH PROPOFOL;  Surgeon: Robert Bellow, MD;  Location: [facility] ENDOSCOPY;  Service: Endoscopy;   Laterality: N/A;  . ESOPHAGOGASTRODUODENOSCOPY (EGD) WITH PROPOFOL N/A 02/04/2016   Procedure: ESOPHAGOGASTRODUODENOSCOPY (EGD) WITH PROPOFOL;  Surgeon: Robert Bellow, MD;  Location: [facility] ENDOSCOPY;  Service: Endoscopy;  Laterality: N/A;  . FINGER ARTHROSCOPY WITH CARPOMETACARPEL ([place]) ARTHROPLASTY Left 05/01/2016   Dr. Erlinda Hong  . GIVENS CAPSULE STUDY N/A 05/02/2016   Procedure: GIVENS CAPSULE STUDY;  Surgeon: Manya Silvas, MD;  Location: [facility] ENDOSCOPY;  Service: Endoscopy;  Laterality: N/A;  . HAND SURGERY    . HEMORRHOID SURGERY    . HIP ARTHROPLASTY    . JOINT REPLACEMENT Bilateral    knee  . KNEE ARTHROPLASTY    . REPLACEMENT TOTAL KNEE Left   . SPINE SURGERY     x 2  .  tendonititis     right elbow, right wrist  . TOTAL HIP ARTHROPLASTY Left 2013  . TOTAL HIP ARTHROPLASTY Right 08/16/2015   Procedure: TOTAL HIP ARTHROPLASTY ANTERIOR APPROACH;  Surgeon: Meredith Pel, MD;  Location: [place];  Service: Orthopedics;  Laterality: Right;  . TOTAL KNEE ARTHROPLASTY Right 03/16/2015   Procedure: RIGHT TOTAL KNEE ARTHROPLASTY, PCL SACRIFICING;  Surgeon: Meredith Pel, MD;  Location: [place];  Service: Orthopedics;  Laterality: Right;   Social History   Tobacco Use  . Smoking status: Never Smoker  . Smokeless tobacco: Never Used  . Tobacco comment: smoking cessation materials not required  Substance Use Topics  . Alcohol use: No    Alcohol/week: 0.0 standard drinks    Current Outpatient Medications:  .  ALPRAZolam (XANAX) 0.25 MG tablet, , Disp: , Rfl:  .  aspirin 81 MG chewable tablet, Chew 81 mg by mouth daily., Disp: , Rfl:  .  atorvastatin (LIPITOR) 40 MG tablet, TAKE 1 TABLET BY MOUTH EVERYDAY AT BEDTIME, Disp: 90 tablet, Rfl: 0 .  baclofen (LIORESAL) 10 MG tablet, TAKE ONE TABLET AT 7AM AND ONE TABLET AT 2PM AS NEEDED FOR MUSCLE SPASM, Disp: 180 tablet, Rfl: 1 .  cephALEXin (KEFLEX) 500 MG capsule, Take 1 capsule (500 mg total) by mouth 2 (two) times daily., Disp: 14  capsule, Rfl: 0 .  Cholecalciferol (D3-1000) 1000 units tablet, Take 1,000 Units by mouth at bedtime. , Disp: , Rfl:  .  diclofenac sodium (VOLTAREN) 1 % GEL, Apply topically., Disp: , Rfl:  .  divalproex (DEPAKOTE) 250 MG DR tablet, TAKE 1 TABLET TWICE A DAY, Disp: 180 tablet, Rfl: 1 .  estradiol (ESTRACE) 0.1 MG/GM vaginal cream, FOR DIRECTIONS ON HOW TO   TAKE THIS MEDICINE, READ   THE ENCLOSED MEDICATION    INFORMATION FORM, Disp: 42.5 g, Rfl: 3 .  gabapentin (NEURONTIN) 300 MG capsule, Take 1 capsule (300mg ) in the morning, 1 capsule (300mg ) at noon, and 2 capsules (600mg ) in the evening, Disp: 360 capsule, Rfl: 0 .  hydrOXYzine (VISTARIL) 25 MG capsule, Take 1 capsule (25 mg total) by mouth 3 (three) times daily as needed for anxiety., Disp: 30 capsule, Rfl: 0 .  lansoprazole (PREVACID) 30 MG capsule, Take 1 capsule (30 mg total) daily by mouth., Disp: 90 capsule, Rfl: 1 .  levalbuterol (XOPENEX) 1.25 MG/3ML nebulizer solution, Take 1.25 mg by nebulization every 4 (four) hours as needed for wheezing., Disp: 72 mL, Rfl: 4 .  levothyroxine (SYNTHROID, LEVOTHROID) 50 MCG tablet, Take 50 mcg by mouth daily before breakfast., Disp: , Rfl:  .  magnesium oxide (MAG-OX) 400 MG tablet, Take 400 mg by mouth at bedtime. Reported on 07/28/2015, Disp: , Rfl:  .  metoprolol tartrate (LOPRESSOR) 25 MG tablet, Take 1 tablet (25 mg total) by mouth 2 (two) times daily., Disp: 180 tablet, Rfl: 1 .  mometasone-formoterol (DULERA) 200-5 MCG/ACT AERO, Inhale 2 puffs into the lungs 2 (two) times daily. Reported on 07/28/2015, Disp: 3 Inhaler, Rfl: 0 .  montelukast (SINGULAIR) 10 MG tablet, Take 1 tablet (10 mg total) by mouth at bedtime., Disp: 90 tablet, Rfl: 1 .  nystatin cream (MYCOSTATIN), APPLY TO CORNERS OF MOUTH 3 TIMES DAILY AFTER EATING, Disp: , Rfl: 0 .  olmesartan-hydrochlorothiazide (BENICAR HCT) 40-12.5 MG tablet, Take 1 tablet by mouth daily., Disp: 90 tablet, Rfl: 1 .  omega-3 acid ethyl esters (LOVAZA)  1 g capsule, TAKE 2 CAPSULES (2 GRAMS)  DAILY, Disp: 180 capsule, Rfl: 3 .  ondansetron (ZOFRAN) 4 MG tablet, Take 1 tablet (4 mg total) by mouth every 8 (eight) hours as needed for nausea or vomiting., Disp: 20 tablet, Rfl: 0 .  polyethylene glycol (MIRALAX / GLYCOLAX) packet, Take 17 g by mouth daily as needed., Disp: , Rfl:  .  polyethylene glycol powder (GLYCOLAX/MIRALAX) powder, 255 grams one bottle for capsule endoscopy prep (Patient not taking: Reported on 03/06/2018), Disp: 255 g, Rfl: 0 .  valACYclovir (VALTREX) 1000 MG tablet, Take 0.5 tablets (500 mg total) by mouth 2 (two) times daily., Disp: 6 tablet, Rfl: 0 .  venlafaxine XR (EFFEXOR-XR) 150 MG 24 hr capsule, TAKE 1 CAPSULE DAILY WITH  BREAKFAST, Disp: 90 capsule, Rfl: 0 .  vitamin B-12 (CYANOCOBALAMIN) 1000 MCG tablet, Take 1,000 mcg by mouth at bedtime., Disp: , Rfl:  .  XIIDRA 5 % SOLN, , Disp: , Rfl:  Allergies  Allergen Reactions  . Morphine Nausea And Vomiting    Ok with nausea medication  . Diazepam Nausea And Vomiting  . Lyrica [Pregabalin] Other (See Comments)    Joint swelling  . Metformin And Related     diarrhea  . Latex Rash  . Rash Away  [Petrolatum-Zinc Oxide] Rash and Swelling  . Salicylates Other (See Comments)    Other reaction(s): Headache  . Tape Rash    Please use paper tape   ROS No other specific complaints in a complete review of systems (except as listed in HPI above). Objective Vitals:   04/16/18 1323  BP: 114/62  Pulse: 75  Resp: 16  Temp: (!) 97.5 F (36.4 C)  TempSrc: Oral  SpO2: 96%  Weight: 212 lb 6.4 oz (96.3 kg)  Height: 5\' 4"  (1.626 m)    Body mass index is 36.46 kg/m. Nursing Note and Vital Signs reviewed. Physical Exam Constitutional:      Appearance: Normal appearance.  HENT:     Head: Normocephalic and atraumatic.     Nose: Nose normal.     Mouth/Throat:     Mouth: Mucous membranes are moist.     Comments: Crusting  Eyes:     Conjunctiva/sclera: Conjunctivae  normal.  Cardiovascular:     Rate and Rhythm: Normal rate and regular rhythm.     Pulses: Normal pulses.     Heart sounds: Normal heart sounds.  Pulmonary:     Effort: Pulmonary effort is normal. No tachypnea.     Breath sounds: Normal breath sounds.  Genitourinary:    Comments: Patient declined examination Musculoskeletal: Normal range of motion.  Skin:    General: Skin is warm and dry.     Findings: No erythema.  Neurological:     General: No focal deficit present.     Mental Status: She is alert and oriented to person, place, and time.  Psychiatric:        Attention and Perception: Attention and perception normal.        Mood and Affect: Mood and affect normal.        Speech: Speech normal.        Behavior: Behavior normal. Behavior is cooperative.        Thought Content: Thought content normal.        Cognition and Memory: Cognition normal.        Judgment: Judgment is not impulsive.   ? Results for orders placed or performed in visit on 04/16/18 (from the past 48 hour(s))  POCT urinalysis dipstick     Status: Abnormal   Collection Time: 04/16/18  1:47 PM  Result Value Ref Range   Color, UA yellow    Clarity, UA clear    Glucose, UA Negative Negative   Bilirubin, UA negative    Ketones, UA negative    Spec Grav, UA 1.010 1.010 - 1.025   Blood, UA negative    pH, UA 7.0 5.0 - 8.0   Protein, UA Negative Negative   Urobilinogen, UA 0.2 0.2 or 1.0 E.U./dL   Nitrite, UA negative    Leukocytes, UA Moderate (2+) (A) Negative   Appearance clear    Odor none    Assessment & Plan 1. Burning with urination - POCT urinalysis dipstick  2. Acute cystitis without hematuria POCT positive for leukocytes. Will culture to determine organism and if need to change antibiotic medication. Provided education about finishing full course of antibiotics and taking with meals to prevent GI symptoms. Encouraged fluids, 64 oz a day.  - cephALEXin (KEFLEX) 500 MG capsule; Take 1 capsule (500  mg total) by mouth 2 (two) times daily.  Dispense: 14 capsule; Refill: 0 - Urine Culture  3. Panic attack Husband recently died due to pancreatic cancer. Take prescribed Vistaril as needed for panic attacks. Encouraged going to grief counseling provided by local post office. It is important to surround yourself with supportive family and friends. If panic attacks occur more frequently or uncontrolled, please follow-up at the clinic. - hydrOXYzine (VISTARIL) 25 MG capsule; Take 1 capsule (25 mg total) by mouth 3 (three) times daily as needed for anxiety.  Dispense: 30 capsule; Refill: 0  4. Herpes virus infection of oral mucosa Take medications as prescribed. If this keep frequently occurring, please follow-up for medication adjustment or referral to ENT. - valACYclovir (VALTREX) 1000 MG tablet; Take 0.5 tablets (500 mg total) by mouth 2 (two) times daily.  Dispense: 6 tablet; Refill: 0  5. Bipolar 1 disorder, mixed ([place]) Continue depakote and effexor- depressive phase due to loss of spouse- discussed counseling, plans to join grief share group.

## 2018-04-16 NOTE — Patient Instructions (Signed)
Please take antibiotics twice a day with meals for the entire course. Will receive phone call about culture results and if needed to change antibiotics. Please call urology if you have another UTI.   Please take vistaril 25mg  if needed for acute panic- can take 1/2 dose if it causes you to be too drowsy. Can take 2 pills if needed and it does not make you too drowsy. Please take advantage of the post office grief share classes or counseling. Please let me know if there is anything else we can do.   Please take valtrex 500mg  twice a day for the next three days.   General information below: ?Anxiety is normal human sensation. It is what helped our ancestors survive the pitfalls of the wilderness. Anxiety is defined as experiencing worry or nervousness about an imminent event or something with an uncertain outcome. It is a feeling experienced by most people at some point in their lives. Anxiety can be triggered by a very personal issue, such as the illness of a loved one, or an event of global proportions, such as a refugee crisis. Some of the symptoms of anxiety are:  Feeling restless.  Having a feeling of impending danger.  Increased heart rate.  Rapid breathing. Sweating.  Shaking.  Weakness or feeling tired.  Difficulty concentrating on anything except the current worry.  Insomnia.  Stomach or bowel problems. What can we do about anxiety we may be feeling? There are many techniques to help manage stress and relax. Here are 12 ways you can reduce your anxiety almost immediately: 1. Turn off the constant feed of information. Take a social media sabbatical. Studies have shown that social media directly contributes to social anxiety.  2. Monitor your television viewing habits. Are you watching shows that are also contributing to your anxiety, such as 24-hour news stations? Try watching something else, or better yet, nothing at all. Instead, listen to music, read an inspirational book or practice a  hobby. 3. Eat nutritious meals. Also, don't skip meals and keep healthful snacks on hand. Hunger and poor diet contributes to feeling anxious. 4. Sleep. Sleeping on a regular schedule for at least seven to eight hours a night will do wonders for your outlook when you are awake. 5. Exercise. Regular exercise will help rid your body of that anxious energy and help you get more restful sleep. 6. Try deep (diaphragmatic) breathing. Inhale slowly through your nose for five seconds and exhale through your mouth. 7. Practice acceptance and gratitude. When anxiety hits, accept that there are things out of your control that shouldn't be of immediate concern.  8. Seek out humor. When anxiety strikes, watch a funny video, read jokes or call a friend who makes you laugh. Laughter is healing for our bodies and releases endorphins that are calming. 9. Stay positive. Take the effort to replace negative thoughts with positive ones. Try to see a stressful situation in a positive light. Try to come up with solutions rather than dwelling on the problem. 10. Figure out what triggers your anxiety. Keep a journal and make note of anxious moments and the events surrounding them. This will help you identify triggers you can avoid or even eliminate. 11. Talk to someone. Let a trusted friend, family member or even trained professional know that you are feeling overwhelmed and anxious. Verbalize what you are feeling and why.  12. Volunteer. If your anxiety is triggered by a crisis on a large scale, become an advocate and work to  resolve the problem that is causing you unease. Anxiety is often unwelcome and can become overwhelming. If not kept in check, it can become a disorder that could require medical treatment. However, if you take the time to care for yourself and avoid the triggers that make you anxious, you will be able to find moments of relaxation and clarity that make your life much more enjoyable.

## 2018-04-17 DIAGNOSIS — E039 Hypothyroidism, unspecified: Secondary | ICD-10-CM | POA: Diagnosis not present

## 2018-04-17 LAB — URINE CULTURE
MICRO NUMBER:: 210638
SPECIMEN QUALITY:: ADEQUATE

## 2018-04-22 ENCOUNTER — Other Ambulatory Visit: Payer: Self-pay | Admitting: Family Medicine

## 2018-04-22 DIAGNOSIS — J45901 Unspecified asthma with (acute) exacerbation: Principal | ICD-10-CM

## 2018-04-22 DIAGNOSIS — J441 Chronic obstructive pulmonary disease with (acute) exacerbation: Secondary | ICD-10-CM

## 2018-04-30 DIAGNOSIS — Z86010 Personal history of colonic polyps: Secondary | ICD-10-CM | POA: Diagnosis not present

## 2018-04-30 DIAGNOSIS — K219 Gastro-esophageal reflux disease without esophagitis: Secondary | ICD-10-CM | POA: Diagnosis not present

## 2018-05-03 ENCOUNTER — Other Ambulatory Visit: Payer: Self-pay | Admitting: Family Medicine

## 2018-05-03 DIAGNOSIS — E785 Hyperlipidemia, unspecified: Secondary | ICD-10-CM

## 2018-05-03 NOTE — Telephone Encounter (Signed)
Refill Request for Cholesterol medication. Lipitor 40 mg  Last physical: 08/08/2017  Lab Results  Component Value Date   CHOL 189 12/26/2017   HDL 51 12/26/2017   LDLCALC 88 12/26/2017   TRIG 378 (H) 12/26/2017   CHOLHDL 3.7 12/26/2017   Follow up: 06/26/2018

## 2018-05-20 ENCOUNTER — Other Ambulatory Visit: Payer: Self-pay | Admitting: Rheumatology

## 2018-05-20 DIAGNOSIS — M797 Fibromyalgia: Secondary | ICD-10-CM

## 2018-05-20 NOTE — Telephone Encounter (Signed)
Last Visit: 03/06/2018 Next Visit: 09/03/2018   Okay to refill per Dr. Deveshwar 

## 2018-05-23 ENCOUNTER — Ambulatory Visit: Payer: Medicare Other | Admitting: Rheumatology

## 2018-05-29 ENCOUNTER — Telehealth (INDEPENDENT_AMBULATORY_CARE_PROVIDER_SITE_OTHER): Payer: Self-pay | Admitting: Radiology

## 2018-05-29 NOTE — Telephone Encounter (Signed)
Answered no to all screening questions. 

## 2018-05-29 NOTE — Telephone Encounter (Signed)
Left message for patient to call back. Please ask screening questions, thanks!  Do you have now or have you had in the past 7 days a fever and/or chills? Do you have now or have you had in the past 7 days a cough? Do you have now or have you had in the last 7 days nausea, vomiting or abdominal pain? Have you been exposed to anyone who has tested positive for COVID-19? Have you or anyone who lives with you traveled within the last month?

## 2018-05-30 ENCOUNTER — Other Ambulatory Visit: Payer: Self-pay

## 2018-05-30 ENCOUNTER — Ambulatory Visit (INDEPENDENT_AMBULATORY_CARE_PROVIDER_SITE_OTHER): Payer: Medicare Other | Admitting: Orthopedic Surgery

## 2018-05-30 ENCOUNTER — Encounter (INDEPENDENT_AMBULATORY_CARE_PROVIDER_SITE_OTHER): Payer: Self-pay | Admitting: Orthopedic Surgery

## 2018-05-30 DIAGNOSIS — M7541 Impingement syndrome of right shoulder: Secondary | ICD-10-CM

## 2018-05-30 DIAGNOSIS — M7542 Impingement syndrome of left shoulder: Secondary | ICD-10-CM

## 2018-05-30 MED ORDER — BUPIVACAINE HCL 0.5 % IJ SOLN
9.0000 mL | INTRAMUSCULAR | Status: AC | PRN
  Administered 2018-05-30: 9 mL via INTRA_ARTICULAR

## 2018-05-30 MED ORDER — METHYLPREDNISOLONE ACETATE 40 MG/ML IJ SUSP
40.0000 mg | INTRAMUSCULAR | Status: AC | PRN
  Administered 2018-05-30: 40 mg via INTRA_ARTICULAR

## 2018-05-30 MED ORDER — LIDOCAINE HCL 1 % IJ SOLN
5.0000 mL | INTRAMUSCULAR | Status: AC | PRN
  Administered 2018-05-30: 5 mL

## 2018-05-30 NOTE — Progress Notes (Signed)
Office Visit Note   Patient: Jacqueline Lucas           Date of Birth: 08-01-1950           MRN: [id number] Visit Date: 05/30/2018 Requested by: Jacqueline Lucas, Jacqueline Lucas, [place] [id number] PCP: Jacqueline Sizer, MD  Subjective: Chief Complaint  Patient presents with  . Right Shoulder - Pain  . Left Shoulder - Pain    HPI: Jacqueline Lucas is a patient with bilateral shoulder pain.  Has known history of impingement and rotator cuff pathology.  In general she is managing reasonably well.  Injections in the shoulder previously were a year ago last year.  Since have last seen her her husband has passed away from pancreatic cancer.  She is taking over-the-counter medication for the problem.  MRI scan on the left forearm for lipoma was negative for any significant pathology.              ROS: All systems reviewed are negative as they relate to the chief complaint within the history of present illness.  Patient denies  fevers or chills.   Assessment & Plan: Visit Diagnoses:  1. Impingement syndrome of left shoulder   2. Impingement syndrome of right shoulder     Plan: Impression is bilateral shoulder impingement syndrome and rotator cuff tearing.  She is pretty functional with both shoulders.  We will inject both shoulders today subacromial space and see how she does with that.  Follow-up with me as needed.  She is had a lot going on recently and I do not think now is a great time for any type of surgical consideration.  Follow-Up Instructions: Return if symptoms worsen or fail to improve.   Orders:  No orders of the defined types were placed in this encounter.  No orders of the defined types were placed in this encounter.     Procedures: Large Joint Inj: bilateral subacromial bursa on 05/30/2018 10:27 AM Indications: diagnostic evaluation and pain Details: 18 G 1.5 in needle, posterior approach  Arthrogram: No  Medications (Right): 5 mL lidocaine 1 %; 9 mL  bupivacaine 0.5 %; 40 mg methylPREDNISolone acetate 40 MG/ML Medications (Left): 5 mL lidocaine 1 %; 9 mL bupivacaine 0.5 %; 40 mg methylPREDNISolone acetate 40 MG/ML Outcome: tolerated well, no immediate complications Procedure, treatment alternatives, risks and benefits explained, specific risks discussed. Consent was given by the patient. Immediately prior to procedure a time out was called to verify the correct patient, procedure, equipment, support staff and site/side marked as required. Patient was prepped and draped in the usual sterile fashion.       Clinical Data: No additional findings.  Objective: Vital Signs: There were no vitals taken for this visit.  Physical Exam:   Constitutional: Patient appears well-developed HEENT:  Head: Normocephalic Eyes:EOM are normal Neck: Normal range of motion Cardiovascular: Normal rate Pulmonary/chest: Effort normal Neurologic: Patient is alert Skin: Skin is warm Psychiatric: Patient has normal mood and affect    Ortho Exam: Ortho exam demonstrates good cervical spine range of motion.  5 out of 5 grip EPL FPL interosseous wrist flexion extension bicep triceps and deltoid strength.  She has palpable radial pulses.  She has a little bit of diminished motion on the left shoulder compared to the right but overall she has more than 90 degrees of forward flexion abduction bilaterally.  Cuff strength is actually pretty reasonable bilaterally as well.  No discrete AC joint tenderness is present but  impingement signs are positive bilaterally.  Specialty Comments:  No specialty comments available.  Imaging: No results found.   PMFS History: Patient Active Problem List   Diagnosis Date Noted  . Lumbar stenosis 02/04/2018  . Hx of hysterectomy 08/13/2017  . Pain in left wrist 06/12/2017  . Chronic venous insufficiency 06/09/2017  . Varicose veins of both lower extremities with pain 04/30/2017  . Claudication ([place]) 04/30/2017  .  Arthritis of carpometacarpal [facility]) joint of left thumb 05/12/2016  . Bilateral thumb pain 05/01/2016  . Chronic bronchitis ([place]) 04/14/2016  . Anemia 02/03/2016  . Status post bilateral total hip replacement 01/10/2016  . Sicca ([place]) 01/10/2016  . Age related osteoporosis 01/10/2016  . Hip arthritis 08/16/2015  . Microscopic hematuria 07/30/2015  . Vaginal atrophy 07/30/2015  . GAD (generalized anxiety disorder) 07/08/2015  . Moderate episode of recurrent major depressive disorder ([place]) 07/08/2015  . Migraine with aura and without status migrainosus, not intractable 07/08/2015  . Dyslipidemia 07/08/2015  . Degenerative arthritis of right knee 03/16/2015  . Atrophic vaginitis 01/31/2015  . Recurrent UTI 12/31/2014  . IBS (irritable bowel syndrome) 10/29/2014  . Asthma, moderate persistent 09/08/2014  . Allergic state 09/07/2014  . Colon polyp 09/07/2014  . Hemorrhoid 09/07/2014  . Constrictive tenosynovitis 03/12/2014  . H/O total knee replacement, bilateral 07/31/2013  . CAD (coronary artery disease) 11/28/2012  . Anxiety 11/28/2012  . Acid reflux 11/28/2012  . Cardiac murmur 11/28/2012  . BP (high blood pressure) 11/28/2012  . Cannot sleep 11/28/2012  . Adiposity 11/28/2012  . Restless leg 11/28/2012  . Supraventricular tachycardia ([place]) 11/28/2012  . Fibromyalgia 11/28/2012  . Tachycardia 11/28/2012   Past Medical History:  Diagnosis Date  . Allergy   . Allergy to adhesive tape    Allergy to paper tape as well  . Alzheimer disease ([place])   . Anemia   . Anginal pain ([place])   . Anxiety   . Arthritis   . Asthma   . Bronchitis   . Cataract   . Chronic nausea   . Claustrophobia   . DDD (degenerative disc disease), lumbar   . Depression   . Dry eyes    uses Restasis  . Fibromyalgia   . GERD (gastroesophageal reflux disease)   . H/O bladder infections   . Heart murmur   . Hematuria   . Hyperlipidemia   . Hypertension   . Hypothyroidism   . IBS (irritable bowel  syndrome)   . Insomnia   . Lumbar neuritis   . Migraines   . Obesity   . Osteoporosis   . Palpitation   . PONV (postoperative nausea and vomiting)   . RLS (restless legs syndrome)   . Shoulder fracture, right   . Sicca ([place])   . Tachycardia   . Thyroid disease     Family History  Problem Relation Age of Onset  . Diabetes Mother   . Hypertension Mother   . Heart disease Mother   . Alzheimer's disease Mother   . Cancer Father   . Diabetes Father   . Hypertension Sister   . Depression Sister   . Fibromyalgia Sister   . Cancer Brother   . Heart disease Brother   . Stroke Son   . Fibromyalgia Daughter   . Bladder Cancer Neg Hx   . Kidney disease Neg Hx   . Kidney cancer Neg Hx     Past Surgical History:  Procedure Laterality Date  . ABDOMINAL HYSTERECTOMY     due  to cancer of ovaries  . BACK SURGERY N/A 2014   x 5  . BACK SURGERY  12/2017  . CARDIAC CATHETERIZATION     no PCI; 12/18/12 ([facility], Dr. Marcello Moores): EP study with ablation. No coronary angiography done then.  . COLONOSCOPY W/ POLYPECTOMY    . COLONOSCOPY WITH PROPOFOL N/A 02/04/2016   Procedure: COLONOSCOPY WITH PROPOFOL;  Surgeon: Robert Bellow, MD;  Location: [facility] ENDOSCOPY;  Service: Endoscopy;  Laterality: N/A;  . ESOPHAGOGASTRODUODENOSCOPY (EGD) WITH PROPOFOL N/A 02/04/2016   Procedure: ESOPHAGOGASTRODUODENOSCOPY (EGD) WITH PROPOFOL;  Surgeon: Robert Bellow, MD;  Location: [facility] ENDOSCOPY;  Service: Endoscopy;  Laterality: N/A;  . FINGER ARTHROSCOPY WITH CARPOMETACARPEL ([place]) ARTHROPLASTY Left 05/01/2016   Dr. Erlinda Hong  . GIVENS CAPSULE STUDY N/A 05/02/2016   Procedure: GIVENS CAPSULE STUDY;  Surgeon: Manya Silvas, MD;  Location: [facility] ENDOSCOPY;  Service: Endoscopy;  Laterality: N/A;  . HAND SURGERY    . HEMORRHOID SURGERY    . HIP ARTHROPLASTY    . JOINT REPLACEMENT Bilateral    knee  . KNEE ARTHROPLASTY    . REPLACEMENT TOTAL KNEE Left   . SPINE SURGERY     x 2  . tendonititis     right elbow,  right wrist  . TOTAL HIP ARTHROPLASTY Left 2013  . TOTAL HIP ARTHROPLASTY Right 08/16/2015   Procedure: TOTAL HIP ARTHROPLASTY ANTERIOR APPROACH;  Surgeon: Meredith Pel, MD;  Location: [facility];  Service: Orthopedics;  Laterality: Right;  . TOTAL KNEE ARTHROPLASTY Right 03/16/2015   Procedure: RIGHT TOTAL KNEE ARTHROPLASTY, PCL SACRIFICING;  Surgeon: Meredith Pel, MD;  Location: [facility];  Service: Orthopedics;  Laterality: Right;   Social History   Occupational History  . Occupation: Retired  Tobacco Use  . Smoking status: Never Smoker  . Smokeless tobacco: Never Used  . Tobacco comment: smoking cessation materials not required  Substance and Sexual Activity  . Alcohol use: No    Alcohol/week: 0.0 standard drinks  . Drug use: No  . Sexual activity: Never

## 2018-06-20 ENCOUNTER — Encounter: Payer: Self-pay | Admitting: Family Medicine

## 2018-06-20 ENCOUNTER — Encounter: Payer: Self-pay | Admitting: Nurse Practitioner

## 2018-06-21 ENCOUNTER — Other Ambulatory Visit: Payer: Self-pay | Admitting: Family Medicine

## 2018-06-21 NOTE — Telephone Encounter (Signed)
Refill request for general medication: Venlafaxine 150 mcg  Last office visit: 04/16/2018  Follow-ups on file. 06/26/2018

## 2018-06-21 NOTE — Telephone Encounter (Signed)
Patient has 6 capsules left of her Venlafaxine and has her appointment on 06/26/2018.

## 2018-06-26 ENCOUNTER — Encounter: Payer: Self-pay | Admitting: Family Medicine

## 2018-06-26 ENCOUNTER — Ambulatory Visit (INDEPENDENT_AMBULATORY_CARE_PROVIDER_SITE_OTHER): Payer: Medicare Other | Admitting: Family Medicine

## 2018-06-26 ENCOUNTER — Other Ambulatory Visit: Payer: Self-pay

## 2018-06-26 VITALS — BP 127/66 | HR 67 | Temp 97.1°F

## 2018-06-26 DIAGNOSIS — I471 Supraventricular tachycardia: Secondary | ICD-10-CM

## 2018-06-26 DIAGNOSIS — F3160 Bipolar disorder, current episode mixed, unspecified: Secondary | ICD-10-CM

## 2018-06-26 DIAGNOSIS — F411 Generalized anxiety disorder: Secondary | ICD-10-CM | POA: Diagnosis not present

## 2018-06-26 DIAGNOSIS — J449 Chronic obstructive pulmonary disease, unspecified: Secondary | ICD-10-CM | POA: Diagnosis not present

## 2018-06-26 DIAGNOSIS — F5104 Psychophysiologic insomnia: Secondary | ICD-10-CM | POA: Diagnosis not present

## 2018-06-26 DIAGNOSIS — I209 Angina pectoris, unspecified: Secondary | ICD-10-CM

## 2018-06-26 DIAGNOSIS — G43109 Migraine with aura, not intractable, without status migrainosus: Secondary | ICD-10-CM | POA: Diagnosis not present

## 2018-06-26 DIAGNOSIS — J302 Other seasonal allergic rhinitis: Secondary | ICD-10-CM | POA: Diagnosis not present

## 2018-06-26 DIAGNOSIS — I739 Peripheral vascular disease, unspecified: Secondary | ICD-10-CM

## 2018-06-26 DIAGNOSIS — K5909 Other constipation: Secondary | ICD-10-CM | POA: Diagnosis not present

## 2018-06-26 DIAGNOSIS — E039 Hypothyroidism, unspecified: Secondary | ICD-10-CM | POA: Diagnosis not present

## 2018-06-26 DIAGNOSIS — I10 Essential (primary) hypertension: Secondary | ICD-10-CM

## 2018-06-26 DIAGNOSIS — B370 Candidal stomatitis: Secondary | ICD-10-CM

## 2018-06-26 MED ORDER — ONDANSETRON HCL 4 MG PO TABS: 4.0000 mg | ORAL_TABLET | Freq: Three times a day (TID) | ORAL | 1 refills | Status: DC | PRN

## 2018-06-26 MED ORDER — MONTELUKAST SODIUM 10 MG PO TABS: 10.0000 mg | ORAL_TABLET | Freq: Every day | ORAL | 1 refills | Status: DC

## 2018-06-26 MED ORDER — LUBIPROSTONE 24 MCG PO CAPS: 24.0000 ug | ORAL_CAPSULE | Freq: Two times a day (BID) | ORAL | 0 refills | Status: DC

## 2018-06-26 MED ORDER — NYSTATIN 100000 UNIT/ML MT SUSP: 5.0000 mL | Freq: Four times a day (QID) | OROMUCOSAL | 0 refills | Status: DC

## 2018-06-26 MED ORDER — QUETIAPINE FUMARATE 25 MG PO TABS: 25.0000 mg | ORAL_TABLET | Freq: Every day | ORAL | 0 refills | Status: DC

## 2018-06-26 MED ORDER — OLMESARTAN MEDOXOMIL-HCTZ 40-12.5 MG PO TABS: 1.0000 | ORAL_TABLET | Freq: Every day | ORAL | 1 refills | Status: DC

## 2018-06-26 NOTE — Progress Notes (Signed)
Name: Jacqueline Lucas   MRN: [id number]    DOB: [birth year]   Date:06/26/2018       Progress Note  Subjective  Chief Complaint  Chief Complaint  Patient presents with  . Hypertension  . Asthma  . Back Pain    I connected with  [name]  on 06/26/18 at 11:00 AM EDT by a video enabled telemedicine application and verified that I am speaking with the correct person using two identifiers.  I discussed the limitations of evaluation and management by telemedicine and the availability of in person appointments. The patient expressed understanding and agreed to proceed. Staff also discussed with the patient that there may be a patient responsible charge related to this service. Patient Location: at home Provider Location: [facility]    HPI  HTN/SVT she is takingBenicar - 40-12.5, mteoprolol,and is doing well,no chest pain or palpitation. BP at home borderline today but after rest it was back to normal   Claudication: she states she was improving with water activities however pool closed secondary to COVID-19   Bipolar disorder: she is doing well on Depakote and Effexor, denies mania, but she states continues to have episodes of not sleeping for two or three days in a row, we will try adding seroquel. She states she still taking hydroxyzine prn and seems to help with anxiety. Husband diet in 2022-04-01 but her children have been supportive   Trush: she has notice burning inside her mouth and a white coat, she has a history of thrush   Migraine headaches: used to go to the headache clinic, but states she was taking too many medications. She states episodes not as frequent now . She is taking Gabapentin. She also has episodes of cluster headaches intermittent, a little more frequent with increase in stress, worries about COVID-19 Episodes a couple of times a week   FMS: seeing Dr. Olegario Messier on muscle relaxer and gabapentin. Stable   Dyslipidemia: currently  on lipitor only, off Tricorand on Lovaza, no side effects. Recheck labs yearly  Goiter: having dysphagia, and is contemplating surgery, she was seen by Dr. Ezequiel Ganser the past, but switched to Dr. Gabriel Carina and was given reassurance, no need for biopsy at this time. Recently had a visit wit her   COPD/Asthma:  from second hand smoking, taking Duleraand singulair.She states currently doing well with no wheezing or sob. She denies cough.    Angina/ History of SVT: doing well, on metoprolol, statin, and aspirin, sees Dr. Clayborn Bigness yearly but has not been there in a whileNo recent episodes of chest pain Unchanged   Obesity & Hyperglycemia: shehas hyperglycemia./insuilin resistance,She has some metformin at home but caused diarrhea and she stopped it.last A1C was up at 6.4%, we will recheck next visit   Constipation: seen by GI back in 2017 with same symptoms, colonoscopy was normal, and advised to take Miralax, she was having bowel movements daily but now about twice a week. She states since husband died in 04-01-22 she has not been cooking as often and not eating enough fiber, no blood or mucus in stools, no fever or abdominal pain. We will try amitiza and increase fiber in diet, if no improvement go back to GI   Patient Active Problem List   Diagnosis Date Noted  . Lumbar stenosis 02/04/2018  . Hx of hysterectomy 08/13/2017  . Pain in left wrist 06/12/2017  . Chronic venous insufficiency 06/09/2017  . Varicose veins of both lower extremities with pain 04/30/2017  .  Claudication ([place]) 04/30/2017  . Arthritis of carpometacarpal [facility]) joint of left thumb 05/12/2016  . Bilateral thumb pain 05/01/2016  . Anemia 02/03/2016  . Status post bilateral total hip replacement 01/10/2016  . Sicca ([place]) 01/10/2016  . Age related osteoporosis 01/10/2016  . Hip arthritis 08/16/2015  . Microscopic hematuria 07/30/2015  . Vaginal atrophy 07/30/2015  . GAD (generalized anxiety disorder) 07/08/2015   . Moderate episode of recurrent major depressive disorder ([place]) 07/08/2015  . Migraine with aura and without status migrainosus, not intractable 07/08/2015  . Dyslipidemia 07/08/2015  . Degenerative arthritis of right knee 03/16/2015  . Atrophic vaginitis 01/31/2015  . Recurrent UTI 12/31/2014  . IBS (irritable bowel syndrome) 10/29/2014  . Asthma, moderate persistent 09/08/2014  . Allergic state 09/07/2014  . Colon polyp 09/07/2014  . Hemorrhoid 09/07/2014  . Constrictive tenosynovitis 03/12/2014  . H/O total knee replacement, bilateral 07/31/2013  . CAD (coronary artery disease) 11/28/2012  . Anxiety 11/28/2012  . Acid reflux 11/28/2012  . Cardiac murmur 11/28/2012  . BP (high blood pressure) 11/28/2012  . Cannot sleep 11/28/2012  . Adiposity 11/28/2012  . Restless leg 11/28/2012  . Supraventricular tachycardia ([place]) 11/28/2012  . Fibromyalgia 11/28/2012  . Tachycardia 11/28/2012    Past Surgical History:  Procedure Laterality Date  . ABDOMINAL HYSTERECTOMY     due to cancer of ovaries  . BACK SURGERY N/A 2014   x 5  . BACK SURGERY  12/2017  . CARDIAC CATHETERIZATION     no PCI; 12/18/12 ([facility], Dr. Marcello Moores): EP study with ablation. No coronary angiography done then.  . COLONOSCOPY W/ POLYPECTOMY    . COLONOSCOPY WITH PROPOFOL N/A 02/04/2016   Procedure: COLONOSCOPY WITH PROPOFOL;  Surgeon: Robert Bellow, MD;  Location: [facility] ENDOSCOPY;  Service: Endoscopy;  Laterality: N/A;  . ESOPHAGOGASTRODUODENOSCOPY (EGD) WITH PROPOFOL N/A 02/04/2016   Procedure: ESOPHAGOGASTRODUODENOSCOPY (EGD) WITH PROPOFOL;  Surgeon: Robert Bellow, MD;  Location: [facility] ENDOSCOPY;  Service: Endoscopy;  Laterality: N/A;  . FINGER ARTHROSCOPY WITH CARPOMETACARPEL ([place]) ARTHROPLASTY Left 05/01/2016   Dr. Erlinda Hong  . GIVENS CAPSULE STUDY N/A 05/02/2016   Procedure: GIVENS CAPSULE STUDY;  Surgeon: Manya Silvas, MD;  Location: [facility] ENDOSCOPY;  Service: Endoscopy;  Laterality: N/A;  . HAND SURGERY    .  HEMORRHOID SURGERY    . HIP ARTHROPLASTY    . JOINT REPLACEMENT Bilateral    knee  . KNEE ARTHROPLASTY    . REPLACEMENT TOTAL KNEE Left   . SPINE SURGERY     x 2  . tendonititis     right elbow, right wrist  . TOTAL HIP ARTHROPLASTY Left 2013  . TOTAL HIP ARTHROPLASTY Right 08/16/2015   Procedure: TOTAL HIP ARTHROPLASTY ANTERIOR APPROACH;  Surgeon: Meredith Pel, MD;  Location: [place];  Service: Orthopedics;  Laterality: Right;  . TOTAL KNEE ARTHROPLASTY Right 03/16/2015   Procedure: RIGHT TOTAL KNEE ARTHROPLASTY, PCL SACRIFICING;  Surgeon: Meredith Pel, MD;  Location: [place];  Service: Orthopedics;  Laterality: Right;    Family History  Problem Relation Age of Onset  . Diabetes Mother   . Hypertension Mother   . Heart disease Mother   . Alzheimer's disease Mother   . Cancer Father   . Diabetes Father   . Hypertension Sister   . Depression Sister   . Fibromyalgia Sister   . Cancer Brother   . Heart disease Brother   . Stroke Son   . Fibromyalgia Daughter   . Bladder Cancer Neg Hx   .  Kidney disease Neg Hx   . Kidney cancer Neg Hx     Social History   Socioeconomic History  . Marital status: Widowed    Spouse name: Fritz Pickerel  . Number of children: 4  . Years of education: some college  . Highest education level: 12th grade  Occupational History  . Occupation: Retired  Scientific laboratory technician  . Financial resource strain: Not hard at all  . Food insecurity:    Worry: Never true    Inability: Never true  . Transportation needs:    Medical: No    Non-medical: No  Tobacco Use  . Smoking status: Never Smoker  . Smokeless tobacco: Never Used  . Tobacco comment: smoking cessation materials not required  Substance and Sexual Activity  . Alcohol use: No    Alcohol/week: 0.0 standard drinks  . Drug use: No  . Sexual activity: Never  Lifestyle  . Physical activity:    Days per week: 3 days    Minutes per session: 30 min  . Stress: Not at all  Relationships  . Social  connections:    Talks on phone: More than three times a week    Gets together: Twice a week    Attends religious service: 1 to 4 times per year    Active member of club or organization: No    Attends meetings of clubs or organizations: Never    Relationship status: Married  . Intimate partner violence:    Fear of current or ex partner: No    Emotionally abused: No    Physically abused: No    Forced sexual activity: No  Other Topics Concern  . Not on file  Social History Narrative   Patient has 10 grandchildren, 4 great-granddaughters & 2 great-grandsons.   Widow since 02/2018     Current Outpatient Medications:  .  aspirin 81 MG chewable tablet, Chew 81 mg by mouth daily., Disp: , Rfl:  .  atorvastatin (LIPITOR) 40 MG tablet, TAKE 1 TABLET BY MOUTH EVERYDAY AT BEDTIME, Disp: 90 tablet, Rfl: 0 .  baclofen (LIORESAL) 10 MG tablet, TAKE ONE TABLET AT 7AM AND ONE TABLET AT 2PM AS NEEDED FOR MUSCLE SPASM, Disp: 180 tablet, Rfl: 1 .  Cholecalciferol (D3-1000) 1000 units tablet, Take 1,000 Units by mouth at bedtime. , Disp: , Rfl:  .  diclofenac sodium (VOLTAREN) 1 % GEL, Apply topically., Disp: , Rfl:  .  divalproex (DEPAKOTE) 250 MG DR tablet, TAKE 1 TABLET TWICE A DAY, Disp: 180 tablet, Rfl: 1 .  DULERA 200-5 MCG/ACT AERO, USE 2 INHALATIONS ORALLY   TWICE DAILY, Disp: 39 g, Rfl: 0 .  estradiol (ESTRACE) 0.1 MG/GM vaginal cream, FOR DIRECTIONS ON HOW TO   TAKE THIS MEDICINE, READ   THE ENCLOSED MEDICATION    INFORMATION FORM, Disp: 42.5 g, Rfl: 3 .  gabapentin (NEURONTIN) 300 MG capsule, TAKE 1 CAPSULE EVERY       MORNING, TAKE 1 CAPSULE AT NOON AND TAKE 2 CAPSULES   (600MG ) IN THE EVENING, Disp: 360 capsule, Rfl: 0 .  hydrOXYzine (VISTARIL) 25 MG capsule, Take 1 capsule (25 mg total) by mouth 3 (three) times daily as needed for anxiety., Disp: 30 capsule, Rfl: 0 .  levalbuterol (XOPENEX) 1.25 MG/3ML nebulizer solution, Take 1.25 mg by nebulization every 4 (four) hours as needed for  wheezing., Disp: 72 mL, Rfl: 4 .  levothyroxine (SYNTHROID, LEVOTHROID) 50 MCG tablet, Take 50 mcg by mouth daily before breakfast., Disp: , Rfl:  .  magnesium oxide (MAG-OX) 400 MG tablet, Take 400 mg by mouth at bedtime. Reported on 07/28/2015, Disp: , Rfl:  .  metoprolol tartrate (LOPRESSOR) 25 MG tablet, Take 1 tablet (25 mg total) by mouth 2 (two) times daily., Disp: 180 tablet, Rfl: 1 .  montelukast (SINGULAIR) 10 MG tablet, Take 1 tablet (10 mg total) by mouth at bedtime., Disp: 90 tablet, Rfl: 1 .  omega-3 acid ethyl esters (LOVAZA) 1 g capsule, TAKE 2 CAPSULES (2 GRAMS)  DAILY, Disp: 180 capsule, Rfl: 3 .  ondansetron (ZOFRAN) 4 MG tablet, Take 1 tablet (4 mg total) by mouth every 8 (eight) hours as needed for nausea or vomiting., Disp: 20 tablet, Rfl: 1 .  polyethylene glycol (MIRALAX / GLYCOLAX) packet, Take 17 g by mouth daily as needed., Disp: , Rfl:  .  valACYclovir (VALTREX) 1000 MG tablet, Take 0.5 tablets (500 mg total) by mouth 2 (two) times daily., Disp: 6 tablet, Rfl: 0 .  venlafaxine XR (EFFEXOR-XR) 150 MG 24 hr capsule, TAKE 1 CAPSULE DAILY WITH  BREAKFAST, Disp: 90 capsule, Rfl: 0 .  vitamin B-12 (CYANOCOBALAMIN) 1000 MCG tablet, Take 1,000 mcg by mouth at bedtime., Disp: , Rfl:  .  XIIDRA 5 % SOLN, , Disp: , Rfl:  .  lansoprazole (PREVACID) 30 MG capsule, Take 1 capsule (30 mg total) daily by mouth., Disp: 90 capsule, Rfl: 1 .  lubiprostone (AMITIZA) 24 MCG capsule, Take 1 capsule (24 mcg total) by mouth 2 (two) times daily with a meal., Disp: 60 capsule, Rfl: 0 .  nystatin (MYCOSTATIN) 100000 UNIT/ML suspension, Take 5 mLs (500,000 Units total) by mouth 4 (four) times daily., Disp: 60 mL, Rfl: 0 .  olmesartan-hydrochlorothiazide (BENICAR HCT) 40-12.5 MG tablet, Take 1 tablet by mouth daily., Disp: 90 tablet, Rfl: 1 .  polyethylene glycol powder (GLYCOLAX/MIRALAX) powder, 255 grams one bottle for capsule endoscopy prep (Patient not taking: Reported on 03/06/2018), Disp: 255 g,  Rfl: 0 .  QUEtiapine (SEROQUEL) 25 MG tablet, Take 1 tablet (25 mg total) by mouth at bedtime., Disp: 30 tablet, Rfl: 0 .  RESTASIS 0.05 % ophthalmic emulsion, , Disp: , Rfl:   Allergies  Allergen Reactions  . Morphine Nausea And Vomiting    Ok with nausea medication  . Diazepam Nausea And Vomiting  . Lyrica [Pregabalin] Other (See Comments)    Joint swelling  . Metformin And Related     diarrhea  . Latex Rash  . Rash Away  [Petrolatum-Zinc Oxide] Rash and Swelling  . Salicylates Other (See Comments)    Other reaction(s): Headache  . Tape Rash    Please use paper tape    I personally reviewed active problem list, medication list, allergies, family history with the patient/caregiver today.   ROS  Ten systems reviewed and is negative except as mentioned in HPI   Objective  Virtual encounter, vitals not obtained.  There is no height or weight on file to calculate BMI.  Physical Exam  Awake, alert and oriented   PHQ2/9: Depression screen [facility] 2/9 06/26/2018 04/16/2018 12/26/2017 10/08/2017 09/14/2017  Decreased Interest 1 2 0 0 -  Down, Depressed, Hopeless 1 2 0 0 -  PHQ - 2 Score 2 4 0 0 -  Altered sleeping 3 3 0 - 0  Tired, decreased energy 1 3 0 - 0  Change in appetite 0 3 0 - 0  Feeling bad or failure about yourself  0 0 0 - 0  Trouble concentrating 1 3 0 - 0  Moving  slowly or fidgety/restless 0 1 0 - 0  Suicidal thoughts 0 0 0 - 0  PHQ-9 Score 7 17 0 - -  Difficult doing work/chores Not difficult at all Very difficult Not difficult at all - Not difficult at all  Some recent data might be hidden   PHQ-2/9 Result is positive.    Fall Risk: Fall Risk  04/16/2018 01/21/2018 12/26/2017 10/08/2017 09/14/2017  Falls in the past year? 0 0 No No No  Number falls in past yr: 0 - - - -  Injury with Fall? 0 - - - -  Risk Factor Category  - - - - -  Risk for fall due to : - - - - Impaired balance/gait;Impaired vision  Risk for fall due to: Comment - - - - -  Follow up - -  - - -     Assessment & Plan  1. Asthma with COPD ([place])  Continue medication   2. Bipolar 1 disorder, mixed (HCC)  - QUEtiapine (SEROQUEL) 25 MG tablet; Take 1 tablet (25 mg total) by mouth at bedtime.  Dispense: 30 tablet; Refill: 0  3. Chronic seasonal allergic rhinitis  - montelukast (SINGULAIR) 10 MG tablet; Take 1 tablet (10 mg total) by mouth at bedtime.  Dispense: 90 tablet; Refill: 1  4. Essential hypertension  - olmesartan-hydrochlorothiazide (BENICAR HCT) 40-12.5 MG tablet; Take 1 tablet by mouth daily.  Dispense: 90 tablet; Refill: 1  5. Migraine with aura and without status migrainosus, not intractable  - ondansetron (ZOFRAN) 4 MG tablet; Take 1 tablet (4 mg total) by mouth every 8 (eight) hours as needed for nausea or vomiting.  Dispense: 20 tablet; Refill: 1  6. Claudication ([place])  Stable   7. Supraventricular tachycardia (HCC)  Stable  8. Angina pectoris (HCC)  Stable   9. GAD (generalized anxiety disorder)  Continue prn medication  10. Adult hypothyroidism  Keep follow up Dr. Gabriel Carina   11. Other constipation  - lubiprostone (AMITIZA) 24 MCG capsule; Take 1 capsule (24 mcg total) by mouth 2 (two) times daily with a meal.  Dispense: 60 capsule; Refill: 0  12. Psychophysiological insomnia  - QUEtiapine (SEROQUEL) 25 MG tablet; Take 1 tablet (25 mg total) by mouth at bedtime.  Dispense: 30 tablet; Refill: 0  13. Thrush  - nystatin (MYCOSTATIN) 100000 UNIT/ML suspension; Take 5 mLs (500,000 Units total) by mouth 4 (four) times daily.  Dispense: 60 mL; Refill: 0  I discussed the assessment and treatment plan with the patient. The patient was provided an opportunity to ask questions and all were answered. The patient agreed with the plan and demonstrated an understanding of the instructions.  The patient was advised to call back or seek an in-person evaluation if the symptoms worsen or if the condition fails to improve as anticipated.  I provided 25  minutes of non-face-to-face time during this encounter.

## 2018-07-02 ENCOUNTER — Telehealth: Payer: Self-pay

## 2018-07-02 MED ORDER — LINACLOTIDE 145 MCG PO CAPS: 145.0000 ug | ORAL_CAPSULE | Freq: Every day | ORAL | 1 refills | Status: DC

## 2018-07-02 MED ORDER — LINACLOTIDE 145 MCG PO CAPS: 145.0000 ug | ORAL_CAPSULE | Freq: Every day | ORAL | 2 refills | Status: DC

## 2018-07-02 NOTE — Telephone Encounter (Signed)
Patient states she has been itching like crazy with the divalproex and wants to safely stop taking it

## 2018-07-02 NOTE — Telephone Encounter (Signed)
Copied from [place] [phone]. Topic: General - Other >> Jul 01, 2018  4:38 PM Rainey Pines A wrote: Reason for CRM: Patient is concerned with side effects she is experiencing taking the divalproex (DEPAKOTE) 250 MG DR tablet. Patient would like to speak with Dr. Ancil Boozer nurse.

## 2018-07-02 NOTE — Telephone Encounter (Signed)
Copied from [place] [phone]. Topic: General - Other >> Jul 01, 2018  4:38 PM Rainey Pines A wrote: Reason for CRM: Patient is concerned with side effects she is experiencing taking the divalproex (DEPAKOTE) 250 MG DR tablet. Patient would like to speak with Dr. Ancil Boozer nurse.

## 2018-07-02 NOTE — Telephone Encounter (Signed)
Insurance will not cover Amitiza. The Formulary choices for [employer] are Linzess, Movantik and Lactulose.

## 2018-07-02 NOTE — Telephone Encounter (Signed)
Copied from [place] [phone]. Topic: General - Other >> Jul 01, 2018  4:38 PM Rainey Pines A wrote: Reason for CRM: Patient is concerned with side effects she is experiencing taking the divalproex (DEPAKOTE) 250 MG DR tablet. Patient would like to speak with Dr. Ancil Boozer nurse.

## 2018-07-03 NOTE — Telephone Encounter (Signed)
Patient informed. 

## 2018-07-03 NOTE — Telephone Encounter (Signed)
Pt called back in to provide the name of the medication divalproex potassium 250 MG. Pt says that she would like to stop taking medication but is unsure of how she is suppose to stop  Pt would like a call back

## 2018-07-10 ENCOUNTER — Telehealth: Payer: Self-pay | Admitting: Family Medicine

## 2018-07-10 ENCOUNTER — Other Ambulatory Visit: Payer: Self-pay | Admitting: Family Medicine

## 2018-07-10 DIAGNOSIS — F410 Panic disorder [episodic paroxysmal anxiety] without agoraphobia: Secondary | ICD-10-CM

## 2018-07-10 MED ORDER — VENLAFAXINE HCL ER 37.5 MG PO CP24: 37.5000 mg | ORAL_CAPSULE | Freq: Every day | ORAL | 0 refills | Status: DC

## 2018-07-10 MED ORDER — HYDROXYZINE PAMOATE 25 MG PO CAPS: 25.0000 mg | ORAL_CAPSULE | Freq: Three times a day (TID) | ORAL | 0 refills | Status: DC | PRN

## 2018-07-10 NOTE — Telephone Encounter (Signed)
Copied from [place]. Topic: Quick Communication - Rx Refill/Question >> Jul 10, 2018 10:42 AM Celene Kras A wrote: Medication: hydrOXYzine (VISTARIL) 25 MG capsule  Has the patient contacted their pharmacy? No. (Agent: If no, request that the patient contact the pharmacy for the refill.) (Agent: If yes, when and what did the pharmacy advise?)  Preferred Pharmacy (with phone number or street name): [id number] [place] [address] [place] [address] Phone: [phone] Fax: [phone] Not a 24 hour pharmacy; exact hours not known.    Agent: Please be advised that RX refills may take up to 3 business days. We ask that you follow-up with your pharmacy.

## 2018-07-10 NOTE — Telephone Encounter (Signed)
Patient called.  Patient aware.  

## 2018-07-10 NOTE — Telephone Encounter (Unsigned)
Copied from [place] [phone]. Topic: General - Other >> Jul 10, 2018 10:44 AM Celene Kras A wrote: Reason for CRM: Pt called requesting advice on how to stop taking venlafaxine XR (EFFEXOR-XR) 150 MG 24 hr capsule. Please advise.

## 2018-07-11 ENCOUNTER — Encounter: Payer: Self-pay | Admitting: Family Medicine

## 2018-07-11 ENCOUNTER — Other Ambulatory Visit: Payer: Self-pay

## 2018-07-11 ENCOUNTER — Ambulatory Visit (INDEPENDENT_AMBULATORY_CARE_PROVIDER_SITE_OTHER): Payer: Medicare Other | Admitting: Family Medicine

## 2018-07-11 DIAGNOSIS — R300 Dysuria: Secondary | ICD-10-CM

## 2018-07-11 DIAGNOSIS — N3090 Cystitis, unspecified without hematuria: Secondary | ICD-10-CM | POA: Diagnosis not present

## 2018-07-11 MED ORDER — NITROFURANTOIN MONOHYD MACRO 100 MG PO CAPS: 100.0000 mg | ORAL_CAPSULE | Freq: Two times a day (BID) | ORAL | 0 refills | Status: AC

## 2018-07-11 NOTE — Progress Notes (Signed)
Name: Jacqueline Lucas   MRN: [id number]    DOB: Oct 07, 1950   Date:07/11/2018       Progress Note  Subjective  Chief Complaint  Chief Complaint  Patient presents with  . Urinary Tract Infection    burning, low back pain, took AZO    I connected with  [name]  on 07/11/18 at 11:40 AM EDT by a video enabled telemedicine application and verified that I am speaking with the correct person using two identifiers.  I discussed the limitations of evaluation and management by telemedicine and the availability of in person appointments. The patient expressed understanding and agreed to proceed. Staff also discussed with the patient that there may be a patient responsible charge related to this service. Patient Location: Home Provider Location: Home Additional Individuals present: None  HPI  Pt presents with concern for UTI.  Having dysuria, bilateral low back pain for about 3 days.  No history of kidney stones, no frank hematuria, fevers/chills, abdominal pain.  This is her 3rd episode of UTI symptoms since January.  She was seeing Carlis Stable with urology, last visit was in October 2019, and was told to f/u in 1 year.  She will call today to schedule something sooner.  Pt is still using vaginal estrogen cream 3 nights a week.  Patient Active Problem List   Diagnosis Date Noted  . Lumbar stenosis 02/04/2018  . Hx of hysterectomy 08/13/2017  . Pain in left wrist 06/12/2017  . Chronic venous insufficiency 06/09/2017  . Varicose veins of both lower extremities with pain 04/30/2017  . Claudication ([place]) 04/30/2017  . Arthritis of carpometacarpal [facility]) joint of left thumb 05/12/2016  . Bilateral thumb pain 05/01/2016  . Anemia 02/03/2016  . Status post bilateral total hip replacement 01/10/2016  . Sicca ([place]) 01/10/2016  . Age related osteoporosis 01/10/2016  . Hip arthritis 08/16/2015  . Microscopic hematuria 07/30/2015  . Vaginal atrophy 07/30/2015  . GAD (generalized  anxiety disorder) 07/08/2015  . Moderate episode of recurrent major depressive disorder ([place]) 07/08/2015  . Migraine with aura and without status migrainosus, not intractable 07/08/2015  . Dyslipidemia 07/08/2015  . Degenerative arthritis of right knee 03/16/2015  . Atrophic vaginitis 01/31/2015  . Recurrent UTI 12/31/2014  . IBS (irritable bowel syndrome) 10/29/2014  . Asthma, moderate persistent 09/08/2014  . Allergic state 09/07/2014  . Colon polyp 09/07/2014  . Hemorrhoid 09/07/2014  . Constrictive tenosynovitis 03/12/2014  . H/O total knee replacement, bilateral 07/31/2013  . CAD (coronary artery disease) 11/28/2012  . Anxiety 11/28/2012  . Acid reflux 11/28/2012  . Cardiac murmur 11/28/2012  . BP (high blood pressure) 11/28/2012  . Cannot sleep 11/28/2012  . Adiposity 11/28/2012  . Restless leg 11/28/2012  . Supraventricular tachycardia ([place]) 11/28/2012  . Fibromyalgia 11/28/2012  . Tachycardia 11/28/2012    Social History   Tobacco Use  . Smoking status: Never Smoker  . Smokeless tobacco: Never Used  . Tobacco comment: smoking cessation materials not required  Substance Use Topics  . Alcohol use: No    Alcohol/week: 0.0 standard drinks     Current Outpatient Medications:  .  aspirin 81 MG chewable tablet, Chew 81 mg by mouth daily., Disp: , Rfl:  .  atorvastatin (LIPITOR) 40 MG tablet, TAKE 1 TABLET BY MOUTH EVERYDAY AT BEDTIME, Disp: 90 tablet, Rfl: 0 .  baclofen (LIORESAL) 10 MG tablet, TAKE ONE TABLET AT 7AM AND ONE TABLET AT 2PM AS NEEDED FOR MUSCLE SPASM, Disp: 180 tablet, Rfl: 1 .  Cholecalciferol (D3-1000) 1000 units tablet, Take 1,000 Units by mouth at bedtime. , Disp: , Rfl:  .  diclofenac sodium (VOLTAREN) 1 % GEL, Apply topically., Disp: , Rfl:  .  divalproex (DEPAKOTE) 250 MG DR tablet, TAKE 1 TABLET TWICE A DAY, Disp: 180 tablet, Rfl: 1 .  DULERA 200-5 MCG/ACT AERO, USE 2 INHALATIONS ORALLY   TWICE DAILY, Disp: 39 g, Rfl: 0 .  estradiol (ESTRACE) 0.1  MG/GM vaginal cream, FOR DIRECTIONS ON HOW TO   TAKE THIS MEDICINE, READ   THE ENCLOSED MEDICATION    INFORMATION FORM, Disp: 42.5 g, Rfl: 3 .  gabapentin (NEURONTIN) 300 MG capsule, TAKE 1 CAPSULE EVERY       MORNING, TAKE 1 CAPSULE AT NOON AND TAKE 2 CAPSULES   (600MG ) IN THE EVENING, Disp: 360 capsule, Rfl: 0 .  hydrOXYzine (VISTARIL) 25 MG capsule, Take 1 capsule (25 mg total) by mouth 3 (three) times daily as needed for anxiety., Disp: 30 capsule, Rfl: 0 .  levalbuterol (XOPENEX) 1.25 MG/3ML nebulizer solution, Take 1.25 mg by nebulization every 4 (four) hours as needed for wheezing., Disp: 72 mL, Rfl: 4 .  levothyroxine (SYNTHROID, LEVOTHROID) 50 MCG tablet, Take 50 mcg by mouth daily before breakfast., Disp: , Rfl:  .  linaclotide (LINZESS) 145 MCG CAPS capsule, Take 1 capsule (145 mcg total) by mouth daily before breakfast., Disp: 90 capsule, Rfl: 1 .  magnesium oxide (MAG-OX) 400 MG tablet, Take 400 mg by mouth at bedtime. Reported on 07/28/2015, Disp: , Rfl:  .  metoprolol tartrate (LOPRESSOR) 25 MG tablet, Take 1 tablet (25 mg total) by mouth 2 (two) times daily., Disp: 180 tablet, Rfl: 1 .  montelukast (SINGULAIR) 10 MG tablet, Take 1 tablet (10 mg total) by mouth at bedtime., Disp: 90 tablet, Rfl: 1 .  nystatin (MYCOSTATIN) 100000 UNIT/ML suspension, Take 5 mLs (500,000 Units total) by mouth 4 (four) times daily., Disp: 60 mL, Rfl: 0 .  olmesartan-hydrochlorothiazide (BENICAR HCT) 40-12.5 MG tablet, Take 1 tablet by mouth daily., Disp: 90 tablet, Rfl: 1 .  omega-3 acid ethyl esters (LOVAZA) 1 g capsule, TAKE 2 CAPSULES (2 GRAMS)  DAILY, Disp: 180 capsule, Rfl: 3 .  ondansetron (ZOFRAN) 4 MG tablet, Take 1 tablet (4 mg total) by mouth every 8 (eight) hours as needed for nausea or vomiting., Disp: 20 tablet, Rfl: 1 .  polyethylene glycol (MIRALAX / GLYCOLAX) packet, Take 17 g by mouth daily as needed., Disp: , Rfl:  .  QUEtiapine (SEROQUEL) 25 MG tablet, Take 1 tablet (25 mg total) by mouth  at bedtime., Disp: 30 tablet, Rfl: 0 .  RESTASIS 0.05 % ophthalmic emulsion, , Disp: , Rfl:  .  valACYclovir (VALTREX) 1000 MG tablet, Take 0.5 tablets (500 mg total) by mouth 2 (two) times daily., Disp: 6 tablet, Rfl: 0 .  venlafaxine XR (EFFEXOR-XR) 37.5 MG 24 hr capsule, Take 1-2 capsules (37.5-75 mg total) by mouth daily with breakfast. Take 2 for two weeks after that , one capsule until done, Disp: 60 capsule, Rfl: 0 .  vitamin B-12 (CYANOCOBALAMIN) 1000 MCG tablet, Take 1,000 mcg by mouth at bedtime., Disp: , Rfl:  .  XIIDRA 5 % SOLN, , Disp: , Rfl:  .  lansoprazole (PREVACID) 30 MG capsule, Take 1 capsule (30 mg total) daily by mouth., Disp: 90 capsule, Rfl: 1 .  polyethylene glycol powder (GLYCOLAX/MIRALAX) powder, 255 grams one bottle for capsule endoscopy prep (Patient not taking: Reported on 03/06/2018), Disp: 255 g, Rfl: 0  Allergies  Allergen Reactions  . Morphine Nausea And Vomiting    Ok with nausea medication  . Diazepam Nausea And Vomiting  . Lyrica [Pregabalin] Other (See Comments)    Joint swelling  . Metformin And Related     diarrhea  . Latex Rash  . Rash Away  [Petrolatum-Zinc Oxide] Rash and Swelling  . Salicylates Other (See Comments)    Other reaction(s): Headache  . Tape Rash    Please use paper tape    I personally reviewed active problem list, medication list, allergies, notes from last encounter, lab results with the patient/caregiver today.  ROS  Ten systems reviewed and is negative except as mentioned in HPI  Objective  Virtual encounter, vitals not obtained.  There is no height or weight on file to calculate BMI.  Nursing Note and Vital Signs reviewed.  Physical Exam  Constitutional: Patient appears well-developed and well-nourished. No distress.  HENT: Head: Normocephalic and atraumatic.  Neck: Normal range of motion. Pulmonary/Chest: Effort normal. No respiratory distress. Speaking in complete sentences Neurological: Pt is alert and  oriented to person, place, and time. Coordination, speech are normal.  Psychiatric: Patient has a normal mood and affect. behavior is normal. Judgment and thought content normal.  No results found for this or any previous visit (from the past 72 hour(s)).  Assessment & Plan  1. Dysuria - Urine Culture - will come in to provide sample - nitrofurantoin, macrocrystal-monohydrate, (MACROBID) 100 MG capsule; Take 1 capsule (100 mg total) by mouth 2 (two) times daily for 5 days.  Dispense: 10 capsule; Refill: 0  2. Recurrent cystitis - Urine Culture - nitrofurantoin, macrocrystal-monohydrate, (MACROBID) 100 MG capsule; Take 1 capsule (100 mg total) by mouth 2 (two) times daily for 5 days.  Dispense: 10 capsule; Refill: 0 - Will reach out to Urology to set up f/u appt  -Red flags and when to present for emergency care or RTC including fever >101.42F, chest pain, shortness of breath, new/worsening/un-resolving symptoms, reviewed with patient at time of visit. Follow up and care instructions discussed and provided in AVS. - I discussed the assessment and treatment plan with the patient. The patient was provided an opportunity to ask questions and all were answered. The patient agreed with the plan and demonstrated an understanding of the instructions.  I provided 16 minutes of non-face-to-face time during this encounter.  Hubbard Hartshorn, FNP

## 2018-07-12 LAB — URINE CULTURE
MICRO NUMBER:: 474973
Result:: NO GROWTH
SPECIMEN QUALITY:: ADEQUATE

## 2018-07-19 ENCOUNTER — Other Ambulatory Visit: Payer: Self-pay | Admitting: Family Medicine

## 2018-07-19 DIAGNOSIS — F5104 Psychophysiologic insomnia: Secondary | ICD-10-CM

## 2018-07-19 DIAGNOSIS — F3160 Bipolar disorder, current episode mixed, unspecified: Secondary | ICD-10-CM

## 2018-07-26 ENCOUNTER — Encounter: Payer: Self-pay | Admitting: Family Medicine

## 2018-07-26 ENCOUNTER — Other Ambulatory Visit: Payer: Self-pay

## 2018-07-26 ENCOUNTER — Ambulatory Visit (INDEPENDENT_AMBULATORY_CARE_PROVIDER_SITE_OTHER): Payer: Medicare Other | Admitting: Family Medicine

## 2018-07-26 DIAGNOSIS — F330 Major depressive disorder, recurrent, mild: Secondary | ICD-10-CM | POA: Diagnosis not present

## 2018-07-26 DIAGNOSIS — I209 Angina pectoris, unspecified: Secondary | ICD-10-CM

## 2018-07-26 DIAGNOSIS — K5909 Other constipation: Secondary | ICD-10-CM | POA: Diagnosis not present

## 2018-07-26 NOTE — Progress Notes (Signed)
Name: Jacqueline Lucas   MRN: [id number]    DOB: 17-Jun-1950   Date:07/26/2018       Progress Note  Subjective  Chief Complaint  Chief Complaint  Patient presents with  . Follow-up  . Insomnia  . Constipation    I connected with  [name]  on 07/26/18 at  2:00 PM EDT by a video enabled telemedicine application and verified that I am speaking with the correct person using two identifiers.  I discussed the limitations of evaluation and management by telemedicine and the availability of in person appointments. The patient expressed understanding and agreed to proceed. Staff also discussed with the patient that there may be a patient responsible charge related to this service. Patient Location: at home  Provider Location: [facility]   HPI  Constipation: seen by GI back in 2017 with same symptoms, colonoscopy was normal, and advised to take Miralax, she was having bowel movements daily but now about twice a week. She states since husband died in 04/14/2022 she was  not cooking as often and not eating enough fiber, no blood or mucus in stools, no fever or abdominal pain. Since last visit she has been eating more fruit and vegetables and Lizess  and is having bowel movements every other day, still has mild straining but great improvement with medication.   Bipolar disorder: she is doing well on Depakote and Effexor, denies mania,but she states continues to have episodes of not sleeping for two or three days in a row, she was given seroquel for one night but her daughter daughter asked her to try life style modification. She states she has been eating earlier between 5-5:30 pm, taking her shower at night instead of in am's, going to bed between 9-9:30 pm and is sleeping better and waking up around 6-7 am. She is feeling much better.    Patient Active Problem List   Diagnosis Date Noted  . Lumbar stenosis 02/04/2018  . Hx of hysterectomy 08/13/2017  . Pain in left  wrist 06/12/2017  . Chronic venous insufficiency 06/09/2017  . Varicose veins of both lower extremities with pain 04/30/2017  . Claudication ([place]) 04/30/2017  . Arthritis of carpometacarpal [facility]) joint of left thumb 05/12/2016  . Bilateral thumb pain 05/01/2016  . Anemia 02/03/2016  . Status post bilateral total hip replacement 01/10/2016  . Sicca ([place]) 01/10/2016  . Age related osteoporosis 01/10/2016  . Hip arthritis 08/16/2015  . Microscopic hematuria 07/30/2015  . Vaginal atrophy 07/30/2015  . GAD (generalized anxiety disorder) 07/08/2015  . Moderate episode of recurrent major depressive disorder ([place]) 07/08/2015  . Migraine with aura and without status migrainosus, not intractable 07/08/2015  . Dyslipidemia 07/08/2015  . Degenerative arthritis of right knee 03/16/2015  . Atrophic vaginitis 01/31/2015  . Recurrent UTI 12/31/2014  . IBS (irritable bowel syndrome) 10/29/2014  . Asthma, moderate persistent 09/08/2014  . Allergic state 09/07/2014  . Colon polyp 09/07/2014  . Hemorrhoid 09/07/2014  . Constrictive tenosynovitis 03/12/2014  . H/O total knee replacement, bilateral 07/31/2013  . CAD (coronary artery disease) 11/28/2012  . Anxiety 11/28/2012  . Acid reflux 11/28/2012  . Cardiac murmur 11/28/2012  . BP (high blood pressure) 11/28/2012  . Cannot sleep 11/28/2012  . Adiposity 11/28/2012  . Restless leg 11/28/2012  . Supraventricular tachycardia ([place]) 11/28/2012  . Fibromyalgia 11/28/2012  . Tachycardia 11/28/2012    Past Surgical History:  Procedure Laterality Date  . ABDOMINAL HYSTERECTOMY     due to cancer of  ovaries  . BACK SURGERY N/A 2014   x 5  . BACK SURGERY  12/2017  . CARDIAC CATHETERIZATION     no PCI; 12/18/12 ([facility], Dr. Marcello Moores): EP study with ablation. No coronary angiography done then.  . COLONOSCOPY W/ POLYPECTOMY    . COLONOSCOPY WITH PROPOFOL N/A 02/04/2016   Procedure: COLONOSCOPY WITH PROPOFOL;  Surgeon: Robert Bellow, MD;  Location:  [facility] ENDOSCOPY;  Service: Endoscopy;  Laterality: N/A;  . ESOPHAGOGASTRODUODENOSCOPY (EGD) WITH PROPOFOL N/A 02/04/2016   Procedure: ESOPHAGOGASTRODUODENOSCOPY (EGD) WITH PROPOFOL;  Surgeon: Robert Bellow, MD;  Location: [facility] ENDOSCOPY;  Service: Endoscopy;  Laterality: N/A;  . FINGER ARTHROSCOPY WITH CARPOMETACARPEL ([place]) ARTHROPLASTY Left 05/01/2016   Dr. Erlinda Hong  . GIVENS CAPSULE STUDY N/A 05/02/2016   Procedure: GIVENS CAPSULE STUDY;  Surgeon: Manya Silvas, MD;  Location: [facility] ENDOSCOPY;  Service: Endoscopy;  Laterality: N/A;  . HAND SURGERY    . HEMORRHOID SURGERY    . HIP ARTHROPLASTY    . JOINT REPLACEMENT Bilateral    knee  . KNEE ARTHROPLASTY    . REPLACEMENT TOTAL KNEE Left   . SPINE SURGERY     x 2  . tendonititis     right elbow, right wrist  . TOTAL HIP ARTHROPLASTY Left 2013  . TOTAL HIP ARTHROPLASTY Right 08/16/2015   Procedure: TOTAL HIP ARTHROPLASTY ANTERIOR APPROACH;  Surgeon: Meredith Pel, MD;  Location: [place];  Service: Orthopedics;  Laterality: Right;  . TOTAL KNEE ARTHROPLASTY Right 03/16/2015   Procedure: RIGHT TOTAL KNEE ARTHROPLASTY, PCL SACRIFICING;  Surgeon: Meredith Pel, MD;  Location: [place];  Service: Orthopedics;  Laterality: Right;    Family History  Problem Relation Age of Onset  . Diabetes Mother   . Hypertension Mother   . Heart disease Mother   . Alzheimer's disease Mother   . Cancer Father   . Diabetes Father   . Hypertension Sister   . Depression Sister   . Fibromyalgia Sister   . Cancer Brother   . Heart disease Brother   . Stroke Son   . Fibromyalgia Daughter   . Bladder Cancer Neg Hx   . Kidney disease Neg Hx   . Kidney cancer Neg Hx     Social History   Socioeconomic History  . Marital status: Widowed    Spouse name: Fritz Pickerel  . Number of children: 4  . Years of education: some college  . Highest education level: 12th grade  Occupational History  . Occupation: Retired  Scientific laboratory technician  . Financial resource strain:  Not hard at all  . Food insecurity:    Worry: Never true    Inability: Never true  . Transportation needs:    Medical: No    Non-medical: No  Tobacco Use  . Smoking status: Never Smoker  . Smokeless tobacco: Never Used  . Tobacco comment: smoking cessation materials not required  Substance and Sexual Activity  . Alcohol use: No    Alcohol/week: 0.0 standard drinks  . Drug use: No  . Sexual activity: Never  Lifestyle  . Physical activity:    Days per week: 3 days    Minutes per session: 30 min  . Stress: Not at all  Relationships  . Social connections:    Talks on phone: More than three times a week    Gets together: Twice a week    Attends religious service: 1 to 4 times per year    Active member of club or organization: No  Attends meetings of clubs or organizations: Never    Relationship status: Married  . Intimate partner violence:    Fear of current or ex partner: No    Emotionally abused: No    Physically abused: No    Forced sexual activity: No  Other Topics Concern  . Not on file  Social History Narrative   Patient has 10 grandchildren, 4 great-granddaughters & 2 great-grandsons.   Widow since 02/2018     Current Outpatient Medications:  .  aspirin 81 MG chewable tablet, Chew 81 mg by mouth daily., Disp: , Rfl:  .  atorvastatin (LIPITOR) 40 MG tablet, TAKE 1 TABLET BY MOUTH EVERYDAY AT BEDTIME, Disp: 90 tablet, Rfl: 0 .  Cholecalciferol (D3-1000) 1000 units tablet, Take 1,000 Units by mouth at bedtime. , Disp: , Rfl:  .  diclofenac sodium (VOLTAREN) 1 % GEL, Apply topically., Disp: , Rfl:  .  divalproex (DEPAKOTE) 250 MG DR tablet, TAKE 1 TABLET TWICE A DAY, Disp: 180 tablet, Rfl: 1 .  DULERA 200-5 MCG/ACT AERO, USE 2 INHALATIONS ORALLY   TWICE DAILY, Disp: 39 g, Rfl: 0 .  estradiol (ESTRACE) 0.1 MG/GM vaginal cream, FOR DIRECTIONS ON HOW TO   TAKE THIS MEDICINE, READ   THE ENCLOSED MEDICATION    INFORMATION FORM, Disp: 42.5 g, Rfl: 3 .  gabapentin  (NEURONTIN) 300 MG capsule, TAKE 1 CAPSULE EVERY       MORNING, TAKE 1 CAPSULE AT NOON AND TAKE 2 CAPSULES   (600MG ) IN THE EVENING, Disp: 360 capsule, Rfl: 0 .  hydrOXYzine (VISTARIL) 25 MG capsule, Take 1 capsule (25 mg total) by mouth 3 (three) times daily as needed for anxiety., Disp: 30 capsule, Rfl: 0 .  levalbuterol (XOPENEX) 1.25 MG/3ML nebulizer solution, Take 1.25 mg by nebulization every 4 (four) hours as needed for wheezing., Disp: 72 mL, Rfl: 4 .  levothyroxine (SYNTHROID, LEVOTHROID) 50 MCG tablet, Take 50 mcg by mouth daily before breakfast., Disp: , Rfl:  .  linaclotide (LINZESS) 145 MCG CAPS capsule, Take 1 capsule (145 mcg total) by mouth daily before breakfast., Disp: 90 capsule, Rfl: 1 .  magnesium oxide (MAG-OX) 400 MG tablet, Take 400 mg by mouth at bedtime. Reported on 07/28/2015, Disp: , Rfl:  .  metoprolol tartrate (LOPRESSOR) 25 MG tablet, Take 1 tablet (25 mg total) by mouth 2 (two) times daily., Disp: 180 tablet, Rfl: 1 .  montelukast (SINGULAIR) 10 MG tablet, Take 1 tablet (10 mg total) by mouth at bedtime., Disp: 90 tablet, Rfl: 1 .  nystatin (MYCOSTATIN) 100000 UNIT/ML suspension, Take 5 mLs (500,000 Units total) by mouth 4 (four) times daily., Disp: 60 mL, Rfl: 0 .  olmesartan-hydrochlorothiazide (BENICAR HCT) 40-12.5 MG tablet, Take 1 tablet by mouth daily., Disp: 90 tablet, Rfl: 1 .  omega-3 acid ethyl esters (LOVAZA) 1 g capsule, TAKE 2 CAPSULES (2 GRAMS)  DAILY, Disp: 180 capsule, Rfl: 3 .  ondansetron (ZOFRAN) 4 MG tablet, Take 1 tablet (4 mg total) by mouth every 8 (eight) hours as needed for nausea or vomiting., Disp: 20 tablet, Rfl: 1 .  polyethylene glycol (MIRALAX / GLYCOLAX) packet, Take 17 g by mouth daily as needed., Disp: , Rfl:  .  polyethylene glycol powder (GLYCOLAX/MIRALAX) powder, 255 grams one bottle for capsule endoscopy prep, Disp: 255 g, Rfl: 0 .  RESTASIS 0.05 % ophthalmic emulsion, , Disp: , Rfl:  .  valACYclovir (VALTREX) 1000 MG tablet, Take  0.5 tablets (500 mg total) by mouth 2 (two) times daily., Disp:  6 tablet, Rfl: 0 .  venlafaxine XR (EFFEXOR-XR) 37.5 MG 24 hr capsule, Take 1-2 capsules (37.5-75 mg total) by mouth daily with breakfast. Take 2 for two weeks after that , one capsule until done, Disp: 60 capsule, Rfl: 0 .  vitamin B-12 (CYANOCOBALAMIN) 1000 MCG tablet, Take 1,000 mcg by mouth at bedtime., Disp: , Rfl:  .  XIIDRA 5 % SOLN, , Disp: , Rfl:  .  baclofen (LIORESAL) 10 MG tablet, TAKE ONE TABLET AT 7AM AND ONE TABLET AT 2PM AS NEEDED FOR MUSCLE SPASM (Patient not taking: Reported on 07/26/2018), Disp: 180 tablet, Rfl: 1 .  lansoprazole (PREVACID) 30 MG capsule, Take 1 capsule (30 mg total) daily by mouth., Disp: 90 capsule, Rfl: 1 .  QUEtiapine (SEROQUEL) 25 MG tablet, TAKE 1 TABLET BY MOUTH EVERYDAY AT BEDTIME (Patient not taking: Reported on 07/26/2018), Disp: 90 tablet, Rfl: 0  Allergies  Allergen Reactions  . Morphine Nausea And Vomiting    Ok with nausea medication  . Diazepam Nausea And Vomiting  . Lyrica [Pregabalin] Other (See Comments)    Joint swelling  . Metformin And Related     diarrhea  . Latex Rash  . Rash Away  [Petrolatum-Zinc Oxide] Rash and Swelling  . Salicylates Other (See Comments)    Other reaction(s): Headache  . Tape Rash    Please use paper tape    I personally reviewed active problem list, medication list, allergies, family history, social history with the patient/caregiver today.   ROS  Ten systems reviewed and is negative except as mentioned in HPI   Objective  Virtual encounter, vitals not obtained.  There is no height or weight on file to calculate BMI.     Physical Exam  Awake, alert and oriented, in good spirits   PHQ2/9: Depression screen [facility] 2/9 07/26/2018 07/11/2018 06/26/2018 04/16/2018 12/26/2017  Decreased Interest 1 1 1 2  0  Down, Depressed, Hopeless 1 1 1 2  0  PHQ - 2 Score 2 2 2 4  0  Altered sleeping 1 1 3 3  0  Tired, decreased energy 1 1 1 3  0  Change in  appetite 0 1 0 3 0  Feeling bad or failure about yourself  0 1 0 0 0  Trouble concentrating 0 1 1 3  0  Moving slowly or fidgety/restless 0 1 0 1 0  Suicidal thoughts 0 1 0 0 0  PHQ-9 Score 4 9 7 17  0  Difficult doing work/chores Not difficult at all Not difficult at all Not difficult at all Very difficult Not difficult at all  Some recent data might be hidden   PHQ-2/9 Result is negative.    Fall Risk: Fall Risk  07/26/2018 07/11/2018 04/16/2018 01/21/2018 12/26/2017  Falls in the past year? 0 0 0 0 No  Number falls in past yr: 0 0 0 - -  Injury with Fall? 0 0 0 - -  Risk Factor Category  - - - - -  Risk for fall due to : - - - - -  Risk for fall due to: Comment - - - - -  Follow up - Falls evaluation completed - - -     Assessment & Plan  1. Other constipation  Continue Linzess and high fiber and diet  2. Mild episode of recurrent major depressive disorder ([place])  Improving, she states her 4 children are checking on her and stopping by to see her daily Explained she can take Seroquel prn, continue life style modification, sleeping well  now    I discussed the assessment and treatment plan with the patient. The patient was provided an opportunity to ask questions and all were answered. The patient agreed with the plan and demonstrated an understanding of the instructions.  The patient was advised to call back or seek an in-person evaluation if the symptoms worsen or if the condition fails to improve as anticipated.  I provided 15 minutes of non-face-to-face time during this encounter.

## 2018-07-29 ENCOUNTER — Other Ambulatory Visit: Payer: Self-pay | Admitting: Family Medicine

## 2018-07-29 DIAGNOSIS — F3160 Bipolar disorder, current episode mixed, unspecified: Secondary | ICD-10-CM

## 2018-07-29 NOTE — Telephone Encounter (Signed)
Refill request for general medication. Depakote to [facility]  Last office visit 07/26/2018   Follow up on 10/28/2018

## 2018-08-04 ENCOUNTER — Other Ambulatory Visit: Payer: Self-pay | Admitting: Rheumatology

## 2018-08-04 DIAGNOSIS — M797 Fibromyalgia: Secondary | ICD-10-CM

## 2018-08-05 NOTE — Telephone Encounter (Signed)
Last Visit: 03/06/2018 Next Visit: 09/03/2018   Okay to refill per Dr. Estanislado Pandy

## 2018-08-15 ENCOUNTER — Other Ambulatory Visit: Payer: Self-pay | Admitting: Family Medicine

## 2018-08-15 DIAGNOSIS — E785 Hyperlipidemia, unspecified: Secondary | ICD-10-CM

## 2018-08-19 ENCOUNTER — Other Ambulatory Visit: Payer: Self-pay | Admitting: Family Medicine

## 2018-08-19 DIAGNOSIS — Z1231 Encounter for screening mammogram for malignant neoplasm of breast: Secondary | ICD-10-CM

## 2018-08-21 NOTE — Progress Notes (Signed)
Office Visit Note  Patient: Jacqueline Lucas             Date of Birth: 22-Jun-1950           MRN: [id number]             PCP: [name], MD Referring: [name], MD Visit Date: 09/03/2018 Occupation: @GUAROCC @  Subjective:  Pain in joints and muscles.   History of Present Illness: Jacqueline Lucas is a 68 y.o. female history of fibromyalgia, osteoarthritis, and DDD lumbar spine. She is taking baclofen twice daily as needed and gabapentin 1 capsule twice daily and 2 capsules at bedtime. She finds them to be moderately effective.  She states she is having a fibromyalgia flare.  She has been under more stress and has increased anxiety since her husband's passing earlier this year. She does not sleep through the night and gets 6-7 hours of sleep total in 1 hour increments.  She is experiencing mental fog and fatigue. She continues to have discomfort in bilateral hips, knees, and back. She continues to have discomfort in left shoulder.  She is to have left shoulder surgery with Dr. Marlou Sa but he requested our office would need to give clearance due to recent development of rash on bilateral legs and arms.    Activities of Daily Living:  Patient reports morning stiffness for 30  minute.   Patient Reports nocturnal pain.  Difficulty dressing/grooming: Denies Difficulty climbing stairs: Reports Difficulty getting out of chair: Denies Difficulty using hands for taps, buttons, cutlery, and/or writing: Denies  Review of Systems  Constitutional: Positive for fatigue. Negative for night sweats, weight gain and weight loss.  HENT: Positive for mouth dryness. Negative for mouth sores, trouble swallowing, trouble swallowing and nose dryness.   Eyes: Positive for dryness. Negative for pain, redness and visual disturbance.  Respiratory: Negative for cough, shortness of breath and difficulty breathing.   Cardiovascular: Negative for chest pain, palpitations, hypertension, irregular  heartbeat and swelling in legs/feet.  Gastrointestinal: Positive for constipation and diarrhea. Negative for abdominal pain and blood in stool.  Endocrine: Negative for increased urination.  Genitourinary: Positive for painful urination. Negative for vaginal dryness.  Musculoskeletal: Positive for arthralgias, joint pain, morning stiffness and muscle tenderness. Negative for joint swelling, myalgias, muscle weakness and myalgias.  Skin: Positive for rash. Negative for color change, hair loss, skin tightness, ulcers and sensitivity to sunlight.  Allergic/Immunologic: Negative for susceptible to infections.  Neurological: Positive for headaches. Negative for dizziness, memory loss, night sweats and weakness.  Hematological: Negative for swollen glands.  Psychiatric/Behavioral: Positive for sleep disturbance. Negative for depressed mood. The patient is nervous/anxious.     PMFS History:  Patient Active Problem List   Diagnosis Date Noted  . Lumbar stenosis 02/04/2018  . Hx of hysterectomy 08/13/2017  . Pain in left wrist 06/12/2017  . Chronic venous insufficiency 06/09/2017  . Varicose veins of both lower extremities with pain 04/30/2017  . Claudication ([place]) 04/30/2017  . Arthritis of carpometacarpal [facility]) joint of left thumb 05/12/2016  . Bilateral thumb pain 05/01/2016  . Anemia 02/03/2016  . Status post bilateral total hip replacement 01/10/2016  . Sicca ([place]) 01/10/2016  . Age related osteoporosis 01/10/2016  . Hip arthritis 08/16/2015  . Microscopic hematuria 07/30/2015  . Vaginal atrophy 07/30/2015  . GAD (generalized anxiety disorder) 07/08/2015  . Moderate episode of recurrent major depressive disorder ([place]) 07/08/2015  . Migraine with aura and without status migrainosus, not intractable 07/08/2015  . Dyslipidemia 07/08/2015  .  Degenerative arthritis of right knee 03/16/2015  . Atrophic vaginitis 01/31/2015  . Recurrent UTI 12/31/2014  . IBS (irritable bowel syndrome)  10/29/2014  . Asthma, moderate persistent 09/08/2014  . Allergic state 09/07/2014  . Colon polyp 09/07/2014  . Hemorrhoid 09/07/2014  . Constrictive tenosynovitis 03/12/2014  . H/O total knee replacement, bilateral 07/31/2013  . CAD (coronary artery disease) 11/28/2012  . Anxiety 11/28/2012  . Acid reflux 11/28/2012  . Cardiac murmur 11/28/2012  . BP (high blood pressure) 11/28/2012  . Cannot sleep 11/28/2012  . Adiposity 11/28/2012  . Restless leg 11/28/2012  . Supraventricular tachycardia ([place]) 11/28/2012  . Fibromyalgia 11/28/2012  . Tachycardia 11/28/2012    Past Medical History:  Diagnosis Date  . Allergy   . Allergy to adhesive tape    Allergy to paper tape as well  . Alzheimer disease ([place])   . Anemia   . Anginal pain ([place])   . Anxiety   . Arthritis   . Asthma   . Bronchitis   . Cataract   . Chronic nausea   . Claustrophobia   . DDD (degenerative disc disease), lumbar   . Depression   . Dry eyes    uses Restasis  . Fibromyalgia   . GERD (gastroesophageal reflux disease)   . H/O bladder infections   . Heart murmur   . Hematuria   . Hyperlipidemia   . Hypertension   . Hypothyroidism   . IBS (irritable bowel syndrome)   . Insomnia   . Lumbar neuritis   . Migraines   . Obesity   . Osteoporosis   . Palpitation   . RLS (restless legs syndrome)   . Shoulder fracture, right   . Sicca ([place])   . Tachycardia   . Thyroid disease     Family History  Problem Relation Age of Onset  . Diabetes Mother   . Hypertension Mother   . Heart disease Mother   . Alzheimer's disease Mother   . Cancer Father   . Diabetes Father   . Hypertension Sister   . Depression Sister   . Fibromyalgia Sister   . Cancer Brother   . Heart disease Brother   . Stroke Son   . Fibromyalgia Daughter   . Bladder Cancer Neg Hx   . Kidney disease Neg Hx   . Kidney cancer Neg Hx    Past Surgical History:  Procedure Laterality Date  . ABDOMINAL HYSTERECTOMY     due to cancer of  ovaries  . BACK SURGERY N/A 2014   x 5  . BACK SURGERY  12/2017  . CARDIAC CATHETERIZATION     no PCI; 12/18/12 ([facility], Dr. Marcello Moores): EP study with ablation. No coronary angiography done then.  . COLONOSCOPY W/ POLYPECTOMY    . COLONOSCOPY WITH PROPOFOL N/A 02/04/2016   Procedure: COLONOSCOPY WITH PROPOFOL;  Surgeon: Robert Bellow, MD;  Location: [facility] ENDOSCOPY;  Service: Endoscopy;  Laterality: N/A;  . ESOPHAGOGASTRODUODENOSCOPY (EGD) WITH PROPOFOL N/A 02/04/2016   Procedure: ESOPHAGOGASTRODUODENOSCOPY (EGD) WITH PROPOFOL;  Surgeon: Robert Bellow, MD;  Location: [facility] ENDOSCOPY;  Service: Endoscopy;  Laterality: N/A;  . FINGER ARTHROSCOPY WITH CARPOMETACARPEL ([place]) ARTHROPLASTY Left 05/01/2016   Dr. Erlinda Hong  . GIVENS CAPSULE STUDY N/A 05/02/2016   Procedure: GIVENS CAPSULE STUDY;  Surgeon: Manya Silvas, MD;  Location: [facility] ENDOSCOPY;  Service: Endoscopy;  Laterality: N/A;  . HAND SURGERY    . HEMORRHOID SURGERY    . HIP ARTHROPLASTY    . JOINT REPLACEMENT Bilateral  knee  . KNEE ARTHROPLASTY    . REPLACEMENT TOTAL KNEE Left   . SPINE SURGERY     x 2  . tendonititis     right elbow, right wrist  . TOTAL HIP ARTHROPLASTY Left 2013  . TOTAL HIP ARTHROPLASTY Right 08/16/2015   Procedure: TOTAL HIP ARTHROPLASTY ANTERIOR APPROACH;  Surgeon: Meredith Pel, MD;  Location: [place];  Service: Orthopedics;  Laterality: Right;  . TOTAL KNEE ARTHROPLASTY Right 03/16/2015   Procedure: RIGHT TOTAL KNEE ARTHROPLASTY, PCL SACRIFICING;  Surgeon: Meredith Pel, MD;  Location: [place];  Service: Orthopedics;  Laterality: Right;   Social History   Social History Narrative   Patient has 10 grandchildren, 4 great-granddaughters & 2 great-grandsons.   Widow since 02/2018   Immunization History  Administered Date(s) Administered  . Influenza, High Dose Seasonal PF 02/15/2015, 11/10/2015, 01/11/2017, 12/26/2017  . Influenza-Unspecified 02/25/2014  . Pneumococcal Conjugate-13 06/03/2013  .  Pneumococcal Polysaccharide-23 09/05/2011, 01/11/2017  . Tdap 09/05/2011  . Zoster 12/12/2010     Objective: Vital Signs: BP 110/71 (BP Location: Left Arm, Patient Position: Sitting, Cuff Size: Normal)   Pulse 73   Resp 15   Ht 5\' 4"  (1.626 m)   Wt 208 lb (94.3 kg)   BMI 35.70 kg/m    Physical Exam Vitals signs and nursing note reviewed.  Constitutional:      Appearance: She is well-developed.  HENT:     Head: Normocephalic and atraumatic.  Eyes:     Conjunctiva/sclera: Conjunctivae normal.  Neck:     Musculoskeletal: Normal range of motion.  Cardiovascular:     Rate and Rhythm: Normal rate and regular rhythm.     Heart sounds: Normal heart sounds.  Pulmonary:     Effort: Pulmonary effort is normal.     Breath sounds: Normal breath sounds.  Abdominal:     General: Bowel sounds are normal.     Palpations: Abdomen is soft.  Lymphadenopathy:     Cervical: No cervical adenopathy.  Skin:    General: Skin is warm and dry.     Capillary Refill: Capillary refill takes less than 2 seconds.     Comments: She has multiple excoriations marks on her upper and lower extremities.  Neurological:     Mental Status: She is alert and oriented to person, place, and time.  Psychiatric:        Behavior: Behavior normal.      Musculoskeletal Exam: Patient had limited range of motion of her thoracic and lumbar spine.  She had mild multiple tender points in the trapezius area and upper thoracic region.  Shoulder joints had limited range of motion with discomfort.  Elbow joints and wrist joints with good range of motion.  She has mild DIP and PIP thickening.  His knee joints ankles MTPs PIPs with good range of motion.  She has had bilateral hip joint and knee joint replacements.  CDAI Exam: CDAI Score: - Patient Global: -; Provider Global: - Swollen: -; Tender: - Joint Exam   No joint exam has been documented for this visit   There is currently no information documented on the  homunculus. Go to the Rheumatology activity and complete the homunculus joint exam.  Investigation: No additional findings.  Imaging: No results found.  Recent Labs: Lab Results  Component Value Date   WBC 5.7 01/05/2018   HGB 13.1 01/05/2018   PLT 236 01/05/2018   NA 136 01/05/2018   K 4.2 01/05/2018   CL 101 01/05/2018  CO2 27 01/05/2018   GLUCOSE 127 (H) 01/05/2018   BUN 20 01/05/2018   CREATININE 0.91 01/05/2018   BILITOT 0.4 12/26/2017   ALKPHOS 105 12/10/2015   AST 12 12/26/2017   ALT 13 12/26/2017   PROT 5.9 (L) 12/26/2017   ALBUMIN 4.0 12/10/2015   CALCIUM 9.0 01/05/2018   GFRAA >60 01/05/2018    Speciality Comments: No specialty comments available.  Procedures:  No procedures performed Allergies: Morphine, Diazepam, Lyrica [pregabalin], Metformin and related, Latex, Rash away  [petrolatum-zinc oxide], Salicylates, and Tape   Assessment / Plan:     Visit Diagnoses: Fibromyalgia -patient continues to have some generalized pain and discomfort.  She has positive tender points.  She is on gabapentin.  She wants refills on gabapentin.  Trochanteric bursitis of both hips -she continues to have a lot of discomfort in bilateral trochanteric area.  She was requesting cortisone injection.  I declined the injection as she is scheduled to have shoulder joint surgery.  I advised her in case she does not have surgery then she can call us to get the trochanteric bursa injections.  DDD (degenerative disc disease), lumbar - Plan: She continues to have discomfort in her lower back.  Status post bilateral total hip replacement -.  Doing well.  H/O total knee replacement, bilateral   Primary osteoarthritis of first carpometacarpal joint of left hand -joint protection was discussed.  Rash-patient has some excoriation marks on upper and lower extremities.  I have advised her to schedule an appointment with her PCP.  Keratoconjunctivitis sicca ([place]) - Plan: Over-the-counter  products were discussed.  Osteopenia of multiple sites - Plan: Patient is on calcium and vitamin D.  History of hypertension -  History of anxiety   History of depression   History of cardiac murmur   Restless leg   History of anemia   History of gastroesophageal reflux (GERD)    Orders: No orders of the defined types were placed in this encounter.  Meds ordered this encounter  Medications  . gabapentin (NEURONTIN) 300 MG capsule    Sig: TAKE 1 CAPSULE EVERY MORNING, TAKE 1 CAPSULE AT NOON AND TAKE 2 CAPSULES   (600MG ) IN THE EVENING    Dispense:  360 capsule    Refill:  0      Follow-Up Instructions: Return in about 6 months (around 03/06/2019) for FMS.   Bo Merino, MD  Note - This record has been created using Editor, commissioning.  Chart creation errors have been sought, but may not always  have been located. Such creation errors do not reflect on  the standard of medical care.

## 2018-08-22 ENCOUNTER — Other Ambulatory Visit: Payer: Self-pay | Admitting: Family Medicine

## 2018-08-22 DIAGNOSIS — G43109 Migraine with aura, not intractable, without status migrainosus: Secondary | ICD-10-CM

## 2018-08-22 DIAGNOSIS — I10 Essential (primary) hypertension: Secondary | ICD-10-CM

## 2018-08-22 NOTE — Telephone Encounter (Signed)
Hypertension medication request: Metoprolol   Last office visit pertaining to hypertension:  06/26/2018   Olmesartan-HCTZ was sent in on 06/26/2018 with 90-Would not need this prescription.   BP Readings from Last 3 Encounters:  06/26/18 127/66  04/16/18 114/62  03/06/18 139/67    Lab Results  Component Value Date   CREATININE 0.91 01/05/2018   BUN 20 01/05/2018   NA 136 01/05/2018   K 4.2 01/05/2018   CL 101 01/05/2018   CO2 27 01/05/2018    Follow up on 10/28/2018

## 2018-08-23 ENCOUNTER — Ambulatory Visit: Payer: Medicare Other

## 2018-08-26 ENCOUNTER — Ambulatory Visit (INDEPENDENT_AMBULATORY_CARE_PROVIDER_SITE_OTHER): Payer: Medicare Other | Admitting: Orthopedic Surgery

## 2018-08-26 ENCOUNTER — Other Ambulatory Visit: Payer: Self-pay

## 2018-08-26 ENCOUNTER — Encounter: Payer: Self-pay | Admitting: Orthopedic Surgery

## 2018-08-26 DIAGNOSIS — S43432A Superior glenoid labrum lesion of left shoulder, initial encounter: Secondary | ICD-10-CM | POA: Diagnosis not present

## 2018-08-26 DIAGNOSIS — I209 Angina pectoris, unspecified: Secondary | ICD-10-CM | POA: Diagnosis not present

## 2018-08-28 ENCOUNTER — Encounter: Payer: Self-pay | Admitting: Orthopedic Surgery

## 2018-08-28 NOTE — Progress Notes (Signed)
Office Visit Note   Patient: Jacqueline Lucas           Date of Birth: 26-Mar-1950           MRN: [id number] Visit Date: 08/26/2018 Requested by: [name], [address] [place],  [place] [id number] PCP: [name], MD  Subjective: Chief Complaint  Patient presents with  . Right Shoulder - Pain  . Left Shoulder - Pain    HPI: Jacqueline Lucas is a patient with bilateral shoulder pain left worse than right.  She had MRI scan of 2018 which did show intact rotator cuff and large SLAP tear.  Has been living with it since that time but now she would like to have something done.  She has rotator cuff pathology on the right.  Her reasoning is correct that the left shoulder surgery would be an easier 1 to get over.  I think that we could consider biceps tenotomy for that problem and she could do well with that.  She states that she currently lives alone because her husband passed away last year.  Hurts her to lift bowls and plates.  She has been dropping things.  She has a son who can stay with her as well as a friend.              ROS: All systems reviewed are negative as they relate to the chief complaint within the history of present illness.  Patient denies  fevers or chills.   Assessment & Plan: Visit Diagnoses:  1. Superior labrum anterior-to-posterior (SLAP) tear of left shoulder     Plan: Impression is bilateral shoulder pain left worse than right with SLAP tear in that left shoulder.  Plan is left shoulder arthroscopy with biceps tendon release.  Risk and benefits are discussed including but limited to infection nerve vessel damage incomplete pain relief as well as potential spasming in that biceps muscle with repetitive activity; however, I do not think that she is doing enough repetitive activity that that would be a problem.  I think it would be a quicker recovery for her to have the tenotomy as opposed to the biceps tenodesis.  Patient understands the risk and benefits  of surgery and wishes to proceed.  All questions answered  Follow-Up Instructions: No follow-ups on file.   Orders:  No orders of the defined types were placed in this encounter.  No orders of the defined types were placed in this encounter.     Procedures: No procedures performed   Clinical Data: No additional findings.  Objective: Vital Signs: There were no vitals taken for this visit.  Physical Exam:   Constitutional: Patient appears well-developed HEENT:  Head: Normocephalic Eyes:EOM are normal Neck: Normal range of motion Cardiovascular: Normal rate Pulmonary/chest: Effort normal Neurologic: Patient is alert Skin: Skin is warm Psychiatric: Patient has normal mood and affect    Ortho Exam: Ortho exam demonstrates good cervical spine range of motion.  She does have maintained passive range of motion at left shoulder but O'Brien's testing is positive cuff strength is intact and there is no real coarse grinding or crepitus with active or passive range of motion of that left arm.  No other masses lymphadenopathy or skin changes noted in that left shoulder girdle region.  No AC joint tenderness to direct palpation.  Specialty Comments:  No specialty comments available.  Imaging: No results found.   PMFS History: Patient Active Problem List   Diagnosis Date Noted  .  Lumbar stenosis 02/04/2018  . Hx of hysterectomy 08/13/2017  . Pain in left wrist 06/12/2017  . Chronic venous insufficiency 06/09/2017  . Varicose veins of both lower extremities with pain 04/30/2017  . Claudication ([place]) 04/30/2017  . Arthritis of carpometacarpal [facility]) joint of left thumb 05/12/2016  . Bilateral thumb pain 05/01/2016  . Anemia 02/03/2016  . Status post bilateral total hip replacement 01/10/2016  . Sicca ([place]) 01/10/2016  . Age related osteoporosis 01/10/2016  . Hip arthritis 08/16/2015  . Microscopic hematuria 07/30/2015  . Vaginal atrophy 07/30/2015  . GAD (generalized  anxiety disorder) 07/08/2015  . Moderate episode of recurrent major depressive disorder ([place]) 07/08/2015  . Migraine with aura and without status migrainosus, not intractable 07/08/2015  . Dyslipidemia 07/08/2015  . Degenerative arthritis of right knee 03/16/2015  . Atrophic vaginitis 01/31/2015  . Recurrent UTI 12/31/2014  . IBS (irritable bowel syndrome) 10/29/2014  . Asthma, moderate persistent 09/08/2014  . Allergic state 09/07/2014  . Colon polyp 09/07/2014  . Hemorrhoid 09/07/2014  . Constrictive tenosynovitis 03/12/2014  . H/O total knee replacement, bilateral 07/31/2013  . CAD (coronary artery disease) 11/28/2012  . Anxiety 11/28/2012  . Acid reflux 11/28/2012  . Cardiac murmur 11/28/2012  . BP (high blood pressure) 11/28/2012  . Cannot sleep 11/28/2012  . Adiposity 11/28/2012  . Restless leg 11/28/2012  . Supraventricular tachycardia ([place]) 11/28/2012  . Fibromyalgia 11/28/2012  . Tachycardia 11/28/2012   Past Medical History:  Diagnosis Date  . Allergy   . Allergy to adhesive tape    Allergy to paper tape as well  . Alzheimer disease ([place])   . Anemia   . Anginal pain ([place])   . Anxiety   . Arthritis   . Asthma   . Bronchitis   . Cataract   . Chronic nausea   . Claustrophobia   . DDD (degenerative disc disease), lumbar   . Depression   . Dry eyes    uses Restasis  . Fibromyalgia   . GERD (gastroesophageal reflux disease)   . H/O bladder infections   . Heart murmur   . Hematuria   . Hyperlipidemia   . Hypertension   . Hypothyroidism   . IBS (irritable bowel syndrome)   . Insomnia   . Lumbar neuritis   . Migraines   . Obesity   . Osteoporosis   . Palpitation   . PONV (postoperative nausea and vomiting)   . RLS (restless legs syndrome)   . Shoulder fracture, right   . Sicca ([place])   . Tachycardia   . Thyroid disease     Family History  Problem Relation Age of Onset  . Diabetes Mother   . Hypertension Mother   . Heart disease Mother   .  Alzheimer's disease Mother   . Cancer Father   . Diabetes Father   . Hypertension Sister   . Depression Sister   . Fibromyalgia Sister   . Cancer Brother   . Heart disease Brother   . Stroke Son   . Fibromyalgia Daughter   . Bladder Cancer Neg Hx   . Kidney disease Neg Hx   . Kidney cancer Neg Hx     Past Surgical History:  Procedure Laterality Date  . ABDOMINAL HYSTERECTOMY     due to cancer of ovaries  . BACK SURGERY N/A 2014   x 5  . BACK SURGERY  12/2017  . CARDIAC CATHETERIZATION     no PCI; 12/18/12 ([facility], Dr. Marcello Moores): EP study with ablation. No  coronary angiography done then.  . COLONOSCOPY W/ POLYPECTOMY    . COLONOSCOPY WITH PROPOFOL N/A 02/04/2016   Procedure: COLONOSCOPY WITH PROPOFOL;  Surgeon: Robert Bellow, MD;  Location: [facility] ENDOSCOPY;  Service: Endoscopy;  Laterality: N/A;  . ESOPHAGOGASTRODUODENOSCOPY (EGD) WITH PROPOFOL N/A 02/04/2016   Procedure: ESOPHAGOGASTRODUODENOSCOPY (EGD) WITH PROPOFOL;  Surgeon: Robert Bellow, MD;  Location: [facility] ENDOSCOPY;  Service: Endoscopy;  Laterality: N/A;  . FINGER ARTHROSCOPY WITH CARPOMETACARPEL ([place]) ARTHROPLASTY Left 05/01/2016   Dr. Erlinda Hong  . GIVENS CAPSULE STUDY N/A 05/02/2016   Procedure: GIVENS CAPSULE STUDY;  Surgeon: Manya Silvas, MD;  Location: [facility] ENDOSCOPY;  Service: Endoscopy;  Laterality: N/A;  . HAND SURGERY    . HEMORRHOID SURGERY    . HIP ARTHROPLASTY    . JOINT REPLACEMENT Bilateral    knee  . KNEE ARTHROPLASTY    . REPLACEMENT TOTAL KNEE Left   . SPINE SURGERY     x 2  . tendonititis     right elbow, right wrist  . TOTAL HIP ARTHROPLASTY Left 2013  . TOTAL HIP ARTHROPLASTY Right 08/16/2015   Procedure: TOTAL HIP ARTHROPLASTY ANTERIOR APPROACH;  Surgeon: Meredith Pel, MD;  Location: [place];  Service: Orthopedics;  Laterality: Right;  . TOTAL KNEE ARTHROPLASTY Right 03/16/2015   Procedure: RIGHT TOTAL KNEE ARTHROPLASTY, PCL SACRIFICING;  Surgeon: Meredith Pel, MD;  Location: [place];   Service: Orthopedics;  Laterality: Right;   Social History   Occupational History  . Occupation: Retired  Tobacco Use  . Smoking status: Never Smoker  . Smokeless tobacco: Never Used  . Tobacco comment: smoking cessation materials not required  Substance and Sexual Activity  . Alcohol use: No    Alcohol/week: 0.0 standard drinks  . Drug use: No  . Sexual activity: Never

## 2018-09-01 ENCOUNTER — Telehealth: Payer: Self-pay | Admitting: Family Medicine

## 2018-09-03 ENCOUNTER — Ambulatory Visit (INDEPENDENT_AMBULATORY_CARE_PROVIDER_SITE_OTHER): Payer: Medicare Other | Admitting: Rheumatology

## 2018-09-03 ENCOUNTER — Other Ambulatory Visit: Payer: Self-pay

## 2018-09-03 ENCOUNTER — Telehealth: Payer: Self-pay | Admitting: Orthopedic Surgery

## 2018-09-03 ENCOUNTER — Encounter (HOSPITAL_BASED_OUTPATIENT_CLINIC_OR_DEPARTMENT_OTHER): Payer: Self-pay | Admitting: *Deleted

## 2018-09-03 ENCOUNTER — Encounter: Payer: Self-pay | Admitting: Rheumatology

## 2018-09-03 VITALS — BP 110/71 | HR 73 | Resp 15 | Ht 64.0 in | Wt 208.0 lb

## 2018-09-03 DIAGNOSIS — M1812 Unilateral primary osteoarthritis of first carpometacarpal joint, left hand: Secondary | ICD-10-CM | POA: Diagnosis not present

## 2018-09-03 DIAGNOSIS — M5136 Other intervertebral disc degeneration, lumbar region: Secondary | ICD-10-CM | POA: Diagnosis not present

## 2018-09-03 DIAGNOSIS — Z8719 Personal history of other diseases of the digestive system: Secondary | ICD-10-CM

## 2018-09-03 DIAGNOSIS — Z8679 Personal history of other diseases of the circulatory system: Secondary | ICD-10-CM

## 2018-09-03 DIAGNOSIS — Z862 Personal history of diseases of the blood and blood-forming organs and certain disorders involving the immune mechanism: Secondary | ICD-10-CM

## 2018-09-03 DIAGNOSIS — M8589 Other specified disorders of bone density and structure, multiple sites: Secondary | ICD-10-CM

## 2018-09-03 DIAGNOSIS — M7062 Trochanteric bursitis, left hip: Secondary | ICD-10-CM

## 2018-09-03 DIAGNOSIS — Z96653 Presence of artificial knee joint, bilateral: Secondary | ICD-10-CM | POA: Diagnosis not present

## 2018-09-03 DIAGNOSIS — Z96643 Presence of artificial hip joint, bilateral: Secondary | ICD-10-CM

## 2018-09-03 DIAGNOSIS — G2581 Restless legs syndrome: Secondary | ICD-10-CM | POA: Diagnosis not present

## 2018-09-03 DIAGNOSIS — Z8659 Personal history of other mental and behavioral disorders: Secondary | ICD-10-CM | POA: Diagnosis not present

## 2018-09-03 DIAGNOSIS — M3501 Sicca syndrome with keratoconjunctivitis: Secondary | ICD-10-CM | POA: Diagnosis not present

## 2018-09-03 DIAGNOSIS — M797 Fibromyalgia: Secondary | ICD-10-CM

## 2018-09-03 DIAGNOSIS — I209 Angina pectoris, unspecified: Secondary | ICD-10-CM

## 2018-09-03 DIAGNOSIS — M7061 Trochanteric bursitis, right hip: Secondary | ICD-10-CM | POA: Diagnosis not present

## 2018-09-03 DIAGNOSIS — R21 Rash and other nonspecific skin eruption: Secondary | ICD-10-CM

## 2018-09-03 MED ORDER — GABAPENTIN 300 MG PO CAPS: ORAL_CAPSULE | ORAL | 0 refills | Status: DC

## 2018-09-03 NOTE — Telephone Encounter (Signed)
Jacqueline Lucas should be okay.  This is an arthroscopic procedure so I do not see that being a problem.

## 2018-09-03 NOTE — Telephone Encounter (Signed)
Please advise. Thanks.  

## 2018-09-03 NOTE — Telephone Encounter (Signed)
Patient called stating she spoke with the nurse for preadmission instructions on the phone this morning and mentioned that she had a rash on her left leg, right arm, and hand.  Patient states she was told to contact Dr. Marlou Sa to see if he needed to see it prior to surgery.  Patient states she is scheduled to see Dr. Estanislado Pandy today 09/03/18 at 3:15 and will have her evaluate it.

## 2018-09-04 NOTE — Telephone Encounter (Signed)
IC s/w patient and advised per Dr Marlou Sa.

## 2018-09-05 ENCOUNTER — Other Ambulatory Visit (HOSPITAL_COMMUNITY)
Admission: RE | Admit: 2018-09-05 | Discharge: 2018-09-05 | Disposition: A | Discharge status: Home / Self Care | Payer: Medicare Other | Source: Ambulatory Visit | Attending: Orthopedic Surgery | Admitting: Orthopedic Surgery

## 2018-09-05 DIAGNOSIS — Z01812 Encounter for preprocedural laboratory examination: Secondary | ICD-10-CM | POA: Diagnosis not present

## 2018-09-05 DIAGNOSIS — Z1159 Encounter for screening for other viral diseases: Secondary | ICD-10-CM | POA: Insufficient documentation

## 2018-09-06 ENCOUNTER — Other Ambulatory Visit: Payer: Self-pay

## 2018-09-06 ENCOUNTER — Encounter (HOSPITAL_BASED_OUTPATIENT_CLINIC_OR_DEPARTMENT_OTHER)
Admission: RE | Admit: 2018-09-06 | Discharge: 2018-09-06 | Disposition: A | Discharge status: Home / Self Care | Payer: Medicare Other | Source: Ambulatory Visit | Attending: Orthopedic Surgery | Admitting: Orthopedic Surgery

## 2018-09-06 DIAGNOSIS — M3500 Sicca syndrome, unspecified: Secondary | ICD-10-CM | POA: Diagnosis not present

## 2018-09-06 DIAGNOSIS — E039 Hypothyroidism, unspecified: Secondary | ICD-10-CM | POA: Diagnosis not present

## 2018-09-06 DIAGNOSIS — I10 Essential (primary) hypertension: Secondary | ICD-10-CM | POA: Diagnosis not present

## 2018-09-06 DIAGNOSIS — X58XXXA Exposure to other specified factors, initial encounter: Secondary | ICD-10-CM | POA: Diagnosis not present

## 2018-09-06 DIAGNOSIS — M1990 Unspecified osteoarthritis, unspecified site: Secondary | ICD-10-CM | POA: Diagnosis not present

## 2018-09-06 DIAGNOSIS — F0280 Dementia in other diseases classified elsewhere without behavioral disturbance: Secondary | ICD-10-CM | POA: Diagnosis not present

## 2018-09-06 DIAGNOSIS — G309 Alzheimer's disease, unspecified: Secondary | ICD-10-CM | POA: Diagnosis not present

## 2018-09-06 DIAGNOSIS — E785 Hyperlipidemia, unspecified: Secondary | ICD-10-CM | POA: Diagnosis not present

## 2018-09-06 DIAGNOSIS — E669 Obesity, unspecified: Secondary | ICD-10-CM | POA: Diagnosis not present

## 2018-09-06 DIAGNOSIS — Z6836 Body mass index (BMI) 36.0-36.9, adult: Secondary | ICD-10-CM | POA: Diagnosis not present

## 2018-09-06 DIAGNOSIS — K589 Irritable bowel syndrome without diarrhea: Secondary | ICD-10-CM | POA: Diagnosis not present

## 2018-09-06 DIAGNOSIS — I491 Atrial premature depolarization: Secondary | ICD-10-CM | POA: Diagnosis not present

## 2018-09-06 DIAGNOSIS — R011 Cardiac murmur, unspecified: Secondary | ICD-10-CM | POA: Diagnosis not present

## 2018-09-06 DIAGNOSIS — M797 Fibromyalgia: Secondary | ICD-10-CM | POA: Diagnosis not present

## 2018-09-06 DIAGNOSIS — F419 Anxiety disorder, unspecified: Secondary | ICD-10-CM | POA: Diagnosis not present

## 2018-09-06 DIAGNOSIS — G4700 Insomnia, unspecified: Secondary | ICD-10-CM | POA: Diagnosis not present

## 2018-09-06 DIAGNOSIS — I4510 Unspecified right bundle-branch block: Secondary | ICD-10-CM | POA: Diagnosis not present

## 2018-09-06 DIAGNOSIS — J45909 Unspecified asthma, uncomplicated: Secondary | ICD-10-CM | POA: Diagnosis not present

## 2018-09-06 DIAGNOSIS — G2581 Restless legs syndrome: Secondary | ICD-10-CM | POA: Diagnosis not present

## 2018-09-06 DIAGNOSIS — M5136 Other intervertebral disc degeneration, lumbar region: Secondary | ICD-10-CM | POA: Diagnosis not present

## 2018-09-06 DIAGNOSIS — F329 Major depressive disorder, single episode, unspecified: Secondary | ICD-10-CM | POA: Diagnosis not present

## 2018-09-06 DIAGNOSIS — R002 Palpitations: Secondary | ICD-10-CM | POA: Diagnosis not present

## 2018-09-06 DIAGNOSIS — S43432A Superior glenoid labrum lesion of left shoulder, initial encounter: Secondary | ICD-10-CM | POA: Diagnosis not present

## 2018-09-06 DIAGNOSIS — M810 Age-related osteoporosis without current pathological fracture: Secondary | ICD-10-CM | POA: Diagnosis not present

## 2018-09-06 DIAGNOSIS — K219 Gastro-esophageal reflux disease without esophagitis: Secondary | ICD-10-CM | POA: Diagnosis not present

## 2018-09-06 LAB — BASIC METABOLIC PANEL
Anion gap: 10 (ref 5–15)
BUN: 13 mg/dL (ref 8–23)
CO2: 24 mmol/L (ref 22–32)
Calcium: 9.1 mg/dL (ref 8.9–10.3)
Chloride: 105 mmol/L (ref 98–111)
Creatinine, Ser: 0.84 mg/dL (ref 0.44–1.00)
GFR calc Af Amer: >60 mL/min (ref >60)
GFR calc non Af Amer: >60 mL/min (ref >60)
Glucose, Bld: 113 mg/dL — ABNORMAL HIGH (ref 70–99)
Potassium: 3.8 mmol/L (ref 3.5–5.1)
Sodium: 139 mmol/L (ref 135–145)

## 2018-09-06 LAB — CBC
HCT: 36.9 % (ref 36.0–46.0)
Hemoglobin: 11.8 g/dL — ABNORMAL LOW (ref 12.0–15.0)
MCH: 28 pg (ref 26.0–34.0)
MCHC: 32 g/dL (ref 30.0–36.0)
MCV: 87.4 fL (ref 80.0–100.0)
Platelets: 242 10*3/uL (ref 150–400)
RBC: 4.22 MIL/uL (ref 3.87–5.11)
RDW: 15.2 % (ref 11.5–15.5)
WBC: 6.6 10*3/uL (ref 4.0–10.5)
nRBC: 0 % (ref 0.0–0.2)

## 2018-09-06 LAB — SARS CORONAVIRUS 2 (TAT 6-24 HRS): SARS Coronavirus 2: NEGATIVE

## 2018-09-06 NOTE — Progress Notes (Signed)
Chart and EKG reviewed with Dr Fransisco Beau. Pt denies chest pain or SOB. Ok to proceed with surgery as scheduled.

## 2018-09-09 ENCOUNTER — Other Ambulatory Visit: Payer: Self-pay

## 2018-09-09 ENCOUNTER — Encounter (HOSPITAL_BASED_OUTPATIENT_CLINIC_OR_DEPARTMENT_OTHER)
Admission: RE | Disposition: A | Discharge status: Home / Self Care | Payer: Self-pay | Source: Home / Self Care | Attending: Orthopedic Surgery

## 2018-09-09 ENCOUNTER — Ambulatory Visit (HOSPITAL_BASED_OUTPATIENT_CLINIC_OR_DEPARTMENT_OTHER)
Admission: RE | Admit: 2018-09-09 | Discharge: 2018-09-09 | Disposition: A | Discharge status: Home / Self Care | Payer: Medicare Other | Attending: Orthopedic Surgery | Admitting: Orthopedic Surgery

## 2018-09-09 ENCOUNTER — Ambulatory Visit (HOSPITAL_BASED_OUTPATIENT_CLINIC_OR_DEPARTMENT_OTHER): Payer: Medicare Other | Admitting: Anesthesiology

## 2018-09-09 ENCOUNTER — Encounter (HOSPITAL_BASED_OUTPATIENT_CLINIC_OR_DEPARTMENT_OTHER): Payer: Self-pay | Admitting: *Deleted

## 2018-09-09 DIAGNOSIS — F0280 Dementia in other diseases classified elsewhere without behavioral disturbance: Secondary | ICD-10-CM | POA: Diagnosis not present

## 2018-09-09 DIAGNOSIS — M3500 Sicca syndrome, unspecified: Secondary | ICD-10-CM | POA: Insufficient documentation

## 2018-09-09 DIAGNOSIS — F329 Major depressive disorder, single episode, unspecified: Secondary | ICD-10-CM | POA: Insufficient documentation

## 2018-09-09 DIAGNOSIS — Z888 Allergy status to other drugs, medicaments and biological substances status: Secondary | ICD-10-CM | POA: Insufficient documentation

## 2018-09-09 DIAGNOSIS — Z7951 Long term (current) use of inhaled steroids: Secondary | ICD-10-CM | POA: Insufficient documentation

## 2018-09-09 DIAGNOSIS — G309 Alzheimer's disease, unspecified: Secondary | ICD-10-CM | POA: Insufficient documentation

## 2018-09-09 DIAGNOSIS — Z7982 Long term (current) use of aspirin: Secondary | ICD-10-CM | POA: Insufficient documentation

## 2018-09-09 DIAGNOSIS — I2510 Atherosclerotic heart disease of native coronary artery without angina pectoris: Secondary | ICD-10-CM | POA: Insufficient documentation

## 2018-09-09 DIAGNOSIS — E669 Obesity, unspecified: Secondary | ICD-10-CM | POA: Insufficient documentation

## 2018-09-09 DIAGNOSIS — Z96643 Presence of artificial hip joint, bilateral: Secondary | ICD-10-CM | POA: Insufficient documentation

## 2018-09-09 DIAGNOSIS — K219 Gastro-esophageal reflux disease without esophagitis: Secondary | ICD-10-CM | POA: Insufficient documentation

## 2018-09-09 DIAGNOSIS — J45909 Unspecified asthma, uncomplicated: Secondary | ICD-10-CM | POA: Diagnosis not present

## 2018-09-09 DIAGNOSIS — I491 Atrial premature depolarization: Secondary | ICD-10-CM | POA: Insufficient documentation

## 2018-09-09 DIAGNOSIS — E785 Hyperlipidemia, unspecified: Secondary | ICD-10-CM | POA: Insufficient documentation

## 2018-09-09 DIAGNOSIS — M1990 Unspecified osteoarthritis, unspecified site: Secondary | ICD-10-CM | POA: Diagnosis not present

## 2018-09-09 DIAGNOSIS — M5136 Other intervertebral disc degeneration, lumbar region: Secondary | ICD-10-CM | POA: Insufficient documentation

## 2018-09-09 DIAGNOSIS — S43432D Superior glenoid labrum lesion of left shoulder, subsequent encounter: Secondary | ICD-10-CM

## 2018-09-09 DIAGNOSIS — R000 Tachycardia, unspecified: Secondary | ICD-10-CM | POA: Insufficient documentation

## 2018-09-09 DIAGNOSIS — Z885 Allergy status to narcotic agent status: Secondary | ICD-10-CM | POA: Insufficient documentation

## 2018-09-09 DIAGNOSIS — F418 Other specified anxiety disorders: Secondary | ICD-10-CM | POA: Diagnosis not present

## 2018-09-09 DIAGNOSIS — I10 Essential (primary) hypertension: Secondary | ICD-10-CM | POA: Insufficient documentation

## 2018-09-09 DIAGNOSIS — Z886 Allergy status to analgesic agent status: Secondary | ICD-10-CM | POA: Insufficient documentation

## 2018-09-09 DIAGNOSIS — M797 Fibromyalgia: Secondary | ICD-10-CM | POA: Insufficient documentation

## 2018-09-09 DIAGNOSIS — R110 Nausea: Secondary | ICD-10-CM | POA: Insufficient documentation

## 2018-09-09 DIAGNOSIS — S43432A Superior glenoid labrum lesion of left shoulder, initial encounter: Secondary | ICD-10-CM | POA: Diagnosis not present

## 2018-09-09 DIAGNOSIS — Z96653 Presence of artificial knee joint, bilateral: Secondary | ICD-10-CM | POA: Insufficient documentation

## 2018-09-09 DIAGNOSIS — Z91040 Latex allergy status: Secondary | ICD-10-CM | POA: Insufficient documentation

## 2018-09-09 DIAGNOSIS — G4700 Insomnia, unspecified: Secondary | ICD-10-CM | POA: Insufficient documentation

## 2018-09-09 DIAGNOSIS — Z90710 Acquired absence of both cervix and uterus: Secondary | ICD-10-CM | POA: Insufficient documentation

## 2018-09-09 DIAGNOSIS — Z91048 Other nonmedicinal substance allergy status: Secondary | ICD-10-CM | POA: Insufficient documentation

## 2018-09-09 DIAGNOSIS — K589 Irritable bowel syndrome without diarrhea: Secondary | ICD-10-CM | POA: Insufficient documentation

## 2018-09-09 DIAGNOSIS — F419 Anxiety disorder, unspecified: Secondary | ICD-10-CM | POA: Diagnosis not present

## 2018-09-09 DIAGNOSIS — I4510 Unspecified right bundle-branch block: Secondary | ICD-10-CM | POA: Insufficient documentation

## 2018-09-09 DIAGNOSIS — G2581 Restless legs syndrome: Secondary | ICD-10-CM | POA: Insufficient documentation

## 2018-09-09 DIAGNOSIS — E039 Hypothyroidism, unspecified: Secondary | ICD-10-CM | POA: Insufficient documentation

## 2018-09-09 DIAGNOSIS — R011 Cardiac murmur, unspecified: Secondary | ICD-10-CM | POA: Insufficient documentation

## 2018-09-09 DIAGNOSIS — M810 Age-related osteoporosis without current pathological fracture: Secondary | ICD-10-CM | POA: Insufficient documentation

## 2018-09-09 DIAGNOSIS — Z6836 Body mass index (BMI) 36.0-36.9, adult: Secondary | ICD-10-CM | POA: Insufficient documentation

## 2018-09-09 DIAGNOSIS — X58XXXA Exposure to other specified factors, initial encounter: Secondary | ICD-10-CM | POA: Insufficient documentation

## 2018-09-09 DIAGNOSIS — Z791 Long term (current) use of non-steroidal anti-inflammatories (NSAID): Secondary | ICD-10-CM | POA: Insufficient documentation

## 2018-09-09 DIAGNOSIS — G8918 Other acute postprocedural pain: Secondary | ICD-10-CM | POA: Diagnosis not present

## 2018-09-09 DIAGNOSIS — Z79899 Other long term (current) drug therapy: Secondary | ICD-10-CM | POA: Insufficient documentation

## 2018-09-09 DIAGNOSIS — R002 Palpitations: Secondary | ICD-10-CM | POA: Insufficient documentation

## 2018-09-09 HISTORY — PX: SHOULDER ARTHROSCOPY: SHX128

## 2018-09-09 SURGERY — ARTHROSCOPY, SHOULDER
Anesthesia: General | Site: Shoulder | Laterality: Left

## 2018-09-09 MED ORDER — MIDAZOLAM HCL 2 MG/2ML IJ SOLN
1.0000 mg | INTRAMUSCULAR | Status: DC | PRN
  Administered 2018-09-09: 2 mg via INTRAVENOUS

## 2018-09-09 MED ORDER — BUPIVACAINE-EPINEPHRINE (PF) 0.5% -1:200000 IJ SOLN
INTRAMUSCULAR | Status: AC
  Filled 2018-09-09: qty 60

## 2018-09-09 MED ORDER — OXYCODONE HCL 5 MG PO TABS
5.0000 mg | ORAL_TABLET | Freq: Once | ORAL | Status: AC
  Administered 2018-09-09: 5 mg via ORAL

## 2018-09-09 MED ORDER — MEPERIDINE HCL 25 MG/ML IJ SOLN: 6.2500 mg | INTRAMUSCULAR | Status: DC | PRN

## 2018-09-09 MED ORDER — MIDAZOLAM HCL 2 MG/2ML IJ SOLN
INTRAMUSCULAR | Status: AC
  Filled 2018-09-09: qty 2

## 2018-09-09 MED ORDER — SCOPOLAMINE 1 MG/3DAYS TD PT72: 1.0000 | MEDICATED_PATCH | Freq: Once | TRANSDERMAL | Status: DC

## 2018-09-09 MED ORDER — CEFAZOLIN SODIUM-DEXTROSE 2-4 GM/100ML-% IV SOLN
INTRAVENOUS | Status: AC
  Filled 2018-09-09: qty 100

## 2018-09-09 MED ORDER — PHENYLEPHRINE 40 MCG/ML (10ML) SYRINGE FOR IV PUSH (FOR BLOOD PRESSURE SUPPORT)
PREFILLED_SYRINGE | INTRAVENOUS | Status: AC
  Filled 2018-09-09: qty 10

## 2018-09-09 MED ORDER — DEXAMETHASONE SODIUM PHOSPHATE 4 MG/ML IJ SOLN
INTRAMUSCULAR | Status: DC | PRN
  Administered 2018-09-09: 10 mg via INTRAVENOUS

## 2018-09-09 MED ORDER — ONDANSETRON HCL 4 MG/2ML IJ SOLN
INTRAMUSCULAR | Status: DC | PRN
  Administered 2018-09-09: 4 mg via INTRAVENOUS

## 2018-09-09 MED ORDER — PHENYLEPHRINE HCL (PRESSORS) 10 MG/ML IV SOLN
INTRAVENOUS | Status: AC
  Filled 2018-09-09: qty 1

## 2018-09-09 MED ORDER — CEFAZOLIN SODIUM-DEXTROSE 2-4 GM/100ML-% IV SOLN
2.0000 g | INTRAVENOUS | Status: AC
  Administered 2018-09-09: 08:00:00 2 g via INTRAVENOUS

## 2018-09-09 MED ORDER — BUPIVACAINE HCL (PF) 0.5 % IJ SOLN
INTRAMUSCULAR | Status: DC | PRN
  Administered 2018-09-09: 15 mL via PERINEURAL

## 2018-09-09 MED ORDER — EPINEPHRINE PF 1 MG/ML IJ SOLN
INTRAMUSCULAR | Status: DC | PRN
  Administered 2018-09-09: 2 mL

## 2018-09-09 MED ORDER — LACTATED RINGERS IV SOLN
INTRAVENOUS | Status: DC | PRN
  Administered 2018-09-09 (×2): via INTRAVENOUS

## 2018-09-09 MED ORDER — PROPOFOL 10 MG/ML IV BOLUS
INTRAVENOUS | Status: AC
  Filled 2018-09-09: qty 20

## 2018-09-09 MED ORDER — EPHEDRINE SULFATE 50 MG/ML IJ SOLN
INTRAMUSCULAR | Status: DC | PRN
  Administered 2018-09-09: 25 mg via INTRAVENOUS

## 2018-09-09 MED ORDER — EPINEPHRINE PF 1 MG/ML IJ SOLN
INTRAMUSCULAR | Status: AC
  Filled 2018-09-09: qty 10

## 2018-09-09 MED ORDER — ONDANSETRON HCL 4 MG/2ML IJ SOLN
INTRAMUSCULAR | Status: AC
  Filled 2018-09-09: qty 2

## 2018-09-09 MED ORDER — LACTATED RINGERS IV SOLN
INTRAVENOUS | Status: DC
  Administered 2018-09-09: 07:00:00 via INTRAVENOUS

## 2018-09-09 MED ORDER — LACTATED RINGERS IV SOLN: INTRAVENOUS | Status: DC

## 2018-09-09 MED ORDER — BUPIVACAINE HCL (PF) 0.5 % IJ SOLN
INTRAMUSCULAR | Status: AC
  Filled 2018-09-09: qty 60

## 2018-09-09 MED ORDER — SODIUM CHLORIDE 0.9 % IR SOLN
Status: DC | PRN
  Administered 2018-09-09: 6000 mL

## 2018-09-09 MED ORDER — PHENYLEPHRINE HCL (PRESSORS) 10 MG/ML IV SOLN
INTRAVENOUS | Status: DC | PRN
  Administered 2018-09-09 (×3): 80 ug via INTRAVENOUS

## 2018-09-09 MED ORDER — FENTANYL CITRATE (PF) 100 MCG/2ML IJ SOLN
INTRAMUSCULAR | Status: AC
  Filled 2018-09-09: qty 2

## 2018-09-09 MED ORDER — CHLORHEXIDINE GLUCONATE 4 % EX LIQD: 60.0000 mL | Freq: Once | CUTANEOUS | Status: DC

## 2018-09-09 MED ORDER — EPHEDRINE 5 MG/ML INJ
INTRAVENOUS | Status: AC
  Filled 2018-09-09: qty 10

## 2018-09-09 MED ORDER — FENTANYL CITRATE (PF) 100 MCG/2ML IJ SOLN: 25.0000 ug | INTRAMUSCULAR | Status: DC | PRN

## 2018-09-09 MED ORDER — OXYCODONE HCL 5 MG PO TABS
ORAL_TABLET | ORAL | Status: AC
  Filled 2018-09-09: qty 1

## 2018-09-09 MED ORDER — LIDOCAINE 2% (20 MG/ML) 5 ML SYRINGE
INTRAMUSCULAR | Status: AC
  Filled 2018-09-09: qty 5

## 2018-09-09 MED ORDER — SUGAMMADEX SODIUM 200 MG/2ML IV SOLN
INTRAVENOUS | Status: DC | PRN
  Administered 2018-09-09: 190.6 mg via INTRAVENOUS

## 2018-09-09 MED ORDER — PROPOFOL 10 MG/ML IV BOLUS
INTRAVENOUS | Status: DC | PRN
  Administered 2018-09-09: 150 mg via INTRAVENOUS

## 2018-09-09 MED ORDER — METOCLOPRAMIDE HCL 5 MG/ML IJ SOLN: 10.0000 mg | Freq: Once | INTRAMUSCULAR | Status: DC | PRN

## 2018-09-09 MED ORDER — ROCURONIUM BROMIDE 100 MG/10ML IV SOLN
INTRAVENOUS | Status: DC | PRN
  Administered 2018-09-09: 70 mg via INTRAVENOUS

## 2018-09-09 MED ORDER — SODIUM CHLORIDE 0.9 % IV SOLN
INTRAVENOUS | Status: DC | PRN
  Administered 2018-09-09: 08:00:00 25 ug/min via INTRAVENOUS

## 2018-09-09 MED ORDER — DEXAMETHASONE SODIUM PHOSPHATE 10 MG/ML IJ SOLN
INTRAMUSCULAR | Status: AC
  Filled 2018-09-09: qty 1

## 2018-09-09 MED ORDER — BUPIVACAINE LIPOSOME 1.3 % IJ SUSP
INTRAMUSCULAR | Status: DC | PRN
  Administered 2018-09-09: 10 mL via PERINEURAL

## 2018-09-09 MED ORDER — FENTANYL CITRATE (PF) 100 MCG/2ML IJ SOLN
50.0000 ug | INTRAMUSCULAR | Status: DC | PRN
  Administered 2018-09-09: 100 ug via INTRAVENOUS

## 2018-09-09 MED ORDER — ROCURONIUM BROMIDE 10 MG/ML (PF) SYRINGE
PREFILLED_SYRINGE | INTRAVENOUS | Status: AC
  Filled 2018-09-09: qty 10

## 2018-09-09 SURGICAL SUPPLY — 102 items
AID PSTN UNV HD RSTRNT DISP (MISCELLANEOUS) ×1
APL PRP STRL LF DISP 70% ISPRP (MISCELLANEOUS) ×1
BLADE EXCALIBUR 4.0MM X 13CM (MISCELLANEOUS)
BLADE EXCALIBUR 4.0X13 (MISCELLANEOUS) IMPLANT
BLADE SHAVER BONE 5.0MM X 13CM (MISCELLANEOUS)
BLADE SHAVER BONE 5.0X13 (MISCELLANEOUS) IMPLANT
BLADE SURG 10 STRL SS (BLADE) IMPLANT
BLADE SURG 15 STRL LF DISP TIS (BLADE) IMPLANT
BLADE SURG 15 STRL SS (BLADE)
BURR OVAL 8 FLU 5.0MM X 13CM (MISCELLANEOUS)
BURR OVAL 8 FLU 5.0X13 (MISCELLANEOUS) IMPLANT
CANNULA SHOULDER 7CM (CANNULA) ×3 IMPLANT
CHLORAPREP W/TINT 26 (MISCELLANEOUS) ×3 IMPLANT
CLEANER CAUTERY TIP 5X5 PAD (MISCELLANEOUS) IMPLANT
CLOSURE WOUND 1/2 X4 (GAUZE/BANDAGES/DRESSINGS) ×1
COVER WAND RF STERILE (DRAPES) IMPLANT
DECANTER SPIKE VIAL GLASS SM (MISCELLANEOUS) IMPLANT
DISSECTOR  3.8MM X 13CM (MISCELLANEOUS) ×2
DISSECTOR 3.8MM X 13CM (MISCELLANEOUS) IMPLANT
DISSECTOR 4.0MM X 13CM (MISCELLANEOUS) IMPLANT
DRAPE INCISE IOBAN 66X45 STRL (DRAPES) ×6 IMPLANT
DRAPE STERI 35X30 U-POUCH (DRAPES) ×3 IMPLANT
DRAPE U-SHAPE 47X51 STRL (DRAPES) ×12 IMPLANT
DRSG PAD ABDOMINAL 8X10 ST (GAUZE/BANDAGES/DRESSINGS) ×3 IMPLANT
DRSG TEGADERM 4X4.75 (GAUZE/BANDAGES/DRESSINGS) ×6 IMPLANT
ELECT MENISCUS 165MM 90D (ELECTRODE) IMPLANT
ELECT NDL TIP 2.8 STRL (NEEDLE) IMPLANT
ELECT NEEDLE TIP 2.8 STRL (NEEDLE) IMPLANT
ELECT REM PT RETURN 9FT ADLT (ELECTROSURGICAL) ×3
ELECTRODE REM PT RTRN 9FT ADLT (ELECTROSURGICAL) ×1 IMPLANT
EXCALIBUR 3.8MM X 13CM (MISCELLANEOUS) IMPLANT
GAUZE 4X4 16PLY RFD (DISPOSABLE) IMPLANT
GAUZE SPONGE 4X4 12PLY STRL (GAUZE/BANDAGES/DRESSINGS) ×3 IMPLANT
GAUZE XEROFORM 1X8 LF (GAUZE/BANDAGES/DRESSINGS) ×3 IMPLANT
GLOVE BIO SURGEON STRL SZ8 (GLOVE) ×2 IMPLANT
GLOVE BIOGEL PI IND STRL 7.0 (GLOVE) IMPLANT
GLOVE BIOGEL PI IND STRL 8 (GLOVE) ×1 IMPLANT
GLOVE BIOGEL PI INDICATOR 7.0 (GLOVE) ×2
GLOVE BIOGEL PI INDICATOR 8 (GLOVE) ×4
GLOVE SURG ORTHO 8.0 STRL STRW (GLOVE) ×3 IMPLANT
GLOVE SURG SS PI 7.0 STRL IVOR (GLOVE) ×2 IMPLANT
GLOVE SURG SS PI 7.5 STRL IVOR (GLOVE) ×2 IMPLANT
GLOVE SURG SS PI 8.0 STRL IVOR (GLOVE) ×2 IMPLANT
GOWN STRL REUS W/ TWL LRG LVL3 (GOWN DISPOSABLE) ×2 IMPLANT
GOWN STRL REUS W/TWL LRG LVL3 (GOWN DISPOSABLE) ×6
KIT BIO-TENODESIS 3X8 DISP (MISCELLANEOUS)
KIT INSRT BABSR STRL DISP BTN (MISCELLANEOUS) IMPLANT
MANIFOLD NEPTUNE II (INSTRUMENTS) ×3 IMPLANT
NDL SAFETY ECLIPSE 18X1.5 (NEEDLE) ×2 IMPLANT
NDL SCORPION MULTI FIRE (NEEDLE) IMPLANT
NDL SUT 6 .5 CRC .975X.05 MAYO (NEEDLE) IMPLANT
NEEDLE HYPO 18GX1.5 SHARP (NEEDLE) ×6
NEEDLE MAYO TAPER (NEEDLE)
NEEDLE SCORPION MULTI FIRE (NEEDLE) IMPLANT
NS IRRIG 1000ML POUR BTL (IV SOLUTION) IMPLANT
PACK ARTHROSCOPY DSU (CUSTOM PROCEDURE TRAY) ×3 IMPLANT
PACK BASIN DAY SURGERY FS (CUSTOM PROCEDURE TRAY) ×3 IMPLANT
PAD CLEANER CAUTERY TIP 5X5 (MISCELLANEOUS)
PENCIL BUTTON HOLSTER BLD 10FT (ELECTRODE) ×3 IMPLANT
PORT APPOLLO RF 90DEGREE MULTI (SURGICAL WAND) ×5 IMPLANT
RESTRAINT HEAD UNIVERSAL NS (MISCELLANEOUS) ×3 IMPLANT
SHEET MEDIUM DRAPE 40X70 STRL (DRAPES) IMPLANT
SLEEVE SCD COMPRESS KNEE MED (MISCELLANEOUS) ×3 IMPLANT
SLING ARM FOAM STRAP LRG (SOFTGOODS) IMPLANT
SLING ARM IMMOBILIZER LRG (SOFTGOODS) ×2 IMPLANT
SLING ARM IMMOBILIZER MED (SOFTGOODS) IMPLANT
SLING ARM MED ADULT FOAM STRAP (SOFTGOODS) IMPLANT
SLING ARM XL FOAM STRAP (SOFTGOODS) IMPLANT
SPONGE LAP 4X18 RFD (DISPOSABLE) IMPLANT
SPONGE SURGIFOAM ABS GEL 12-7 (HEMOSTASIS) IMPLANT
STAPLER VISISTAT 35W (STAPLE) IMPLANT
STRIP CLOSURE SKIN 1/2X4 (GAUZE/BANDAGES/DRESSINGS) ×2 IMPLANT
SUCTION FRAZIER HANDLE 10FR (MISCELLANEOUS) ×2
SUCTION TUBE FRAZIER 10FR DISP (MISCELLANEOUS) ×1 IMPLANT
SUT BONE WAX W31G (SUTURE) IMPLANT
SUT ETHIBOND 2 OS 4 DA (SUTURE) IMPLANT
SUT ETHILON 3 0 PS 1 (SUTURE) ×3 IMPLANT
SUT ETHILON 4 0 PS 2 18 (SUTURE) IMPLANT
SUT FIBERWIRE #2 38 T-5 BLUE (SUTURE)
SUT FIBERWIRE 2-0 18 17.9 3/8 (SUTURE)
SUT MNCRL AB 3-0 PS2 18 (SUTURE) IMPLANT
SUT PDS AB 0 CT 36 (SUTURE) IMPLANT
SUT TICRON 1 T 12 (SUTURE) IMPLANT
SUT VIC AB 0 CT1 27 (SUTURE)
SUT VIC AB 0 CT1 27XBRD ANBCTR (SUTURE) IMPLANT
SUT VIC AB 1 CT1 27 (SUTURE)
SUT VIC AB 1 CT1 27XBRD ANBCTR (SUTURE) IMPLANT
SUT VIC AB 2-0 SH 27 (SUTURE)
SUT VIC AB 2-0 SH 27XBRD (SUTURE) IMPLANT
SUT VICRYL 0 UR6 27IN ABS (SUTURE) IMPLANT
SUT VICRYL 4-0 PS2 18IN ABS (SUTURE) IMPLANT
SUTURE FIBERWR #2 38 T-5 BLUE (SUTURE) IMPLANT
SUTURE FIBERWR 2-0 18 17.9 3/8 (SUTURE) IMPLANT
SYR 5ML LL (SYRINGE) IMPLANT
SYR BULB 3OZ (MISCELLANEOUS) ×3 IMPLANT
TAPE STRIPS DRAPE STRL (GAUZE/BANDAGES/DRESSINGS) ×3 IMPLANT
TOWEL GREEN STERILE FF (TOWEL DISPOSABLE) ×3 IMPLANT
TUBE CONNECTING 20'X1/4 (TUBING) ×1
TUBE CONNECTING 20X1/4 (TUBING) ×2 IMPLANT
TUBING ARTHROSCOPY IRRIG 16FT (MISCELLANEOUS) ×3 IMPLANT
WATER STERILE IRR 1000ML POUR (IV SOLUTION) ×3 IMPLANT
YANKAUER SUCT BULB TIP NO VENT (SUCTIONS) ×3 IMPLANT

## 2018-09-09 NOTE — Anesthesia Procedure Notes (Signed)
Anesthesia Regional Block: Supraclavicular block   Pre-Anesthetic Checklist: ,, timeout performed, Correct Patient, Correct Site, Correct Laterality, Correct Procedure, Correct Position, site marked, Risks and benefits discussed,  Surgical consent,  Pre-op evaluation,  At surgeon's request and post-op pain management  Laterality: Left and Upper  Prep: Maximum Sterile Barrier Precautions used, chloraprep       Needles:  Injection technique: Single-shot  Needle Type: Echogenic Stimulator Needle     Needle Length: 10cm      Additional Needles:   Procedures:,,,, ultrasound used (permanent image in chart),,,,  Narrative:  Start time: 09/09/2018 7:16 AM End time: 09/09/2018 7:26 AM Injection made incrementally with aspirations every 5 mL.  Performed by: Personally  Anesthesiologist: Montez Hageman, MD  Additional Notes: Risks, benefits and alternative to block explained extensively.  Patient tolerated procedure well, without complications.

## 2018-09-09 NOTE — Transfer of Care (Signed)
Immediate Anesthesia Transfer of Care Note  Patient: Jacqueline Lucas  Procedure(s) Performed: LEFT SHOULDER ARTHROSCOPY, BICEPS TENOTOMY (Left Shoulder)  Patient Location: PACU  Anesthesia Type:GA combined with regional for post-op pain  Level of Consciousness: awake and patient cooperative  Airway & Oxygen Therapy: Patient Spontanous Breathing and Patient connected to face mask oxygen  Post-op Assessment: Report given to RN and Post -op Vital signs reviewed and stable  Post vital signs: Reviewed and stable  Last Vitals:  Vitals Value Taken Time  BP    Temp    Pulse 78 09/09/18 0901  Resp    SpO2 96 % 09/09/18 0901  Vitals shown include unvalidated device data.  Last Pain:  Vitals:   09/09/18 0644  TempSrc: Oral  PainSc: 3       Patients Stated Pain Goal: 3 (83/09/40 7680)  Complications: No apparent anesthesia complications

## 2018-09-09 NOTE — Progress Notes (Signed)
Assisted Dr. Marcell Barlow with left, ultrasound guided, supraclavicular block. Side rails up, monitors on throughout procedure. See vital signs in flow sheet. Tolerated Procedure well.

## 2018-09-09 NOTE — Discharge Instructions (Signed)
Post Anesthesia Home Care Instructions  Activity: Get plenty of rest for the remainder of the day. A responsible individual must stay with you for 24 hours following the procedure.  For the next 24 hours, DO NOT: -Drive a car -Operate machinery -Drink alcoholic beverages -Take any medication unless instructed by your physician -Make any legal decisions or sign important papers.  Meals: Start with liquid foods such as gelatin or soup. Progress to regular foods as tolerated. Avoid greasy, spicy, heavy foods. If nausea and/or vomiting occur, drink only clear liquids until the nausea and/or vomiting subsides. Call your physician if vomiting continues.  Special Instructions/Symptoms: Your throat may feel dry or sore from the anesthesia or the breathing tube placed in your throat during surgery. If this causes discomfort, gargle with warm salt water. The discomfort should disappear within 24 hours.  If you had a scopolamine patch placed behind your ear for the management of post- operative nausea and/or vomiting:  1. The medication in the patch is effective for 72 hours, after which it should be removed.  Wrap patch in a tissue and discard in the trash. Wash hands thoroughly with soap and water. 2. You may remove the patch earlier than 72 hours if you experience unpleasant side effects which may include dry mouth, dizziness or visual disturbances. 3. Avoid touching the patch. Wash your hands with soap and water after contact with the patch.      Regional Anesthesia Blocks  1. Numbness or the inability to move the "blocked" extremity may last from 3-48 hours after placement. The length of time depends on the medication injected and your individual response to the medication. If the numbness is not going away after 48 hours, call your surgeon.  2. The extremity that is blocked will need to be protected until the numbness is gone and the  Strength has returned. Because you cannot feel it, you  will need to take extra care to avoid injury. Because it may be weak, you may have difficulty moving it or using it. You may not know what position it is in without looking at it while the block is in effect.  3. For blocks in the legs and feet, returning to weight bearing and walking needs to be done carefully. You will need to wait until the numbness is entirely gone and the strength has returned. You should be able to move your leg and foot normally before you try and bear weight or walk. You will need someone to be with you when you first try to ensure you do not fall and possibly risk injury.  4. Bruising and tenderness at the needle site are common side effects and will resolve in a few days.  5. Persistent numbness or new problems with movement should be communicated to the surgeon or the [facility] ([phone])/ [facility] ([phone]).  Information for Discharge Teaching: EXPAREL (bupivacaine liposome injectable suspension)   Your surgeon or anesthesiologist gave you EXPAREL(bupivacaine) to help control your pain after surgery.   EXPAREL is a local anesthetic that provides pain relief by numbing the tissue around the surgical site.  EXPAREL is designed to release pain medication over time and can control pain for up to 72 hours.  Depending on how you respond to EXPAREL, you may require less pain medication during your recovery.  Possible side effects:  Temporary loss of sensation or ability to move in the area where bupivacaine was injected.  Nausea, vomiting, constipation  Rarely,   numbness and tingling in your mouth or lips, lightheadedness, or anxiety may occur.  Call your doctor right away if you think you may be experiencing any of these sensations, or if you have other questions regarding possible side effects.  Follow all other discharge instructions given to you by your surgeon or nurse. Eat a healthy diet and drink plenty of water or other  fluids.  If you return to the hospital for any reason within 96 hours following the administration of EXPAREL, it is important for health care providers to know that you have received this anesthetic. A teal colored band has been placed on your arm with the date, time and amount of EXPAREL you have received in order to alert and inform your health care providers. Please leave this armband in place for the full 96 hours following administration, and then you may remove the band. 

## 2018-09-09 NOTE — Anesthesia Postprocedure Evaluation (Signed)
Anesthesia Post Note  Patient: Jacqueline Lucas  Procedure(s) Performed: LEFT SHOULDER ARTHROSCOPY, BICEPS TENOTOMY (Left Shoulder)     Patient location during evaluation: PACU Anesthesia Type: General Level of consciousness: awake and alert Pain management: pain level controlled Vital Signs Assessment: post-procedure vital signs reviewed and stable Respiratory status: spontaneous breathing, nonlabored ventilation, respiratory function stable and patient connected to nasal cannula oxygen Cardiovascular status: blood pressure returned to baseline and stable Postop Assessment: no apparent nausea or vomiting Anesthetic complications: no    Last Vitals:  Vitals:   09/09/18 0930 09/09/18 0945  BP: (!) 135/56 117/61  Pulse: 81 75  Resp: 16 15  Temp:    SpO2: 95% 95%    Last Pain:  Vitals:   09/09/18 0945  TempSrc:   PainSc: 0-No pain                 Montez Hageman

## 2018-09-09 NOTE — H&P (Signed)
Jacqueline Lucas is an 68 y.o. female.   Chief Complaint: Left shoulder pain  HPI: Jacqueline Lucas is a patient with long history of left shoulder pain.  She is failed conservative management including injections and activity modification.  MRI scan shows superior labral pathology.  Rotator cuff generally appears intact.  She reports continued mechanical symptoms in that left shoulder.  She does live alone at home and would like to have some type of relief from the shoulder pain which does not involve extensive rehabilitation.  She does have known rotator cuff pathology on the right shoulder.  Past Medical History:  Diagnosis Date  . Allergy   . Allergy to adhesive tape    Allergy to paper tape as well  . Alzheimer disease ([place])   . Anemia   . Anginal pain ([place])   . Anxiety   . Arthritis   . Asthma   . Bronchitis   . Cataract   . Chronic nausea   . Claustrophobia   . DDD (degenerative disc disease), lumbar   . Depression   . Dry eyes    uses Restasis  . Fibromyalgia   . GERD (gastroesophageal reflux disease)   . H/O bladder infections   . Heart murmur   . Hematuria   . Hyperlipidemia   . Hypertension   . Hypothyroidism   . IBS (irritable bowel syndrome)   . Insomnia   . Lumbar neuritis   . Migraines   . Obesity   . Osteoporosis   . Palpitation   . RLS (restless legs syndrome)   . Shoulder fracture, right   . Sicca ([place])   . Tachycardia   . Thyroid disease     Past Surgical History:  Procedure Laterality Date  . ABDOMINAL HYSTERECTOMY     due to cancer of ovaries  . BACK SURGERY N/A 2014   x 5  . BACK SURGERY  12/2017  . CARDIAC CATHETERIZATION     no PCI; 12/18/12 ([facility], Dr. Marcello Moores): EP study with ablation. No coronary angiography done then.  . COLONOSCOPY W/ POLYPECTOMY    . COLONOSCOPY WITH PROPOFOL N/A 02/04/2016   Procedure: COLONOSCOPY WITH PROPOFOL;  Surgeon: Robert Bellow, MD;  Location: [facility] ENDOSCOPY;  Service: Endoscopy;  Laterality: N/A;  .  ESOPHAGOGASTRODUODENOSCOPY (EGD) WITH PROPOFOL N/A 02/04/2016   Procedure: ESOPHAGOGASTRODUODENOSCOPY (EGD) WITH PROPOFOL;  Surgeon: Robert Bellow, MD;  Location: [facility] ENDOSCOPY;  Service: Endoscopy;  Laterality: N/A;  . FINGER ARTHROSCOPY WITH CARPOMETACARPEL ([place]) ARTHROPLASTY Left 05/01/2016   Dr. Erlinda Hong  . GIVENS CAPSULE STUDY N/A 05/02/2016   Procedure: GIVENS CAPSULE STUDY;  Surgeon: Manya Silvas, MD;  Location: [facility] ENDOSCOPY;  Service: Endoscopy;  Laterality: N/A;  . HAND SURGERY    . HEMORRHOID SURGERY    . HIP ARTHROPLASTY    . JOINT REPLACEMENT Bilateral    knee  . KNEE ARTHROPLASTY    . REPLACEMENT TOTAL KNEE Left   . SPINE SURGERY     x 2  . tendonititis     right elbow, right wrist  . TOTAL HIP ARTHROPLASTY Left 2013  . TOTAL HIP ARTHROPLASTY Right 08/16/2015   Procedure: TOTAL HIP ARTHROPLASTY ANTERIOR APPROACH;  Surgeon: Meredith Pel, MD;  Location: [place];  Service: Orthopedics;  Laterality: Right;  . TOTAL KNEE ARTHROPLASTY Right 03/16/2015   Procedure: RIGHT TOTAL KNEE ARTHROPLASTY, PCL SACRIFICING;  Surgeon: Meredith Pel, MD;  Location: [place];  Service: Orthopedics;  Laterality: Right;    Family History  Problem  Relation Age of Onset  . Diabetes Mother   . Hypertension Mother   . Heart disease Mother   . Alzheimer's disease Mother   . Cancer Father   . Diabetes Father   . Hypertension Sister   . Depression Sister   . Fibromyalgia Sister   . Cancer Brother   . Heart disease Brother   . Stroke Son   . Fibromyalgia Daughter   . Bladder Cancer Neg Hx   . Kidney disease Neg Hx   . Kidney cancer Neg Hx    Social History:  reports that she has never smoked. She has never used smokeless tobacco. She reports that she does not drink alcohol or use drugs.  Allergies:  Allergies  Allergen Reactions  . Morphine Nausea And Vomiting    Ok with nausea medication  . Diazepam Nausea And Vomiting  . Lyrica [Pregabalin] Other (See Comments)    Joint  swelling  . Metformin And Related     diarrhea  . Latex Rash  . Rash Away  [Petrolatum-Zinc Oxide] Rash and Swelling  . Salicylates Other (See Comments)    Other reaction(s): Headache  . Tape Rash    Please use paper tape    Medications Prior to Admission  Medication Sig Dispense Refill  . aspirin 81 MG chewable tablet Chew 81 mg by mouth daily.    [name] atorvastatin (LIPITOR) 40 MG tablet TAKE 1 TABLET BY MOUTH EVERYDAY AT BEDTIME 90 tablet 0  . baclofen (LIORESAL) 10 MG tablet TAKE ONE TABLET AT 7AM AND ONE TABLET AT 2PM AS NEEDED FOR MUSCLE SPASM 180 tablet 1  . Cholecalciferol (D3-1000) 1000 units tablet Take 1,000 Units by mouth at bedtime.     . diclofenac sodium (VOLTAREN) 1 % GEL Apply topically.    . divalproex (DEPAKOTE) 250 MG DR tablet TAKE 1 TABLET TWICE A DAY 180 tablet 1  . DULERA 200-5 MCG/ACT AERO USE 2 INHALATIONS ORALLY   TWICE DAILY 39 g 0  . estradiol (ESTRACE) 0.1 MG/GM vaginal cream FOR DIRECTIONS ON HOW TO   TAKE THIS MEDICINE, READ   THE ENCLOSED MEDICATION    INFORMATION FORM 42.5 g 3  . gabapentin (NEURONTIN) 300 MG capsule TAKE 1 CAPSULE EVERY MORNING, TAKE 1 CAPSULE AT NOON AND TAKE 2 CAPSULES   (600MG ) IN THE EVENING 360 capsule 0  . hydrOXYzine (VISTARIL) 25 MG capsule Take 1 capsule (25 mg total) by mouth 3 (three) times daily as needed for anxiety. 30 capsule 0  . lansoprazole (PREVACID) 30 MG capsule Take 1 capsule (30 mg total) daily by mouth. 90 capsule 1  . levothyroxine (SYNTHROID, LEVOTHROID) 50 MCG tablet Take 50 mcg by mouth daily before breakfast.    . linaclotide (LINZESS) 145 MCG CAPS capsule Take 1 capsule (145 mcg total) by mouth daily before breakfast. 90 capsule 1  . magnesium oxide (MAG-OX) 400 MG tablet Take 400 mg by mouth at bedtime. Reported on 07/28/2015    . metoprolol tartrate (LOPRESSOR) 25 MG tablet TAKE 1 TABLET BY MOUTH TWICE A DAY 180 tablet 1  . montelukast (SINGULAIR) 10 MG tablet Take 1 tablet (10 mg total) by mouth at bedtime. 90  tablet 1  . olmesartan-hydrochlorothiazide (BENICAR HCT) 40-12.5 MG tablet TAKE 1 TABLET BY MOUTH EVERY DAY 90 tablet 1  . omega-3 acid ethyl esters (LOVAZA) 1 g capsule TAKE 2 CAPSULES (2 GRAMS)  DAILY 180 capsule 3  . ondansetron (ZOFRAN) 4 MG tablet Take 1 tablet (4 mg total) by mouth  every 8 (eight) hours as needed for nausea or vomiting. 20 tablet 1  . polyethylene glycol (MIRALAX / GLYCOLAX) packet Take 17 g by mouth daily as needed.    . polyethylene glycol powder (GLYCOLAX/MIRALAX) powder 255 grams one bottle for capsule endoscopy prep 255 g 0  . RESTASIS 0.05 % ophthalmic emulsion     . vitamin B-12 (CYANOCOBALAMIN) 1000 MCG tablet Take 1,000 mcg by mouth at bedtime.    . levalbuterol (XOPENEX) 1.25 MG/3ML nebulizer solution Take 1.25 mg by nebulization every 4 (four) hours as needed for wheezing. 72 mL 4  . nystatin (MYCOSTATIN) 100000 UNIT/ML suspension Take 5 mLs (500,000 Units total) by mouth 4 (four) times daily. 60 mL 0  . QUEtiapine (SEROQUEL) 25 MG tablet TAKE 1 TABLET BY MOUTH EVERYDAY AT BEDTIME 90 tablet 0  . valACYclovir (VALTREX) 1000 MG tablet Take 0.5 tablets (500 mg total) by mouth 2 (two) times daily. (Patient taking differently: Take 500 mg by mouth as needed. ) 6 tablet 0    No results found for this or any previous visit (from the past 48 hour(s)). No results found.  Review of Systems  Musculoskeletal: Positive for joint pain.  All other systems reviewed and are negative.   Temperature 98.1 F (36.7 C), temperature source Oral, height 5\' 4"  (1.626 m), weight 95.3 kg. Physical Exam  Constitutional: She appears well-developed.  HENT:  Head: Normocephalic.  Eyes: Pupils are equal, round, and reactive to light.  Neck: Normal range of motion.  Cardiovascular: Normal rate.  Respiratory: Effort normal.  Neurological: She is alert.  Skin: Skin is warm.  Psychiatric: She has a normal mood and affect.  Examination of the left shoulder demonstrates reasonable  rotator cuff strength infraspinatus supraspinatus and subscap muscle testing.  Motor or sensory function to the hand is intact.  Radial pulses intact.  No discrete AC joint tenderness is present.  No other masses lymphadenopathy or skin changes noted in the shoulder girdle region.  No restriction of passive external rotation of 15 degrees of abduction  Assessment/Plan Impression is left shoulder SLAP tear in a 68 year old patient who wants quick recovery from surgery.  Is arthroscopy with biceps tenotomy.  Risk and benefits are discussed including but not limited to infection nerve vessel damage some spasm in that arm with repeated physical activity.  Her activity level is not so great that I think that that would be a big problem.  The main thing she is interested in is pain relief and a quick recovery and so I think tenotomy is her best option particularly given the habitus of her left arm.  All questions answered  Anderson Malta, MD 09/09/2018, 7:08 AM

## 2018-09-09 NOTE — Brief Op Note (Signed)
   09/09/2018  9:11 AM  PATIENT:  Jacqueline Lucas  68 y.o. female  PRE-OPERATIVE DIAGNOSIS:  left shoulder  POST-OPERATIVE DIAGNOSIS:  left shoulder  PROCEDURE:  Procedure(s): No worries..  We may potentially be doing cases to but can hard to say since I will call today.  LEFT SHOULDER ARTHROSCOPY, BICEPS TENOTOMY  SURGEON:  Surgeon(s): Jacqueline Lucas, Tonna Corner, MD  ASSISTANT: Annie Main, PA  ANESTHESIA:   general  EBL:  ml    Total I/O In: 1500 [I.V.:1500] Out: 30 [Blood:30]  BLOOD ADMINISTERED: none  DRAINS: none   LOCAL MEDICATIONS USED:  none  SPECIMEN:  No Specimen  COUNTS:  YES  TOURNIQUET:  * No tourniquets in log *  DICTATION: .Other Dictation: Dictation Number setting pain on palpation Jacqueline Lucas  Diagnosis left shoulder SLAP tear postop diagnosis same procedure left shoulder biceps tenotomy with debridement of partial-thickness tear rotator cuff surgeon 10 is having assist with assist with magnet indications Jacqueline Lucas is a 68 year old patient with left shoulder pain of the least 2 years duration.  She has SLAP tear by MRI scanning.  Some rotator cuff tendinosis.  Presents now for operative management after failure of conservative management and explanation of risks and benefits.  Operative findings #1 examination under anesthesia range of motion patient full forward flexion abduction with external rotation of 15 degrees of abduction passively to about 70 degrees.  Patient had good shoulder stability 1+ anterior posterior instability with less than a centimeter sulcus sign.  Findings #2 diagnostic arthroscopy #1 type II SLAP tear extending from the 10:00 to 2 o'clock position with intact mid humeral articular surfaces and partial-thickness tearing of the supraspinatus over about a 5 x 5 mm area on the articular side only.  Procedure detail patient brought the operating room where general endotracheal anesthesia was  Oximetric timeout was called patient's was placed  in the beachchair position with the head in neutral position.  Left arm was prescribed alcohol Betadine   Prepped her procedure draped in a sterile manner-used to cover the operative field and a ceiling type fashion.  Saline was injected into the joint.  At this time at this time posterior portal after calling timeout posterior portal was created 2 cm medial and inferior to the posterior margin acromion anterior portal created under direct visualization diagnostic arthroscopy demonstrated some fraying of the supraspinatus which was debrided with a shaver.  This is over a small area.  Glenohumeral articular surfaces were intact.  Anterior inferior posterior.  Glenohumeral ligaments were intact.  At this time at this time VS SLAP tear was identified.  Went from the 10:00 to 2 o'clock position.  This was removed with the ArthroCare wand and the biceps tenotomy was performed.  Debridement of the superior labrum was performed.  At this time scope placed into the subacromial space and there was really no significant bursitis and no significant impingement.  Of the rotator cuff was intact from the bursal side.  The instruments were removed and portals were closed using 3-0 nylon.  Waterproof dressings applied.  Patient tolerated well by me palpitation transferred recovery in stable condition  [id number] number  PLAN OF CARE: Discharge to home after PACU  PATIENT DISPOSITION:  PACU - hemodynamically stable

## 2018-09-09 NOTE — Anesthesia Procedure Notes (Signed)
Procedure Name: Intubation Date/Time: 09/09/2018 7:57 AM Performed by: Marrianne Mood, CRNA Pre-anesthesia Checklist: Patient identified, Emergency Drugs available, Suction available, Patient being monitored and Timeout performed Patient Re-evaluated:Patient Re-evaluated prior to induction Oxygen Delivery Method: Circle system utilized Preoxygenation: Pre-oxygenation with 100% oxygen Induction Type: IV induction Ventilation: Mask ventilation without difficulty Laryngoscope Size: Miller and 3 Grade View: Grade II Tube type: Oral Tube size: 7.0 mm Number of attempts: 1 Airway Equipment and Method: Stylet and Oral airway Placement Confirmation: ETT inserted through vocal cords under direct vision,  positive ETCO2 and breath sounds checked- equal and bilateral Secured at: 21 cm Tube secured with: Tape Dental Injury: Teeth and Oropharynx as per pre-operative assessment

## 2018-09-09 NOTE — Op Note (Signed)
NAME: Jacqueline Lucas, [place] MEDICAL RECORD [id number] ACCOUNT [id number] DATE OF BIRTH:29-Sep-1950 FACILITY: [facility] LOCATION: [facility] PHYSICIAN:GREGORY Randel Pigg, MD  OPERATIVE REPORT  DATE OF PROCEDURE:  09/09/2018  PREOPERATIVE DIAGNOSIS:  Left shoulder superior labrum anterior and posterior tear.  POSTOPERATIVE DIAGNOSIS:  Left shoulder superior labrum anterior and posterior tear.  PROCEDURE:  Left shoulder biceps tenotomy with debridement of partial-thickness tear, rotator cuff.  SURGEON:  Meredith Pel, MD  ASSISTANT:  Lurena Joiner Magnet  INDICATIONS:  The patient is a 68 year old patient with left shoulder pain of at least 2 years' duration.  She has a SLAP tear by MRI scanning.  Some rotator cuff tendinosis.  Presents now for operative management after failure of conservative management  and explanation of risks and benefits.  OPERATIVE FINDINGS: 1.  Examination under anesthesia.  Range of motion:  The patient had full forward flexion and abduction with external rotation at 15 degrees of abduction passively to about 70 degrees.  The patient had good shoulder stability, 1+ anterior, posterior  instability with less than a centimeter sulcus sign. 2.  Diagnostic arthroscopy:  Type 2 SLAP tear extending from the 10 o'clock to 2 o'clock position with intact glenohumeral articular surfaces and partial thickness tearing of the supraspinatus over about a 5 x 5 mm area on the articular side only.  PROCEDURE IN DETAIL:  The patient was brought to the operating room where endotracheal anesthesia was induced.  Preoperative antibiotics administered.  Timeout was called.  The patient was placed in the beach-chair position with the head in neutral  position.  Left arm was prescrubbed with alcohol and Betadine and allowed to air dry.  Prepped with DuraPrep solution and draped in a sterile manner.  Charlie Pitter was used to cover the operative field in a ceiling-type fashion.  Saline was injected into the   joint.  At this time after calling timeout, a posterior portal was created 2 cm medial and inferior to the posterolateral margin of the acromion.  Anterior portal was created under direct visualization.  Diagnostic arthroscopy demonstrated some fraying  of that supraspinatus, which was debrided with the shaver.  This was over a small area.  The glenohumeral articular surfaces were intact.  Anterior, inferior, posterior, and inferior glenohumeral ligaments were intact.  At this time, the SLAP tear was  identified.  It went from the 10 o'clock to 2 o'clock position.  This was removed with the Arthrocare wand, and the biceps tenotomy was performed.  Debridement of the superior labrum was performed.  At this time, the scope was placed into the subacromial  space, and there was really no significant bursitis and no significant impingement.  The rotator cuff was intact from the bursal side.  Instruments were removed and portals were closed using 3-0 nylon.  Waterproof dressings applied.  The patient  tolerated the procedure well without immediate complications.  He was transferred to the recovery room in stable condition.  LN/NUANCE  D:09/09/2018 T:09/09/2018 JOB:[id number]

## 2018-09-09 NOTE — Anesthesia Preprocedure Evaluation (Signed)
Anesthesia Evaluation  Patient identified by MRN, date of birth, ID band Patient awake    Reviewed: Allergy & Precautions, NPO status , Patient's Chart, lab work & pertinent test results  Airway Mallampati: II  TM Distance: >3 FB Neck ROM: Full    Dental no notable dental hx.    Pulmonary asthma ,    Pulmonary exam normal breath sounds clear to auscultation       Cardiovascular hypertension, Pt. on medications + CAD  Normal cardiovascular exam Rhythm:Regular Rate:Normal     Neuro/Psych Anxiety Depression Dementia negative neurological ROS     GI/Hepatic negative GI ROS, Neg liver ROS,   Endo/Other  Hypothyroidism   Renal/GU negative Renal ROS  negative genitourinary   Musculoskeletal  (+) Fibromyalgia -  Abdominal   Peds negative pediatric ROS (+)  Hematology negative hematology ROS (+)   Anesthesia Other Findings   Reproductive/Obstetrics negative OB ROS                             Anesthesia Physical Anesthesia Plan  ASA: III  Anesthesia Plan: General   Post-op Pain Management:  Regional for Post-op pain   Induction: Intravenous  PONV Risk Score and Plan: 3 and Ondansetron, Midazolam and Treatment may vary due to age or medical condition  Airway Management Planned: Oral ETT  Additional Equipment:   Intra-op Plan:   Post-operative Plan: Extubation in OR  Informed Consent: I have reviewed the patients History and Physical, chart, labs and discussed the procedure including the risks, benefits and alternatives for the proposed anesthesia with the patient or authorized representative who has indicated his/her understanding and acceptance.     Dental advisory given  Plan Discussed with: CRNA  Anesthesia Plan Comments:         Anesthesia Quick Evaluation

## 2018-09-10 ENCOUNTER — Encounter (HOSPITAL_BASED_OUTPATIENT_CLINIC_OR_DEPARTMENT_OTHER): Payer: Self-pay | Admitting: Orthopedic Surgery

## 2018-09-10 MED ORDER — VENLAFAXINE HCL ER 75 MG PO CP24: 75.0000 mg | ORAL_CAPSULE | Freq: Every day | ORAL | 0 refills | Status: DC

## 2018-09-10 NOTE — Telephone Encounter (Signed)
Pt want to know if she is suppose to be taking this medication for venlafaxine XR. Please all pt

## 2018-09-10 NOTE — Addendum Note (Signed)
Addended by: [name] F on: 09/10/2018 08:37 PM   Modules accepted: Orders

## 2018-09-11 ENCOUNTER — Telehealth: Payer: Self-pay | Admitting: Radiology

## 2018-09-11 NOTE — Telephone Encounter (Signed)
Ok thx she may not need them but ok

## 2018-09-11 NOTE — Telephone Encounter (Signed)
Called patient. LVM to tell her that she is suppose to be taking Venlafaxine XR.

## 2018-09-11 NOTE — Telephone Encounter (Signed)
Per Allyson Sabal, she wanted to let Dr. Marlou Sa know that they would start services for patient on 09/15/2018

## 2018-09-11 NOTE — Telephone Encounter (Signed)
FYI

## 2018-09-12 ENCOUNTER — Ambulatory Visit: Payer: Medicare Other

## 2018-09-16 ENCOUNTER — Telehealth: Payer: Self-pay | Admitting: Orthopedic Surgery

## 2018-09-16 NOTE — Telephone Encounter (Signed)
Received call from Verdis Frederickson (PT) with [facility] advised patient refused (PT) because she had out of town guest. Verdis Frederickson is asking for a new order for start of care for 09/17/2018. The number to contact Verdis Frederickson is [phone]

## 2018-09-16 NOTE — Telephone Encounter (Signed)
IC verbal given.  

## 2018-09-17 DIAGNOSIS — K219 Gastro-esophageal reflux disease without esophagitis: Secondary | ICD-10-CM | POA: Diagnosis not present

## 2018-09-17 DIAGNOSIS — F419 Anxiety disorder, unspecified: Secondary | ICD-10-CM | POA: Diagnosis not present

## 2018-09-17 DIAGNOSIS — Z6838 Body mass index (BMI) 38.0-38.9, adult: Secondary | ICD-10-CM | POA: Diagnosis not present

## 2018-09-17 DIAGNOSIS — E669 Obesity, unspecified: Secondary | ICD-10-CM | POA: Diagnosis not present

## 2018-09-17 DIAGNOSIS — M797 Fibromyalgia: Secondary | ICD-10-CM | POA: Diagnosis not present

## 2018-09-17 DIAGNOSIS — I10 Essential (primary) hypertension: Secondary | ICD-10-CM | POA: Diagnosis not present

## 2018-09-17 DIAGNOSIS — M5136 Other intervertebral disc degeneration, lumbar region: Secondary | ICD-10-CM | POA: Diagnosis not present

## 2018-09-17 DIAGNOSIS — J45909 Unspecified asthma, uncomplicated: Secondary | ICD-10-CM | POA: Diagnosis not present

## 2018-09-17 DIAGNOSIS — E785 Hyperlipidemia, unspecified: Secondary | ICD-10-CM | POA: Diagnosis not present

## 2018-09-17 DIAGNOSIS — F329 Major depressive disorder, single episode, unspecified: Secondary | ICD-10-CM | POA: Diagnosis not present

## 2018-09-17 DIAGNOSIS — G309 Alzheimer's disease, unspecified: Secondary | ICD-10-CM | POA: Diagnosis not present

## 2018-09-17 DIAGNOSIS — S43432D Superior glenoid labrum lesion of left shoulder, subsequent encounter: Secondary | ICD-10-CM | POA: Diagnosis not present

## 2018-09-17 DIAGNOSIS — F0280 Dementia in other diseases classified elsewhere without behavioral disturbance: Secondary | ICD-10-CM | POA: Diagnosis not present

## 2018-09-17 DIAGNOSIS — M810 Age-related osteoporosis without current pathological fracture: Secondary | ICD-10-CM | POA: Diagnosis not present

## 2018-09-17 DIAGNOSIS — E039 Hypothyroidism, unspecified: Secondary | ICD-10-CM | POA: Diagnosis not present

## 2018-09-17 DIAGNOSIS — K589 Irritable bowel syndrome without diarrhea: Secondary | ICD-10-CM | POA: Diagnosis not present

## 2018-09-18 ENCOUNTER — Ambulatory Visit (INDEPENDENT_AMBULATORY_CARE_PROVIDER_SITE_OTHER): Payer: Medicare Other | Admitting: Orthopedic Surgery

## 2018-09-18 ENCOUNTER — Other Ambulatory Visit: Payer: Self-pay

## 2018-09-18 ENCOUNTER — Encounter: Payer: Self-pay | Admitting: Orthopedic Surgery

## 2018-09-18 DIAGNOSIS — S43432A Superior glenoid labrum lesion of left shoulder, initial encounter: Secondary | ICD-10-CM

## 2018-09-18 NOTE — Progress Notes (Signed)
Post-Op Visit Note   Patient: Jacqueline Lucas           Date of Birth: 01-21-1951           MRN: [id number] Visit Date: 09/18/2018 PCP: [name], MD   Assessment & Plan:  Chief Complaint:  Chief Complaint  Patient presents with  . Left Shoulder - Routine Post Op   Visit Diagnoses:  1. Superior labrum anterior-to-posterior (SLAP) tear of left shoulder     Plan: Jacqueline Lucas is a patient is now about a week out left shoulder arthroscopy with biceps tenotomy.  She is doing well.  On exam she has excellent range of motion of the shoulder.  Incisions are intact.  I deftly want to discontinue the sling at this time 4-week return.  She is going to exercises on her own.  No lifting more than 15 pounds.  Follow-Up Instructions: Return in about 4 weeks (around 10/16/2018).   Orders:  No orders of the defined types were placed in this encounter.  No orders of the defined types were placed in this encounter.   Imaging: No results found.  PMFS History: Patient Active Problem List   Diagnosis Date Noted  . Lumbar stenosis 02/04/2018  . Hx of hysterectomy 08/13/2017  . Pain in left wrist 06/12/2017  . Chronic venous insufficiency 06/09/2017  . Varicose veins of both lower extremities with pain 04/30/2017  . Claudication ([place]) 04/30/2017  . Arthritis of carpometacarpal [facility]) joint of left thumb 05/12/2016  . Bilateral thumb pain 05/01/2016  . Anemia 02/03/2016  . Status post bilateral total hip replacement 01/10/2016  . Sicca ([place]) 01/10/2016  . Age related osteoporosis 01/10/2016  . Hip arthritis 08/16/2015  . Microscopic hematuria 07/30/2015  . Vaginal atrophy 07/30/2015  . GAD (generalized anxiety disorder) 07/08/2015  . Moderate episode of recurrent major depressive disorder ([place]) 07/08/2015  . Migraine with aura and without status migrainosus, not intractable 07/08/2015  . Dyslipidemia 07/08/2015  . Degenerative arthritis of right knee 03/16/2015  . Atrophic vaginitis  01/31/2015  . Recurrent UTI 12/31/2014  . IBS (irritable bowel syndrome) 10/29/2014  . Asthma, moderate persistent 09/08/2014  . Allergic state 09/07/2014  . Colon polyp 09/07/2014  . Hemorrhoid 09/07/2014  . Constrictive tenosynovitis 03/12/2014  . H/O total knee replacement, bilateral 07/31/2013  . CAD (coronary artery disease) 11/28/2012  . Anxiety 11/28/2012  . Acid reflux 11/28/2012  . Cardiac murmur 11/28/2012  . BP (high blood pressure) 11/28/2012  . Cannot sleep 11/28/2012  . Adiposity 11/28/2012  . Restless leg 11/28/2012  . Supraventricular tachycardia ([place]) 11/28/2012  . Fibromyalgia 11/28/2012  . Tachycardia 11/28/2012   Past Medical History:  Diagnosis Date  . Allergy   . Allergy to adhesive tape    Allergy to paper tape as well  . Alzheimer disease ([place])   . Anemia   . Anginal pain ([place])   . Anxiety   . Arthritis   . Asthma   . Bronchitis   . Cataract   . Chronic nausea   . Claustrophobia   . DDD (degenerative disc disease), lumbar   . Depression   . Dry eyes    uses Restasis  . Fibromyalgia   . GERD (gastroesophageal reflux disease)   . H/O bladder infections   . Heart murmur   . Hematuria   . Hyperlipidemia   . Hypertension   . Hypothyroidism   . IBS (irritable bowel syndrome)   . Insomnia   . Lumbar neuritis   .  Migraines   . Obesity   . Osteoporosis   . Palpitation   . RLS (restless legs syndrome)   . Shoulder fracture, right   . Sicca ([place])   . Tachycardia   . Thyroid disease     Family History  Problem Relation Age of Onset  . Diabetes Mother   . Hypertension Mother   . Heart disease Mother   . Alzheimer's disease Mother   . Cancer Father   . Diabetes Father   . Hypertension Sister   . Depression Sister   . Fibromyalgia Sister   . Cancer Brother   . Heart disease Brother   . Stroke Son   . Fibromyalgia Daughter   . Bladder Cancer Neg Hx   . Kidney disease Neg Hx   . Kidney cancer Neg Hx     Past Surgical History:   Procedure Laterality Date  . ABDOMINAL HYSTERECTOMY     due to cancer of ovaries  . BACK SURGERY N/A 2014   x 5  . BACK SURGERY  12/2017  . CARDIAC CATHETERIZATION     no PCI; 12/18/12 ([facility], Dr. Marcello Moores): EP study with ablation. No coronary angiography done then.  . COLONOSCOPY W/ POLYPECTOMY    . COLONOSCOPY WITH PROPOFOL N/A 02/04/2016   Procedure: COLONOSCOPY WITH PROPOFOL;  Surgeon: Robert Bellow, MD;  [facility]COPY;  Service: Endoscopy;  Laterality: N/A;  . ESOPHAGOGASTRODUODENOSCOPY (EGD) WITH PROPOFOL N/A 02/04/2016   Procedure: ESOPHAGOGASTRODUODENOSCOPY (EGD) WITH PROPOFOL;  Surgeon: Robert Bellow, MD;  Location: [facility] ENDOSCOPY;  Service: Endoscopy;  Laterality: N/A;  . FINGER ARTHROSCOPY WITH CARPOMETACARPEL ([place]) ARTHROPLASTY Left 05/01/2016   Dr. Erlinda Hong  . GIVENS CAPSULE STUDY N/A 05/02/2016   Procedure: GIVENS CAPSULE STUDY;  Surgeon: Manya Silvas, MD;  Location[facility] ENDOSCOPY;  Service: Endoscopy;  Laterality: N/A;  . HAND SURGERY    . HEMORRHOID SURGERY    . HIP ARTHROPLASTY    . JOINT REPLACEMENT Bilateral    knee  . KNEE ARTHROPLASTY    . REPLACEMENT TOTAL KNEE Left   . SHOULDER ARTHROSCOPY Left 09/09/2018   Procedure: LEFT SHOULDER ARTHROSCOPY, BICEPS TENOTOMY;  Surgeon: Meredith Pel, MD;  Location: [place];  Service: Orthopedics;  Laterality: Left;  . SPINE SURGERY     x 2  . tendonititis     right elbow, right wrist  . TOTAL HIP ARTHROPLASTY Left 2013  . TOTAL HIP ARTHROPLASTY Right 08/16/2015   Procedure: TOTAL HIP ARTHROPLASTY ANTERIOR APPROACH;  Surgeon: Meredith Pel, MD;  Location: [place];  Service: Orthopedics;  Laterality: Right;  . TOTAL KNEE ARTHROPLASTY Right 03/16/2015   Procedure: RIGHT TOTAL KNEE ARTHROPLASTY, PCL SACRIFICING;  Surgeon: Meredith Pel, MD;  Location: [place];  Service: Orthopedics;  Laterality: Right;   Social History   Occupational History  . Occupation: Retired  Tobacco Use  .  Smoking status: Never Smoker  . Smokeless tobacco: Never Used  . Tobacco comment: smoking cessation materials not required  Substance and Sexual Activity  . Alcohol use: No    Alcohol/week: 0.0 standard drinks  . Drug use: No  . Sexual activity: Never

## 2018-09-19 ENCOUNTER — Telehealth: Payer: Self-pay | Admitting: Orthopedic Surgery

## 2018-09-19 DIAGNOSIS — I10 Essential (primary) hypertension: Secondary | ICD-10-CM | POA: Diagnosis not present

## 2018-09-19 DIAGNOSIS — M810 Age-related osteoporosis without current pathological fracture: Secondary | ICD-10-CM | POA: Diagnosis not present

## 2018-09-19 DIAGNOSIS — F0280 Dementia in other diseases classified elsewhere without behavioral disturbance: Secondary | ICD-10-CM | POA: Diagnosis not present

## 2018-09-19 DIAGNOSIS — S43432D Superior glenoid labrum lesion of left shoulder, subsequent encounter: Secondary | ICD-10-CM | POA: Diagnosis not present

## 2018-09-19 DIAGNOSIS — J45909 Unspecified asthma, uncomplicated: Secondary | ICD-10-CM | POA: Diagnosis not present

## 2018-09-19 DIAGNOSIS — G309 Alzheimer's disease, unspecified: Secondary | ICD-10-CM | POA: Diagnosis not present

## 2018-09-19 NOTE — Telephone Encounter (Signed)
Patient wants to know if she can start back sleeping in the bed?

## 2018-09-19 NOTE — Telephone Encounter (Signed)
yes

## 2018-09-19 NOTE — Telephone Encounter (Signed)
Ok with this if pain allows?

## 2018-09-19 NOTE — Telephone Encounter (Signed)
IC s/w patient and advised  

## 2018-09-24 ENCOUNTER — Ambulatory Visit (INDEPENDENT_AMBULATORY_CARE_PROVIDER_SITE_OTHER): Payer: Medicare Other

## 2018-09-24 VITALS — BP 152/73 | HR 71 | Ht 64.0 in | Wt 208.0 lb

## 2018-09-24 DIAGNOSIS — S43432D Superior glenoid labrum lesion of left shoulder, subsequent encounter: Secondary | ICD-10-CM | POA: Diagnosis not present

## 2018-09-24 DIAGNOSIS — J45909 Unspecified asthma, uncomplicated: Secondary | ICD-10-CM | POA: Diagnosis not present

## 2018-09-24 DIAGNOSIS — G309 Alzheimer's disease, unspecified: Secondary | ICD-10-CM | POA: Diagnosis not present

## 2018-09-24 DIAGNOSIS — Z0000 Encounter for general adult medical examination without abnormal findings: Secondary | ICD-10-CM | POA: Diagnosis not present

## 2018-09-24 DIAGNOSIS — M810 Age-related osteoporosis without current pathological fracture: Secondary | ICD-10-CM | POA: Diagnosis not present

## 2018-09-24 DIAGNOSIS — F0280 Dementia in other diseases classified elsewhere without behavioral disturbance: Secondary | ICD-10-CM | POA: Diagnosis not present

## 2018-09-24 DIAGNOSIS — I10 Essential (primary) hypertension: Secondary | ICD-10-CM | POA: Diagnosis not present

## 2018-09-24 NOTE — Progress Notes (Addendum)
Subjective:   Jacqueline Lucas is a 68 y.o. female who presents for Medicare Annual (Subsequent) preventive examination.  Virtual Visit via Telephone Note  I connected with Jacqueline Lucas on 09/24/18 at  4:00 PM EDT by telephone and verified that I am speaking with the correct person using two identifiers.  Medicare Annual Wellness visit completed telephonically due to Covid-19 pandemic.   Location: Patient: home Provider: office   I discussed the limitations, risks, security and privacy concerns of performing an evaluation and management service by telephone and the availability of in person appointments. The patient expressed understanding and agreed to proceed.  Some vital signs may be absent or patient reported.   Clemetine Marker, LPN   Review of Systems:    Cardiac Risk Factors include: advanced age (>69men, >24 women);hypertension;dyslipidemia;obesity (BMI >30kg/m2)     Objective:     Vitals: BP (!) 152/73 Comment: pt just completed physical therapy  Pulse 71   Ht 5\' 4"  (1.626 m)   Wt 208 lb (94.3 kg)   BMI 35.70 kg/m   Body mass index is 35.7 kg/m.  Advanced Directives 09/24/2018 09/09/2018 09/03/2018 01/05/2018 09/11/2017 08/17/2017 06/19/2017  Does Patient Have a Medical Advance Directive? No No No No No No Yes  Does patient want to make changes to medical advance directive? - - - - - - Yes (MAU/Ambulatory/Procedural Areas - Information given)  Would patient like information on creating a medical advance directive? No - Patient declined No - Patient declined No - Patient declined - No - Patient declined Yes (MAU/Ambulatory/Procedural Areas - Information given) -    Tobacco Social History   Tobacco Use  Smoking Status Never Smoker  Smokeless Tobacco Never Used  Tobacco Comment   smoking cessation materials not required     Counseling given: Not Answered Comment: smoking cessation materials not required   Clinical Intake:  Pre-visit preparation completed:  Yes  Pain : No/denies pain     BMI - recorded: 35.7 Nutritional Status: BMI > 30  Obese Nutritional Risks: Nausea/ vomitting/ diarrhea(nausea with migraines occasionally) Diabetes: No  How often do you need to have someone help you when you read instructions, pamphlets, or other written materials from your doctor or pharmacy?: 1 - Never  Interpreter Needed?: No  Information entered by :: Clemetine Marker LPN  Past Medical History:  Diagnosis Date  . Allergy   . Allergy to adhesive tape    Allergy to paper tape as well  . Alzheimer disease ([place])   . Anemia   . Anginal pain ([place])   . Anxiety   . Arthritis   . Asthma   . Bronchitis   . Cataract   . Chronic nausea   . Claustrophobia   . DDD (degenerative disc disease), lumbar   . Depression   . Dry eyes    uses Restasis  . Fibromyalgia   . GERD (gastroesophageal reflux disease)   . H/O bladder infections   . Heart murmur   . Hematuria   . Hyperlipidemia   . Hypertension   . Hypothyroidism   . IBS (irritable bowel syndrome)   . Insomnia   . Lumbar neuritis   . Migraines   . Obesity   . Osteoporosis   . Palpitation   . RLS (restless legs syndrome)   . Shoulder fracture, right   . Sicca ([place])   . Tachycardia   . Thyroid disease    Past Surgical History:  Procedure Laterality Date  . ABDOMINAL HYSTERECTOMY  due to cancer of ovaries  . BACK SURGERY N/A 2014   x 5  . BACK SURGERY  12/2017  . CARDIAC CATHETERIZATION     no PCI; 12/18/12 ([facility], Dr. Marcello Moores): EP study with ablation. No coronary angiography done then.  . COLONOSCOPY W/ POLYPECTOMY    . COLONOSCOPY WITH PROPOFOL N/A 02/04/2016   Procedure: COLONOSCOPY WITH PROPOFOL;  Surgeon: Robert Bellow, MD;  Location: [facility] ENDOSCOPY;  Service: Endoscopy;  Laterality: N/A;  . ESOPHAGOGASTRODUODENOSCOPY (EGD) WITH PROPOFOL N/A 02/04/2016   Procedure: ESOPHAGOGASTRODUODENOSCOPY (EGD) WITH PROPOFOL;  Surgeon: Robert Bellow, MD;  Location: [facility] ENDOSCOPY;   Service: Endoscopy;  Laterality: N/A;  . FINGER ARTHROSCOPY WITH CARPOMETACARPEL ([place]) ARTHROPLASTY Left 05/01/2016   Dr. Erlinda Hong  . GIVENS CAPSULE STUDY N/A 05/02/2016   Procedure: GIVENS CAPSULE STUDY;  Surgeon: Manya Silvas, MD;  Location: [facility] ENDOSCOPY;  Service: Endoscopy;  Laterality: N/A;  . HAND SURGERY    . HEMORRHOID SURGERY    . HIP ARTHROPLASTY    . JOINT REPLACEMENT Bilateral    knee  . KNEE ARTHROPLASTY    . REPLACEMENT TOTAL KNEE Left   . SHOULDER ARTHROSCOPY Left 09/09/2018   Procedure: LEFT SHOULDER ARTHROSCOPY, BICEPS TENOTOMY;  Surgeon: Meredith Pel, MD;  Location: [place];  Service: Orthopedics;  Laterality: Left;  . SPINE SURGERY     x 2  . tendonititis     right elbow, right wrist  . TOTAL HIP ARTHROPLASTY Left 2013  . TOTAL HIP ARTHROPLASTY Right 08/16/2015   Procedure: TOTAL HIP ARTHROPLASTY ANTERIOR APPROACH;  Surgeon: Meredith Pel, MD;  Location: [place];  Service: Orthopedics;  Laterality: Right;  . TOTAL KNEE ARTHROPLASTY Right 03/16/2015   Procedure: RIGHT TOTAL KNEE ARTHROPLASTY, PCL SACRIFICING;  Surgeon: Meredith Pel, MD;  Location: [place];  Service: Orthopedics;  Laterality: Right;   Family History  Problem Relation Age of Onset  . Diabetes Mother   . Hypertension Mother   . Heart disease Mother   . Alzheimer's disease Mother   . Cancer Father   . Diabetes Father   . Hypertension Sister   . Depression Sister   . Fibromyalgia Sister   . Cancer Brother   . Heart disease Brother   . Stroke Son   . Fibromyalgia Daughter   . Bladder Cancer Neg Hx   . Kidney disease Neg Hx   . Kidney cancer Neg Hx    Social History   Socioeconomic History  . Marital status: Widowed    Spouse name: Jacqueline Lucas  . Number of children: 4  . Years of education: some college  . Highest education level: 12th grade  Occupational History  . Occupation: Retired  Scientific laboratory technician  . Financial resource strain: Not hard at all  . Food insecurity     Worry: Never true    Inability: Never true  . Transportation needs    Medical: No    Non-medical: No  Tobacco Use  . Smoking status: Never Smoker  . Smokeless tobacco: Never Used  . Tobacco comment: smoking cessation materials not required  Substance and Sexual Activity  . Alcohol use: No    Alcohol/week: 0.0 standard drinks  . Drug use: No  . Sexual activity: Never  Lifestyle  . Physical activity    Days per week: 3 days    Minutes per session: 30 min  . Stress: To some extent  Relationships  . Social connections    Talks on phone: More than three  times a week    Gets together: Twice a week    Attends religious service: 1 to 4 times per year    Active member of club or organization: No    Attends meetings of clubs or organizations: Never    Relationship status: Married  Other Topics Concern  . Not on file  Social History Narrative   Patient has 10 grandchildren, 4 great-granddaughters & 2 great-grandsons.   Widow since 02/2018 - married for 42 years    Outpatient Encounter Medications as of 09/24/2018  Medication Sig  . aspirin 81 MG chewable tablet Chew 81 mg by mouth daily.  [name] atorvastatin (LIPITOR) 40 MG tablet TAKE 1 TABLET BY MOUTH EVERYDAY AT BEDTIME  . Cholecalciferol (D3-1000) 1000 units tablet Take 1,000 Units by mouth at bedtime.   . diclofenac sodium (VOLTAREN) 1 % GEL Apply topically.  . divalproex (DEPAKOTE) 250 MG DR tablet TAKE 1 TABLET TWICE A DAY  . DULERA 200-5 MCG/ACT AERO USE 2 INHALATIONS ORALLY   TWICE DAILY  . estradiol (ESTRACE) 0.1 MG/GM vaginal cream FOR DIRECTIONS ON HOW TO   TAKE THIS MEDICINE, READ   THE ENCLOSED MEDICATION    INFORMATION FORM  . gabapentin (NEURONTIN) 300 MG capsule TAKE 1 CAPSULE EVERY MORNING, TAKE 1 CAPSULE AT NOON AND TAKE 2 CAPSULES   (600MG ) IN THE EVENING  . hydrOXYzine (VISTARIL) 25 MG capsule Take 1 capsule (25 mg total) by mouth 3 (three) times daily as needed for anxiety.  [name] levalbuterol (XOPENEX) 1.25 MG/3ML  nebulizer solution Take 1.25 mg by nebulization every 4 (four) hours as needed for wheezing.  [name] levothyroxine (SYNTHROID, LEVOTHROID) 50 MCG tablet Take 50 mcg by mouth daily before breakfast.  . linaclotide (LINZESS) 145 MCG CAPS capsule Take 1 capsule (145 mcg total) by mouth daily before breakfast.  . magnesium oxide (MAG-OX) 400 MG tablet Take 400 mg by mouth at bedtime. Reported on 07/28/2015  . metoprolol tartrate (LOPRESSOR) 25 MG tablet TAKE 1 TABLET BY MOUTH TWICE A DAY  . montelukast (SINGULAIR) 10 MG tablet Take 1 tablet (10 mg total) by mouth at bedtime.  [name] olmesartan-hydrochlorothiazide (BENICAR HCT) 40-12.5 MG tablet TAKE 1 TABLET BY MOUTH EVERY DAY  . omega-3 acid ethyl esters (LOVAZA) 1 g capsule TAKE 2 CAPSULES (2 GRAMS)  DAILY  . ondansetron (ZOFRAN) 4 MG tablet Take 1 tablet (4 mg total) by mouth every 8 (eight) hours as needed for nausea or vomiting.  . polyethylene glycol (MIRALAX / GLYCOLAX) packet Take 17 g by mouth daily as needed.  . RESTASIS 0.05 % ophthalmic emulsion   . venlafaxine XR (EFFEXOR XR) 75 MG 24 hr capsule Take 1 capsule (75 mg total) by mouth daily with breakfast.  . vitamin B-12 (CYANOCOBALAMIN) 1000 MCG tablet Take 1,000 mcg by mouth at bedtime.  . lansoprazole (PREVACID) 30 MG capsule Take 1 capsule (30 mg total) daily by mouth.  . methocarbamol (ROBAXIN) 500 MG tablet TAKE ONE TABLET EVERY 8 HOURS AS NEEDED  . oxyCODONE (OXY IR/ROXICODONE) 5 MG immediate release tablet Take 5 mg by mouth every 8 (eight) hours as needed.  . [DISCONTINUED] albuterol (PROAIR HFA) 108 (90 BASE) MCG/ACT inhaler Inhale 2 puffs into the lungs every 6 (six) hours as needed for wheezing or shortness of breath.  . [DISCONTINUED] nystatin (MYCOSTATIN) 100000 UNIT/ML suspension Take 5 mLs (500,000 Units total) by mouth 4 (four) times daily.  . [DISCONTINUED] polyethylene glycol powder (GLYCOLAX/MIRALAX) powder 255 grams one bottle for capsule endoscopy prep  . [  DISCONTINUED]  QUEtiapine (SEROQUEL) 25 MG tablet TAKE 1 TABLET BY MOUTH EVERYDAY AT BEDTIME  . [DISCONTINUED] valACYclovir (VALTREX) 1000 MG tablet Take 0.5 tablets (500 mg total) by mouth 2 (two) times daily. (Patient taking differently: Take 500 mg by mouth as needed. )   No facility-administered encounter medications on file as of 09/24/2018.     Activities of Daily Living In your present state of health, do you have any difficulty performing the following activities: 09/24/2018 09/09/2018  Hearing? N N  Vision? Y N  Difficulty concentrating or making decisions? N N  Walking or climbing stairs? Y N  Dressing or bathing? N N  Doing errands, shopping? N -  Preparing Food and eating ? N -  Using the Toilet? N -  In the past six months, have you accidently leaked urine? N -  Do you have problems with loss of bowel control? N -  Managing your Medications? N -  Managing your Finances? N -  Housekeeping or managing your Housekeeping? N -  Some recent data might be hidden    Patient Care Team: [name], MD as PCP - General (Family Medicine) Byrnett, Forest Gleason, MD (General Surgery) Bo Merino, MD as Consulting Physician (Rheumatology) Providence Lanius, MD as Referring Physician (Neurology) Yolonda Kida, MD as Consulting Physician (Cardiology) Laneta Simmers as Physician Assistant (Urology) Vladimir Crofts, MD as Consulting Physician (Neurology) Marlou Sa, Tonna Corner, MD as Consulting Physician (Orthopedic Surgery) Clyde Canterbury, MD as Consulting Physician (Otolaryngology) Gabriel Carina Betsey Holiday, MD as Physician Assistant (Endocrinology)    Assessment:   This is a routine wellness examination for Donnielle.  Exercise Activities and Dietary recommendations Current Exercise Habits: Home exercise routine, Type of exercise: calisthenics, Time (Minutes): 30, Frequency (Times/Week): 3, Weekly Exercise (Minutes/Week): 90, Intensity: Mild, Exercise limited by: orthopedic condition(s)  Goals     . DIET - Make healthy food choices     Recommend to decrease portion sizes by eating 3 small healthy meals and at least 2 healthy snacks per day.       Fall Risk Fall Risk  09/24/2018 07/26/2018 07/11/2018 04/16/2018 01/21/2018  Falls in the past year? 0 0 0 0 0  Number falls in past yr: 0 0 0 0 -  Injury with Fall? 0 0 0 0 -  Risk Factor Category  - - - - -  Risk for fall due to : - - - - -  Risk for fall due to: Comment - - - - -  Follow up Falls prevention discussed - Falls evaluation completed - -   FALL RISK PREVENTION PERTAINING TO THE HOME:  Any stairs in or around the home? No  If so, do they handrails? No   Home free of loose throw rugs in walkways, pet beds, electrical cords, etc? Yes  Adequate lighting in your home to reduce risk of falls? Yes   ASSISTIVE DEVICES UTILIZED TO PREVENT FALLS:  Life alert? No  Use of a cane, walker or w/c? Yes  Grab bars in the bathroom? Yes  Shower chair or bench in shower? Yes  Elevated toilet seat or a handicapped toilet? Yes   DME ORDERS:  DME order needed?  No   TIMED UP AND GO:  Was the test performed? No . Telephonic visit.   Education: Fall risk prevention has been discussed.  Intervention(s) required? No    Depression Screen PHQ 2/9 Scores 09/24/2018 07/26/2018 07/11/2018 06/26/2018  PHQ - 2 Score 2 2 2  2  PHQ- 9 Score 3 4 9 7      Cognitive Function - pt declined 6CIT for 2020 AWV     6CIT Screen 08/17/2017  What Year? 0 points  What month? 0 points  What time? 0 points  Count back from 20 0 points  Months in reverse 4 points  Repeat phrase 10 points  Total Score 14    Immunization History  Administered Date(s) Administered  . Influenza, High Dose Seasonal PF 02/15/2015, 11/10/2015, 01/11/2017, 12/26/2017  . Influenza-Unspecified 02/25/2014, 12/31/2017  . Pneumococcal Conjugate-13 06/03/2013  . Pneumococcal Polysaccharide-23 09/05/2011, 01/11/2017  . Tdap 09/05/2011  . Zoster 12/12/2010    Qualifies for  Shingles Vaccine? Yes  Zostavax completed 2012. Due for Shingrix. Education has been provided regarding the importance of this vaccine. Pt has been advised to call insurance company to determine out of pocket expense. Advised may also receive vaccine at local pharmacy or Health Dept. Verbalized acceptance and understanding.  Tdap: Up to date   Flu Vaccine: Up to date  Pneumococcal Vaccine: Up to date    Screening Tests Health Maintenance  Topic Date Due  . MAMMOGRAM  09/26/2018  . INFLUENZA VACCINE  09/28/2018  . TETANUS/TDAP  09/04/2021  . COLONOSCOPY  02/03/2026  . DEXA SCAN  Completed  . Hepatitis C Screening  Completed  . PNA vac Low Risk Adult  Completed   Cancer Screenings:  Colorectal Screening: Completed 02/04/16. Repeat every 10 years;   Mammogram: Completed 09/25/17. Repeat every year; Ordered 08/19/18. Pt provided with contact information and advised to call to schedule appt.   Bone Density: Completed 09/25/17. Results reflect NORMAL. Repeat every 2 years.   Lung Cancer Screening: (Low Dose CT Chest recommended if Age 32-80 years, 30 pack-year currently smoking OR have quit w/in 15years.) does not qualify.    Additional Screening:  Hepatitis C Screening: does qualify; Completed 04/23/12  Vision Screening: Recommended annual ophthalmology exams for early detection of glaucoma and other disorders of the eye. Is the patient up to date with their annual eye exam?  Yes  Who is the provider or what is the name of the office in which the pt attends annual eye exams? [place] Screening: Recommended annual dental exams for proper oral hygiene  Community Resource Referral:  CRR required this visit?  No      Plan:     I have personally reviewed and addressed the Medicare Annual Wellness questionnaire and have noted the following in the patient's chart:  A. Medical and social history B. Use of alcohol, tobacco or illicit drugs  C. Current medications  and supplements D. Functional ability and status E.  Nutritional status F.  Physical activity G. Advance directives H. List of other physicians I.  Hospitalizations, surgeries, and ER visits in previous 12 months J.  [place] such as hearing and vision if needed, cognitive and depression L. Referrals and appointments   In addition, I have reviewed and discussed with patient certain preventive protocols, quality metrics, and best practice recommendations. A written personalized care plan for preventive services as well as general preventive health recommendations were provided to patient.   Signed,  Clemetine Marker, LPN Nurse Health Advisor   Nurse Notes: pt still grieving the loss of her husband in January, she declined my offer for grief counseling and stated she just knows it will take time and her children are taking very good care of her.

## 2018-09-24 NOTE — Patient Instructions (Signed)
Jacqueline Lucas , Thank you for taking time to come for your Medicare Wellness Visit. I appreciate your ongoing commitment to your health goals. Please review the following plan we discussed and let me know if I can assist you in the future.   Screening recommendations/referrals: Colonoscopy: done 02/04/16 Mammogram: done 09/25/17. Please call [phone] to schedule your mammogram.  Bone Density: done 09/25/17 Recommended yearly ophthalmology/optometry visit for glaucoma screening and checkup Recommended yearly dental visit for hygiene and checkup  Vaccinations: Influenza vaccine: done 12/31/17 Pneumococcal vaccine: done 01/11/17 Tdap vaccine: done 09/05/11 Shingles vaccine: Shingrix discussed. Please contact your pharmacy for coverage information.   Advanced directives: Advance directive discussed with you today. Even though you declined this today please call our office should you change your mind and we can give you the proper paperwork for you to fill out.  Conditions/risks identified: Recommend maintain goals for healthy eating and physical therapy  Next appointment: Please follow up in one year for your Medicare Annual Wellness visit.     Preventive Care 20 Years and Older, Female Preventive care refers to lifestyle choices and visits with your health care provider that can promote health and wellness. What does preventive care include?  A yearly physical exam. This is also called an annual well check.  Dental exams once or twice a year.  Routine eye exams. Ask your health care provider how often you should have your eyes checked.  Personal lifestyle choices, including:  Daily care of your teeth and gums.  Regular physical activity.  Eating a healthy diet.  Avoiding tobacco and drug use.  Limiting alcohol use.  Practicing safe sex.  Taking low-dose aspirin every day.  Taking vitamin and mineral supplements as recommended by your health care provider. What happens  during an annual well check? The services and screenings done by your health care provider during your annual well check will depend on your age, overall health, lifestyle risk factors, and family history of disease. Counseling  Your health care provider may ask you questions about your:  Alcohol use.  Tobacco use.  Drug use.  Emotional well-being.  Home and relationship well-being.  Sexual activity.  Eating habits.  History of falls.  Memory and ability to understand (cognition).  Work and work Statistician.  Reproductive health. Screening  You may have the following tests or measurements:  Height, weight, and BMI.  Blood pressure.  Lipid and cholesterol levels. These may be checked every 5 years, or more frequently if you are over 51 years old.  Skin check.  Lung cancer screening. You may have this screening every year starting at age 15 if you have a 30-pack-year history of smoking and currently smoke or have quit within the past 15 years.  Fecal occult blood test (FOBT) of the stool. You may have this test every year starting at age 30.  Flexible sigmoidoscopy or colonoscopy. You may have a sigmoidoscopy every 5 years or a colonoscopy every 10 years starting at age 87.  Hepatitis C blood test.  Hepatitis B blood test.  Sexually transmitted disease (STD) testing.  Diabetes screening. This is done by checking your blood sugar (glucose) after you have not eaten for a while (fasting). You may have this done every 1-3 years.  Bone density scan. This is done to screen for osteoporosis. You may have this done starting at age 81.  Mammogram. This may be done every 1-2 years. Talk to your health care provider about how often you should have regular mammograms.  Talk with your health care provider about your test results, treatment options, and if necessary, the need for more tests. Vaccines  Your health care provider may recommend certain vaccines, such as:   Influenza vaccine. This is recommended every year.  Tetanus, diphtheria, and acellular pertussis (Tdap, Td) vaccine. You may need a Td booster every 10 years.  Zoster vaccine. You may need this after age 25.  Pneumococcal 13-valent conjugate (PCV13) vaccine. One dose is recommended after age 79.  Pneumococcal polysaccharide (PPSV23) vaccine. One dose is recommended after age 66. Talk to your health care provider about which screenings and vaccines you need and how often you need them. This information is not intended to replace advice given to you by your health care provider. Make sure you discuss any questions you have with your health care provider. Document Released: 03/12/2015 Document Revised: 11/03/2015 Document Reviewed: 12/15/2014 Elsevier Interactive Patient Education  2017 [place] Prevention in the Home Falls can cause injuries. They can happen to people of all ages. There are many things you can do to make your home safe and to help prevent falls. What can I do on the outside of my home?  Regularly fix the edges of walkways and driveways and fix any cracks.  Remove anything that might make you trip as you walk through a door, such as a raised step or threshold.  Trim any bushes or trees on the path to your home.  Use bright outdoor lighting.  Clear any walking paths of anything that might make someone trip, such as rocks or tools.  Regularly check to see if handrails are loose or broken. Make sure that both sides of any steps have handrails.  Any raised decks and porches should have guardrails on the edges.  Have any leaves, snow, or ice cleared regularly.  Use sand or salt on walking paths during winter.  Clean up any spills in your garage right away. This includes oil or grease spills. What can I do in the bathroom?  Use night lights.  Install grab bars by the toilet and in the tub and shower. Do not use towel bars as grab bars.  Use non-skid mats  or decals in the tub or shower.  If you need to sit down in the shower, use a plastic, non-slip stool.  Keep the floor dry. Clean up any water that spills on the floor as soon as it happens.  Remove soap buildup in the tub or shower regularly.  Attach bath mats securely with double-sided non-slip rug tape.  Do not have throw rugs and other things on the floor that can make you trip. What can I do in the bedroom?  Use night lights.  Make sure that you have a light by your bed that is easy to reach.  Do not use any sheets or blankets that are too big for your bed. They should not hang down onto the floor.  Have a firm chair that has side arms. You can use this for support while you get dressed.  Do not have throw rugs and other things on the floor that can make you trip. What can I do in the kitchen?  Clean up any spills right away.  Avoid walking on wet floors.  Keep items that you use a lot in easy-to-reach places.  If you need to reach something above you, use a strong step stool that has a grab bar.  Keep electrical cords out of the way.  Do not use floor polish or wax that makes floors slippery. If you must use wax, use non-skid floor wax.  Do not have throw rugs and other things on the floor that can make you trip. What can I do with my stairs?  Do not leave any items on the stairs.  Make sure that there are handrails on both sides of the stairs and use them. Fix handrails that are broken or loose. Make sure that handrails are as long as the stairways.  Check any carpeting to make sure that it is firmly attached to the stairs. Fix any carpet that is loose or worn.  Avoid having throw rugs at the top or bottom of the stairs. If you do have throw rugs, attach them to the floor with carpet tape.  Make sure that you have a light switch at the top of the stairs and the bottom of the stairs. If you do not have them, ask someone to add them for you. What else can I do to  help prevent falls?  Wear shoes that:  Do not have high heels.  Have rubber bottoms.  Are comfortable and fit you well.  Are closed at the toe. Do not wear sandals.  If you use a stepladder:  Make sure that it is fully opened. Do not climb a closed stepladder.  Make sure that both sides of the stepladder are locked into place.  Ask someone to hold it for you, if possible.  Clearly mark and make sure that you can see:  Any grab bars or handrails.  First and last steps.  Where the edge of each step is.  Use tools that help you move around (mobility aids) if they are needed. These include:  Canes.  Walkers.  Scooters.  Crutches.  Turn on the lights when you go into a dark area. Replace any light bulbs as soon as they burn out.  Set up your furniture so you have a clear path. Avoid moving your furniture around.  If any of your floors are uneven, fix them.  If there are any pets around you, be aware of where they are.  Review your medicines with your doctor. Some medicines can make you feel dizzy. This can increase your chance of falling. Ask your doctor what other things that you can do to help prevent falls. This information is not intended to replace advice given to you by your health care provider. Make sure you discuss any questions you have with your health care provider. Document Released: 12/10/2008 Document Revised: 07/22/2015 Document Reviewed: 03/20/2014 Elsevier Interactive Patient Education  2017 [employer].

## 2018-09-26 DIAGNOSIS — I10 Essential (primary) hypertension: Secondary | ICD-10-CM | POA: Diagnosis not present

## 2018-09-26 DIAGNOSIS — S43432A Superior glenoid labrum lesion of left shoulder, initial encounter: Secondary | ICD-10-CM

## 2018-09-26 DIAGNOSIS — G309 Alzheimer's disease, unspecified: Secondary | ICD-10-CM | POA: Diagnosis not present

## 2018-09-26 DIAGNOSIS — M810 Age-related osteoporosis without current pathological fracture: Secondary | ICD-10-CM | POA: Diagnosis not present

## 2018-09-26 DIAGNOSIS — S43432D Superior glenoid labrum lesion of left shoulder, subsequent encounter: Secondary | ICD-10-CM | POA: Diagnosis not present

## 2018-09-26 DIAGNOSIS — F0280 Dementia in other diseases classified elsewhere without behavioral disturbance: Secondary | ICD-10-CM | POA: Diagnosis not present

## 2018-09-26 DIAGNOSIS — J45909 Unspecified asthma, uncomplicated: Secondary | ICD-10-CM | POA: Diagnosis not present

## 2018-10-01 DIAGNOSIS — F0280 Dementia in other diseases classified elsewhere without behavioral disturbance: Secondary | ICD-10-CM | POA: Diagnosis not present

## 2018-10-01 DIAGNOSIS — S43432D Superior glenoid labrum lesion of left shoulder, subsequent encounter: Secondary | ICD-10-CM | POA: Diagnosis not present

## 2018-10-01 DIAGNOSIS — M810 Age-related osteoporosis without current pathological fracture: Secondary | ICD-10-CM | POA: Diagnosis not present

## 2018-10-01 DIAGNOSIS — J45909 Unspecified asthma, uncomplicated: Secondary | ICD-10-CM | POA: Diagnosis not present

## 2018-10-01 DIAGNOSIS — G309 Alzheimer's disease, unspecified: Secondary | ICD-10-CM | POA: Diagnosis not present

## 2018-10-01 DIAGNOSIS — I10 Essential (primary) hypertension: Secondary | ICD-10-CM | POA: Diagnosis not present

## 2018-10-09 DIAGNOSIS — J45909 Unspecified asthma, uncomplicated: Secondary | ICD-10-CM | POA: Diagnosis not present

## 2018-10-09 DIAGNOSIS — M810 Age-related osteoporosis without current pathological fracture: Secondary | ICD-10-CM | POA: Diagnosis not present

## 2018-10-09 DIAGNOSIS — G309 Alzheimer's disease, unspecified: Secondary | ICD-10-CM | POA: Diagnosis not present

## 2018-10-09 DIAGNOSIS — F0280 Dementia in other diseases classified elsewhere without behavioral disturbance: Secondary | ICD-10-CM | POA: Diagnosis not present

## 2018-10-09 DIAGNOSIS — S43432D Superior glenoid labrum lesion of left shoulder, subsequent encounter: Secondary | ICD-10-CM | POA: Diagnosis not present

## 2018-10-09 DIAGNOSIS — I10 Essential (primary) hypertension: Secondary | ICD-10-CM | POA: Diagnosis not present

## 2018-10-16 ENCOUNTER — Encounter: Payer: Self-pay | Admitting: Orthopedic Surgery

## 2018-10-16 ENCOUNTER — Ambulatory Visit (INDEPENDENT_AMBULATORY_CARE_PROVIDER_SITE_OTHER): Payer: Medicare Other | Admitting: Orthopedic Surgery

## 2018-10-16 DIAGNOSIS — S43432A Superior glenoid labrum lesion of left shoulder, initial encounter: Secondary | ICD-10-CM

## 2018-10-16 NOTE — Progress Notes (Signed)
Post-Op Visit Note   Patient: Jacqueline Lucas           Date of Birth: 1951-02-18           MRN: [id number] Visit Date: 10/16/2018 PCP: [name], MD   Assessment & Plan:  Chief Complaint:  Chief Complaint  Patient presents with  . Left Shoulder - Routine Post Op   Visit Diagnoses:  1. Superior labrum anterior-to-posterior (SLAP) tear of left shoulder     Plan: Jacqueline Lucas is a patient is now about 4 weeks out left shoulder biceps tenotomy.  Doing reasonably well.  Had partial-thickness rotator cuff tearing at the time.  Has known right shoulder small rotator cuff tear.  Does not want to have a really any intervention on the right shoulder.  On exam the left shoulder has good range of motion and good strength.  A little bit of clicking with internal X rotation 90 degrees abduction which might be related to rotator cuff issues.  Plan at this time is follow-up as needed.  Continue with shoulder motion exercises and strengthening.  No further intervention required on any or either shoulder at this time.  Follow-Up Instructions: Return if symptoms worsen or fail to improve.   Orders:  No orders of the defined types were placed in this encounter.  No orders of the defined types were placed in this encounter.   Imaging: No results found.  PMFS History: Patient Active Problem List   Diagnosis Date Noted  . Superior glenoid labrum lesion of left shoulder   . Lumbar stenosis 02/04/2018  . Hx of hysterectomy 08/13/2017  . Pain in left wrist 06/12/2017  . Chronic venous insufficiency 06/09/2017  . Varicose veins of both lower extremities with pain 04/30/2017  . Claudication ([place]) 04/30/2017  . Arthritis of carpometacarpal [facility]) joint of left thumb 05/12/2016  . Bilateral thumb pain 05/01/2016  . Anemia 02/03/2016  . Status post bilateral total hip replacement 01/10/2016  . Sicca ([place]) 01/10/2016  . Age related osteoporosis 01/10/2016  . Hip arthritis 08/16/2015  .  Microscopic hematuria 07/30/2015  . Vaginal atrophy 07/30/2015  . GAD (generalized anxiety disorder) 07/08/2015  . Moderate episode of recurrent major depressive disorder ([place]) 07/08/2015  . Migraine with aura and without status migrainosus, not intractable 07/08/2015  . Dyslipidemia 07/08/2015  . Degenerative arthritis of right knee 03/16/2015  . Atrophic vaginitis 01/31/2015  . Recurrent UTI 12/31/2014  . IBS (irritable bowel syndrome) 10/29/2014  . Asthma, moderate persistent 09/08/2014  . Allergic state 09/07/2014  . Colon polyp 09/07/2014  . Hemorrhoid 09/07/2014  . Constrictive tenosynovitis 03/12/2014  . H/O total knee replacement, bilateral 07/31/2013  . CAD (coronary artery disease) 11/28/2012  . Anxiety 11/28/2012  . Acid reflux 11/28/2012  . Cardiac murmur 11/28/2012  . BP (high blood pressure) 11/28/2012  . Cannot sleep 11/28/2012  . Adiposity 11/28/2012  . Restless leg 11/28/2012  . Supraventricular tachycardia ([place]) 11/28/2012  . Fibromyalgia 11/28/2012  . Tachycardia 11/28/2012   Past Medical History:  Diagnosis Date  . Allergy   . Allergy to adhesive tape    Allergy to paper tape as well  . Alzheimer disease ([place])   . Anemia   . Anginal pain ([place])   . Anxiety   . Arthritis   . Asthma   . Bronchitis   . Cataract   . Chronic nausea   . Claustrophobia   . DDD (degenerative disc disease), lumbar   . Depression   . Dry eyes  uses Restasis  . Fibromyalgia   . GERD (gastroesophageal reflux disease)   . H/O bladder infections   . Heart murmur   . Hematuria   . Hyperlipidemia   . Hypertension   . Hypothyroidism   . IBS (irritable bowel syndrome)   . Insomnia   . Lumbar neuritis   . Migraines   . Obesity   . Osteoporosis   . Palpitation   . RLS (restless legs syndrome)   . Shoulder fracture, right   . Sicca ([place])   . Tachycardia   . Thyroid disease     Family History  Problem Relation Age of Onset  . Diabetes Mother   . Hypertension  Mother   . Heart disease Mother   . Alzheimer's disease Mother   . Cancer Father   . Diabetes Father   . Hypertension Sister   . Depression Sister   . Fibromyalgia Sister   . Cancer Brother   . Heart disease Brother   . Stroke Son   . Fibromyalgia Daughter   . Bladder Cancer Neg Hx   . Kidney disease Neg Hx   . Kidney cancer Neg Hx     Past Surgical History:  Procedure Laterality Date  . ABDOMINAL HYSTERECTOMY     due to cancer of ovaries  . BACK SURGERY N/A 2014   x 5  . BACK SURGERY  12/2017  . CARDIAC CATHETERIZATION     no PCI; 12/18/12 ([facility], Dr. Marcello Moores): EP study with ablation. No coronary angiography done then.  . COLONOSCOPY W/ POLYPECTOMY    . COLONOSCOPY WITH PROPOFOL N/A 02/04/2016   Procedure: COLONOSCOPY WITH PROPOFOL;  Surgeon: Robert Bellow, MD;  Location[facility] ENDOSCOPY;  Service: Endoscopy;  Laterality: N/A;  . ESOPHAGOGASTRODUODENOSCOPY (EGD) WITH PROPOFOL N/A 02/04/2016   Procedure: ESOPHAGOGASTRODUODENOSCOPY (EGD) WITH PROPOFOL;  Surgeon: Robert Bellow, MD;  Location: [facility] ENDOSCOPY;  Service: Endoscopy;  Laterality: N/A;  . FINGER ARTHROSCOPY WITH CARPOMETACARPEL ([place]) ARTHROPLASTY Left 05/01/2016   Dr. Erlinda Hong  . GIVENS CAPSULE STUDY N/A 05/02/2016   Procedure: GIVENS CAPSULE STUDY;  Surgeon: Manya Silvas, MD;  Location[facility] ENDOSCOPY;  Service: Endoscopy;  Laterality: N/A;  . HAND SURGERY    . HEMORRHOID SURGERY    . HIP ARTHROPLASTY    . JOINT REPLACEMENT Bilateral    knee  . KNEE ARTHROPLASTY    . REPLACEMENT TOTAL KNEE Left   . SHOULDER ARTHROSCOPY Left 09/09/2018   Procedure: LEFT SHOULDER ARTHROSCOPY, BICEPS TENOTOMY;  Surgeon: Meredith Pel, MD;  Location: [place];  Service: Orthopedics;  Laterality: Left;  . SPINE SURGERY     x 2  . tendonititis     right elbow, right wrist  . TOTAL HIP ARTHROPLASTY Left 2013  . TOTAL HIP ARTHROPLASTY Right 08/16/2015   Procedure: TOTAL HIP ARTHROPLASTY ANTERIOR APPROACH;   Surgeon: Meredith Pel, MD;  Location: [place];  Service: Orthopedics;  Laterality: Right;  . TOTAL KNEE ARTHROPLASTY Right 03/16/2015   Procedure: RIGHT TOTAL KNEE ARTHROPLASTY, PCL SACRIFICING;  Surgeon: Meredith Pel, MD;  Location: [place];  Service: Orthopedics;  Laterality: Right;   Social History   Occupational History  . Occupation: Retired  Tobacco Use  . Smoking status: Never Smoker  . Smokeless tobacco: Never Used  . Tobacco comment: smoking cessation materials not required  Substance and Sexual Activity  . Alcohol use: No    Alcohol/week: 0.0 standard drinks  . Drug use: No  . Sexual activity: Never

## 2018-10-17 DIAGNOSIS — J45909 Unspecified asthma, uncomplicated: Secondary | ICD-10-CM | POA: Diagnosis not present

## 2018-10-17 DIAGNOSIS — F0280 Dementia in other diseases classified elsewhere without behavioral disturbance: Secondary | ICD-10-CM | POA: Diagnosis not present

## 2018-10-17 DIAGNOSIS — F419 Anxiety disorder, unspecified: Secondary | ICD-10-CM | POA: Diagnosis not present

## 2018-10-17 DIAGNOSIS — S43432D Superior glenoid labrum lesion of left shoulder, subsequent encounter: Secondary | ICD-10-CM | POA: Diagnosis not present

## 2018-10-17 DIAGNOSIS — M797 Fibromyalgia: Secondary | ICD-10-CM | POA: Diagnosis not present

## 2018-10-17 DIAGNOSIS — Z6838 Body mass index (BMI) 38.0-38.9, adult: Secondary | ICD-10-CM | POA: Diagnosis not present

## 2018-10-17 DIAGNOSIS — K219 Gastro-esophageal reflux disease without esophagitis: Secondary | ICD-10-CM | POA: Diagnosis not present

## 2018-10-17 DIAGNOSIS — I10 Essential (primary) hypertension: Secondary | ICD-10-CM | POA: Diagnosis not present

## 2018-10-17 DIAGNOSIS — E785 Hyperlipidemia, unspecified: Secondary | ICD-10-CM | POA: Diagnosis not present

## 2018-10-17 DIAGNOSIS — M5136 Other intervertebral disc degeneration, lumbar region: Secondary | ICD-10-CM | POA: Diagnosis not present

## 2018-10-17 DIAGNOSIS — K589 Irritable bowel syndrome without diarrhea: Secondary | ICD-10-CM | POA: Diagnosis not present

## 2018-10-17 DIAGNOSIS — M810 Age-related osteoporosis without current pathological fracture: Secondary | ICD-10-CM | POA: Diagnosis not present

## 2018-10-17 DIAGNOSIS — E039 Hypothyroidism, unspecified: Secondary | ICD-10-CM | POA: Diagnosis not present

## 2018-10-17 DIAGNOSIS — E669 Obesity, unspecified: Secondary | ICD-10-CM | POA: Diagnosis not present

## 2018-10-17 DIAGNOSIS — G309 Alzheimer's disease, unspecified: Secondary | ICD-10-CM | POA: Diagnosis not present

## 2018-10-17 DIAGNOSIS — F329 Major depressive disorder, single episode, unspecified: Secondary | ICD-10-CM | POA: Diagnosis not present

## 2018-10-21 ENCOUNTER — Other Ambulatory Visit: Payer: Self-pay | Admitting: Family Medicine

## 2018-10-21 DIAGNOSIS — F410 Panic disorder [episodic paroxysmal anxiety] without agoraphobia: Secondary | ICD-10-CM

## 2018-10-22 DIAGNOSIS — G309 Alzheimer's disease, unspecified: Secondary | ICD-10-CM | POA: Diagnosis not present

## 2018-10-22 DIAGNOSIS — F0280 Dementia in other diseases classified elsewhere without behavioral disturbance: Secondary | ICD-10-CM | POA: Diagnosis not present

## 2018-10-22 DIAGNOSIS — J45909 Unspecified asthma, uncomplicated: Secondary | ICD-10-CM | POA: Diagnosis not present

## 2018-10-22 DIAGNOSIS — I10 Essential (primary) hypertension: Secondary | ICD-10-CM | POA: Diagnosis not present

## 2018-10-22 DIAGNOSIS — M810 Age-related osteoporosis without current pathological fracture: Secondary | ICD-10-CM | POA: Diagnosis not present

## 2018-10-22 DIAGNOSIS — S43432D Superior glenoid labrum lesion of left shoulder, subsequent encounter: Secondary | ICD-10-CM | POA: Diagnosis not present

## 2018-10-28 ENCOUNTER — Ambulatory Visit (INDEPENDENT_AMBULATORY_CARE_PROVIDER_SITE_OTHER): Payer: Medicare Other | Admitting: Family Medicine

## 2018-10-28 ENCOUNTER — Encounter: Payer: Self-pay | Admitting: Family Medicine

## 2018-10-28 ENCOUNTER — Other Ambulatory Visit: Payer: Self-pay

## 2018-10-28 VITALS — BP 114/64 | HR 70 | Temp 96.6°F | Resp 16 | Ht 64.0 in | Wt 207.3 lb

## 2018-10-28 DIAGNOSIS — E559 Vitamin D deficiency, unspecified: Secondary | ICD-10-CM

## 2018-10-28 DIAGNOSIS — M797 Fibromyalgia: Secondary | ICD-10-CM | POA: Diagnosis not present

## 2018-10-28 DIAGNOSIS — R739 Hyperglycemia, unspecified: Secondary | ICD-10-CM

## 2018-10-28 DIAGNOSIS — E039 Hypothyroidism, unspecified: Secondary | ICD-10-CM | POA: Diagnosis not present

## 2018-10-28 DIAGNOSIS — F3160 Bipolar disorder, current episode mixed, unspecified: Secondary | ICD-10-CM

## 2018-10-28 DIAGNOSIS — B001 Herpesviral vesicular dermatitis: Secondary | ICD-10-CM

## 2018-10-28 DIAGNOSIS — I209 Angina pectoris, unspecified: Secondary | ICD-10-CM | POA: Diagnosis not present

## 2018-10-28 DIAGNOSIS — J449 Chronic obstructive pulmonary disease, unspecified: Secondary | ICD-10-CM

## 2018-10-28 DIAGNOSIS — R7303 Prediabetes: Secondary | ICD-10-CM | POA: Diagnosis not present

## 2018-10-28 DIAGNOSIS — E781 Pure hyperglyceridemia: Secondary | ICD-10-CM | POA: Diagnosis not present

## 2018-10-28 DIAGNOSIS — B370 Candidal stomatitis: Secondary | ICD-10-CM

## 2018-10-28 DIAGNOSIS — I10 Essential (primary) hypertension: Secondary | ICD-10-CM

## 2018-10-28 DIAGNOSIS — E785 Hyperlipidemia, unspecified: Secondary | ICD-10-CM

## 2018-10-28 DIAGNOSIS — R2232 Localized swelling, mass and lump, left upper limb: Secondary | ICD-10-CM | POA: Diagnosis not present

## 2018-10-28 DIAGNOSIS — D649 Anemia, unspecified: Secondary | ICD-10-CM | POA: Diagnosis not present

## 2018-10-28 DIAGNOSIS — Z23 Encounter for immunization: Secondary | ICD-10-CM

## 2018-10-28 DIAGNOSIS — Z79899 Other long term (current) drug therapy: Secondary | ICD-10-CM

## 2018-10-28 DIAGNOSIS — J4489 Other specified chronic obstructive pulmonary disease: Secondary | ICD-10-CM

## 2018-10-28 DIAGNOSIS — I4510 Unspecified right bundle-branch block: Secondary | ICD-10-CM

## 2018-10-28 DIAGNOSIS — F410 Panic disorder [episodic paroxysmal anxiety] without agoraphobia: Secondary | ICD-10-CM

## 2018-10-28 MED ORDER — HYDROXYZINE PAMOATE 25 MG PO CAPS: 25.0000 mg | ORAL_CAPSULE | Freq: Three times a day (TID) | ORAL | 0 refills | Status: DC | PRN

## 2018-10-28 MED ORDER — OLMESARTAN MEDOXOMIL-HCTZ 40-12.5 MG PO TABS: 1.0000 | ORAL_TABLET | Freq: Every day | ORAL | 1 refills | Status: DC

## 2018-10-28 MED ORDER — VALACYCLOVIR HCL 1 G PO TABS: 1000.0000 mg | ORAL_TABLET | Freq: Two times a day (BID) | ORAL | 0 refills | Status: DC

## 2018-10-28 MED ORDER — VENLAFAXINE HCL ER 75 MG PO CP24: 75.0000 mg | ORAL_CAPSULE | Freq: Every day | ORAL | 1 refills | Status: DC

## 2018-10-28 MED ORDER — NYSTATIN 100000 UNIT/ML MT SUSP: 5.0000 mL | Freq: Four times a day (QID) | OROMUCOSAL | 0 refills | Status: DC

## 2018-10-28 NOTE — Progress Notes (Signed)
Name: Jacqueline Lucas   MRN: [id number]    DOB: January 09, 1951   Date:10/28/2018       Progress Note  Subjective  Chief Complaint  Chief Complaint  Patient presents with  . Medication Refill  . Hypertension  . Asthma    Dulera is causing her to have bumps on her mouth and break out in fever blisters.   . Back Pain  . Migraine  . COPD  . Constipation    Linzess is helping with her constipation.   . Dyslipidemia  . Mass    Under her left arm-feels like it is getting bigger and would like Dr. Ancil Boozer to look at it.    HPI  Mass left axilla: present for years however seems to be growing now, pea size during exam, I will refer her to surgeon  HTN/SVT she is takingBenicar - 40-12.5, mteoprolol,and is doing well,no chest pain or palpitation. BP is at goal. She also found a RBBB on EKG in 2019 and 2020  Bipolar disorder: she is doing well on Depakote and Effexor, denies mania,but she states continues to have episodes of not sleeping for two or three days in a row, we will try adding Seroquel, that is chronic  She states she still taking hydroxyzine prn and seems to help with anxiety. Her children are very supportive   Trush: she has notice increase in burning in her mouth and also little bumps on tip of her tongue  FMS: seeing Dr. Olegario Messier on muscle relaxer and gabapentin. Unchanged  Dyslipidemia: currently on lipitor only, off Tricorand on Lovaza, no side effects. Discussed rechecking labs  Goiter: she was seen by Dr. Ezequiel Ganser the past, but switched to Dr. Gabriel Carina and was given reassurance, no need for biopsy at this time, she states Dr. Gabriel Carina monitors her level   COPD/Asthma:  from second hand smoking, taking Duleraand singulair.She states currently doing well with no wheezing or sob. No changes at this time    Angina/ History of SVT: doing well, on metoprolol, statin, and aspirin, sees Dr. Clayborn Bigness yearlyand is due for follow up  Obesity & Hyperglycemia: shehas  hyperglycemia./insuilin resistance,She has some metformin at home but caused diarrhea and she stopped it.last A1C was up at 6.4%, we will recheck today   Constipation: seen by GI back in 2017 with same symptoms, colonoscopy was normal, and advised to take Miralax, she is having bowel movements a few times a week with linzess and denies side effects  Patient Active Problem List   Diagnosis Date Noted  . Superior glenoid labrum lesion of left shoulder   . Lumbar stenosis 02/04/2018  . Hx of hysterectomy 08/13/2017  . Pain in left wrist 06/12/2017  . Chronic venous insufficiency 06/09/2017  . Varicose veins of both lower extremities with pain 04/30/2017  . Claudication ([place]) 04/30/2017  . Arthritis of carpometacarpal [facility]) joint of left thumb 05/12/2016  . Bilateral thumb pain 05/01/2016  . Anemia 02/03/2016  . Status post bilateral total hip replacement 01/10/2016  . Sicca ([place]) 01/10/2016  . Age related osteoporosis 01/10/2016  . Hip arthritis 08/16/2015  . Microscopic hematuria 07/30/2015  . Vaginal atrophy 07/30/2015  . GAD (generalized anxiety disorder) 07/08/2015  . Moderate episode of recurrent major depressive disorder ([place]) 07/08/2015  . Migraine with aura and without status migrainosus, not intractable 07/08/2015  . Dyslipidemia 07/08/2015  . Degenerative arthritis of right knee 03/16/2015  . Atrophic vaginitis 01/31/2015  . Recurrent UTI 12/31/2014  . IBS (irritable bowel syndrome) 10/29/2014  .  Asthma, moderate persistent 09/08/2014  . Allergic state 09/07/2014  . Colon polyp 09/07/2014  . Hemorrhoid 09/07/2014  . Constrictive tenosynovitis 03/12/2014  . H/O total knee replacement, bilateral 07/31/2013  . CAD (coronary artery disease) 11/28/2012  . Anxiety 11/28/2012  . Acid reflux 11/28/2012  . Cardiac murmur 11/28/2012  . BP (high blood pressure) 11/28/2012  . Cannot sleep 11/28/2012  . Adiposity 11/28/2012  . Restless leg 11/28/2012  . Supraventricular  tachycardia ([place]) 11/28/2012  . Fibromyalgia 11/28/2012  . Tachycardia 11/28/2012    Past Surgical History:  Procedure Laterality Date  . ABDOMINAL HYSTERECTOMY     due to cancer of ovaries  . BACK SURGERY N/A 2014   x 5  . BACK SURGERY  12/2017  . CARDIAC CATHETERIZATION     no PCI; 12/18/12 ([facility], Dr. Marcello Moores): EP study with ablation. No coronary angiography done then.  . COLONOSCOPY W/ POLYPECTOMY    . COLONOSCOPY WITH PROPOFOL N/A 02/04/2016   Procedure: COLONOSCOPY WITH PROPOFOL;  Surgeon: Robert Bellow, MD;  Location[facility] END[facility]COPY;  Service: Endoscopy;  Laterality: N/A;  . ESOPHAGOGASTRODUODENOSCOPY (EGD) WITH PROPOFOL N/A 02/04/2016   Procedure: ESOPHAGOGASTRODUODENOSCOPY (EGD) WITH PROPOFOL;  Surgeon: Robert Bellow, MD;  Location: [facility] ENDOSCOPY;  Service: Endoscopy;  Laterality: N/A;  . FINGER ARTHROSCOPY WITH CARPOMETACARPEL ([place]) ARTHROPLASTY Left 05/01/2016   Dr. Erlinda Hong  . GIVENS CAPSULE STUDY N/A 05/02/2016   Procedure: GIVENS CAPSULE STUDY;  Surgeon: Manya Silvas, MD;  Location[facility] ENDOSCOPY;  Service: Endoscopy;  Laterality: N/A;  . HAND SURGERY    . HEMORRHOID SURGERY    . HIP ARTHROPLASTY    . JOINT REPLACEMENT Bilateral    knee  . KNEE ARTHROPLASTY    . REPLACEMENT TOTAL KNEE Left   . SHOULDER ARTHROSCOPY Left 09/09/2018   Procedure: LEFT SHOULDER ARTHROSCOPY, BICEPS TENOTOMY;  Surgeon: Meredith Pel, MD;  Location: [place];  Service: Orthopedics;  Laterality: Left;  . SPINE SURGERY     x 2  . tendonititis     right elbow, right wrist  . TOTAL HIP ARTHROPLASTY Left 2013  . TOTAL HIP ARTHROPLASTY Right 08/16/2015   Procedure: TOTAL HIP ARTHROPLASTY ANTERIOR APPROACH;  Surgeon: Meredith Pel, MD;  Location: [place];  Service: Orthopedics;  Laterality: Right;  . TOTAL KNEE ARTHROPLASTY Right 03/16/2015   Procedure: RIGHT TOTAL KNEE ARTHROPLASTY, PCL SACRIFICING;  Surgeon: Meredith Pel, MD;  Location: [place];  Service:  Orthopedics;  Laterality: Right;    Family History  Problem Relation Age of Onset  . Diabetes Mother   . Hypertension Mother   . Heart disease Mother   . Alzheimer's disease Mother   . Cancer Father   . Diabetes Father   . Hypertension Sister   . Depression Sister   . Fibromyalgia Sister   . Cancer Brother   . Heart disease Brother   . Stroke Son   . Fibromyalgia Daughter   . Bladder Cancer Neg Hx   . Kidney disease Neg Hx   . Kidney cancer Neg Hx     Social History   Socioeconomic History  . Marital status: Widowed    Spouse name: Fritz Pickerel  . Number of children: 4  . Years of education: some college  . Highest education level: 12th grade  Occupational History  . Occupation: Retired  Scientific laboratory technician  . Financial resource strain: Not hard at all  . Food insecurity    Worry: Never true    Inability: Never true  .  Transportation needs    Medical: No    Non-medical: No  Tobacco Use  . Smoking status: Never Smoker  . Smokeless tobacco: Never Used  . Tobacco comment: smoking cessation materials not required  Substance and Sexual Activity  . Alcohol use: No    Alcohol/week: 0.0 standard drinks  . Drug use: No  . Sexual activity: Never  Lifestyle  . Physical activity    Days per week: 3 days    Minutes per session: 30 min  . Stress: To some extent  Relationships  . Social connections    Talks on phone: More than three times a week    Gets together: Twice a week    Attends religious service: 1 to 4 times per year    Active member of club or organization: No    Attends meetings of clubs or organizations: Never    Relationship status: Married  . Intimate partner violence    Fear of current or ex partner: No    Emotionally abused: No    Physically abused: No    Forced sexual activity: No  Other Topics Concern  . Not on file  Social History Narrative   Patient has 10 grandchildren, 4 great-granddaughters & 2 great-grandsons.   Widow since 02/2018 - married for 42  years     Current Outpatient Medications:  .  aspirin 81 MG chewable tablet, Chew 81 mg by mouth daily., Disp: , Rfl:  .  atorvastatin (LIPITOR) 40 MG tablet, TAKE 1 TABLET BY MOUTH EVERYDAY AT BEDTIME, Disp: 90 tablet, Rfl: 0 .  Cholecalciferol (D3-1000) 1000 units tablet, Take 1,000 Units by mouth at bedtime. , Disp: , Rfl:  .  diclofenac sodium (VOLTAREN) 1 % GEL, Apply topically., Disp: , Rfl:  .  divalproex (DEPAKOTE) 250 MG DR tablet, TAKE 1 TABLET TWICE A DAY, Disp: 180 tablet, Rfl: 1 .  DULERA 200-5 MCG/ACT AERO, USE 2 INHALATIONS ORALLY   TWICE DAILY, Disp: 39 g, Rfl: 0 .  estradiol (ESTRACE) 0.1 MG/GM vaginal cream, FOR DIRECTIONS ON HOW TO   TAKE THIS MEDICINE, READ   THE ENCLOSED MEDICATION    INFORMATION FORM, Disp: 42.5 g, Rfl: 3 .  gabapentin (NEURONTIN) 300 MG capsule, TAKE 1 CAPSULE EVERY MORNING, TAKE 1 CAPSULE AT NOON AND TAKE 2 CAPSULES   (600MG ) IN THE EVENING, Disp: 360 capsule, Rfl: 0 .  hydrOXYzine (VISTARIL) 25 MG capsule, Take 1 capsule (25 mg total) by mouth 3 (three) times daily as needed for anxiety., Disp: 90 capsule, Rfl: 0 .  levalbuterol (XOPENEX) 1.25 MG/3ML nebulizer solution, Take 1.25 mg by nebulization every 4 (four) hours as needed for wheezing., Disp: 72 mL, Rfl: 4 .  levothyroxine (SYNTHROID, LEVOTHROID) 50 MCG tablet, Take 50 mcg by mouth daily before breakfast., Disp: , Rfl:  .  linaclotide (LINZESS) 145 MCG CAPS capsule, Take 1 capsule (145 mcg total) by mouth daily before breakfast., Disp: 90 capsule, Rfl: 1 .  MAGNESIUM OXIDE PO, Take 500 mg by mouth at bedtime. Reported on 07/28/2015, Disp: , Rfl:  .  metoprolol tartrate (LOPRESSOR) 25 MG tablet, TAKE 1 TABLET BY MOUTH TWICE A DAY, Disp: 180 tablet, Rfl: 1 .  montelukast (SINGULAIR) 10 MG tablet, Take 1 tablet (10 mg total) by mouth at bedtime., Disp: 90 tablet, Rfl: 1 .  olmesartan-hydrochlorothiazide (BENICAR HCT) 40-12.5 MG tablet, Take 1 tablet by mouth daily., Disp: 90 tablet, Rfl: 1 .  omega-3  acid ethyl esters (LOVAZA) 1 g capsule, TAKE 2 CAPSULES (2  GRAMS)  DAILY, Disp: 180 capsule, Rfl: 3 .  ondansetron (ZOFRAN) 4 MG tablet, Take 1 tablet (4 mg total) by mouth every 8 (eight) hours as needed for nausea or vomiting., Disp: 20 tablet, Rfl: 1 .  polyethylene glycol (MIRALAX / GLYCOLAX) packet, Take 17 g by mouth daily as needed., Disp: , Rfl:  .  RESTASIS 0.05 % ophthalmic emulsion, , Disp: , Rfl:  .  venlafaxine XR (EFFEXOR XR) 75 MG 24 hr capsule, Take 1 capsule (75 mg total) by mouth daily with breakfast., Disp: 90 capsule, Rfl: 1 .  vitamin B-12 (CYANOCOBALAMIN) 1000 MCG tablet, Take 1,000 mcg by mouth at bedtime., Disp: , Rfl:  .  lansoprazole (PREVACID) 30 MG capsule, Take 1 capsule (30 mg total) daily by mouth., Disp: 90 capsule, Rfl: 1 .  methocarbamol (ROBAXIN) 500 MG tablet, TAKE ONE TABLET EVERY 8 HOURS AS NEEDED, Disp: , Rfl:  .  nystatin (MYCOSTATIN) 100000 UNIT/ML suspension, Take 5 mLs (500,000 Units total) by mouth 4 (four) times daily., Disp: 473 mL, Rfl: 0 .  oxyCODONE (OXY IR/ROXICODONE) 5 MG immediate release tablet, Take 5 mg by mouth every 8 (eight) hours as needed., Disp: , Rfl:  .  valACYclovir (VALTREX) 1000 MG tablet, Take 1 tablet (1,000 mg total) by mouth 2 (two) times daily., Disp: 20 tablet, Rfl: 0  Allergies  Allergen Reactions  . Morphine Nausea And Vomiting    Ok with nausea medication  . Diazepam Nausea And Vomiting  . Lyrica [Pregabalin] Other (See Comments)    Joint swelling  . Metformin And Related     diarrhea  . Latex Rash  . Rash Away  [Petrolatum-Zinc Oxide] Rash and Swelling  . Salicylates Other (See Comments)    Other reaction(s): Headache  . Tape Rash    Please use paper tape    I personally reviewed active problem list, medication list, allergies, family history, social history, health maintenance with the patient/caregiver today.   ROS  Constitutional: Negative for fever or weight change.  Respiratory: Negative for cough and  shortness of breath.   Cardiovascular: Negative for chest pain or palpitations.  Gastrointestinal: Negative for abdominal pain, no bowel changes.  Musculoskeletal: Negative for gait problem or joint swelling.  Skin: Negative for rash.  Neurological: Negative for dizziness or headache.  No other specific complaints in a complete review of systems (except as listed in HPI above).  Objective  Vitals:   10/28/18 1506  BP: 114/64  Pulse: 70  Resp: 16  Temp: (!) 96.6 F (35.9 C)  TempSrc: Temporal  SpO2: 99%  Weight: 207 lb 4.8 oz (94 kg)  Height: 5\' 4"  (1.626 m)    Body mass index is 35.58 kg/m.  Physical Exam  Constitutional: Patient appears well-developed and well-nourished. Obese  No distress.  HEENT: head atraumatic, normocephalic, pupils equal and reactive to light, oral  Mucosa showed geographic tongue, some small bumps on the tip of tongue Axilla: on left pea size nodule possible lymphonodus present, moves under skin  Cardiovascular: Normal rate, regular rhythm and normal heart sounds.  No murmur heard. No BLE edema. Pulmonary/Chest: Effort normal and breath sounds normal. No respiratory distress. Abdominal: Soft.  There is no tenderness. Muscular Skeletal: uses a cane, has slow gait, Psychiatric: Patient has a normal mood and affect. behavior is normal. Judgment and thought content normal.  Recent Results (from the past 2160 hour(s))  SARS Coronavirus 2 (Performed in [facility] hospital lab)     Status: None   Collection  Time: 09/05/18  3:01 PM   Specimen: Nasal Swab  Result Value Ref Range   SARS Coronavirus 2 NEGATIVE NEGATIVE    Comment: (NOTE) SARS-CoV-2 target nucleic acids are NOT DETECTED. The SARS-CoV-2 RNA is generally detectable in upper and lower respiratory specimens during the acute phase of infection. Negative results do not preclude SARS-CoV-2 infection, do not rule out co-infections with other pathogens, and should not be used as the sole basis for  treatment or other patient management decisions. Negative results must be combined with clinical observations, patient history, and epidemiological information. The expected result is Negative. Fact Sheet for Patients: SugarRoll.be Fact Sheet for Healthcare Providers: https://www.woods-mathews.com/ This test is not yet approved or cleared by the [place] FDA and  has been authorized for detection and/or diagnosis of SARS-CoV-2 by FDA under an Emergency Use Authorization (EUA). This EUA will remain  in effect (meaning this test can be used) for the duration of the COVID-19 declaration under Section 56 4(b)(1) of the Act, 21 U.S.C. section 360bbb-3(b)(1), unless the authorization is terminated or revoked sooner. Performed at [facility], [place] [address], [place], [place] [id number]   Basic metabolic panel     Status: Abnormal   Collection Time: 09/06/18  2:30 PM  Result Value Ref Range   Sodium 139 135 - 145 mmol/L   Potassium 3.8 3.5 - 5.1 mmol/L   Chloride 105 98 - 111 mmol/L   CO2 24 22 - 32 mmol/L   Glucose, Bld 113 (H) 70 - 99 mg/dL   BUN 13 8 - 23 mg/dL   Creatinine, Ser 0.84 0.44 - 1.00 mg/dL   Calcium 9.1 8.9 - 10.3 mg/dL   GFR calc non Af Amer >60 >60 mL/min   GFR calc Af Amer >60 >60 mL/min   Anion gap 10 5 - 15    Comment: Performed at [facility], [place] [address] [place], [place] [address]  CBC     Status: Abnormal   Collection Time: 09/06/18  2:30 PM  Result Value Ref Range   WBC 6.6 4.0 - 10.5 K/uL   RBC 4.22 3.87 - 5.11 MIL/uL   Hemoglobin 11.8 (L) 12.0 - 15.0 g/dL   HCT 36.9 36.0 - 46.0 %   MCV 87.4 80.0 - 100.0 fL   MCH 28.0 26.0 - 34.0 pg   MCHC 32.0 30.0 - 36.0 g/dL   RDW 15.2 11.5 - 15.5 %   Platelets 242 150 - 400 K/uL   nRBC 0.0 0.0 - 0.2 %    Comment: Performed at [facility], [place] [address] [place], [place] [address]     PHQ2/9: Depression screen [facility] 2/9 10/28/2018 09/24/2018  07/26/2018 07/11/2018 06/26/2018  Decreased Interest 0 1 1 1 1   Down, Depressed, Hopeless 0 1 1 1 1   PHQ - 2 Score 0 2 2 2 2   Altered sleeping 3 0 1 1 3   Tired, decreased energy 2 1 1 1 1   Change in appetite 1 0 0 1 0  Feeling bad or failure about yourself  0 0 0 1 0  Trouble concentrating 0 0 0 1 1  Moving slowly or fidgety/restless 0 0 0 1 0  Suicidal thoughts 0 0 0 1 0  PHQ-9 Score 6 3 4 9 7   Difficult doing work/chores Somewhat difficult Not difficult at all Not difficult at all Not difficult at all Not difficult at all  Some recent data might be hidden    phq 9  is positive   Fall Risk: Fall Risk  10/28/2018 09/24/2018 07/26/2018 07/11/2018 04/16/2018  Falls in the past year? 0 0 0 0 0  Number falls in past yr: 0 0 0 0 0  Injury with Fall? 0 0 0 0 0  Risk Factor Category  - - - - -  Risk for fall due to : - - - - -  Risk for fall due to: Comment - - - - -  Follow up - Falls prevention discussed - Falls evaluation completed -     Functional Status Survey: Is the patient deaf or have difficulty hearing?: No Does the patient have difficulty seeing, even when wearing glasses/contacts?: Yes Does the patient have difficulty concentrating, remembering, or making decisions?: No Does the patient have difficulty walking or climbing stairs?: Yes Does the patient have difficulty dressing or bathing?: No Does the patient have difficulty doing errands alone such as visiting a doctor's office or shopping?: No    Assessment & Plan  1. Mass of left axilla  - Ambulatory referral to General Surgery  2. Asthma with COPD ([place])  Continue dulera  3. Bipolar 1 disorder, mixed (H[facility])  - venlafaxine XR (EFFEXOR XR) 75 MG 24 hr capsule; Take 1 capsule (75 mg total) by mouth daily with breakfast.  Dispense: 90 capsule; Refill: 1  4. Right bundle branch block (RBBB) determined by electrocardiography  Sees Dr. Clayborn Bigness, present since at least 2019   5. Anemia, unspecified type  - Iron, TIBC  and Ferritin Panel - CBC with Differential/Platelet  6. Adult hypothyroidism  Under the care of endo   7. Fibromyalgia  Stable  8. Pre-diabetes  Recheck A1C  9. Hypertriglyceridemia  - Lipid panel  10. Thrush  - nystatin (MYCOSTATIN) 100000 UNIT/ML suspension; Take 5 mLs (500,000 Units total) by mouth 4 (four) times daily.  Dispense: 473 mL; Refill: 0  11. Hyperglycemia  - Hemoglobin A1c  12. Encounter for long-term current use of medication  - COMPLETE METABOLIC PANEL WITH GFR  13. Vitamin D deficiency  - VITAMIN D 25 Hydroxy (Vit-D Deficiency, Fractures)  14. Panic attack  - hydrOXYzine (VISTARIL) 25 MG capsule; Take 1 capsule (25 mg total) by mouth 3 (three) times daily as needed for anxiety.  Dispense: 90 capsule; Refill: 0  15. Essential hypertension  - olmesartan-hydrochlorothiazide (BENICAR HCT) 40-12.5 MG tablet; Take 1 tablet by mouth daily.  Dispense: 90 tablet; Refill: 1  16. Dyslipidemia  On statin therapy   17. Fever blister  - valACYclovir (VALTREX) 1000 MG tablet; Take 1 tablet (1,000 mg total) by mouth 2 (two) times daily.  Dispense: 20 tablet; Refill: 0

## 2018-10-29 LAB — LIPID PANEL
Cholesterol: 200 mg/dL — ABNORMAL HIGH (ref <200)
HDL: 48 mg/dL — ABNORMAL LOW (ref >=50)
Non-HDL Cholesterol (Calc): 152 mg/dL (calc) — ABNORMAL HIGH (ref <130)
Total CHOL/HDL Ratio: 4.2 (calc) (ref <5.0)
Triglycerides: 538 mg/dL — ABNORMAL HIGH (ref <150)

## 2018-10-29 LAB — CBC WITH DIFFERENTIAL/PLATELET
Absolute Monocytes: 623 cells/uL (ref 200–950)
Basophils Absolute: 60 cells/uL (ref 0–200)
Basophils Relative: 0.9 %
Eosinophils Absolute: 355 cells/uL (ref 15–500)
Eosinophils Relative: 5.3 %
HCT: 35.3 % (ref 35.0–45.0)
Hemoglobin: 11.9 g/dL (ref 11.7–15.5)
Lymphs Abs: 1816 cells/uL (ref 850–3900)
MCH: 28.5 pg (ref 27.0–33.0)
MCHC: 33.7 g/dL (ref 32.0–36.0)
MCV: 84.4 fL (ref 80.0–100.0)
MPV: 10.4 fL (ref 7.5–12.5)
Monocytes Relative: 9.3 %
Neutro Abs: 3846 cells/uL (ref 1500–7800)
Neutrophils Relative %: 57.4 %
Platelets: 280 10*3/uL (ref 140–400)
RBC: 4.18 10*6/uL (ref 3.80–5.10)
RDW: 15 % (ref 11.0–15.0)
Total Lymphocyte: 27.1 %
WBC: 6.7 10*3/uL (ref 3.8–10.8)

## 2018-10-29 LAB — COMPLETE METABOLIC PANEL WITH GFR
AG Ratio: 1.6 (calc) (ref 1.0–2.5)
ALT: 9 U/L (ref 6–29)
AST: 11 U/L (ref 10–35)
Albumin: 3.9 g/dL (ref 3.6–5.1)
Alkaline phosphatase (APISO): 69 U/L (ref 37–153)
BUN: 11 mg/dL (ref 7–25)
CO2: 29 mmol/L (ref 20–32)
Calcium: 9.4 mg/dL (ref 8.6–10.4)
Chloride: 103 mmol/L (ref 98–110)
Creat: 0.82 mg/dL (ref 0.50–0.99)
GFR, Est African American: 85 mL/min/{1.73_m2} (ref >=60)
GFR, Est Non African American: 73 mL/min/{1.73_m2} (ref >=60)
Globulin: 2.5 g/dL (calc) (ref 1.9–3.7)
Glucose, Bld: 117 mg/dL — ABNORMAL HIGH (ref 65–99)
Potassium: 4.2 mmol/L (ref 3.5–5.3)
Sodium: 139 mmol/L (ref 135–146)
Total Bilirubin: 0.3 mg/dL (ref 0.2–1.2)
Total Protein: 6.4 g/dL (ref 6.1–8.1)

## 2018-10-29 LAB — IRON,TIBC AND FERRITIN PANEL
%SAT: 13 % (calc) — ABNORMAL LOW (ref 16–45)
Ferritin: 11 ng/mL — ABNORMAL LOW (ref 16–288)
Iron: 47 ug/dL (ref 45–160)
TIBC: 366 mcg/dL (calc) (ref 250–450)

## 2018-10-29 LAB — HEMOGLOBIN A1C
Hgb A1c MFr Bld: 6.2 % of total Hgb — ABNORMAL HIGH (ref <5.7)
Mean Plasma Glucose: 131 (calc)
eAG (mmol/L): 7.3 (calc)

## 2018-10-29 LAB — VITAMIN D 25 HYDROXY (VIT D DEFICIENCY, FRACTURES): Vit D, 25-Hydroxy: 26 ng/mL — ABNORMAL LOW (ref 30–100)

## 2018-10-31 DIAGNOSIS — K219 Gastro-esophageal reflux disease without esophagitis: Secondary | ICD-10-CM | POA: Diagnosis not present

## 2018-10-31 DIAGNOSIS — R002 Palpitations: Secondary | ICD-10-CM | POA: Diagnosis not present

## 2018-10-31 DIAGNOSIS — E6609 Other obesity due to excess calories: Secondary | ICD-10-CM | POA: Diagnosis not present

## 2018-10-31 DIAGNOSIS — I10 Essential (primary) hypertension: Secondary | ICD-10-CM | POA: Diagnosis not present

## 2018-10-31 DIAGNOSIS — R011 Cardiac murmur, unspecified: Secondary | ICD-10-CM | POA: Diagnosis not present

## 2018-10-31 DIAGNOSIS — R000 Tachycardia, unspecified: Secondary | ICD-10-CM | POA: Diagnosis not present

## 2018-10-31 DIAGNOSIS — Z6835 Body mass index (BMI) 35.0-35.9, adult: Secondary | ICD-10-CM | POA: Diagnosis not present

## 2018-10-31 DIAGNOSIS — I471 Supraventricular tachycardia: Secondary | ICD-10-CM | POA: Diagnosis not present

## 2018-11-11 ENCOUNTER — Ambulatory Visit: Payer: Medicare Other | Admitting: Surgery

## 2018-11-12 ENCOUNTER — Other Ambulatory Visit: Payer: Self-pay | Admitting: Family Medicine

## 2018-11-12 DIAGNOSIS — E785 Hyperlipidemia, unspecified: Secondary | ICD-10-CM

## 2018-11-14 ENCOUNTER — Other Ambulatory Visit: Payer: Self-pay | Admitting: Family Medicine

## 2018-11-14 DIAGNOSIS — Z1231 Encounter for screening mammogram for malignant neoplasm of breast: Secondary | ICD-10-CM

## 2018-11-14 DIAGNOSIS — N6459 Other signs and symptoms in breast: Secondary | ICD-10-CM

## 2018-11-14 DIAGNOSIS — N6310 Unspecified lump in the right breast, unspecified quadrant: Secondary | ICD-10-CM

## 2018-11-14 DIAGNOSIS — R2231 Localized swelling, mass and lump, right upper limb: Secondary | ICD-10-CM

## 2018-11-15 ENCOUNTER — Other Ambulatory Visit: Payer: Self-pay | Admitting: Rheumatology

## 2018-11-15 NOTE — Telephone Encounter (Signed)
Last Visit: 09/03/18 Next Visit due January 2021. Message sent to the front to schedule patient.   Okay to refill per Dr. Estanislado Pandy

## 2018-11-15 NOTE — Progress Notes (Signed)
Office Visit Note  Patient: Jacqueline Lucas             Date of Birth: 1950/10/13           MRN: [id number]             PCP: [name], MD Referring: [name], MD Visit Date: 11/28/2018 Occupation: @GUAROCC @  Subjective:  Bilateral trochanteric bursitis   History of Present Illness: [name] is a 68 y.o. female with history of fibromyalgia, osteoarthritis, and DDD.  Patient presents today with trochanter bursitis bilaterally.  She states that the pain has been constant for the past 3 to 4 months.  She would like cortisone injections bilaterally today.  She states she continues have generalized muscle aches and muscle tenderness due to fibromyalgia.  She states that her myalgias are worse with frequent weather changes.  She continues to take baclofen 10 mg twice daily and gabapentin as prescribed which helps with her pain and muscle spasms.  She continues to have chronic pain in both hands especially bilateral CMC joints.  She states that she follows up with Dr. Erlinda Hong who has performed cortisone injections in the [facility] joints in the past.  She states that bilateral replacements and bilateral knee replacements are doing well without any discomfort.  She continues to walk with a cane.  She has not had any recent falls.  She had a normal bone density performed on 09/25/2017.   Activities of Daily Living:  Patient reports morning stiffness for 3 hours.   Patient Reports nocturnal pain.  Difficulty dressing/grooming: Denies Difficulty climbing stairs: Reports Difficulty getting out of chair: Denies Difficulty using hands for taps, buttons, cutlery, and/or writing: Denies  Review of Systems  Constitutional: Negative for fatigue.  HENT: Positive for mouth dryness. Negative for mouth sores and nose dryness.   Eyes: Positive for dryness. Negative for pain and visual disturbance.  Respiratory: Negative for cough, hemoptysis and difficulty breathing.   Cardiovascular: Negative  for chest pain, palpitations, hypertension and swelling in legs/feet.  Gastrointestinal: Positive for constipation and diarrhea. Negative for blood in stool.  Endocrine: Negative for increased urination.  Genitourinary: Negative for difficulty urinating and painful urination.  Musculoskeletal: Positive for arthralgias, joint pain, joint swelling, morning stiffness and muscle tenderness. Negative for myalgias, muscle weakness and myalgias.  Skin: Positive for rash. Negative for color change, pallor, hair loss, nodules/bumps, skin tightness, ulcers and sensitivity to sunlight.  Allergic/Immunologic: Negative for susceptible to infections.  Neurological: Positive for headaches. Negative for dizziness, numbness and weakness.  Hematological: Negative for bruising/bleeding tendency and swollen glands.  Psychiatric/Behavioral: Positive for sleep disturbance. Negative for depressed mood. The patient is not nervous/anxious.     PMFS History:  Patient Active Problem List   Diagnosis Date Noted  . Superior glenoid labrum lesion of left shoulder   . Lumbar stenosis 02/04/2018  . Hx of hysterectomy 08/13/2017  . Pain in left wrist 06/12/2017  . Chronic venous insufficiency 06/09/2017  . Varicose veins of both lower extremities with pain 04/30/2017  . Claudication ([place]) 04/30/2017  . Arthritis of carpometacarpal [facility]) joint of left thumb 05/12/2016  . Bilateral thumb pain 05/01/2016  . Anemia 02/03/2016  . Status post bilateral total hip replacement 01/10/2016  . Sicca ([place]) 01/10/2016  . Age related osteoporosis 01/10/2016  . Hip arthritis 08/16/2015  . Microscopic hematuria 07/30/2015  . Vaginal atrophy 07/30/2015  . GAD (generalized anxiety disorder) 07/08/2015  . Moderate episode of recurrent major depressive disorder ([place]) 07/08/2015  .  Migraine with aura and without status migrainosus, not intractable 07/08/2015  . Dyslipidemia 07/08/2015  . Degenerative arthritis of right knee 03/16/2015   . Atrophic vaginitis 01/31/2015  . Recurrent UTI 12/31/2014  . IBS (irritable bowel syndrome) 10/29/2014  . Asthma, moderate persistent 09/08/2014  . Allergic state 09/07/2014  . Colon polyp 09/07/2014  . Hemorrhoid 09/07/2014  . Constrictive tenosynovitis 03/12/2014  . H/O total knee replacement, bilateral 07/31/2013  . CAD (coronary artery disease) 11/28/2012  . Anxiety 11/28/2012  . Acid reflux 11/28/2012  . Cardiac murmur 11/28/2012  . BP (high blood pressure) 11/28/2012  . Cannot sleep 11/28/2012  . Adiposity 11/28/2012  . Restless leg 11/28/2012  . Supraventricular tachycardia ([place]) 11/28/2012  . Fibromyalgia 11/28/2012  . Tachycardia 11/28/2012    Past Medical History:  Diagnosis Date  . Allergy   . Allergy to adhesive tape    Allergy to paper tape as well  . Alzheimer disease ([place])   . Anemia   . Anginal pain ([place])   . Anxiety   . Arthritis   . Asthma   . Bronchitis   . Cataract   . Chronic nausea   . Claustrophobia   . DDD (degenerative disc disease), lumbar   . Depression   . Dry eyes    uses Restasis  . Fibromyalgia   . GERD (gastroesophageal reflux disease)   . H/O bladder infections   . Heart murmur   . Hematuria   . Hyperlipidemia   . Hypertension   . Hypothyroidism   . IBS (irritable bowel syndrome)   . Insomnia   . Lumbar neuritis   . Migraines   . Obesity   . Osteoporosis   . Palpitation   . RLS (restless legs syndrome)   . Shoulder fracture, right   . Sicca ([place])   . Tachycardia   . Thyroid disease     Family History  Problem Relation Age of Onset  . Diabetes Mother   . Hypertension Mother   . Heart disease Mother   . Alzheimer's disease Mother   . Diabetes Father   . Throat cancer Father   . Hypertension Sister   . Depression Sister   . Fibromyalgia Sister   . Cancer Brother   . Heart disease Brother   . Stroke Son   . Fibromyalgia Daughter   . Bladder Cancer Neg Hx   . Kidney disease Neg Hx   . Kidney cancer Neg Hx     Past Surgical History:  Procedure Laterality Date  . ABDOMINAL HYSTERECTOMY     due to cancer of ovaries  . BACK SURGERY N/A 2014   x 5  . BACK SURGERY  12/2017  . CARDIAC CATHETERIZATION     no PCI; 12/18/12 ([facility], Dr. Marcello Moores): EP study with ablation. No coronary angiography done then.  . COLONOSCOPY W/ POLYPECTOMY    . COLONOSCOPY WITH PROPOFOL N/A 02/04/2016   Procedure: COLONOSCOPY WITH PROPOFOL;  Surgeon: Robert Bellow, MD;  Location[facility] ENDOSCOPY;  Service: Endoscopy;  Laterality: N/A;  . ESOPHAGOGASTRODUODENOSCOPY (EGD) WITH PROPOFOL N/A 02/04/2016   Procedure: ESOPHAGOGASTRODUODENOSCOPY (EGD) WITH PROPOFOL;  Surgeon: Robert Bellow, MD;  Location: [facility] ENDOSCOPY;  Service: Endoscopy;  Laterality: N/A;  . FINGER ARTHROSCOPY WITH CARPOMETACARPEL ([place]) ARTHROPLASTY Left 05/01/2016   Dr. Erlinda Hong  . GIVENS CAPSULE STUDY N/A 05/02/2016   Procedure: GIVENS CAPSULE STUDY;  Surgeon: Manya Silvas, MD;  [facility] END[facility]COPY;  Service: Endoscopy;  Laterality: N/A;  . HAND SURGERY    .  HEMORRHOID SURGERY    . HIP ARTHROPLASTY    . JOINT REPLACEMENT Bilateral    knee  . KNEE ARTHROPLASTY    . REPLACEMENT TOTAL KNEE Left   . SHOULDER ARTHROSCOPY Left 09/09/2018   Procedure: LEFT SHOULDER ARTHROSCOPY, BICEPS TENOTOMY;  Surgeon: Meredith Pel, MD;  Location: [place];  Service: Orthopedics;  Laterality: Left;  . SPINE SURGERY     x 2  . tendonititis     right elbow, right wrist  . TOTAL HIP ARTHROPLASTY Left 2013  . TOTAL HIP ARTHROPLASTY Right 08/16/2015   Procedure: TOTAL HIP ARTHROPLASTY ANTERIOR APPROACH;  Surgeon: Meredith Pel, MD;  Location: [place];  Service: Orthopedics;  Laterality: Right;  . TOTAL KNEE ARTHROPLASTY Right 03/16/2015   Procedure: RIGHT TOTAL KNEE ARTHROPLASTY, PCL SACRIFICING;  Surgeon: Meredith Pel, MD;  Location: [place];  Service: Orthopedics;  Laterality: Right;   Social History   Social History Narrative   Patient has  10 grandchildren, 4 great-granddaughters & 2 great-grandsons.   Widow since 02/2018 - married for 42 years   Immunization History  Administered Date(s) Administered  . Fluad Quad(high Dose 65+) 10/28/2018  . Influenza, High Dose Seasonal PF 02/15/2015, 11/10/2015, 01/11/2017, 12/26/2017  . Influenza-Unspecified 02/25/2014, 12/31/2017  . Pneumococcal Conjugate-13 06/03/2013  . Pneumococcal Polysaccharide-23 09/05/2011, 01/11/2017  . Tdap 09/05/2011  . Zoster 12/12/2010     Objective: Vital Signs: BP 116/69 (BP Location: Left Arm, Patient Position: Sitting, Cuff Size: Normal)   Pulse 83   Resp 20   Ht 5\' 4"  (1.626 m)   Wt 210 lb 3.2 oz (95.3 kg)   BMI 36.08 kg/m    Physical Exam Vitals signs and nursing note reviewed.  Constitutional:      Appearance: She is well-developed.  HENT:     Head: Normocephalic and atraumatic.  Eyes:     Conjunctiva/sclera: Conjunctivae normal.  Neck:     Musculoskeletal: Normal range of motion.  Cardiovascular:     Rate and Rhythm: Normal rate and regular rhythm.     Heart sounds: Normal heart sounds.  Pulmonary:     Effort: Pulmonary effort is normal.     Breath sounds: Normal breath sounds.  Abdominal:     General: Bowel sounds are normal.     Palpations: Abdomen is soft.  Lymphadenopathy:     Cervical: No cervical adenopathy.  Skin:    General: Skin is warm and dry.     Capillary Refill: Capillary refill takes less than 2 seconds.  Neurological:     Mental Status: She is alert and oriented to person, place, and time.  Psychiatric:        Behavior: Behavior normal.      Musculoskeletal Exam: Generalized hyperalgesia and positive tender points on exam.  C-spine limited range of motion with discomfort.  Thoracic and lumbar spine good range of motion.  She has limited shoulder abduction to about 120 degrees bilaterally.  Elbow joints, wrist joints, MCPs, PIPs, DIPs good range of motion no synovitis.  She has tenderness of bilateral CMC  joints.  She has noted thickening of PIP and DIP joints.  Bilateral hip replacements have good range of motion no discomfort at this time.  Bilateral knee replacements have good range of motion with no warmth or effusion.  Ankle joints have good range of motion with no tenderness or inflammation.  She has tenderness over bilateral trochanteric bursa.  CDAI Exam: CDAI Score: - Patient Global: -; Provider Global: - Swollen: -;  Tender: - Joint Exam   No joint exam has been documented for this visit   There is currently no information documented on the homunculus. Go to the Rheumatology activity and complete the homunculus joint exam.  Investigation: No additional findings.  Imaging: No results found.  Recent Labs: Lab Results  Component Value Date   WBC 6.7 10/28/2018   HGB 11.9 10/28/2018   PLT 280 10/28/2018   NA 139 10/28/2018   K 4.2 10/28/2018   CL 103 10/28/2018   CO2 29 10/28/2018   GLUCOSE 117 (H) 10/28/2018   BUN 11 10/28/2018   CREATININE 0.82 10/28/2018   BILITOT 0.3 10/28/2018   ALKPHOS 105 12/10/2015   AST 11 10/28/2018   ALT 9 10/28/2018   PROT 6.4 10/28/2018   ALBUMIN 4.0 12/10/2015   CALCIUM 9.4 10/28/2018   GFRAA 85 10/28/2018    Speciality Comments: No specialty comments available.  Procedures:  Large Joint Inj: bilateral greater trochanter on 11/28/2018 3:21 PM Indications: pain Details: 27 G 1.5 in needle, lateral approach  Arthrogram: No  Medications (Right): 1.5 mL lidocaine 1 %; 40 mg triamcinolone acetonide 40 MG/ML Aspirate (Right): 0 mL Medications (Left): 1.5 mL lidocaine 1 %; 40 mg triamcinolone acetonide 40 MG/ML Aspirate (Left): 0 mL Outcome: tolerated well, no immediate complications Procedure, treatment alternatives, risks and benefits explained, specific risks discussed. Consent was given by the patient. Immediately prior to procedure a time out was called to verify the correct patient, procedure, equipment, support staff and  site/side marked as required. Patient was prepped and draped in the usual sterile fashion.     Allergies: Morphine, Diazepam, Lyrica [pregabalin], Metformin and related, Latex, Rash away  [petrolatum-zinc oxide], Salicylates, and Tape   Assessment / Plan:     Visit Diagnoses: Fibromyalgia: She has generalized hyperalgesia and positive tender points on examination.  She has frequent and severe fibromyalgia flares.  She has been having  increased myalgias with frequent weather changes.  She presents today with bilateral trochanter bursitis.  She requested bilateral cortisone injections.  She tolerated procedure well.  The procedure note was completed above.  She is given a handout of exercises to perform.  She has chronic lower back pain and was encouraged to perform stretching and strengthening exercises.  She is given a handout of these exercises to perform.  She continues to take baclofen 10 mg twice daily PRN for muscle spasms and takes gabapentin as prescribed.  She feels as though this is a good treatment regimen.  She does not want to make any changes at this time.  She was encouraged to active and exercise on a regular basis.  She is started walking for exercise on a regular basis.  She continues to have interrupted sleep at night.  Her husband passed away several months ago and she continues to have difficulty sleeping at night.  Good sleep hygiene was discussed.  She will follow up in the office in 6 months.   Trochanteric bursitis of both hips: She has tenderness over bilateral trochanteric bursitis.  She states the pain has been constant for 3-4 months.  She has difficulty laying on her sides at night due to the discomfort she experiences.  She requested bilateral trochanteric bursa cortisone injections.  She tolerated the procedure well.  Procedure note completed above.   DDD (degenerative disc disease), lumbar: She has chronic lower back pain.  She was given a handout of back exercises to  perform. She takes baclofen 10 mg BID prn for  muscle spasms.   Status post bilateral total hip replacement: Doing well.  She has no discomfort at this time.  She has good ROM with no discomfort.   H/O total knee replacement, bilateral:Doing well.  She has good ROM with no discomfort.  No warmth or effusion noted.  She continues to walk with a cane to assist with ambulation.   Primary osteoarthritis of first carpometacarpal joint of left hand: She has PIP and DIP synovial thickening consistent with osteoarthritis of both hands.  She has CMC joint synovial thickening bilaterally.  Joint protection and muscle strengthening were discussed.   Keratoconjunctivitis sicca ([place]): She has chronic eye dryness.    Osteopenia of multiple sites: She is on vitamin D 1000 units daily. She has bilateral hip replacements and spinal surgery. Last DEXA on 09/25/2017 ordered by Dr. Ancil Boozer showed T score of 0 at radius.    Other medical conditions are listed as follows:   History of hypertension  History of anxiety  History of depression  History of cardiac murmur  Orders: Orders Placed This Encounter  Procedures  . Large Joint Inj   No orders of the defined types were placed in this encounter.   Face-to-face time spent with patient was 30 minutes. Greater than 50% of time was spent in counseling and coordination of care.  Follow-Up Instructions: Return in about 6 months (around 05/29/2019) for Fibromyalgia, Osteoarthritis, DDD.   Ofilia Neas, PA-C   I examined and evaluated the patient with Hazel Sams PA.  Patient complains of tenderness over bilateral trochanteric bursa.  She has discomfort at nighttime to sleep on the side.  Per her request after informed consent was obtained bilateral trochanteric bursa were injected with cortisone as described above.  She tolerated the procedure well.  The plan of care was discussed as noted above.  Bo Merino, MD  Note - This record has been created  using Editor, commissioning.  Chart creation errors have been sought, but may not always  have been located. Such creation errors do not reflect on  the standard of medical care.

## 2018-11-15 NOTE — Telephone Encounter (Signed)
Please schedule patient for a follow up visit. Patient due January 2021. Thanks! 

## 2018-11-15 NOTE — Telephone Encounter (Signed)
[facility] for patient to schedule her follow-up appointment due in January 2021.

## 2018-11-17 ENCOUNTER — Other Ambulatory Visit (INDEPENDENT_AMBULATORY_CARE_PROVIDER_SITE_OTHER): Payer: Self-pay | Admitting: Surgery

## 2018-11-18 NOTE — Telephone Encounter (Signed)
[place] patient 

## 2018-11-19 ENCOUNTER — Other Ambulatory Visit: Payer: Self-pay | Admitting: Rheumatology

## 2018-11-25 ENCOUNTER — Telehealth: Payer: Self-pay

## 2018-11-25 ENCOUNTER — Ambulatory Visit (INDEPENDENT_AMBULATORY_CARE_PROVIDER_SITE_OTHER): Payer: Medicare Other | Admitting: Surgery

## 2018-11-25 ENCOUNTER — Encounter: Payer: Self-pay | Admitting: Surgery

## 2018-11-25 ENCOUNTER — Other Ambulatory Visit: Payer: Self-pay

## 2018-11-25 VITALS — BP 156/75 | HR 87 | Temp 97.7°F | Resp 16 | Ht 64.0 in | Wt 209.6 lb

## 2018-11-25 DIAGNOSIS — R2232 Localized swelling, mass and lump, left upper limb: Secondary | ICD-10-CM

## 2018-11-25 DIAGNOSIS — I209 Angina pectoris, unspecified: Secondary | ICD-10-CM

## 2018-11-25 NOTE — Telephone Encounter (Signed)
Copied from [place] [id number]. Topic: General - Other >> Nov 25, 2018 12:35 PM Yvette Rack wrote: Reason for CRM: Pt requests call back regarding referral for today's appt.  I called this patient back to see I can be of help to her since she was not seen in our office today and the last referral I see in her chart was in August, but there was no answer. A message was left for her to give [place] a call back.  CRM was updated.

## 2018-11-25 NOTE — Patient Instructions (Addendum)
Follow up after mammogram and axillary ultrasound scheduled on 11/29/18.

## 2018-11-26 ENCOUNTER — Encounter: Payer: Self-pay | Admitting: Surgery

## 2018-11-27 ENCOUNTER — Encounter: Payer: Self-pay | Admitting: Surgery

## 2018-11-27 NOTE — Progress Notes (Signed)
Patient ID: Jacqueline Lucas, female   DOB: 1950/04/25, 68 y.o.   MRN: [id number]  HPI LAILEE Lucas is a 68 y.o. female seen for a left axillary node.  She reports that this she has had this for several years but over the last year or so it has increased in size.  Please note that this is mildly tender and causes some discomfort.  No fevers no chills no B type symptoms.  She did have mammogram last year that I have personally review showing no evidence of suspicious lesions.  She has not had a mammogram or ultrasound this year. She does have significant comorbidities including COPD, HTN, CAD. Cbc nml, hb 1ac 6.2 CMP nml.   HPI  Past Medical History:  Diagnosis Date  . Allergy   . Allergy to adhesive tape    Allergy to paper tape as well  . Alzheimer disease ([place])   . Anemia   . Anginal pain ([place])   . Anxiety   . Arthritis   . Asthma   . Bronchitis   . Cataract   . Chronic nausea   . Claustrophobia   . DDD (degenerative disc disease), lumbar   . Depression   . Dry eyes    uses Restasis  . Fibromyalgia   . GERD (gastroesophageal reflux disease)   . H/O bladder infections   . Heart murmur   . Hematuria   . Hyperlipidemia   . Hypertension   . Hypothyroidism   . IBS (irritable bowel syndrome)   . Insomnia   . Lumbar neuritis   . Migraines   . Obesity   . Osteoporosis   . Palpitation   . RLS (restless legs syndrome)   . Shoulder fracture, right   . Sicca ([place])   . Tachycardia   . Thyroid disease     Past Surgical History:  Procedure Laterality Date  . ABDOMINAL HYSTERECTOMY     due to cancer of ovaries  . BACK SURGERY N/A 2014   x 5  . BACK SURGERY  12/2017  . CARDIAC CATHETERIZATION     no PCI; 12/18/12 ([facility], Dr. Marcello Moores): EP study with ablation. No coronary angiography done then.  . COLONOSCOPY W/ POLYPECTOMY    . COLONOSCOPY WITH PROPOFOL N/A 02/04/2016   Procedure: COLONOSCOPY WITH PROPOFOL;  Surgeon: Robert Bellow, MD;  Location: [facility] ENDOSCOPY;   Service: Endoscopy;  Laterality: N/A;  . ESOPHAGOGASTRODUODENOSCOPY (EGD) WITH PROPOFOL N/A 02/04/2016   Procedure: ESOPHAGOGASTRODUODENOSCOPY (EGD) WITH PROPOFOL;  Surgeon: Robert Bellow, MD;  Location: [facility] ENDOSCOPY;  Service: Endoscopy;  Laterality: N/A;  . FINGER ARTHROSCOPY WITH CARPOMETACARPEL ([place]) ARTHROPLASTY Left 05/01/2016   Dr. Erlinda Hong  . GIVENS CAPSULE STUDY N/A 05/02/2016   Procedure: GIVENS CAPSULE STUDY;  Surgeon: Manya Silvas, MD;  Location: [facility] ENDOSCOPY;  Service: Endoscopy;  Laterality: N/A;  . HAND SURGERY    . HEMORRHOID SURGERY    . HIP ARTHROPLASTY    . JOINT REPLACEMENT Bilateral    knee  . KNEE ARTHROPLASTY    . REPLACEMENT TOTAL KNEE Left   . SHOULDER ARTHROSCOPY Left 09/09/2018   Procedure: LEFT SHOULDER ARTHROSCOPY, BICEPS TENOTOMY;  Surgeon: Meredith Pel, MD;  Location: [place];  Service: Orthopedics;  Laterality: Left;  . SPINE SURGERY     x 2  . tendonititis     right elbow, right wrist  . TOTAL HIP ARTHROPLASTY Left 2013  . TOTAL HIP ARTHROPLASTY Right 08/16/2015   Procedure: TOTAL HIP ARTHROPLASTY  ANTERIOR APPROACH;  Surgeon: Meredith Pel, MD;  Location: [place];  Service: Orthopedics;  Laterality: Right;  . TOTAL KNEE ARTHROPLASTY Right 03/16/2015   Procedure: RIGHT TOTAL KNEE ARTHROPLASTY, PCL SACRIFICING;  Surgeon: Meredith Pel, MD;  Location: [place];  Service: Orthopedics;  Laterality: Right;    Family History  Problem Relation Age of Onset  . Diabetes Mother   . Hypertension Mother   . Heart disease Mother   . Alzheimer's disease Mother   . Diabetes Father   . Throat cancer Father   . Hypertension Sister   . Depression Sister   . Fibromyalgia Sister   . Cancer Brother   . Heart disease Brother   . Stroke Son   . Fibromyalgia Daughter   . Bladder Cancer Neg Hx   . Kidney disease Neg Hx   . Kidney cancer Neg Hx     Social History Social History   Tobacco Use  . Smoking status: Never Smoker  .  Smokeless tobacco: Never Used  . Tobacco comment: smoking cessation materials not required  Substance Use Topics  . Alcohol use: No    Alcohol/week: 0.0 standard drinks  . Drug use: No    Allergies  Allergen Reactions  . Morphine Nausea And Vomiting    Ok with nausea medication  . Diazepam Nausea And Vomiting  . Lyrica [Pregabalin] Other (See Comments)    Joint swelling  . Metformin And Related     diarrhea  . Latex Rash  . Rash Away  [Petrolatum-Zinc Oxide] Rash and Swelling  . Salicylates Other (See Comments)    Other reaction(s): Headache  . Tape Rash    Please use paper tape    Current Outpatient Medications  Medication Sig Dispense Refill  . aspirin 81 MG chewable tablet Chew 81 mg by mouth daily.    [name] atorvastatin (LIPITOR) 40 MG tablet TAKE 1 TABLET BY MOUTH EVERYDAY AT BEDTIME 90 tablet 0  . baclofen (LIORESAL) 10 MG tablet TAKE ONE TABLET AT 7AM AND ONE TABLET AT 2PM AS NEEDED FOR MUSCLE SPASM 180 tablet 1  . Cholecalciferol (D3-1000) 1000 units tablet Take 1,000 Units by mouth at bedtime.     . diclofenac sodium (VOLTAREN) 1 % GEL Apply topically.    . divalproex (DEPAKOTE) 250 MG DR tablet TAKE 1 TABLET TWICE A DAY 180 tablet 1  . DULERA 200-5 MCG/ACT AERO USE 2 INHALATIONS ORALLY   TWICE DAILY 39 g 0  . estradiol (ESTRACE) 0.1 MG/GM vaginal cream FOR DIRECTIONS ON HOW TO   TAKE THIS MEDICINE, READ   THE ENCLOSED MEDICATION    INFORMATION FORM 42.5 g 3  . gabapentin (NEURONTIN) 300 MG capsule TAKE 1 CAPSULE EVERY MORNING, TAKE 1 CAPSULE AT NOON AND TAKE 2 CAPSULES   (600MG ) IN THE EVENING 360 capsule 0  . hydrOXYzine (VISTARIL) 25 MG capsule Take 1 capsule (25 mg total) by mouth 3 (three) times daily as needed for anxiety. 90 capsule 0  . levalbuterol (XOPENEX) 1.25 MG/3ML nebulizer solution Take 1.25 mg by nebulization every 4 (four) hours as needed for wheezing. 72 mL 4  . levothyroxine (SYNTHROID, LEVOTHROID) 50 MCG tablet Take 50 mcg by mouth daily before  breakfast.    . linaclotide (LINZESS) 145 MCG CAPS capsule Take 1 capsule (145 mcg total) by mouth daily before breakfast. 90 capsule 1  . MAGNESIUM OXIDE PO Take 500 mg by mouth at bedtime. Reported on 07/28/2015    . methocarbamol (ROBAXIN) 500 MG tablet TAKE  ONE TABLET EVERY 8 HOURS AS NEEDED 30 tablet 0  . metoprolol tartrate (LOPRESSOR) 25 MG tablet TAKE 1 TABLET BY MOUTH TWICE A DAY 180 tablet 1  . montelukast (SINGULAIR) 10 MG tablet Take 1 tablet (10 mg total) by mouth at bedtime. 90 tablet 1  . nystatin (MYCOSTATIN) 100000 UNIT/ML suspension Take 5 mLs (500,000 Units total) by mouth 4 (four) times daily. 473 mL 0  . olmesartan-hydrochlorothiazide (BENICAR HCT) 40-12.5 MG tablet Take 1 tablet by mouth daily. 90 tablet 1  . omega-3 acid ethyl esters (LOVAZA) 1 g capsule TAKE 2 CAPSULES (2 GRAMS)  DAILY 180 capsule 3  . ondansetron (ZOFRAN) 4 MG tablet Take 1 tablet (4 mg total) by mouth every 8 (eight) hours as needed for nausea or vomiting. 20 tablet 1  . oxyCODONE (OXY IR/ROXICODONE) 5 MG immediate release tablet Take 5 mg by mouth every 8 (eight) hours as needed.    . polyethylene glycol (MIRALAX / GLYCOLAX) packet Take 17 g by mouth daily as needed.    . RESTASIS 0.05 % ophthalmic emulsion     . valACYclovir (VALTREX) 1000 MG tablet Take 1 tablet (1,000 mg total) by mouth 2 (two) times daily. 20 tablet 0  . venlafaxine XR (EFFEXOR XR) 75 MG 24 hr capsule Take 1 capsule (75 mg total) by mouth daily with breakfast. 90 capsule 1  . vitamin B-12 (CYANOCOBALAMIN) 1000 MCG tablet Take 1,000 mcg by mouth at bedtime.    . lansoprazole (PREVACID) 30 MG capsule Take 1 capsule (30 mg total) daily by mouth. 90 capsule 1   No current facility-administered medications for this visit.      Review of Systems Full ROS  was asked and was negative except for the information on the HPI  Physical Exam Blood pressure (!) 156/75, pulse 87, temperature 97.7 F (36.5 C), resp. rate 16, height 5\' 4"   (1.626 m), weight 209 lb 9.6 oz (95.1 kg), SpO2 97 %. CONSTITUTIONAL: NAD EYES: Pupils are equal, round, and reactive to light, Sclera are non-icteric. EARS, NOSE, MOUTH AND THROAT: The oropharynx is clear. The oral mucosa is pink and moist. Hearing is intact to voice. LYMPH NODES:  Lymph nodes in the neck are normal. RESPIRATORY:  Lungs are clear. There is normal respiratory effort, with equal breath sounds bilaterally, and without pathologic use of accessory muscles. CARDIOVASCULAR: Heart is regular without murmurs, gallops, or rubs. BREAST:  GI: The abdomen is  soft, nontender, and nondistended. There are no palpable masses. There is no hepatosplenomegaly. There are normal bowel sounds in all quadrants. GU: Rectal deferred.   MUSCULOSKELETAL: Normal muscle strength and tone. No cyanosis or edema.   SKIN: Turgor is good and there are no pathologic skin lesions or ulcers. NEUROLOGIC: Motor and sensation is grossly normal. Cranial nerves are grossly intact. PSYCH:  Oriented to person, place and time. Affect is normal.  Data Reviewed  I have personally reviewed the patient's imaging, laboratory findings and medical records.    Assessment/Plan 68 year old female with subcutaneous tissue nodule within the left axilla consistent with epidermal inclusion cyst.  We will obtain appropriate imaging studies to make sure it is not concerning for metastatic disease.  We will get a mammogram and ultrasound.  After appropriate work-up confirms that this is not malignant we can potentially perform an excision under local anesthetic here in the office.  I have discussed with the patient in detail about my thought process and she understands.  Please note that a copy of this report  was sent to the referring provider.  Caroleen Hamman, MD FACS General Surgeon 11/27/2018, 5:56 PM

## 2018-11-28 ENCOUNTER — Ambulatory Visit (INDEPENDENT_AMBULATORY_CARE_PROVIDER_SITE_OTHER): Payer: Medicare Other | Admitting: Rheumatology

## 2018-11-28 ENCOUNTER — Encounter: Payer: Self-pay | Admitting: Rheumatology

## 2018-11-28 ENCOUNTER — Other Ambulatory Visit: Payer: Self-pay

## 2018-11-28 VITALS — BP 116/69 | HR 83 | Resp 20 | Ht 64.0 in | Wt 210.2 lb

## 2018-11-28 DIAGNOSIS — M8589 Other specified disorders of bone density and structure, multiple sites: Secondary | ICD-10-CM

## 2018-11-28 DIAGNOSIS — M7062 Trochanteric bursitis, left hip: Secondary | ICD-10-CM

## 2018-11-28 DIAGNOSIS — Z8659 Personal history of other mental and behavioral disorders: Secondary | ICD-10-CM | POA: Diagnosis not present

## 2018-11-28 DIAGNOSIS — M797 Fibromyalgia: Secondary | ICD-10-CM | POA: Diagnosis not present

## 2018-11-28 DIAGNOSIS — M1812 Unilateral primary osteoarthritis of first carpometacarpal joint, left hand: Secondary | ICD-10-CM

## 2018-11-28 DIAGNOSIS — Z96653 Presence of artificial knee joint, bilateral: Secondary | ICD-10-CM | POA: Diagnosis not present

## 2018-11-28 DIAGNOSIS — Z96643 Presence of artificial hip joint, bilateral: Secondary | ICD-10-CM

## 2018-11-28 DIAGNOSIS — M5136 Other intervertebral disc degeneration, lumbar region: Secondary | ICD-10-CM | POA: Diagnosis not present

## 2018-11-28 DIAGNOSIS — Z8679 Personal history of other diseases of the circulatory system: Secondary | ICD-10-CM

## 2018-11-28 DIAGNOSIS — I209 Angina pectoris, unspecified: Secondary | ICD-10-CM | POA: Diagnosis not present

## 2018-11-28 DIAGNOSIS — M3501 Sicca syndrome with keratoconjunctivitis: Secondary | ICD-10-CM

## 2018-11-28 DIAGNOSIS — M7061 Trochanteric bursitis, right hip: Secondary | ICD-10-CM

## 2018-11-28 MED ORDER — TRIAMCINOLONE ACETONIDE 40 MG/ML IJ SUSP
40.0000 mg | INTRAMUSCULAR | Status: AC | PRN
  Administered 2018-11-28: 40 mg via INTRA_ARTICULAR

## 2018-11-28 MED ORDER — LIDOCAINE HCL 1 % IJ SOLN
1.5000 mL | INTRAMUSCULAR | Status: AC | PRN
  Administered 2018-11-28: 1.5 mL

## 2018-11-28 NOTE — Patient Instructions (Signed)
Back Exercises These exercises help to make your trunk and back strong. They also help to keep the lower back flexible. Doing these exercises can help to prevent back pain or lessen existing pain.  If you have back pain, try to do these exercises 2-3 times each day or as told by your doctor.  As you get better, do the exercises once each day. Repeat the exercises more often as told by your doctor.  To stop back pain from coming back, do the exercises once each day, or as told by your doctor. Exercises Single knee to chest Do these steps 3-5 times in a row for each leg: 1. Lie on your back on a firm bed or the floor with your legs stretched out. 2. Bring one knee to your chest. 3. Grab your knee or thigh with both hands and hold them it in place. 4. Pull on your knee until you feel a gentle stretch in your lower back or buttocks. 5. Keep doing the stretch for 10-30 seconds. 6. Slowly let go of your leg and straighten it. Pelvic tilt Do these steps 5-10 times in a row: 1. Lie on your back on a firm bed or the floor with your legs stretched out. 2. Bend your knees so they point up to the ceiling. Your feet should be flat on the floor. 3. Tighten your lower belly (abdomen) muscles to press your lower back against the floor. This will make your tailbone point up to the ceiling instead of pointing down to your feet or the floor. 4. Stay in this position for 5-10 seconds while you gently tighten your muscles and breathe evenly. Cat-cow Do these steps until your lower back bends more easily: 1. Get on your hands and knees on a firm surface. Keep your hands under your shoulders, and keep your knees under your hips. You may put padding under your knees. 2. Let your head hang down toward your chest. Tighten (contract) the muscles in your belly. Point your tailbone toward the floor so your lower back becomes rounded like the back of a cat. 3. Stay in this position for 5 seconds. 4. Slowly lift your  head. Let the muscles of your belly relax. Point your tailbone up toward the ceiling so your back forms a sagging arch like the back of a cow. 5. Stay in this position for 5 seconds.  Press-ups Do these steps 5-10 times in a row: 1. Lie on your belly (face-down) on the floor. 2. Place your hands near your head, about shoulder-width apart. 3. While you keep your back relaxed and keep your hips on the floor, slowly straighten your arms to raise the top half of your body and lift your shoulders. Do not use your back muscles. You may change where you place your hands in order to make yourself more comfortable. 4. Stay in this position for 5 seconds. 5. Slowly return to lying flat on the floor.  Bridges Do these steps 10 times in a row: 1. Lie on your back on a firm surface. 2. Bend your knees so they point up to the ceiling. Your feet should be flat on the floor. Your arms should be flat at your sides, next to your body. 3. Tighten your butt muscles and lift your butt off the floor until your waist is almost as high as your knees. If you do not feel the muscles working in your butt and the back of your thighs, slide your feet 1-2 inches   farther away from your butt. 4. Stay in this position for 3-5 seconds. 5. Slowly lower your butt to the floor, and let your butt muscles relax. If this exercise is too easy, try doing it with your arms crossed over your chest. Belly crunches Do these steps 5-10 times in a row: 1. Lie on your back on a firm bed or the floor with your legs stretched out. 2. Bend your knees so they point up to the ceiling. Your feet should be flat on the floor. 3. Cross your arms over your chest. 4. Tip your chin a little bit toward your chest but do not bend your neck. 5. Tighten your belly muscles and slowly raise your chest just enough to lift your shoulder blades a tiny bit off of the floor. Avoid raising your body higher than that, because it can put too much stress on your low  back. 6. Slowly lower your chest and your head to the floor. Back lifts Do these steps 5-10 times in a row: 1. Lie on your belly (face-down) with your arms at your sides, and rest your forehead on the floor. 2. Tighten the muscles in your legs and your butt. 3. Slowly lift your chest off of the floor while you keep your hips on the floor. Keep the back of your head in line with the curve in your back. Look at the floor while you do this. 4. Stay in this position for 3-5 seconds. 5. Slowly lower your chest and your face to the floor. Contact a doctor if:  Your back pain gets a lot worse when you do an exercise.  Your back pain does not get better 2 hours after you exercise. If you have any of these problems, stop doing the exercises. Do not do them again unless your doctor says it is okay. Get help right away if:  You have sudden, very bad back pain. If this happens, stop doing the exercises. Do not do them again unless your doctor says it is okay. This information is not intended to replace advice given to you by your health care provider. Make sure you discuss any questions you have with your health care provider. Document Released: 03/18/2010 Document Revised: 11/08/2017 Document Reviewed: 11/08/2017 Elsevier Patient Education  2020 [place]. Hip Bursitis Rehab Ask your health care provider which exercises are safe for you. Do exercises exactly as told by your health care provider and adjust them as directed. It is normal to feel mild stretching, pulling, tightness, or discomfort as you do these exercises. Stop right away if you feel sudden pain or your pain gets worse. Do not begin these exercises until told by your health care provider. Stretching exercise This exercise warms up your muscles and joints and improves the movement and flexibility of your hip. This exercise also helps to relieve pain and stiffness. Iliotibial band stretch An iliotibial band is a strong band of muscle  tissue that runs from the outer side of your hip to the outer side of your thigh and knee. 1. Lie on your side with your left / right leg in the top position. 2. Bend your left / right knee and grab your ankle. Stretch out your bottom arm to help you balance. 3. Slowly bring your knee back so your thigh is behind your body. 4. Slowly lower your knee toward the floor until you feel a gentle stretch on the outside of your left / right thigh. If you do not feel a  stretch and your knee will not fall farther, place the heel of your other foot on top of your knee and pull your knee down toward the floor with your foot. 5. Hold this position for __________ seconds. 6. Slowly return to the starting position. Repeat __________ times. Complete this exercise __________ times a day. Strengthening exercises These exercises build strength and endurance in your hip and pelvis. Endurance is the ability to use your muscles for a long time, even after they get tired. Bridge This exercise strengthens the muscles that move your thigh backward (hip extensors). 1. Lie on your back on a firm surface with your knees bent and your feet flat on the floor. 2. Tighten your buttocks muscles and lift your buttocks off the floor until your trunk is level with your thighs. ? Do not arch your back. ? You should feel the muscles working in your buttocks and the back of your thighs. If you do not feel these muscles, slide your feet 1-2 inches (2.5-5 cm) farther away from your buttocks. ? If this exercise is too easy, try doing it with your arms crossed over your chest. 3. Hold this position for __________ seconds. 4. Slowly lower your hips to the starting position. 5. Let your muscles relax completely after each repetition. Repeat __________ times. Complete this exercise __________ times a day. Squats This exercise strengthens the muscles in front of your thigh and knee (quadriceps). 1. Stand in front of a table, with your feet  and knees pointing straight ahead. You may rest your hands on the table for balance but not for support. 2. Slowly bend your knees and lower your hips like you are going to sit in a chair. ? Keep your weight over your heels, not over your toes. ? Keep your lower legs upright so they are parallel with the table legs. ? Do not let your hips go lower than your knees. ? Do not bend lower than told by your health care provider. ? If your hip pain increases, do not bend as low. 3. Hold the squat position for __________ seconds. 4. Slowly push with your legs to return to standing. Do not use your hands to pull yourself to standing. Repeat __________ times. Complete this exercise __________ times a day. Hip hike 6. Stand sideways on a bottom step. Stand on your left / right leg with your other foot unsupported next to the step. You can hold on to the railing or wall for balance if needed. 7. Keep your knees straight and your torso square. Then lift your left / right hip up toward the ceiling. 8. Hold this position for __________ seconds. 9. Slowly let your left / right hip lower toward the floor, past the starting position. Your foot should get closer to the floor. Do not lean or bend your knees. Repeat __________ times. Complete this exercise __________ times a day. Single leg stand 1. Without shoes, stand near a railing or in a doorway. You may hold on to the railing or door frame as needed for balance. 2. Squeeze your left / right buttock muscles, then lift up your other foot. ? Do not let your left / right hip push out to the side. ? It is helpful to stand in front of a mirror for this exercise so you can watch your hip. 3. Hold this position for __________ seconds. Repeat __________ times. Complete this exercise __________ times a day. This information is not intended to replace advice given to you  by your health care provider. Make sure you discuss any questions you have with your health care  provider. Document Released: 03/23/2004 Document Revised: 06/10/2018 Document Reviewed: 06/10/2018 Elsevier Patient Education  2020 [employer].

## 2018-11-29 ENCOUNTER — Other Ambulatory Visit: Payer: Medicare Other

## 2018-11-30 ENCOUNTER — Other Ambulatory Visit: Payer: Self-pay | Admitting: Family Medicine

## 2018-11-30 DIAGNOSIS — J302 Other seasonal allergic rhinitis: Secondary | ICD-10-CM

## 2018-12-03 ENCOUNTER — Ambulatory Visit: Payer: Medicare Other | Admitting: Orthopaedic Surgery

## 2018-12-04 ENCOUNTER — Other Ambulatory Visit: Payer: Self-pay | Admitting: Rheumatology

## 2018-12-04 ENCOUNTER — Other Ambulatory Visit: Payer: Self-pay | Admitting: Family Medicine

## 2018-12-04 ENCOUNTER — Telehealth: Payer: Self-pay | Admitting: Rheumatology

## 2018-12-04 DIAGNOSIS — E785 Hyperlipidemia, unspecified: Secondary | ICD-10-CM

## 2018-12-04 DIAGNOSIS — M797 Fibromyalgia: Secondary | ICD-10-CM

## 2018-12-04 DIAGNOSIS — B370 Candidal stomatitis: Secondary | ICD-10-CM

## 2018-12-04 MED ORDER — GABAPENTIN 300 MG PO CAPS: ORAL_CAPSULE | ORAL | 0 refills | Status: DC

## 2018-12-04 NOTE — Telephone Encounter (Signed)
Patient called requesting prescription refill of Gabapentin to be sent to [facility].

## 2018-12-04 NOTE — Telephone Encounter (Signed)
Last Visit: 11/28/18 Next Visit: 03/18/19  Okay to refill per. Deveshwar

## 2018-12-06 ENCOUNTER — Ambulatory Visit
Admission: RE | Admit: 2018-12-06 | Discharge: 2018-12-06 | Disposition: A | Discharge status: Home / Self Care | Payer: Medicare Other | Source: Ambulatory Visit | Attending: Family Medicine | Admitting: Family Medicine

## 2018-12-06 DIAGNOSIS — N6320 Unspecified lump in the left breast, unspecified quadrant: Secondary | ICD-10-CM | POA: Diagnosis not present

## 2018-12-06 DIAGNOSIS — R928 Other abnormal and inconclusive findings on diagnostic imaging of breast: Secondary | ICD-10-CM | POA: Diagnosis not present

## 2018-12-06 DIAGNOSIS — N6459 Other signs and symptoms in breast: Secondary | ICD-10-CM

## 2018-12-06 DIAGNOSIS — Z1231 Encounter for screening mammogram for malignant neoplasm of breast: Secondary | ICD-10-CM | POA: Diagnosis not present

## 2018-12-06 DIAGNOSIS — N6002 Solitary cyst of left breast: Secondary | ICD-10-CM | POA: Diagnosis not present

## 2018-12-09 ENCOUNTER — Other Ambulatory Visit: Payer: Self-pay

## 2018-12-09 ENCOUNTER — Encounter: Payer: Self-pay | Admitting: Surgery

## 2018-12-09 ENCOUNTER — Ambulatory Visit (INDEPENDENT_AMBULATORY_CARE_PROVIDER_SITE_OTHER): Payer: Medicare Other | Admitting: Surgery

## 2018-12-09 VITALS — BP 142/80 | HR 86 | Temp 97.7°F | Resp 12 | Ht 64.0 in | Wt 201.6 lb

## 2018-12-09 DIAGNOSIS — I209 Angina pectoris, unspecified: Secondary | ICD-10-CM | POA: Diagnosis not present

## 2018-12-09 DIAGNOSIS — L723 Sebaceous cyst: Secondary | ICD-10-CM | POA: Diagnosis not present

## 2018-12-09 DIAGNOSIS — L720 Epidermal cyst: Secondary | ICD-10-CM | POA: Diagnosis not present

## 2018-12-09 NOTE — Patient Instructions (Addendum)
You may resume bathing in 2 days. Please call the office if you have questions or concerns. Please see your follow up appointment listed  below.

## 2018-12-11 ENCOUNTER — Other Ambulatory Visit: Payer: Self-pay

## 2018-12-11 ENCOUNTER — Emergency Department: Payer: Medicare Other

## 2018-12-11 ENCOUNTER — Ambulatory Visit: Payer: Self-pay | Admitting: *Deleted

## 2018-12-11 ENCOUNTER — Encounter: Payer: Self-pay | Admitting: Emergency Medicine

## 2018-12-11 ENCOUNTER — Emergency Department
Admission: EM | Admit: 2018-12-11 | Discharge: 2018-12-11 | Disposition: A | Discharge status: Home / Self Care | Payer: Medicare Other | Attending: Emergency Medicine | Admitting: Emergency Medicine

## 2018-12-11 DIAGNOSIS — I2510 Atherosclerotic heart disease of native coronary artery without angina pectoris: Secondary | ICD-10-CM | POA: Insufficient documentation

## 2018-12-11 DIAGNOSIS — Z96643 Presence of artificial hip joint, bilateral: Secondary | ICD-10-CM | POA: Diagnosis not present

## 2018-12-11 DIAGNOSIS — E039 Hypothyroidism, unspecified: Secondary | ICD-10-CM | POA: Diagnosis not present

## 2018-12-11 DIAGNOSIS — Z96653 Presence of artificial knee joint, bilateral: Secondary | ICD-10-CM | POA: Diagnosis not present

## 2018-12-11 DIAGNOSIS — R55 Syncope and collapse: Secondary | ICD-10-CM

## 2018-12-11 DIAGNOSIS — J45909 Unspecified asthma, uncomplicated: Secondary | ICD-10-CM | POA: Insufficient documentation

## 2018-12-11 DIAGNOSIS — Z79899 Other long term (current) drug therapy: Secondary | ICD-10-CM | POA: Insufficient documentation

## 2018-12-11 DIAGNOSIS — M25512 Pain in left shoulder: Secondary | ICD-10-CM | POA: Diagnosis not present

## 2018-12-11 DIAGNOSIS — R42 Dizziness and giddiness: Secondary | ICD-10-CM | POA: Diagnosis not present

## 2018-12-11 DIAGNOSIS — M25562 Pain in left knee: Secondary | ICD-10-CM | POA: Diagnosis not present

## 2018-12-11 LAB — CBC WITH DIFFERENTIAL/PLATELET
Abs Immature Granulocytes: 0.06 10*3/uL (ref 0.00–0.07)
Basophils Absolute: 0.1 10*3/uL (ref 0.0–0.1)
Basophils Relative: 1 %
Eosinophils Absolute: 0.1 10*3/uL (ref 0.0–0.5)
Eosinophils Relative: 1 %
HCT: 39.1 % (ref 36.0–46.0)
Hemoglobin: 12.5 g/dL (ref 12.0–15.0)
Immature Granulocytes: 1 %
Lymphocytes Relative: 28 %
Lymphs Abs: 2.4 10*3/uL (ref 0.7–4.0)
MCH: 27.7 pg (ref 26.0–34.0)
MCHC: 32 g/dL (ref 30.0–36.0)
MCV: 86.7 fL (ref 80.0–100.0)
Monocytes Absolute: 0.8 10*3/uL (ref 0.1–1.0)
Monocytes Relative: 10 %
Neutro Abs: 5 10*3/uL (ref 1.7–7.7)
Neutrophils Relative %: 59 %
Platelets: 288 10*3/uL (ref 150–400)
RBC: 4.51 MIL/uL (ref 3.87–5.11)
RDW: 15.7 % — ABNORMAL HIGH (ref 11.5–15.5)
WBC: 8.4 10*3/uL (ref 4.0–10.5)
nRBC: 0 % (ref 0.0–0.2)

## 2018-12-11 LAB — TROPONIN I (HIGH SENSITIVITY): Troponin I (High Sensitivity): 5 ng/L (ref <18)

## 2018-12-11 LAB — BASIC METABOLIC PANEL
Anion gap: 10 (ref 5–15)
BUN: 18 mg/dL (ref 8–23)
CO2: 25 mmol/L (ref 22–32)
Calcium: 9.1 mg/dL (ref 8.9–10.3)
Chloride: 101 mmol/L (ref 98–111)
Creatinine, Ser: 0.88 mg/dL (ref 0.44–1.00)
GFR calc Af Amer: >60 mL/min (ref >60)
GFR calc non Af Amer: >60 mL/min (ref >60)
Glucose, Bld: 108 mg/dL — ABNORMAL HIGH (ref 70–99)
Potassium: 4.2 mmol/L (ref 3.5–5.1)
Sodium: 136 mmol/L (ref 135–145)

## 2018-12-11 LAB — URINALYSIS, COMPLETE (UACMP) WITH MICROSCOPIC
Bacteria, UA: NONE SEEN
Bilirubin Urine: NEGATIVE
Glucose, UA: NEGATIVE mg/dL
Hgb urine dipstick: NEGATIVE
Ketones, ur: NEGATIVE mg/dL
Leukocytes,Ua: NEGATIVE
Nitrite: NEGATIVE
Protein, ur: NEGATIVE mg/dL
Specific Gravity, Urine: 1.015 (ref 1.005–1.030)
WBC, UA: NONE SEEN WBC/hpf (ref 0–5)
pH: 7 (ref 5.0–8.0)

## 2018-12-11 NOTE — ED Provider Notes (Signed)
Patient's work-up has been reassuring.  She feels well no discomfort.  Telemetry is normal.  Doubt cardiac syncope.  No evidence of acute intracranial abnormality.  Repeat neuro exam is normal.  She stable appropriate for outpatient follow-up.   Merlyn Lot, MD 12/11/18 [phone]

## 2018-12-11 NOTE — ED Notes (Signed)
Pt leaving for imaging.

## 2018-12-11 NOTE — ED Notes (Signed)
Pt/family given brief update. 

## 2018-12-11 NOTE — ED Triage Notes (Signed)
Pt reports yesterday she fainted. Pt states unsure of how long she was out. Pt states she woke up on the carpet. Pt reports just some general soreness, no CP, HA or SOB.

## 2018-12-11 NOTE — ED Notes (Signed)
Pt agrees to notify this RN using call bell when she can provide urine sample. Family remain at bedside. Rail up. Bed locked low. Call bell within reach.

## 2018-12-11 NOTE — ED Notes (Signed)
Pt back to room.

## 2018-12-11 NOTE — ED Notes (Addendum)
Pt remembers getting "lightheaded" right before passing out yesterday. Pt states cardiac history but not exactly what. States cardiac doc recently "checked her off." States borderline diabetes. Didn't check sugar yesterday. Family at bedside. EDP Jess[place] at bedside. Attempting for EKG but pt keep moving even post education.

## 2018-12-11 NOTE — Telephone Encounter (Signed)
  Patient states she fainted/pssed out yesterday morning- she had slept in and she fainted while preparing her breakfast. Patient feels tired now and states she has been hearing her heartbeat in her ears since she stopped a medication that her cardiologist told her to stop. ( She is not sure of the name) Per protocol- patient advised to go to ED for evaluation. Reason for Disposition . [1] Age > 50 years  AND [2] now alert and feels fine  Answer Assessment - Initial Assessment Questions 1. ONSET: "How long were you unconscious?" (minutes) "When did it happen?"     Yesterday- patient got up and went to bathroom- had BM- patient came out of bathroom- waked to kitchen and was preparing her breakfast- got dizzy and tried to make it to chair- fell in the floor- left side arm and knee hit. 2. CONTENT: "What happened during period of unconsciousness?" (e.g., seizure activity)      Seconds- can to fairly quickly 3. MENTAL STATUS: "Alert and oriented now?" (oriented x 3 = name, month, location)      Alert and oriented 4. TRIGGER: "What do you think caused the fainting?" "What were you doing just before you fainted?"  (e.g., exercise, sudden standing up, prolonged standing)     Patient thinks she may have been related to absence of medication or sleeping late and not eating. 5. RECURRENT SYMPTOM: "Have you ever passed out before?" If so, ask: "When was the last time?" and "What happened that time?"      Few years ago- after a back surgery- prolonged car ride home from hospital 6. INJURY: "Did you sustain any injury during the fall?"      Hit knee and arm- sore- left 7. CARDIAC SYMPTOMS: "Have you had any of the following symptoms: chest pain, difficulty breathing, palpitations?"     no 8. NEUROLOGIC SYMPTOMS: "Have you had any of the following symptoms: headache, numbness, vertigo, weakness?"     No- slight headcahe 9. GI SYMPTOMS: "Have you had any of the following symptoms: abdominal pain, vomiting,  diarrhea, blood in stools?"     No- IBS 10. OTHER SYMPTOMS: "Do you have any other symptoms?"       No- patient recovered from fall- her daughter came and patient did drink soda- made her feel better 11. PREGNANCY: "Is there any chance you are pregnant?" "When was your last menstrual period?"       n/a  Protocols used: [facility]

## 2018-12-11 NOTE — ED Provider Notes (Signed)
[facility] Emergency Department Provider Note   ____________________________________________   First MD Initiated Contact with Patient 12/11/18 1405     (approximate)  I have reviewed the triage vital signs and the nursing notes.   HISTORY  Chief Complaint Loss of Consciousness    HPI Jacqueline Lucas is a 68 y.o. female with past medical history of SVT, hypertension, GERD presents to the ED complaining of syncope.  Patient reports that yesterday evening she was walking from the kitchen to her living room when she began to feel very lightheaded and like she might pass out.  She is attempted to get to the couch but was not able to do so before she passed out and fell to the ground.  She does not believe she hit her head and thinks she was unconscious only for a few moments.  She denies any head or neck pain, has not had any vision changes, speech changes, numbness, or weakness since then.  She complains of some acute on chronic left knee pain as well as left shoulder pain, but states she has been able to use both extremities without difficulty.  She denies any chest pain or shortness of breath associated with the episode and has otherwise been feeling well with no fevers or cough.        Past Medical History:  Diagnosis Date  . Allergy   . Allergy to adhesive tape    Allergy to paper tape as well  . Alzheimer disease ([place])   . Anemia   . Anginal pain ([place])   . Anxiety   . Arthritis   . Asthma   . Bronchitis   . Cataract   . Chronic nausea   . Claustrophobia   . DDD (degenerative disc disease), lumbar   . Depression   . Dry eyes    uses Restasis  . Fibromyalgia   . GERD (gastroesophageal reflux disease)   . H/O bladder infections   . Heart murmur   . Hematuria   . Hyperlipidemia   . Hypertension   . Hypothyroidism   . IBS (irritable bowel syndrome)   . Insomnia   . Lumbar neuritis   . Migraines   . Obesity   . Osteoporosis   .  Palpitation   . RLS (restless legs syndrome)   . Shoulder fracture, right   . Sicca ([place])   . Tachycardia   . Thyroid disease     Patient Active Problem List   Diagnosis Date Noted  . Superior glenoid labrum lesion of left shoulder   . Lumbar stenosis 02/04/2018  . Hx of hysterectomy 08/13/2017  . Pain in left wrist 06/12/2017  . Chronic venous insufficiency 06/09/2017  . Varicose veins of both lower extremities with pain 04/30/2017  . Claudication ([place]) 04/30/2017  . Arthritis of carpometacarpal [facility]) joint of left thumb 05/12/2016  . Bilateral thumb pain 05/01/2016  . Anemia 02/03/2016  . Status post bilateral total hip replacement 01/10/2016  . Sicca ([place]) 01/10/2016  . Age related osteoporosis 01/10/2016  . Hip arthritis 08/16/2015  . Microscopic hematuria 07/30/2015  . Vaginal atrophy 07/30/2015  . GAD (generalized anxiety disorder) 07/08/2015  . Moderate episode of recurrent major depressive disorder ([place]) 07/08/2015  . Migraine with aura and without status migrainosus, not intractable 07/08/2015  . Dyslipidemia 07/08/2015  . Degenerative arthritis of right knee 03/16/2015  . Atrophic vaginitis 01/31/2015  . Recurrent UTI 12/31/2014  . IBS (irritable bowel syndrome) 10/29/2014  . Asthma, moderate  persistent 09/08/2014  . Allergic state 09/07/2014  . Colon polyp 09/07/2014  . Hemorrhoid 09/07/2014  . Constrictive tenosynovitis 03/12/2014  . H/O total knee replacement, bilateral 07/31/2013  . CAD (coronary artery disease) 11/28/2012  . Anxiety 11/28/2012  . Acid reflux 11/28/2012  . Cardiac murmur 11/28/2012  . BP (high blood pressure) 11/28/2012  . Cannot sleep 11/28/2012  . Adiposity 11/28/2012  . Restless leg 11/28/2012  . Supraventricular tachycardia ([place]) 11/28/2012  . Fibromyalgia 11/28/2012  . Tachycardia 11/28/2012    Past Surgical History:  Procedure Laterality Date  . ABDOMINAL HYSTERECTOMY     due to cancer of ovaries  . BACK SURGERY N/A 2014    x 5  . BACK SURGERY  12/2017  . CARDIAC CATHETERIZATION     no PCI; 12/18/12 ([facility], Dr. Marcello Moores): EP study with ablation. No coronary angiography done then.  . COLONOSCOPY W/ POLYPECTOMY    . COLONOSCOPY WITH PROPOFOL N/A 02/04/2016   Procedure: COLONOSCOPY WITH PROPOFOL;  Surgeon: Robert Bellow, MD;  Location: [facility] ENDOSCOPY;  Service: Endoscopy;  Laterality: N/A;  . ESOPHAGOGASTRODUODENOSCOPY (EGD) WITH PROPOFOL N/A 02/04/2016   Procedure: ESOPHAGOGASTRODUODENOSCOPY (EGD) WITH PROPOFOL;  Surgeon: Robert Bellow, MD;  Location: [facility] ENDOSCOPY;  Service: Endoscopy;  Laterality: N/A;  . FINGER ARTHROSCOPY WITH CARPOMETACARPEL ([place]) ARTHROPLASTY Left 05/01/2016   Dr. Erlinda Hong  . GIVENS CAPSULE STUDY N/A 05/02/2016   Procedure: GIVENS CAPSULE STUDY;  Surgeon: Manya Silvas, MD;  Location: [facility] ENDOSCOPY;  Service: Endoscopy;  Laterality: N/A;  . HAND SURGERY    . HEMORRHOID SURGERY    . HIP ARTHROPLASTY    . JOINT REPLACEMENT Bilateral    knee  . KNEE ARTHROPLASTY    . REPLACEMENT TOTAL KNEE Left   . SHOULDER ARTHROSCOPY Left 09/09/2018   Procedure: LEFT SHOULDER ARTHROSCOPY, BICEPS TENOTOMY;  Surgeon: Meredith Pel, MD;  Location: [place];  Service: Orthopedics;  Laterality: Left;  . SPINE SURGERY     x 2  . tendonititis     right elbow, right wrist  . TOTAL HIP ARTHROPLASTY Left 2013  . TOTAL HIP ARTHROPLASTY Right 08/16/2015   Procedure: TOTAL HIP ARTHROPLASTY ANTERIOR APPROACH;  Surgeon: Meredith Pel, MD;  Location: [place];  Service: Orthopedics;  Laterality: Right;  . TOTAL KNEE ARTHROPLASTY Right 03/16/2015   Procedure: RIGHT TOTAL KNEE ARTHROPLASTY, PCL SACRIFICING;  Surgeon: Meredith Pel, MD;  Location: [place];  Service: Orthopedics;  Laterality: Right;    Prior to Admission medications   Medication Sig Start Date End Date Taking? Authorizing Provider  ALPRAZolam Duanne Moron) 0.25 MG tablet Take by mouth. 03/24/18   [provider]  aspirin 81  MG chewable tablet Chew 81 mg by mouth daily.    [provider]  atorvastatin (LIPITOR) 40 MG tablet TAKE 1 TABLET BY MOUTH EVERYDAY AT BEDTIME 12/04/18   Sowles, Drue Stager, MD  baclofen (LIORESAL) 10 MG tablet TAKE ONE TABLET AT 7AM AND ONE TABLET AT 2PM AS NEEDED FOR MUSCLE SPASM 11/15/18   Bo Merino, MD  Cholecalciferol (D3-1000) 1000 units tablet Take 1,000 Units by mouth at bedtime.     [provider]  diclofenac sodium (VOLTAREN) 1 % GEL Apply topically.    [provider]  divalproex (DEPAKOTE) 250 MG DR tablet TAKE 1 TABLET TWICE A DAY 07/29/18   [name], MD  DULERA 200-5 MCG/ACT AERO USE 2 INHALATIONS ORALLY   TWICE DAILY 04/22/18   Ancil Boozer, Drue Stager, MD  estradiol (ESTRACE) 0.1 MG/GM vaginal cream  FOR DIRECTIONS ON HOW TO   TAKE THIS MEDICINE, READ   THE ENCLOSED MEDICATION    INFORMATION FORM 10/12/17   Zara Council A, PA-C  gabapentin (NEURONTIN) 300 MG capsule TAKE 1 CAPSULE EVERY MORNING, TAKE 1 CAPSULE AT NOON AND TAKE 2 CAPSULES   (600MG ) IN THE EVENING 12/04/18   Bo Merino, MD  hydrOXYzine (VISTARIL) 25 MG capsule Take 1 capsule (25 mg total) by mouth 3 (three) times daily as needed for anxiety. 10/28/18   [name], MD  lansoprazole (PREVACID) 30 MG capsule Take 1 capsule (30 mg total) daily by mouth. 01/11/17 09/03/18  Sowles, Drue Stager, MD  levalbuterol Penne Lash) 1.25 MG/3ML nebulizer solution Take 1.25 mg by nebulization every 4 (four) hours as needed for wheezing. 05/11/17   Hubbard Hartshorn, FNP  levothyroxine (SYNTHROID, LEVOTHROID) 50 MCG tablet Take 50 mcg by mouth daily before breakfast.    [provider]  LINZESS 145 MCG CAPS capsule TAKE 1 CAPSULE DAILY BEFOREBREAKFAST 12/04/18   [name], MD  MAGNESIUM OXIDE PO Take 500 mg by mouth at bedtime. Reported on 07/28/2015    [provider]  methocarbamol (ROBAXIN) 500 MG tablet TAKE ONE TABLET EVERY 8 HOURS AS NEEDED 11/18/18   Meredith Pel, MD   metoprolol tartrate (LOPRESSOR) 25 MG tablet TAKE 1 TABLET BY MOUTH TWICE A DAY 08/22/18   Ancil Boozer, Drue Stager, MD  montelukast (SINGULAIR) 10 MG tablet TAKE 1 TABLET AT BEDTIME 11/30/18   Sowles, Drue Stager, MD  nystatin (MYCOSTATIN) 100000 UNIT/ML suspension TAKE 5 MLS (500,000 UNITS TOTAL) BY MOUTH 4 (FOUR) TIMES DAILY. 12/04/18   [name], MD  olmesartan-hydrochlorothiazide (BENICAR HCT) 40-12.5 MG tablet Take 1 tablet by mouth daily. 10/28/18   [name], MD  omega-3 acid ethyl esters (LOVAZA) 1 g capsule TAKE 2 CAPSULES (2 GRAMS)  DAILY 03/03/18   Ancil Boozer, Drue Stager, MD  ondansetron (ZOFRAN) 4 MG tablet Take 1 tablet (4 mg total) by mouth every 8 (eight) hours as needed for nausea or vomiting. 06/26/18   [name], MD  oxyCODONE (OXY IR/ROXICODONE) 5 MG immediate release tablet Take 5 mg by mouth every 8 (eight) hours as needed. 09/09/18   [provider]  polyethylene glycol (MIRALAX / GLYCOLAX) packet Take 17 g by mouth daily as needed.    [provider]  RESTASIS 0.05 % ophthalmic emulsion  06/03/18   [provider]  valACYclovir (VALTREX) 1000 MG tablet Take 1 tablet (1,000 mg total) by mouth 2 (two) times daily. 10/28/18   [name], MD  venlafaxine XR (EFFEXOR XR) 75 MG 24 hr capsule Take 1 capsule (75 mg total) by mouth daily with breakfast. 10/28/18   [name], MD  vitamin B-12 (CYANOCOBALAMIN) 1000 MCG tablet Take 1,000 mcg by mouth at bedtime.    [provider]  albuterol (PROAIR HFA) 108 (90 BASE) MCG/ACT inhaler Inhale 2 puffs into the lungs every 6 (six) hours as needed for wheezing or shortness of breath. 12/15/14 09/03/18  Ashok Norris, MD    Allergies Morphine, Diazepam, Lyrica [pregabalin], Metformin and related, Latex, Rash away  [petrolatum-zinc oxide], Salicylates, and Tape  Family History  Problem Relation Age of Onset  . Diabetes Mother   . Hypertension Mother   . Heart disease Mother   . Alzheimer's disease Mother    . Diabetes Father   . Throat cancer Father   . Hypertension Sister   . Depression Sister   . Fibromyalgia Sister   . Cancer Brother   . Heart disease  Brother   . Stroke Son   . Fibromyalgia Daughter   . Bladder Cancer Neg Hx   . Kidney disease Neg Hx   . Kidney cancer Neg Hx     Social History Social History   Tobacco Use  . Smoking status: Never Smoker  . Smokeless tobacco: Never Used  . Tobacco comment: smoking cessation materials not required  Substance Use Topics  . Alcohol use: No    Alcohol/week: 0.0 standard drinks  . Drug use: No    Review of Systems  Constitutional: No fever/chills Eyes: No visual changes. ENT: No sore throat. Cardiovascular: Denies chest pain.  Positive for syncope. Respiratory: Denies shortness of breath. Gastrointestinal: No abdominal pain.  No nausea, no vomiting.  No diarrhea.  No constipation. Genitourinary: Negative for dysuria. Musculoskeletal: Negative for back pain. Skin: Negative for rash. Neurological: Negative for headaches, focal weakness or numbness.  ____________________________________________   PHYSICAL EXAM:  VITAL SIGNS: ED Triage Vitals [12/11/18 1402]  Enc Vitals Group     BP      Pulse      Resp      Temp      Temp src      SpO2      Weight 201 lb (91.2 kg)     Height 5\' 4"  (1.626 m)     Head Circumference      Peak Flow      Pain Score 0     Pain Loc      Pain Edu?      Excl. in [place]?     Constitutional: Alert and oriented. Eyes: Conjunctivae are normal. Head: Atraumatic. Nose: No congestion/rhinnorhea. Mouth/Throat: Mucous membranes are moist. Neck: Normal ROM.  No midline cervical spine tenderness. Cardiovascular: Normal rate, regular rhythm. Grossly normal heart sounds. Respiratory: Normal respiratory effort.  No retractions. Lungs CTAB. Gastrointestinal: Soft and nontender. No distention. Genitourinary: deferred Musculoskeletal: No lower extremity tenderness nor edema.  Range of motion  intact to the bilateral upper and lower extremities without pain.  No significant bony tenderness to bilateral upper or lower extremities. Neurologic:  Normal speech and language. No gross focal neurologic deficits are appreciated. Skin:  Skin is warm, dry and intact. No rash noted. Psychiatric: Mood and affect are normal. Speech and behavior are normal.  ____________________________________________   LABS (all labs ordered are listed, but only abnormal results are displayed)  Labs Reviewed  BASIC METABOLIC PANEL - Abnormal; Notable for the following components:      Result Value   Glucose, Bld 108 (*)    All other components within normal limits  CBC WITH DIFFERENTIAL/PLATELET - Abnormal; Notable for the following components:   RDW 15.7 (*)    All other components within normal limits  URINALYSIS, COMPLETE (UACMP) WITH MICROSCOPIC  TROPONIN I (HIGH SENSITIVITY)   ____________________________________________  EKG  ED ECG REPORT I, Blake Divine, the attending physician, personally viewed and interpreted this ECG.   Date: 12/11/2018  EKG Time: 14:13  Rate: 88  Rhythm: normal sinus rhythm  Axis: LAD  Intervals:none  ST&T Change: None    PROCEDURES  Procedure(s) performed (including Critical Care):  Procedures   ____________________________________________   INITIAL IMPRESSION / ASSESSMENT AND PLAN / ED COURSE       68 year old female presents to the ED following syncopal episode with prodromal lightheadedness yesterday evening, no associated chest pain or shortness of breath.  Patient reports feeling fine since then, did not want to seek care but was encouraged to do so  by her PCP.  EKG shows sinus rhythm without any acute changes.  Low suspicion for cardiac etiology of syncope given prodromal symptoms with no chest pain or shortness of breath.  Patient has no history of CHF and is low risk by [facility] syncope rule.  Will screen labs including troponin as well  as chest x-ray, if unremarkable patient would be appropriate for discharge home with PCP follow-up.  No evidence of trauma to head or neck and no significant trauma to extremities.  Patient turned over to Dr. Quentin Cornwall pending additional results.  If these are negative, patient be appropriate for follow-up with PCP as an outpatient.      ____________________________________________   FINAL CLINICAL IMPRESSION(S) / ED DIAGNOSES  Final diagnoses:  Syncope, unspecified syncope type     ED Discharge Orders    None       Note:  This document was prepared using Dragon voice recognition software and may include unintentional dictation errors.   Blake Divine, MD 12/11/18 1505

## 2018-12-11 NOTE — ED Notes (Signed)
EKG completed

## 2018-12-11 NOTE — ED Notes (Signed)
Pt ambulatory to room. Steady.

## 2018-12-12 ENCOUNTER — Encounter: Payer: Self-pay | Admitting: Surgery

## 2018-12-12 DIAGNOSIS — E039 Hypothyroidism, unspecified: Secondary | ICD-10-CM | POA: Diagnosis not present

## 2018-12-12 NOTE — Progress Notes (Signed)
Outpatient Surgical Follow Up  12/12/2018  Jacqueline Lucas is an 68 y.o. female.   Chief Complaint  Patient presents with  . Follow-up    Left axilla mass Mammogram 11/29/2018    HPI:[age]-year-old female well-known to me with an axillary mass and following up after an ultrasound was performed.  I have personally reviewed the ultrasound showing evidence of epidermal inclusion cyst.  No evidence of any other pathological lesions.  He is doing okay but wishes to have this excised given that she is concerned about pathology.  Past Medical History:  Diagnosis Date  . Allergy   . Allergy to adhesive tape    Allergy to paper tape as well  . Alzheimer disease ([place])   . Anemia   . Anginal pain ([place])   . Anxiety   . Arthritis   . Asthma   . Bronchitis   . Cataract   . Chronic nausea   . Claustrophobia   . DDD (degenerative disc disease), lumbar   . Depression   . Dry eyes    uses Restasis  . Fibromyalgia   . GERD (gastroesophageal reflux disease)   . H/O bladder infections   . Heart murmur   . Hematuria   . Hyperlipidemia   . Hypertension   . Hypothyroidism   . IBS (irritable bowel syndrome)   . Insomnia   . Lumbar neuritis   . Migraines   . Obesity   . Osteoporosis   . Palpitation   . RLS (restless legs syndrome)   . Shoulder fracture, right   . Sicca ([place])   . Tachycardia   . Thyroid disease     Past Surgical History:  Procedure Laterality Date  . ABDOMINAL HYSTERECTOMY     due to cancer of ovaries  . BACK SURGERY N/A 2014   x 5  . BACK SURGERY  12/2017  . CARDIAC CATHETERIZATION     no PCI; 12/18/12 ([facility], Dr. Marcello Moores): EP study with ablation. No coronary angiography done then.  . COLONOSCOPY W/ POLYPECTOMY    . COLONOSCOPY WITH PROPOFOL N/A 02/04/2016   Procedure: COLONOSCOPY WITH PROPOFOL;  Surgeon: Robert Bellow, MD;  Location: [facility] ENDOSCOPY;  Service: Endoscopy;  Laterality: N/A;  . ESOPHAGOGASTRODUODENOSCOPY (EGD) WITH PROPOFOL N/A 02/04/2016   Procedure: ESOPHAGOGASTRODUODENOSCOPY (EGD) WITH PROPOFOL;  Surgeon: Robert Bellow, MD;  Location: [facility] ENDOSCOPY;  Service: Endoscopy;  Laterality: N/A;  . FINGER ARTHROSCOPY WITH CARPOMETACARPEL ([place]) ARTHROPLASTY Left 05/01/2016   Dr. Erlinda Hong  . GIVENS CAPSULE STUDY N/A 05/02/2016   Procedure: GIVENS CAPSULE STUDY;  Surgeon: Manya Silvas, MD;  Location: [facility] ENDOSCOPY;  Service: Endoscopy;  Laterality: N/A;  . HAND SURGERY    . HEMORRHOID SURGERY    . HIP ARTHROPLASTY    . JOINT REPLACEMENT Bilateral    knee  . KNEE ARTHROPLASTY    . REPLACEMENT TOTAL KNEE Left   . SHOULDER ARTHROSCOPY Left 09/09/2018   Procedure: LEFT SHOULDER ARTHROSCOPY, BICEPS TENOTOMY;  Surgeon: Meredith Pel, MD;  Location: [place];  Service: Orthopedics;  Laterality: Left;  . SPINE SURGERY     x 2  . tendonititis     right elbow, right wrist  . TOTAL HIP ARTHROPLASTY Left 2013  . TOTAL HIP ARTHROPLASTY Right 08/16/2015   Procedure: TOTAL HIP ARTHROPLASTY ANTERIOR APPROACH;  Surgeon: Meredith Pel, MD;  Location: [place];  Service: Orthopedics;  Laterality: Right;  . TOTAL KNEE ARTHROPLASTY Right 03/16/2015   Procedure: RIGHT TOTAL KNEE ARTHROPLASTY,  PCL SACRIFICING;  Surgeon: Meredith Pel, MD;  Location: [place];  Service: Orthopedics;  Laterality: Right;    Family History  Problem Relation Age of Onset  . Diabetes Mother   . Hypertension Mother   . Heart disease Mother   . Alzheimer's disease Mother   . Diabetes Father   . Throat cancer Father   . Hypertension Sister   . Depression Sister   . Fibromyalgia Sister   . Cancer Brother   . Heart disease Brother   . Stroke Son   . Fibromyalgia Daughter   . Bladder Cancer Neg Hx   . Kidney disease Neg Hx   . Kidney cancer Neg Hx     Social History:  reports that she has never smoked. She has never used smokeless tobacco. She reports that she does not drink alcohol or use drugs.  Allergies:  Allergies  Allergen  Reactions  . Morphine Nausea And Vomiting    Ok with nausea medication  . Lyrica [Pregabalin] Other (See Comments)    Joint swelling  . Metformin And Related     diarrhea  . Diazepam Nausea And Vomiting    Can take with anti-nausea medication  . Latex Rash  . Rash Away  [Petrolatum-Zinc Oxide] Rash and Swelling  . Salicylates Other (See Comments)    Other reaction(s): Headache  . Tape Rash    Please use paper tape    Medications reviewed.    ROS Full ROS performed and is otherwise negative other than what is stated in HPI   BP (!) 142/80   Pulse 86   Temp 97.7 F (36.5 C) (Temporal)   Resp 12   Ht 5\' 4"  (1.626 m)   Wt 201 lb 9.6 oz (91.4 kg)   SpO2 97%   BMI 34.60 kg/m   Physical Exam Vitals signs and nursing note reviewed. Exam conducted with a chaperone present.  Constitutional:      Appearance: Normal appearance.  Neck:     Musculoskeletal: Normal range of motion. No neck rigidity or muscular tenderness.  Cardiovascular:     Rate and Rhythm: Normal rate and regular rhythm.  Pulmonary:     Effort: Pulmonary effort is normal.     Breath sounds: Normal breath sounds.     Comments: Is evidence of a left epidermal inclusion cyst that is mobile measuring approximately 1 cm in size. Abdominal:     General: Abdomen is flat.     Palpations: Abdomen is soft. There is no mass.  Neurological:     Mental Status: She is alert.  Psychiatric:        Mood and Affect: Mood normal.        Behavior: Behavior normal.        Thought Content: Thought content normal.        Judgment: Judgment normal.        No results found for this or any previous visit (from the past 48 hour(s)). Dg Chest 2 View  Result Date: 12/11/2018 CLINICAL DATA:  Syncope EXAM: CHEST - 2 VIEW COMPARISON:  Chest radiograph dated 03/12/2015. FINDINGS: The heart size and mediastinal contours are within normal limits. Both lungs are clear. Chronic deformity of the right humeral head is noted. Lumbar  spine fixation hardware is partially imaged. IMPRESSION: No active cardiopulmonary disease. Electronically Signed   By: Zerita Boers M.D.   On: 12/11/2018 15:00   Ct Head Wo Contrast  Result Date: 12/11/2018 CLINICAL DATA:  68 year old female with history  of syncope. EXAM: CT HEAD WITHOUT CONTRAST TECHNIQUE: Contiguous axial images were obtained from the base of the skull through the vertex without intravenous contrast. COMPARISON:  Head CT 09/11/2017. FINDINGS: Brain: Patchy and confluent areas of decreased attenuation are noted throughout the deep and periventricular white matter of the cerebral hemispheres bilaterally, compatible with chronic microvascular ischemic disease. No evidence of acute infarction, hemorrhage, hydrocephalus, extra-axial collection or mass lesion/mass effect. Vascular: No hyperdense vessel or unexpected calcification. Skull: Normal. Negative for fracture or focal lesion. Sinuses/Orbits: No acute finding. Other: None. IMPRESSION: 1. No acute intracranial abnormalities. 2. Chronic microvascular ischemic changes in the cerebral white matter, as above. Electronically Signed   By: Vinnie Langton M.D.   On: 12/11/2018 15:45    Assessment/Plan:  1. Sebaceous cyst of left axilla discussed with the patient in detail and she wishes to have this excised today.  Greater than 50% of the 25 minutes  visit was spent in counseling/coordination of care  Procedure Note: Excision 11 mm left axillary EIC intermediate layered closure of 12 mm left axillary wound  Anesthesia: lidocaine 1% w epi  EBL: minimal  After informed consent was obtained the patient was prepped and draped in the usual sterile fashion lidocaine was injected and elliptical incision was created to excise the cyst.  The cyst was excised with a 15 blade knife.  Pressure was to obtain good hemostasis.  The wound was closed in a 2 layer fashion with 3-0 Vicryl and 4-0 Monocryl and Dermabond was used to coat the skin.   No complications.  Caroleen Hamman, MD [facility] General Surgeon

## 2018-12-16 ENCOUNTER — Ambulatory Visit: Payer: Medicare Other | Admitting: Family Medicine

## 2018-12-17 ENCOUNTER — Encounter: Payer: Self-pay | Admitting: Orthopaedic Surgery

## 2018-12-17 ENCOUNTER — Ambulatory Visit (INDEPENDENT_AMBULATORY_CARE_PROVIDER_SITE_OTHER): Payer: Medicare Other | Admitting: Orthopaedic Surgery

## 2018-12-17 ENCOUNTER — Other Ambulatory Visit: Payer: Self-pay

## 2018-12-17 DIAGNOSIS — I209 Angina pectoris, unspecified: Secondary | ICD-10-CM

## 2018-12-17 DIAGNOSIS — M654 Radial styloid tenosynovitis [de Quervain]: Secondary | ICD-10-CM | POA: Diagnosis not present

## 2018-12-17 MED ORDER — METHYLPREDNISOLONE ACETATE 40 MG/ML IJ SUSP
13.3300 mg | INTRAMUSCULAR | Status: AC | PRN
  Administered 2018-12-17: 13.33 mg

## 2018-12-17 MED ORDER — BUPIVACAINE HCL 0.25 % IJ SOLN
0.3300 mL | INTRAMUSCULAR | Status: AC | PRN
  Administered 2018-12-17: .33 mL

## 2018-12-17 MED ORDER — DICLOFENAC SODIUM 1 % TD GEL: 2.0000 g | Freq: Four times a day (QID) | TRANSDERMAL | 2 refills | Status: AC

## 2018-12-17 MED ORDER — LIDOCAINE HCL 1 % IJ SOLN
0.3000 mL | INTRAMUSCULAR | Status: AC | PRN
  Administered 2018-12-17: .3 mL

## 2018-12-17 NOTE — Progress Notes (Signed)
Office Visit Note   Patient: Jacqueline Lucas           Date of Birth: Nov 05, 1950           MRN: [id number] Visit Date: 12/17/2018              Requested by: [name], [address] [place],  [place] [address] PCP: [name], MD   Assessment & Plan: Visit Diagnoses:  1. 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  We will inject the first dorsal compartment with cortisone today.  She will follow-up with [place] as needed.  Call with concerns or questions in the meantime.  Follow-Up Instructions: Return if symptoms worsen or fail to improve.   Orders:  Orders Placed This Encounter  Procedures  . Hand/UE Inj: R extensor compartment 1   No orders of the defined types were placed in this encounter.     Procedures: Hand/UE Inj: R extensor compartment 1 for de Quervain's tenosynovitis on 12/17/2018 1:16 PM Medications: 0.33 mL bupivacaine 0.25 %; 0.3 mL lidocaine 1 %; 13.33 mg methylPREDNISolone acetate 40 MG/ML      Clinical Data: No additional findings.   Subjective: Chief Complaint  Patient presents with  . Right Hand - Pain    HPI patient is a pleasant 68 year old female who presents to our clinic today with right wrist pain.  This has been ongoing for the past few years and is recently worsened.  Pain she has is over the first dorsal compartment and occasionally into the base of the thumb.  Pain is worse with any motion of the thumb.  She does have a history of left wrist de Quervain's tenosynovitis which was injected little over a year ago with great relief of symptoms.  She says this feels very similar.  She would like her right wrist injected today if possible.  Review of Systems as detailed in HPI.  All others reviewed and are negative.   Objective: Vital Signs: There were no vitals taken for this visit.  Physical Exam  well-developed and well-nourished female in no acute distress.  Alert and oriented x3.  Ortho Exam examination of her right wrist reveals moderate tenderness over the first dorsal compartment.  She does have a positive grind.  Positive Finkelstein.  She is neurovascularly intact distally.  Specialty Comments:  No specialty comments available.  Imaging: No new imaging   PMFS History: Patient Active Problem List   Diagnosis Date Noted  . Superior glenoid labrum lesion of left shoulder   . Lumbar stenosis 02/04/2018  . Hx of hysterectomy 08/13/2017  . Pain in left wrist 06/12/2017  . Chronic venous insufficiency 06/09/2017  . Varicose veins of both lower extremities with pain 04/30/2017  . Claudication ([place]) 04/30/2017  . Arthritis of carpometacarpal [facility]) joint of left thumb 05/12/2016  . Bilateral thumb pain 05/01/2016  . Anemia 02/03/2016  . Status post bilateral total hip replacement 01/10/2016  . Sicca ([place]) 01/10/2016  . Age related osteoporosis 01/10/2016  . Hip arthritis 08/16/2015  . Microscopic hematuria 07/30/2015  . Vaginal atrophy 07/30/2015  . GAD (generalized anxiety disorder) 07/08/2015  . Moderate episode of recurrent major depressive disorder ([place]) 07/08/2015  . Migraine with aura and without status migrainosus, not intractable 07/08/2015  . Dyslipidemia 07/08/2015  . Degenerative arthritis of right knee 03/16/2015  . Atrophic vaginitis 01/31/2015  . Recurrent UTI 12/31/2014  .  IBS (irritable bowel syndrome) 10/29/2014  . Asthma, moderate persistent 09/08/2014  . Allergic state 09/07/2014  . Colon polyp 09/07/2014  . Hemorrhoid 09/07/2014  . Constrictive tenosynovitis 03/12/2014  . H/O total knee replacement, bilateral 07/31/2013  . CAD (coronary artery disease) 11/28/2012  . Anxiety 11/28/2012  . Acid reflux 11/28/2012  . Cardiac murmur 11/28/2012  . BP (high blood pressure) 11/28/2012  . Cannot sleep 11/28/2012  . Adiposity 11/28/2012  . Restless  leg 11/28/2012  . Supraventricular tachycardia ([place]) 11/28/2012  . Fibromyalgia 11/28/2012  . Tachycardia 11/28/2012   Past Medical History:  Diagnosis Date  . Allergy   . Allergy to adhesive tape    Allergy to paper tape as well  . Alzheimer disease ([place])   . Anemia   . Anginal pain ([place])   . Anxiety   . Arthritis   . Asthma   . Bronchitis   . Cataract   . Chronic nausea   . Claustrophobia   . DDD (degenerative disc disease), lumbar   . Depression   . Dry eyes    uses Restasis  . Fibromyalgia   . GERD (gastroesophageal reflux disease)   . H/O bladder infections   . Heart murmur   . Hematuria   . Hyperlipidemia   . Hypertension   . Hypothyroidism   . IBS (irritable bowel syndrome)   . Insomnia   . Lumbar neuritis   . Migraines   . Obesity   . Osteoporosis   . Palpitation   . RLS (restless legs syndrome)   . Shoulder fracture, right   . Sicca ([place])   . Tachycardia   . Thyroid disease     Family History  Problem Relation Age of Onset  . Diabetes Mother   . Hypertension Mother   . Heart disease Mother   . Alzheimer's disease Mother   . Diabetes Father   . Throat cancer Father   . Hypertension Sister   . Depression Sister   . Fibromyalgia Sister   . Cancer Brother   . Heart disease Brother   . Stroke Son   . Fibromyalgia Daughter   . Bladder Cancer Neg Hx   . Kidney disease Neg Hx   . Kidney cancer Neg Hx     Past Surgical History:  Procedure Laterality Date  . ABDOMINAL HYSTERECTOMY     due to cancer of ovaries  . BACK SURGERY N/A 2014   x 5  . BACK SURGERY  12/2017  . CARDIAC CATHETERIZATION     no PCI; 12/18/12 ([facility], Dr. Marcello Moores): EP study with ablation. No coronary angiography done then.  . COLONOSCOPY W/ POLYPECTOMY    . COLONOSCOPY WITH PROPOFOL N/A 02/04/2016   Procedure: COLONOSCOPY WITH PROPOFOL;  Surgeon: Robert Bellow, MD;  Location[facility] END[facility]COPY;  Service: Endoscopy;  Laterality: N/A;  . ESOPHAGOGASTRODUODENOSCOPY (EGD) WITH  PROPOFOL N/A 02/04/2016   Procedure: ESOPHAGOGASTRODUODENOSCOPY (EGD) WITH PROPOFOL;  Surgeon: Robert Bellow, MD;  Location: [facility] ENDOSCOPY;  Service: Endoscopy;  Laterality: N/A;  . FINGER ARTHROSCOPY WITH CARPOMETACARPEL ([place]) ARTHROPLASTY Left 05/01/2016   Dr. Erlinda Hong  . GIVENS CAPSULE STUDY N/A 05/02/2016   Procedure: GIVENS CAPSULE STUDY;  Surgeon: Manya Silvas, MD;  Location: [facility] ENDOSCOPY;  Service: Endoscopy;  Laterality: N/A;  . HAND SURGERY    . HEMORRHOID SURGERY    . HIP ARTHROPLASTY    . JOINT REPLACEMENT Bilateral    knee  . KNEE ARTHROPLASTY    . REPLACEMENT TOTAL KNEE Left   .  SHOULDER ARTHROSCOPY Left 09/09/2018   Procedure: LEFT SHOULDER ARTHROSCOPY, BICEPS TENOTOMY;  Surgeon: Meredith Pel, MD;  Location: [place];  Service: Orthopedics;  Laterality: Left;  . SPINE SURGERY     x 2  . tendonititis     right elbow, right wrist  . TOTAL HIP ARTHROPLASTY Left 2013  . TOTAL HIP ARTHROPLASTY Right 08/16/2015   Procedure: TOTAL HIP ARTHROPLASTY ANTERIOR APPROACH;  Surgeon: Meredith Pel, MD;  Location: [place];  Service: Orthopedics;  Laterality: Right;  . TOTAL KNEE ARTHROPLASTY Right 03/16/2015   Procedure: RIGHT TOTAL KNEE ARTHROPLASTY, PCL SACRIFICING;  Surgeon: Meredith Pel, MD;  Location: [place];  Service: Orthopedics;  Laterality: Right;   Social History   Occupational History  . Occupation: Retired  Tobacco Use  . Smoking status: Never Smoker  . Smokeless tobacco: Never Used  . Tobacco comment: smoking cessation materials not required  Substance and Sexual Activity  . Alcohol use: No    Alcohol/week: 0.0 standard drinks  . Drug use: No  . Sexual activity: Never

## 2018-12-18 DIAGNOSIS — E041 Nontoxic single thyroid nodule: Secondary | ICD-10-CM | POA: Diagnosis not present

## 2018-12-18 DIAGNOSIS — E039 Hypothyroidism, unspecified: Secondary | ICD-10-CM | POA: Diagnosis not present

## 2018-12-19 ENCOUNTER — Other Ambulatory Visit: Payer: Self-pay | Admitting: Unknown Physician Specialty

## 2018-12-19 DIAGNOSIS — H93A9 Pulsatile tinnitus, unspecified ear: Secondary | ICD-10-CM

## 2018-12-19 DIAGNOSIS — H93293 Other abnormal auditory perceptions, bilateral: Secondary | ICD-10-CM | POA: Diagnosis not present

## 2018-12-19 DIAGNOSIS — H93A3 Pulsatile tinnitus, bilateral: Secondary | ICD-10-CM | POA: Diagnosis not present

## 2018-12-22 ENCOUNTER — Other Ambulatory Visit: Payer: Self-pay | Admitting: Family Medicine

## 2018-12-22 DIAGNOSIS — F3160 Bipolar disorder, current episode mixed, unspecified: Secondary | ICD-10-CM

## 2018-12-22 NOTE — Telephone Encounter (Signed)
Requested medication (s) are due for refill today: no  Requested medication (s) are on the active medication list: yes  Last refill:  07/29/18  Future visit scheduled: yes  Notes to clinic:  Med refill not delegated to NT to refill   Requested Prescriptions  Pending Prescriptions Disp Refills   divalproex (DEPAKOTE) 250 MG DR tablet [Pharmacy Med Name: DIVALPROEX TAB 250MG  DR] 180 tablet 1    Sig: TAKE 1 TABLET TWICE A DAY     Not Delegated - Neurology:  Anticonvulsants - Valproates Failed - 12/22/2018  6:12 AM      Failed - This refill cannot be delegated      Failed - Valproic Acid (serum) in normal range and within 360 days    No results found for: VALPROATE       Passed - AST in normal range and within 360 days    AST  Date Value Ref Range Status  10/28/2018 11 10 - 35 U/L Final         Passed - ALT in normal range and within 360 days    ALT  Date Value Ref Range Status  10/28/2018 9 6 - 29 U/L Final         Passed - HGB in normal range and within 360 days    Hemoglobin  Date Value Ref Range Status  12/11/2018 12.5 12.0 - 15.0 g/dL Final  06/08/2016 13.6 11.1 - 15.9 g/dL Final         Passed - PLT in normal range and within 360 days    Platelets  Date Value Ref Range Status  12/11/2018 288 150 - 400 K/uL Final  06/08/2016 226 150 - 379 x10E3/uL Final         Passed - WBC in normal range and within 360 days    WBC  Date Value Ref Range Status  12/11/2018 8.4 4.0 - 10.5 K/uL Final         Passed - HCT in normal range and within 360 days    HCT  Date Value Ref Range Status  12/11/2018 39.1 36.0 - 46.0 % Final   Hematocrit  Date Value Ref Range Status  06/08/2016 40.5 34.0 - 46.6 % Final         Passed - Valid encounter within last 12 months    Recent Outpatient Visits          1 month ago Mass of left axilla   [facility] [name], MD   4 months ago Other constipation   [facility] [name], MD    5 months ago Recurrent cystitis   [facility] [place], FNP   5 months ago Asthma with COPD [facility])   [facility] [name], MD   8 months ago Burning with urination   [place], NP      Future Appointments            Tomorrow [name], MD [facility], [place]   In 1 month [name], [facility], [place]   In 2 months Bo Merino, MD [place]   In 5 months Bo Merino, MD [place]   In 9 months  [facility], [facility]             Requested Prescriptions  Pending Prescriptions Disp Refills   divalproex (DEPAKOTE) 250 MG DR  tablet [Pharmacy Med Name: DIVALPROEX TAB 250MG  DR] 180 tablet 1    Sig: TAKE 1 TABLET TWICE A DAY     Not Delegated - Neurology:  Anticonvulsants - Valproates Failed - 12/22/2018  6:12 AM      Failed - This refill cannot be delegated      Failed - Valproic Acid (serum) in normal range and within 360 days    No results found for: VALPROATE       Passed - AST in normal range and within 360 days    AST  Date Value Ref Range Status  10/28/2018 11 10 - 35 U/L Final         Passed - ALT in normal range and within 360 days    ALT  Date Value Ref Range Status  10/28/2018 9 6 - 29 U/L Final         Passed - HGB in normal range and within 360 days    Hemoglobin  Date Value Ref Range Status  12/11/2018 12.5 12.0 - 15.0 g/dL Final  06/08/2016 13.6 11.1 - 15.9 g/dL Final         Passed - PLT in normal range and within 360 days    Platelets  Date Value Ref Range Status  12/11/2018 288 150 - 400 K/uL Final  06/08/2016 226 150 - 379 x10E3/uL Final         Passed - WBC in normal range and within 360 days    WBC  Date Value Ref Range Status  12/11/2018 8.4 4.0 - 10.5 K/uL Final         Passed - HCT in normal range and within 360  days    HCT  Date Value Ref Range Status  12/11/2018 39.1 36.0 - 46.0 % Final   Hematocrit  Date Value Ref Range Status  06/08/2016 40.5 34.0 - 46.6 % Final         Passed - Valid encounter within last 12 months    Recent Outpatient Visits          1 month ago Mass of left axilla   [facility] [name], MD   4 months ago Other constipation   [facility] [name], MD   5 months ago Recurrent cystitis   [place], [place], FNP   5 months ago Asthma with COPD [facility])   [facility] [name], MD   8 months ago Burning with urination   [place], NP      Future Appointments            Tomorrow [name], MD [facility], [place]   In 1 month [name], MD [facility], [place]   In 2 months Bo Merino, MD [place]   In 5 months Bo Merino, MD [place]   In 9 months  [facility], [facility]

## 2018-12-23 ENCOUNTER — Other Ambulatory Visit: Payer: Self-pay

## 2018-12-23 ENCOUNTER — Ambulatory Visit (INDEPENDENT_AMBULATORY_CARE_PROVIDER_SITE_OTHER): Payer: Medicare Other | Admitting: Family Medicine

## 2018-12-23 ENCOUNTER — Encounter: Payer: Self-pay | Admitting: Family Medicine

## 2018-12-23 VITALS — BP 102/58 | HR 93 | Temp 96.9°F | Resp 16 | Ht 64.0 in | Wt 202.7 lb

## 2018-12-23 DIAGNOSIS — I10 Essential (primary) hypertension: Secondary | ICD-10-CM

## 2018-12-23 DIAGNOSIS — Z87898 Personal history of other specified conditions: Secondary | ICD-10-CM | POA: Diagnosis not present

## 2018-12-23 DIAGNOSIS — I209 Angina pectoris, unspecified: Secondary | ICD-10-CM

## 2018-12-23 MED ORDER — OLMESARTAN MEDOXOMIL-HCTZ 40-12.5 MG PO TABS: 0.5000 | ORAL_TABLET | Freq: Every day | ORAL | 0 refills | Status: DC

## 2018-12-23 NOTE — Progress Notes (Signed)
Name: Jacqueline Lucas   MRN: [id number]    DOB: 1951-01-09   Date:12/23/2018       Progress Note  Subjective  Chief Complaint  Chief Complaint  Patient presents with  . Hospitalization Follow-up  . Syncopy    HPI  Episode of syncope episode: she states she woke up and went to the bathroom, opened the blinds of her house, opened the garage door. When she got in the kitchen to make her breakfast she felt lightheaded. She went into the living room to try to sit down, but she did not make it to the living room and she woke up on the floor, laying on her left side. She thinks she was out for a few seconds , she did not void but had some stools on her underwear. She called her daughter while on the floor and she took her to [facility]. She denies any neuro deficit or chest pain. She had labs , EKG and CT done on 12/11/2018 . She has also noticed tinnitus about one month ago and is now seeing ENT   Patient Active Problem List   Diagnosis Date Noted  . Superior glenoid labrum lesion of left shoulder   . Lumbar stenosis 02/04/2018  . Hx of hysterectomy 08/13/2017  . Pain in left wrist 06/12/2017  . Chronic venous insufficiency 06/09/2017  . Varicose veins of both lower extremities with pain 04/30/2017  . Claudication ([place]) 04/30/2017  . Arthritis of carpometacarpal [facility]) joint of left thumb 05/12/2016  . Bilateral thumb pain 05/01/2016  . Anemia 02/03/2016  . Status post bilateral total hip replacement 01/10/2016  . Sicca ([place]) 01/10/2016  . Age related osteoporosis 01/10/2016  . Hip arthritis 08/16/2015  . Microscopic hematuria 07/30/2015  . Vaginal atrophy 07/30/2015  . GAD (generalized anxiety disorder) 07/08/2015  . Moderate episode of recurrent major depressive disorder ([place]) 07/08/2015  . Migraine with aura and without status migrainosus, not intractable 07/08/2015  . Dyslipidemia 07/08/2015  . Degenerative arthritis of right knee 03/16/2015  . Atrophic vaginitis 01/31/2015  .  Recurrent UTI 12/31/2014  . IBS (irritable bowel syndrome) 10/29/2014  . Asthma, moderate persistent 09/08/2014  . Allergic state 09/07/2014  . Colon polyp 09/07/2014  . Hemorrhoid 09/07/2014  . Constrictive tenosynovitis 03/12/2014  . H/O total knee replacement, bilateral 07/31/2013  . CAD (coronary artery disease) 11/28/2012  . Anxiety 11/28/2012  . Acid reflux 11/28/2012  . Cardiac murmur 11/28/2012  . BP (high blood pressure) 11/28/2012  . Cannot sleep 11/28/2012  . Adiposity 11/28/2012  . Restless leg 11/28/2012  . Supraventricular tachycardia ([place]) 11/28/2012  . Fibromyalgia 11/28/2012  . Tachycardia 11/28/2012    Past Surgical History:  Procedure Laterality Date  . ABDOMINAL HYSTERECTOMY     due to cancer of ovaries  . BACK SURGERY N/A 2014   x 5  . BACK SURGERY  12/2017  . CARDIAC CATHETERIZATION     no PCI; 12/18/12 ([facility], Dr. Marcello Moores): EP study with ablation. No coronary angiography done then.  . COLONOSCOPY W/ POLYPECTOMY    . COLONOSCOPY WITH PROPOFOL N/A 02/04/2016   Procedure: COLONOSCOPY WITH PROPOFOL;  Surgeon: Robert Bellow, MD;  Location: [facility] ENDOSCOPY;  Service: Endoscopy;  Laterality: N/A;  . ESOPHAGOGASTRODUODENOSCOPY (EGD) WITH PROPOFOL N/A 02/04/2016   Procedure: ESOPHAGOGASTRODUODENOSCOPY (EGD) WITH PROPOFOL;  Surgeon: Robert Bellow, MD;  Location: [facility] ENDOSCOPY;  Service: Endoscopy;  Laterality: N/A;  . FINGER ARTHROSCOPY WITH CARPOMETACARPEL ([place]) ARTHROPLASTY Left 05/01/2016   Dr. Erlinda Hong  . GIVENS CAPSULE STUDY  N/A 05/02/2016   Procedure: GIVENS CAPSULE STUDY;  Surgeon: Manya Silvas, MD;  Location: [facility] ENDOSCOPY;  Service: Endoscopy;  Laterality: N/A;  . HAND SURGERY    . HEMORRHOID SURGERY    . HIP ARTHROPLASTY    . JOINT REPLACEMENT Bilateral    knee  . KNEE ARTHROPLASTY    . REPLACEMENT TOTAL KNEE Left   . SHOULDER ARTHROSCOPY Left 09/09/2018   Procedure: LEFT SHOULDER ARTHROSCOPY, BICEPS TENOTOMY;  Surgeon: Meredith Pel, MD;   Location: [place];  Service: Orthopedics;  Laterality: Left;  . SPINE SURGERY     x 2  . tendonititis     right elbow, right wrist  . TOTAL HIP ARTHROPLASTY Left 2013  . TOTAL HIP ARTHROPLASTY Right 08/16/2015   Procedure: TOTAL HIP ARTHROPLASTY ANTERIOR APPROACH;  Surgeon: Meredith Pel, MD;  Location: [place];  Service: Orthopedics;  Laterality: Right;  . TOTAL KNEE ARTHROPLASTY Right 03/16/2015   Procedure: RIGHT TOTAL KNEE ARTHROPLASTY, PCL SACRIFICING;  Surgeon: Meredith Pel, MD;  Location: [place];  Service: Orthopedics;  Laterality: Right;    Family History  Problem Relation Age of Onset  . Diabetes Mother   . Hypertension Mother   . Heart disease Mother   . Alzheimer's disease Mother   . Diabetes Father   . Throat cancer Father   . Hypertension Sister   . Depression Sister   . Fibromyalgia Sister   . Cancer Brother   . Heart disease Brother   . Stroke Son   . Fibromyalgia Daughter   . Bladder Cancer Neg Hx   . Kidney disease Neg Hx   . Kidney cancer Neg Hx     Social History   Socioeconomic History  . Marital status: Widowed    Spouse name: Fritz Pickerel  . Number of children: 4  . Years of education: some college  . Highest education level: 12th grade  Occupational History  . Occupation: Retired  Scientific laboratory technician  . Financial resource strain: Not hard at all  . Food insecurity    Worry: Never true    Inability: Never true  . Transportation needs    Medical: No    Non-medical: No  Tobacco Use  . Smoking status: Never Smoker  . Smokeless tobacco: Never Used  . Tobacco comment: smoking cessation materials not required  Substance and Sexual Activity  . Alcohol use: No    Alcohol/week: 0.0 standard drinks  . Drug use: No  . Sexual activity: Never  Lifestyle  . Physical activity    Days per week: 3 days    Minutes per session: 30 min  . Stress: To some extent  Relationships  . Social connections    Talks on phone: More than three times a  week    Gets together: Twice a week    Attends religious service: 1 to 4 times per year    Active member of club or organization: No    Attends meetings of clubs or organizations: Never    Relationship status: Married  . Intimate partner violence    Fear of current or ex partner: No    Emotionally abused: No    Physically abused: No    Forced sexual activity: No  Other Topics Concern  . Not on file  Social History Narrative   Patient has 10 grandchildren, 4 great-granddaughters & 2 great-grandsons.   Widow since 02/2018 - married for 42 years     Current Outpatient Medications:  .  ALPRAZolam (XANAX) 0.25 MG tablet, Take by mouth., Disp: , Rfl:  .  aspirin 81 MG chewable tablet, Chew 81 mg by mouth daily., Disp: , Rfl:  .  atorvastatin (LIPITOR) 40 MG tablet, TAKE 1 TABLET BY MOUTH EVERYDAY AT BEDTIME, Disp: 90 tablet, Rfl: 0 .  baclofen (LIORESAL) 10 MG tablet, TAKE ONE TABLET AT 7AM AND ONE TABLET AT 2PM AS NEEDED FOR MUSCLE SPASM, Disp: 180 tablet, Rfl: 1 .  Cholecalciferol (D3-1000) 1000 units tablet, Take 1,000 Units by mouth at bedtime. , Disp: , Rfl:  .  diclofenac sodium (VOLTAREN) 1 % GEL, Apply 2 g topically 4 (four) times daily., Disp: 150 g, Rfl: 2 .  divalproex (DEPAKOTE) 250 MG DR tablet, TAKE 1 TABLET TWICE A DAY, Disp: 180 tablet, Rfl: 1 .  DULERA 200-5 MCG/ACT AERO, USE 2 INHALATIONS ORALLY   TWICE DAILY, Disp: 39 g, Rfl: 0 .  estradiol (ESTRACE) 0.1 MG/GM vaginal cream, FOR DIRECTIONS ON HOW TO   TAKE THIS MEDICINE, READ   THE ENCLOSED MEDICATION    INFORMATION FORM, Disp: 42.5 g, Rfl: 3 .  gabapentin (NEURONTIN) 300 MG capsule, TAKE 1 CAPSULE EVERY MORNING, TAKE 1 CAPSULE AT NOON AND TAKE 2 CAPSULES   (600MG ) IN THE EVENING (Patient taking differently: Take 300-600 mg by mouth 3 (three) times daily. TAKE 1 CAPSULE EVERY MORNING, TAKE 1 CAPSULE AT NOON AND TAKE 2 CAPSULES   (600MG ) IN THE EVENING), Disp: 360 capsule, Rfl: 0 .  hydrOXYzine (VISTARIL) 25 MG capsule, Take  1 capsule (25 mg total) by mouth 3 (three) times daily as needed for anxiety., Disp: 90 capsule, Rfl: 0 .  levalbuterol (XOPENEX) 1.25 MG/3ML nebulizer solution, Take 1.25 mg by nebulization every 4 (four) hours as needed for wheezing., Disp: 72 mL, Rfl: 4 .  levothyroxine (SYNTHROID, LEVOTHROID) 50 MCG tablet, Take 50 mcg by mouth daily before breakfast., Disp: , Rfl:  .  LINZESS 145 MCG CAPS capsule, TAKE 1 CAPSULE DAILY BEFOREBREAKFAST, Disp: 90 capsule, Rfl: 1 .  MAGNESIUM OXIDE PO, Take 400 mg by mouth at bedtime. Reported on 07/28/2015, Disp: , Rfl:  .  montelukast (SINGULAIR) 10 MG tablet, TAKE 1 TABLET AT BEDTIME, Disp: 90 tablet, Rfl: 1 .  nystatin (MYCOSTATIN) 100000 UNIT/ML suspension, TAKE 5 MLS (500,000 UNITS TOTAL) BY MOUTH 4 (FOUR) TIMES DAILY., Disp: 480 mL, Rfl: 0 .  olmesartan-hydrochlorothiazide (BENICAR HCT) 40-12.5 MG tablet, Take 0.5 tablets by mouth daily., Disp: 30 tablet, Rfl: 0 .  omega-3 acid ethyl esters (LOVAZA) 1 g capsule, TAKE 2 CAPSULES (2 GRAMS)  DAILY, Disp: 180 capsule, Rfl: 3 .  ondansetron (ZOFRAN) 4 MG tablet, Take 1 tablet (4 mg total) by mouth every 8 (eight) hours as needed for nausea or vomiting., Disp: 20 tablet, Rfl: 1 .  oxyCODONE (OXY IR/ROXICODONE) 5 MG immediate release tablet, Take 5 mg by mouth every 8 (eight) hours as needed., Disp: , Rfl:  .  RESTASIS 0.05 % ophthalmic emulsion, Place 1 drop into both eyes 2 (two) times daily. , Disp: , Rfl:  .  valACYclovir (VALTREX) 1000 MG tablet, Take 1 tablet (1,000 mg total) by mouth 2 (two) times daily., Disp: 20 tablet, Rfl: 0 .  venlafaxine XR (EFFEXOR XR) 75 MG 24 hr capsule, Take 1 capsule (75 mg total) by mouth daily with breakfast., Disp: 90 capsule, Rfl: 1 .  vitamin B-12 (CYANOCOBALAMIN) 1000 MCG tablet, Take 1,000 mcg by mouth at bedtime., Disp: , Rfl:  .  lansoprazole (PREVACID) 30 MG capsule, Take  1 capsule (30 mg total) daily by mouth. (Patient taking differently: Take 30 mg by mouth 2 (two) times  daily before a meal. ), Disp: 90 capsule, Rfl: 1 .  polyethylene glycol (MIRALAX / GLYCOLAX) packet, Take 17 g by mouth daily as needed., Disp: , Rfl:   Allergies  Allergen Reactions  . Morphine Nausea And Vomiting    Ok with nausea medication  . Lyrica [Pregabalin] Other (See Comments)    Joint swelling  . Metformin And Related     diarrhea  . Diazepam Nausea And Vomiting    Can take with anti-nausea medication  . Latex Rash  . Rash Away  [Petrolatum-Zinc Oxide] Rash and Swelling  . Salicylates Other (See Comments)    Other reaction(s): Headache  . Tape Rash    Please use paper tape    I personally reviewed active problem list, medication list, allergies, family history, social history, health maintenance with the patient/caregiver today.   ROS  Constitutional: Negative for fever or weight change.  Respiratory: Negative for cough and shortness of breath.   Cardiovascular: Negative for chest pain or palpitations.  Gastrointestinal: Negative for abdominal pain, no bowel changes.  Musculoskeletal: Negative for gait problem or joint swelling.  Skin: Negative for rash.  Neurological: Negative for dizziness or headache. Positive for tinnitus seeing ENT No other specific complaints in a complete review of systems (except as listed in HPI above).   Objective  Vitals:   12/23/18 1419  BP: (!) 102/58  Pulse: 93  Resp: 16  Temp: (!) 96.9 F (36.1 C)  TempSrc: Temporal  SpO2: 96%  Weight: 202 lb 11.2 oz (91.9 kg)  Height: 5\' 4"  (1.626 m)    Body mass index is 34.79 kg/m.  Physical Exam  Constitutional: Patient appears well-developed and well-nourished. Obese No distress.  HEENT: head atraumatic, normocephalic, pupils equal and reactive to light, Cardiovascular: Normal rate, regular rhythm and normal heart sounds.  No murmur heard. No BLE edema. Pulmonary/Chest: Effort normal and breath sounds normal. No respiratory distress. Abdominal: Soft.  There is no  tenderness. Neurological: normal cranial nerves, romberg negative, normal sensation  Psychiatric: Patient has a normal mood and affect. behavior is normal. Judgment and thought content normal.   Recent Results (from the past 2160 hour(s))  COMPLETE METABOLIC PANEL WITH GFR     Status: Abnormal   Collection Time: 10/28/18 12:00 AM  Result Value Ref Range   Glucose, Bld 117 (H) 65 - 99 mg/dL    Comment: .            Fasting reference interval . For someone without known diabetes, a glucose value between 100 and 125 mg/dL is consistent with prediabetes and should be confirmed with a follow-up test. .    BUN 11 7 - 25 mg/dL   Creat 0.82 0.50 - 0.99 mg/dL    Comment: For patients >24 years of age, the reference limit for Creatinine is approximately 13% higher for people identified as African-American. .    GFR, Est Non African American 73 > OR = 60 mL/min/1.72m2   GFR, Est African American 85 > OR = 60 mL/min/1.36m2   BUN/Creatinine Ratio NOT APPLICABLE 6 - 22 (calc)   Sodium 139 135 - 146 mmol/L   Potassium 4.2 3.5 - 5.3 mmol/L   Chloride 103 98 - 110 mmol/L   CO2 29 20 - 32 mmol/L   Calcium 9.4 8.6 - 10.4 mg/dL   Total Protein 6.4 6.1 - 8.1 g/dL   Albumin  3.9 3.6 - 5.1 g/dL   Globulin 2.5 1.9 - 3.7 g/dL (calc)   AG Ratio 1.6 1.0 - 2.5 (calc)   Total Bilirubin 0.3 0.2 - 1.2 mg/dL   Alkaline phosphatase (APISO) 69 37 - 153 U/L   AST 11 10 - 35 U/L   ALT 9 6 - 29 U/L  Hemoglobin A1c     Status: Abnormal   Collection Time: 10/28/18 12:00 AM  Result Value Ref Range   Hgb A1c MFr Bld 6.2 (H) <5.7 % of total Hgb    Comment: For someone without known diabetes, a hemoglobin  A1c value between 5.7% and 6.4% is consistent with prediabetes and should be confirmed with a  follow-up test. . For someone with known diabetes, a value <7% indicates that their diabetes is well controlled. A1c targets should be individualized based on duration of diabetes, age, comorbid conditions, and  other considerations. . This assay result is consistent with an increased risk of diabetes. . Currently, no consensus exists regarding use of hemoglobin A1c for diagnosis of diabetes for children. .    Mean Plasma Glucose 131 (calc)   eAG (mmol/L) 7.3 (calc)  Lipid panel     Status: Abnormal   Collection Time: 10/28/18 12:00 AM  Result Value Ref Range   Cholesterol 200 (H) <200 mg/dL   HDL 48 (L) > OR = 50 mg/dL   Triglycerides 538 (H) <150 mg/dL    Comment: . If a non-fasting specimen was collected, consider repeat triglyceride testing on a fasting specimen if clinically indicated.  Yates Decamp et al. J. of Clin. Lipidol. [id number]. . . There is increased risk of pancreatitis when the  triglyceride concentration is very high  (> or = 500 mg/dL, especially if > or = 1000 mg/dL).  Yates Decamp et al. J. of Clin. Lipidol. [id number]. [name]    LDL Cholesterol (Calc)  mg/dL (calc)    Comment: . LDL cholesterol not calculated. Triglyceride levels greater than 400 mg/dL invalidate calculated LDL results. . Reference range: <100 . Desirable range <100 mg/dL for primary prevention;   <70 mg/dL for patients with CHD or diabetic patients  with > or = 2 CHD risk factors. [name] LDL-C is now calculated using the Martin-Hopkins  calculation, which is a validated novel method providing  better accuracy than the Friedewald equation in the  estimation of LDL-C.  Cresenciano Genre et al. Annamaria Helling. [id number]): 2061-2068  (http://education.[facility].com/faq/FAQ164)    Total CHOL/HDL Ratio 4.2 <5.0 (calc)   Non-HDL Cholesterol (Calc) 152 (H) <130 mg/dL (calc)    Comment: For patients with diabetes plus 1 major ASCVD risk  factor, treating to a non-HDL-C goal of <100 mg/dL  (LDL-C of <70 mg/dL) is considered a therapeutic  option.   Iron, TIBC and Ferritin Panel     Status: Abnormal   Collection Time: 10/28/18 12:00 AM  Result Value Ref Range   Iron 47 45 - 160 mcg/dL   TIBC 366 250 -  450 mcg/dL (calc)   %SAT 13 (L) 16 - 45 % (calc)   Ferritin 11 (L) 16 - 288 ng/mL  CBC with Differential/Platelet     Status: None   Collection Time: 10/28/18 12:00 AM  Result Value Ref Range   WBC 6.7 3.8 - 10.8 Thousand/uL   RBC 4.18 3.80 - 5.10 Million/uL   Hemoglobin 11.9 11.7 - 15.5 g/dL   HCT 35.3 35.0 - 45.0 %   MCV 84.4 80.0 - 100.0 fL   MCH 28.5 27.0 - 33.0 pg  MCHC 33.7 32.0 - 36.0 g/dL   RDW 15.0 11.0 - 15.0 %   Platelets 280 140 - 400 Thousand/uL   MPV 10.4 7.5 - 12.5 fL   Neutro Abs 3,846 1,500 - 7,800 cells/uL   Lymphs Abs 1,816 850 - 3,900 cells/uL   Absolute Monocytes 623 200 - 950 cells/uL   Eosinophils Absolute 355 15 - 500 cells/uL   Basophils Absolute 60 0 - 200 cells/uL   Neutrophils Relative % 57.4 %   Total Lymphocyte 27.1 %   Monocytes Relative 9.3 %   Eosinophils Relative 5.3 %   Basophils Relative 0.9 %  VITAMIN D 25 Hydroxy (Vit-D Deficiency, Fractures)     Status: Abnormal   Collection Time: 10/28/18 12:00 AM  Result Value Ref Range   Vit D, 25-Hydroxy 26 (L) 30 - 100 ng/mL    Comment: Vitamin D Status         25-OH Vitamin D: . Deficiency:                    <20 ng/mL Insufficiency:             20 - 29 ng/mL Optimal:                 > or = 30 ng/mL . For 25-OH Vitamin D testing on patients on  D2-supplementation and patients for whom quantitation  of D2 and D3 fractions is required, the [facility]ure[facility](TM) 25-OH VIT D, (D2,D3), LC/MS/MS is recommended: order  code [phone] (patients >66yrs). See Note 1 . Note 1 . For additional information, please refer to  http://education.[facility].com/faq/FAQ199  (This link is being provided for informational/ educational purposes only.)   Basic metabolic panel     Status: Abnormal   Collection Time: 12/11/18  2:19 PM  Result Value Ref Range   Sodium 136 135 - 145 mmol/L   Potassium 4.2 3.5 - 5.1 mmol/L   Chloride 101 98 - 111 mmol/L   CO2 25 22 - 32 mmol/L   Glucose, Bld 108 (H) 70 - 99 mg/dL    BUN 18 8 - 23 mg/dL   Creatinine, Ser 0.88 0.44 - 1.00 mg/dL   Calcium 9.1 8.9 - 10.3 mg/dL   GFR calc non Af Amer >60 >60 mL/min   GFR calc Af Amer >60 >60 mL/min   Anion gap 10 5 - 15    Comment: Performed at [facility], [address] [place], [place] [address]  CBC with Differential     Status: Abnormal   Collection Time: 12/11/18  2:19 PM  Result Value Ref Range   WBC 8.4 4.0 - 10.5 K/uL   RBC 4.51 3.87 - 5.11 MIL/uL   Hemoglobin 12.5 12.0 - 15.0 g/dL   HCT 39.1 36.0 - 46.0 %   MCV 86.7 80.0 - 100.0 fL   MCH 27.7 26.0 - 34.0 pg   MCHC 32.0 30.0 - 36.0 g/dL   RDW 15.7 (H) 11.5 - 15.5 %   Platelets 288 150 - 400 K/uL   nRBC 0.0 0.0 - 0.2 %   Neutrophils Relative % 59 %   Neutro Abs 5.0 1.7 - 7.7 K/uL   Lymphocytes Relative 28 %   Lymphs Abs 2.4 0.7 - 4.0 K/uL   Monocytes Relative 10 %   Monocytes Absolute 0.8 0.1 - 1.0 K/uL   Eosinophils Relative 1 %   Eosinophils Absolute 0.1 0.0 - 0.5 K/uL   Basophils Relative 1 %   Basophils Absolute 0.1 0.0 -  0.1 K/uL   Immature Granulocytes 1 %   Abs Immature Granulocytes 0.06 0.00 - 0.07 K/uL    Comment: Performed at [facility], [place], [place] [address]  Troponin I (High Sensitivity)     Status: None   Collection Time: 12/11/18  2:19 PM  Result Value Ref Range   Troponin I (High Sensitivity) 5 <18 ng/L    Comment: (NOTE) Elevated high sensitivity troponin I (hsTnI) values and significant  changes across serial measurements may suggest ACS but many other  chronic and acute conditions are known to elevate hsTnI results.  Refer to the "Links" section for chest pain algorithms and additional  guidance. Performed at [facility], [address] [place], [place] [address]   Urinalysis, Complete w Microscopic     Status: Abnormal   Collection Time: 12/11/18  2:27 PM  Result Value Ref Range   Color, Urine YELLOW (A) YELLOW   APPearance CLEAR (A) CLEAR   Specific Gravity,  Urine 1.015 1.005 - 1.030   pH 7.0 5.0 - 8.0   Glucose, UA NEGATIVE NEGATIVE mg/dL   Hgb urine dipstick NEGATIVE NEGATIVE   Bilirubin Urine NEGATIVE NEGATIVE   Ketones, ur NEGATIVE NEGATIVE mg/dL   Protein, ur NEGATIVE NEGATIVE mg/dL   Nitrite NEGATIVE NEGATIVE   Leukocytes,Ua NEGATIVE NEGATIVE   RBC / HPF 0-5 0 - 5 RBC/hpf   WBC, UA NONE SEEN 0 - 5 WBC/hpf   Bacteria, UA NONE SEEN NONE SEEN   Squamous Epithelial / LPF 0-5 0 - 5    Comment: Performed at [facility], [address] [place],  [address]    PHQ2/9: Depression screen [facility] 2/9 12/23/2018 10/28/2018 09/24/2018 07/26/2018 07/11/2018  Decreased Interest 2 0 1 1 1   Down, Depressed, Hopeless 2 0 1 1 1   PHQ - 2 Score 4 0 2 2 2   Altered sleeping 3 3 0 1 1  Tired, decreased energy 2 2 1 1 1   Change in appetite 1 1 0 0 1  Feeling bad or failure about yourself  0 0 0 0 1  Trouble concentrating 1 0 0 0 1  Moving slowly or fidgety/restless 1 0 0 0 1  Suicidal thoughts 0 0 0 0 1  PHQ-9 Score 12 6 3 4 9   Difficult doing work/chores Somewhat difficult Somewhat difficult Not difficult at all Not difficult at all Not difficult at all  Some recent data might be hidden    phq 9 is positive - she states it comes and goes, she is making herself get out of the house   Fall Risk: Fall Risk  12/23/2018 12/09/2018 11/25/2018 10/28/2018 09/24/2018  Falls in the past year? 1 0 0 0 0  Number falls in past yr: 0 - 0 0 0  Injury with Fall? 0 - 0 0 0  Risk Factor Category  - - - - -  Risk for fall due to : - - - - -  Risk for fall due to: Comment - - - - -  Follow up - - - - Falls prevention discussed    Functional Status Survey: Is the patient deaf or have difficulty hearing?: No Does the patient have difficulty seeing, even when wearing glasses/contacts?: Yes Does the patient have difficulty concentrating, remembering, or making decisions?: No Does the patient have difficulty walking or climbing stairs?: Yes Does the  patient have difficulty dressing or bathing?: No Does the patient have difficulty doing errands alone such as visiting  a doctor's office or shopping?: No    Assessment & Plan  1. Essential hypertension  We will decrease dose of Benicar HCTZ to half dose and return for CMA visit in one week for bp check, she is off metoprolol   - olmesartan-hydrochlorothiazide (BENICAR HCT) 40-12.5 MG tablet; Take 0.5 tablets by mouth daily.  Dispense: 30 tablet; Refill: 0  2. History of syncope  Discussed referral to neurologist but she is currently seeing ENT and has some imaging studies pending , she wants to hold off for now on seeing neurologist  - olmesartan-hydrochlorothiazide (BENICAR HCT) 40-12.5 MG tablet; Take 0.5 tablets by mouth daily.  Dispense: 30 tablet; Refill: 0

## 2018-12-26 ENCOUNTER — Ambulatory Visit (INDEPENDENT_AMBULATORY_CARE_PROVIDER_SITE_OTHER): Payer: Self-pay | Admitting: Physician Assistant

## 2018-12-26 ENCOUNTER — Encounter: Payer: Self-pay | Admitting: Physician Assistant

## 2018-12-26 ENCOUNTER — Other Ambulatory Visit: Payer: Self-pay

## 2018-12-26 VITALS — BP 142/81 | HR 92 | Temp 97.2°F | Resp 16 | Ht 64.0 in | Wt 203.6 lb

## 2018-12-26 DIAGNOSIS — Z09 Encounter for follow-up examination after completed treatment for conditions other than malignant neoplasm: Secondary | ICD-10-CM

## 2018-12-26 DIAGNOSIS — R2232 Localized swelling, mass and lump, left upper limb: Secondary | ICD-10-CM

## 2018-12-26 NOTE — Progress Notes (Signed)
[facility] POST-OP OFFICE VISIT  12/26/2018  HPI: Jacqueline Lucas is a 68 y.o. female 17 days s/p excision of left axillary cyst with Dr Dahlia Byes.   Today, she is doing well. No pain. No issues with incision. No fever, chills.   Vital signs: BP (!) 142/81   Pulse 92   Temp (!) 97.2 F (36.2 C) (Temporal)   Resp 16   Ht 5\' 4"  (1.626 m)   Wt 203 lb 9.6 oz (92.4 kg)   SpO2 95%   BMI 34.95 kg/m    Physical Exam: Constitutional: Well appearing female, NAD Skin: Incision to left axilla is CDI, no erythema, no drainage  Assessment/Plan: This is a 68 y.o. female 17 days s/p excision of left axillary cyst   - pain control prn  - Reviewed Pathology : EIC  - follow up prn  -- Edison Simon, PA-C [facility] 12/26/2018, 9:32 AM [phone] M-F: 7am - 4pm

## 2018-12-26 NOTE — Patient Instructions (Signed)
Please call if you have any questions or concerns.  °

## 2018-12-31 ENCOUNTER — Ambulatory Visit: Payer: Medicare Other

## 2018-12-31 ENCOUNTER — Other Ambulatory Visit: Payer: Self-pay | Admitting: Family Medicine

## 2018-12-31 ENCOUNTER — Other Ambulatory Visit: Payer: Self-pay

## 2018-12-31 VITALS — BP 120/52 | HR 81

## 2018-12-31 DIAGNOSIS — K219 Gastro-esophageal reflux disease without esophagitis: Secondary | ICD-10-CM | POA: Diagnosis not present

## 2018-12-31 DIAGNOSIS — R140 Abdominal distension (gaseous): Secondary | ICD-10-CM | POA: Diagnosis not present

## 2018-12-31 DIAGNOSIS — K5909 Other constipation: Secondary | ICD-10-CM | POA: Diagnosis not present

## 2018-12-31 DIAGNOSIS — R110 Nausea: Secondary | ICD-10-CM | POA: Diagnosis not present

## 2018-12-31 DIAGNOSIS — E785 Hyperlipidemia, unspecified: Secondary | ICD-10-CM

## 2018-12-31 DIAGNOSIS — I10 Essential (primary) hypertension: Secondary | ICD-10-CM

## 2018-12-31 NOTE — Progress Notes (Signed)
Patient is here for a blood pressure check. Patient denies chest pain, palpitations, shortness of breath or visual disturbances. At previous visit blood pressure was 102/58 with a heart rate of 93. Today during nurse visit first check blood pressure was 142/68. After resting for 10 minutes it was 120/52 and heart rate was 81. She does take any blood pressure medications.  On visit 12/23/2018: We will decrease dose of Benicar HCTZ to half dose and return for CMA visit in one week for bp check, she is off metoprolol.   Continue current regiment, I can send a refill of new Benicar hctz rx dose.

## 2019-01-01 ENCOUNTER — Ambulatory Visit
Admission: RE | Admit: 2019-01-01 | Discharge: 2019-01-01 | Disposition: A | Discharge status: Home / Self Care | Payer: Medicare Other | Source: Ambulatory Visit | Attending: Unknown Physician Specialty | Admitting: Unknown Physician Specialty

## 2019-01-01 ENCOUNTER — Other Ambulatory Visit: Payer: Self-pay

## 2019-01-01 DIAGNOSIS — H93A9 Pulsatile tinnitus, unspecified ear: Secondary | ICD-10-CM | POA: Insufficient documentation

## 2019-01-01 DIAGNOSIS — I6523 Occlusion and stenosis of bilateral carotid arteries: Secondary | ICD-10-CM | POA: Diagnosis not present

## 2019-01-17 ENCOUNTER — Other Ambulatory Visit: Payer: Self-pay | Admitting: Family Medicine

## 2019-01-17 DIAGNOSIS — B001 Herpesviral vesicular dermatitis: Secondary | ICD-10-CM

## 2019-01-17 NOTE — Telephone Encounter (Signed)
Medication Refill - Medication:  Valacyclovir HCL 1 mg Tablet   Has the patient contacted their pharmacy?  Yes advised to call office.   Preferred Pharmacy (with phone number or street name):  [id number] [name], [place] [phone] (Phone) [phone] (Fax)   Agent: Please be advised that RX refills may take up to 3 business days. We ask that you follow-up with your pharmacy.

## 2019-01-20 MED ORDER — VALACYCLOVIR HCL 1 G PO TABS: 1000.0000 mg | ORAL_TABLET | Freq: Two times a day (BID) | ORAL | 2 refills | Status: AC

## 2019-01-27 ENCOUNTER — Ambulatory Visit (INDEPENDENT_AMBULATORY_CARE_PROVIDER_SITE_OTHER): Payer: Medicare Other | Admitting: Family Medicine

## 2019-01-27 ENCOUNTER — Other Ambulatory Visit: Payer: Self-pay

## 2019-01-27 ENCOUNTER — Encounter: Payer: Self-pay | Admitting: Family Medicine

## 2019-01-27 DIAGNOSIS — Z87898 Personal history of other specified conditions: Secondary | ICD-10-CM | POA: Diagnosis not present

## 2019-01-27 DIAGNOSIS — I10 Essential (primary) hypertension: Secondary | ICD-10-CM | POA: Diagnosis not present

## 2019-01-27 DIAGNOSIS — J449 Chronic obstructive pulmonary disease, unspecified: Secondary | ICD-10-CM

## 2019-01-27 DIAGNOSIS — I209 Angina pectoris, unspecified: Secondary | ICD-10-CM

## 2019-01-27 DIAGNOSIS — M25551 Pain in right hip: Secondary | ICD-10-CM | POA: Diagnosis not present

## 2019-01-27 DIAGNOSIS — G43109 Migraine with aura, not intractable, without status migrainosus: Secondary | ICD-10-CM | POA: Diagnosis not present

## 2019-01-27 DIAGNOSIS — D509 Iron deficiency anemia, unspecified: Secondary | ICD-10-CM | POA: Diagnosis not present

## 2019-01-27 DIAGNOSIS — E785 Hyperlipidemia, unspecified: Secondary | ICD-10-CM | POA: Diagnosis not present

## 2019-01-27 DIAGNOSIS — R7303 Prediabetes: Secondary | ICD-10-CM | POA: Diagnosis not present

## 2019-01-27 MED ORDER — LEVALBUTEROL HCL 1.25 MG/3ML IN NEBU: 1.2500 mg | INHALATION_SOLUTION | RESPIRATORY_TRACT | 0 refills | Status: DC | PRN

## 2019-01-27 MED ORDER — ONDANSETRON HCL 4 MG PO TABS: 4.0000 mg | ORAL_TABLET | Freq: Three times a day (TID) | ORAL | 1 refills | Status: AC | PRN

## 2019-01-27 MED ORDER — OLMESARTAN MEDOXOMIL-HCTZ 40-12.5 MG PO TABS: 0.5000 | ORAL_TABLET | Freq: Every day | ORAL | 1 refills | Status: DC

## 2019-01-27 MED ORDER — ATORVASTATIN CALCIUM 40 MG PO TABS: 40.0000 mg | ORAL_TABLET | Freq: Every day | ORAL | 0 refills | Status: DC

## 2019-01-27 MED ORDER — OMEGA-3-ACID ETHYL ESTERS 1 G PO CAPS: 2.0000 g | ORAL_CAPSULE | Freq: Two times a day (BID) | ORAL | 1 refills | Status: DC

## 2019-01-27 NOTE — Progress Notes (Signed)
Name: Jacqueline Lucas   MRN: [id number]    DOB: 02-10-1951   Date:01/27/2019       Progress Note  Subjective  Chief Complaint  Chief Complaint  Patient presents with  . Medication Refill    3 month F/U-Need refill on Zofran  . Asthma  . Hypertension    Headaches occasionally   . Manic Behavior  . Migraine headaches  . Constipation  . Hip Pain    Onset-6 months, right hip     I connected with  Jacqueline Lucas  on 01/27/19 at  3:20 PM EST by a video enabled telemedicine application and verified that I am speaking with the correct person using two identifiers.  I discussed the limitations of evaluation and management by telemedicine and the availability of in person appointments. The patient expressed understanding and agreed to proceed. Staff also discussed with the patient that there may be a patient responsible charge related to this service. Patient Location: at home  Provider Location: [facility]   HPI  HTN/SVTshe is takingBenicar - 40-12.5, mteoprolol,and is doing well,no chest pain or palpitation. BP not checked at home today   Bipolar disorder: she is doing well on Depakote and Effexor, denies mania,but she statescontinues to have episodes of not sleeping for two or three days in a row, we will try adding Seroquel, that is chronic  She states she still taking hydroxyzine prn and seems to help with anxiety. Her children are very supportive , she is currently helping her 69 yo  - watching her during the day and helping with school work, also helps to assist her daughter ( paramedic ) and keeps her 3 children at night sometimes. Her daughter lost her Public librarian. She is happy but feeling overwhelmed, she has noticed more migraine episodes lately.   FMS: seeing Dr. Olegario Lucas on muscle relaxer and gabapentin.Unchanged   Dyslipidemia: currently on lipitor only, off Tricorand on Lovaza, no side effects. Last level showed worsening of triglycerides.  Discussed change in diet , she will increase dose to two BID, current only taking two daily   Goiter: she was seen by Dr. Ezequiel Lucas the past, but switched to Dr. Gabriel Lucas and was given reassurance, no need for biopsy at this time, she states Dr. Gabriel Lucas monitors her level . Unchanged   COPD/Asthma:from second hand smoking, taking Duleraand singulair.Shestates she has occasional wheezing and uses her nebulizer a couple of times a week and would like a refill    Angina/ History of SVT: doing well, on metoprolol, statin, and aspirin, sees Dr. Clayborn Lucas yearlybasis now.   Obesity & Hyperglycemia: shehas hyperglycemia./insuilin resistance,She had  some metformin at home but caused diarrhea and she stopped it.Last A1C done 09/2018 was down to 6.2%, she states she stopped drinking soft drinks, drinking water and un-sweet tea   Constipation: seen by GI back in 2017 with same symptoms, colonoscopy was normal, and advised to take Miralax, she is now on Linzess daily and is doing well, no blood in stools or straining lately   Pulsatile Tinnitus: seen by Dr. Tami Lucas , she had an US carotid done 01/01/2019 moderate calcified plaques , negative intracranial MRA, she states beeping sensation is stable, and she is going back for follow up with ENT. She states she feels better since she was reassured   Right hip pain: going on for the past 6 months , she states worse with activity such as walking, pain is described as aching on upper thigh and right  groin, she has an appointment with Dr. Marlou Lucas in [place], he was the Ortho that replaced the right hip in the past   Iron deficiency anemia: up to date with colonoscopy, no dyspepsia or indigestion, no blood in stools, we will check hemoccult cards and if positive refer back if negative we will send ferrous sulfate to pharmacy   Patient Active Problem List   Diagnosis Date Noted  . Superior glenoid labrum lesion of left shoulder   . Lumbar stenosis  02/04/2018  . Hx of hysterectomy 08/13/2017  . Pain in left wrist 06/12/2017  . Chronic venous insufficiency 06/09/2017  . Varicose veins of both lower extremities with pain 04/30/2017  . Claudication ([place]) 04/30/2017  . Arthritis of carpometacarpal [facility]) joint of left thumb 05/12/2016  . Bilateral thumb pain 05/01/2016  . Anemia 02/03/2016  . Status post bilateral total hip replacement 01/10/2016  . Sicca ([place]) 01/10/2016  . Age related osteoporosis 01/10/2016  . Hip arthritis 08/16/2015  . Microscopic hematuria 07/30/2015  . Vaginal atrophy 07/30/2015  . GAD (generalized anxiety disorder) 07/08/2015  . Moderate episode of recurrent major depressive disorder ([place]) 07/08/2015  . Migraine with aura and without status migrainosus, not intractable 07/08/2015  . Dyslipidemia 07/08/2015  . Degenerative arthritis of right knee 03/16/2015  . Atrophic vaginitis 01/31/2015  . Recurrent UTI 12/31/2014  . IBS (irritable bowel syndrome) 10/29/2014  . Asthma, moderate persistent 09/08/2014  . Allergic state 09/07/2014  . Colon polyp 09/07/2014  . Hemorrhoid 09/07/2014  . Constrictive tenosynovitis 03/12/2014  . H/O total knee replacement, bilateral 07/31/2013  . CAD (coronary artery disease) 11/28/2012  . Anxiety 11/28/2012  . Acid reflux 11/28/2012  . Cardiac murmur 11/28/2012  . BP (high blood pressure) 11/28/2012  . Cannot sleep 11/28/2012  . Adiposity 11/28/2012  . Restless leg 11/28/2012  . Supraventricular tachycardia ([place]) 11/28/2012  . Fibromyalgia 11/28/2012  . Tachycardia 11/28/2012    Past Surgical History:  Procedure Laterality Date  . ABDOMINAL HYSTERECTOMY     due to cancer of ovaries  . BACK SURGERY N/A 2014   x 5  . BACK SURGERY  12/2017  . CARDIAC CATHETERIZATION     no PCI; 12/18/12 ([facility], Dr. Marcello Moores): EP study with ablation. No coronary angiography done then.  . COLONOSCOPY W/ POLYPECTOMY    . COLONOSCOPY WITH PROPOFOL N/A 02/04/2016   Procedure: COLONOSCOPY  WITH PROPOFOL;  Surgeon: Robert Bellow, MD;  Location: [facility] ENDOSCOPY;  Service: Endoscopy;  Laterality: N/A;  . ESOPHAGOGASTRODUODENOSCOPY (EGD) WITH PROPOFOL N/A 02/04/2016   Procedure: ESOPHAGOGASTRODUODENOSCOPY (EGD) WITH PROPOFOL;  Surgeon: Robert Bellow, MD;  Location: [facility] ENDOSCOPY;  Service: Endoscopy;  Laterality: N/A;  . FINGER ARTHROSCOPY WITH CARPOMETACARPEL ([place]) ARTHROPLASTY Left 05/01/2016   Dr. Erlinda Hong  . GIVENS CAPSULE STUDY N/A 05/02/2016   Procedure: GIVENS CAPSULE STUDY;  Surgeon: Manya Silvas, MD;  Location: [facility] ENDOSCOPY;  Service: Endoscopy;  Laterality: N/A;  . HAND SURGERY    . HEMORRHOID SURGERY    . HIP ARTHROPLASTY    . JOINT REPLACEMENT Bilateral    knee  . KNEE ARTHROPLASTY    . REPLACEMENT TOTAL KNEE Left   . SHOULDER ARTHROSCOPY Left 09/09/2018   Procedure: LEFT SHOULDER ARTHROSCOPY, BICEPS TENOTOMY;  Surgeon: Meredith Pel, MD;  Location: [place];  Service: Orthopedics;  Laterality: Left;  . SPINE SURGERY     x 2  . tendonititis     right elbow, right wrist  . TOTAL HIP ARTHROPLASTY Left 2013  .  TOTAL HIP ARTHROPLASTY Right 08/16/2015   Procedure: TOTAL HIP ARTHROPLASTY ANTERIOR APPROACH;  Surgeon: Meredith Pel, MD;  Location: [place];  Service: Orthopedics;  Laterality: Right;  . TOTAL KNEE ARTHROPLASTY Right 03/16/2015   Procedure: RIGHT TOTAL KNEE ARTHROPLASTY, PCL SACRIFICING;  Surgeon: Meredith Pel, MD;  Location: [place];  Service: Orthopedics;  Laterality: Right;    Family History  Problem Relation Age of Onset  . Diabetes Mother   . Hypertension Mother   . Heart disease Mother   . Alzheimer's disease Mother   . Diabetes Father   . Throat cancer Father   . Hypertension Sister   . Depression Sister   . Fibromyalgia Sister   . Cancer Brother   . Heart disease Brother   . Stroke Son   . Fibromyalgia Daughter   . Bladder Cancer Neg Hx   . Kidney disease Neg Hx   . Kidney cancer Neg Hx     Social  History   Socioeconomic History  . Marital status: Widowed    Spouse name: Fritz Pickerel  . Number of children: 4  . Years of education: some college  . Highest education level: 12th grade  Occupational History  . Occupation: Retired  Scientific laboratory technician  . Financial resource strain: Not hard at all  . Food insecurity    Worry: Never true    Inability: Never true  . Transportation needs    Medical: No    Non-medical: No  Tobacco Use  . Smoking status: Never Smoker  . Smokeless tobacco: Never Used  . Tobacco comment: smoking cessation materials not required  Substance and Sexual Activity  . Alcohol use: No    Alcohol/week: 0.0 standard drinks  . Drug use: No  . Sexual activity: Never  Lifestyle  . Physical activity    Days per week: 3 days    Minutes per session: 30 min  . Stress: To some extent  Relationships  . Social connections    Talks on phone: More than three times a week    Gets together: Twice a week    Attends religious service: 1 to 4 times per year    Active member of club or organization: No    Attends meetings of clubs or organizations: Never    Relationship status: Married  . Intimate partner violence    Fear of current or ex partner: No    Emotionally abused: No    Physically abused: No    Forced sexual activity: No  Other Topics Concern  . Not on file  Social History Narrative   Patient has 10 grandchildren, 4 great-granddaughters & 2 great-grandsons.   Widow since 02/2018 - married for 42 years     Current Outpatient Medications:  .  ALPRAZolam (XANAX) 0.25 MG tablet, Take by mouth., Disp: , Rfl:  .  aspirin 81 MG chewable tablet, Chew 81 mg by mouth daily., Disp: , Rfl:  .  atorvastatin (LIPITOR) 40 MG tablet, Take 1 tablet (40 mg total) by mouth daily., Disp: 90 tablet, Rfl: 0 .  baclofen (LIORESAL) 10 MG tablet, TAKE ONE TABLET AT 7AM AND ONE TABLET AT 2PM AS NEEDED FOR MUSCLE SPASM, Disp: 180 tablet, Rfl: 1 .  Cholecalciferol (D3-1000) 1000 units  tablet, Take 1,000 Units by mouth at bedtime. , Disp: , Rfl:  .  diclofenac sodium (VOLTAREN) 1 % GEL, Apply 2 g topically 4 (four) times daily., Disp: 150 g, Rfl: 2 .  divalproex (DEPAKOTE) 250 MG DR tablet, TAKE  1 TABLET TWICE A DAY, Disp: 180 tablet, Rfl: 1 .  DULERA 200-5 MCG/ACT AERO, USE 2 INHALATIONS ORALLY   TWICE DAILY, Disp: 39 g, Rfl: 0 .  estradiol (ESTRACE) 0.1 MG/GM vaginal cream, FOR DIRECTIONS ON HOW TO   TAKE THIS MEDICINE, READ   THE ENCLOSED MEDICATION    INFORMATION FORM, Disp: 42.5 g, Rfl: 3 .  gabapentin (NEURONTIN) 300 MG capsule, TAKE 1 CAPSULE EVERY MORNING, TAKE 1 CAPSULE AT NOON AND TAKE 2 CAPSULES   (600MG ) IN THE EVENING (Patient taking differently: Take 300-600 mg by mouth 3 (three) times daily. TAKE 1 CAPSULE EVERY MORNING, TAKE 1 CAPSULE AT NOON AND TAKE 2 CAPSULES   (600MG ) IN THE EVENING), Disp: 360 capsule, Rfl: 0 .  hydrOXYzine (VISTARIL) 25 MG capsule, Take 1 capsule (25 mg total) by mouth 3 (three) times daily as needed for anxiety., Disp: 90 capsule, Rfl: 0 .  levalbuterol (XOPENEX) 1.25 MG/3ML nebulizer solution, Take 1.25 mg by nebulization every 4 (four) hours as needed for wheezing., Disp: 72 mL, Rfl: 0 .  levothyroxine (SYNTHROID, LEVOTHROID) 50 MCG tablet, Take 50 mcg by mouth daily before breakfast., Disp: , Rfl:  .  LINZESS 145 MCG CAPS capsule, TAKE 1 CAPSULE DAILY BEFOREBREAKFAST, Disp: 90 capsule, Rfl: 1 .  MAGNESIUM OXIDE PO, Take 400 mg by mouth at bedtime. Reported on 07/28/2015, Disp: , Rfl:  .  montelukast (SINGULAIR) 10 MG tablet, TAKE 1 TABLET AT BEDTIME, Disp: 90 tablet, Rfl: 1 .  nystatin (MYCOSTATIN) 100000 UNIT/ML suspension, TAKE 5 MLS (500,000 UNITS TOTAL) BY MOUTH 4 (FOUR) TIMES DAILY., Disp: 480 mL, Rfl: 0 .  olmesartan-hydrochlorothiazide (BENICAR HCT) 40-12.5 MG tablet, Take 0.5 tablets by mouth daily., Disp: 90 tablet, Rfl: 1 .  omega-3 acid ethyl esters (LOVAZA) 1 g capsule, Take 2 capsules (2 g total) by mouth 2 (two) times daily.,  Disp: 360 capsule, Rfl: 1 .  ondansetron (ZOFRAN) 4 MG tablet, Take 1 tablet (4 mg total) by mouth every 8 (eight) hours as needed for nausea or vomiting., Disp: 20 tablet, Rfl: 1 .  oxyCODONE (OXY IR/ROXICODONE) 5 MG immediate release tablet, Take 5 mg by mouth every 8 (eight) hours as needed., Disp: , Rfl:  .  polyethylene glycol (MIRALAX / GLYCOLAX) packet, Take 17 g by mouth daily as needed., Disp: , Rfl:  .  RESTASIS 0.05 % ophthalmic emulsion, Place 1 drop into both eyes 2 (two) times daily. , Disp: , Rfl:  .  valACYclovir (VALTREX) 1000 MG tablet, Take 1 tablet (1,000 mg total) by mouth 2 (two) times daily., Disp: 20 tablet, Rfl: 2 .  venlafaxine XR (EFFEXOR XR) 75 MG 24 hr capsule, Take 1 capsule (75 mg total) by mouth daily with breakfast., Disp: 90 capsule, Rfl: 1 .  vitamin B-12 (CYANOCOBALAMIN) 1000 MCG tablet, Take 1,000 mcg by mouth at bedtime., Disp: , Rfl:  .  lansoprazole (PREVACID) 30 MG capsule, Take 1 capsule (30 mg total) daily by mouth. (Patient taking differently: Take 30 mg by mouth 2 (two) times daily before a meal. ), Disp: 90 capsule, Rfl: 1  Allergies  Allergen Reactions  . Morphine Nausea And Vomiting    Ok with nausea medication  . Lyrica [Pregabalin] Other (See Comments)    Joint swelling  . Metformin And Related     diarrhea  . Diazepam Nausea And Vomiting    Can take with anti-nausea medication  . Latex Rash  . Rash Away  [Petrolatum-Zinc Oxide] Rash and Swelling  . Salicylates  Other (See Comments)    Other reaction(s): Headache  . Tape Rash    Please use paper tape    I personally reviewed active problem list, medication list, allergies, family history, social history, health maintenance with the patient/caregiver today.   ROS  Ten systems reviewed and is negative except as mentioned in HPI   Objective  Virtual encounter, vitals not obtained.  There is no height or weight on file to calculate BMI.  Physical Exam  Awake, alert and oriented   PHQ2/9: Depression screen [facility] 2/9 01/27/2019 12/23/2018 10/28/2018 09/24/2018 07/26/2018  Decreased Interest 0 2 0 1 1  Down, Depressed, Hopeless 0 2 0 1 1  PHQ - 2 Score 0 4 0 2 2  Altered sleeping 2 3 3  0 1  Tired, decreased energy 1 2 2 1 1   Change in appetite 0 1 1 0 0  Feeling bad or failure about yourself  0 0 0 0 0  Trouble concentrating 0 1 0 0 0  Moving slowly or fidgety/restless 0 1 0 0 0  Suicidal thoughts 0 0 0 0 0  PHQ-9 Score 3 12 6 3 4   Difficult doing work/chores Not difficult at all Somewhat difficult Somewhat difficult Not difficult at all Not difficult at all  Some recent data might be hidden   PHQ-2/9 Result is negative.    Fall Risk: Fall Risk  01/27/2019 12/26/2018 12/23/2018 12/09/2018 11/25/2018  Falls in the past year? 1 0 1 0 0  Number falls in past yr: 0 - 0 - 0  Injury with Fall? 0 - 0 - 0  Risk Factor Category  - - - - -  Risk for fall due to : - - - - -  Risk for fall due to: Comment - - - - -  Follow up - - - - -     Assessment & Plan  1. COPD with asthma ([place])  - levalbuterol (XOPENEX) 1.25 MG/3ML nebulizer solution; Take 1.25 mg by nebulization every 4 (four) hours as needed for wheezing.  Dispense: 72 mL; Refill: 0  2. Migraine with aura and without status migrainosus, not intractable   ondansetron (ZOFRAN) 4 MG tablet; Take 1 tablet (4 mg total) by mouth every 8 (eight) hours as needed for nausea or vomiting.  Dispense: 20 tablet; Refill: 1  3. Essential hypertension  - olmesartan-hydrochlorothiazide (BENICAR HCT) 40-12.5 MG tablet; Take 0.5 tablets by mouth daily.  Dispense: 90 tablet; Refill: 1  4. History of syncope  - olmesartan-hydrochlorothiazide (BENICAR HCT) 40-12.5 MG tablet; Take 0.5 tablets by mouth daily.  Dispense: 90 tablet; Refill: 1  5. Dyslipidemia  - atorvastatin (LIPITOR) 40 MG tablet; Take 1 tablet (40 mg total) by mouth daily.  Dispense: 90 tablet; Refill: 0 - omega-3 acid ethyl esters (LOVAZA) 1 g capsule; Take 2  capsules (2 g total) by mouth 2 (two) times daily.  Dispense: 360 capsule; Refill: 1  6. Iron deficiency anemia, unspecified iron deficiency anemia type  - POC Hemoccult Bld/Stl (3-Cd Home Screen); Future  7. Pre-diabetes   8. Right hip pain  Keep follow up with ortho   I discussed the assessment and treatment plan with the patient. The patient was provided an opportunity to ask questions and all were answered. The patient agreed with the plan and demonstrated an understanding of the instructions.  The patient was advised to call back or seek an in-person evaluation if the symptoms worsen or if the condition fails to improve as anticipated.  I provided 25  minutes of non-face-to-face time during this encounter.

## 2019-02-03 ENCOUNTER — Ambulatory Visit: Payer: Self-pay

## 2019-02-03 ENCOUNTER — Ambulatory Visit (INDEPENDENT_AMBULATORY_CARE_PROVIDER_SITE_OTHER): Payer: Medicare Other | Admitting: Orthopedic Surgery

## 2019-02-03 ENCOUNTER — Other Ambulatory Visit: Payer: Self-pay

## 2019-02-03 ENCOUNTER — Encounter: Payer: Self-pay | Admitting: Orthopedic Surgery

## 2019-02-03 DIAGNOSIS — Z96641 Presence of right artificial hip joint: Secondary | ICD-10-CM

## 2019-02-03 DIAGNOSIS — I209 Angina pectoris, unspecified: Secondary | ICD-10-CM | POA: Diagnosis not present

## 2019-02-03 DIAGNOSIS — M25512 Pain in left shoulder: Secondary | ICD-10-CM | POA: Diagnosis not present

## 2019-02-03 DIAGNOSIS — R29898 Other symptoms and signs involving the musculoskeletal system: Secondary | ICD-10-CM | POA: Diagnosis not present

## 2019-02-03 DIAGNOSIS — M25551 Pain in right hip: Secondary | ICD-10-CM

## 2019-02-03 NOTE — Progress Notes (Deleted)
10:04 PM   Jacqueline Lucas 1950-05-29 [id number]  Referring provider: [name], MD [address] [place],  [place] [address]  No chief complaint on file.   HPI: Patient is a 68 year old female with recurrent UTI's, vaginal atrophy and a history of hematuria who presents today for a follow up.  History of recurrent UTI's Risk factors: age, vaginal atrophy and dementia.  No documented infections in 2018.  She is not having dysuria or suprapubic pain.  She has not had fevers, chills, nausea or vomiting.    Vaginal atrophy She does have vaginal atrophy and is using vaginal estrogen cream 3 nights weekly.  History of hematuria She does not experience dysuria or suprapubic pain.  She underwent a hematuria work up in 09/2013 with a CT Urogram and cystoscopy and no GU pathology was discovered.  Her UA is negative today.  She has not had any gross hematuria.     Today, is experiencing urgency x 0-3 (stable), frequency x 4-7 (stable), not restricting fluids to avoid visits to the restroom, not engaging in toilet mapping, incontinence x 0-3 (stable) and nocturia x 0-3 (stable).   Her main complaint today is nocturia.  Her PVR is 0 mL.    PMH: Past Medical History:  Diagnosis Date   Allergy    Allergy to adhesive tape    Allergy to paper tape as well   Alzheimer disease ([place])    Anemia    Anginal pain (HCC)    Anxiety    Arthritis    Asthma    Bronchitis    Cataract    Chronic nausea    Claustrophobia    DDD (degenerative disc disease), lumbar    Depression    Dry eyes    uses Restasis   Fibromyalgia    GERD (gastroesophageal reflux disease)    H/O bladder infections    Heart murmur    Hematuria    Hyperlipidemia    Hypertension    Hypothyroidism    IBS (irritable bowel syndrome)    Insomnia    Lumbar neuritis    Migraines    Obesity    Osteoporosis    Palpitation    RLS (restless legs syndrome)    Shoulder  fracture, right    Sicca (HCC)    Tachycardia    Thyroid disease     Surgical History: Past Surgical History:  Procedure Laterality Date   ABDOMINAL HYSTERECTOMY     due to cancer of ovaries   BACK SURGERY N/A 2014   x 5   BACK SURGERY  12/2017   CARDIAC CATHETERIZATION     no PCI; 12/18/12 ([facility], Dr. Marcello Moores): EP study with ablation. No coronary angiography done then.   COLONOSCOPY W/ POLYPECTOMY     COLONOSCOPY WITH PROPOFOL N/A 02/04/2016   Procedure: COLONOSCOPY WITH PROPOFOL;  Surgeon: Robert Bellow, MD;  Location: [facility] ENDOSCOPY;  Service: Endoscopy;  Laterality: N/A;   ESOPHAGOGASTRODUODENOSCOPY (EGD) WITH PROPOFOL N/A 02/04/2016   Procedure: ESOPHAGOGASTRODUODENOSCOPY (EGD) WITH PROPOFOL;  Surgeon: Robert Bellow, MD;  Location: [facility] ENDOSCOPY;  Service: Endoscopy;  Laterality: N/A;   FINGER ARTHROSCOPY WITH CARPOMETACARPEL ([place]) ARTHROPLASTY Left 05/01/2016   Dr. Erlinda Hong   GIVENS CAPSULE STUDY N/A 05/02/2016   Procedure: GIVENS CAPSULE STUDY;  Surgeon: Manya Silvas, MD;  Location: [facility] ENDOSCOPY;  Service: Endoscopy;  Laterality: N/A;   HAND SURGERY     HEMORRHOID SURGERY     HIP ARTHROPLASTY  JOINT REPLACEMENT Bilateral    knee   KNEE ARTHROPLASTY     REPLACEMENT TOTAL KNEE Left    SHOULDER ARTHROSCOPY Left 09/09/2018   Procedure: LEFT SHOULDER ARTHROSCOPY, BICEPS TENOTOMY;  Surgeon: Meredith Pel, MD;  Location: [place];  Service: Orthopedics;  Laterality: Left;   SPINE SURGERY     x 2   tendonititis     right elbow, right wrist   TOTAL HIP ARTHROPLASTY Left 2013   TOTAL HIP ARTHROPLASTY Right 08/16/2015   Procedure: TOTAL HIP ARTHROPLASTY ANTERIOR APPROACH;  Surgeon: Meredith Pel, MD;  Location: [place];  Service: Orthopedics;  Laterality: Right;   TOTAL KNEE ARTHROPLASTY Right 03/16/2015   Procedure: RIGHT TOTAL KNEE ARTHROPLASTY, PCL SACRIFICING;  Surgeon: Meredith Pel, MD;  Location: [place];  Service:  Orthopedics;  Laterality: Right;    Home Medications:  Allergies as of 02/04/2019      Reactions   Morphine Nausea And Vomiting   Ok with nausea medication   Lyrica [pregabalin] Other (See Comments)   Joint swelling   Metformin And Related    diarrhea   Diazepam Nausea And Vomiting   Can take with anti-nausea medication   Latex Rash   Rash Away  [petrolatum-zinc Oxide] Rash, Swelling   Salicylates Other (See Comments)   Other reaction(s): Headache   Tape Rash   Please use paper tape      Medication List       Accurate as of February 03, 2019 10:04 PM. If you have any questions, ask your nurse or doctor.        ALPRAZolam 0.25 MG tablet Commonly known as: XANAX Take by mouth.   aspirin 81 MG chewable tablet Chew 81 mg by mouth daily.   atorvastatin 40 MG tablet Commonly known as: LIPITOR Take 1 tablet (40 mg total) by mouth daily.   baclofen 10 MG tablet Commonly known as: LIORESAL TAKE ONE TABLET AT 7AM AND ONE TABLET AT 2PM AS NEEDED FOR MUSCLE SPASM   D3-1000 25 MCG (1000 UT) tablet Generic drug: Cholecalciferol Take 1,000 Units by mouth at bedtime.   diclofenac sodium 1 % Gel Commonly known as: Voltaren Apply 2 g topically 4 (four) times daily.   divalproex 250 MG DR tablet Commonly known as: DEPAKOTE TAKE 1 TABLET TWICE A DAY   Dulera 200-5 MCG/ACT Aero Generic drug: mometasone-formoterol USE 2 INHALATIONS ORALLY   TWICE DAILY   estradiol 0.1 MG/GM vaginal cream Commonly known as: ESTRACE FOR DIRECTIONS ON HOW TO   TAKE THIS MEDICINE, READ   THE ENCLOSED MEDICATION    INFORMATION FORM   gabapentin 300 MG capsule Commonly known as: NEURONTIN TAKE 1 CAPSULE EVERY MORNING, TAKE 1 CAPSULE AT NOON AND TAKE 2 CAPSULES   (600MG ) IN THE EVENING What changed:   how much to take  how to take this  when to take this   hydrOXYzine 25 MG capsule Commonly known as: VISTARIL Take 1 capsule (25 mg total) by mouth 3 (three) times daily as needed for  anxiety.   lansoprazole 30 MG capsule Commonly known as: Prevacid Take 1 capsule (30 mg total) daily by mouth. What changed: when to take this   levalbuterol 1.25 MG/3ML nebulizer solution Commonly known as: Xopenex Take 1.25 mg by nebulization every 4 (four) hours as needed for wheezing.   levothyroxine 50 MCG tablet Commonly known as: SYNTHROID Take 50 mcg by mouth daily before breakfast.   Linzess 145 MCG Caps capsule Generic drug: linaclotide  TAKE 1 CAPSULE DAILY BEFOREBREAKFAST   MAGNESIUM OXIDE PO Take 400 mg by mouth at bedtime. Reported on 07/28/2015   montelukast 10 MG tablet Commonly known as: SINGULAIR TAKE 1 TABLET AT BEDTIME   nystatin 100000 UNIT/ML suspension Commonly known as: MYCOSTATIN TAKE 5 MLS (500,000 UNITS TOTAL) BY MOUTH 4 (FOUR) TIMES DAILY.   olmesartan-hydrochlorothiazide 40-12.5 MG tablet Commonly known as: BENICAR HCT Take 0.5 tablets by mouth daily.   omega-3 acid ethyl esters 1 g capsule Commonly known as: LOVAZA Take 2 capsules (2 g total) by mouth 2 (two) times daily.   ondansetron 4 MG tablet Commonly known as: Zofran Take 1 tablet (4 mg total) by mouth every 8 (eight) hours as needed for nausea or vomiting.   oxyCODONE 5 MG immediate release tablet Commonly known as: Oxy IR/ROXICODONE Take 5 mg by mouth every 8 (eight) hours as needed.   polyethylene glycol 17 g packet Commonly known as: MIRALAX / GLYCOLAX Take 17 g by mouth daily as needed.   Restasis 0.05 % ophthalmic emulsion Generic drug: cycloSPORINE Place 1 drop into both eyes 2 (two) times daily.   valACYclovir 1000 MG tablet Commonly known as: VALTREX Take 1 tablet (1,000 mg total) by mouth 2 (two) times daily.   venlafaxine XR 75 MG 24 hr capsule Commonly known as: Effexor XR Take 1 capsule (75 mg total) by mouth daily with breakfast.   vitamin B-12 1000 MCG tablet Commonly known as: CYANOCOBALAMIN Take 1,000 mcg by mouth at bedtime.       Allergies:    Allergies  Allergen Reactions   Morphine Nausea And Vomiting    Ok with nausea medication   Lyrica [Pregabalin] Other (See Comments)    Joint swelling   Metformin And Related     diarrhea   Diazepam Nausea And Vomiting    Can take with anti-nausea medication   Latex Rash   Rash Away  [Petrolatum-Zinc Oxide] Rash and Swelling   Salicylates Other (See Comments)    Other reaction(s): Headache   Tape Rash    Please use paper tape    Family History: Family History  Problem Relation Age of Onset   Diabetes Mother    Hypertension Mother    Heart disease Mother    Alzheimer's disease Mother    Diabetes Father    Throat cancer Father    Hypertension Sister    Depression Sister    Fibromyalgia Sister    Cancer Brother    Heart disease Brother    Stroke Son    Fibromyalgia Daughter    Bladder Cancer Neg Hx    Kidney disease Neg Hx    Kidney cancer Neg Hx     Social History:  reports that she has never smoked. She has never used smokeless tobacco. She reports that she does not drink alcohol or use drugs.  ROS:                                        Physical Exam: There were no vitals taken for this visit.  Constitutional:  Well nourished. Alert and oriented, No acute distress. HEENT: [place] AT, moist mucus membranes.  Trachea midline, no masses. Cardiovascular: No clubbing, cyanosis, or edema. Respiratory: Normal respiratory effort, no increased work of breathing. GI: Abdomen is soft, non tender, non distended, no abdominal masses. Liver and spleen not palpable.  No hernias appreciated.  Stool sample  for occult testing is not indicated.   GU: No CVA tenderness.  No bladder fullness or masses.  *** external genitalia, *** pubic hair distribution, no lesions.  Normal urethral meatus, no lesions, no prolapse, no discharge.   No urethral masses, tenderness and/or tenderness. No bladder fullness, tenderness or masses. *** vagina mucosa,  *** estrogen effect, no discharge, no lesions, *** pelvic support, *** cystocele and *** rectocele noted.  No cervical motion tenderness.  Uterus is freely mobile and non-fixed.  No adnexal/parametria masses or tenderness noted.  Anus and perineum are without rashes or lesions.   ***  Skin: No rashes, bruises or suspicious lesions. Lymph: No cervical or inguinal adenopathy. Neurologic: Grossly intact, no focal deficits, moving all 4 extremities. Psychiatric: Normal mood and affect.   Laboratory Data: Lab Results  Component Value Date   WBC 8.4 12/11/2018   HGB 12.5 12/11/2018   HCT 39.1 12/11/2018   MCV 86.7 12/11/2018   PLT 288 12/11/2018    Lab Results  Component Value Date   CREATININE 0.88 12/11/2018    Lab Results  Component Value Date   HGBA1C 6.2 (H) 10/28/2018    Lab Results  Component Value Date   TSH 0.42 12/10/2015     Lab Results  Component Value Date   AST 11 10/28/2018   Lab Results  Component Value Date   ALT 9 10/28/2018    Urinalysis *** I have reviewed the labs  Pertinent Imaging: ***  Assessment & Plan:    1. History of hematuria  - She did complete a hematuria workup in 2015 and no GU pathology was discovered  -  Urinalysis, Complete - negative  - RTC in one year for UA  2. Vaginal atrophy:   Patient will continue the vaginal estrogen cream 3 nights weekly.  RTC in one year for an exam.    3. History of rUTI's Patient currently asymptomatic UA is bland Will return if she should experience UTI symptoms   No follow-ups on file.  These notes generated with voice recognition software. I apologize for typographical errors.  Zara Council, PA-C  [facility] [address] [place], [place] [address] [phone]

## 2019-02-04 ENCOUNTER — Other Ambulatory Visit: Payer: Self-pay | Admitting: Family Medicine

## 2019-02-04 ENCOUNTER — Other Ambulatory Visit: Payer: Self-pay | Admitting: Rheumatology

## 2019-02-04 ENCOUNTER — Ambulatory Visit: Payer: Medicare Other | Admitting: Urology

## 2019-02-04 DIAGNOSIS — F410 Panic disorder [episodic paroxysmal anxiety] without agoraphobia: Secondary | ICD-10-CM

## 2019-02-04 DIAGNOSIS — M797 Fibromyalgia: Secondary | ICD-10-CM

## 2019-02-04 NOTE — Telephone Encounter (Signed)
Last Visit: 11/28/2018 Next Visit: 05/29/2019  Okay to refill per Dr. Estanislado Pandy.

## 2019-02-06 NOTE — Progress Notes (Deleted)
9:36 PM   DEBI TACK Dec 14, 1950 [id number]  Referring provider: [name], MD [address] [place],  [place] [address]  No chief complaint on file.   HPI: Patient is a 68 year old female with recurrent UTI's, vaginal atrophy and a history of hematuria who presents today for a follow up.  History of recurrent UTI's Risk factors: age, vaginal atrophy and dementia.  No documented infections in 2018.  She is not having dysuria or suprapubic pain.  She has not had fevers, chills, nausea or vomiting.    Vaginal atrophy She does have vaginal atrophy and is using vaginal estrogen cream 3 nights weekly.  History of hematuria She does not experience dysuria or suprapubic pain.  She underwent a hematuria work up in 09/2013 with a CT Urogram and cystoscopy and no GU pathology was discovered.  Her UA is negative today.  She has not had any gross hematuria.     Today, is experiencing urgency x 0-3 (stable), frequency x 4-7 (stable), not restricting fluids to avoid visits to the restroom, not engaging in toilet mapping, incontinence x 0-3 (stable) and nocturia x 0-3 (stable).   Her main complaint today is nocturia.  Her PVR is 0 mL.    PMH: Past Medical History:  Diagnosis Date  . Allergy   . Allergy to adhesive tape    Allergy to paper tape as well  . Alzheimer disease ([place])   . Anemia   . Anginal pain ([place])   . Anxiety   . Arthritis   . Asthma   . Bronchitis   . Cataract   . Chronic nausea   . Claustrophobia   . DDD (degenerative disc disease), lumbar   . Depression   . Dry eyes    uses Restasis  . Fibromyalgia   . GERD (gastroesophageal reflux disease)   . H/O bladder infections   . Heart murmur   . Hematuria   . Hyperlipidemia   . Hypertension   . Hypothyroidism   . IBS (irritable bowel syndrome)   . Insomnia   . Lumbar neuritis   . Migraines   . Obesity   . Osteoporosis   . Palpitation   . RLS (restless legs syndrome)   . Shoulder  fracture, right   . Sicca ([place])   . Tachycardia   . Thyroid disease     Surgical History: Past Surgical History:  Procedure Laterality Date  . ABDOMINAL HYSTERECTOMY     due to cancer of ovaries  . BACK SURGERY N/A 2014   x 5  . BACK SURGERY  12/2017  . CARDIAC CATHETERIZATION     no PCI; 12/18/12 ([facility], Dr. Marcello Moores): EP study with ablation. No coronary angiography done then.  . COLONOSCOPY W/ POLYPECTOMY    . COLONOSCOPY WITH PROPOFOL N/A 02/04/2016   Procedure: COLONOSCOPY WITH PROPOFOL;  Surgeon: Robert Bellow, MD;  [facility] ENDOSCOPY;  Service: Endoscopy;  Laterality: N/A;  . ESOPHAGOGASTRODUODENOSCOPY (EGD) WITH PROPOFOL N/A 02/04/2016   Procedure: ESOPHAGOGASTRODUODENOSCOPY (EGD) WITH PROPOFOL;  Surgeon: Robert Bellow, MD;  Location: [facility] ENDOSCOPY;  Service: Endoscopy;  Laterality: N/A;  . FINGER ARTHROSCOPY WITH CARPOMETACARPEL ([place]) ARTHROPLASTY Left 05/01/2016   Dr. Erlinda Hong  . GIVENS CAPSULE STUDY N/A 05/02/2016   Procedure: GIVENS CAPSULE STUDY;  Surgeon: Manya Silvas, MD;  Location: [facility] ENDOSCOPY;  Service: Endoscopy;  Laterality: N/A;  . HAND SURGERY    . HEMORRHOID SURGERY    . HIP ARTHROPLASTY    .  JOINT REPLACEMENT Bilateral    knee  . KNEE ARTHROPLASTY    . REPLACEMENT TOTAL KNEE Left   . SHOULDER ARTHROSCOPY Left 09/09/2018   Procedure: LEFT SHOULDER ARTHROSCOPY, BICEPS TENOTOMY;  Surgeon: Meredith Pel, MD;  Location: [place];  Service: Orthopedics;  Laterality: Left;  . SPINE SURGERY     x 2  . tendonititis     right elbow, right wrist  . TOTAL HIP ARTHROPLASTY Left 2013  . TOTAL HIP ARTHROPLASTY Right 08/16/2015   Procedure: TOTAL HIP ARTHROPLASTY ANTERIOR APPROACH;  Surgeon: Meredith Pel, MD;  Location: [place];  Service: Orthopedics;  Laterality: Right;  . TOTAL KNEE ARTHROPLASTY Right 03/16/2015   Procedure: RIGHT TOTAL KNEE ARTHROPLASTY, PCL SACRIFICING;  Surgeon: Meredith Pel, MD;  Location: [place];  Service:  Orthopedics;  Laterality: Right;    Home Medications:  Allergies as of 02/07/2019      Reactions   Morphine Nausea And Vomiting   Ok with nausea medication   Lyrica [pregabalin] Other (See Comments)   Joint swelling   Metformin And Related    diarrhea   Diazepam Nausea And Vomiting   Can take with anti-nausea medication   Latex Rash   Rash Away  [petrolatum-zinc Oxide] Rash, Swelling   Salicylates Other (See Comments)   Other reaction(s): Headache   Tape Rash   Please use paper tape      Medication List       Accurate as of February 06, 2019  9:36 PM. If you have any questions, ask your nurse or doctor.        ALPRAZolam 0.25 MG tablet Commonly known as: XANAX Take by mouth.   aspirin 81 MG chewable tablet Chew 81 mg by mouth daily.   atorvastatin 40 MG tablet Commonly known as: LIPITOR Take 1 tablet (40 mg total) by mouth daily.   baclofen 10 MG tablet Commonly known as: LIORESAL TAKE ONE TABLET AT 7AM AND ONE TABLET AT 2PM AS NEEDED FOR MUSCLE SPASM   D3-1000 25 MCG (1000 UT) tablet Generic drug: Cholecalciferol Take 1,000 Units by mouth at bedtime.   diclofenac sodium 1 % Gel Commonly known as: Voltaren Apply 2 g topically 4 (four) times daily.   divalproex 250 MG DR tablet Commonly known as: DEPAKOTE TAKE 1 TABLET TWICE A DAY   Dulera 200-5 MCG/ACT Aero Generic drug: mometasone-formoterol USE 2 INHALATIONS ORALLY   TWICE DAILY   estradiol 0.1 MG/GM vaginal cream Commonly known as: ESTRACE FOR DIRECTIONS ON HOW TO   TAKE THIS MEDICINE, READ   THE ENCLOSED MEDICATION    INFORMATION FORM   gabapentin 300 MG capsule Commonly known as: NEURONTIN TAKE 1 CAPSULE EVERY       MORNING, TAKE 1 CAPSULE AT NOON AND TAKE 2 CAPSULES INTHE EVENING.   hydrOXYzine 25 MG capsule Commonly known as: VISTARIL TAKE 1 CAPSULE 3 TIMES A   DAY AS NEEDED FOR ANXIETY   lansoprazole 30 MG capsule Commonly known as: Prevacid Take 1 capsule (30 mg total) daily by  mouth. What changed: when to take this   levalbuterol 1.25 MG/3ML nebulizer solution Commonly known as: Xopenex Take 1.25 mg by nebulization every 4 (four) hours as needed for wheezing.   levothyroxine 50 MCG tablet Commonly known as: SYNTHROID Take 50 mcg by mouth daily before breakfast.   Linzess 145 MCG Caps capsule Generic drug: linaclotide TAKE 1 CAPSULE DAILY BEFOREBREAKFAST   MAGNESIUM OXIDE PO Take 400 mg by mouth at bedtime. Reported  on 07/28/2015   montelukast 10 MG tablet Commonly known as: SINGULAIR TAKE 1 TABLET AT BEDTIME   nystatin 100000 UNIT/ML suspension Commonly known as: MYCOSTATIN TAKE 5 MLS (500,000 UNITS TOTAL) BY MOUTH 4 (FOUR) TIMES DAILY.   olmesartan-hydrochlorothiazide 40-12.5 MG tablet Commonly known as: BENICAR HCT Take 0.5 tablets by mouth daily.   omega-3 acid ethyl esters 1 g capsule Commonly known as: LOVAZA Take 2 capsules (2 g total) by mouth 2 (two) times daily.   ondansetron 4 MG tablet Commonly known as: Zofran Take 1 tablet (4 mg total) by mouth every 8 (eight) hours as needed for nausea or vomiting.   oxyCODONE 5 MG immediate release tablet Commonly known as: Oxy IR/ROXICODONE Take 5 mg by mouth every 8 (eight) hours as needed.   polyethylene glycol 17 g packet Commonly known as: MIRALAX / GLYCOLAX Take 17 g by mouth daily as needed.   Restasis 0.05 % ophthalmic emulsion Generic drug: cycloSPORINE Place 1 drop into both eyes 2 (two) times daily.   valACYclovir 1000 MG tablet Commonly known as: VALTREX Take 1 tablet (1,000 mg total) by mouth 2 (two) times daily.   venlafaxine XR 75 MG 24 hr capsule Commonly known as: Effexor XR Take 1 capsule (75 mg total) by mouth daily with breakfast.   vitamin B-12 1000 MCG tablet Commonly known as: CYANOCOBALAMIN Take 1,000 mcg by mouth at bedtime.       Allergies:  Allergies  Allergen Reactions  . Morphine Nausea And Vomiting    Ok with nausea medication  . Lyrica  [Pregabalin] Other (See Comments)    Joint swelling  . Metformin And Related     diarrhea  . Diazepam Nausea And Vomiting    Can take with anti-nausea medication  . Latex Rash  . Rash Away  [Petrolatum-Zinc Oxide] Rash and Swelling  . Salicylates Other (See Comments)    Other reaction(s): Headache  . Tape Rash    Please use paper tape    Family History: Family History  Problem Relation Age of Onset  . Diabetes Mother   . Hypertension Mother   . Heart disease Mother   . Alzheimer's disease Mother   . Diabetes Father   . Throat cancer Father   . Hypertension Sister   . Depression Sister   . Fibromyalgia Sister   . Cancer Brother   . Heart disease Brother   . Stroke Son   . Fibromyalgia Daughter   . Bladder Cancer Neg Hx   . Kidney disease Neg Hx   . Kidney cancer Neg Hx     Social History:  reports that she has never smoked. She has never used smokeless tobacco. She reports that she does not drink alcohol or use drugs.  ROS:                                        Physical Exam: There were no vitals taken for this visit.  Constitutional:  Well nourished. Alert and oriented, No acute distress. HEENT: [place] AT, moist mucus membranes.  Trachea midline, no masses. Cardiovascular: No clubbing, cyanosis, or edema. Respiratory: Normal respiratory effort, no increased work of breathing. GI: Abdomen is soft, non tender, non distended, no abdominal masses. Liver and spleen not palpable.  No hernias appreciated.  Stool sample for occult testing is not indicated.   GU: No CVA tenderness.  No bladder fullness or masses.  ***  external genitalia, *** pubic hair distribution, no lesions.  Normal urethral meatus, no lesions, no prolapse, no discharge.   No urethral masses, tenderness and/or tenderness. No bladder fullness, tenderness or masses. *** vagina mucosa, *** estrogen effect, no discharge, no lesions, *** pelvic support, *** cystocele and *** rectocele noted.   No cervical motion tenderness.  Uterus is freely mobile and non-fixed.  No adnexal/parametria masses or tenderness noted.  Anus and perineum are without rashes or lesions.   ***  Skin: No rashes, bruises or suspicious lesions. Lymph: No cervical or inguinal adenopathy. Neurologic: Grossly intact, no focal deficits, moving all 4 extremities. Psychiatric: Normal mood and affect.   Laboratory Data: Lab Results  Component Value Date   WBC 8.4 12/11/2018   HGB 12.5 12/11/2018   HCT 39.1 12/11/2018   MCV 86.7 12/11/2018   PLT 288 12/11/2018    Lab Results  Component Value Date   CREATININE 0.88 12/11/2018    Lab Results  Component Value Date   HGBA1C 6.2 (H) 10/28/2018    Lab Results  Component Value Date   TSH 0.42 12/10/2015     Lab Results  Component Value Date   AST 11 10/28/2018   Lab Results  Component Value Date   ALT 9 10/28/2018    Urinalysis *** I have reviewed the labs  Pertinent Imaging: ***  Assessment & Plan:    1. History of hematuria  - She did complete a hematuria workup in 2015 and no GU pathology was discovered  -  Urinalysis, Complete - negative  - RTC in one year for UA  2. Vaginal atrophy:   Patient will continue the vaginal estrogen cream 3 nights weekly.  RTC in one year for an exam.    3. History of rUTI's Patient currently asymptomatic UA is bland Will return if she should experience UTI symptoms   No follow-ups on file.  These notes generated with voice recognition software. I apologize for typographical errors.  Zara Council, PA-C  [facility] [address] [place], [place] [address] [phone]

## 2019-02-07 ENCOUNTER — Encounter: Payer: Self-pay | Admitting: Orthopedic Surgery

## 2019-02-07 ENCOUNTER — Encounter: Payer: Self-pay | Admitting: Urology

## 2019-02-07 ENCOUNTER — Ambulatory Visit: Payer: Medicare Other | Admitting: Urology

## 2019-02-07 NOTE — Progress Notes (Signed)
Office Visit Note   Patient: Jacqueline Lucas           Date of Birth: 1950/11/06           MRN: [id number] Visit Date: 02/03/2019 Requested by: [name], [address],  [place] [address] PCP: [name], MD  Subjective: Chief Complaint  Patient presents with  . Left Shoulder - Pain  . Right Hip - Pain    HPI: Jacqueline Lucas is a 68 y.o. female who presents to the office complaining of left shoulder pain and right hip pain/weakness.  Patient has history of right hip replacement.  She also has history of left shoulder arthroscopy with biceps tenodesis on 09/09/2018.  Patient notes that for her right hip her pain is better in the morning.  She has trouble getting into cars as she has weakness with lifting her right leg up.  She notes pain in the groin when lifting her leg up.  Pain is been ongoing for 6 to 7 months.  She denies any fevers, chills, malaise.  Patient has history of 6 back surgeries by Dr. Retia Passe.  Patient also notes left shoulder pain in the last month since she passed out and fell onto her left shoulder.  She cannot recall the exact mechanism of her fall.  It is difficult to sleep on her left shoulder.  She is able to raise her arm functionally.  She denies any significant weakness or stiffness.  Pain is her main complaint.              ROS:  All systems reviewed are negative as they relate to the chief complaint within the history of present illness.  Patient denies fevers or chills.  Assessment & Plan: Visit Diagnoses:  1. History of right hip replacement   2. Left shoulder pain, unspecified chronicity   3. Weakness of right leg     Plan: Patient is a 68 year old female who presents complaining of left shoulder and right hip pain.  Based on history and physical exam, impression of right hip pain is that it is stemming from her back.  X-rays taken today were compared with previous x-rays of her right hip replacement and show  no  change from previous radiographs.  With her hip flexor weakness, that points more toward lumbar pathology.  As for the left shoulder, pain is her main complaint and she has decent strength in the shoulder.  She does have some pain with supraspinatus resistance testing and tenderness over the [facility] joint.  Offered cortisone injection but patient states that she would like to postpone this until she really needs one.  She has known history of rotator cuff pathology in this shoulder.  Recommended the patient follow-up with her spine surgeon regarding her right hip symptoms.  Patient will follow up with the office as needed.  Follow-Up Instructions: No follow-ups on file.   Orders:  Orders Placed This Encounter  Procedures  . XR Shoulder Left  . XR HIP UNILAT W OR W/O PELVIS 2-3 VIEWS RIGHT   No orders of the defined types were placed in this encounter.     Procedures: No procedures performed   Clinical Data: No additional findings.  Objective: Vital Signs: There were no vitals taken for this visit.  Physical Exam:  Constitutional: Patient appears well-developed HEENT:  Head: Normocephalic Eyes:EOM are normal Neck: Normal range of motion Cardiovascular: Normal rate Pulmonary/chest: Effort normal Neurologic: Patient is alert Skin: Skin is  warm Psychiatric: Patient has normal mood and affect  Ortho Exam:  Left shoulder Exam Able to fully forward flex and abduct shoulder overhead No loss of ER relative to the other shoulder.  Good endpoint with ER TTP over the [facility] joint Good subscapularis, supraspinatus, and infraspinatus strength with pain on supraspinatus resistance testing Negative Hawkins impingement 5/5 grip strength, forearm pronation/supination, and bicep strength  5/5 motor strength of bilateral lower extremity muscles except for hip flexor on right side.  4/5 motor strength of the right hip flexor muscle compared with the contralateral side.  Negative straight leg raise  bilaterally.  Sensation intact through all dermatomes of the bilateral lower extremities.  No groin pain elicited with internal rotation of the right hip.  Specialty Comments:  No specialty comments available.  Imaging: No results found.   PMFS History: Patient Active Problem List   Diagnosis Date Noted  . Superior glenoid labrum lesion of left shoulder   . Lumbar stenosis 02/04/2018  . Hx of hysterectomy 08/13/2017  . Pain in left wrist 06/12/2017  . Chronic venous insufficiency 06/09/2017  . Varicose veins of both lower extremities with pain 04/30/2017  . Claudication ([place]) 04/30/2017  . Arthritis of carpometacarpal [facility]) joint of left thumb 05/12/2016  . Bilateral thumb pain 05/01/2016  . Anemia 02/03/2016  . Status post bilateral total hip replacement 01/10/2016  . Sicca ([place]) 01/10/2016  . Age related osteoporosis 01/10/2016  . Hip arthritis 08/16/2015  . Microscopic hematuria 07/30/2015  . Vaginal atrophy 07/30/2015  . GAD (generalized anxiety disorder) 07/08/2015  . Moderate episode of recurrent major depressive disorder ([place]) 07/08/2015  . Migraine with aura and without status migrainosus, not intractable 07/08/2015  . Dyslipidemia 07/08/2015  . Degenerative arthritis of right knee 03/16/2015  . Atrophic vaginitis 01/31/2015  . Recurrent UTI 12/31/2014  . IBS (irritable bowel syndrome) 10/29/2014  . Asthma, moderate persistent 09/08/2014  . Allergic state 09/07/2014  . Colon polyp 09/07/2014  . Hemorrhoid 09/07/2014  . Constrictive tenosynovitis 03/12/2014  . H/O total knee replacement, bilateral 07/31/2013  . CAD (coronary artery disease) 11/28/2012  . Anxiety 11/28/2012  . Acid reflux 11/28/2012  . Cardiac murmur 11/28/2012  . BP (high blood pressure) 11/28/2012  . Cannot sleep 11/28/2012  . Adiposity 11/28/2012  . Restless leg 11/28/2012  . Supraventricular tachycardia ([place]) 11/28/2012  . Fibromyalgia 11/28/2012  . Tachycardia 11/28/2012   Past Medical  History:  Diagnosis Date  . Allergy   . Allergy to adhesive tape    Allergy to paper tape as well  . Alzheimer disease ([place])   . Anemia   . Anginal pain ([place])   . Anxiety   . Arthritis   . Asthma   . Bronchitis   . Cataract   . Chronic nausea   . Claustrophobia   . DDD (degenerative disc disease), lumbar   . Depression   . Dry eyes    uses Restasis  . Fibromyalgia   . GERD (gastroesophageal reflux disease)   . H/O bladder infections   . Heart murmur   . Hematuria   . Hyperlipidemia   . Hypertension   . Hypothyroidism   . IBS (irritable bowel syndrome)   . Insomnia   . Lumbar neuritis   . Migraines   . Obesity   . Osteoporosis   . Palpitation   . RLS (restless legs syndrome)   . Shoulder fracture, right   . Sicca ([place])   . Tachycardia   . Thyroid disease  Family History  Problem Relation Age of Onset  . Diabetes Mother   . Hypertension Mother   . Heart disease Mother   . Alzheimer's disease Mother   . Diabetes Father   . Throat cancer Father   . Hypertension Sister   . Depression Sister   . Fibromyalgia Sister   . Cancer Brother   . Heart disease Brother   . Stroke Son   . Fibromyalgia Daughter   . Bladder Cancer Neg Hx   . Kidney disease Neg Hx   . Kidney cancer Neg Hx     Past Surgical History:  Procedure Laterality Date  . ABDOMINAL HYSTERECTOMY     due to cancer of ovaries  . BACK SURGERY N/A 2014   x 5  . BACK SURGERY  12/2017  . CARDIAC CATHETERIZATION     no PCI; 12/18/12 ([facility], Dr. Marcello Moores): EP study with ablation. No coronary angiography done then.  . COLONOSCOPY W/ POLYPECTOMY    . COLONOSCOPY WITH PROPOFOL N/A 02/04/2016   Procedure: COLONOSCOPY WITH PROPOFOL;  Surgeon: Robert Bellow, MD;  Location[facility] ENDOSCOPY;  Service: Endoscopy;  Laterality: N/A;  . ESOPHAGOGASTRODUODENOSCOPY (EGD) WITH PROPOFOL N/A 02/04/2016   Procedure: ESOPHAGOGASTRODUODENOSCOPY (EGD) WITH PROPOFOL;  Surgeon: Robert Bellow, MD;  Location: [facility]  ENDOSCOPY;  Service: Endoscopy;  Laterality: N/A;  . FINGER ARTHROSCOPY WITH CARPOMETACARPEL ([place]) ARTHROPLASTY Left 05/01/2016   Dr. Erlinda Hong  . GIVENS CAPSULE STUDY N/A 05/02/2016   Procedure: GIVENS CAPSULE STUDY;  Surgeon: Manya Silvas, MD;  Location[facility] ENDOSCOPY;  Service: Endoscopy;  Laterality: N/A;  . HAND SURGERY    . HEMORRHOID SURGERY    . HIP ARTHROPLASTY    . JOINT REPLACEMENT Bilateral    knee  . KNEE ARTHROPLASTY    . REPLACEMENT TOTAL KNEE Left   . SHOULDER ARTHROSCOPY Left 09/09/2018   Procedure: LEFT SHOULDER ARTHROSCOPY, BICEPS TENOTOMY;  Surgeon: Meredith Pel, MD;  Location: [facility];  Service: Orthopedics;  Laterality: Left;  . SPINE SURGERY     x 2  . tendonititis     right elbow, right wrist  . TOTAL HIP ARTHROPLASTY Left 2013  . TOTAL HIP ARTHROPLASTY Right 08/16/2015   Procedure: TOTAL HIP ARTHROPLASTY ANTERIOR APPROACH;  Surgeon: Meredith Pel, MD;  Location: [facility];  Service: Orthopedics;  Laterality: Right;  . TOTAL KNEE ARTHROPLASTY Right 03/16/2015   Procedure: RIGHT TOTAL KNEE ARTHROPLASTY, PCL SACRIFICING;  Surgeon: Meredith Pel, MD;  Location: [facility];  Service: Orthopedics;  Laterality: Right;   Social History   Occupational History  . Occupation: Retired  Tobacco Use  . Smoking status: Never Smoker  . Smokeless tobacco: Never Used  . Tobacco comment: smoking cessation materials not required  Substance and Sexual Activity  . Alcohol use: No    Alcohol/week: 0.0 standard drinks  . Drug use: No  . Sexual activity: Never

## 2019-02-08 ENCOUNTER — Encounter: Payer: Self-pay | Admitting: Orthopedic Surgery

## 2019-02-11 ENCOUNTER — Other Ambulatory Visit: Payer: Self-pay | Admitting: Urology

## 2019-02-12 ENCOUNTER — Telehealth: Payer: Self-pay | Admitting: Urology

## 2019-02-12 NOTE — Telephone Encounter (Signed)
Spoke to [facility] and informed them that she does not need the titration anymore. She will just do Monday Wednesday and Friday.

## 2019-02-12 NOTE — Telephone Encounter (Signed)
Jen from [facility] called and needs a call back to clarify the dosage for Estradiol Cream.  Delsa Sale [phone]  ref # [id number]

## 2019-02-13 ENCOUNTER — Ambulatory Visit: Payer: Medicare Other | Admitting: Urology

## 2019-02-17 ENCOUNTER — Telehealth: Payer: Self-pay | Admitting: Rheumatology

## 2019-02-17 NOTE — Telephone Encounter (Signed)
Gabapentin refill sent to the pharmacy on 02/04/2019. I called the pharmacy and they have received the refill and it is scheduled to process on 02/23/2019. A 90 day supply was mailed to the patient on 12/07/2018 so patient should still have medication. I advised patient of this information, patient verbalized understanding and will check the medication bottles when she gets home.

## 2019-02-17 NOTE — Telephone Encounter (Signed)
Patient called requesting prescription refill of Gabapentin to be sent to [facility].    Patient states she called the specialty pharmacy last week for the refill and was told they would contact Dr. Estanislado Pandy.  Patient states when she called today she was told that Dr. Estanislado Pandy "has not responded to their refill request."  Patient states she is out of medication and requesting a return call.

## 2019-02-26 ENCOUNTER — Ambulatory Visit: Payer: Medicare Other | Admitting: Urology

## 2019-03-04 ENCOUNTER — Other Ambulatory Visit (INDEPENDENT_AMBULATORY_CARE_PROVIDER_SITE_OTHER): Payer: Self-pay | Admitting: Orthopedic Surgery

## 2019-03-04 NOTE — Telephone Encounter (Signed)
Ok to rf? 

## 2019-03-12 NOTE — Progress Notes (Signed)
6:59 PM   Jacqueline Lucas 04-07-1950 [id number]  Referring provider: [name], MD [address] [place],  [place] [id number]  Chief Complaint  Patient presents with  . Recurrent UTI    HPI: Patient is a 69 year old female with recurrent UTI's, vaginal atrophy and a history of hematuria who presents today for a follow up.  History of recurrent UTI's Risk factors: age, vaginal atrophy and dementia.  No documented infections in 2018.  She is not having dysuria or suprapubic pain.  She has not had fevers, chills, nausea or vomiting.    Vaginal atrophy She does have vaginal atrophy and is using vaginal estrogen cream 3 nights weekly.  History of hematuria (high risk) She does not experience dysuria or suprapubic pain.  She underwent a hematuria work up in 09/2013 with a CT Urogram and cystoscopy and no GU pathology was discovered.  She states she has been having episodes of gross hematuria that last about an hour every few months.  She states it is not associated with any pain.  Patient denies any modifying or aggravating factors.  Patient denies any dysuria or suprapubic/flank pain.  Patient denies any fevers, chills, nausea or vomiting.  UA 03/13/2019 negative.     PMH: Past Medical History:  Diagnosis Date  . Allergy   . Allergy to adhesive tape    Allergy to paper tape as well  . Alzheimer disease ([place])   . Anemia   . Anginal pain ([place])   . Anxiety   . Arthritis   . Asthma   . Bronchitis   . Cataract   . Chronic nausea   . Claustrophobia   . DDD (degenerative disc disease), lumbar   . Depression   . Dry eyes    uses Restasis  . Fibromyalgia   . GERD (gastroesophageal reflux disease)   . H/O bladder infections   . Heart murmur   . Hematuria   . Hyperlipidemia   . Hypertension   . Hypothyroidism   . IBS (irritable bowel syndrome)   . Insomnia   . Lumbar neuritis   . Migraines   . Obesity   . Osteoporosis   . Palpitation   . RLS  (restless legs syndrome)   . Shoulder fracture, right   . Sicca ([place])   . Tachycardia   . Thyroid disease     Surgical History: Past Surgical History:  Procedure Laterality Date  . ABDOMINAL HYSTERECTOMY     due to cancer of ovaries  . BACK SURGERY N/A 2014   x 5  . BACK SURGERY  12/2017  . CARDIAC CATHETERIZATION     no PCI; 12/18/12 ([facility], Dr. Marcello Moores): EP study with ablation. No coronary angiography done then.  . COLONOSCOPY W/ POLYPECTOMY    . COLONOSCOPY WITH PROPOFOL N/A 02/04/2016   Procedure: COLONOSCOPY WITH PROPOFOL;  Surgeon: Robert Bellow, MD;  Location: [facility] ENDOSCOPY;  Service: Endoscopy;  Laterality: N/A;  . ESOPHAGOGASTRODUODENOSCOPY (EGD) WITH PROPOFOL N/A 02/04/2016   Procedure: ESOPHAGOGASTRODUODENOSCOPY (EGD) WITH PROPOFOL;  Surgeon: Robert Bellow, MD;  Location: [facility] ENDOSCOPY;  Service: Endoscopy;  Laterality: N/A;  . FINGER ARTHROSCOPY WITH CARPOMETACARPEL ([place]) ARTHROPLASTY Left 05/01/2016   Dr. Erlinda Hong  . GIVENS CAPSULE STUDY N/A 05/02/2016   Procedure: GIVENS CAPSULE STUDY;  Surgeon: Manya Silvas, MD;  Location: [facility] ENDOSCOPY;  Service: Endoscopy;  Laterality: N/A;  . HAND SURGERY    . HEMORRHOID SURGERY    . HIP ARTHROPLASTY    .  JOINT REPLACEMENT Bilateral    knee  . KNEE ARTHROPLASTY    . REPLACEMENT TOTAL KNEE Left   . SHOULDER ARTHROSCOPY Left 09/09/2018   Procedure: LEFT SHOULDER ARTHROSCOPY, BICEPS TENOTOMY;  Surgeon: Meredith Pel, MD;  Location: [facility];  Service: Orthopedics;  Laterality: Left;  . SPINE SURGERY     x 2  . tendonititis     right elbow, right wrist  . TOTAL HIP ARTHROPLASTY Left 2013  . TOTAL HIP ARTHROPLASTY Right 08/16/2015   Procedure: TOTAL HIP ARTHROPLASTY ANTERIOR APPROACH;  Surgeon: Meredith Pel, MD;  Location: [facility];  Service: Orthopedics;  Laterality: Right;  . TOTAL KNEE ARTHROPLASTY Right 03/16/2015   Procedure: RIGHT TOTAL KNEE ARTHROPLASTY, PCL SACRIFICING;  Surgeon: Meredith Pel, MD;  Location: [facility];  Service: Orthopedics;  Laterality: Right;    Home Medications:  Allergies as of 03/13/2019      Reactions   Morphine Nausea And Vomiting   Ok with nausea medication   Lyrica [pregabalin] Other (See Comments)   Joint swelling   Metformin And Related    diarrhea   Diazepam Nausea And Vomiting   Can take with anti-nausea medication   Latex Rash   Rash Away  [petrolatum-zinc Oxide] Rash, Swelling   Salicylates Other (See Comments)   Other reaction(s): Headache   Tape Rash   Please use paper tape      Medication List       Accurate as of March 13, 2019 11:59 PM. If you have any questions, ask your nurse or doctor.        ALPRAZolam 0.25 MG tablet Commonly known as: XANAX Take by mouth.   aspirin 81 MG chewable tablet Chew 81 mg by mouth daily.   atorvastatin 40 MG tablet Commonly known as: LIPITOR Take 1 tablet (40 mg total) by mouth daily.   baclofen 10 MG tablet Commonly known as: LIORESAL TAKE ONE TABLET AT 7AM AND ONE TABLET AT 2PM AS NEEDED FOR MUSCLE SPASM   D3-1000 25 MCG (1000 UT) tablet Generic drug: Cholecalciferol Take 1,000 Units by mouth at bedtime.   diclofenac sodium 1 % Gel Commonly known as: Voltaren Apply 2 g topically 4 (four) times daily.   divalproex 250 MG DR tablet Commonly known as: DEPAKOTE TAKE 1 TABLET TWICE A DAY   Dulera 200-5 MCG/ACT Aero Generic drug: mometasone-formoterol USE 2 INHALATIONS ORALLY   TWICE DAILY   estradiol 0.1 MG/GM vaginal cream Commonly known as: ESTRACE APPLY A PEA SIZED AMOUNT JUST INSIDE THE VAGINAL INTROITUS WITH A FINGER TIP EVERY NIGHT FOR 2 WEEKS THEN MONDAY, WEDNESDAY, AND FRIDAY NIGHTS THEREAFTER   gabapentin 300 MG capsule Commonly known as: NEURONTIN TAKE 1 CAPSULE EVERY       MORNING, TAKE 1 CAPSULE AT NOON AND TAKE 2 CAPSULES INTHE EVENING.   hydrOXYzine 25 MG capsule Commonly known as: VISTARIL TAKE 1 CAPSULE 3 TIMES A   DAY AS NEEDED FOR ANXIETY     lansoprazole 30 MG capsule Commonly known as: Prevacid Take 1 capsule (30 mg total) daily by mouth. What changed: when to take this   levalbuterol 1.25 MG/3ML nebulizer solution Commonly known as: Xopenex Take 1.25 mg by nebulization every 4 (four) hours as needed for wheezing.   levothyroxine 50 MCG tablet Commonly known as: SYNTHROID Take 50 mcg by mouth daily before breakfast.   Linzess 145 MCG Caps capsule Generic drug: linaclotide TAKE 1 CAPSULE DAILY BEFOREBREAKFAST   MAGNESIUM OXIDE PO Take 400 mg  by mouth at bedtime. Reported on 07/28/2015   methocarbamol 500 MG tablet Commonly known as: ROBAXIN TAKE 1 TABLET BY MOUTH EVERY 8 HOURS AS NEEDED   montelukast 10 MG tablet Commonly known as: SINGULAIR TAKE 1 TABLET AT BEDTIME   nystatin 100000 UNIT/ML suspension Commonly known as: MYCOSTATIN TAKE 5 MLS (500,000 UNITS TOTAL) BY MOUTH 4 (FOUR) TIMES DAILY.   olmesartan-hydrochlorothiazide 40-12.5 MG tablet Commonly known as: BENICAR HCT Take 0.5 tablets by mouth daily.   omega-3 acid ethyl esters 1 g capsule Commonly known as: LOVAZA Take 2 capsules (2 g total) by mouth 2 (two) times daily.   ondansetron 4 MG tablet Commonly known as: Zofran Take 1 tablet (4 mg total) by mouth every 8 (eight) hours as needed for nausea or vomiting.   oxyCODONE 5 MG immediate release tablet Commonly known as: Oxy IR/ROXICODONE Take 5 mg by mouth every 8 (eight) hours as needed.   polyethylene glycol 17 g packet Commonly known as: MIRALAX / GLYCOLAX Take 17 g by mouth daily as needed.   Restasis 0.05 % ophthalmic emulsion Generic drug: cycloSPORINE Place 1 drop into both eyes 2 (two) times daily.   valACYclovir 1000 MG tablet Commonly known as: VALTREX Take 1 tablet (1,000 mg total) by mouth 2 (two) times daily.   venlafaxine XR 75 MG 24 hr capsule Commonly known as: Effexor XR Take 1 capsule (75 mg total) by mouth daily with breakfast.   vitamin B-12 1000 MCG  tablet Commonly known as: CYANOCOBALAMIN Take 1,000 mcg by mouth at bedtime.       Allergies:  Allergies  Allergen Reactions  . Morphine Nausea And Vomiting    Ok with nausea medication  . Lyrica [Pregabalin] Other (See Comments)    Joint swelling  . Metformin And Related     diarrhea  . Diazepam Nausea And Vomiting    Can take with anti-nausea medication  . Latex Rash  . Rash Away  [Petrolatum-Zinc Oxide] Rash and Swelling  . Salicylates Other (See Comments)    Other reaction(s): Headache  . Tape Rash    Please use paper tape    Family History: Family History  Problem Relation Age of Onset  . Diabetes Mother   . Hypertension Mother   . Heart disease Mother   . Alzheimer's disease Mother   . Diabetes Father   . Throat cancer Father   . Hypertension Sister   . Depression Sister   . Fibromyalgia Sister   . Cancer Brother   . Heart disease Brother   . Stroke Son   . Fibromyalgia Daughter   . Bladder Cancer Neg Hx   . Kidney disease Neg Hx   . Kidney cancer Neg Hx     Social History:  reports that she has never smoked. She has never used smokeless tobacco. She reports that she does not drink alcohol or use drugs.  ROS: UROLOGY Frequent Urination?: No Hard to postpone urination?: No Burning/pain with urination?: Yes Get up at night to urinate?: Yes Leakage of urine?: Yes Urine stream starts and stops?: No Trouble starting stream?: No Do you have to strain to urinate?: No Blood in urine?: Yes Urinary tract infection?: No Sexually transmitted disease?: No Injury to kidneys or bladder?: No Painful intercourse?: No Weak stream?: No Currently pregnant?: No Vaginal bleeding?: Yes Last menstrual period?: n  Gastrointestinal Nausea?: No Vomiting?: No Indigestion/heartburn?: Yes Diarrhea?: No Constipation?: No  Constitutional Fever: No Night sweats?: Yes Weight loss?: No Fatigue?: Yes  Skin Skin  rash/lesions?: No Itching?: Yes  Eyes Blurred  vision?: Yes Double vision?: No  Ears/Nose/Throat Sore throat?: No Sinus problems?: Yes  Hematologic/Lymphatic Swollen glands?: No Easy bruising?: No  Cardiovascular Leg swelling?: No Chest pain?: No  Respiratory Cough?: No Shortness of breath?: No  Endocrine Excessive thirst?: No  Musculoskeletal Back pain?: Yes Joint pain?: Yes  Neurological Headaches?: Yes Dizziness?: No  Psychologic Depression?: Yes Anxiety?: Yes  Physical Exam: BP 112/70   Pulse 62   Ht 5\' 4"  (1.626 m)   Wt 203 lb 11.2 oz (92.4 kg)   BMI 34.97 kg/m   Constitutional:  Well nourished. Alert and oriented, No acute distress. HEENT: [place] AT, mask in place.  Trachea midline, no masses. Cardiovascular: No clubbing, cyanosis, or edema. Respiratory: Normal respiratory effort, no increased work of breathing. GI: Abdomen is soft, non tender, non distended, no abdominal masses. Liver and spleen not palpable.  No hernias appreciated.  Stool sample for occult testing is not indicated.   GU: No CVA tenderness.  No bladder fullness or masses.  Atrophic external genitalia, sparse pubic hair distribution, no lesions.  Normal urethral meatus, no lesions, no prolapse, no discharge.   No urethral masses, tenderness and/or tenderness. No bladder fullness, tenderness or masses. Pale vagina mucosa, fair estrogen effect, no discharge, no lesions,  Fair pelvic support, grade I cystocele and no rectocele noted.  Cervix and uterus are surgically absent.  No adnexal/parametria masses or tenderness noted.  Anus and perineum are without rashes or lesions.    Skin: No rashes, bruises or suspicious lesions. Lymph: No inguinal adenopathy. Neurologic: Grossly intact, no focal deficits, moving all 4 extremities. Psychiatric: Normal mood and affect.   Laboratory Data: Lab Results  Component Value Date   WBC 8.4 12/11/2018   HGB 12.5 12/11/2018   HCT 39.1 12/11/2018   MCV 86.7 12/11/2018   PLT 288 12/11/2018    Lab Results   Component Value Date   CREATININE 0.88 12/11/2018    Lab Results  Component Value Date   HGBA1C 6.2 (H) 10/28/2018    Lab Results  Component Value Date   TSH 0.42 12/10/2015     Lab Results  Component Value Date   AST 11 10/28/2018   Lab Results  Component Value Date   ALT 9 10/28/2018    Urinalysis Component     Latest Ref Rng & Units 03/13/2019  Specific Gravity, UA     1.005 - 1.030 1.020  pH, UA     5.0 - 7.5 7.5  Color, UA     Yellow Yellow  Appearance Ur     Clear Clear  Leukocytes,UA     Negative Negative  Protein,UA     Negative/Trace Negative  Glucose, UA     Negative Negative  Ketones, UA     Negative Negative  RBC, UA     Negative Negative  Bilirubin, UA     Negative Negative  Urobilinogen, Ur     0.2 - 1.0 mg/dL 0.2  Nitrite, UA     Negative Negative  Microscopic Examination      See below:   Component     Latest Ref Rng & Units 03/13/2019  WBC, UA     0 - 5 /hpf None seen  RBC     0 - 2 /hpf None seen  Epithelial Cells (non renal)     0 - 10 /hpf 0-10  Bacteria, UA     None seen/Few None seen   I have reviewed  the labs  Pertinent Imaging: No recent imaging   Assessment & Plan:    1. Gross hematuria I explained to the patient that there are a number of causes that can be associated with blood in the urine, such as stones, UTI's, damage to the urinary tract and/or cancer. At this time, I felt that the patient warranted further urologic evaluation and AUA guidelines state that a CT urogram is the preferred imaging study to evaluate hematuria. I explained to the patient that a contrast material will be injected into a vein and that in rare instances, an allergic reaction can result and may even life threatening (1:100,000)  The patient denies any allergies to contrast, iodine and/or seafood and is not taking metformin. Following the imaging study,  I've recommended a cystoscopy. I described how this is performed, typically in an  office setting with a flexible cystoscope. We described the risks, benefits, and possible side effects, the most common of which is a minor amount of blood in the urine and/or burning which usually resolves in 24 to 48 hours.   the patient had the opportunity to ask questions which were answered. Based upon this discussion, the patient is willing to proceed. Therefore, I've ordered: a CT Urogram and cystoscopy. The patient will return following all of the above for discussion of the results.  UA    2. Vaginal atrophy:   Patient will continue the vaginal estrogen cream 3 nights weekly.  RTC in one year for an exam.    3. History of rUTI's Patient currently asymptomatic UA is bland Will return if she should experience UTI symptoms   Return for CT Urogram report and cystoscopy.  These notes generated with voice recognition software. I apologize for typographical errors.  Zara Council, PA-C  [facility] [address] [place], [place] [address] [phone]

## 2019-03-13 ENCOUNTER — Encounter: Payer: Self-pay | Admitting: Urology

## 2019-03-13 ENCOUNTER — Ambulatory Visit (INDEPENDENT_AMBULATORY_CARE_PROVIDER_SITE_OTHER): Payer: Medicare Other | Admitting: Urology

## 2019-03-13 ENCOUNTER — Other Ambulatory Visit: Payer: Self-pay

## 2019-03-13 VITALS — BP 112/70 | HR 62 | Ht 64.0 in | Wt 203.7 lb

## 2019-03-13 DIAGNOSIS — N952 Postmenopausal atrophic vaginitis: Secondary | ICD-10-CM | POA: Diagnosis not present

## 2019-03-13 DIAGNOSIS — R310 Gross hematuria: Secondary | ICD-10-CM

## 2019-03-13 DIAGNOSIS — Z87440 Personal history of urinary (tract) infections: Secondary | ICD-10-CM

## 2019-03-13 DIAGNOSIS — Z87448 Personal history of other diseases of urinary system: Secondary | ICD-10-CM

## 2019-03-13 LAB — MICROSCOPIC EXAMINATION
Bacteria, UA: NONE SEEN
RBC, Urine: NONE SEEN /hpf (ref 0–2)
WBC, UA: NONE SEEN /hpf (ref 0–5)

## 2019-03-13 LAB — URINALYSIS, COMPLETE
Bilirubin, UA: NEGATIVE
Glucose, UA: NEGATIVE
Ketones, UA: NEGATIVE
Leukocytes,UA: NEGATIVE
Nitrite, UA: NEGATIVE
Protein,UA: NEGATIVE
RBC, UA: NEGATIVE
Specific Gravity, UA: 1.02 (ref 1.005–1.030)
Urobilinogen, Ur: 0.2 mg/dL (ref 0.2–1.0)
pH, UA: 7.5 (ref 5.0–7.5)

## 2019-03-14 ENCOUNTER — Encounter: Payer: Self-pay | Admitting: Family Medicine

## 2019-03-14 ENCOUNTER — Ambulatory Visit (INDEPENDENT_AMBULATORY_CARE_PROVIDER_SITE_OTHER): Payer: Medicare Other | Admitting: Family Medicine

## 2019-03-14 VITALS — BP 112/70 | HR 62 | Ht 64.0 in | Wt 203.0 lb

## 2019-03-14 DIAGNOSIS — R5383 Other fatigue: Secondary | ICD-10-CM | POA: Diagnosis not present

## 2019-03-14 DIAGNOSIS — M797 Fibromyalgia: Secondary | ICD-10-CM

## 2019-03-14 DIAGNOSIS — D509 Iron deficiency anemia, unspecified: Secondary | ICD-10-CM | POA: Diagnosis not present

## 2019-03-14 NOTE — Progress Notes (Signed)
Name: Jacqueline Lucas   MRN: [id number]    DOB: 10/04/1950   Date:03/14/2019       Progress Note  Subjective  Chief Complaint  Chief Complaint  Patient presents with  . Fatigue  . Insomnia    Has to get up at least twice throughout the night to go to the bathroom    I connected with  [name]  on 03/14/19 at 10:40 AM EST by a video enabled telemedicine application and verified that I am speaking with the correct person using two identifiers.  I discussed the limitations of evaluation and management by telemedicine and the availability of in person appointments. The patient expressed understanding and agreed to proceed. Staff also discussed with the patient that there may be a patient responsible charge related to this service. Patient Location: at home  Provider Location: [facility]   HPI  Fatigue: she scheduled visit today because over the past few weeks she has been feeling very tired, she states only change was that when she saw Dr. Estanislado Pandy about one month ago and over the past few weeks she has noticed increase in pain ( FMS) , feeling more tired when active, also noticed pain on joints - her back , hip and shoulder. She denies worsening of depression and has been taking medication for that. She went to see Ortho and was told likely from [facility]. Advised to discuss it with Rheumatologist and see if she can go up on the dose of Gabapentin again, she has a follow up with her on 03/18/2019  Iron deficiency anemia: she was seen by Urologist recently because of vaginal bleeding, s/p hysterectomy and was diagnosed with vaginal atrophy and will have some testing done, she will also give [place] hemoccult cards done ( it was ordered on her last visit ). Explained fatigue can be secondary to anemia, but not the muscle and joint pains. We will recheck labs on her next visit   Nocturia: seeing urologist and had ua done yesterday and is getting evaluated for that   Patient  Active Problem List   Diagnosis Date Noted  . Superior glenoid labrum lesion of left shoulder   . Lumbar stenosis 02/04/2018  . Hx of hysterectomy 08/13/2017  . Pain in left wrist 06/12/2017  . Chronic venous insufficiency 06/09/2017  . Varicose veins of both lower extremities with pain 04/30/2017  . Claudication ([place]) 04/30/2017  . Arthritis of carpometacarpal [facility]) joint of left thumb 05/12/2016  . Bilateral thumb pain 05/01/2016  . Anemia 02/03/2016  . Status post bilateral total hip replacement 01/10/2016  . Sicca ([place]) 01/10/2016  . Age related osteoporosis 01/10/2016  . Hip arthritis 08/16/2015  . Microscopic hematuria 07/30/2015  . Vaginal atrophy 07/30/2015  . GAD (generalized anxiety disorder) 07/08/2015  . Moderate episode of recurrent major depressive disorder ([place]) 07/08/2015  . Migraine with aura and without status migrainosus, not intractable 07/08/2015  . Dyslipidemia 07/08/2015  . Degenerative arthritis of right knee 03/16/2015  . Atrophic vaginitis 01/31/2015  . Recurrent UTI 12/31/2014  . IBS (irritable bowel syndrome) 10/29/2014  . Asthma, moderate persistent 09/08/2014  . Allergic state 09/07/2014  . Colon polyp 09/07/2014  . Hemorrhoid 09/07/2014  . Constrictive tenosynovitis 03/12/2014  . H/O total knee replacement, bilateral 07/31/2013  . CAD (coronary artery disease) 11/28/2012  . Anxiety 11/28/2012  . Acid reflux 11/28/2012  . Cardiac murmur 11/28/2012  . BP (high blood pressure) 11/28/2012  . Cannot sleep 11/28/2012  . Adiposity 11/28/2012  .  Restless leg 11/28/2012  . Supraventricular tachycardia ([place]) 11/28/2012  . Fibromyalgia 11/28/2012  . Tachycardia 11/28/2012    Past Surgical History:  Procedure Laterality Date  . ABDOMINAL HYSTERECTOMY     due to cancer of ovaries  . BACK SURGERY N/A 2014   x 5  . BACK SURGERY  12/2017  . CARDIAC CATHETERIZATION     no PCI; 12/18/12 ([facility], Dr. Marcello Moores): EP study with ablation. No coronary  angiography done then.  . COLONOSCOPY W/ POLYPECTOMY    . COLONOSCOPY WITH PROPOFOL N/A 02/04/2016   Procedure: COLONOSCOPY WITH PROPOFOL;  Surgeon: Robert Bellow, MD;  Location: [facility] ENDOSCOPY;  Service: Endoscopy;  Laterality: N/A;  . ESOPHAGOGASTRODUODENOSCOPY (EGD) WITH PROPOFOL N/A 02/04/2016   Procedure: ESOPHAGOGASTRODUODENOSCOPY (EGD) WITH PROPOFOL;  Surgeon: Robert Bellow, MD;  Location: [facility] ENDOSCOPY;  Service: Endoscopy;  Laterality: N/A;  . FINGER ARTHROSCOPY WITH CARPOMETACARPEL ([place]) ARTHROPLASTY Left 05/01/2016   Dr. Erlinda Hong  . GIVENS CAPSULE STUDY N/A 05/02/2016   Procedure: GIVENS CAPSULE STUDY;  Surgeon: Manya Silvas, MD;  Location: [facility] ENDOSCOPY;  Service: Endoscopy;  Laterality: N/A;  . HAND SURGERY    . HEMORRHOID SURGERY    . HIP ARTHROPLASTY    . JOINT REPLACEMENT Bilateral    knee  . KNEE ARTHROPLASTY    . REPLACEMENT TOTAL KNEE Left   . SHOULDER ARTHROSCOPY Left 09/09/2018   Procedure: LEFT SHOULDER ARTHROSCOPY, BICEPS TENOTOMY;  Surgeon: Meredith Pel, MD;  Location: [place];  Service: Orthopedics;  Laterality: Left;  . SPINE SURGERY     x 2  . tendonititis     right elbow, right wrist  . TOTAL HIP ARTHROPLASTY Left 2013  . TOTAL HIP ARTHROPLASTY Right 08/16/2015   Procedure: TOTAL HIP ARTHROPLASTY ANTERIOR APPROACH;  Surgeon: Meredith Pel, MD;  Location: [place];  Service: Orthopedics;  Laterality: Right;  . TOTAL KNEE ARTHROPLASTY Right 03/16/2015   Procedure: RIGHT TOTAL KNEE ARTHROPLASTY, PCL SACRIFICING;  Surgeon: Meredith Pel, MD;  Location: [place];  Service: Orthopedics;  Laterality: Right;    Family History  Problem Relation Age of Onset  . Diabetes Mother   . Hypertension Mother   . Heart disease Mother   . Alzheimer's disease Mother   . Diabetes Father   . Throat cancer Father   . Hypertension Sister   . Depression Sister   . Fibromyalgia Sister   . Cancer Brother   . Heart disease Brother   . Stroke Son    . Fibromyalgia Daughter   . Bladder Cancer Neg Hx   . Kidney disease Neg Hx   . Kidney cancer Neg Hx       Current Outpatient Medications:  .  ALPRAZolam (XANAX) 0.25 MG tablet, Take by mouth., Disp: , Rfl:  .  aspirin 81 MG chewable tablet, Chew 81 mg by mouth daily., Disp: , Rfl:  .  atorvastatin (LIPITOR) 40 MG tablet, Take 1 tablet (40 mg total) by mouth daily., Disp: 90 tablet, Rfl: 0 .  baclofen (LIORESAL) 10 MG tablet, TAKE ONE TABLET AT 7AM AND ONE TABLET AT 2PM AS NEEDED FOR MUSCLE SPASM, Disp: 180 tablet, Rfl: 1 .  Cholecalciferol (D3-1000) 1000 units tablet, Take 1,000 Units by mouth at bedtime. , Disp: , Rfl:  .  diclofenac sodium (VOLTAREN) 1 % GEL, Apply 2 g topically 4 (four) times daily., Disp: 150 g, Rfl: 2 .  divalproex (DEPAKOTE) 250 MG DR tablet, TAKE 1 TABLET TWICE A DAY, Disp: 180 tablet,  Rfl: 1 .  DULERA 200-5 MCG/ACT AERO, USE 2 INHALATIONS ORALLY   TWICE DAILY, Disp: 39 g, Rfl: 0 .  estradiol (ESTRACE) 0.1 MG/GM vaginal cream, APPLY A PEA SIZED AMOUNT JUST INSIDE THE VAGINAL INTROITUS WITH A FINGER TIP EVERY NIGHT FOR 2 WEEKS THEN MONDAY, WEDNESDAY, AND FRIDAY NIGHTS THEREAFTER, Disp: 42.5 g, Rfl: 3 .  gabapentin (NEURONTIN) 300 MG capsule, TAKE 1 CAPSULE EVERY       MORNING, TAKE 1 CAPSULE AT NOON AND TAKE 2 CAPSULES INTHE EVENING., Disp: 360 capsule, Rfl: 0 .  hydrOXYzine (VISTARIL) 25 MG capsule, TAKE 1 CAPSULE 3 TIMES A   DAY AS NEEDED FOR ANXIETY, Disp: 90 capsule, Rfl: 0 .  levalbuterol (XOPENEX) 1.25 MG/3ML nebulizer solution, Take 1.25 mg by nebulization every 4 (four) hours as needed for wheezing., Disp: 72 mL, Rfl: 0 .  levothyroxine (SYNTHROID, LEVOTHROID) 50 MCG tablet, Take 50 mcg by mouth daily before breakfast., Disp: , Rfl:  .  LINZESS 145 MCG CAPS capsule, TAKE 1 CAPSULE DAILY BEFOREBREAKFAST, Disp: 90 capsule, Rfl: 1 .  MAGNESIUM OXIDE PO, Take 400 mg by mouth at bedtime. Reported on 07/28/2015, Disp: , Rfl:  .  methocarbamol (ROBAXIN) 500 MG  tablet, TAKE 1 TABLET BY MOUTH EVERY 8 HOURS AS NEEDED, Disp: 30 tablet, Rfl: 0 .  montelukast (SINGULAIR) 10 MG tablet, TAKE 1 TABLET AT BEDTIME, Disp: 90 tablet, Rfl: 1 .  nystatin (MYCOSTATIN) 100000 UNIT/ML suspension, TAKE 5 MLS (500,000 UNITS TOTAL) BY MOUTH 4 (FOUR) TIMES DAILY., Disp: 480 mL, Rfl: 0 .  olmesartan-hydrochlorothiazide (BENICAR HCT) 40-12.5 MG tablet, Take 0.5 tablets by mouth daily., Disp: 90 tablet, Rfl: 1 .  omega-3 acid ethyl esters (LOVAZA) 1 g capsule, Take 2 capsules (2 g total) by mouth 2 (two) times daily., Disp: 360 capsule, Rfl: 1 .  ondansetron (ZOFRAN) 4 MG tablet, Take 1 tablet (4 mg total) by mouth every 8 (eight) hours as needed for nausea or vomiting., Disp: 20 tablet, Rfl: 1 .  oxyCODONE (OXY IR/ROXICODONE) 5 MG immediate release tablet, Take 5 mg by mouth every 8 (eight) hours as needed., Disp: , Rfl:  .  polyethylene glycol (MIRALAX / GLYCOLAX) packet, Take 17 g by mouth daily as needed., Disp: , Rfl:  .  RESTASIS 0.05 % ophthalmic emulsion, Place 1 drop into both eyes 2 (two) times daily. , Disp: , Rfl:  .  valACYclovir (VALTREX) 1000 MG tablet, Take 1 tablet (1,000 mg total) by mouth 2 (two) times daily., Disp: 20 tablet, Rfl: 2 .  venlafaxine XR (EFFEXOR XR) 75 MG 24 hr capsule, Take 1 capsule (75 mg total) by mouth daily with breakfast., Disp: 90 capsule, Rfl: 1 .  vitamin B-12 (CYANOCOBALAMIN) 1000 MCG tablet, Take 1,000 mcg by mouth at bedtime., Disp: , Rfl:  .  lansoprazole (PREVACID) 30 MG capsule, Take 1 capsule (30 mg total) daily by mouth. (Patient taking differently: Take 30 mg by mouth 2 (two) times daily before a meal. ), Disp: 90 capsule, Rfl: 1  Allergies  Allergen Reactions  . Morphine Nausea And Vomiting    Ok with nausea medication  . Lyrica [Pregabalin] Other (See Comments)    Joint swelling  . Metformin And Related     diarrhea  . Diazepam Nausea And Vomiting    Can take with anti-nausea medication  . Latex Rash  . Rash Away   [Petrolatum-Zinc Oxide] Rash and Swelling  . Salicylates Other (See Comments)    Other reaction(s): Headache  . Tape Rash  Please use paper tape    I personally reviewed active problem list, medication list, allergies, family history, social history with the patient/caregiver today.   ROS  Ten systems reviewed and is negative except as mentioned in HPI   Objective  Virtual encounter, vitals not obtained.  Body mass index is 34.84 kg/m.  Physical Exam   Awake, alert and oriented  Results for orders placed or performed in visit on 03/13/19 (from the past 72 hour(s))  Urinalysis, Complete     Status: None   Collection Time: 03/13/19  3:22 PM  Result Value Ref Range   Specific Gravity, UA 1.020 1.005 - 1.030   pH, UA 7.5 5.0 - 7.5   Color, UA Yellow Yellow   Appearance Ur Clear Clear   Leukocytes,UA Negative Negative   Protein,UA Negative Negative/Trace   Glucose, UA Negative Negative   Ketones, UA Negative Negative   RBC, UA Negative Negative   Bilirubin, UA Negative Negative   Urobilinogen, Ur 0.2 0.2 - 1.0 mg/dL   Nitrite, UA Negative Negative   Microscopic Examination See below:   Microscopic Examination     Status: None   Collection Time: 03/13/19  3:22 PM   URINE  Result Value Ref Range   WBC, UA None seen 0 - 5 /hpf   RBC None seen 0 - 2 /hpf   Epithelial Cells (non renal) 0-10 0 - 10 /hpf   Bacteria, UA None seen None seen/Few    PHQ2/9: Depression screen [facility] 2/9 03/14/2019 01/27/2019 12/23/2018 10/28/2018 09/24/2018  Decreased Interest 0 0 2 0 1  Down, Depressed, Hopeless 0 0 2 0 1  PHQ - 2 Score 0 0 4 0 2  Altered sleeping 0 2 3 3  0  Tired, decreased energy 0 1 2 2 1   Change in appetite 0 0 1 1 0  Feeling bad or failure about yourself  0 0 0 0 0  Trouble concentrating 0 0 1 0 0  Moving slowly or fidgety/restless 0 0 1 0 0  Suicidal thoughts 0 0 0 0 0  PHQ-9 Score 0 3 12 6 3   Difficult doing work/chores Not difficult at all Not difficult at all  Somewhat difficult Somewhat difficult Not difficult at all  Some recent data might be hidden   PHQ-2/9 Result is negative.    Fall Risk: Fall Risk  03/14/2019 01/27/2019 12/26/2018 12/23/2018 12/09/2018  Falls in the past year? 1 1 0 1 0  Number falls in past yr: 0 0 - 0 -  Injury with Fall? 0 0 - 0 -  Risk Factor Category  - - - - -  Risk for fall due to : - - - - -  Risk for fall due to: Comment - - - - -  Follow up - - - - -     Assessment & Plan   1. Fibromyalgia  She has follow up with Rheumatologist and will ask if she can go up on the dose again   2. Fatigue, unspecified type  Likely from decrease dose of gabapentin that is causing increase in pain   3. Iron deficiency anemia, unspecified iron deficiency anemia type  She will send hemoccult cards back , recheck labs next visit   I discussed the assessment and treatment plan with the patient. The patient was provided an opportunity to ask questions and all were answered. The patient agreed with the plan and demonstrated an understanding of the instructions.  The patient was advised to call  back or seek an in-person evaluation if the symptoms worsen or if the condition fails to improve as anticipated.  I provided 15 minutes of non-face-to-face time during this encounter.

## 2019-03-18 ENCOUNTER — Telehealth: Payer: Self-pay | Admitting: Rheumatology

## 2019-03-18 ENCOUNTER — Ambulatory Visit: Payer: Medicare Other | Admitting: Rheumatology

## 2019-03-18 NOTE — Telephone Encounter (Signed)
Patient scheduled an appointment for bilateral hip pain.

## 2019-03-18 NOTE — Progress Notes (Signed)
Virtual Visit via Telephone Note  I connected with Jacqueline Lucas on 03/19/19 at 10:30 AM EST by telephone and verified that I am speaking with the correct person using two identifiers.  Location: Patient: Home Provider: Clinic  This service was conducted via virtual visit.   The patient was located at home. I was located in my office.  Consent was obtained prior to the virtual visit and is aware of possible charges through their insurance for this visit.  The patient is an established patient.  Dr. Estanislado Pandy, MD conducted the virtual visit and Hazel Sams, PA-C acted as scribe during the service.  Office staff helped with scheduling follow up visits after the service was conducted.   I discussed the limitations, risks, security and privacy concerns of performing an evaluation and management service by telephone and the availability of in person appointments. I also discussed with the patient that there may be a patient responsible charge related to this service. The patient expressed understanding and agreed to proceed.  CC: Left shoulder pain  History of Present Illness: Patient is a 69 year old female with a past medical history of fibromyalgia, osteoarthritis, and DDD. She is taking gabapentin 300 mg 1 capsule in the morning, 1 capsule at noon, and 2 capsules at bedtime.  She has reduced the dose of gabapentin and is having more generalized pain.  She takes baclofen 10 mg 1 tablet BID prn for muscle spasms.  She is currently having a fibromyalgia flare. She has been experiencing left shoulder joint pain and right hip discomfort. Her right hip is replaced. She states she fell in November and landed on her left shoulder, which has exacerbated her discomfort.  She has seen Dr. Marlou Sa for updated x-rays.    Review of Systems  Constitutional: Positive for malaise/fatigue. Negative for fever.  Eyes: Negative for photophobia, pain, discharge and redness.       +Eye dryness   Respiratory: Negative  for cough, shortness of breath and wheezing.   Cardiovascular: Negative for chest pain and palpitations.  Gastrointestinal: Negative for blood in stool, constipation and diarrhea.  Genitourinary: Negative for dysuria.  Musculoskeletal: Positive for back pain, joint pain and myalgias. Negative for neck pain.  Skin: Negative for rash.  Neurological: Negative for dizziness and headaches.  Psychiatric/Behavioral: Negative for depression. The patient has insomnia. The patient is not nervous/anxious.       Observations/Objective: Physical Exam  Constitutional: She is oriented to person, place, and time.  Neurological: She is alert and oriented to person, place, and time.  Psychiatric: Mood, memory, affect and judgment normal.   Patient reports morning stiffness for 1 hour.   Patient reports nocturnal pain.  Difficulty dressing/grooming: Denies Difficulty climbing stairs: Reports Difficulty getting out of chair: Denies Difficulty using hands for taps, buttons, cutlery, and/or writing: Denies   Assessment and Plan: Visit Diagnoses: Fibromyalgia: She has been experiencing more frequent and severe fibromyalgia flares. She is having generalized muscle aches and muscle tenderness. She has noticed more generalized pain since reducing the dose of gabapentin.  We will increase gabapentin 300 mg 2 capsules TID.  She will continue taking baclofen 10 mg BID as needed for muscle spasms.  We discussed the importance of regular exercise and good sleep hygiene. She will follow up in 6 months.   Trochanteric bursitis of both hips: She has bilateral trochanteric bursitis.  She experiencing nocturnal pain when laying on her sides at night. She had cortisone injections bilaterally on 11/28/18.  Chronic left shoulder pain: She has been experiencing significant left shoulder joint pain since November after falling on her left side.  She was evaluated by Gloriann Loan on 02/03/19 and x-rays were obtained.  She  declined a cortisone injection at that time. She was advised to notify us if she would like to return for a cortisone injection.   DDD (degenerative disc disease), lumbar: She has chronic lower back pain. She is not experiencing any symptoms of radiculopathy at this time. She takes baclofen 10 mg BID prn for muscle spasms.   Status post bilateral total hip replacement: She has been experiencing increased right hip discomfort since falling several months ago.  She was evaluated by  Gloriann Loan, PA-C on 02/03/19.  X-rays were obtained which did not show any change when compared to previous x-rays.  She is not having any left hip discomfort at this time.   H/O total knee replacement, bilateral: Doing well.  She has no discomfort at this time.   Primary osteoarthritis of first carpometacarpal joint of left hand: She has ongoing left CMC joint discomfort.  Joint protection and muscle strengthening were discussed.   Keratoconjunctivitis sicca ([place]): She has chronic eye dryness.  She uses restasis eye drops daily.  Osteopenia of multiple sites: She is on vitamin D 1000 units daily. She has bilateral hip replacements and spinal surgery. Last DEXA on 09/25/2017 ordered by Dr. Ancil Boozer showed T score of 0.0 at radius.    Other medical conditions are listed as follows:   History of hypertension  History of anxiety  History of depression  History of cardiac murmur  Follow Up Instructions: She will follow up in  6 months   I discussed the assessment and treatment plan with the patient. The patient was provided an opportunity to ask questions and all were answered. The patient agreed with the plan and demonstrated an understanding of the instructions.   The patient was advised to call back or seek an in-person evaluation if the symptoms worsen or if the condition fails to improve as anticipated.  I provided 25 minutes of non-face-to-face time during this encounter.   Jacqueline Merino,  MD   Scribed byLovena Le dale PA-C

## 2019-03-19 ENCOUNTER — Encounter: Payer: Self-pay | Admitting: Rheumatology

## 2019-03-19 ENCOUNTER — Telehealth (INDEPENDENT_AMBULATORY_CARE_PROVIDER_SITE_OTHER): Payer: Medicare Other | Admitting: Rheumatology

## 2019-03-19 ENCOUNTER — Other Ambulatory Visit: Payer: Self-pay | Admitting: *Deleted

## 2019-03-19 ENCOUNTER — Other Ambulatory Visit: Payer: Self-pay

## 2019-03-19 DIAGNOSIS — Z96653 Presence of artificial knee joint, bilateral: Secondary | ICD-10-CM

## 2019-03-19 DIAGNOSIS — M1812 Unilateral primary osteoarthritis of first carpometacarpal joint, left hand: Secondary | ICD-10-CM | POA: Diagnosis not present

## 2019-03-19 DIAGNOSIS — M5136 Other intervertebral disc degeneration, lumbar region: Secondary | ICD-10-CM

## 2019-03-19 DIAGNOSIS — Z862 Personal history of diseases of the blood and blood-forming organs and certain disorders involving the immune mechanism: Secondary | ICD-10-CM

## 2019-03-19 DIAGNOSIS — M8589 Other specified disorders of bone density and structure, multiple sites: Secondary | ICD-10-CM

## 2019-03-19 DIAGNOSIS — Z8719 Personal history of other diseases of the digestive system: Secondary | ICD-10-CM

## 2019-03-19 DIAGNOSIS — G2581 Restless legs syndrome: Secondary | ICD-10-CM

## 2019-03-19 DIAGNOSIS — M7062 Trochanteric bursitis, left hip: Secondary | ICD-10-CM

## 2019-03-19 DIAGNOSIS — M7061 Trochanteric bursitis, right hip: Secondary | ICD-10-CM | POA: Diagnosis not present

## 2019-03-19 DIAGNOSIS — Z96643 Presence of artificial hip joint, bilateral: Secondary | ICD-10-CM

## 2019-03-19 DIAGNOSIS — M51369 Other intervertebral disc degeneration, lumbar region without mention of lumbar back pain or lower extremity pain: Secondary | ICD-10-CM

## 2019-03-19 DIAGNOSIS — Z8679 Personal history of other diseases of the circulatory system: Secondary | ICD-10-CM | POA: Diagnosis not present

## 2019-03-19 DIAGNOSIS — M25512 Pain in left shoulder: Secondary | ICD-10-CM

## 2019-03-19 DIAGNOSIS — Z8659 Personal history of other mental and behavioral disorders: Secondary | ICD-10-CM

## 2019-03-19 DIAGNOSIS — M797 Fibromyalgia: Secondary | ICD-10-CM

## 2019-03-19 DIAGNOSIS — Z7982 Long term (current) use of aspirin: Secondary | ICD-10-CM

## 2019-03-19 DIAGNOSIS — M3501 Sicca syndrome with keratoconjunctivitis: Secondary | ICD-10-CM | POA: Diagnosis not present

## 2019-03-19 DIAGNOSIS — G8929 Other chronic pain: Secondary | ICD-10-CM

## 2019-03-19 DIAGNOSIS — H16229 Keratoconjunctivitis sicca, not specified as Sjogren's, unspecified eye: Secondary | ICD-10-CM

## 2019-03-19 MED ORDER — GABAPENTIN 300 MG PO CAPS: ORAL_CAPSULE | ORAL | 0 refills | Status: DC

## 2019-03-20 ENCOUNTER — Ambulatory Visit: Payer: Medicare Other | Admitting: Rheumatology

## 2019-03-21 DIAGNOSIS — H2511 Age-related nuclear cataract, right eye: Secondary | ICD-10-CM | POA: Diagnosis not present

## 2019-03-21 DIAGNOSIS — I4891 Unspecified atrial fibrillation: Secondary | ICD-10-CM | POA: Diagnosis not present

## 2019-03-26 ENCOUNTER — Other Ambulatory Visit: Payer: Self-pay

## 2019-03-26 ENCOUNTER — Ambulatory Visit
Admission: RE | Admit: 2019-03-26 | Discharge: 2019-03-26 | Disposition: A | Discharge status: Home / Self Care | Payer: Medicare Other | Source: Ambulatory Visit | Attending: Urology | Admitting: Urology

## 2019-03-26 ENCOUNTER — Encounter: Payer: Self-pay | Admitting: Ophthalmology

## 2019-03-26 DIAGNOSIS — R310 Gross hematuria: Secondary | ICD-10-CM | POA: Diagnosis not present

## 2019-03-26 DIAGNOSIS — Z87440 Personal history of urinary (tract) infections: Secondary | ICD-10-CM | POA: Insufficient documentation

## 2019-03-26 LAB — POCT I-STAT CREATININE: Creatinine, Ser: 0.8 mg/dL (ref 0.44–1.00)

## 2019-03-26 MED ORDER — IOHEXOL 300 MG/ML  SOLN
125.0000 mL | Freq: Once | INTRAMUSCULAR | Status: AC | PRN
  Administered 2019-03-26: 125 mL via INTRAVENOUS

## 2019-03-28 ENCOUNTER — Other Ambulatory Visit: Payer: Self-pay

## 2019-03-28 ENCOUNTER — Other Ambulatory Visit
Admission: RE | Admit: 2019-03-28 | Discharge: 2019-03-28 | Disposition: A | Discharge status: Home / Self Care | Payer: Medicare Other | Source: Ambulatory Visit | Attending: Ophthalmology | Admitting: Ophthalmology

## 2019-03-28 DIAGNOSIS — Z20822 Contact with and (suspected) exposure to covid-19: Secondary | ICD-10-CM | POA: Insufficient documentation

## 2019-03-28 DIAGNOSIS — Z01812 Encounter for preprocedural laboratory examination: Secondary | ICD-10-CM | POA: Diagnosis not present

## 2019-03-28 LAB — SARS CORONAVIRUS 2 (TAT 6-24 HRS): SARS Coronavirus 2: NEGATIVE

## 2019-03-28 NOTE — Discharge Instructions (Signed)

## 2019-04-01 ENCOUNTER — Ambulatory Visit: Payer: Medicare Other | Admitting: Anesthesiology

## 2019-04-01 ENCOUNTER — Ambulatory Visit
Admission: RE | Admit: 2019-04-01 | Discharge: 2019-04-01 | Disposition: A | Discharge status: Home / Self Care | Payer: Medicare Other | Attending: Ophthalmology | Admitting: Ophthalmology

## 2019-04-01 ENCOUNTER — Other Ambulatory Visit: Payer: Self-pay

## 2019-04-01 ENCOUNTER — Encounter: Payer: Self-pay | Admitting: Ophthalmology

## 2019-04-01 ENCOUNTER — Encounter
Admission: RE | Disposition: A | Discharge status: Home / Self Care | Payer: Self-pay | Source: Home / Self Care | Attending: Ophthalmology

## 2019-04-01 DIAGNOSIS — Z79899 Other long term (current) drug therapy: Secondary | ICD-10-CM | POA: Diagnosis not present

## 2019-04-01 DIAGNOSIS — F319 Bipolar disorder, unspecified: Secondary | ICD-10-CM | POA: Diagnosis not present

## 2019-04-01 DIAGNOSIS — I10 Essential (primary) hypertension: Secondary | ICD-10-CM | POA: Insufficient documentation

## 2019-04-01 DIAGNOSIS — Z888 Allergy status to other drugs, medicaments and biological substances status: Secondary | ICD-10-CM | POA: Insufficient documentation

## 2019-04-01 DIAGNOSIS — I4891 Unspecified atrial fibrillation: Secondary | ICD-10-CM | POA: Diagnosis not present

## 2019-04-01 DIAGNOSIS — Z79890 Hormone replacement therapy (postmenopausal): Secondary | ICD-10-CM | POA: Diagnosis not present

## 2019-04-01 DIAGNOSIS — J449 Chronic obstructive pulmonary disease, unspecified: Secondary | ICD-10-CM | POA: Diagnosis not present

## 2019-04-01 DIAGNOSIS — E7800 Pure hypercholesterolemia, unspecified: Secondary | ICD-10-CM | POA: Insufficient documentation

## 2019-04-01 DIAGNOSIS — H25811 Combined forms of age-related cataract, right eye: Secondary | ICD-10-CM | POA: Diagnosis not present

## 2019-04-01 DIAGNOSIS — H2511 Age-related nuclear cataract, right eye: Secondary | ICD-10-CM | POA: Diagnosis not present

## 2019-04-01 DIAGNOSIS — Z96643 Presence of artificial hip joint, bilateral: Secondary | ICD-10-CM | POA: Diagnosis not present

## 2019-04-01 DIAGNOSIS — Z6834 Body mass index (BMI) 34.0-34.9, adult: Secondary | ICD-10-CM | POA: Diagnosis not present

## 2019-04-01 DIAGNOSIS — E039 Hypothyroidism, unspecified: Secondary | ICD-10-CM | POA: Insufficient documentation

## 2019-04-01 DIAGNOSIS — Z7982 Long term (current) use of aspirin: Secondary | ICD-10-CM | POA: Insufficient documentation

## 2019-04-01 DIAGNOSIS — M797 Fibromyalgia: Secondary | ICD-10-CM | POA: Diagnosis not present

## 2019-04-01 HISTORY — PX: CATARACT EXTRACTION W/PHACO: SHX586

## 2019-04-01 HISTORY — DX: Presence of dental prosthetic device (complete) (partial): Z97.2

## 2019-04-01 SURGERY — PHACOEMULSIFICATION, CATARACT, WITH IOL INSERTION
Anesthesia: Monitor Anesthesia Care | Site: Eye | Laterality: Right

## 2019-04-01 MED ORDER — EPINEPHRINE PF 1 MG/ML IJ SOLN
INTRAOCULAR | Status: DC | PRN
  Administered 2019-04-01: 09:00:00 43 mL via OPHTHALMIC

## 2019-04-01 MED ORDER — FENTANYL CITRATE (PF) 100 MCG/2ML IJ SOLN
INTRAMUSCULAR | Status: DC | PRN
  Administered 2019-04-01: 50 ug via INTRAVENOUS

## 2019-04-01 MED ORDER — NA CHONDROIT SULF-NA HYALURON 40-17 MG/ML IO SOLN
INTRAOCULAR | Status: DC | PRN
  Administered 2019-04-01: 1 mL via INTRAOCULAR

## 2019-04-01 MED ORDER — LACTATED RINGERS IV SOLN: INTRAVENOUS | Status: DC

## 2019-04-01 MED ORDER — LIDOCAINE HCL (PF) 2 % IJ SOLN
INTRAOCULAR | Status: DC | PRN
  Administered 2019-04-01: 09:00:00 2 mL

## 2019-04-01 MED ORDER — MIDAZOLAM HCL 2 MG/2ML IJ SOLN
INTRAMUSCULAR | Status: DC | PRN
  Administered 2019-04-01: 2 mg via INTRAVENOUS

## 2019-04-01 MED ORDER — ARMC OPHTHALMIC DILATING DROPS
1.0000 "application " | OPHTHALMIC | Status: DC | PRN
  Administered 2019-04-01 (×3): 1 via OPHTHALMIC

## 2019-04-01 MED ORDER — TETRACAINE HCL 0.5 % OP SOLN
1.0000 [drp] | OPHTHALMIC | Status: DC | PRN
  Administered 2019-04-01 (×3): 1 [drp] via OPHTHALMIC

## 2019-04-01 MED ORDER — BRIMONIDINE TARTRATE-TIMOLOL 0.2-0.5 % OP SOLN
OPHTHALMIC | Status: DC | PRN
  Administered 2019-04-01: 1 [drp] via OPHTHALMIC

## 2019-04-01 MED ORDER — ONDANSETRON HCL 4 MG/2ML IJ SOLN
INTRAMUSCULAR | Status: DC | PRN
  Administered 2019-04-01: 4 mg via INTRAVENOUS

## 2019-04-01 MED ORDER — MOXIFLOXACIN HCL 0.5 % OP SOLN
OPHTHALMIC | Status: DC | PRN
  Administered 2019-04-01: 0.2 mL via OPHTHALMIC

## 2019-04-01 SURGICAL SUPPLY — 21 items
CANNULA ANT/CHMB 27G (MISCELLANEOUS) ×2 IMPLANT
CANNULA ANT/CHMB 27GA (MISCELLANEOUS) ×6 IMPLANT
GLOVE SURG LX 8.0 MICRO (GLOVE) ×2
GLOVE SURG LX STRL 8.0 MICRO (GLOVE) ×1 IMPLANT
GLOVE SURG TRIUMPH 8.0 PF LTX (GLOVE) ×3 IMPLANT
GOWN STRL REUS W/ TWL LRG LVL3 (GOWN DISPOSABLE) ×2 IMPLANT
GOWN STRL REUS W/TWL LRG LVL3 (GOWN DISPOSABLE) ×6
LENS IOL ACRSF IQ TRC 4 18.0 IMPLANT
LENS IOL ACRYSOF IQ TORIC 18.0 ×3 IMPLANT
LENS IOL IQ TORIC 4 18.0 ×1 IMPLANT
MARKER SKIN DUAL TIP RULER LAB (MISCELLANEOUS) ×3 IMPLANT
NDL FILTER BLUNT 18X1 1/2 (NEEDLE) ×1 IMPLANT
NEEDLE FILTER BLUNT 18X 1/2SAF (NEEDLE) ×2
NEEDLE FILTER BLUNT 18X1 1/2 (NEEDLE) ×1 IMPLANT
PACK EYE AFTER SURG (MISCELLANEOUS) ×3 IMPLANT
PACK OPTHALMIC (MISCELLANEOUS) ×3 IMPLANT
PACK PORFILIO (MISCELLANEOUS) ×3 IMPLANT
SYR 3ML LL SCALE MARK (SYRINGE) ×3 IMPLANT
SYR TB 1ML LUER SLIP (SYRINGE) ×3 IMPLANT
WATER STERILE IRR 250ML POUR (IV SOLUTION) ×3 IMPLANT
WIPE NON LINTING 3.25X3.25 (MISCELLANEOUS) ×3 IMPLANT

## 2019-04-01 NOTE — Transfer of Care (Signed)
Immediate Anesthesia Transfer of Care Note  Patient: Jacqueline Lucas  Procedure(s) Performed: CATARACT EXTRACTION PHACO AND INTRAOCULAR LENS PLACEMENT (IOC) RIGHT  TORIC 6.19 00:40.1 (Right Eye)  Patient Location: PACU  Anesthesia Type: MAC  Level of Consciousness: awake, alert  and patient cooperative  Airway and Oxygen Therapy: Patient Spontanous Breathing and Patient connected to supplemental oxygen  Post-op Assessment: Post-op Vital signs reviewed, Patient's Cardiovascular Status Stable, Respiratory Function Stable, Patent Airway and No signs of Nausea or vomiting  Post-op Vital Signs: Reviewed and stable  Complications: No apparent anesthesia complications

## 2019-04-01 NOTE — Anesthesia Procedure Notes (Signed)
Procedure Name: MAC Performed by: Jamarques Pinedo, CRNA Pre-anesthesia Checklist: Patient identified, Emergency Drugs available, Suction available, Timeout performed and Patient being monitored Patient Re-evaluated:Patient Re-evaluated prior to induction Oxygen Delivery Method: Nasal cannula Placement Confirmation: positive ETCO2       

## 2019-04-01 NOTE — Op Note (Signed)
PREOPERATIVE DIAGNOSIS:  Nuclear sclerotic cataract of the right eye.   POSTOPERATIVE DIAGNOSIS:  Nuclear sclerotic cataract of the right eye.   OPERATIVE PROCEDURE: Procedure(s): CATARACT EXTRACTION PHACO AND INTRAOCULAR LENS PLACEMENT (IOC) RIGHT  TORIC 6.19 00:40.1   SURGEON:  Birder Robson, MD.   ANESTHESIA: 1.      Managed anesthesia care. 2.     0.57ml of Shugarcaine was instilled following the paracentesis  Anesthesiologist: Fidel Levy, MD CRNA: Mayme Genta, CRNA  COMPLICATIONS:  None.   TECHNIQUE:   Stop and chop    DESCRIPTION OF PROCEDURE:  The patient was examined and consented in the preoperative holding area where the aforementioned topical anesthesia was applied to the right eye.  The patient was brought back to the Operating Room where he was sat upright on the gurney and given a target to fixate upon while the eye was marked at the 3:00 and 9:00 position.  The patient was then reclined on the operating table.  The eye was prepped and draped in the usual sterile ophthalmic fashion and a lid speculum was placed. A paracentesis was created with the side port blade and the anterior chamber was filled with viscoelastic. A near clear corneal incision was performed with the steel keratome. A continuous curvilinear capsulorrhexis was performed with a cystotome followed by the capsulorrhexis forceps. Hydrodissection and hydrodelineation were carried out with BSS on a blunt cannula. The lens was removed in a stop and chop technique and the remaining cortical material was removed with the irrigation-aspiration handpiece. The eye was inflated with viscoelastic and the SNAT4   lens  was placed in the eye and rotated to within a few degrees of the predetermined orientation.  The remaining viscoelastic was removed from the eye.  The [place] hook was used to rotate the toric lens into its final resting place at 109 degrees.  The eye was inflated to a physiologic pressure and found to be  watertight. 0.77ml of Vigamox was placed in the anterior chamber.  The eye was dressed with Vigamox. The patient was given protective glasses to wear throughout the day and a shield with which to sleep tonight. The patient was also given drops with which to begin a drop regimen today and will follow-up with me in one day. Implant Name Type Inv. Item Serial No. Manufacturer Lot No. LRB No. Used Action  LENS IOL TORIC 18.0 - [id number]  LENS IOL TORIC 18.0 [id number] ALCON  Right 1 Implanted   Procedure(s): CATARACT EXTRACTION PHACO AND INTRAOCULAR LENS PLACEMENT (IOC) RIGHT  TORIC 6.19 00:40.1 (Right)  Electronically signed: Birder Robson 04/01/2019 9:17 AM

## 2019-04-01 NOTE — H&P (Signed)
All labs reviewed. Abnormal studies sent to patients PCP when indicated.  Previous H&P reviewed, patient examined, there are NO CHANGES.  Jacqueline Lucas Porfilio2/2/20218:48 AM

## 2019-04-01 NOTE — Anesthesia Postprocedure Evaluation (Signed)
Anesthesia Post Note  Patient: Jacqueline Lucas  Procedure(s) Performed: CATARACT EXTRACTION PHACO AND INTRAOCULAR LENS PLACEMENT (IOC) RIGHT  TORIC 6.19 00:40.1 (Right Eye)     Patient location during evaluation: PACU Anesthesia Type: MAC Level of consciousness: awake and alert Pain management: pain level controlled Vital Signs Assessment: post-procedure vital signs reviewed and stable Respiratory status: spontaneous breathing, nonlabored ventilation, respiratory function stable and patient connected to nasal cannula oxygen Cardiovascular status: stable and blood pressure returned to baseline Postop Assessment: no apparent nausea or vomiting Anesthetic complications: no    Linsey Hirota

## 2019-04-01 NOTE — Anesthesia Preprocedure Evaluation (Signed)
Anesthesia Evaluation  Patient identified by MRN, date of birth, ID band Patient awake    Reviewed: NPO status   History of Anesthesia Complications Negative for: history of anesthetic complications  Airway Mallampati: II  TM Distance: >3 FB Neck ROM: full    Dental  (+) Partial Lower   Pulmonary asthma (mild) ,    Pulmonary exam normal        Cardiovascular Exercise Tolerance: Good hypertension, + dysrhythmias (ablation for svt ?) Supra Ventricular Tachycardia      Neuro/Psych  Headaches (on oxycodone), Anxiety Depression Bipolar Disorder fibromyalgia    GI/Hepatic Neg liver ROS, GERD  Controlled,ibs   Endo/Other  Hypothyroidism Morbid obesity (bmi 35)  Renal/GU negative Renal ROS  negative genitourinary   Musculoskeletal  (+) Arthritis , Fibromyalgia -  Abdominal   Peds  Hematology negative hematology ROS (+)   Anesthesia Other Findings Covid: NEG.   Jobe Gibbon, MD at 11/13/2018  : cards.  Reproductive/Obstetrics                             Anesthesia Physical Anesthesia Plan  ASA: III  Anesthesia Plan: MAC   Post-op Pain Management:    Induction:   PONV Risk Score and Plan: 2 and Midazolam and TIVA  Airway Management Planned:   Additional Equipment:   Intra-op Plan:   Post-operative Plan:   Informed Consent: I have reviewed the patients History and Physical, chart, labs and discussed the procedure including the risks, benefits and alternatives for the proposed anesthesia with the patient or authorized representative who has indicated his/her understanding and acceptance.       Plan Discussed with: CRNA  Anesthesia Plan Comments:         Anesthesia Quick Evaluation

## 2019-04-02 ENCOUNTER — Encounter: Payer: Self-pay | Admitting: *Deleted

## 2019-04-10 DIAGNOSIS — H2512 Age-related nuclear cataract, left eye: Secondary | ICD-10-CM | POA: Diagnosis not present

## 2019-04-13 ENCOUNTER — Other Ambulatory Visit: Payer: Self-pay | Admitting: Family Medicine

## 2019-04-13 DIAGNOSIS — G43109 Migraine with aura, not intractable, without status migrainosus: Secondary | ICD-10-CM

## 2019-04-13 DIAGNOSIS — F3160 Bipolar disorder, current episode mixed, unspecified: Secondary | ICD-10-CM

## 2019-04-13 DIAGNOSIS — I10 Essential (primary) hypertension: Secondary | ICD-10-CM

## 2019-04-13 DIAGNOSIS — E785 Hyperlipidemia, unspecified: Secondary | ICD-10-CM

## 2019-04-13 NOTE — Telephone Encounter (Signed)
Requested Prescriptions  Pending Prescriptions Disp Refills  . venlafaxine XR (EFFEXOR-XR) 75 MG 24 hr capsule [Pharmacy Med Name: VENLAFAXINE HCL ER 75 MG CAP] 90 capsule 1    Sig: TAKE 1 CAPSULE (75 MG TOTAL) BY MOUTH DAILY WITH BREAKFAST.     Psychiatry: Antidepressants - SNRI - desvenlafaxine & venlafaxine Failed - 04/13/2019 10:06 AM      Failed - Total Cholesterol in normal range and within 360 days    Cholesterol  Date Value Ref Range Status  10/28/2018 200 (H) <200 mg/dL Final         Failed - Triglycerides in normal range and within 360 days    Triglycerides  Date Value Ref Range Status  10/28/2018 538 (H) <150 mg/dL Final    Comment:    . If a non-fasting specimen was collected, consider repeat triglyceride testing on a fasting specimen if clinically indicated.  Yates Decamp et al. J. of Clin. Lipidol. [id number]. . . There is increased risk of pancreatitis when the  triglyceride concentration is very high  (> or = 500 mg/dL, especially if > or = 1000 mg/dL).  Yates Decamp et al. J. of Clin. Lipidol. [id number]. [name]          Passed - LDL in normal range and within 360 days    LDL Cholesterol (Calc)  Date Value Ref Range Status  10/28/2018  mg/dL (calc) Final    Comment:    . LDL cholesterol not calculated. Triglyceride levels greater than 400 mg/dL invalidate calculated LDL results. . Reference range: <100 . Desirable range <100 mg/dL for primary prevention;   <70 mg/dL for patients with CHD or diabetic patients  with > or = 2 CHD risk factors. [name] LDL-C is now calculated using the Martin-Hopkins  calculation, which is a validated novel method providing  better accuracy than the Friedewald equation in the  estimation of LDL-C.  Cresenciano Genre et al. Annamaria Helling. [id number]): 2061-2068  (http://education.[facility].com/faq/FAQ164)          Passed - Completed PHQ-2 or PHQ-9 in the last 360 days.      Passed - Last BP in normal range    BP Readings from Last 1  Encounters:  04/01/19 119/60         Passed - Valid encounter within last 6 months    Recent Outpatient Visits          1 month ago [name], [facility]   2 months ago COPD with asthma [facility])   [facility] [name], MD   3 months ago History of syncope   [facility] [name], MD   5 months ago Mass of left axilla   [facility] [name], MD   8 months ago Other constipation   [facility] [name], MD      Future Appointments            In 5 months [facility] Skye Blinda [facility] [place]   In 5 months  [facility], [place]           . atorvastatin (LIPITOR) 40 MG tablet [Pharmacy Med Name: ATORVASTATIN 40 MG TABLET] 90 tablet 0    Sig: TAKE 1 TABLET BY MOUTH EVERYDAY AT BEDTIME     Cardiovascular:  Antilipid - Statins Failed - 04/13/2019 10:06 AM      Failed - Total Cholesterol in normal range and within 360 days  Cholesterol  Date Value Ref Range Status  10/28/2018 200 (H) <200 mg/dL Final         Failed - HDL in normal range and within 360 days    HDL  Date Value Ref Range Status  10/28/2018 48 (L) > OR = 50 mg/dL Final         Failed - Triglycerides in normal range and within 360 days    Triglycerides  Date Value Ref Range Status  10/28/2018 538 (H) <150 mg/dL Final    Comment:    . If a non-fasting specimen was collected, consider repeat triglyceride testing on a fasting specimen if clinically indicated.  Yates Decamp et al. J. of Clin. Lipidol. [id number]. . . There is increased risk of pancreatitis when the  triglyceride concentration is very high  (> or = 500 mg/dL, especially if > or = 1000 mg/dL).  Yates Decamp et al. J. of Clin. Lipidol. [id number]. [name]          Passed - LDL in normal range and within 360 days    LDL Cholesterol (Calc)  Date Value Ref Range Status   10/28/2018  mg/dL (calc) Final    Comment:    . LDL cholesterol not calculated. Triglyceride levels greater than 400 mg/dL invalidate calculated LDL results. . Reference range: <100 . Desirable range <100 mg/dL for primary prevention;   <70 mg/dL for patients with CHD or diabetic patients  with > or = 2 CHD risk factors. [name] LDL-C is now calculated using the Martin-Hopkins  calculation, which is a validated novel method providing  better accuracy than the Friedewald equation in the  estimation of LDL-C.  Cresenciano Genre et al. Annamaria Helling. [id number]): 2061-2068  (http://education.[facility].com/faq/FAQ164)          Passed - Patient is not pregnant      Passed - Valid encounter within last 12 months    Recent Outpatient Visits          1 month ago [facility] [place], Drue Stager, MD   2 months ago COPD with asthma [facility])   [facility] [name], MD   3 months ago History of syncope   [name]   5 months ago Mass of left axilla   [name], [facility]   8 months ago Other constipation   [facility] [name], MD      Future Appointments            In 5 months Quita Skye Blinda [facility]   In 5 months  [facility]           . metoprolol tartrate (LOPRESSOR) 25 MG tablet [Pharmacy Med Name: METOPROLOL TARTRATE 25 MG TAB] 180 tablet     Sig: TAKE 1 TABLET BY MOUTH TWICE A DAY     Cardiovascular:  Beta Blockers Passed - 04/13/2019 10:06 AM      Passed - Last BP in normal range    BP Readings from Last 1 Encounters:  04/01/19 119/60         Passed - Last Heart Rate in normal range    Pulse Readings from Last 1 Encounters:  04/01/19 (!) 57         Passed - Valid encounter within last 6 months    Recent Outpatient Visits          1 month ago Fibromyalgia   [facility] [name], MD   2 months ago COPD with asthma [facility])   [name], MD   3 months ago History of syncope   [name], MD   5 months ago Mass of left axilla   [facility] [name], [facility]   8 months ago Other constipation   [facility] [name], MD      Future Appointments            In 5 months [facility] Skye Blinda [facility] [place]   In 5 months  [facility] [place]

## 2019-04-13 NOTE — Telephone Encounter (Signed)
Requested medication (s) are due for refill today: yes  Requested medication (s) are on the active medication list: historical medication  Last refill:  03/19/19  Future visit scheduled: no  Notes to clinic:  historical med and provider   Requested Prescriptions  Pending Prescriptions Disp Refills   metoprolol tartrate (LOPRESSOR) 25 MG tablet [Pharmacy Med Name: METOPROLOL TARTRATE 25 MG TAB] 180 tablet     Sig: TAKE 1 TABLET BY MOUTH TWICE A DAY      Cardiovascular:  Beta Blockers Passed - 04/13/2019 10:06 AM      Passed - Last BP in normal range    BP Readings from Last 1 Encounters:  04/01/19 119/60          Passed - Last Heart Rate in normal range    Pulse Readings from Last 1 Encounters:  04/01/19 (!) 57          Passed - Valid encounter within last 6 months    Recent Outpatient Visits           1 month ago [facility] [place], Drue Stager, MD   2 months ago COPD with asthma [facility])   [facility] [name], MD   3 months ago History of syncope   [name], MD   5 months ago Mass of left axilla   [facility] [name], MD   8 months ago Other constipation   [facility] [name], MD       Future Appointments             In 5 months [facility] [place] [facility]   In 5 months  [facility]             Signed Prescriptions Disp Refills   venlafaxine XR (EFFEXOR-XR) 75 MG 24 hr capsule 90 capsule 1    Sig: TAKE 1 CAPSULE (75 MG TOTAL) BY MOUTH DAILY WITH BREAKFAST.      Psychiatry: Antidepressants - SNRI - desvenlafaxine & venlafaxine Failed - 04/13/2019 10:06 AM      Failed - Total Cholesterol in normal range and within 360 days    Cholesterol  Date Value Ref Range Status  10/28/2018 200 (H) <200 mg/dL Final          Failed - Triglycerides in normal range and within  360 days    Triglycerides  Date Value Ref Range Status  10/28/2018 538 (H) <150 mg/dL Final    Comment:    . If a non-fasting specimen was collected, consider repeat triglyceride testing on a fasting specimen if clinically indicated.  Yates Decamp et al. J. of Clin. Lipidol. [id number]. . . There is increased risk of pancreatitis when the  triglyceride concentration is very high  (> or = 500 mg/dL, especially if > or = 1000 mg/dL).  Yates Decamp et al. J. of Clin. Lipidol. [id number]. [name]           Passed - LDL in normal range and within 360 days    LDL Cholesterol (Calc)  Date Value Ref Range Status  10/28/2018  mg/dL (calc) Final    Comment:    . LDL cholesterol not calculated. Triglyceride levels greater than 400 mg/dL invalidate calculated LDL results. . Reference range: <100 . Desirable range <100 mg/dL for primary prevention;   <70 mg/dL for patients with CHD or diabetic patients  with > or = 2 CHD risk  factors. [name] LDL-C is now calculated using the Martin-Hopkins  calculation, which is a validated novel method providing  better accuracy than the Friedewald equation in the  estimation of LDL-C.  Cresenciano Genre et al. Annamaria Helling. [id number]): 2061-2068  (http://education.[facility].com/faq/FAQ164)           Passed - Completed PHQ-2 or PHQ-9 in the last 360 days.      Passed - Last BP in normal range    BP Readings from Last 1 Encounters:  04/01/19 119/60          Passed - Valid encounter within last 6 months    Recent Outpatient Visits           1 month ago [facility] [name], MD   2 months ago COPD with asthma [facility])   [name][facility]   3 months ago History of syncope   [name], [facility]   5 months ago Mass of left axilla   [facility] [name], MD   8 months ago Other constipation   [facility]  [name], MD       Future Appointments             In 5 months [facility] [place] [facility]   In 5 months  [facility]              atorvastatin (LIPITOR) 40 MG tablet 90 tablet 0    Sig: TAKE 1 TABLET BY MOUTH EVERYDAY AT BEDTIME      Cardiovascular:  Antilipid - Statins Failed - 04/13/2019 10:06 AM      Failed - Total Cholesterol in normal range and within 360 days    Cholesterol  Date Value Ref Range Status  10/28/2018 200 (H) <200 mg/dL Final          Failed - HDL in normal range and within 360 days    HDL  Date Value Ref Range Status  10/28/2018 48 (L) > OR = 50 mg/dL Final          Failed - Triglycerides in normal range and within 360 days    Triglycerides  Date Value Ref Range Status  10/28/2018 538 (H) <150 mg/dL Final    Comment:    . If a non-fasting specimen was collected, consider repeat triglyceride testing on a fasting specimen if clinically indicated.  Yates Decamp et al. J. of Clin. Lipidol. [id number]. . . There is increased risk of pancreatitis when the  triglyceride concentration is very high  (> or = 500 mg/dL, especially if > or = 1000 mg/dL).  Yates Decamp et al. J. of Clin. Lipidol. [id number]. [name]           Passed - LDL in normal range and within 360 days    LDL Cholesterol (Calc)  Date Value Ref Range Status  10/28/2018  mg/dL (calc) Final    Comment:    . LDL cholesterol not calculated. Triglyceride levels greater than 400 mg/dL invalidate calculated LDL results. . Reference range: <100 . Desirable range <100 mg/dL for primary prevention;   <70 mg/dL for patients with CHD or diabetic patients  with > or = 2 CHD risk factors. [name] LDL-C is now calculated using the Martin-Hopkins  calculation, which is a validated novel method providing  better accuracy than the Friedewald equation in the  estimation of LDL-C.  Cresenciano Genre et al. Annamaria Helling.  [id number]): 2061-2068   (http://education.[facility].com/faq/FAQ164)           Passed - Patient is not pregnant      Passed - Valid encounter within last 12 months    Recent Outpatient Visits           1 month ago [facility] [name], MD   2 months ago COPD with asthma [facility])   [name], MD   3 months ago History of syncope   [name], [facility]   5 months ago Mass of left axilla   [facility] [name], MD   8 months ago Other constipation   [facility] [name], MD       Future Appointments             In 5 months Quita Skye Blinda [facility] [place]   In 5 months  [facility], [facility] [place]

## 2019-04-16 ENCOUNTER — Other Ambulatory Visit: Payer: Self-pay | Admitting: Family Medicine

## 2019-04-16 ENCOUNTER — Other Ambulatory Visit: Payer: Federal, State, Local not specified - PPO | Admitting: Urology

## 2019-04-16 ENCOUNTER — Other Ambulatory Visit (INDEPENDENT_AMBULATORY_CARE_PROVIDER_SITE_OTHER): Payer: Self-pay | Admitting: Surgical

## 2019-04-16 DIAGNOSIS — B370 Candidal stomatitis: Secondary | ICD-10-CM

## 2019-04-16 NOTE — Telephone Encounter (Signed)
Requested medication (s) are due for refill today: Yes  Requested medication (s) are on the active medication list: Yes  Last refill:  12/04/18  Future visit scheduled: No  Notes to clinic:  See request.    Requested Prescriptions  Pending Prescriptions Disp Refills   nystatin (MYCOSTATIN) 100000 UNIT/ML suspension [Pharmacy Med Name: NYSTATIN 100,000 UNIT/ML SUSP] 480 mL 0    Sig: TAKE 5 MLS (500,000 UNITS TOTAL) BY MOUTH 4 (FOUR) TIMES DAILY.      Off-Protocol Failed - 04/16/2019  3:28 PM      Failed - Medication not assigned to a protocol, review manually.      Passed - Valid encounter within last 12 months    Recent Outpatient Visits           1 month ago [facility] [name], MD   2 months ago COPD with asthma [facility])   [facility] [name], MD   3 months ago History of syncope   [facility] [name], MD   5 months ago Mass of left axilla   [facility] [name], MD   8 months ago Other constipation   [facility] [name], MD       Future Appointments             In 5 months Q[facility]a Skye Blinda [facility] [place]   In 5 months  [facility]

## 2019-04-16 NOTE — Telephone Encounter (Signed)
Last saw GD 01/2019

## 2019-04-16 NOTE — Telephone Encounter (Signed)
Please advise 

## 2019-04-21 ENCOUNTER — Encounter: Payer: Self-pay | Admitting: Ophthalmology

## 2019-04-24 ENCOUNTER — Other Ambulatory Visit: Payer: Medicare Other | Admitting: Urology

## 2019-04-25 ENCOUNTER — Telehealth: Payer: Medicare Other | Admitting: Family Medicine

## 2019-04-25 ENCOUNTER — Other Ambulatory Visit
Admission: RE | Admit: 2019-04-25 | Discharge: 2019-04-25 | Disposition: A | Discharge status: Home / Self Care | Payer: Medicare Other | Source: Ambulatory Visit | Attending: Ophthalmology | Admitting: Ophthalmology

## 2019-04-25 DIAGNOSIS — Z01812 Encounter for preprocedural laboratory examination: Secondary | ICD-10-CM | POA: Insufficient documentation

## 2019-04-25 DIAGNOSIS — Z20822 Contact with and (suspected) exposure to covid-19: Secondary | ICD-10-CM | POA: Insufficient documentation

## 2019-04-25 LAB — SARS CORONAVIRUS 2 (TAT 6-24 HRS): SARS Coronavirus 2: NEGATIVE

## 2019-04-25 NOTE — Discharge Instructions (Signed)

## 2019-04-26 ENCOUNTER — Other Ambulatory Visit: Payer: Self-pay | Admitting: Family Medicine

## 2019-04-26 DIAGNOSIS — J302 Other seasonal allergic rhinitis: Secondary | ICD-10-CM

## 2019-04-28 ENCOUNTER — Telehealth: Payer: Self-pay | Admitting: Urology

## 2019-04-28 NOTE — Telephone Encounter (Signed)
Please call Jacqueline Lucas and have her reschedule her appointment for a CT urogram report and cystoscopy for gross hematuria.

## 2019-04-28 NOTE — Telephone Encounter (Signed)
Patient is away from her calendar but will call back to reschedule.

## 2019-04-29 ENCOUNTER — Encounter: Payer: Self-pay | Admitting: Ophthalmology

## 2019-04-29 ENCOUNTER — Ambulatory Visit: Payer: Medicare Other | Admitting: Anesthesiology

## 2019-04-29 ENCOUNTER — Ambulatory Visit
Admission: RE | Admit: 2019-04-29 | Discharge: 2019-04-29 | Disposition: A | Discharge status: Home / Self Care | Payer: Medicare Other | Attending: Ophthalmology | Admitting: Ophthalmology

## 2019-04-29 ENCOUNTER — Encounter
Admission: RE | Disposition: A | Discharge status: Home / Self Care | Payer: Self-pay | Source: Home / Self Care | Attending: Ophthalmology

## 2019-04-29 ENCOUNTER — Other Ambulatory Visit: Payer: Self-pay

## 2019-04-29 DIAGNOSIS — H2512 Age-related nuclear cataract, left eye: Secondary | ICD-10-CM | POA: Insufficient documentation

## 2019-04-29 DIAGNOSIS — Z79899 Other long term (current) drug therapy: Secondary | ICD-10-CM | POA: Insufficient documentation

## 2019-04-29 DIAGNOSIS — Z6834 Body mass index (BMI) 34.0-34.9, adult: Secondary | ICD-10-CM | POA: Diagnosis not present

## 2019-04-29 DIAGNOSIS — F419 Anxiety disorder, unspecified: Secondary | ICD-10-CM | POA: Insufficient documentation

## 2019-04-29 DIAGNOSIS — F319 Bipolar disorder, unspecified: Secondary | ICD-10-CM | POA: Diagnosis not present

## 2019-04-29 DIAGNOSIS — Z7982 Long term (current) use of aspirin: Secondary | ICD-10-CM | POA: Insufficient documentation

## 2019-04-29 DIAGNOSIS — Z96653 Presence of artificial knee joint, bilateral: Secondary | ICD-10-CM | POA: Diagnosis not present

## 2019-04-29 DIAGNOSIS — Z96643 Presence of artificial hip joint, bilateral: Secondary | ICD-10-CM | POA: Insufficient documentation

## 2019-04-29 DIAGNOSIS — J449 Chronic obstructive pulmonary disease, unspecified: Secondary | ICD-10-CM | POA: Insufficient documentation

## 2019-04-29 DIAGNOSIS — Z7951 Long term (current) use of inhaled steroids: Secondary | ICD-10-CM | POA: Insufficient documentation

## 2019-04-29 DIAGNOSIS — H25812 Combined forms of age-related cataract, left eye: Secondary | ICD-10-CM | POA: Diagnosis not present

## 2019-04-29 DIAGNOSIS — K219 Gastro-esophageal reflux disease without esophagitis: Secondary | ICD-10-CM | POA: Diagnosis not present

## 2019-04-29 DIAGNOSIS — I10 Essential (primary) hypertension: Secondary | ICD-10-CM | POA: Insufficient documentation

## 2019-04-29 DIAGNOSIS — E7800 Pure hypercholesterolemia, unspecified: Secondary | ICD-10-CM | POA: Diagnosis not present

## 2019-04-29 DIAGNOSIS — E039 Hypothyroidism, unspecified: Secondary | ICD-10-CM | POA: Insufficient documentation

## 2019-04-29 DIAGNOSIS — Z79890 Hormone replacement therapy (postmenopausal): Secondary | ICD-10-CM | POA: Diagnosis not present

## 2019-04-29 HISTORY — PX: CATARACT EXTRACTION W/PHACO: SHX586

## 2019-04-29 SURGERY — PHACOEMULSIFICATION, CATARACT, WITH IOL INSERTION
Anesthesia: Monitor Anesthesia Care | Site: Eye | Laterality: Left

## 2019-04-29 MED ORDER — TETRACAINE HCL 0.5 % OP SOLN
1.0000 [drp] | OPHTHALMIC | Status: DC | PRN
  Administered 2019-04-29 (×3): 1 [drp] via OPHTHALMIC

## 2019-04-29 MED ORDER — BRIMONIDINE TARTRATE-TIMOLOL 0.2-0.5 % OP SOLN
OPHTHALMIC | Status: DC | PRN
  Administered 2019-04-29: 1 [drp] via OPHTHALMIC

## 2019-04-29 MED ORDER — ONDANSETRON HCL 4 MG/2ML IJ SOLN
INTRAMUSCULAR | Status: DC | PRN
  Administered 2019-04-29: 4 mg via INTRAVENOUS

## 2019-04-29 MED ORDER — NA CHONDROIT SULF-NA HYALURON 40-17 MG/ML IO SOLN
INTRAOCULAR | Status: DC | PRN
  Administered 2019-04-29: 1 mL via INTRAOCULAR

## 2019-04-29 MED ORDER — LACTATED RINGERS IV SOLN: INTRAVENOUS | Status: DC

## 2019-04-29 MED ORDER — EPINEPHRINE PF 1 MG/ML IJ SOLN
INTRAOCULAR | Status: DC | PRN
  Administered 2019-04-29: 40 mL via OPHTHALMIC

## 2019-04-29 MED ORDER — FENTANYL CITRATE (PF) 100 MCG/2ML IJ SOLN
INTRAMUSCULAR | Status: DC | PRN
  Administered 2019-04-29: 100 ug via INTRAVENOUS

## 2019-04-29 MED ORDER — MIDAZOLAM HCL 2 MG/2ML IJ SOLN
INTRAMUSCULAR | Status: DC | PRN
  Administered 2019-04-29: 2 mg via INTRAVENOUS

## 2019-04-29 MED ORDER — ARMC OPHTHALMIC DILATING DROPS
1.0000 "application " | OPHTHALMIC | Status: DC | PRN
  Administered 2019-04-29 (×3): 1 via OPHTHALMIC

## 2019-04-29 MED ORDER — LIDOCAINE HCL (PF) 2 % IJ SOLN: INTRAOCULAR | Status: DC | PRN

## 2019-04-29 MED ORDER — MOXIFLOXACIN HCL 0.5 % OP SOLN
OPHTHALMIC | Status: DC | PRN
  Administered 2019-04-29: 0.2 mL via OPHTHALMIC

## 2019-04-29 SURGICAL SUPPLY — 22 items
CANNULA ANT/CHMB 27G (MISCELLANEOUS) ×2 IMPLANT
CANNULA ANT/CHMB 27GA (MISCELLANEOUS) ×6 IMPLANT
GLOVE SURG LX 8.0 MICRO (GLOVE) ×2
GLOVE SURG LX STRL 8.0 MICRO (GLOVE) ×1 IMPLANT
GLOVE SURG TRIUMPH 8.0 PF LTX (GLOVE) ×3 IMPLANT
GOWN STRL REUS W/ TWL LRG LVL3 (GOWN DISPOSABLE) ×2 IMPLANT
GOWN STRL REUS W/TWL LRG LVL3 (GOWN DISPOSABLE) ×6
LENS IOL ACRSF IQ TRC 7 16.0 IMPLANT
LENS IOL ACRYSOF IQ TORIC 16.0 ×3 IMPLANT
LENS IOL IQ TORIC 7 16.0 ×1 IMPLANT
MARKER SKIN DUAL TIP RULER LAB (MISCELLANEOUS) ×3 IMPLANT
NDL FILTER BLUNT 18X1 1/2 (NEEDLE) ×1 IMPLANT
NDL RETROBULBAR .5 NSTRL (NEEDLE) ×3 IMPLANT
NEEDLE FILTER BLUNT 18X 1/2SAF (NEEDLE) ×2
NEEDLE FILTER BLUNT 18X1 1/2 (NEEDLE) ×1 IMPLANT
PACK EYE AFTER SURG (MISCELLANEOUS) ×3 IMPLANT
PACK OPTHALMIC (MISCELLANEOUS) ×3 IMPLANT
PACK PORFILIO (MISCELLANEOUS) ×3 IMPLANT
SYR 3ML LL SCALE MARK (SYRINGE) ×3 IMPLANT
SYR TB 1ML LUER SLIP (SYRINGE) ×3 IMPLANT
WATER STERILE IRR 250ML POUR (IV SOLUTION) ×3 IMPLANT
WIPE NON LINTING 3.25X3.25 (MISCELLANEOUS) ×3 IMPLANT

## 2019-04-29 NOTE — Op Note (Signed)
PREOPERATIVE DIAGNOSIS:  Nuclear sclerotic cataract of the left eye.   POSTOPERATIVE DIAGNOSIS:  Nuclear sclerotic cataract of the left eye.   OPERATIVE PROCEDURE: Procedure(s): CATARACT EXTRACTION PHACO AND INTRAOCULAR LENS PLACEMENT (IOC)  LEFT TORIC LENS 4.52  00:29.1   SURGEON:  Birder Robson, MD.   ANESTHESIA: 1.      Managed anesthesia care. 2.     0.45ml os Shugarcaine was instilled following the paracentesis 2oranesstaff@   COMPLICATIONS:  None.   TECHNIQUE:   Stop and chop    DESCRIPTION OF PROCEDURE:  The patient was examined and consented in the preoperative holding area where the aforementioned topical anesthesia was applied to the left eye.  The patient was brought back to the Operating Room where he was sat upright on the gurney and given a target to fixate upon while the eye was marked at the 3:00 and 9:00 position.  The patient was then reclined on the operating table.  The eye was prepped and draped in the usual sterile ophthalmic fashion and a lid speculum was placed. A paracentesis was created with the side port blade and the anterior chamber was filled with viscoelastic. A near clear corneal incision was performed with the steel keratome. A continuous curvilinear capsulorrhexis was performed with a cystotome followed by the capsulorrhexis forceps. Hydrodissection and hydrodelineation were carried out with BSS on a blunt cannula. The lens was removed in a stop and chop technique and the remaining cortical material was removed with the irrigation-aspiration handpiece. The eye was inflated with viscoelastic and the SNAT7 lens was placed in the eye and rotated to within a few degrees of the predetermined orientation.  The remaining viscoelastic was removed from the eye.  The Sinskey hook was used to rotate the toric lens into its final resting place at 067 degrees.  0.1 ml of Vigamox was placed in the anterior chamber. The eye was inflated to a physiologic pressure and found to be  watertight.  The eye was dressed with Vigamox. The patient was given protective glasses to wear throughout the day and a shield with which to sleep tonight. The patient was also given drops with which to begin a drop regimen today and will follow-up with me in one day. Implant Name Type Inv. Item Serial No. Manufacturer Lot No. LRB No. Used Action  ACRYSOF IQ TORIC IOL Intraocular Lens  [id number]   Left 1 Implanted   Procedure(s): CATARACT EXTRACTION PHACO AND INTRAOCULAR LENS PLACEMENT (IOC)  LEFT TORIC LENS 4.52  00:29.1 (Left)  Electronically signed: Birder Robson 3/2/20218:52 AM

## 2019-04-29 NOTE — Anesthesia Postprocedure Evaluation (Signed)
Anesthesia Post Note  Patient: Jacqueline Lucas  Procedure(s) Performed: CATARACT EXTRACTION PHACO AND INTRAOCULAR LENS PLACEMENT (IOC)  LEFT TORIC LENS 4.52  00:29.1 (Left Eye)     Patient location during evaluation: PACU Anesthesia Type: MAC Level of consciousness: awake Pain management: pain level controlled Vital Signs Assessment: post-procedure vital signs reviewed and stable Respiratory status: respiratory function stable Cardiovascular status: stable Postop Assessment: no apparent nausea or vomiting Anesthetic complications: no    Veda Canning

## 2019-04-29 NOTE — Transfer of Care (Signed)
Immediate Anesthesia Transfer of Care Note  Patient: Jacqueline Lucas  Procedure(s) Performed: CATARACT EXTRACTION PHACO AND INTRAOCULAR LENS PLACEMENT (IOC)  LEFT TORIC LENS 4.52  00:29.1 (Left Eye)  Patient Location: PACU  Anesthesia Type: MAC  Level of Consciousness: awake, alert  and patient cooperative  Airway and Oxygen Therapy: Patient Spontanous Breathing and Patient connected to supplemental oxygen  Post-op Assessment: Post-op Vital signs reviewed, Patient's Cardiovascular Status Stable, Respiratory Function Stable, Patent Airway and No signs of Nausea or vomiting  Post-op Vital Signs: Reviewed and stable  Complications: No apparent anesthesia complications

## 2019-04-29 NOTE — Anesthesia Preprocedure Evaluation (Signed)
Anesthesia Evaluation  Patient identified by MRN, date of birth, ID band Patient awake    Reviewed: NPO status   History of Anesthesia Complications Negative for: history of anesthetic complications  Airway Mallampati: II  TM Distance: >3 FB Neck ROM: full    Dental  (+) Partial Lower   Pulmonary asthma (mild) ,    breath sounds clear to auscultation       Cardiovascular Exercise Tolerance: Good hypertension, + dysrhythmias (ablation for svt ?) Supra Ventricular Tachycardia  Rhythm:Regular Rate:Normal     Neuro/Psych  Headaches (on oxycodone), PSYCHIATRIC DISORDERS Anxiety Depression Bipolar Disorder fibromyalgia    GI/Hepatic GERD  Controlled,ibs   Endo/Other  Hypothyroidism Morbid obesity (bmi 35)  Renal/GU   negative genitourinary   Musculoskeletal  (+) Arthritis , Fibromyalgia -  Abdominal   Peds  Hematology   Anesthesia Other Findings Covid: NEG.   Jobe Gibbon, MD at 11/13/2018  : cards.  Reproductive/Obstetrics                             Anesthesia Physical  Anesthesia Plan  ASA: III  Anesthesia Plan: MAC   Post-op Pain Management:    Induction:   PONV Risk Score and Plan: 2 and Midazolam, TIVA and Treatment may vary due to age or medical condition  Airway Management Planned:   Additional Equipment:   Intra-op Plan:   Post-operative Plan:   Informed Consent: I have reviewed the patients History and Physical, chart, labs and discussed the procedure including the risks, benefits and alternatives for the proposed anesthesia with the patient or authorized representative who has indicated his/her understanding and acceptance.       Plan Discussed with: CRNA  Anesthesia Plan Comments:         Anesthesia Quick Evaluation

## 2019-04-29 NOTE — H&P (Signed)
All labs reviewed. Abnormal studies sent to patients PCP when indicated.  Previous H&P reviewed, patient examined, there are NO CHANGES.  Celine Dishman Porfilio3/2/20218:20 AM

## 2019-05-05 ENCOUNTER — Ambulatory Visit (INDEPENDENT_AMBULATORY_CARE_PROVIDER_SITE_OTHER): Payer: Medicare Other | Admitting: Family Medicine

## 2019-05-05 ENCOUNTER — Other Ambulatory Visit: Payer: Self-pay

## 2019-05-05 ENCOUNTER — Encounter: Payer: Self-pay | Admitting: Family Medicine

## 2019-05-05 VITALS — BP 124/76 | HR 96 | Temp 97.1°F | Resp 16 | Ht 64.0 in | Wt 202.5 lb

## 2019-05-05 DIAGNOSIS — R5383 Other fatigue: Secondary | ICD-10-CM

## 2019-05-05 DIAGNOSIS — I10 Essential (primary) hypertension: Secondary | ICD-10-CM

## 2019-05-05 DIAGNOSIS — E785 Hyperlipidemia, unspecified: Secondary | ICD-10-CM | POA: Diagnosis not present

## 2019-05-05 DIAGNOSIS — F330 Major depressive disorder, recurrent, mild: Secondary | ICD-10-CM | POA: Diagnosis not present

## 2019-05-05 DIAGNOSIS — E039 Hypothyroidism, unspecified: Secondary | ICD-10-CM | POA: Diagnosis not present

## 2019-05-05 DIAGNOSIS — R739 Hyperglycemia, unspecified: Secondary | ICD-10-CM | POA: Diagnosis not present

## 2019-05-05 DIAGNOSIS — J449 Chronic obstructive pulmonary disease, unspecified: Secondary | ICD-10-CM

## 2019-05-05 DIAGNOSIS — E559 Vitamin D deficiency, unspecified: Secondary | ICD-10-CM

## 2019-05-05 DIAGNOSIS — D509 Iron deficiency anemia, unspecified: Secondary | ICD-10-CM | POA: Diagnosis not present

## 2019-05-05 DIAGNOSIS — M797 Fibromyalgia: Secondary | ICD-10-CM

## 2019-05-05 DIAGNOSIS — R7303 Prediabetes: Secondary | ICD-10-CM

## 2019-05-05 DIAGNOSIS — F3160 Bipolar disorder, current episode mixed, unspecified: Secondary | ICD-10-CM

## 2019-05-05 MED ORDER — OLMESARTAN MEDOXOMIL-HCTZ 40-12.5 MG PO TABS: 0.5000 | ORAL_TABLET | Freq: Every day | ORAL | 1 refills | Status: AC

## 2019-05-05 MED ORDER — ATORVASTATIN CALCIUM 40 MG PO TABS: 40.0000 mg | ORAL_TABLET | Freq: Every day | ORAL | 1 refills | Status: DC

## 2019-05-05 NOTE — Progress Notes (Signed)
Name: Jacqueline Lucas   MRN: [id number]    DOB: Dec 09, 1950   Date:05/05/2019       Progress Note  Subjective  Chief Complaint  Chief Complaint  Patient presents with  . Medication Refill  . Hypertension  . Asthma  . Manic Behavior  . Constipation  . FMS    HPI  HTN/SVTshe is takingBenicar - 40-12.5, mteoprolol,and is doing well,no chest pain or palpitation. BP today is at goal.   Bipolar disorder: she is doing well on Depakote and Effexor, denies mania,but she statescontinues to have episodes of not sleeping for two or three days in a row, we will try addingSeroquel, that is chronicShe states she still taking hydroxyzine prn and seems to help with anxiety. Her children are very supportive, she is currently helping her 71 yo and sometimes her 69 yo grand-daughter   - watching her during the day and helping with school work, also helps to assist her daughter ( paramedic ) and keeps her 3 children at night sometimes. She is good during the day when the kids are there, but still misses her husband at night, and looks for him when sleeping at night, affecting her sleep.   Chronic back pain: she has seen surgeon in [place], she is noticed more pain on her spine, she trying to stretch and it helps some, she will try to go back to see him soon   FMS: seeing Dr. Olegario Messier on muscle relaxer and gabapentin.She has noticed increase in pain since she started to take care of two grand-daughters ( she used to just watch the 3 yo ) but now taking care of the 70 yo and 70 yo sometimes at the same time.   Dyslipidemia: currently on lipitor only, off Tricorand is now Lovaza, no side effects.Last level showed worsening of triglycerides, she is not fasting today and explained labs will not be as accurate. She has been taking medications are recommended now   Goiter: she was seen by Dr. Ezequiel Ganser the past, but switched to Dr. Gabriel Carina and was given reassurance, no need for biopsy at this time,  she states Dr. Gabriel Carina monitors her level but is due for recheck and since increase in fatigue we will recheck it today   COPD/Asthma:from second hand smoking, taking Duleraand singulair.Shestates she has occasional wheezing but recently having more SOB, usually when taking care of two of her grand-children. Explained she may be out of shape, needs to stop and rest when she starts to get SOB   Angina/ History of SVT: doing well, on metoprolol, statin, and aspirin, sees Dr. Clayborn Bigness yearlybasis now. She has noticed some palpitation after she uses inhaler but does not last long and no chest pain   Obesity & Hyperglycemia: shehas hyperglycemia./insuilin resistance,She had  some metformin at home but caused diarrhea and she stopped it.Last A1C done 09/2018 was down to 6.2%, she is drinking sodas occasionally - just a little sip now, mostly drinking un-sweet tea and water now   Constipation: seen by GI back in 2017 with same symptoms, colonoscopy was normal, and advised to take Miralax, she is now on Linzess daily and is doing well, no blood in stools or straining lately , responding well to medication.   Pulsatile Tinnitus: seen by Dr. Tami Ribas , she had an US carotid done 01/01/2019 moderate calcified plaques , negative intracranial MRA, she states beeping sensation is stable. She states symptoms resolved for a period of time but is back intermittently now, not as severe.  Bilateral  pain: going on for the past 12  months , she states worse with activity such as walking, pain is described as aching on upper thigh and right groin, she is seeing Dr. Jefferson Fuel.   Iron deficiency anemia: up to date with colonoscopy, no dyspepsia or indigestion, no blood in stools, she has not brought hemoccult cards yet   Patient Active Problem List   Diagnosis Date Noted  . Superior glenoid labrum lesion of left shoulder   . Lumbar stenosis 02/04/2018  . Hx of hysterectomy 08/13/2017  . Pain in left  wrist 06/12/2017  . Chronic venous insufficiency 06/09/2017  . Varicose veins of both lower extremities with pain 04/30/2017  . Claudication ([place]) 04/30/2017  . Arthritis of carpometacarpal [facility]) joint of left thumb 05/12/2016  . Bilateral thumb pain 05/01/2016  . Anemia 02/03/2016  . Status post bilateral total hip replacement 01/10/2016  . Sicca ([place]) 01/10/2016  . Age related osteoporosis 01/10/2016  . Hip arthritis 08/16/2015  . Microscopic hematuria 07/30/2015  . Vaginal atrophy 07/30/2015  . GAD (generalized anxiety disorder) 07/08/2015  . Moderate episode of recurrent major depressive disorder ([place]) 07/08/2015  . Migraine with aura and without status migrainosus, not intractable 07/08/2015  . Dyslipidemia 07/08/2015  . Degenerative arthritis of right knee 03/16/2015  . Atrophic vaginitis 01/31/2015  . Recurrent UTI 12/31/2014  . IBS (irritable bowel syndrome) 10/29/2014  . Asthma, moderate persistent 09/08/2014  . Allergic state 09/07/2014  . Colon polyp 09/07/2014  . Hemorrhoid 09/07/2014  . Constrictive tenosynovitis 03/12/2014  . H/O total knee replacement, bilateral 07/31/2013  . CAD (coronary artery disease) 11/28/2012  . Anxiety 11/28/2012  . Acid reflux 11/28/2012  . Cardiac murmur 11/28/2012  . BP (high blood pressure) 11/28/2012  . Cannot sleep 11/28/2012  . Adiposity 11/28/2012  . Restless leg 11/28/2012  . Supraventricular tachycardia ([place]) 11/28/2012  . Fibromyalgia 11/28/2012  . Tachycardia 11/28/2012    Past Surgical History:  Procedure Laterality Date  . ABDOMINAL HYSTERECTOMY     due to cancer of ovaries  . BACK SURGERY N/A 2014   x 5  . BACK SURGERY  12/2017  . CARDIAC CATHETERIZATION     no PCI; 12/18/12 ([facility], Dr. Marcello Moores): EP study with ablation. No coronary angiography done then.  [name] CATARACT EXTRACTION W/PHACO Right 04/01/2019   Procedure: CATARACT EXTRACTION PHACO AND INTRAOCULAR LENS PLACEMENT (IOC) RIGHT  TORIC 6.19 00:40.1;  Surgeon:  Birder Robson, MD;  Location: [place];  Service: Ophthalmology;  Laterality: Right;  . CATARACT EXTRACTION W/PHACO Left 04/29/2019   Procedure: CATARACT EXTRACTION PHACO AND INTRAOCULAR LENS PLACEMENT (IOC)  LEFT TORIC LENS 4.52  00:29.1;  Surgeon: Birder Robson, MD;  Location: [place];  Service: Ophthalmology;  Laterality: Left;  . COLONOSCOPY W/ POLYPECTOMY    . COLONOSCOPY WITH PROPOFOL N/A 02/04/2016   Procedure: COLONOSCOPY WITH PROPOFOL;  Surgeon: Robert Bellow, MD;  [facility];  Service: Endoscopy;  Laterality: N/A;  . ESOPHAGOGASTRODUODENOSCOPY (EGD) WITH PROPOFOL N/A 02/04/2016   Procedure: ESOPHAGOGASTRODUODENOSCOPY (EGD) WITH PROPOFOL;  Surgeon: Robert Bellow, MD;  Location: [facility] ENDOSCOPY;  Service: Endoscopy;  Laterality: N/A;  . FINGER ARTHROSCOPY WITH CARPOMETACARPEL ([address]) ARTHROPLASTY Left 05/01/2016   Dr. Erlinda Hong  . GIVENS CAPSULE STUDY N/A 05/02/2016   Procedure: GIVENS CAPSULE STUDY;  Surgeon: Manya Silvas, MD;  Location: [facility] ENDOSCOPY;  Service: Endoscopy;  Laterality: N/A;  . HAND SURGERY    . HEMORRHOID SURGERY    . HIP ARTHROPLASTY    .  JOINT REPLACEMENT Bilateral    knee  . KNEE ARTHROPLASTY    . REPLACEMENT TOTAL KNEE Left   . SHOULDER ARTHROSCOPY Left 09/09/2018   Procedure: LEFT SHOULDER ARTHROSCOPY, BICEPS TENOTOMY;  Surgeon: Meredith Pel, MD;  Location: [place];  Service: Orthopedics;  Laterality: Left;  . SPINE SURGERY     x 2  . tendonititis     right elbow, right wrist  . TOTAL HIP ARTHROPLASTY Left 2013  . TOTAL HIP ARTHROPLASTY Right 08/16/2015   Procedure: TOTAL HIP ARTHROPLASTY ANTERIOR APPROACH;  Surgeon: Meredith Pel, MD;  Location: [place];  Service: Orthopedics;  Laterality: Right;  . TOTAL KNEE ARTHROPLASTY Right 03/16/2015   Procedure: RIGHT TOTAL KNEE ARTHROPLASTY, PCL SACRIFICING;  Surgeon: Meredith Pel, MD;  Location: [place];  Service: Orthopedics;  Laterality:  Right;    Family History  Problem Relation Age of Onset  . Diabetes Mother   . Hypertension Mother   . Heart disease Mother   . Alzheimer's disease Mother   . Diabetes Father   . Throat cancer Father   . Hypertension Sister   . Depression Sister   . Fibromyalgia Sister   . Cancer Brother   . Heart disease Brother   . Stroke Son   . Fibromyalgia Daughter   . Bladder Cancer Neg Hx   . Kidney disease Neg Hx   . Kidney cancer Neg Hx     Social History   Tobacco Use  . Smoking status: Never Smoker  . Smokeless tobacco: Never Used  . Tobacco comment: smoking cessation materials not required  Substance Use Topics  . Alcohol use: No    Alcohol/week: 0.0 standard drinks     Current Outpatient Medications:  .  aspirin 81 MG chewable tablet, Chew 81 mg by mouth daily., Disp: , Rfl:  .  atorvastatin (LIPITOR) 40 MG tablet, TAKE 1 TABLET BY MOUTH EVERYDAY AT BEDTIME, Disp: 90 tablet, Rfl: 0 .  baclofen (LIORESAL) 10 MG tablet, TAKE ONE TABLET AT 7AM AND ONE TABLET AT 2PM AS NEEDED FOR MUSCLE SPASM, Disp: 180 tablet, Rfl: 1 .  Cholecalciferol (D3-1000) 1000 units tablet, Take 1,000 Units by mouth at bedtime. , Disp: , Rfl:  .  diclofenac sodium (VOLTAREN) 1 % GEL, Apply 2 g topically 4 (four) times daily., Disp: 150 g, Rfl: 2 .  divalproex (DEPAKOTE) 250 MG DR tablet, TAKE 1 TABLET TWICE A DAY, Disp: 180 tablet, Rfl: 1 .  DULERA 200-5 MCG/ACT AERO, USE 2 INHALATIONS ORALLY   TWICE DAILY, Disp: 39 g, Rfl: 0 .  estradiol (ESTRACE) 0.1 MG/GM vaginal cream, APPLY A PEA SIZED AMOUNT JUST INSIDE THE VAGINAL INTROITUS WITH A FINGER TIP EVERY NIGHT FOR 2 WEEKS THEN MONDAY, WEDNESDAY, AND FRIDAY NIGHTS THEREAFTER, Disp: 42.5 g, Rfl: 3 .  gabapentin (NEURONTIN) 300 MG capsule, TAKE 2 CAPSULE TID, Disp: 540 capsule, Rfl: 0 .  hydrOXYzine (VISTARIL) 25 MG capsule, TAKE 1 CAPSULE 3 TIMES A   DAY AS NEEDED FOR ANXIETY, Disp: 90 capsule, Rfl: 0 .  levalbuterol (XOPENEX) 1.25 MG/3ML nebulizer  solution, Take 1.25 mg by nebulization every 4 (four) hours as needed for wheezing., Disp: 72 mL, Rfl: 0 .  levothyroxine (SYNTHROID, LEVOTHROID) 50 MCG tablet, Take 50 mcg by mouth daily before breakfast., Disp: , Rfl:  .  LINZESS 145 MCG CAPS capsule, TAKE 1 CAPSULE DAILY BEFOREBREAKFAST, Disp: 90 capsule, Rfl: 1 .  MAGNESIUM OXIDE PO, Take 400 mg by mouth at bedtime. Reported on 07/28/2015,  Disp: , Rfl:  .  methocarbamol (ROBAXIN) 500 MG tablet, TAKE 1 TABLET BY MOUTH EVERY 8 HOURS AS NEEDED, Disp: 30 tablet, Rfl: 0 .  metoprolol tartrate (LOPRESSOR) 25 MG tablet, TAKE 1 TABLET BY MOUTH TWICE A DAY, Disp: 180 tablet, Rfl: 0 .  montelukast (SINGULAIR) 10 MG tablet, TAKE 1 TABLET AT BEDTIME, Disp: 90 tablet, Rfl: 1 .  nystatin (MYCOSTATIN) 100000 UNIT/ML suspension, TAKE 5 MLS (500,000 UNITS TOTAL) BY MOUTH 4 (FOUR) TIMES DAILY., Disp: 480 mL, Rfl: 0 .  olmesartan-hydrochlorothiazide (BENICAR HCT) 40-12.5 MG tablet, Take 0.5 tablets by mouth daily., Disp: 90 tablet, Rfl: 1 .  omega-3 acid ethyl esters (LOVAZA) 1 g capsule, Take 2 capsules (2 g total) by mouth 2 (two) times daily., Disp: 360 capsule, Rfl: 1 .  ondansetron (ZOFRAN) 4 MG tablet, Take 1 tablet (4 mg total) by mouth every 8 (eight) hours as needed for nausea or vomiting., Disp: 20 tablet, Rfl: 1 .  oxyCODONE (OXY IR/ROXICODONE) 5 MG immediate release tablet, Take 5 mg by mouth every 8 (eight) hours as needed., Disp: , Rfl:  .  polyethylene glycol (MIRALAX / GLYCOLAX) packet, Take 17 g by mouth daily as needed., Disp: , Rfl:  .  RESTASIS 0.05 % ophthalmic emulsion, Place 1 drop into both eyes 2 (two) times daily. , Disp: , Rfl:  .  valACYclovir (VALTREX) 1000 MG tablet, Take 1 tablet (1,000 mg total) by mouth 2 (two) times daily., Disp: 20 tablet, Rfl: 2 .  venlafaxine XR (EFFEXOR-XR) 75 MG 24 hr capsule, TAKE 1 CAPSULE (75 MG TOTAL) BY MOUTH DAILY WITH BREAKFAST., Disp: 90 capsule, Rfl: 1 .  vitamin B-12 (CYANOCOBALAMIN) 1000 MCG tablet,  Take 1,000 mcg by mouth at bedtime., Disp: , Rfl:  .  lansoprazole (PREVACID) 30 MG capsule, Take 1 capsule (30 mg total) daily by mouth. (Patient taking differently: Take 30 mg by mouth 2 (two) times daily before a meal. ), Disp: 90 capsule, Rfl: 1  Allergies  Allergen Reactions  . Morphine Nausea And Vomiting    Ok with nausea medication  . Lyrica [Pregabalin] Other (See Comments)    Joint swelling  . Metformin And Related     diarrhea  . Diazepam Nausea And Vomiting    Can take with anti-nausea medication  . Latex Rash    Bandaid glue  . Rash Away  [Petrolatum-Zinc Oxide] Rash and Swelling  . Salicylates Other (See Comments)    Other reaction(s): Headache  . Tape Rash    Please use paper tape    I personally reviewed active problem list, medication list, allergies, family history, social history, health maintenance with the patient/caregiver today.   ROS  Constitutional: Negative for fever or weight change.  Respiratory: Negative for cough and shortness of breath.   Cardiovascular: Negative for chest pain or palpitations.  Gastrointestinal: Negative for abdominal pain, no bowel changes.  Musculoskeletal:Positive for gait problem or joint swelling.  Skin: Negative for rash.  Neurological: Negative for dizziness or headache.  No other specific complaints in a complete review of systems (except as listed in HPI above).   Objective  Vitals:   05/05/19 1120  BP: 124/76  Pulse: 96  Resp: 16  Temp: (!) 97.1 F (36.2 C)  TempSrc: Temporal  SpO2: 96%  Weight: 202 lb 8 oz (91.9 kg)  Height: 5\' 4"  (1.626 m)    Body mass index is 34.76 kg/m.  Physical Exam  Constitutional: Patient appears well-developed and well-nourished. Obese No distress.  HEENT:  head atraumatic, normocephalic, pupils equal and reactive to light Cardiovascular: Normal rate, regular rhythm and normal heart sounds.  No murmur heard. No BLE edema. Pulmonary/Chest: Effort normal and breath sounds  normal. No respiratory distress. Abdominal: Soft.  There is no tenderness. Muscular Skeletal: walks slowly because of back and hip pain  Psychiatric: Patient has a normal mood and affect. behavior is normal. Judgment and thought content normal.  Recent Results (from the past 2160 hour(s))  Urinalysis, Complete     Status: None   Collection Time: 03/13/19  3:22 PM  Result Value Ref Range   Specific Gravity, UA 1.020 1.005 - 1.030   pH, UA 7.5 5.0 - 7.5   Color, UA Yellow Yellow   Appearance Ur Clear Clear   Leukocytes,UA Negative Negative   Protein,UA Negative Negative/Trace   Glucose, UA Negative Negative   Ketones, UA Negative Negative   RBC, UA Negative Negative   Bilirubin, UA Negative Negative   Urobilinogen, Ur 0.2 0.2 - 1.0 mg/dL   Nitrite, UA Negative Negative   Microscopic Examination See below:   Microscopic Examination     Status: None   Collection Time: 03/13/19  3:22 PM   URINE  Result Value Ref Range   WBC, UA None seen 0 - 5 /hpf   RBC None seen 0 - 2 /hpf   Epithelial Cells (non renal) 0-10 0 - 10 /hpf   Bacteria, UA None seen None seen/Few  I-STAT creatinine     Status: None   Collection Time: 03/26/19  1:26 PM  Result Value Ref Range   Creatinine, Ser 0.80 0.44 - 1.00 mg/dL  SARS CORONAVIRUS 2 (TAT 6-24 HRS) Nasopharyngeal Nasopharyngeal Swab     Status: None   Collection Time: 03/28/19 11:56 AM   Specimen: Nasopharyngeal Swab  Result Value Ref Range   SARS Coronavirus 2 NEGATIVE NEGATIVE    Comment: (NOTE) SARS-CoV-2 target nucleic acids are NOT DETECTED. The SARS-CoV-2 RNA is generally detectable in upper and lower respiratory specimens during the acute phase of infection. Negative results do not preclude SARS-CoV-2 infection, do not rule out co-infections with other pathogens, and should not be used as the sole basis for treatment or other patient management decisions. Negative results must be combined with clinical observations, patient history, and  epidemiological information. The expected result is Negative. Fact Sheet for Patients: SugarRoll.be Fact Sheet for Healthcare Providers: https://www.woods-mathews.com/ This test is not yet approved or cleared by the [place] FDA and  has been authorized for detection and/or diagnosis of SARS-CoV-2 by FDA under an Emergency Use Authorization (EUA). This EUA will remain  in effect (meaning this test can be used) for the duration of the COVID-19 declaration under Section 56 4(b)(1) of the Act, 21 U.S.C. section 360bbb-3(b)(1), unless the authorization is terminated or revoked sooner. Performed at [facility], [place] [address] [place], [place] [address]   SARS CORONAVIRUS 2 (TAT 6-24 HRS) Nasopharyngeal Nasopharyngeal Swab     Status: None   Collection Time: 04/25/19 12:22 PM   Specimen: Nasopharyngeal Swab  Result Value Ref Range   SARS Coronavirus 2 NEGATIVE NEGATIVE    Comment: (NOTE) SARS-CoV-2 target nucleic acids are NOT DETECTED. The SARS-CoV-2 RNA is generally detectable in upper and lower respiratory specimens during the acute phase of infection. Negative results do not preclude SARS-CoV-2 infection, do not rule out co-infections with other pathogens, and should not be used as the sole basis for treatment or other patient management decisions. Negative results must be  combined with clinical observations, patient history, and epidemiological information. The expected result is Negative. Fact Sheet for Patients: SugarRoll.be Fact Sheet for Healthcare Providers: https://www.woods-mathews.com/ This test is not yet approved or cleared by the [place] FDA and  has been authorized for detection and/or diagnosis of SARS-CoV-2 by FDA under an Emergency Use Authorization (EUA). This EUA will remain  in effect (meaning this test can be used) for the duration of the COVID-19 declaration  under Section 56 4(b)(1) of the Act, 21 U.S.C. section 360bbb-3(b)(1), unless the authorization is terminated or revoked sooner. Performed at [facility], [place] [address], [place], [place] [address]       PHQ2/9: Depression screen [facility] 2/9 05/05/2019 03/14/2019 01/27/2019 12/23/2018 10/28/2018  Decreased Interest 0 0 0 2 0  Down, Depressed, Hopeless 1 0 0 2 0  PHQ - 2 Score 1 0 0 4 0  Altered sleeping 3 3 2 3 3   Tired, decreased energy 2 3 1 2 2   Change in appetite 1 1 0 1 1  Feeling bad or failure about yourself  0 0 0 0 0  Trouble concentrating 0 0 0 1 0  Moving slowly or fidgety/restless 0 0 0 1 0  Suicidal thoughts 0 0 0 0 0  PHQ-9 Score 7 7 3 12 6   Difficult doing work/chores Not difficult at all Somewhat difficult Not difficult at all Somewhat difficult Somewhat difficult  Some recent data might be hidden    phq 9 is positive   Fall Risk: Fall Risk  05/05/2019 03/14/2019 01/27/2019 12/26/2018 12/23/2018  Falls in the past year? 0 1 1 0 1  Number falls in past yr: 0 0 0 - 0  Injury with Fall? 0 0 0 - 0  Risk Factor Category  - - - - -  Risk for fall due to : - - - - -  Risk for fall due to: Comment - - - - -  Follow up - - - - -     Assessment & Plan  1. Dyslipidemia  - Lipid panel - atorvastatin (LIPITOR) 40 MG tablet; Take 1 tablet (40 mg total) by mouth daily at 6 PM.  Dispense: 90 tablet; Refill: 1  2. Iron deficiency anemia, unspecified iron deficiency anemia type  - CBC with Differential/Platelet - Iron, TIBC and Ferritin Panel  3. Essential hypertension  - COMPLETE METABOLIC PANEL WITH GFR - CBC with Differential/Platelet - olmesartan-hydrochlorothiazide (BENICAR HCT) 40-12.5 MG tablet; Take 0.5 tablets by mouth daily.  Dispense: 90 tablet; Refill: 1  4. Pre-diabetes  - Hemoglobin A1c  5. Asthma with COPD ([place])  She is watching her grandchildren and has noticed increase in sob, explained likely out of shape, to sit down and rest before using  rescue inhlaer  6. Bipolar 1 disorder, mixed ([place])  stable  7. Fatigue, unspecified type  We will check labs today   8. Fibromyalgia   9. Adult hypothyroidism  - TSH  10. Hyperglycemia  - Hemoglobin A1c  11. Mild episode of recurrent major depressive disorder (H[facility])  Continue medication   12. Vitamin D deficiency  - VITAMIN D 25 Hydroxy (Vit-D Deficiency, Fractures)

## 2019-05-06 LAB — IRON,TIBC AND FERRITIN PANEL
%SAT: 13 % (calc) — ABNORMAL LOW (ref 16–45)
Ferritin: 11 ng/mL — ABNORMAL LOW (ref 16–288)
Iron: 44 ug/dL — ABNORMAL LOW (ref 45–160)
TIBC: 350 mcg/dL (calc) (ref 250–450)

## 2019-05-06 LAB — COMPLETE METABOLIC PANEL WITH GFR
AG Ratio: 1.7 (calc) (ref 1.0–2.5)
ALT: 8 U/L (ref 6–29)
AST: 12 U/L (ref 10–35)
Albumin: 3.9 g/dL (ref 3.6–5.1)
Alkaline phosphatase (APISO): 69 U/L (ref 37–153)
BUN: 11 mg/dL (ref 7–25)
CO2: 29 mmol/L (ref 20–32)
Calcium: 9.1 mg/dL (ref 8.6–10.4)
Chloride: 104 mmol/L (ref 98–110)
Creat: 0.74 mg/dL (ref 0.50–0.99)
GFR, Est African American: 96 mL/min/{1.73_m2} (ref >=60)
GFR, Est Non African American: 83 mL/min/{1.73_m2} (ref >=60)
Globulin: 2.3 g/dL (calc) (ref 1.9–3.7)
Glucose, Bld: 151 mg/dL — ABNORMAL HIGH (ref 65–139)
Potassium: 4.7 mmol/L (ref 3.5–5.3)
Sodium: 140 mmol/L (ref 135–146)
Total Bilirubin: 0.3 mg/dL (ref 0.2–1.2)
Total Protein: 6.2 g/dL (ref 6.1–8.1)

## 2019-05-06 LAB — CBC WITH DIFFERENTIAL/PLATELET
Absolute Monocytes: 555 cells/uL (ref 200–950)
Basophils Absolute: 43 cells/uL (ref 0–200)
Basophils Relative: 0.7 %
Eosinophils Absolute: 378 cells/uL (ref 15–500)
Eosinophils Relative: 6.2 %
HCT: 35.7 % (ref 35.0–45.0)
Hemoglobin: 11.6 g/dL — ABNORMAL LOW (ref 11.7–15.5)
Lymphs Abs: 2312 cells/uL (ref 850–3900)
MCH: 27.7 pg (ref 27.0–33.0)
MCHC: 32.5 g/dL (ref 32.0–36.0)
MCV: 85.2 fL (ref 80.0–100.0)
MPV: 10.5 fL (ref 7.5–12.5)
Monocytes Relative: 9.1 %
Neutro Abs: 2812 cells/uL (ref 1500–7800)
Neutrophils Relative %: 46.1 %
Platelets: 248 10*3/uL (ref 140–400)
RBC: 4.19 10*6/uL (ref 3.80–5.10)
RDW: 15.2 % — ABNORMAL HIGH (ref 11.0–15.0)
Total Lymphocyte: 37.9 %
WBC: 6.1 10*3/uL (ref 3.8–10.8)

## 2019-05-06 LAB — HEMOGLOBIN A1C
Hgb A1c MFr Bld: 6.3 % of total Hgb — ABNORMAL HIGH (ref <5.7)
Mean Plasma Glucose: 134 (calc)
eAG (mmol/L): 7.4 (calc)

## 2019-05-06 LAB — LIPID PANEL
Cholesterol: 192 mg/dL (ref <200)
HDL: 57 mg/dL (ref >=50)
LDL Cholesterol (Calc): 91 mg/dL (calc)
Non-HDL Cholesterol (Calc): 135 mg/dL (calc) — ABNORMAL HIGH (ref <130)
Total CHOL/HDL Ratio: 3.4 (calc) (ref <5.0)
Triglycerides: 332 mg/dL — ABNORMAL HIGH (ref <150)

## 2019-05-06 LAB — VITAMIN D 25 HYDROXY (VIT D DEFICIENCY, FRACTURES): Vit D, 25-Hydroxy: 20 ng/mL — ABNORMAL LOW (ref 30–100)

## 2019-05-06 LAB — TSH: TSH: 1.95 mIU/L (ref 0.40–4.50)

## 2019-05-07 ENCOUNTER — Other Ambulatory Visit: Payer: Self-pay | Admitting: Family Medicine

## 2019-05-07 DIAGNOSIS — D509 Iron deficiency anemia, unspecified: Secondary | ICD-10-CM

## 2019-05-13 ENCOUNTER — Inpatient Hospital Stay: Payer: Medicare Other | Attending: Oncology | Admitting: Oncology

## 2019-05-13 ENCOUNTER — Other Ambulatory Visit: Payer: Self-pay

## 2019-05-13 ENCOUNTER — Encounter: Payer: Self-pay | Admitting: Oncology

## 2019-05-13 DIAGNOSIS — Z808 Family history of malignant neoplasm of other organs or systems: Secondary | ICD-10-CM | POA: Diagnosis not present

## 2019-05-13 DIAGNOSIS — R5383 Other fatigue: Secondary | ICD-10-CM | POA: Diagnosis not present

## 2019-05-13 DIAGNOSIS — D509 Iron deficiency anemia, unspecified: Secondary | ICD-10-CM | POA: Diagnosis not present

## 2019-05-13 DIAGNOSIS — D500 Iron deficiency anemia secondary to blood loss (chronic): Secondary | ICD-10-CM

## 2019-05-13 DIAGNOSIS — Z809 Family history of malignant neoplasm, unspecified: Secondary | ICD-10-CM

## 2019-05-13 HISTORY — DX: Iron deficiency anemia, unspecified: D50.9

## 2019-05-13 NOTE — Progress Notes (Signed)
Patient here for evaluation of anemia.

## 2019-05-15 ENCOUNTER — Encounter: Payer: Self-pay | Admitting: Urology

## 2019-05-15 ENCOUNTER — Other Ambulatory Visit: Payer: Self-pay

## 2019-05-15 ENCOUNTER — Ambulatory Visit (INDEPENDENT_AMBULATORY_CARE_PROVIDER_SITE_OTHER): Payer: Medicare Other | Admitting: Urology

## 2019-05-15 VITALS — BP 167/68 | HR 91 | Ht 64.0 in | Wt 202.0 lb

## 2019-05-15 DIAGNOSIS — R319 Hematuria, unspecified: Secondary | ICD-10-CM | POA: Diagnosis not present

## 2019-05-15 DIAGNOSIS — Z87448 Personal history of other diseases of urinary system: Secondary | ICD-10-CM

## 2019-05-15 LAB — URINALYSIS, COMPLETE
Bilirubin, UA: NEGATIVE
Glucose, UA: NEGATIVE
Leukocytes,UA: NEGATIVE
Nitrite, UA: NEGATIVE
Protein,UA: NEGATIVE
Specific Gravity, UA: 1.025 (ref 1.005–1.030)
Urobilinogen, Ur: 0.2 mg/dL (ref 0.2–1.0)
pH, UA: 5.5 (ref 5.0–7.5)

## 2019-05-15 LAB — MICROSCOPIC EXAMINATION
Epithelial Cells (non renal): >10 /HPF — AB (ref 0–10)
RBC, Urine: NONE SEEN /HPF (ref 0–2)

## 2019-05-15 MED ORDER — LIDOCAINE HCL URETHRAL/MUCOSAL 2 % EX GEL
1.0000 "application " | Freq: Once | CUTANEOUS | Status: AC
  Administered 2019-05-15: 1 via URETHRAL

## 2019-05-15 NOTE — Progress Notes (Signed)
Hematology/Oncology Consult note [facility] Telephone:([phone] [phone]   Patient Care Team: [name], MD as PCP - General (Family Medicine) Bary Castilla, Forest Gleason, MD (General Surgery) Bo Merino, MD as Consulting Physician (Rheumatology) Providence Lanius, MD as Referring Physician (Neurology) Yolonda Kida, MD as Consulting Physician (Cardiology) Laneta Simmers as Physician Assistant (Urology) Vladimir Crofts, MD as Consulting Physician (Neurology) Marlou Sa Tonna Corner, MD as Consulting Physician (Orthopedic Surgery) Clyde Canterbury, MD as Consulting Physician (Otolaryngology) Gabriel Carina Betsey Holiday, MD as Physician Assistant (Endocrinology)  REFERRING PROVIDER: [name], MD CHIEF COMPLAINTS/REASON FOR VISIT:  Evaluation of iron deficiency anemia  HISTORY OF PRESENTING ILLNESS:  Jacqueline Lucas is a  69 y.o.  female with PMH listed below was seen in consultation at the request of [name], MD   for evaluation of iron deficiency anemia.   Reviewed patient's recent labs  05/05/2019 labs revealed anemia with hemoglobin of 11.6, iron panel showed TIBC 350, iron saturation 13, ferritin 11. Reviewed patient's previous labs ordered by primary care physician's office, iron deficiency is chronic onset , duration is since August 2020. No aggravating or improving factors.  Associated signs and symptoms: Patient reports fatigue.  Denies SOB with exertion.  Denies weight loss, easy bruising, hematochezia, hemoptysis, hematuria. Context:  History of iron deficiency: Denies Rectal bleeding: Denies Menstrual bleeding/ Vaginal bleeding : Denies Hematemesis or hemoptysis : denies Blood in urine : denies      Review of Systems  Constitutional: Positive for fatigue. Negative for appetite change, chills and fever.  HENT:   Negative for hearing loss and voice change.   Eyes: Negative for eye problems.  Respiratory: Negative  for chest tightness and cough.   Cardiovascular: Negative for chest pain.  Gastrointestinal: Negative for abdominal distention, abdominal pain and blood in stool.  Endocrine: Negative for hot flashes.  Genitourinary: Negative for difficulty urinating and frequency.   Musculoskeletal: Negative for arthralgias.  Skin: Negative for itching and rash.  Neurological: Negative for extremity weakness.       Forgetful  Hematological: Negative for adenopathy.  Psychiatric/Behavioral: Negative for confusion.    MEDICAL HISTORY:  Past Medical History:  Diagnosis Date  . Allergy   . Allergy to adhesive tape    Allergy to paper tape as well  . Alzheimer disease ([place])   . Anemia   . Anginal pain ([place])   . Anxiety   . Arthritis   . Asthma   . Bronchitis   . Cataract   . Chronic nausea   . Claustrophobia   . DDD (degenerative disc disease), lumbar   . Depression   . Dry eyes    uses Restasis  . Fibromyalgia   . GERD (gastroesophageal reflux disease)   . H/O bladder infections   . Heart murmur   . Hematuria   . Hyperlipidemia   . Hypertension   . Hypothyroidism   . IBS (irritable bowel syndrome)   . IDA (iron deficiency anemia) 05/13/2019  . Insomnia   . Lumbar neuritis   . Migraines   . Obesity   . Osteoporosis   . Palpitation   . RLS (restless legs syndrome)   . Shoulder fracture, right   . Sicca ([place])   . Tachycardia   . Thyroid disease   . Wears dentures    partial lower    SURGICAL HISTORY: Past Surgical History:  Procedure Laterality Date  . ABDOMINAL HYSTERECTOMY     due to cancer of ovaries  .  BACK SURGERY N/A 2014   x 5  . BACK SURGERY  12/2017  . CARDIAC CATHETERIZATION     no PCI; 12/18/12 ([facility], Dr. Marcello Moores): EP study with ablation. No coronary angiography done then.  [name] CATARACT EXTRACTION W/PHACO Right 04/01/2019   Procedure: CATARACT EXTRACTION PHACO AND INTRAOCULAR LENS PLACEMENT (IOC) RIGHT  TORIC 6.19 00:40.1;  Surgeon: Birder Robson, MD;  Location:  [place];  Service: Ophthalmology;  Laterality: Right;  . CATARACT EXTRACTION W/PHACO Left 04/29/2019   Procedure: CATARACT EXTRACTION PHACO AND INTRAOCULAR LENS PLACEMENT (IOC)  LEFT TORIC LENS 4.52  00:29.1;  Surgeon: Birder Robson, MD;  Location: [place];  Service: Ophthalmology;  Laterality: Left;  . COLONOSCOPY W/ POLYPECTOMY    . COLONOSCOPY WITH PROPOFOL N/A 02/04/2016   Procedure: COLONOSCOPY WITH PROPOFOL;  Surgeon: Robert Bellow, MD;  Location: [facility] ENDOSCOPY;  Service: Endoscopy;  Laterality: N/A;  . ESOPHAGOGASTRODUODENOSCOPY (EGD) WITH PROPOFOL N/A 02/04/2016   Procedure: ESOPHAGOGASTRODUODENOSCOPY (EGD) WITH PROPOFOL;  Surgeon: Robert Bellow, MD;  Location: [facility] ENDOSCOPY;  Service: Endoscopy;  Laterality: N/A;  . FINGER ARTHROSCOPY WITH CARPOMETACARPEL ([place]) ARTHROPLASTY Left 05/01/2016   Dr. Erlinda Hong  . GIVENS CAPSULE STUDY N/A 05/02/2016   Procedure: GIVENS CAPSULE STUDY;  Surgeon: Manya Silvas, MD;  Location: [facility] ENDOSCOPY;  Service: Endoscopy;  Laterality: N/A;  . HAND SURGERY    . HEMORRHOID SURGERY    . HIP ARTHROPLASTY    . JOINT REPLACEMENT Bilateral    knee  . KNEE ARTHROPLASTY    . REPLACEMENT TOTAL KNEE Left   . SHOULDER ARTHROSCOPY Left 09/09/2018   Procedure: LEFT SHOULDER ARTHROSCOPY, BICEPS TENOTOMY;  Surgeon: Meredith Pel, MD;  Location: [place];  Service: Orthopedics;  Laterality: Left;  . SPINE SURGERY     x 2  . tendonititis     right elbow, right wrist  . TOTAL HIP ARTHROPLASTY Left 2013  . TOTAL HIP ARTHROPLASTY Right 08/16/2015   Procedure: TOTAL HIP ARTHROPLASTY ANTERIOR APPROACH;  Surgeon: Meredith Pel, MD;  Location: [place];  Service: Orthopedics;  Laterality: Right;  . TOTAL KNEE ARTHROPLASTY Right 03/16/2015   Procedure: RIGHT TOTAL KNEE ARTHROPLASTY, PCL SACRIFICING;  Surgeon: Meredith Pel, MD;  Location: [place];  Service: Orthopedics;  Laterality: Right;    SOCIAL HISTORY: Social  History   Socioeconomic History  . Marital status: Widowed    Spouse name: Fritz Pickerel  . Number of children: 4  . Years of education: some college  . Highest education level: 12th grade  Occupational History  . Occupation: Retired  Tobacco Use  . Smoking status: Never Smoker  . Smokeless tobacco: Never Used  . Tobacco comment: smoking cessation materials not required  Substance and Sexual Activity  . Alcohol use: No    Alcohol/week: 0.0 standard drinks  . Drug use: No  . Sexual activity: Never  Other Topics Concern  . Not on file  Social History Narrative   Patient has 10 grandchildren, 4 great-granddaughters & 2 great-grandsons.   Widow since 02/2018 - married for 42 years   Social Determinants of Radio broadcast assistant Strain:   . Difficulty of Paying Living Expenses:   Food Insecurity:   . Worried About Charity fundraiser in the Last Year:   . Arboriculturist in the Last Year:   Transportation Needs:   . Film/video editor (Medical):   [name] Lack of Transportation (Non-Medical):   Physical Activity:   . Days of  Exercise per Week:   . Minutes of Exercise per Session:   Stress: Stress Concern Present  . Feeling of Stress : To some extent  Social Connections:   . Frequency of Communication with Friends and Family:   . Frequency of Social Gatherings with Friends and Family:   . Attends Religious Services:   . Active Member of Clubs or Organizations:   . Attends Archivist Meetings:   [name] Marital Status:   Intimate Partner Violence:   . Fear of Current or Ex-Partner:   . Emotionally Abused:   [name] Physically Abused:   . Sexually Abused:     FAMILY HISTORY: Family History  Problem Relation Age of Onset  . Diabetes Mother   . Hypertension Mother   . Heart disease Mother   . Alzheimer's disease Mother   . Diabetes Father   . Throat cancer Father   . Hypertension Sister   . Depression Sister   . Fibromyalgia Sister   . Cancer Brother   . Heart  disease Brother   . Stroke Son   . Fibromyalgia Daughter   . Bladder Cancer Neg Hx   . Kidney disease Neg Hx   . Kidney cancer Neg Hx     ALLERGIES:  is allergic to morphine; lyrica [pregabalin]; metformin and related; diazepam; latex; rash away  [petrolatum-zinc oxide]; salicylates; and tape.  MEDICATIONS:  Current Outpatient Medications  Medication Sig Dispense Refill  . aspirin 81 MG chewable tablet Chew 81 mg by mouth daily.    [name] atorvastatin (LIPITOR) 40 MG tablet Take 1 tablet (40 mg total) by mouth daily at 6 PM. 90 tablet 1  . baclofen (LIORESAL) 10 MG tablet TAKE ONE TABLET AT 7AM AND ONE TABLET AT 2PM AS NEEDED FOR MUSCLE SPASM 180 tablet 1  . Cholecalciferol (D3-1000) 1000 units tablet Take 1,000 Units by mouth at bedtime.     . diclofenac sodium (VOLTAREN) 1 % GEL Apply 2 g topically 4 (four) times daily. 150 g 2  . divalproex (DEPAKOTE) 250 MG DR tablet TAKE 1 TABLET TWICE A DAY 180 tablet 1  . DULERA 200-5 MCG/ACT AERO USE 2 INHALATIONS ORALLY   TWICE DAILY 39 g 0  . estradiol (ESTRACE) 0.1 MG/GM vaginal cream APPLY A PEA SIZED AMOUNT JUST INSIDE THE VAGINAL INTROITUS WITH A FINGER TIP EVERY NIGHT FOR 2 WEEKS THEN MONDAY, WEDNESDAY, AND FRIDAY NIGHTS THEREAFTER 42.5 g 3  . gabapentin (NEURONTIN) 300 MG capsule TAKE 2 CAPSULE TID 540 capsule 0  . hydrOXYzine (VISTARIL) 25 MG capsule TAKE 1 CAPSULE 3 TIMES A   DAY AS NEEDED FOR ANXIETY 90 capsule 0  . lansoprazole (PREVACID) 30 MG capsule Take 1 capsule (30 mg total) daily by mouth. (Patient taking differently: Take 30 mg by mouth 2 (two) times daily before a meal. ) 90 capsule 1  . levalbuterol (XOPENEX) 1.25 MG/3ML nebulizer solution Take 1.25 mg by nebulization every 4 (four) hours as needed for wheezing. 72 mL 0  . levothyroxine (SYNTHROID, LEVOTHROID) 50 MCG tablet Take 50 mcg by mouth daily before breakfast.    . LINZESS 145 MCG CAPS capsule TAKE 1 CAPSULE DAILY BEFOREBREAKFAST 90 capsule 1  . MAGNESIUM OXIDE PO Take 400  mg by mouth at bedtime. Reported on 07/28/2015    . methocarbamol (ROBAXIN) 500 MG tablet TAKE 1 TABLET BY MOUTH EVERY 8 HOURS AS NEEDED 30 tablet 0  . metoprolol tartrate (LOPRESSOR) 25 MG tablet TAKE 1 TABLET BY MOUTH TWICE A DAY 180 tablet  0  . montelukast (SINGULAIR) 10 MG tablet TAKE 1 TABLET AT BEDTIME 90 tablet 1  . nystatin (MYCOSTATIN) 100000 UNIT/ML suspension TAKE 5 MLS (500,000 UNITS TOTAL) BY MOUTH 4 (FOUR) TIMES DAILY. 480 mL 0  . olmesartan-hydrochlorothiazide (BENICAR HCT) 40-12.5 MG tablet Take 0.5 tablets by mouth daily. 90 tablet 1  . omega-3 acid ethyl esters (LOVAZA) 1 g capsule Take 2 capsules (2 g total) by mouth 2 (two) times daily. 360 capsule 1  . ondansetron (ZOFRAN) 4 MG tablet Take 1 tablet (4 mg total) by mouth every 8 (eight) hours as needed for nausea or vomiting. 20 tablet 1  . oxyCODONE (OXY IR/ROXICODONE) 5 MG immediate release tablet Take 5 mg by mouth every 8 (eight) hours as needed.    . polyethylene glycol (MIRALAX / GLYCOLAX) packet Take 17 g by mouth daily as needed.    . RESTASIS 0.05 % ophthalmic emulsion Place 1 drop into both eyes 2 (two) times daily.     . valACYclovir (VALTREX) 1000 MG tablet Take 1 tablet (1,000 mg total) by mouth 2 (two) times daily. 20 tablet 2  . venlafaxine XR (EFFEXOR-XR) 75 MG 24 hr capsule TAKE 1 CAPSULE (75 MG TOTAL) BY MOUTH DAILY WITH BREAKFAST. 90 capsule 1  . vitamin B-12 (CYANOCOBALAMIN) 1000 MCG tablet Take 1,000 mcg by mouth at bedtime.     No current facility-administered medications for this visit.     PHYSICAL EXAMINATION: ECOG PERFORMANCE STATUS: 1 - Symptomatic but completely ambulatory Vitals:   05/13/19 1531  BP: 111/72  Pulse: 85  Resp: 18  Temp: (!) 96 F (35.6 C)   Filed Weights   05/13/19 1531  Weight: 201 lb 3.2 oz (91.3 kg)    Physical Exam Constitutional:      General: She is not in acute distress. HENT:     Head: Normocephalic and atraumatic.  Eyes:     General: No scleral  icterus. Cardiovascular:     Rate and Rhythm: Normal rate and regular rhythm.     Heart sounds: Normal heart sounds.  Pulmonary:     Effort: Pulmonary effort is normal. No respiratory distress.     Breath sounds: No wheezing.  Abdominal:     General: Bowel sounds are normal. There is no distension.     Palpations: Abdomen is soft.  Musculoskeletal:        General: No deformity. Normal range of motion.     Cervical back: Normal range of motion and neck supple.  Skin:    General: Skin is warm and dry.     Findings: No erythema or rash.  Neurological:     Mental Status: She is alert and oriented to person, place, and time. Mental status is at baseline.     Cranial Nerves: No cranial nerve deficit.     Coordination: Coordination normal.  Psychiatric:        Mood and Affect: Mood normal.       CMP Latest Ref Rng & Units 05/05/2019  Glucose 65 - 139 mg/dL 151(H)  BUN 7 - 25 mg/dL 11  Creatinine 0.50 - 0.99 mg/dL 0.74  Sodium 135 - 146 mmol/L 140  Potassium 3.5 - 5.3 mmol/L 4.7  Chloride 98 - 110 mmol/L 104  CO2 20 - 32 mmol/L 29  Calcium 8.6 - 10.4 mg/dL 9.1  Total Protein 6.1 - 8.1 g/dL 6.2  Total Bilirubin 0.2 - 1.2 mg/dL 0.3  Alkaline Phos 33 - 130 U/L -  AST 10 - 35 U/L  12  ALT 6 - 29 U/L 8   CBC Latest Ref Rng & Units 05/05/2019  WBC 3.8 - 10.8 Thousand/uL 6.1  Hemoglobin 11.7 - 15.5 g/dL 11.6(L)  Hematocrit 35.0 - 45.0 % 35.7  Platelets 140 - 400 Thousand/uL 248     LABORATORY DATA:  I have reviewed the data as listed Lab Results  Component Value Date   WBC 6.1 05/05/2019   HGB 11.6 (L) 05/05/2019   HCT 35.7 05/05/2019   MCV 85.2 05/05/2019   PLT 248 05/05/2019   Recent Labs    09/06/18 1430 10/28/18 0000 12/11/18 1419 03/26/19 1326 05/05/19 1202  NA  --  139 136  --  140  K  --  4.2 4.2  --  4.7  CL  --  103 101  --  104  CO2  --  29 25  --  29  GLUCOSE  --  117* 108*  --  151*  BUN  --  11 18  --  11  CREATININE   < > 0.82 0.88 0.80 0.74   CALCIUM  --  9.4 9.1  --  9.1  GFRNONAA  --  73 >60  --  83  GFRAA  --  85 >60  --  96  PROT  --  6.4  --   --  6.2  AST  --  11  --   --  12  ALT  --  9  --   --  8  BILITOT  --  0.3  --   --  0.3   < > = values in this interval not displayed.   Iron/TIBC/Ferritin/ %Sat    Component Value Date/Time   IRON 44 (L) 05/05/2019 1202   IRON 65 06/08/2016 1522   TIBC 350 05/05/2019 1202   TIBC 308 06/08/2016 1522   FERRITIN 11 (L) 05/05/2019 1202   FERRITIN 33 06/08/2016 1522   IRONPCTSAT 13 (L) 05/05/2019 1202     RADIOGRAPHIC STUDIES: I have personally reviewed the radiological images as listed and agreed with the findings in the report. No results found.     ASSESSMENT & PLAN:  1. Iron deficiency anemia due to chronic blood loss    Labs are reviewed and discussed with patient. Consistent with iron deficiency anemia. Plan IV iron with Venofer 200mg  weekly x 2 doses. Allergy reactions/infusion reaction including anaphylactic reaction discussed with patient. Other side effects include but not limited to high blood pressure, skin rash, weight gain, leg swelling, etc. Patient voices understanding and willing to proceed. She is up to date with upper endoscopy, colonoscopy and capsule study. Capsule study showed 1 area of multiple petechia which could possibly be a source of intermittent bleeding. I called patient's son and updated him about the rationale and potential side effects of IV Venofer treatments.  He agrees with the plan.  Orders Placed This Encounter  Procedures  . CBC with Differential/Platelet    Standing Status:   Future    Standing Expiration Date:   06/25/2020  . Ferritin    Standing Status:   Future    Standing Expiration Date:   06/25/2020  . Iron and TIBC    Standing Status:   Future    Standing Expiration Date:   06/25/2020    All questions were answered. The patient knows to call the clinic with any problems questions or concerns.  Cc [name],  MD  Return of visit: 8 weeks Thank you for this kind referral  and the opportunity to participate in the care of this patient. A copy of today's note is routed to referring provider   Earlie Server, MD, PhD Hematology Oncology [place] at [facility] 05/15/2019

## 2019-05-15 NOTE — Patient Instructions (Signed)
1. We will call you with your urine results

## 2019-05-15 NOTE — Progress Notes (Signed)
Cystoscopy Procedure Note:  Indication: Gross hematuria  After informed consent and discussion of the procedure and its risks, Jacqueline Lucas was positioned and prepped in the standard fashion. Cystoscopy was performed with a flexible cystoscope. The urethra, bladder neck and entire bladder was visualized in a standard fashion. The ureteral orifices were visualized in their normal location and orientation.  Efflux of clear yellow urine from bilateral ureteral orifices.  No abnormalities on retroflexion.  Bladder mucosa grossly normal throughout.  Cytology sent.  Imaging: I personally reviewed her CT and there is no evidence of hydronephrosis, stones, filling defects, or masses  Findings: Normal cystoscopy  Assessment and Plan: Call with cytology results  Suspect her gross hematuria is from vaginal atrophy, she reports her hematuria is almost completely resolved since starting Premarin cream.  She also has a history of a negative hematuria work-up in 2015.  Nickolas Madrid, MD 05/15/2019

## 2019-05-19 ENCOUNTER — Telehealth: Payer: Self-pay | Admitting: *Deleted

## 2019-05-19 ENCOUNTER — Other Ambulatory Visit: Payer: Self-pay

## 2019-05-19 DIAGNOSIS — D509 Iron deficiency anemia, unspecified: Secondary | ICD-10-CM

## 2019-05-19 LAB — POC HEMOCCULT BLD/STL (HOME/3-CARD/SCREEN)
Card #2 Fecal Occult Blod, POC: NEGATIVE
Card #3 Fecal Occult Blood, POC: NEGATIVE
Fecal Occult Blood, POC: NEGATIVE

## 2019-05-19 NOTE — Telephone Encounter (Signed)
Are we still keeping her lab and MD visits as sched?

## 2019-05-19 NOTE — Telephone Encounter (Signed)
She may try oral iron and if still low, I will discuss more at her next visit. Thanks.

## 2019-05-19 NOTE — Telephone Encounter (Signed)
Patient called reporting that she has decided not to take "the infusions and wants to know if ok to take oral iron" Please advise

## 2019-05-19 NOTE — Telephone Encounter (Signed)
Left message on voice mail that inf appts cancelled and ok to try oral iron

## 2019-05-20 NOTE — Telephone Encounter (Signed)
Keep lab/MD appts as scheduled.

## 2019-05-21 ENCOUNTER — Other Ambulatory Visit: Payer: Self-pay | Admitting: Urology

## 2019-05-21 ENCOUNTER — Other Ambulatory Visit: Payer: Self-pay | Admitting: Family Medicine

## 2019-05-21 ENCOUNTER — Telehealth: Payer: Self-pay

## 2019-05-21 DIAGNOSIS — F3160 Bipolar disorder, current episode mixed, unspecified: Secondary | ICD-10-CM

## 2019-05-21 NOTE — Telephone Encounter (Signed)
Called pt, no answer. Left detailed message per DPR informing pt of negative urine cytology. Advised pt to call back for questions or concerns

## 2019-05-21 NOTE — Progress Notes (Signed)
No cancer cells seen on cytology from cysto, keep follow up with [facility] as scheduled. Good news  Nickolas Madrid, MD 05/21/2019

## 2019-05-21 NOTE — Telephone Encounter (Signed)
Copied from [place] [phone]. Topic: General - Other >> May 21, 2019  3:17 PM Celene Kras wrote: Reason for CRM: Pt called stating that she went to her cancer doctor and she gave pt the option to take blood pressure pills instead of getting infusions. Pt states that she would like to take the pills and is requesting to know how much iron she needs to take. Please advise.

## 2019-05-21 NOTE — Telephone Encounter (Signed)
-----   Message from Billey Co, MD sent at 05/21/2019 10:25 AM EDT -----   ----- Message ----- From: Benard Halsted Sent: 05/21/2019   9:10 AM EDT To: Billey Co, MD

## 2019-05-22 NOTE — Telephone Encounter (Signed)
Patient notified to take Iron tablets 325 mg bid per Dr. Ancil Boozer recommendations.

## 2019-05-23 ENCOUNTER — Inpatient Hospital Stay: Payer: Medicare Other

## 2019-05-26 DIAGNOSIS — D509 Iron deficiency anemia, unspecified: Secondary | ICD-10-CM | POA: Diagnosis not present

## 2019-05-26 DIAGNOSIS — R110 Nausea: Secondary | ICD-10-CM | POA: Diagnosis not present

## 2019-05-26 DIAGNOSIS — K5909 Other constipation: Secondary | ICD-10-CM | POA: Diagnosis not present

## 2019-05-26 DIAGNOSIS — K219 Gastro-esophageal reflux disease without esophagitis: Secondary | ICD-10-CM | POA: Diagnosis not present

## 2019-05-26 DIAGNOSIS — R140 Abdominal distension (gaseous): Secondary | ICD-10-CM | POA: Diagnosis not present

## 2019-05-29 ENCOUNTER — Ambulatory Visit: Payer: Medicare Other | Admitting: Rheumatology

## 2019-05-29 ENCOUNTER — Other Ambulatory Visit: Payer: Self-pay

## 2019-05-29 ENCOUNTER — Encounter: Payer: Self-pay | Admitting: Rheumatology

## 2019-05-29 ENCOUNTER — Ambulatory Visit (INDEPENDENT_AMBULATORY_CARE_PROVIDER_SITE_OTHER): Payer: Medicare Other | Admitting: Rheumatology

## 2019-05-29 VITALS — BP 133/70 | HR 88 | Resp 17 | Ht 64.0 in | Wt 202.0 lb

## 2019-05-29 DIAGNOSIS — Z96643 Presence of artificial hip joint, bilateral: Secondary | ICD-10-CM | POA: Diagnosis not present

## 2019-05-29 DIAGNOSIS — M8589 Other specified disorders of bone density and structure, multiple sites: Secondary | ICD-10-CM | POA: Diagnosis not present

## 2019-05-29 DIAGNOSIS — M5136 Other intervertebral disc degeneration, lumbar region: Secondary | ICD-10-CM

## 2019-05-29 DIAGNOSIS — M1812 Unilateral primary osteoarthritis of first carpometacarpal joint, left hand: Secondary | ICD-10-CM

## 2019-05-29 DIAGNOSIS — M3501 Sicca syndrome with keratoconjunctivitis: Secondary | ICD-10-CM | POA: Diagnosis not present

## 2019-05-29 DIAGNOSIS — Z8679 Personal history of other diseases of the circulatory system: Secondary | ICD-10-CM | POA: Diagnosis not present

## 2019-05-29 DIAGNOSIS — M7061 Trochanteric bursitis, right hip: Secondary | ICD-10-CM | POA: Diagnosis not present

## 2019-05-29 DIAGNOSIS — M51369 Other intervertebral disc degeneration, lumbar region without mention of lumbar back pain or lower extremity pain: Secondary | ICD-10-CM

## 2019-05-29 DIAGNOSIS — Z96653 Presence of artificial knee joint, bilateral: Secondary | ICD-10-CM | POA: Diagnosis not present

## 2019-05-29 DIAGNOSIS — Z8659 Personal history of other mental and behavioral disorders: Secondary | ICD-10-CM

## 2019-05-29 DIAGNOSIS — M7062 Trochanteric bursitis, left hip: Secondary | ICD-10-CM | POA: Diagnosis not present

## 2019-05-29 DIAGNOSIS — M797 Fibromyalgia: Secondary | ICD-10-CM

## 2019-05-29 DIAGNOSIS — H16229 Keratoconjunctivitis sicca, not specified as Sjogren's, unspecified eye: Secondary | ICD-10-CM

## 2019-05-29 NOTE — Progress Notes (Signed)
Office Visit Note  Patient: Jacqueline Lucas             Date of Birth: 12/04/1950           MRN: [id number]             PCP: [name], MD Referring: [name], MD Visit Date: 05/29/2019 Occupation: @GUAROCC @  Subjective:  Trochanteric bursitis bilaterally    History of Present Illness: Jacqueline Lucas is a 69 y.o. female with history of fibromyalgia and DDD.  Patient has noted a significant improvement since increasing the dose of gabapentin to 300 mg 2 capsules in the morning, 1 capsule in the afternoon, and 2 capsules at bedtime.  She states that overall her fibromyalgia pain has been tolerable on the current treatment regimen.  She presents today with trochanteric bursitis bilaterally.  She would like cortisone injections.  She states that she has been having intermittent discomfort in the left shoulder joint.  She states that she has had arthroscopic surgery performed by Dr. Marlou Sa within the past year.  She states that she followed up with Dr. Marlou Sa and had repeat x-rays which were unremarkable according to the patient.  She plans on following up with Dr. Marlou Sa if her discomfort persists or worsens.  She reports that bilateral hip replacements and bilateral knee replacements are doing well.   Activities of Daily Living:  Patient reports morning stiffness for 30 minutes.   Patient Reports nocturnal pain.  Difficulty dressing/grooming: Denies Difficulty climbing stairs: Denies Difficulty getting out of chair: Denies Difficulty using hands for taps, buttons, cutlery, and/or writing: Denies  Review of Systems  Constitutional: Positive for fatigue.  HENT: Positive for mouth dryness. Negative for mouth sores and nose dryness.   Eyes: Positive for dryness.  Respiratory: Negative for shortness of breath and difficulty breathing.   Cardiovascular: Negative for chest pain and palpitations.  Gastrointestinal: Negative for blood in stool, constipation and diarrhea.   Endocrine: Negative for increased urination.  Genitourinary: Negative for difficulty urinating.  Musculoskeletal: Positive for arthralgias, joint pain, joint swelling, myalgias, morning stiffness, muscle tenderness and myalgias.  Skin: Negative for rash and redness.  Allergic/Immunologic: Negative for susceptible to infections.  Neurological: Positive for numbness and headaches. Negative for dizziness and weakness.  Hematological: Negative for bruising/bleeding tendency.  Psychiatric/Behavioral: Positive for sleep disturbance.    PMFS History:  Patient Active Problem List   Diagnosis Date Noted  . IDA (iron deficiency anemia) 05/13/2019  . Superior glenoid labrum lesion of left shoulder   . Lumbar stenosis 02/04/2018  . Hx of hysterectomy 08/13/2017  . Pain in left wrist 06/12/2017  . Chronic venous insufficiency 06/09/2017  . Varicose veins of both lower extremities with pain 04/30/2017  . Claudication ([place]) 04/30/2017  . Arthritis of carpometacarpal [facility]) joint of left thumb 05/12/2016  . Bilateral thumb pain 05/01/2016  . Anemia 02/03/2016  . Status post bilateral total hip replacement 01/10/2016  . Sicca ([place]) 01/10/2016  . Age related osteoporosis 01/10/2016  . Hip arthritis 08/16/2015  . Microscopic hematuria 07/30/2015  . Vaginal atrophy 07/30/2015  . GAD (generalized anxiety disorder) 07/08/2015  . Moderate episode of recurrent major depressive disorder ([place]) 07/08/2015  . Migraine with aura and without status migrainosus, not intractable 07/08/2015  . Dyslipidemia 07/08/2015  . Degenerative arthritis of right knee 03/16/2015  . Atrophic vaginitis 01/31/2015  . Recurrent UTI 12/31/2014  . IBS (irritable bowel syndrome) 10/29/2014  . Asthma, moderate persistent 09/08/2014  . Allergic state 09/07/2014  .  Colon polyp 09/07/2014  . Hemorrhoid 09/07/2014  . Constrictive tenosynovitis 03/12/2014  . H/O total knee replacement, bilateral 07/31/2013  . CAD (coronary artery  disease) 11/28/2012  . Anxiety 11/28/2012  . Acid reflux 11/28/2012  . Cardiac murmur 11/28/2012  . BP (high blood pressure) 11/28/2012  . Cannot sleep 11/28/2012  . Adiposity 11/28/2012  . Restless leg 11/28/2012  . Supraventricular tachycardia ([place]) 11/28/2012  . Fibromyalgia 11/28/2012  . Tachycardia 11/28/2012    Past Medical History:  Diagnosis Date  . Allergy   . Allergy to adhesive tape    Allergy to paper tape as well  . Alzheimer disease ([place])   . Anemia   . Anginal pain ([place])   . Anxiety   . Arthritis   . Asthma   . Bronchitis   . Cataract   . Chronic nausea   . Claustrophobia   . DDD (degenerative disc disease), lumbar   . Depression   . Dry eyes    uses Restasis  . Fibromyalgia   . GERD (gastroesophageal reflux disease)   . H/O bladder infections   . Heart murmur   . Hematuria   . Hyperlipidemia   . Hypertension   . Hypothyroidism   . IBS (irritable bowel syndrome)   . IDA (iron deficiency anemia) 05/13/2019  . Insomnia   . Lumbar neuritis   . Migraines   . Obesity   . Osteoporosis   . Palpitation   . RLS (restless legs syndrome)   . Shoulder fracture, right   . Sicca ([place])   . Tachycardia   . Thyroid disease   . Wears dentures    partial lower    Family History  Problem Relation Age of Onset  . Diabetes Mother   . Hypertension Mother   . Heart disease Mother   . Alzheimer's disease Mother   . Diabetes Father   . Throat cancer Father   . Hypertension Sister   . Depression Sister   . Fibromyalgia Sister   . Cancer Brother   . Heart disease Brother   . Stroke Son   . Fibromyalgia Daughter   . Bladder Cancer Neg Hx   . Kidney disease Neg Hx   . Kidney cancer Neg Hx    Past Surgical History:  Procedure Laterality Date  . ABDOMINAL HYSTERECTOMY     due to cancer of ovaries  . BACK SURGERY N/A 2014   x 5  . BACK SURGERY  12/2017  . CARDIAC CATHETERIZATION     no PCI; 12/18/12 ([facility], Dr. Marcello Moores): EP study with ablation. No coronary  angiography done then.  [name] CATARACT EXTRACTION W/PHACO Right 04/01/2019   Procedure: CATARACT EXTRACTION PHACO AND INTRAOCULAR LENS PLACEMENT (IOC) RIGHT  TORIC 6.19 00:40.1;  Surgeon: Birder Robson, MD;  Location: [place];  Service: Ophthalmology;  Laterality: Right;  . CATARACT EXTRACTION W/PHACO Left 04/29/2019   Procedure: CATARACT EXTRACTION PHACO AND INTRAOCULAR LENS PLACEMENT (IOC)  LEFT TORIC LENS 4.52  00:29.1;  Surgeon: Birder Robson, MD;  Location: [place];  Service: Ophthalmology;  Laterality: Left;  . COLONOSCOPY W/ POLYPECTOMY    . COLONOSCOPY WITH PROPOFOL N/A 02/04/2016   Procedure: COLONOSCOPY WITH PROPOFOL;  Surgeon: Robert Bellow, MD;  Location[facility] ENDOSCOPY;  Service: Endoscopy;  Laterality: N/A;  . ESOPHAGOGASTRODUODENOSCOPY (EGD) WITH PROPOFOL N/A 02/04/2016   Procedure: ESOPHAGOGASTRODUODENOSCOPY (EGD) WITH PROPOFOL;  Surgeon: Robert Bellow, MD;  Location: [facility] ENDOSCOPY;  Service: Endoscopy;  Laterality: N/A;  . FINGER ARTHROSCOPY WITH CARPOMETACARPEL (  [place]) ARTHROPLASTY Left 05/01/2016   Dr. Erlinda Hong  . GIVENS CAPSULE STUDY N/A 05/02/2016   Procedure: GIVENS CAPSULE STUDY;  Surgeon: Manya Silvas, MD;  Location[facility] ENDOSCOPY;  Service: Endoscopy;  Laterality: N/A;  . HAND SURGERY    . HEMORRHOID SURGERY    . HIP ARTHROPLASTY    . JOINT REPLACEMENT Bilateral    knee  . KNEE ARTHROPLASTY    . REPLACEMENT TOTAL KNEE Left   . SHOULDER ARTHROSCOPY Left 09/09/2018   Procedure: LEFT SHOULDER ARTHROSCOPY, BICEPS TENOTOMY;  Surgeon: Meredith Pel, MD;  Location: [place];  Service: Orthopedics;  Laterality: Left;  . SPINE SURGERY     x 2  . tendonititis     right elbow, right wrist  . TOTAL HIP ARTHROPLASTY Left 2013  . TOTAL HIP ARTHROPLASTY Right 08/16/2015   Procedure: TOTAL HIP ARTHROPLASTY ANTERIOR APPROACH;  Surgeon: Meredith Pel, MD;  Location: [place];  Service: Orthopedics;  Laterality: Right;  . TOTAL KNEE  ARTHROPLASTY Right 03/16/2015   Procedure: RIGHT TOTAL KNEE ARTHROPLASTY, PCL SACRIFICING;  Surgeon: Meredith Pel, MD;  Location: [place];  Service: Orthopedics;  Laterality: Right;   Social History   Social History Narrative   Patient has 10 grandchildren, 4 great-granddaughters & 2 great-grandsons.   Widow since 02/2018 - married for 42 years   Immunization History  Administered Date(s) Administered  . Fluad Quad(high Dose 65+) 10/28/2018  . Influenza, High Dose Seasonal PF 02/15/2015, 11/10/2015, 01/11/2017, 12/26/2017  . Influenza-Unspecified 02/25/2014, 12/31/2017  . Pneumococcal Conjugate-13 06/03/2013  . Pneumococcal Polysaccharide-23 09/05/2011, 01/11/2017  . Tdap 09/05/2011  . Zoster 12/12/2010     Objective: Vital Signs: BP 133/70 (BP Location: Left Arm, Patient Position: Sitting, Cuff Size: Normal)   Pulse 88   Resp 17   Ht 5\' 4"  (1.626 m)   Wt 202 lb (91.6 kg)   BMI 34.67 kg/m    Physical Exam Vitals and nursing note reviewed.  Constitutional:      Appearance: She is well-developed.  HENT:     Head: Normocephalic and atraumatic.  Eyes:     Conjunctiva/sclera: Conjunctivae normal.  Pulmonary:     Effort: Pulmonary effort is normal.  Abdominal:     General: Bowel sounds are normal.     Palpations: Abdomen is soft.  Musculoskeletal:     Cervical back: Normal range of motion.  Lymphadenopathy:     Cervical: No cervical adenopathy.  Skin:    General: Skin is warm and dry.     Capillary Refill: Capillary refill takes less than 2 seconds.  Neurological:     Mental Status: She is alert and oriented to person, place, and time.  Psychiatric:        Behavior: Behavior normal.      Musculoskeletal Exam: C-spine good range of motion.  Postural thoracic kyphosis noted.  Right shoulder has full range of motion with no discomfort.  Left shoulder has painful and limited abduction and internal rotation.  Elbow joints have good range of motion with no tenderness or  inflammation.  Wrist joints, MCPs, PIPs and DIPs good range of motion with no synovitis.  She has PIP and DIP thickening consistent with osteoarthritis of both hands.  She has complete fist formation bilaterally.  Hip joint replacements have good range of motion with no discomfort.  She has tenderness over bilateral trochanteric bursa.  Knee joint replacements have good range of motion with warmth but no effusion.  Ankle joints have good range  of motion with no tenderness or inflammation.  CDAI Exam: CDAI Score: - Patient Global: -; Provider Global: - Swollen: -; Tender: - Joint Exam 05/29/2019   No joint exam has been documented for this visit   There is currently no information documented on the homunculus. Go to the Rheumatology activity and complete the homunculus joint exam.  Investigation: No additional findings.  Imaging: No results found.  Recent Labs: Lab Results  Component Value Date   WBC 6.1 05/05/2019   HGB 11.6 (L) 05/05/2019   PLT 248 05/05/2019   NA 140 05/05/2019   K 4.7 05/05/2019   CL 104 05/05/2019   CO2 29 05/05/2019   GLUCOSE 151 (H) 05/05/2019   BUN 11 05/05/2019   CREATININE 0.74 05/05/2019   BILITOT 0.3 05/05/2019   ALKPHOS 105 12/10/2015   AST 12 05/05/2019   ALT 8 05/05/2019   PROT 6.2 05/05/2019   ALBUMIN 4.0 12/10/2015   CALCIUM 9.1 05/05/2019   GFRAA 96 05/05/2019    Speciality Comments: No specialty comments available.  Procedures:  Large Joint Inj: bilateral greater trochanter on 05/29/2019 4:20 PM Indications: pain Details: 27 G 1.5 in needle, lateral approach  Arthrogram: No  Medications (Right): 1.5 mL lidocaine 1 %; 40 mg triamcinolone acetonide 40 MG/ML Aspirate (Right): 0 mL Medications (Left): 1.5 mL lidocaine 1 %; 40 mg triamcinolone acetonide 40 MG/ML Aspirate (Left): 0 mL Outcome: tolerated well, no immediate complications Procedure, treatment alternatives, risks and benefits explained, specific risks discussed. Consent  was given by the patient. Immediately prior to procedure a time out was called to verify the correct patient, procedure, equipment, support staff and site/side marked as required. Patient was prepped and draped in the usual sterile fashion.     Allergies: Morphine, Lyrica [pregabalin], Metformin and related, Diazepam, Latex, Rash away  [petrolatum-zinc oxide], Salicylates, and Tape   Assessment / Plan:     Visit Diagnoses: Fibromyalgia - She has generalized hyperalgesia and positive tender points on exam.  She has noticed an improvement in her myalgias and muscle tenderness since increasing the dose of gabapentin.  She is taking gabapentin 300 mg 2 capsules in the morning, 1 capsule in afternoon, and 2 capsules at bedtime.  She also takes Robaxin 500 mg every 8 hours as needed for muscle spasms.  According to the patient her pain has been well managed on the current treatment regimen.  She presents today with trochanter bursitis bilaterally.  She requested cortisone injections today.  She was encouraged to perform stretching exercises daily.  We discussed the importance of regular exercise and good sleep hygiene.  She will follow-up in the office in 6 months.  Trochanteric bursitis of both hips: She has tenderness to palpation over bilateral trochanteric bursa.  She has been experiencing nocturnal pain and difficulty walking due to the discomfort.  She requested bilateral trochanteric bursa cortisone injections.  She tolerated the procedure well.  The procedure note was completed above.  Aftercare was discussed.  She was encouraged to perform stretching exercises on a daily basis.  DDD (degenerative disc disease), lumbar: She experiences intermittent lower back pain.  She has been having to sleep on her back recently due to bilateral trochanteric bursitis.  She states this is because some increased discomfort in her lower back.  She has no midline spinal tenderness.   Status post bilateral total hip  replacement: Doing well.  She has good range of motion with no discomfort.  H/O total knee replacement, bilateral: Doing well.  She  has good range of motion with no discomfort.  Warmth of bilateral knee replacements noted but no effusion.  Primary osteoarthritis of first carpometacarpal joint of left hand: She experiences intermittent left CMC joint pain.  She has no tenderness on exam today.  She has PIP and DIP thickening consistent with osteoarthritis of both hands.  She has complete fist formation bilaterally.  No synovitis was noted.  Joint protection and muscle strengthening were discussed.  Keratoconjunctivitis sicca ([place]): She continues to have severe eye dryness bilaterally.  She applies eyedrops as needed for symptomatic relief.  She continues to follow-up with her ophthalmologist closely.  Osteopenia of multiple sites - Last DEXA on 09/25/2017 ordered by Dr. Ancil Boozer showed T score of 0.0 at radius.  She is taking a vitamin D supplement.  Other medical conditions are listed as follows:   History of cardiac murmur  History of anxiety  History of hypertension  History of depression  Orders: Orders Placed This Encounter  Procedures  . Large Joint Inj   No orders of the defined types were placed in this encounter.    Follow-Up Instructions: Return in about 6 months (around 11/28/2019) for Fibromyalgia, Osteoarthritis.   Ofilia Neas, PA-C   I examined and evaluated the patient with Hazel Sams PA.  She had tenderness on palpation of bilateral trochanteric bursa consistent with trochanteric bursitis on my exam.  Per her request bilateral trochanteric bursa were injected with cortisone as described above.  She tolerated the procedure well.  The plan of care was discussed as noted above.  Bo Merino, MD   Note - This record has been created using Editor, commissioning.  Chart creation errors have been sought, but may not always  have been located. Such creation errors do not  reflect on  the standard of medical care.

## 2019-05-30 ENCOUNTER — Ambulatory Visit: Payer: Medicare Other

## 2019-05-30 ENCOUNTER — Other Ambulatory Visit: Payer: Self-pay | Admitting: Urology

## 2019-06-14 ENCOUNTER — Other Ambulatory Visit: Payer: Self-pay | Admitting: Family Medicine

## 2019-06-14 DIAGNOSIS — F3160 Bipolar disorder, current episode mixed, unspecified: Secondary | ICD-10-CM

## 2019-06-14 NOTE — Telephone Encounter (Signed)
Requested Prescriptions  Pending Prescriptions Disp Refills  . venlafaxine XR (EFFEXOR-XR) 75 MG 24 hr capsule [Pharmacy Med Name: VENLAFAXINE  CAP 75MG  ER] 90 capsule 0    Sig: TAKE 1 CAPSULE DAILY WITH  BREAKFAST     Psychiatry: Antidepressants - SNRI - desvenlafaxine & venlafaxine Failed - 06/14/2019  8:15 PM      Failed - Triglycerides in normal range and within 360 days    Triglycerides  Date Value Ref Range Status  05/05/2019 332 (H) <150 mg/dL Final    Comment:    . If a non-fasting specimen was collected, consider repeat triglyceride testing on a fasting specimen if clinically indicated.  Yates Decamp et al. J. of Clin. Lipidol. [id number]. [name]          Passed - LDL in normal range and within 360 days    LDL Cholesterol (Calc)  Date Value Ref Range Status  05/05/2019 91 mg/dL (calc) Final    Comment:    Reference range: <100 . Desirable range <100 mg/dL for primary prevention;   <70 mg/dL for patients with CHD or diabetic patients  with > or = 2 CHD risk factors. [name] LDL-C is now calculated using the Martin-Hopkins  calculation, which is a validated novel method providing  better accuracy than the Friedewald equation in the  estimation of LDL-C.  Cresenciano Genre et al. Annamaria Helling. [id number]): 2061-2068  (http://education.[facility].com/faq/FAQ164)          Passed - Total Cholesterol in normal range and within 360 days    Cholesterol  Date Value Ref Range Status  05/05/2019 192 <200 mg/dL Final         Passed - Completed PHQ-2 or PHQ-9 in the last 360 days.      Passed - Last BP in normal range    BP Readings from Last 1 Encounters:  05/29/19 133/70         Passed - Valid encounter within last 6 months    Recent Outpatient Visits          1 month ago Dyslipidemia   [facility] [name], MD   3 months ago [facility] [name], MD   4 months ago COPD with asthma [facility]   [facility] [name], MD   5 months ago History of syncope   [facility] [name], MD   7 months ago Mass of left axilla   [facility] [name], MD      Future Appointments            In 2 months Ancil Boozer, Drue Stager, MD [facility] [place]   In 3 months [place] [facility]   In 3 months  [place]   In 5 months [place], Gordan Payment [facility]           Triglycerides were non-fasting.  Mail order pharmacy request.

## 2019-06-18 ENCOUNTER — Telehealth: Payer: Self-pay

## 2019-06-18 NOTE — Telephone Encounter (Signed)
Copied from [place] [id number]. Topic: General - Inquiry >> Jun 17, 2019  3:07 PM Virl Axe D wrote: Reason for CRM: [facility] stated pt is having a tooth extracted on 07/08/19. Stated pt is on a baby aspirin regimen and they need to know if she needs to come off of aspirin prior to procedure and if so how long prior to procedure should she stop aspirin? Also when she should restart medication. Fax [phone] Please advise.

## 2019-06-24 ENCOUNTER — Other Ambulatory Visit: Payer: Self-pay | Admitting: Rheumatology

## 2019-06-24 DIAGNOSIS — M797 Fibromyalgia: Secondary | ICD-10-CM

## 2019-06-25 ENCOUNTER — Inpatient Hospital Stay: Payer: Medicare Other | Attending: Oncology

## 2019-06-25 ENCOUNTER — Other Ambulatory Visit: Payer: Self-pay

## 2019-06-25 DIAGNOSIS — D509 Iron deficiency anemia, unspecified: Secondary | ICD-10-CM | POA: Insufficient documentation

## 2019-06-25 DIAGNOSIS — D500 Iron deficiency anemia secondary to blood loss (chronic): Secondary | ICD-10-CM

## 2019-06-25 LAB — CBC WITH DIFFERENTIAL/PLATELET
Abs Immature Granulocytes: 0.03 10*3/uL (ref 0.00–0.07)
Basophils Absolute: 0 10*3/uL (ref 0.0–0.1)
Basophils Relative: 1 %
Eosinophils Absolute: 0.4 10*3/uL (ref 0.0–0.5)
Eosinophils Relative: 6 %
HCT: 39.7 % (ref 36.0–46.0)
Hemoglobin: 12.9 g/dL (ref 12.0–15.0)
Immature Granulocytes: 0 %
Lymphocytes Relative: 30 %
Lymphs Abs: 2.1 10*3/uL (ref 0.7–4.0)
MCH: 28.7 pg (ref 26.0–34.0)
MCHC: 32.5 g/dL (ref 30.0–36.0)
MCV: 88.2 fL (ref 80.0–100.0)
Monocytes Absolute: 0.7 10*3/uL (ref 0.1–1.0)
Monocytes Relative: 10 %
Neutro Abs: 3.6 10*3/uL (ref 1.7–7.7)
Neutrophils Relative %: 53 %
Platelets: 222 10*3/uL (ref 150–400)
RBC: 4.5 MIL/uL (ref 3.87–5.11)
RDW: 17.2 % — ABNORMAL HIGH (ref 11.5–15.5)
WBC: 6.8 10*3/uL (ref 4.0–10.5)
nRBC: 0 % (ref 0.0–0.2)

## 2019-06-25 LAB — IRON AND TIBC
Iron: 46 ug/dL (ref 28–170)
Saturation Ratios: 14 % (ref 10.4–31.8)
TIBC: 342 ug/dL (ref 250–450)
UIBC: 296 ug/dL

## 2019-06-25 LAB — FERRITIN: Ferritin: 21 ng/mL (ref 11–307)

## 2019-06-25 NOTE — Telephone Encounter (Signed)
Last Visit: 05/29/2019 Next Visit: 09/17/2019  Okay to refill gabapentin, 2 capsules three times daily?

## 2019-06-25 NOTE — Telephone Encounter (Signed)
ok 

## 2019-06-27 ENCOUNTER — Encounter: Payer: Self-pay | Admitting: Oncology

## 2019-06-27 ENCOUNTER — Ambulatory Visit: Payer: Medicare Other

## 2019-06-27 ENCOUNTER — Inpatient Hospital Stay (HOSPITAL_BASED_OUTPATIENT_CLINIC_OR_DEPARTMENT_OTHER): Payer: Medicare Other | Admitting: Oncology

## 2019-06-27 ENCOUNTER — Other Ambulatory Visit: Payer: Self-pay

## 2019-06-27 VITALS — BP 122/74 | HR 90 | Temp 94.8°F | Resp 18 | Wt 201.4 lb

## 2019-06-27 DIAGNOSIS — D500 Iron deficiency anemia secondary to blood loss (chronic): Secondary | ICD-10-CM

## 2019-06-27 DIAGNOSIS — D509 Iron deficiency anemia, unspecified: Secondary | ICD-10-CM | POA: Diagnosis not present

## 2019-06-27 NOTE — Progress Notes (Signed)
Hematology/Oncology  note [facility] Telephone:([phone]) [phone] Fax:(336) [phone]   Patient Care Team: [name], MD as PCP - General (Family Medicine) Bary Castilla, Forest Gleason, MD (General Surgery) Bo Merino, MD as Consulting Physician (Rheumatology) Providence Lanius, MD as Referring Physician (Neurology) Yolonda Kida, MD as Consulting Physician (Cardiology) Laneta Simmers as Physician Assistant (Urology) Vladimir Crofts, MD as Consulting Physician (Neurology) Marlou Sa Tonna Corner, MD as Consulting Physician (Orthopedic Surgery) Clyde Canterbury, MD as Consulting Physician (Otolaryngology) Gabriel Carina Betsey Holiday, MD as Physician Assistant (Endocrinology) Earlie Server, MD as Consulting Physician (Oncology)  REFERRING PROVIDER: [name], MD CHIEF COMPLAINTS/REASON FOR VISIT:   iron deficiency anemia  HISTORY OF PRESENTING ILLNESS:  Jacqueline Lucas is a  69 y.o.  female with PMH listed below was seen in consultation at the request of [name], MD   for evaluation of iron deficiency anemia.   Reviewed patient's recent labs  05/05/2019 labs revealed anemia with hemoglobin of 11.6, iron panel showed TIBC 350, iron saturation 13, ferritin 11. Reviewed patient's previous labs ordered by primary care physician's office, iron deficiency is chronic onset , duration is since August 2020. No aggravating or improving factors.  Associated signs and symptoms: Patient reports fatigue.  Denies SOB with exertion.  Denies weight loss, easy bruising, hematochezia, hemoptysis, hematuria. Context:  History of iron deficiency: Denies Rectal bleeding: Denies Menstrual bleeding/ Vaginal bleeding : Denies Hematemesis or hemoptysis : denies Blood in urine : denies   She is up to date with upper endoscopy, colonoscopy and capsule study. Capsule study showed 1 area of multiple petechia which could possibly be a source of intermittent bleeding.  INTERVAL  HISTORY Jacqueline Lucas is a 69 y.o. female who has above history reviewed by me today presents for follow up visit for management of iron deficiency anemia. Problems and complaints are listed below: Patient declined IV Venofer treatments.  She preferred to try oral iron supplementation. Today she feels that her fatigue level has slightly improved.  No new complaints.   Review of Systems  Constitutional: Negative for appetite change, chills, fatigue and fever.  HENT:   Negative for hearing loss and voice change.   Eyes: Negative for eye problems.  Respiratory: Negative for chest tightness and cough.   Cardiovascular: Negative for chest pain.  Gastrointestinal: Negative for abdominal distention, abdominal pain and blood in stool.  Endocrine: Negative for hot flashes.  Genitourinary: Negative for difficulty urinating and frequency.   Musculoskeletal: Negative for arthralgias.  Skin: Negative for itching and rash.  Neurological: Negative for extremity weakness.       Forgetful  Hematological: Negative for adenopathy.  Psychiatric/Behavioral: Negative for confusion.    MEDICAL HISTORY:  Past Medical History:  Diagnosis Date  . Allergy   . Allergy to adhesive tape    Allergy to paper tape as well  . Alzheimer disease ([place])   . Anemia   . Anginal pain ([place])   . Anxiety   . Arthritis   . Asthma   . Bronchitis   . Cataract   . Chronic nausea   . Claustrophobia   . DDD (degenerative disc disease), lumbar   . Depression   . Dry eyes    uses Restasis  . Fibromyalgia   . GERD (gastroesophageal reflux disease)   . H/O bladder infections   . Heart murmur   . Hematuria   . Hyperlipidemia   . Hypertension   . Hypothyroidism   . IBS (irritable bowel syndrome)   .  IDA (iron deficiency anemia) 05/13/2019  . Insomnia   . Lumbar neuritis   . Migraines   . Obesity   . Osteoporosis   . Palpitation   . RLS (restless legs syndrome)   . Shoulder fracture, right   . Sicca ([place])    . Tachycardia   . Thyroid disease   . Wears dentures    partial lower    SURGICAL HISTORY: Past Surgical History:  Procedure Laterality Date  . ABDOMINAL HYSTERECTOMY     due to cancer of ovaries  . BACK SURGERY N/A 2014   x 5  . BACK SURGERY  12/2017  . CARDIAC CATHETERIZATION     no PCI; 12/18/12 ([facility], Dr. Marcello Moores): EP study with ablation. No coronary angiography done then.  [name] CATARACT EXTRACTION W/PHACO Right 04/01/2019   Procedure: CATARACT EXTRACTION PHACO AND INTRAOCULAR LENS PLACEMENT (IOC) RIGHT  TORIC 6.19 00:40.1;  Surgeon: Birder Robson, MD;  Location: [place];  Service: Ophthalmology;  Laterality: Right;  . CATARACT EXTRACTION W/PHACO Left 04/29/2019   Procedure: CATARACT EXTRACTION PHACO AND INTRAOCULAR LENS PLACEMENT (IOC)  LEFT TORIC LENS 4.52  00:29.1;  Surgeon: Birder Robson, MD;  Location: [place];  Service: Ophthalmology;  Laterality: Left;  . COLONOSCOPY W/ POLYPECTOMY    . COLONOSCOPY WITH PROPOFOL N/A 02/04/2016   Procedure: COLONOSCOPY WITH PROPOFOL;  Surgeon: Robert Bellow, MD;  [facility];  Service: Endoscopy;  Laterality: N/A;  . ESOPHAGOGASTRODUODENOSCOPY (EGD) WITH PROPOFOL N/A 02/04/2016   Procedure: ESOPHAGOGASTRODUODENOSCOPY (EGD) WITH PROPOFOL;  Surgeon: Robert Bellow, MD;  Location: [facility] ENDOSCOPY;  Service: Endoscopy;  Laterality: N/A;  . FINGER ARTHROSCOPY WITH CARPOMETACARPEL ([place]) ARTHROPLASTY Left 05/01/2016   Dr. Erlinda Hong  . GIVENS CAPSULE STUDY N/A 05/02/2016   Procedure: GIVENS CAPSULE STUDY;  Surgeon: Manya Silvas, MD;  Location[facility] ENDOSCOPY;  Service: Endoscopy;  Laterality: N/A;  . HAND SURGERY    . HEMORRHOID SURGERY    . HIP ARTHROPLASTY    . JOINT REPLACEMENT Bilateral    knee  . KNEE ARTHROPLASTY    . REPLACEMENT TOTAL KNEE Left   . SHOULDER ARTHROSCOPY Left 09/09/2018   Procedure: LEFT SHOULDER ARTHROSCOPY, BICEPS TENOTOMY;  Surgeon: Meredith Pel, MD;  Location: [place];  Service: Orthopedics;  Laterality: Left;  . SPINE SURGERY     x 2  . tendonititis     right elbow, right wrist  . TOTAL HIP ARTHROPLASTY Left 2013  . TOTAL HIP ARTHROPLASTY Right 08/16/2015   Procedure: TOTAL HIP ARTHROPLASTY ANTERIOR APPROACH;  Surgeon: Meredith Pel, MD;  Location: [place];  Service: Orthopedics;  Laterality: Right;  . TOTAL KNEE ARTHROPLASTY Right 03/16/2015   Procedure: RIGHT TOTAL KNEE ARTHROPLASTY, PCL SACRIFICING;  Surgeon: Meredith Pel, MD;  Location: [place];  Service: Orthopedics;  Laterality: Right;    SOCIAL HISTORY: Social History   Socioeconomic History  . Marital status: Widowed    Spouse name: Fritz Pickerel  . Number of children: 4  . Years of education: some college  . Highest education level: 12th grade  Occupational History  . Occupation: Retired  Tobacco Use  . Smoking status: Never Smoker  . Smokeless tobacco: Never Used  . Tobacco comment: smoking cessation materials not required  Substance and Sexual Activity  . Alcohol use: No    Alcohol/week: 0.0 standard drinks  . Drug use: No  . Sexual activity: Never  Other Topics Concern  . Not on file  Social History Narrative  Patient has 10 grandchildren, 4 great-granddaughters & 2 great-grandsons.   Widow since 02/2018 - married for 42 years   Social Determinants of Radio broadcast assistant Strain:   . Difficulty of Paying Living Expenses:   Food Insecurity:   . Worried About Charity fundraiser in the Last Year:   . Arboriculturist in the Last Year:   Transportation Needs:   . Film/video editor (Medical):   [name] Lack of Transportation (Non-Medical):   Physical Activity:   . Days of Exercise per Week:   . Minutes of Exercise per Session:   Stress: Stress Concern Present  . Feeling of Stress : To some extent  Social Connections:   . Frequency of Communication with Friends and Family:   . Frequency of Social Gatherings with Friends and Family:   . Attends  Religious Services:   . Active Member of Clubs or Organizations:   . Attends Archivist Meetings:   [name] Marital Status:   Intimate Partner Violence:   . Fear of Current or Ex-Partner:   . Emotionally Abused:   [name] Physically Abused:   . Sexually Abused:     FAMILY HISTORY: Family History  Problem Relation Age of Onset  . Diabetes Mother   . Hypertension Mother   . Heart disease Mother   . Alzheimer's disease Mother   . Diabetes Father   . Throat cancer Father   . Hypertension Sister   . Depression Sister   . Fibromyalgia Sister   . Cancer Brother   . Heart disease Brother   . Stroke Son   . Fibromyalgia Daughter   . Bladder Cancer Neg Hx   . Kidney disease Neg Hx   . Kidney cancer Neg Hx     ALLERGIES:  is allergic to morphine; lyrica [pregabalin]; metformin and related; diazepam; latex; rash away  [petrolatum-zinc oxide]; salicylates; and tape.  MEDICATIONS:  Current Outpatient Medications  Medication Sig Dispense Refill  . aspirin 81 MG chewable tablet Chew 81 mg by mouth daily.    [name] atorvastatin (LIPITOR) 40 MG tablet Take 1 tablet (40 mg total) by mouth daily at 6 PM. 90 tablet 1  . Cholecalciferol (D3-1000) 1000 units tablet Take 1,000 Units by mouth at bedtime.     . diclofenac sodium (VOLTAREN) 1 % GEL Apply 2 g topically 4 (four) times daily. 150 g 2  . DULERA 200-5 MCG/ACT AERO USE 2 INHALATIONS ORALLY   TWICE DAILY 39 g 0  . estradiol (ESTRACE) 0.1 MG/GM vaginal cream APPLY A PEA SIZED AMOUNT JUST INSIDE THE VAGINAL INTROITUS WITH A FINGER TIP EVERY MONDAY, WEDNESDAY, AND FRIDAY NIGHTS 42.5 g 3  . ferrous sulfate 325 (65 FE) MG tablet Take 325 mg by mouth 2 (two) times daily with a meal.    . gabapentin (NEURONTIN) 300 MG capsule TAKE 2 CAPSULES 3 TIMES A  DAY 540 capsule 0  . hydrOXYzine (VISTARIL) 25 MG capsule TAKE 1 CAPSULE 3 TIMES A   DAY AS NEEDED FOR ANXIETY 90 capsule 0  . lansoprazole (PREVACID) 30 MG capsule Take 1 capsule (30 mg total) daily by  mouth. (Patient taking differently: Take 30 mg by mouth 2 (two) times daily before a meal. ) 90 capsule 1  . levalbuterol (XOPENEX) 1.25 MG/3ML nebulizer solution Take 1.25 mg by nebulization every 4 (four) hours as needed for wheezing. 72 mL 0  . levothyroxine (SYNTHROID, LEVOTHROID) 50 MCG tablet Take 50 mcg by mouth daily before breakfast.    .  LINZESS 145 MCG CAPS capsule TAKE 1 CAPSULE DAILY BEFOREBREAKFAST 90 capsule 1  . MAGNESIUM OXIDE PO Take 400 mg by mouth at bedtime. Reported on 07/28/2015    . methocarbamol (ROBAXIN) 500 MG tablet TAKE 1 TABLET BY MOUTH EVERY 8 HOURS AS NEEDED 30 tablet 0  . montelukast (SINGULAIR) 10 MG tablet TAKE 1 TABLET AT BEDTIME 90 tablet 1  . nystatin (MYCOSTATIN) 100000 UNIT/ML suspension TAKE 5 MLS (500,000 UNITS TOTAL) BY MOUTH 4 (FOUR) TIMES DAILY. 480 mL 0  . olmesartan-hydrochlorothiazide (BENICAR HCT) 40-12.5 MG tablet Take 0.5 tablets by mouth daily. 90 tablet 1  . omega-3 acid ethyl esters (LOVAZA) 1 g capsule Take 2 capsules (2 g total) by mouth 2 (two) times daily. 360 capsule 1  . ondansetron (ZOFRAN) 4 MG tablet Take 1 tablet (4 mg total) by mouth every 8 (eight) hours as needed for nausea or vomiting. 20 tablet 1  . oxyCODONE (OXY IR/ROXICODONE) 5 MG immediate release tablet Take 5 mg by mouth every 8 (eight) hours as needed.    . RESTASIS 0.05 % ophthalmic emulsion Place 1 drop into both eyes 2 (two) times daily.     . valACYclovir (VALTREX) 1000 MG tablet Take 1 tablet (1,000 mg total) by mouth 2 (two) times daily. 20 tablet 2  . venlafaxine XR (EFFEXOR-XR) 75 MG 24 hr capsule TAKE 1 CAPSULE DAILY WITH  BREAKFAST 90 capsule 1  . vitamin B-12 (CYANOCOBALAMIN) 1000 MCG tablet Take 1,000 mcg by mouth at bedtime.    . divalproex (DEPAKOTE) 250 MG DR tablet TAKE 1 TABLET TWICE A DAY 180 tablet 1   No current facility-administered medications for this visit.     PHYSICAL EXAMINATION: ECOG PERFORMANCE STATUS: 1 - Symptomatic but completely  ambulatory Vitals:   06/27/19 1348  BP: 122/74  Pulse: 90  Resp: 18  Temp: (!) 94.8 F (34.9 C)   Filed Weights   06/27/19 1348  Weight: 201 lb 6.4 oz (91.4 kg)    Physical Exam Constitutional:      General: She is not in acute distress. HENT:     Head: Normocephalic and atraumatic.  Eyes:     General: No scleral icterus. Cardiovascular:     Rate and Rhythm: Normal rate and regular rhythm.     Heart sounds: Normal heart sounds.  Pulmonary:     Effort: Pulmonary effort is normal. No respiratory distress.     Breath sounds: No wheezing.  Abdominal:     General: Bowel sounds are normal. There is no distension.     Palpations: Abdomen is soft.  Musculoskeletal:        General: No deformity. Normal range of motion.     Cervical back: Normal range of motion and neck supple.  Skin:    General: Skin is warm and dry.     Findings: No erythema or rash.  Neurological:     Mental Status: She is alert and oriented to person, place, and time. Mental status is at baseline.     Cranial Nerves: No cranial nerve deficit.     Coordination: Coordination normal.  Psychiatric:        Mood and Affect: Mood normal.       CMP Latest Ref Rng & Units 05/05/2019  Glucose 65 - 139 mg/dL 151(H)  BUN 7 - 25 mg/dL 11  Creatinine 0.50 - 0.99 mg/dL 0.74  Sodium 135 - 146 mmol/L 140  Potassium 3.5 - 5.3 mmol/L 4.7  Chloride 98 - 110 mmol/L 104  CO2 20 - 32 mmol/L 29  Calcium 8.6 - 10.4 mg/dL 9.1  Total Protein 6.1 - 8.1 g/dL 6.2  Total Bilirubin 0.2 - 1.2 mg/dL 0.3  Alkaline Phos 33 - 130 U/L -  AST 10 - 35 U/L 12  ALT 6 - 29 U/L 8   CBC Latest Ref Rng & Units 06/25/2019  WBC 4.0 - 10.5 K/uL 6.8  Hemoglobin 12.0 - 15.0 g/dL 12.9  Hematocrit 36.0 - 46.0 % 39.7  Platelets 150 - 400 K/uL 222     LABORATORY DATA:  I have reviewed the data as listed Lab Results  Component Value Date   WBC 6.8 06/25/2019   HGB 12.9 06/25/2019   HCT 39.7 06/25/2019   MCV 88.2 06/25/2019   PLT 222  06/25/2019   Recent Labs    09/06/18 1430 10/28/18 0000 12/11/18 1419 03/26/19 1326 05/05/19 1202  NA  --  139 136  --  140  K  --  4.2 4.2  --  4.7  CL  --  103 101  --  104  CO2  --  29 25  --  29  GLUCOSE  --  117* 108*  --  151*  BUN  --  11 18  --  11  CREATININE   < > 0.82 0.88 0.80 0.74  CALCIUM  --  9.4 9.1  --  9.1  GFRNONAA  --  73 >60  --  83  GFRAA  --  85 >60  --  96  PROT  --  6.4  --   --  6.2  AST  --  11  --   --  12  ALT  --  9  --   --  8  BILITOT  --  0.3  --   --  0.3   < > = values in this interval not displayed.   Iron/TIBC/Ferritin/ %Sat    Component Value Date/Time   IRON 46 06/25/2019 1108   IRON 65 06/08/2016 1522   TIBC 342 06/25/2019 1108   TIBC 308 06/08/2016 1522   FERRITIN 21 06/25/2019 1108   FERRITIN 33 06/08/2016 1522   IRONPCTSAT 14 06/25/2019 1108   IRONPCTSAT 13 (L) 05/05/2019 1202     RADIOGRAPHIC STUDIES: I have personally reviewed the radiological images as listed and agreed with the findings in the report. No results found.     ASSESSMENT & PLAN:  1. Iron deficiency anemia due to chronic blood loss   Labs reviewed and discussed with patient. Hemoglobin has improved as well as iron panel. I recommend patient to continue take oral iron supplementation ferrous sulfate 325 mg twice daily. Hold off IV Venofer treatments.   All questions were answered. The patient knows to call the clinic with any problems questions or concerns.  Return of visit: 6 months  Earlie Server, MD, PhD Hematology Oncology [place] at [facility] 06/27/2019

## 2019-06-27 NOTE — Progress Notes (Signed)
Patient does not offer any problems today.  

## 2019-07-01 ENCOUNTER — Other Ambulatory Visit: Payer: Self-pay | Admitting: Family Medicine

## 2019-07-01 DIAGNOSIS — J302 Other seasonal allergic rhinitis: Secondary | ICD-10-CM

## 2019-07-01 NOTE — Telephone Encounter (Signed)
Requested Prescriptions  Pending Prescriptions Disp Refills  . montelukast (SINGULAIR) 10 MG tablet [Pharmacy Med Name: MONTELUKAST  TAB 10MG ] 90 tablet 1    Sig: TAKE 1 TABLET AT BEDTIME     Pulmonology:  Leukotriene Inhibitors Passed - 07/01/2019 11:03 PM      Passed - Valid encounter within last 12 months    Recent Outpatient Visits          1 month ago Dyslipidemia   [facility] [name], MD   3 months ago [facility] [name], MD   5 months ago COPD with asthma [facility])   [facility] [name], MD   6 months ago History of syncope   [facility] [name], MD   8 months ago Mass of left axilla   [facility] [name], MD      Future Appointments            In 1 month Ancil Boozer, Drue Stager, MD [facility], [place]   In 2 months [place] Skye [facility] [place]   In 2 months  [facility], [place]   In 4 months McGowan, Gordan Payment [place]   In 5 months Earlie Server, MD [facility]

## 2019-07-06 ENCOUNTER — Other Ambulatory Visit: Payer: Self-pay | Admitting: Family Medicine

## 2019-07-06 DIAGNOSIS — E785 Hyperlipidemia, unspecified: Secondary | ICD-10-CM

## 2019-07-15 ENCOUNTER — Other Ambulatory Visit: Payer: Self-pay | Admitting: Family Medicine

## 2019-07-15 DIAGNOSIS — G43109 Migraine with aura, not intractable, without status migrainosus: Secondary | ICD-10-CM

## 2019-07-15 DIAGNOSIS — I10 Essential (primary) hypertension: Secondary | ICD-10-CM

## 2019-07-15 NOTE — Telephone Encounter (Signed)
Requested medications are due for refill today?  Unknown - Medication discontinued by Hazel Sams, PA-C on 05/29/2019.    Requested medications are on active medication list?  No  Last Refill:   Per medication history 04/21/2019  # 180 with no refills.    Future visit scheduled?  Yes  Notes to Clinic:  Hazel Sams, PA-C discontinued metoprolol on 05/29/2019 during patient's visit to [facility]. There is a notation of ERROR as the reason.  In looking at the medication history, it appears patient has been on this medication for some time.  Please review.

## 2019-07-21 ENCOUNTER — Other Ambulatory Visit: Payer: Self-pay | Admitting: Family Medicine

## 2019-07-21 DIAGNOSIS — F410 Panic disorder [episodic paroxysmal anxiety] without agoraphobia: Secondary | ICD-10-CM

## 2019-07-21 NOTE — Telephone Encounter (Signed)
Requested Prescriptions  Pending Prescriptions Disp Refills  . hydrOXYzine (VISTARIL) 25 MG capsule [Pharmacy Med Name: HYDROXYZ PAM CAP 25MG ] 90 capsule 1    Sig: TAKE 1 CAPSULE 3 TIMES A   DAY AS NEEDED FOR ANXIETY     Ear, Nose, and Throat:  Antihistamines Passed - 07/21/2019  4:02 PM      Passed - Valid encounter within last 12 months    Recent Outpatient Visits          2 months ago Dyslipidemia   [facility] [name], MD   4 months ago [facility] [name], MD   5 months ago COPD with asthma [facility])   [facility] [name], MD   7 months ago History of syncope   [facility] [name], MD   8 months ago Mass of left axilla   [facility] [name], MD      Future Appointments            In 3 weeks [name], MD [facility], [place]   In 1 month [place] Skye [facility] [place]   In 2 months  [facility], [place]   In 4 months McGowan, Gordan Payment [place]   In 5 months Earlie Server, MD [place] [facility]

## 2019-07-24 ENCOUNTER — Telehealth: Payer: Self-pay

## 2019-07-24 NOTE — Telephone Encounter (Signed)
Patient requests refill of Robaxin

## 2019-07-25 ENCOUNTER — Other Ambulatory Visit: Payer: Self-pay | Admitting: Surgical

## 2019-07-25 MED ORDER — METHOCARBAMOL 500 MG PO TABS: 500.0000 mg | ORAL_TABLET | Freq: Three times a day (TID) | ORAL | 0 refills | Status: DC | PRN

## 2019-07-25 NOTE — Telephone Encounter (Signed)
submitted

## 2019-07-31 ENCOUNTER — Ambulatory Visit (INDEPENDENT_AMBULATORY_CARE_PROVIDER_SITE_OTHER): Payer: Medicare Other | Admitting: Internal Medicine

## 2019-07-31 ENCOUNTER — Encounter: Payer: Self-pay | Admitting: Internal Medicine

## 2019-07-31 ENCOUNTER — Ambulatory Visit: Payer: Medicare Other | Admitting: Orthopedic Surgery

## 2019-07-31 VITALS — Ht 64.0 in | Wt 201.0 lb

## 2019-07-31 DIAGNOSIS — J4531 Mild persistent asthma with (acute) exacerbation: Secondary | ICD-10-CM

## 2019-07-31 DIAGNOSIS — J069 Acute upper respiratory infection, unspecified: Secondary | ICD-10-CM

## 2019-07-31 MED ORDER — AZITHROMYCIN 250 MG PO TABS: ORAL_TABLET | ORAL | 0 refills | Status: DC

## 2019-07-31 NOTE — Progress Notes (Signed)
Name: Jacqueline Lucas   MRN: [id number]    DOB: 09/09/1950   Date:07/31/2019       Progress Note  Subjective  Chief Complaint  Chief Complaint  Patient presents with  . Sore Throat    started about a week ago  . Ear Pain    both ears are sore, left ear is worse than right  . Cough    I connected with  [name] on 07/31/19 at  8:00 AM EDT by telephone and verified that I am speaking with the correct person using two identifiers.  I discussed the limitations, risks, security and privacy concerns of performing an evaluation and management service by telephone and the availability of in person appointments. Staff also discussed with the patient that there may be a patient responsible charge related to this service. Patient Location: Home Provider Location: [employer] Additional Individuals present: none  HPI Patient is a 69 year old female patient of Dr. Ancil Boozer Patient around great grandchildren, and they are now ill, not Covid and were tested She presents today with complaints of sore throat, ear pain and cough  Symptoms getting bad last 3-4 days, started 1 to 2 weeks ago. Left ear hurting and sore throat, coughing up stuff - occas discolored Yesterday morning had breathing treatments with nebulizer at home, used three times yesterday and helped, also has inhaler uses twice a day - dulera Not sleep real well last night No marked SOB No fever, not feeling feverish + sore throat, not see any white spots +mild congestion, sinus and more at night with left side worse than right No loss of smell, loss of taste No  N/V No bad muscle aches No marked loose stools/diarrhea No CP, confusion, passing out episodes  Comorbid conditions reviewed + asthma hx,  No h/o DM,  No smoking history nor concerning alcohol use in her past  Not have Covid vaccine Lives by self now   Patient Active Problem List   Diagnosis Date Noted  . IDA (iron deficiency anemia) 05/13/2019  .  Superior glenoid labrum lesion of left shoulder   . Lumbar stenosis 02/04/2018  . Hx of hysterectomy 08/13/2017  . Pain in left wrist 06/12/2017  . Chronic venous insufficiency 06/09/2017  . Varicose veins of both lower extremities with pain 04/30/2017  . Claudication ([place]) 04/30/2017  . Arthritis of carpometacarpal [facility]) joint of left thumb 05/12/2016  . Bilateral thumb pain 05/01/2016  . Anemia 02/03/2016  . Status post bilateral total hip replacement 01/10/2016  . Sicca ([place]) 01/10/2016  . Age related osteoporosis 01/10/2016  . Hip arthritis 08/16/2015  . Microscopic hematuria 07/30/2015  . Vaginal atrophy 07/30/2015  . GAD (generalized anxiety disorder) 07/08/2015  . Moderate episode of recurrent major depressive disorder ([place]) 07/08/2015  . Migraine with aura and without status migrainosus, not intractable 07/08/2015  . Dyslipidemia 07/08/2015  . Degenerative arthritis of right knee 03/16/2015  . Atrophic vaginitis 01/31/2015  . Recurrent UTI 12/31/2014  . IBS (irritable bowel syndrome) 10/29/2014  . Asthma, moderate persistent 09/08/2014  . Allergic state 09/07/2014  . Colon polyp 09/07/2014  . Hemorrhoid 09/07/2014  . Constrictive tenosynovitis 03/12/2014  . H/O total knee replacement, bilateral 07/31/2013  . CAD (coronary artery disease) 11/28/2012  . Anxiety 11/28/2012  . Acid reflux 11/28/2012  . Cardiac murmur 11/28/2012  . BP (high blood pressure) 11/28/2012  . Cannot sleep 11/28/2012  . Adiposity 11/28/2012  . Restless leg 11/28/2012  . Supraventricular tachycardia ([place]) 11/28/2012  . Fibromyalgia 11/28/2012  .  Tachycardia 11/28/2012    Past Surgical History:  Procedure Laterality Date  . ABDOMINAL HYSTERECTOMY     due to cancer of ovaries  . BACK SURGERY N/A 2014   x 5  . BACK SURGERY  12/2017  . CARDIAC CATHETERIZATION     no PCI; 12/18/12 ([facility], Dr. Marcello Moores): EP study with ablation. No coronary angiography done then.  [name] CATARACT EXTRACTION W/PHACO  Right 04/01/2019   Procedure: CATARACT EXTRACTION PHACO AND INTRAOCULAR LENS PLACEMENT (IOC) RIGHT  TORIC 6.19 00:40.1;  Surgeon: Birder Robson, MD;  Location: [place];  Service: Ophthalmology;  Laterality: Right;  . CATARACT EXTRACTION W/PHACO Left 04/29/2019   Procedure: CATARACT EXTRACTION PHACO AND INTRAOCULAR LENS PLACEMENT (IOC)  LEFT TORIC LENS 4.52  00:29.1;  Surgeon: Birder Robson, MD;  Location: [place];  Service: Ophthalmology;  Laterality: Left;  . COLONOSCOPY W/ POLYPECTOMY    . COLONOSCOPY WITH PROPOFOL N/A 02/04/2016   Procedure: COLONOSCOPY WITH PROPOFOL;  Surgeon: Robert Bellow, MD;  Location: [facility] ENDOSCOPY;  Service: Endoscopy;  Laterality: N/A;  . ESOPHAGOGASTRODUODENOSCOPY (EGD) WITH PROPOFOL N/A 02/04/2016   Procedure: ESOPHAGOGASTRODUODENOSCOPY (EGD) WITH PROPOFOL;  Surgeon: Robert Bellow, MD;  Location: [facility] ENDOSCOPY;  Service: Endoscopy;  Laterality: N/A;  . FINGER ARTHROSCOPY WITH CARPOMETACARPEL (No Name) ARTHROPLASTY Left 05/01/2016   Dr. Erlinda Hong  . GIVENS CAPSULE STUDY N/A 05/02/2016   Procedure: GIVENS CAPSULE STUDY;  Surgeon: Manya Silvas, MD;  Location: [facility] ENDOSCOPY;  Service: Endoscopy;  Laterality: N/A;  . HAND SURGERY    . HEMORRHOID SURGERY    . HIP ARTHROPLASTY    . JOINT REPLACEMENT Bilateral    knee  . KNEE ARTHROPLASTY    . REPLACEMENT TOTAL KNEE Left   . SHOULDER ARTHROSCOPY Left 09/09/2018   Procedure: LEFT SHOULDER ARTHROSCOPY, BICEPS TENOTOMY;  Surgeon: Meredith Pel, MD;  Location: [place];  Service: Orthopedics;  Laterality: Left;  . SPINE SURGERY     x 2  . tendonititis     right elbow, right wrist  . TOTAL HIP ARTHROPLASTY Left 2013  . TOTAL HIP ARTHROPLASTY Right 08/16/2015   Procedure: TOTAL HIP ARTHROPLASTY ANTERIOR APPROACH;  Surgeon: Meredith Pel, MD;  Location: [place];  Service: Orthopedics;  Laterality: Right;  . TOTAL KNEE ARTHROPLASTY Right 03/16/2015   Procedure: RIGHT TOTAL  KNEE ARTHROPLASTY, PCL SACRIFICING;  Surgeon: Meredith Pel, MD;  Location: [place];  Service: Orthopedics;  Laterality: Right;    Family History  Problem Relation Age of Onset  . Diabetes Mother   . Hypertension Mother   . Heart disease Mother   . Alzheimer's disease Mother   . Diabetes Father   . Throat cancer Father   . Hypertension Sister   . Depression Sister   . Fibromyalgia Sister   . Cancer Brother   . Heart disease Brother   . Stroke Son   . Fibromyalgia Daughter   . Bladder Cancer Neg Hx   . Kidney disease Neg Hx   . Kidney cancer Neg Hx     Social History   Tobacco Use  . Smoking status: Never Smoker  . Smokeless tobacco: Never Used  . Tobacco comment: smoking cessation materials not required  Substance Use Topics  . Alcohol use: No    Alcohol/week: 0.0 standard drinks     Current Outpatient Medications:  .  aspirin 81 MG chewable tablet, Chew 81 mg by mouth daily., Disp: , Rfl:  .  atorvastatin (LIPITOR) 40 MG tablet, Take 1  tablet (40 mg total) by mouth daily at 6 PM., Disp: 90 tablet, Rfl: 1 .  Cholecalciferol (D3-1000) 1000 units tablet, Take 1,000 Units by mouth at bedtime. , Disp: , Rfl:  .  diclofenac sodium (VOLTAREN) 1 % GEL, Apply 2 g topically 4 (four) times daily., Disp: 150 g, Rfl: 2 .  divalproex (DEPAKOTE) 250 MG DR tablet, TAKE 1 TABLET TWICE A DAY, Disp: 180 tablet, Rfl: 1 .  DULERA 200-5 MCG/ACT AERO, USE 2 INHALATIONS ORALLY   TWICE DAILY, Disp: 39 g, Rfl: 0 .  estradiol (ESTRACE) 0.1 MG/GM vaginal cream, APPLY A PEA SIZED AMOUNT JUST INSIDE THE VAGINAL INTROITUS WITH A FINGER TIP EVERY MONDAY, WEDNESDAY, AND FRIDAY NIGHTS, Disp: 42.5 g, Rfl: 3 .  ferrous sulfate 325 (65 FE) MG tablet, Take 325 mg by mouth 2 (two) times daily with a meal., Disp: , Rfl:  .  gabapentin (NEURONTIN) 300 MG capsule, TAKE 2 CAPSULES 3 TIMES A  DAY, Disp: 540 capsule, Rfl: 0 .  hydrOXYzine (VISTARIL) 25 MG capsule, TAKE 1 CAPSULE 3 TIMES A   DAY AS NEEDED FOR  ANXIETY, Disp: 90 capsule, Rfl: 1 .  lansoprazole (PREVACID) 30 MG capsule, Take 1 capsule (30 mg total) daily by mouth. (Patient taking differently: Take 30 mg by mouth 2 (two) times daily before a meal. ), Disp: 90 capsule, Rfl: 1 .  levalbuterol (XOPENEX) 1.25 MG/3ML nebulizer solution, Take 1.25 mg by nebulization every 4 (four) hours as needed for wheezing., Disp: 72 mL, Rfl: 0 .  levothyroxine (SYNTHROID, LEVOTHROID) 50 MCG tablet, Take 50 mcg by mouth daily before breakfast., Disp: , Rfl:  .  LINZESS 145 MCG CAPS capsule, TAKE 1 CAPSULE DAILY BEFOREBREAKFAST, Disp: 90 capsule, Rfl: 1 .  MAGNESIUM OXIDE PO, Take 400 mg by mouth at bedtime. Reported on 07/28/2015, Disp: , Rfl:  .  methocarbamol (ROBAXIN) 500 MG tablet, Take 1 tablet (500 mg total) by mouth every 8 (eight) hours as needed., Disp: 30 tablet, Rfl: 0 .  montelukast (SINGULAIR) 10 MG tablet, TAKE 1 TABLET AT BEDTIME, Disp: 90 tablet, Rfl: 3 .  nystatin (MYCOSTATIN) 100000 UNIT/ML suspension, TAKE 5 MLS (500,000 UNITS TOTAL) BY MOUTH 4 (FOUR) TIMES DAILY., Disp: 480 mL, Rfl: 0 .  olmesartan-hydrochlorothiazide (BENICAR HCT) 40-12.5 MG tablet, Take 0.5 tablets by mouth daily., Disp: 90 tablet, Rfl: 1 .  omega-3 acid ethyl esters (LOVAZA) 1 g capsule, TAKE 2 CAPSULES TWICE DAILY, Disp: 360 capsule, Rfl: 1 .  ondansetron (ZOFRAN) 4 MG tablet, Take 1 tablet (4 mg total) by mouth every 8 (eight) hours as needed for nausea or vomiting., Disp: 20 tablet, Rfl: 1 .  oxyCODONE (OXY IR/ROXICODONE) 5 MG immediate release tablet, Take 5 mg by mouth every 8 (eight) hours as needed., Disp: , Rfl:  .  RESTASIS 0.05 % ophthalmic emulsion, Place 1 drop into both eyes 2 (two) times daily. , Disp: , Rfl:  .  valACYclovir (VALTREX) 1000 MG tablet, Take 1 tablet (1,000 mg total) by mouth 2 (two) times daily., Disp: 20 tablet, Rfl: 2 .  venlafaxine XR (EFFEXOR-XR) 75 MG 24 hr capsule, TAKE 1 CAPSULE DAILY WITH  BREAKFAST, Disp: 90 capsule, Rfl: 1 .  vitamin  B-12 (CYANOCOBALAMIN) 1000 MCG tablet, Take 1,000 mcg by mouth at bedtime., Disp: , Rfl:   Allergies  Allergen Reactions  . Morphine Nausea And Vomiting    Ok with nausea medication  . Lyrica [Pregabalin] Other (See Comments)    Joint swelling  . Metformin And Related  diarrhea  . Diazepam Nausea And Vomiting    Can take with anti-nausea medication  . Latex Rash    Bandaid glue  . Rash Away  [Petrolatum-Zinc Oxide] Rash and Swelling  . Salicylates Other (See Comments)    Other reaction(s): Headache  . Tape Rash    Please use paper tape    With staff assistance, above reviewed with the patient today.  ROS: As per HPI, otherwise no specific complaints on a limited and focused system review   Objective  Virtual encounter, vitals not obtained.  Body mass index is 34.5 kg/m.  Physical Exam   Appears in NAD via conversation, pleasant Breathing: No obvious respiratory distress. Speaking in complete sentences Neurological: Pt is alert and oriented, Speech is normal Psychiatric: Patient has a normal mood and affect, Judgment and thought content normal.   No results found for this or any previous visit (from the past 72 hour(s)).  PHQ2/9: Depression screen [facility] 2/9 07/31/2019 05/05/2019 03/14/2019 01/27/2019 12/23/2018  Decreased Interest 0 0 0 0 2  Down, Depressed, Hopeless 0 1 0 0 2  PHQ - 2 Score 0 1 0 0 4  Altered sleeping 1 3 3 2 3   Tired, decreased energy 0 2 3 1 2   Change in appetite 0 1 1 0 1  Feeling bad or failure about yourself  0 0 0 0 0  Trouble concentrating 0 0 0 0 1  Moving slowly or fidgety/restless 0 0 0 0 1  Suicidal thoughts 0 0 0 0 0  PHQ-9 Score 1 7 7 3 12   Difficult doing work/chores Not difficult at all Not difficult at all Somewhat difficult Not difficult at all Somewhat difficult  Some recent data might be hidden   PHQ-2/9 Result reviewed  Fall Risk: Fall Risk  07/31/2019 05/05/2019 03/14/2019 01/27/2019 12/26/2018  Falls in the past year? 1 0 1 1  0  Number falls in past yr: 0 0 0 0 -  Injury with Fall? 0 0 0 0 -  Risk Factor Category  - - - - -  Risk for fall due to : - - - - -  Risk for fall due to: Comment - - - - -  Follow up - - - - -     Assessment & Plan  1. Mild persistent asthmatic bronchitis with acute exacerbation Educated, noted could be viral source but concerned with clinical presentation, Felt best to add an abx, with a Z-Pak added and to take as directed Symptomatic measures with OTC expectorant with dextromethorphan (DM) rec'ed (like a mucinex DM or robitussin DM product) Did not feel adding a steroid was needed presently, as she noted with her nebulizer treatments at home and her chronic inhaler she uses twice daily routinely, the risk/benefit of the steroid favors holding off on an oral steroid presently He can continue the nebulizer treatments 2-3 times daily as needed in the short-term presently acetaminophen products prn also recommend Rest and increased fluids Discussed getting a Covid test, and did not feel unreasonable to wait 24 to 48 hours and if symptoms improving, then not having to get a Covid test, although if not, strongly encouraged her to get a Covid test, especially noting that she has not had the vaccine. Also recommended at when symptoms improving, to get the vaccine.  She states she plans to do so. She lives by herself, and will stay relatively isolated until symptoms improved. - azithromycin (ZITHROMAX) 250 MG tablet; Take as directed (Take 2 tabs  on day 1, then one tab daily until finished)  Dispense: 6 tablet; Refill: 0  2. Upper respiratory tract infection, unspecified type As above.  Do feel infection is the source of her asthmatic bronchitis exacerbation. - azithromycin (ZITHROMAX) 250 MG tablet; Take as directed (Take 2 tabs on day 1, then one tab daily until finished)  Dispense: 6 tablet; Refill: 0  Emphasized following up if not improving or worsening as well.   I discussed the  assessment and treatment plan with the patient. The patient was provided an opportunity to ask questions and all were answered. The patient agreed with the plan and demonstrated an understanding of the instructions.  Red flags and when to present for emergency care or RTC including fever, chest pain, worsening shortness of breath, new/worsening/un-resolving symptoms reviewed with patient at time of visit.   The patient was advised to call back or seek an in-person evaluation if the symptoms worsen or if the condition fails to improve as anticipated.  I provided 15 minutes of non-face-to-face time during this encounter that included discussing at length patient's sx/history, pertinent pmhx, medications, treatment and follow up plan. This time also included the necessary documentation, orders, and chart review.  Towanda Malkin, MD

## 2019-08-06 ENCOUNTER — Other Ambulatory Visit: Payer: Self-pay

## 2019-08-06 ENCOUNTER — Ambulatory Visit (INDEPENDENT_AMBULATORY_CARE_PROVIDER_SITE_OTHER): Payer: Medicare Other | Admitting: Orthopedic Surgery

## 2019-08-06 DIAGNOSIS — M6530 Trigger finger, unspecified finger: Secondary | ICD-10-CM | POA: Diagnosis not present

## 2019-08-06 DIAGNOSIS — R200 Anesthesia of skin: Secondary | ICD-10-CM

## 2019-08-06 DIAGNOSIS — M7542 Impingement syndrome of left shoulder: Secondary | ICD-10-CM

## 2019-08-08 ENCOUNTER — Telehealth: Payer: Self-pay

## 2019-08-08 ENCOUNTER — Encounter: Payer: Self-pay | Admitting: Orthopedic Surgery

## 2019-08-08 DIAGNOSIS — M7542 Impingement syndrome of left shoulder: Secondary | ICD-10-CM | POA: Diagnosis not present

## 2019-08-08 MED ORDER — LIDOCAINE HCL 1 % IJ SOLN
5.0000 mL | INTRAMUSCULAR | Status: AC | PRN
  Administered 2019-08-08: 5 mL

## 2019-08-08 MED ORDER — BUPIVACAINE HCL 0.5 % IJ SOLN
9.0000 mL | INTRAMUSCULAR | Status: AC | PRN
  Administered 2019-08-08: 9 mL via INTRA_ARTICULAR

## 2019-08-08 MED ORDER — METHYLPREDNISOLONE ACETATE 40 MG/ML IJ SUSP
40.0000 mg | INTRAMUSCULAR | Status: AC | PRN
  Administered 2019-08-08: 40 mg via INTRA_ARTICULAR

## 2019-08-08 NOTE — Telephone Encounter (Signed)
Dr Marlou Sa would like to change referral from [facility] to [facility]. Can you please correct this?

## 2019-08-08 NOTE — Telephone Encounter (Signed)
Updated note in referral and appt notes.

## 2019-08-08 NOTE — Progress Notes (Signed)
Office Visit Note   Patient: Jacqueline Lucas           Date of Birth: November 18, 1950           MRN: [id number] Visit Date: 08/06/2019 Requested by: [name], [address] [place],  [place] [address] PCP: [name], MD  Subjective: Chief Complaint  Patient presents with  . Hand Pain    HPI: Jacqueline Lucas is a patient with multiple orthopedic complaints today.  She describes trigger finger ring in the left ring and middle finger which is constant and occurs on a daily basis.  Worse in the morning.  Patient also reports right wrist pain.  She has de Quervain's tenosynovitis as well as possible carpal tunnel syndrome on that side.  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  Patient also reports bilateral shoulder pain with left shoulder impingement symptoms.              ROS: All systems reviewed are negative as they relate to the chief complaint within the history of present illness.  Patient denies  fevers or chills.   Assessment & Plan: Visit Diagnoses:  1. Numbness of left hand   2. Impingement syndrome of left shoulder   3. Stenosing tenosynovitis of finger     Plan: Impression is right hand possible carpal tunnel syndrome.  She also has right wrist de Quervain's tenosynovitis and CMC arthritis.  Would like to get nerve conduction of the right upper extremity to evaluate carpal tunnel syndrome.  Also she would like to get the left ring and middle finger released.  I think that is reasonable based on the fact that she is moving in several months.  We discussed injections today but she wants to avoid that and proceed with surgical intervention.  Regarding the shoulder she has impingement symptoms and we will inject the subacromial space today.  See her back 10 days after surgery and will see her also after nerve conduction on that right hand.  Follow-Up Instructions: No follow-ups on file.   Orders:    Orders Placed This Encounter  Procedures  . Ambulatory referral to Physical Medicine Rehab   No orders of the defined types were placed in this encounter.     Procedures: Large Joint Inj: L subacromial bursa on 08/08/2019 8:26 AM Indications: diagnostic evaluation and pain Details: 18 G 1.5 in needle, posterior approach  Arthrogram: No  Medications: 9 mL bupivacaine 0.5 %; 40 mg methylPREDNISolone acetate 40 MG/ML; 5 mL lidocaine 1 % Outcome: tolerated well, no immediate complications Procedure, treatment alternatives, risks and benefits explained, specific risks discussed. Consent was given by the patient. Immediately prior to procedure a time out was called to verify the correct patient, procedure, equipment, support staff and site/side marked as required. Patient was prepped and draped in the usual sterile fashion.       Clinical Data: No additional findings.  Objective: Vital Signs: There were no vitals taken for this visit.  Physical Exam:   Constitutional: Patient appears well-developed HEENT:  Head: Normocephalic Eyes:EOM are normal Neck: Normal range of motion Cardiovascular: Normal rate Pulmonary/chest: Effort normal Neurologic: Patient is alert Skin: Skin is warm Psychiatric: Patient has normal mood and affect    Ortho Exam: Ortho exam demonstrates tenderness of the A1 pulley left ring and middle finger.  Locking is present.  EPL FPL interosseous strength is intact radial pulses intact bilaterally.  Negative Tinel's cubital tunnel on the left the right-hand side.  Patient does have some tenderness at the [employer] joint on the right.  Some tenderness over the first dorsal compartment on the right.  Positive carpal tunnel compression testing on the right.  Positive impingement signs bilateral shoulders worse on the left compared to the right.  No restriction of external rotation of 15 degrees of abduction.  Specialty Comments:  No specialty comments  available.  Imaging: No results found.   PMFS History: Patient Active Problem List   Diagnosis Date Noted  . IDA (iron deficiency anemia) 05/13/2019  . Superior glenoid labrum lesion of left shoulder   . Lumbar stenosis 02/04/2018  . Hx of hysterectomy 08/13/2017  . Pain in left wrist 06/12/2017  . Chronic venous insufficiency 06/09/2017  . Varicose veins of both lower extremities with pain 04/30/2017  . Claudication ([place]) 04/30/2017  . Arthritis of carpometacarpal [facility]) joint of left thumb 05/12/2016  . Bilateral thumb pain 05/01/2016  . Anemia 02/03/2016  . Status post bilateral total hip replacement 01/10/2016  . Sicca ([place]) 01/10/2016  . Age related osteoporosis 01/10/2016  . Hip arthritis 08/16/2015  . Microscopic hematuria 07/30/2015  . Vaginal atrophy 07/30/2015  . GAD (generalized anxiety disorder) 07/08/2015  . Moderate episode of recurrent major depressive disorder ([place]) 07/08/2015  . Migraine with aura and without status migrainosus, not intractable 07/08/2015  . Dyslipidemia 07/08/2015  . Degenerative arthritis of right knee 03/16/2015  . Atrophic vaginitis 01/31/2015  . Recurrent UTI 12/31/2014  . IBS (irritable bowel syndrome) 10/29/2014  . Asthma, moderate persistent 09/08/2014  . Allergic state 09/07/2014  . Colon polyp 09/07/2014  . Hemorrhoid 09/07/2014  . Constrictive tenosynovitis 03/12/2014  . H/O total knee replacement, bilateral 07/31/2013  . CAD (coronary artery disease) 11/28/2012  . Anxiety 11/28/2012  . Acid reflux 11/28/2012  . Cardiac murmur 11/28/2012  . BP (high blood pressure) 11/28/2012  . Cannot sleep 11/28/2012  . Adiposity 11/28/2012  . Restless leg 11/28/2012  . Supraventricular tachycardia ([place]) 11/28/2012  . Fibromyalgia 11/28/2012  . Tachycardia 11/28/2012   Past Medical History:  Diagnosis Date  . Allergy   . Allergy to adhesive tape    Allergy to paper tape as well  . Alzheimer disease ([place])   . Anemia   . Anginal  pain ([place])   . Anxiety   . Arthritis   . Asthma   . Bronchitis   . Cataract   . Chronic nausea   . Claustrophobia   . DDD (degenerative disc disease), lumbar   . Depression   . Dry eyes    uses Restasis  . Fibromyalgia   . GERD (gastroesophageal reflux disease)   . H/O bladder infections   . Heart murmur   . Hematuria   . Hyperlipidemia   . Hypertension   . Hypothyroidism   . IBS (irritable bowel syndrome)   . IDA (iron deficiency anemia) 05/13/2019  . Insomnia   . Lumbar neuritis   . Migraines   . Obesity   . Osteoporosis   . Palpitation   . RLS (restless legs syndrome)   . Shoulder fracture, right   . Sicca ([place])   . Tachycardia   . Thyroid disease   . Wears dentures    partial lower    Family History  Problem Relation Age of Onset  . Diabetes Mother   . Hypertension Mother   . Heart disease Mother   . Alzheimer's disease Mother   . Diabetes Father   .  Throat cancer Father   . Hypertension Sister   . Depression Sister   . Fibromyalgia Sister   . Cancer Brother   . Heart disease Brother   . Stroke Son   . Fibromyalgia Daughter   . Bladder Cancer Neg Hx   . Kidney disease Neg Hx   . Kidney cancer Neg Hx     Past Surgical History:  Procedure Laterality Date  . ABDOMINAL HYSTERECTOMY     due to cancer of ovaries  . BACK SURGERY N/A 2014   x 5  . BACK SURGERY  12/2017  . CARDIAC CATHETERIZATION     no PCI; 12/18/12 ([facility], Dr. Marcello Moores): EP study with ablation. No coronary angiography done then.  [name] CATARACT EXTRACTION W/PHACO Right 04/01/2019   Procedure: CATARACT EXTRACTION PHACO AND INTRAOCULAR LENS PLACEMENT (IOC) RIGHT  TORIC 6.19 00:40.1;  Surgeon: Birder Robson, MD;  Location: [facility];  Service: Ophthalmology;  Laterality: Right;  . CATARACT EXTRACTION W/PHACO Left 04/29/2019   Procedure: CATARACT EXTRACTION PHACO AND INTRAOCULAR LENS PLACEMENT (IOC)  LEFT TORIC LENS 4.52  00:29.1;  Surgeon: Birder Robson, MD;  Location: [facility];  Service: Ophthalmology;  Laterality: Left;  . COLONOSCOPY W/ POLYPECTOMY    . COLONOSCOPY WITH PROPOFOL N/A 02/04/2016   Procedure: COLONOSCOPY WITH PROPOFOL;  Surgeon: Robert Bellow, MD;  Location: [facility] ENDOSCOPY;  Service: Endoscopy;  Laterality: N/A;  . ESOPHAGOGASTRODUODENOSCOPY (EGD) WITH PROPOFOL N/A 02/04/2016   Procedure: ESOPHAGOGASTRODUODENOSCOPY (EGD) WITH PROPOFOL;  Surgeon: Robert Bellow, MD;  Location: [facility] ENDOSCOPY;  Service: Endoscopy;  Laterality: N/A;  . FINGER ARTHROSCOPY WITH CARPOMETACARPEL ([place]) ARTHROPLASTY Left 05/01/2016   Dr. Erlinda Hong  . GIVENS CAPSULE STUDY N/A 05/02/2016   Procedure: GIVENS CAPSULE STUDY;  Surgeon: Manya Silvas, MD;  Location: [facility] ENDOSCOPY;  Service: Endoscopy;  Laterality: N/A;  . HAND SURGERY    . HEMORRHOID SURGERY    . HIP ARTHROPLASTY    . JOINT REPLACEMENT Bilateral    knee  . KNEE ARTHROPLASTY    . REPLACEMENT TOTAL KNEE Left   . SHOULDER ARTHROSCOPY Left 09/09/2018   Procedure: LEFT SHOULDER ARTHROSCOPY, BICEPS TENOTOMY;  Surgeon: Meredith Pel, MD;  Location: [facility];  Service: Orthopedics;  Laterality: Left;  . SPINE SURGERY     x 2  . tendonititis     right elbow, right wrist  . TOTAL HIP ARTHROPLASTY Left 2013  . TOTAL HIP ARTHROPLASTY Right 08/16/2015   Procedure: TOTAL HIP ARTHROPLASTY ANTERIOR APPROACH;  Surgeon: Meredith Pel, MD;  Location: [facility];  Service: Orthopedics;  Laterality: Right;  . TOTAL KNEE ARTHROPLASTY Right 03/16/2015   Procedure: RIGHT TOTAL KNEE ARTHROPLASTY, PCL SACRIFICING;  Surgeon: Meredith Pel, MD;  Location: [facility];  Service: Orthopedics;  Laterality: Right;   Social History   Occupational History  . Occupation: Retired  Tobacco Use  . Smoking status: Never Smoker  . Smokeless tobacco: Never Used  . Tobacco comment: smoking cessation materials not required  Vaping Use  . Vaping Use: Never used  Substance and Sexual Activity  . Alcohol use: No     Alcohol/week: 0.0 standard drinks  . Drug use: No  . Sexual activity: Never

## 2019-08-12 ENCOUNTER — Ambulatory Visit: Payer: Medicare Other | Admitting: Family Medicine

## 2019-08-15 ENCOUNTER — Ambulatory Visit: Payer: Medicare Other | Admitting: Family Medicine

## 2019-08-18 ENCOUNTER — Other Ambulatory Visit: Payer: Self-pay | Admitting: Surgical

## 2019-08-18 DIAGNOSIS — M1811 Unilateral primary osteoarthritis of first carpometacarpal joint, right hand: Secondary | ICD-10-CM

## 2019-08-18 DIAGNOSIS — M65342 Trigger finger, left ring finger: Secondary | ICD-10-CM

## 2019-08-18 DIAGNOSIS — M65332 Trigger finger, left middle finger: Secondary | ICD-10-CM

## 2019-08-18 HISTORY — PX: TRIGGER FINGER RELEASE: SHX641

## 2019-08-18 MED ORDER — METHOCARBAMOL 500 MG PO TABS: 500.0000 mg | ORAL_TABLET | Freq: Three times a day (TID) | ORAL | 0 refills | Status: AC | PRN

## 2019-08-18 MED ORDER — TRAMADOL HCL 50 MG PO TABS: 50.0000 mg | ORAL_TABLET | Freq: Four times a day (QID) | ORAL | 0 refills | Status: AC | PRN

## 2019-08-22 ENCOUNTER — Encounter: Payer: Self-pay | Admitting: Family Medicine

## 2019-08-22 ENCOUNTER — Other Ambulatory Visit: Payer: Self-pay | Admitting: Family Medicine

## 2019-08-22 ENCOUNTER — Other Ambulatory Visit: Payer: Self-pay

## 2019-08-22 ENCOUNTER — Ambulatory Visit (INDEPENDENT_AMBULATORY_CARE_PROVIDER_SITE_OTHER): Payer: Medicare Other | Admitting: Family Medicine

## 2019-08-22 VITALS — BP 130/60 | HR 85 | Temp 96.9°F | Resp 16 | Ht 64.0 in | Wt 205.3 lb

## 2019-08-22 DIAGNOSIS — E785 Hyperlipidemia, unspecified: Secondary | ICD-10-CM

## 2019-08-22 DIAGNOSIS — D509 Iron deficiency anemia, unspecified: Secondary | ICD-10-CM

## 2019-08-22 DIAGNOSIS — R7303 Prediabetes: Secondary | ICD-10-CM

## 2019-08-22 DIAGNOSIS — J449 Chronic obstructive pulmonary disease, unspecified: Secondary | ICD-10-CM | POA: Diagnosis not present

## 2019-08-22 DIAGNOSIS — F3160 Bipolar disorder, current episode mixed, unspecified: Secondary | ICD-10-CM

## 2019-08-22 DIAGNOSIS — I10 Essential (primary) hypertension: Secondary | ICD-10-CM

## 2019-08-22 DIAGNOSIS — I209 Angina pectoris, unspecified: Secondary | ICD-10-CM

## 2019-08-22 DIAGNOSIS — I471 Supraventricular tachycardia, unspecified: Secondary | ICD-10-CM

## 2019-08-22 DIAGNOSIS — J4489 Other specified chronic obstructive pulmonary disease: Secondary | ICD-10-CM

## 2019-08-22 DIAGNOSIS — G43109 Migraine with aura, not intractable, without status migrainosus: Secondary | ICD-10-CM

## 2019-08-22 DIAGNOSIS — M797 Fibromyalgia: Secondary | ICD-10-CM

## 2019-08-22 DIAGNOSIS — B370 Candidal stomatitis: Secondary | ICD-10-CM

## 2019-08-22 DIAGNOSIS — E039 Hypothyroidism, unspecified: Secondary | ICD-10-CM

## 2019-08-22 MED ORDER — VENLAFAXINE HCL ER 37.5 MG PO CP24: 37.5000 mg | ORAL_CAPSULE | Freq: Every day | ORAL | 0 refills | Status: AC

## 2019-08-22 MED ORDER — DULERA 200-5 MCG/ACT IN AERO: 2.0000 | INHALATION_SPRAY | Freq: Two times a day (BID) | RESPIRATORY_TRACT | 1 refills | Status: AC

## 2019-08-22 MED ORDER — LEVALBUTEROL HCL 1.25 MG/3ML IN NEBU: 1.2500 mg | INHALATION_SOLUTION | RESPIRATORY_TRACT | 0 refills | Status: DC | PRN

## 2019-08-22 NOTE — Progress Notes (Signed)
Name: Jacqueline Lucas   MRN: [id number]    DOB: 11-14-1950   Date:08/22/2019       Progress Note  Subjective  Chief Complaint  Chief Complaint  Patient presents with  . Dyslipidemia  . Fibromyalgia  . COPD  . Asthma    HPI  HTN/SVTshe is takingBenicar - 40-12.5, mteoprolol,and is doing well,no chest pain or palpitation.Continue medication   Bipolar disorder: she is doing well on Depakote and Effexor, denies mania,but she statescontinues to have episodes of not sleeping for two or three days in a row, we will try addingSeroquel, that is chronicShe states she still taking hydroxyzine prn and seems to help with anxiety. Her children are very supportive, she is currently helping her 70 yo and sometimes her 25 yo grand-daughter She states she is not longer feeling depressed, she wants to  stop Effexor, worried about possible pancreatic cancer , we will decrease dose of Effexor to 37.5, she does not want to replaced with another medication at this time.   Chronic back pain: she has seen surgeon in [place], she is having more symptoms lately , she will schedule a follow up. She is having right outer hip pain and is causing numbness down right leg.   FMS: seeing Dr. Olegario Messier on muscle relaxer and gabapentin.Stable at this time  Dyslipidemia: currently on lipitor only, off Tricorand is now Lovaza, no side effects.Last level showed worsening of triglycerides, she is not fasting today and explained labs will not be as accurate. She has been taking medications are recommended now   Goiter: she was seen by Dr. Ezequiel Ganser the past, but switched to Dr. Gabriel Carina and was given reassurance, no need for biopsy at this time. Last TSH at goal   COPD/Asthma:from second hand smoking, taking Duleraand Singulair.Shestates she has occasional wheezing , she states since using neb machine more frequently her sob has improved.    Angina/ History of SVT: doing well, on metoprolol, statin,  and aspirin, sees Dr. Clayborn Bigness yearlybasis now.She denies any recent episodes of chest pain ( medically managed , no longer having palpitation   Obesity & Hyperglycemia: shehas hyperglycemia./insuilin resistance,last A1C was 6.3%, she is cutting on sweets, no longer drinking sodas, drinking mostly water or diluted sweet tea. She denies polyphagia, polydipsia or polyuria.    Constipation: seen by GI back in 2017 with same symptoms, colonoscopy was normal ,she is now on Linzess daily and is doing well, no blood in stools or straining lately, responding well to medication. She takes it daily and has a bowel movement daily   Pulsatile Tinnitus: seen by Dr. Tami Ribas , she had an US carotid done 01/01/2019 moderate calcified plaques , negative intracranial MRA, she states beeping sensation is stable. She states symptoms intermittent now  History of iron deficiency: unknown etiology, likely dietary deficiency, we will recheck her level next time, go down to one pill daily  Patient Active Problem List   Diagnosis Date Noted  . IDA (iron deficiency anemia) 05/13/2019  . Superior glenoid labrum lesion of left shoulder   . Lumbar stenosis 02/04/2018  . Hx of hysterectomy 08/13/2017  . Pain in left wrist 06/12/2017  . Chronic venous insufficiency 06/09/2017  . Varicose veins of both lower extremities with pain 04/30/2017  . Claudication ([place]) 04/30/2017  . Arthritis of carpometacarpal [facility]) joint of left thumb 05/12/2016  . Bilateral thumb pain 05/01/2016  . Anemia 02/03/2016  . Status post bilateral total hip replacement 01/10/2016  . Sicca ([place]) 01/10/2016  . Age  related osteoporosis 01/10/2016  . Hip arthritis 08/16/2015  . Microscopic hematuria 07/30/2015  . Vaginal atrophy 07/30/2015  . GAD (generalized anxiety disorder) 07/08/2015  . Moderate episode of recurrent major depressive disorder ([place]) 07/08/2015  . Migraine with aura and without status migrainosus, not intractable  07/08/2015  . Dyslipidemia 07/08/2015  . Degenerative arthritis of right knee 03/16/2015  . Atrophic vaginitis 01/31/2015  . Recurrent UTI 12/31/2014  . IBS (irritable bowel syndrome) 10/29/2014  . Asthma, moderate persistent 09/08/2014  . Allergic state 09/07/2014  . Colon polyp 09/07/2014  . Hemorrhoid 09/07/2014  . Constrictive tenosynovitis 03/12/2014  . H/O total knee replacement, bilateral 07/31/2013  . CAD (coronary artery disease) 11/28/2012  . Anxiety 11/28/2012  . Acid reflux 11/28/2012  . Cardiac murmur 11/28/2012  . BP (high blood pressure) 11/28/2012  . Cannot sleep 11/28/2012  . Adiposity 11/28/2012  . Restless leg 11/28/2012  . Supraventricular tachycardia ([place]) 11/28/2012  . Fibromyalgia 11/28/2012  . Tachycardia 11/28/2012    Past Surgical History:  Procedure Laterality Date  . ABDOMINAL HYSTERECTOMY     due to cancer of ovaries  . BACK SURGERY N/A 2014   x 5  . BACK SURGERY  12/2017  . CARDIAC CATHETERIZATION     no PCI; 12/18/12 ([facility], Dr. Marcello Moores): EP study with ablation. No coronary angiography done then.  [name] CATARACT EXTRACTION W/PHACO Right 04/01/2019   Procedure: CATARACT EXTRACTION PHACO AND INTRAOCULAR LENS PLACEMENT (IOC) RIGHT  TORIC 6.19 00:40.1;  Surgeon: Birder Robson, MD;  Location: [place];  Service: Ophthalmology;  Laterality: Right;  . CATARACT EXTRACTION W/PHACO Left 04/29/2019   Procedure: CATARACT EXTRACTION PHACO AND INTRAOCULAR LENS PLACEMENT (IOC)  LEFT TORIC LENS 4.52  00:29.1;  Surgeon: Birder Robson, MD;  Location: [place];  Service: Ophthalmology;  Laterality: Left;  . COLONOSCOPY W/ POLYPECTOMY    . COLONOSCOPY WITH PROPOFOL N/A 02/04/2016   Procedure: COLONOSCOPY WITH PROPOFOL;  Surgeon: Robert Bellow, MD;  Location: [facility] ENDOSCOPY;  Service: Endoscopy;  Laterality: N/A;  . ESOPHAGOGASTRODUODENOSCOPY (EGD) WITH PROPOFOL N/A 02/04/2016   Procedure: ESOPHAGOGASTRODUODENOSCOPY (EGD) WITH PROPOFOL;   Surgeon: Robert Bellow, MD;  Location: [facility] ENDOSCOPY;  Service: Endoscopy;  Laterality: N/A;  . FINGER ARTHROSCOPY WITH CARPOMETACARPEL ([place]) ARTHROPLASTY Left 05/01/2016   Dr. Erlinda Hong  . GIVENS CAPSULE STUDY N/A 05/02/2016   Procedure: GIVENS CAPSULE STUDY;  Surgeon: Manya Silvas, MD;  Location: [facility] ENDOSCOPY;  Service: Endoscopy;  Laterality: N/A;  . HAND SURGERY    . HEMORRHOID SURGERY    . HIP ARTHROPLASTY    . JOINT REPLACEMENT Bilateral    knee  . KNEE ARTHROPLASTY    . REPLACEMENT TOTAL KNEE Left   . SHOULDER ARTHROSCOPY Left 09/09/2018   Procedure: LEFT SHOULDER ARTHROSCOPY, BICEPS TENOTOMY;  Surgeon: Meredith Pel, MD;  Location: [place];  Service: Orthopedics;  Laterality: Left;  . SPINE SURGERY     x 2  . tendonititis     right elbow, right wrist  . TOTAL HIP ARTHROPLASTY Left 2013  . TOTAL HIP ARTHROPLASTY Right 08/16/2015   Procedure: TOTAL HIP ARTHROPLASTY ANTERIOR APPROACH;  Surgeon: Meredith Pel, MD;  Location: [place];  Service: Orthopedics;  Laterality: Right;  . TOTAL KNEE ARTHROPLASTY Right 03/16/2015   Procedure: RIGHT TOTAL KNEE ARTHROPLASTY, PCL SACRIFICING;  Surgeon: Meredith Pel, MD;  Location: [place];  Service: Orthopedics;  Laterality: Right;  . TRIGGER FINGER RELEASE  08/18/2019    Family History  Problem Relation Age of  Onset  . Diabetes Mother   . Hypertension Mother   . Heart disease Mother   . Alzheimer's disease Mother   . Diabetes Father   . Throat cancer Father   . Hypertension Sister   . Depression Sister   . Fibromyalgia Sister   . Cancer Brother   . Heart disease Brother   . Stroke Son   . Fibromyalgia Daughter   . Bladder Cancer Neg Hx   . Kidney disease Neg Hx   . Kidney cancer Neg Hx     Social History   Tobacco Use  . Smoking status: Never Smoker  . Smokeless tobacco: Never Used  . Tobacco comment: smoking cessation materials not required  Substance Use Topics  . Alcohol use: No     Alcohol/week: 0.0 standard drinks     Current Outpatient Medications:  .  aspirin 81 MG chewable tablet, Chew 81 mg by mouth daily., Disp: , Rfl:  .  atorvastatin (LIPITOR) 40 MG tablet, Take 1 tablet (40 mg total) by mouth daily at 6 PM., Disp: 90 tablet, Rfl: 1 .  Cholecalciferol (D3-1000) 1000 units tablet, Take 1,000 Units by mouth at bedtime. , Disp: , Rfl:  .  diclofenac sodium (VOLTAREN) 1 % GEL, Apply 2 g topically 4 (four) times daily., Disp: 150 g, Rfl: 2 .  divalproex (DEPAKOTE) 250 MG DR tablet, TAKE 1 TABLET TWICE A DAY, Disp: 180 tablet, Rfl: 1 .  DULERA 200-5 MCG/ACT AERO, USE 2 INHALATIONS ORALLY   TWICE DAILY, Disp: 39 g, Rfl: 0 .  estradiol (ESTRACE) 0.1 MG/GM vaginal cream, APPLY A PEA SIZED AMOUNT JUST INSIDE THE VAGINAL INTROITUS WITH A FINGER TIP EVERY MONDAY, WEDNESDAY, AND FRIDAY NIGHTS, Disp: 42.5 g, Rfl: 3 .  ferrous sulfate 325 (65 FE) MG tablet, Take 325 mg by mouth 2 (two) times daily with a meal., Disp: , Rfl:  .  gabapentin (NEURONTIN) 300 MG capsule, TAKE 2 CAPSULES 3 TIMES A  DAY, Disp: 540 capsule, Rfl: 0 .  hydrOXYzine (VISTARIL) 25 MG capsule, TAKE 1 CAPSULE 3 TIMES A   DAY AS NEEDED FOR ANXIETY, Disp: 90 capsule, Rfl: 1 .  levalbuterol (XOPENEX) 1.25 MG/3ML nebulizer solution, Take 1.25 mg by nebulization every 4 (four) hours as needed for wheezing., Disp: 72 mL, Rfl: 0 .  levothyroxine (SYNTHROID, LEVOTHROID) 50 MCG tablet, Take 50 mcg by mouth daily before breakfast., Disp: , Rfl:  .  LINZESS 145 MCG CAPS capsule, TAKE 1 CAPSULE DAILY BEFOREBREAKFAST, Disp: 90 capsule, Rfl: 1 .  MAGNESIUM OXIDE PO, Take 400 mg by mouth at bedtime. Reported on 07/28/2015, Disp: , Rfl:  .  methocarbamol (ROBAXIN) 500 MG tablet, Take 1 tablet (500 mg total) by mouth every 8 (eight) hours as needed., Disp: 30 tablet, Rfl: 0 .  montelukast (SINGULAIR) 10 MG tablet, TAKE 1 TABLET AT BEDTIME, Disp: 90 tablet, Rfl: 3 .  nystatin (MYCOSTATIN) 100000 UNIT/ML suspension, TAKE 5 MLS  (500,000 UNITS TOTAL) BY MOUTH 4 (FOUR) TIMES DAILY., Disp: 480 mL, Rfl: 0 .  olmesartan-hydrochlorothiazide (BENICAR HCT) 40-12.5 MG tablet, Take 0.5 tablets by mouth daily., Disp: 90 tablet, Rfl: 1 .  omega-3 acid ethyl esters (LOVAZA) 1 g capsule, TAKE 2 CAPSULES TWICE DAILY, Disp: 360 capsule, Rfl: 1 .  ondansetron (ZOFRAN) 4 MG tablet, Take 1 tablet (4 mg total) by mouth every 8 (eight) hours as needed for nausea or vomiting., Disp: 20 tablet, Rfl: 1 .  oxyCODONE (OXY IR/ROXICODONE) 5 MG immediate release tablet, Take 5 mg  by mouth every 8 (eight) hours as needed., Disp: , Rfl:  .  RESTASIS 0.05 % ophthalmic emulsion, Place 1 drop into both eyes 2 (two) times daily. , Disp: , Rfl:  .  traMADol (ULTRAM) 50 MG tablet, Take 1 tablet (50 mg total) by mouth every 6 (six) hours as needed., Disp: 20 tablet, Rfl: 0 .  valACYclovir (VALTREX) 1000 MG tablet, Take 1 tablet (1,000 mg total) by mouth 2 (two) times daily., Disp: 20 tablet, Rfl: 2 .  venlafaxine XR (EFFEXOR-XR) 75 MG 24 hr capsule, TAKE 1 CAPSULE DAILY WITH  BREAKFAST, Disp: 90 capsule, Rfl: 1 .  vitamin B-12 (CYANOCOBALAMIN) 1000 MCG tablet, Take 1,000 mcg by mouth at bedtime., Disp: , Rfl:  .  azithromycin (ZITHROMAX) 250 MG tablet, Take as directed (Take 2 tabs on day 1, then one tab daily until finished) (Patient not taking: Reported on 08/22/2019), Disp: 6 tablet, Rfl: 0 .  lansoprazole (PREVACID) 30 MG capsule, Take 1 capsule (30 mg total) daily by mouth. (Patient taking differently: Take 30 mg by mouth 2 (two) times daily before a meal. ), Disp: 90 capsule, Rfl: 1  Allergies  Allergen Reactions  . Morphine Nausea And Vomiting    Ok with nausea medication  . Lyrica [Pregabalin] Other (See Comments)    Joint swelling  . Metformin And Related     diarrhea  . Diazepam Nausea And Vomiting    Can take with anti-nausea medication  . Latex Rash    Bandaid glue  . Rash Away  [Petrolatum-Zinc Oxide] Rash and Swelling  . Salicylates Other  (See Comments)    Other reaction(s): Headache  . Tape Rash    Please use paper tape    I personally reviewed active problem list, medication list, allergies, family history, social history, health maintenance with the patient/caregiver today.   ROS  Constitutional: Negative for fever or weight change.  Respiratory: Negative for cough and shortness of breath.   Cardiovascular: Negative for chest pain or palpitations.  Gastrointestinal: Negative for abdominal pain, no bowel changes.  Musculoskeletal: Negative for gait problem or joint swelling.  Skin: Negative for rash.  Neurological: Negative for dizziness or headache.  No other specific complaints in a complete review of systems (except as listed in HPI above).  Objective  Vitals:   08/22/19 1117  BP: 130/60  Pulse: 85  Resp: 16  Temp: (!) 96.9 F (36.1 C)  TempSrc: Temporal  SpO2: 95%  Weight: 205 lb 4.8 oz (93.1 kg)  Height: 5\' 4"  (1.626 m)    Body mass index is 35.24 kg/m.  Physical Exam  Constitutional: Patient appears well-developed and well-nourished. Obese  No distress.  HEENT: head atraumatic, normocephalic, pupils equal and reactive to light,neck supple Cardiovascular: Normal rate, regular rhythm and normal heart sounds.  No murmur heard. No BLE edema. Pulmonary/Chest: Effort normal and breath sounds normal. No respiratory distress. Abdominal: Soft.  There is no tenderness. Psychiatric: Patient has a normal mood and affect. behavior is normal. Judgment and thought content normal Muscular skeletal: left wrist wrapped, recent trigger finger release surgery , pain during palpation of right lumbar spine.  Recent Results (from the past 2160 hour(s))  Iron and TIBC     Status: None   Collection Time: 06/25/19 11:08 AM  Result Value Ref Range   Iron 46 28 - 170 ug/dL   TIBC 342 250 - 450 ug/dL   Saturation Ratios 14 10.4 - 31.8 %   UIBC 296 ug/dL    Comment:  Performed at [facility], [address] [place], [place] [address]  Ferritin     Status: None   Collection Time: 06/25/19 11:08 AM  Result Value Ref Range   Ferritin 21 11 - 307 ng/mL    Comment: Performed at [facility], [address] [place], [place] [address]  CBC with Differential/Platelet     Status: Abnormal   Collection Time: 06/25/19 11:08 AM  Result Value Ref Range   WBC 6.8 4.0 - 10.5 K/uL   RBC 4.50 3.87 - 5.11 MIL/uL   Hemoglobin 12.9 12.0 - 15.0 g/dL   HCT 39.7 36 - 46 %   MCV 88.2 80.0 - 100.0 fL   MCH 28.7 26.0 - 34.0 pg   MCHC 32.5 30.0 - 36.0 g/dL   RDW 17.2 (H) 11.5 - 15.5 %   Platelets 222 150 - 400 K/uL   nRBC 0.0 0.0 - 0.2 %   Neutrophils Relative % 53 %   Neutro Abs 3.6 1.7 - 7.7 K/uL   Lymphocytes Relative 30 %   Lymphs Abs 2.1 0.7 - 4.0 K/uL   Monocytes Relative 10 %   Monocytes Absolute 0.7 0 - 1 K/uL   Eosinophils Relative 6 %   Eosinophils Absolute 0.4 0 - 0 K/uL   Basophils Relative 1 %   Basophils Absolute 0.0 0 - 0 K/uL   Immature Granulocytes 0 %   Abs Immature Granulocytes 0.03 0.00 - 0.07 K/uL    Comment: Performed at [facility], [address] [place], [place] [address]      PHQ2/9: Depression screen [facility] 2/9 08/22/2019 07/31/2019 05/05/2019 03/14/2019 01/27/2019  Decreased Interest 0 0 0 0 0  Down, Depressed, Hopeless 0 0 1 0 0  PHQ - 2 Score 0 0 1 0 0  Altered sleeping 3 1 3 3 2   Tired, decreased energy 1 0 2 3 1   Change in appetite 0 0 1 1 0  Feeling bad or failure about yourself  0 0 0 0 0  Trouble concentrating 0 0 0 0 0  Moving slowly or fidgety/restless 0 0 0 0 0  Suicidal thoughts 0 0 0 0 0  PHQ-9 Score 4 1 7 7 3   Difficult doing work/chores Not difficult at all Not difficult at all Not difficult at all Somewhat difficult Not difficult at all  Some recent data might be hidden    phq 9 is positive   Fall Risk: Fall Risk  07/31/2019 05/05/2019 03/14/2019 01/27/2019 12/26/2018  Falls in the past year? 1 0 1 1 0  Number falls in past yr: 0 0 0 0  -  Injury with Fall? 0 0 0 0 -  Risk Factor Category  - - - - -  Risk for fall due to : - - - - -  Risk for fall due to: Comment - - - - -  Follow up - - - - -     Functional Status Survey: Is the patient deaf or have difficulty hearing?: No Does the patient have difficulty seeing, even when wearing glasses/contacts?: No Does the patient have difficulty concentrating, remembering, or making decisions?: No Does the patient have difficulty walking or climbing stairs?: Yes Does the patient have difficulty dressing or bathing?: Yes Does the patient have difficulty doing errands alone such as visiting a doctor's office or shopping?: No    Assessment & Plan  1. COPD with asthma ([place])  - mometasone-formoterol (DULERA) 200-5 MCG/ACT AERO; Inhale 2  puffs into the lungs 2 (two) times daily.  Dispense: 39 g; Refill: 1 - levalbuterol (XOPENEX) 1.25 MG/3ML nebulizer solution; Take 1.25 mg by nebulization every 4 (four) hours as needed for wheezing.  Dispense: 72 mL; Refill: 0  2. Essential hypertension  At goal  3. Iron deficiency anemia, unspecified iron deficiency anemia type  Go down to one pill of ferrous sulfate daily   54. Bipolar 1 disorder, mixed (HCC)  - venlafaxine XR (EFFEXOR XR) 37.5 MG 24 hr capsule; Take 1 capsule (37.5 mg total) by mouth daily with breakfast.  Dispense: 30 capsule; Refill: 0  5. Asthma with COPD ([place])  Doing well on Dulera and tolerating Xopenex well.  6. Pre-diabetes  Discussed A1C trending up  7. Dyslipidemia  On statin therapy   8. Fibromyalgia  Stable   9. Adult hypothyroidism  Last TSH was normal, seeing Dr. Gabriel Carina   10. Migraine with aura and without status migrainosus, not intractable  No recent episodes   11. Anginal pain ([place])  On medical management   12. Supraventricular tachycardia ([place])  Doing well at this time, on metoprolol

## 2019-08-22 NOTE — Telephone Encounter (Signed)
Requested medication (s) are due for refill today:   No for all 3:   metoprolol 25 mg, atorvastatin 40 mg, Nystatin-no protocol  Requested medication (s) are on the active medication list:   Yes except metoprolol  Future visit scheduled:   Yes in 4 mo. With Dr. Ancil Boozer   Last ordered: metoprolol 25 mg discontinued 05/29/2019.                        Nystatin - no protocol assigned to this medication.                         Atorvastatin 40 mg 05/05/2019 with 1 refill being requested too soon.  Clinic note:  Returned for above reasons.         Requested Prescriptions  Pending Prescriptions Disp Refills   metoprolol tartrate (LOPRESSOR) 25 MG tablet [Pharmacy Med Name: METOPROLOL TARTRATE 25 MG TAB] 180 tablet 0    Sig: TAKE 1 TABLET BY MOUTH TWICE A DAY      Cardiovascular:  Beta Blockers Passed - 08/22/2019  1:42 PM      Passed - Last BP in normal range    BP Readings from Last 1 Encounters:  08/22/19 130/60          Passed - Last Heart Rate in normal range    Pulse Readings from Last 1 Encounters:  08/22/19 85          Passed - Valid encounter within last 6 months    Recent Outpatient Visits           Today Essential hypertension   [facility] [place], Drue Stager, MD   3 weeks ago Mild persistent asthmatic bronchitis with acute exacerbation   [place], MD   3 months ago Dyslipidemia   [facility] [name], MD   5 months ago [facility] [name], MD   6 months ago COPD with asthma [facility])   [facility] [name], MD       Future Appointments             In 5 days Marlou Sa, Tonna Corner, MD [facility]   In 3 weeks Su Monks [place] [facility]   In 1 month  [facility], [place]   In 2 months [place], Gordan Payment [place]   In 4 months  [name], MD [facility]              nystatin (MYCOSTATIN) 100000 UNIT/ML suspension [Pharmacy Med Name: NYSTATIN 100,000 UNIT/ML SUSP] 480 mL 0    Sig: TAKE 5 MLS (500,000 UNITS TOTAL) BY MOUTH 4 (FOUR) TIMES DAILY.      Off-Protocol Failed - 08/22/2019  1:42 PM      Failed - Medication not assigned to a protocol, review manually.      Passed - Valid encounter within last 12 months    Recent Outpatient Visits           Today Essential hypertension   [facility] [place], Drue Stager, MD   3 weeks ago Mild persistent asthmatic bronchitis with acute exacerbation   [facility] Tow[place] Malkin, MD   3 months ago Dyslipidemia   [facility] [name], MD   5 months ago Fibromyalgia  [facility] [name], MD   6 months ago COPD with asthma [facility])   [facility] [name], MD       Future Appointments             In 5 days Marlou Sa, Tonna Corner, MD [facility]   In 3 weeks Su Monks [place] [facility]   In 1 month  [facility], Acacia Villas   In 2 months McGowan, Gordan Payment [place]   In 4 months [name], MD [facility]              atorvastatin (LIPITOR) 40 MG tablet [Pharmacy Med Name: ATORVASTATIN 40 MG TABLET] 90 tablet 1    Sig: TAKE 1 TABLET BY MOUTH EVERYDAY AT BEDTIME      Cardiovascular:  Antilipid - Statins Failed - 08/22/2019  1:42 PM      Failed - Triglycerides in normal range and within 360 days    Triglycerides  Date Value Ref Range Status  05/05/2019 332 (H) <150 mg/dL Final    Comment:    . If a non-fasting specimen was collected, consider repeat triglyceride testing on a fasting specimen if clinically indicated.  Yates Decamp et al. J. of Clin. Lipidol. 8502;7:741-287. [name]           Passed - Total  Cholesterol in normal range and within 360 days    Cholesterol  Date Value Ref Range Status  05/05/2019 192 <200 mg/dL Final          Passed - LDL in normal range and within 360 days    LDL Cholesterol (Calc)  Date Value Ref Range Status  05/05/2019 91 mg/dL (calc) Final    Comment:    Reference range: <100 . Desirable range <100 mg/dL for primary prevention;   <70 mg/dL for patients with CHD or diabetic patients  with > or = 2 CHD risk factors. [name] LDL-C is now calculated using the Martin-Hopkins  calculation, which is a validated novel method providing  better accuracy than the Friedewald equation in the  estimation of LDL-C.  Cresenciano Genre et al. Annamaria Helling. 8676;720(94): 2061-2068  (http://education.[employer].com/faq/FAQ164)           Passed - HDL in normal range and within 360 days    HDL  Date Value Ref Range Status  05/05/2019 57 > OR = 50 mg/dL Final          Passed - Patient is not pregnant      Passed - Valid encounter within last 12 months    Recent Outpatient Visits           Today Essential hypertension   [facility], Drue Stager, MD   3 weeks ago Mild persistent asthmatic bronchitis with acute exacerbation   [place], MD   3 months ago Dyslipidemia   [facility] [name], MD   5 months ago [facility] [name], MD   6 months ago COPD with asthma [facility])   [facility] [name], MD       Future Appointments             In 5 days Marlou Sa, Tonna Corner, MD [facility]   In 3 weeks Su Monks [place] [facility]   In 1 month  [facility], [facility]  In 2 months McGowan, Gordan Payment [place]   In 4 months [name], MD [facility], [facility]

## 2019-08-25 ENCOUNTER — Telehealth: Payer: Self-pay | Admitting: Family Medicine

## 2019-08-25 NOTE — Telephone Encounter (Signed)
Patient called.  Patient aware.  

## 2019-08-25 NOTE — Telephone Encounter (Signed)
Pt  Would like to speak with Dr. Ancil Boozer about tapering off of divalproex (DEPAKOTE) 250 MG DR tablet /Pt would like this done before Dr. Ancil Boozer goes out on Vacation/ [place] advise

## 2019-08-27 ENCOUNTER — Telehealth: Payer: Self-pay

## 2019-08-27 ENCOUNTER — Ambulatory Visit (INDEPENDENT_AMBULATORY_CARE_PROVIDER_SITE_OTHER): Payer: Medicare Other | Admitting: Orthopedic Surgery

## 2019-08-27 DIAGNOSIS — M6530 Trigger finger, unspecified finger: Secondary | ICD-10-CM

## 2019-08-27 NOTE — Telephone Encounter (Signed)
Can you please call patient about referral, she was in office today and was asking about the status of  it. Looks like Dr Ernestina Patches ok'd it on 06/10. Thank you.

## 2019-08-28 ENCOUNTER — Telehealth: Payer: Self-pay | Admitting: *Deleted

## 2019-08-28 NOTE — Telephone Encounter (Signed)
Called pt and informed her appt is 09/26/19, pt understood.

## 2019-08-28 NOTE — Telephone Encounter (Signed)
Pt was scheduled for 09/26/19 on 08/08/19, I will call pt to remind patient of her appt.

## 2019-08-29 ENCOUNTER — Encounter: Payer: Self-pay | Admitting: Orthopedic Surgery

## 2019-08-29 ENCOUNTER — Telehealth: Payer: Self-pay | Admitting: Radiology

## 2019-08-29 NOTE — Progress Notes (Signed)
Post-Op Visit Note   Patient: Jacqueline Lucas           Date of Birth: 1950/12/12           MRN: [id number] Visit Date: 08/27/2019 PCP: [name], MD   Assessment & Plan:  Chief Complaint:  Chief Complaint  Patient presents with  . Left Hand - Routine Post Op   Visit Diagnoses:  1. Stenosing tenosynovitis of finger     Plan: She is a patient who is now about 9 days out trigger finger release on the left hand and CMC injection on the right hand.  The injection helped her right hand for about several days but now the pain is recurred.  Trigger finger releases are doing well.  Sutures are discontinued Steri-Strips applied.  Rena try a [place] OA brace for the right hand and recheck on that nerve study for the right hand to see about the status of the carpal tunnel syndrome on the right hand.  We will see her back in about 4 weeks for clinical recheck on the fingers on the left the thumb on the right as well as the carpal tunnel syndrome on the right.  Follow-Up Instructions: Return in about 4 weeks (around 09/24/2019).   Orders:  No orders of the defined types were placed in this encounter.  No orders of the defined types were placed in this encounter.   Imaging: No results found.  PMFS History: Patient Active Problem List   Diagnosis Date Noted  . IDA (iron deficiency anemia) 05/13/2019  . Superior glenoid labrum lesion of left shoulder   . Lumbar stenosis 02/04/2018  . Hx of hysterectomy 08/13/2017  . Pain in left wrist 06/12/2017  . Chronic venous insufficiency 06/09/2017  . Varicose veins of both lower extremities with pain 04/30/2017  . Claudication ([place]) 04/30/2017  . Arthritis of carpometacarpal [facility]) joint of left thumb 05/12/2016  . Bilateral thumb pain 05/01/2016  . Anemia 02/03/2016  . Status post bilateral total hip replacement 01/10/2016  . Sicca ([place]) 01/10/2016  . Age related osteoporosis 01/10/2016  . Hip arthritis 08/16/2015  . Microscopic  hematuria 07/30/2015  . Vaginal atrophy 07/30/2015  . GAD (generalized anxiety disorder) 07/08/2015  . Moderate episode of recurrent major depressive disorder ([place]) 07/08/2015  . Migraine with aura and without status migrainosus, not intractable 07/08/2015  . Dyslipidemia 07/08/2015  . Degenerative arthritis of right knee 03/16/2015  . Atrophic vaginitis 01/31/2015  . Recurrent UTI 12/31/2014  . IBS (irritable bowel syndrome) 10/29/2014  . Asthma, moderate persistent 09/08/2014  . Allergic state 09/07/2014  . Colon polyp 09/07/2014  . Hemorrhoid 09/07/2014  . Constrictive tenosynovitis 03/12/2014  . H/O total knee replacement, bilateral 07/31/2013  . CAD (coronary artery disease) 11/28/2012  . Anxiety 11/28/2012  . Acid reflux 11/28/2012  . Cardiac murmur 11/28/2012  . BP (high blood pressure) 11/28/2012  . Cannot sleep 11/28/2012  . Adiposity 11/28/2012  . Restless leg 11/28/2012  . Supraventricular tachycardia ([place]) 11/28/2012  . Fibromyalgia 11/28/2012  . Tachycardia 11/28/2012  . Anginal pain ([place]) 11/28/2012   Past Medical History:  Diagnosis Date  . Allergy   . Allergy to adhesive tape    Allergy to paper tape as well  . Alzheimer disease ([place])   . Anemia   . Anginal pain ([place])   . Anxiety   . Arthritis   . Asthma   . Bronchitis   . Cataract   . Chronic nausea   . Claustrophobia   .  DDD (degenerative disc disease), lumbar   . Depression   . Dry eyes    uses Restasis  . Fibromyalgia   . GERD (gastroesophageal reflux disease)   . H/O bladder infections   . Heart murmur   . Hematuria   . Hyperlipidemia   . Hypertension   . Hypothyroidism   . IBS (irritable bowel syndrome)   . IDA (iron deficiency anemia) 05/13/2019  . Insomnia   . Lumbar neuritis   . Migraines   . Obesity   . Osteoporosis   . Palpitation   . RLS (restless legs syndrome)   . Shoulder fracture, right   . Sicca ([place])   . Tachycardia   . Thyroid disease   . Wears dentures    partial  lower    Family History  Problem Relation Age of Onset  . Diabetes Mother   . Hypertension Mother   . Heart disease Mother   . Alzheimer's disease Mother   . Diabetes Father   . Throat cancer Father   . Hypertension Sister   . Depression Sister   . Fibromyalgia Sister   . Cancer Brother   . Heart disease Brother   . Stroke Son   . Fibromyalgia Daughter   . Bladder Cancer Neg Hx   . Kidney disease Neg Hx   . Kidney cancer Neg Hx     Past Surgical History:  Procedure Laterality Date  . ABDOMINAL HYSTERECTOMY     due to cancer of ovaries  . BACK SURGERY N/A 2014   x 5  . BACK SURGERY  12/2017  . CARDIAC CATHETERIZATION     no PCI; 12/18/12 ([facility], Dr. Marcello Moores): EP study with ablation. No coronary angiography done then.  [name] CATARACT EXTRACTION W/PHACO Right 04/01/2019   Procedure: CATARACT EXTRACTION PHACO AND INTRAOCULAR LENS PLACEMENT (IOC) RIGHT  TORIC 6.19 00:40.1;  Surgeon: Birder Robson, MD;  Location: [place];  Service: Ophthalmology;  Laterality: Right;  . CATARACT EXTRACTION W/PHACO Left 04/29/2019   Procedure: CATARACT EXTRACTION PHACO AND INTRAOCULAR LENS PLACEMENT (IOC)  LEFT TORIC LENS 4.52  00:29.1;  Surgeon: Birder Robson, MD;  Location: [place];  Service: Ophthalmology;  Laterality: Left;  . COLONOSCOPY W/ POLYPECTOMY    . COLONOSCOPY WITH PROPOFOL N/A 02/04/2016   Procedure: COLONOSCOPY WITH PROPOFOL;  Surgeon: Robert Bellow, MD;  [facility] ENDOSCOPY;  Service: Endoscopy;  Laterality: N/A;  . ESOPHAGOGASTRODUODENOSCOPY (EGD) WITH PROPOFOL N/A 02/04/2016   Procedure: ESOPHAGOGASTRODUODENOSCOPY (EGD) WITH PROPOFOL;  Surgeon: Robert Bellow, MD;  Location: [facility] ENDOSCOPY;  Service: Endoscopy;  Laterality: N/A;  . FINGER ARTHROSCOPY WITH CARPOMETACARPEL ([place]) ARTHROPLASTY Left 05/01/2016   Dr. Erlinda Hong  . GIVENS CAPSULE STUDY N/A 05/02/2016   Procedure: GIVENS CAPSULE STUDY;  Surgeon: Manya Silvas, MD;  Location: [facility] ENDOSCOPY;   Service: Endoscopy;  Laterality: N/A;  . HAND SURGERY    . HEMORRHOID SURGERY    . HIP ARTHROPLASTY    . JOINT REPLACEMENT Bilateral    knee  . KNEE ARTHROPLASTY    . REPLACEMENT TOTAL KNEE Left   . SHOULDER ARTHROSCOPY Left 09/09/2018   Procedure: LEFT SHOULDER ARTHROSCOPY, BICEPS TENOTOMY;  Surgeon: Meredith Pel, MD;  Location: [place];  Service: Orthopedics;  Laterality: Left;  . SPINE SURGERY     x 2  . tendonititis     right elbow, right wrist  . TOTAL HIP ARTHROPLASTY Left 2013  . TOTAL HIP ARTHROPLASTY Right 08/16/2015   Procedure: TOTAL HIP ARTHROPLASTY  ANTERIOR APPROACH;  Surgeon: Meredith Pel, MD;  Location: [place];  Service: Orthopedics;  Laterality: Right;  . TOTAL KNEE ARTHROPLASTY Right 03/16/2015   Procedure: RIGHT TOTAL KNEE ARTHROPLASTY, PCL SACRIFICING;  Surgeon: Meredith Pel, MD;  Location: [place];  Service: Orthopedics;  Laterality: Right;  . TRIGGER FINGER RELEASE  08/18/2019   Social History   Occupational History  . Occupation: Retired  Tobacco Use  . Smoking status: Never Smoker  . Smokeless tobacco: Never Used  . Tobacco comment: smoking cessation materials not required  Vaping Use  . Vaping Use: Never used  Substance and Sexual Activity  . Alcohol use: No    Alcohol/week: 0.0 standard drinks  . Drug use: No  . Sexual activity: Never

## 2019-08-29 NOTE — Telephone Encounter (Signed)
Patient left voicemail stating steri-strips have come off.  Had trigger finger surgery 9days ago. Sutures removed on 6/30. States incision has opened some with no bleeding.  Consulted with Dr. Marlou Sa ok to place bandaids on incisions, keep hand dry, no getting wet or submerging in water.   appt made for wed 7/7 for incision check

## 2019-09-03 ENCOUNTER — Ambulatory Visit (INDEPENDENT_AMBULATORY_CARE_PROVIDER_SITE_OTHER): Payer: Medicare Other | Admitting: Orthopedic Surgery

## 2019-09-03 DIAGNOSIS — M6530 Trigger finger, unspecified finger: Secondary | ICD-10-CM

## 2019-09-06 ENCOUNTER — Other Ambulatory Visit: Payer: Self-pay | Admitting: Rheumatology

## 2019-09-06 DIAGNOSIS — M797 Fibromyalgia: Secondary | ICD-10-CM

## 2019-09-07 ENCOUNTER — Encounter: Payer: Self-pay | Admitting: Orthopedic Surgery

## 2019-09-07 NOTE — Progress Notes (Signed)
Post-Op Visit Note   Patient: Jacqueline Lucas           Date of Birth: 08/18/1950           MRN: [id number] Visit Date: 09/03/2019 PCP: [name], MD   Assessment & Plan:  Chief Complaint:  Chief Complaint  Patient presents with  . check incision   Visit Diagnoses:  1. Stenosing tenosynovitis of finger     Plan: Patient is a 69 year old female who presents following trigger finger release for incision check.  She states that her incision opened a little bit but has healed over.  She has no more triggering of the 2 operative digits.  She denies any bleeding or drainage.  She has been using Steri-Strips to keep the incision reapproximated.  On exam incision is healing well with no evidence of incision gapping.  There is no erythema or expressible drainage.  She does have some swelling around the incision sites.  Recommend patient take Aleve to help with swelling.  She denies any fevers, chills, night sweats.  Overall she seems to be healing well.  Plan for her to follow-up in 4 weeks for clinical recheck.  Follow-Up Instructions: No follow-ups on file.   Orders:  No orders of the defined types were placed in this encounter.  No orders of the defined types were placed in this encounter.   Imaging: No results found.  PMFS History: Patient Active Problem List   Diagnosis Date Noted  . IDA (iron deficiency anemia) 05/13/2019  . Superior glenoid labrum lesion of left shoulder   . Lumbar stenosis 02/04/2018  . Hx of hysterectomy 08/13/2017  . Pain in left wrist 06/12/2017  . Chronic venous insufficiency 06/09/2017  . Varicose veins of both lower extremities with pain 04/30/2017  . Claudication ([place]) 04/30/2017  . Arthritis of carpometacarpal [facility]) joint of left thumb 05/12/2016  . Bilateral thumb pain 05/01/2016  . Anemia 02/03/2016  . Status post bilateral total hip replacement 01/10/2016  . Sicca ([place]) 01/10/2016  . Age related osteoporosis 01/10/2016  . Hip  arthritis 08/16/2015  . Microscopic hematuria 07/30/2015  . Vaginal atrophy 07/30/2015  . GAD (generalized anxiety disorder) 07/08/2015  . Moderate episode of recurrent major depressive disorder ([place]) 07/08/2015  . Migraine with aura and without status migrainosus, not intractable 07/08/2015  . Dyslipidemia 07/08/2015  . Degenerative arthritis of right knee 03/16/2015  . Atrophic vaginitis 01/31/2015  . Recurrent UTI 12/31/2014  . IBS (irritable bowel syndrome) 10/29/2014  . Asthma, moderate persistent 09/08/2014  . Allergic state 09/07/2014  . Colon polyp 09/07/2014  . Hemorrhoid 09/07/2014  . Constrictive tenosynovitis 03/12/2014  . H/O total knee replacement, bilateral 07/31/2013  . CAD (coronary artery disease) 11/28/2012  . Anxiety 11/28/2012  . Acid reflux 11/28/2012  . Cardiac murmur 11/28/2012  . BP (high blood pressure) 11/28/2012  . Cannot sleep 11/28/2012  . Adiposity 11/28/2012  . Restless leg 11/28/2012  . Supraventricular tachycardia ([place]) 11/28/2012  . Fibromyalgia 11/28/2012  . Tachycardia 11/28/2012  . Anginal pain ([place]) 11/28/2012   Past Medical History:  Diagnosis Date  . Allergy   . Allergy to adhesive tape    Allergy to paper tape as well  . Alzheimer disease ([place])   . Anemia   . Anginal pain ([employer])   . Anxiety   . Arthritis   . Asthma   . Bronchitis   . Cataract   . Chronic nausea   . Claustrophobia   . DDD (degenerative disc disease),  lumbar   . Depression   . Dry eyes    uses Restasis  . Fibromyalgia   . GERD (gastroesophageal reflux disease)   . H/O bladder infections   . Heart murmur   . Hematuria   . Hyperlipidemia   . Hypertension   . Hypothyroidism   . IBS (irritable bowel syndrome)   . IDA (iron deficiency anemia) 05/13/2019  . Insomnia   . Lumbar neuritis   . Migraines   . Obesity   . Osteoporosis   . Palpitation   . RLS (restless legs syndrome)   . Shoulder fracture, right   . Sicca ([place])   . Tachycardia   . Thyroid  disease   . Wears dentures    partial lower    Family History  Problem Relation Age of Onset  . Diabetes Mother   . Hypertension Mother   . Heart disease Mother   . Alzheimer's disease Mother   . Diabetes Father   . Throat cancer Father   . Hypertension Sister   . Depression Sister   . Fibromyalgia Sister   . Cancer Brother   . Heart disease Brother   . Stroke Son   . Fibromyalgia Daughter   . Bladder Cancer Neg Hx   . Kidney disease Neg Hx   . Kidney cancer Neg Hx     Past Surgical History:  Procedure Laterality Date  . ABDOMINAL HYSTERECTOMY     due to cancer of ovaries  . BACK SURGERY N/A 2014   x 5  . BACK SURGERY  12/2017  . CARDIAC CATHETERIZATION     no PCI; 12/18/12 ([facility], Dr. Marcello Moores): EP study with ablation. No coronary angiography done then.  [name] CATARACT EXTRACTION W/PHACO Right 04/01/2019   Procedure: CATARACT EXTRACTION PHACO AND INTRAOCULAR LENS PLACEMENT (IOC) RIGHT  TORIC 6.19 00:40.1;  Surgeon: Birder Robson, MD;  Location: [place];  Service: Ophthalmology;  Laterality: Right;  . CATARACT EXTRACTION W/PHACO Left 04/29/2019   Procedure: CATARACT EXTRACTION PHACO AND INTRAOCULAR LENS PLACEMENT (IOC)  LEFT TORIC LENS 4.52  00:29.1;  Surgeon: Birder Robson, MD;  Location: [place];  Service: Ophthalmology;  Laterality: Left;  . COLONOSCOPY W/ POLYPECTOMY    . COLONOSCOPY WITH PROPOFOL N/A 02/04/2016   Procedure: COLONOSCOPY WITH PROPOFOL;  Surgeon: Robert Bellow, MD;  [facility] ENDOSCOPY;  Service: Endoscopy;  Laterality: N/A;  . ESOPHAGOGASTRODUODENOSCOPY (EGD) WITH PROPOFOL N/A 02/04/2016   Procedure: ESOPHAGOGASTRODUODENOSCOPY (EGD) WITH PROPOFOL;  Surgeon: Robert Bellow, MD;  Location: [facility] ENDOSCOPY;  Service: Endoscopy;  Laterality: N/A;  . FINGER ARTHROSCOPY WITH CARPOMETACARPEL ([place]) ARTHROPLASTY Left 05/01/2016   Dr. Erlinda Hong  . GIVENS CAPSULE STUDY N/A 05/02/2016   Procedure: GIVENS CAPSULE STUDY;  Surgeon: Manya Silvas, MD;  Location: [facility] ENDOSCOPY;  Service: Endoscopy;  Laterality: N/A;  . HAND SURGERY    . HEMORRHOID SURGERY    . HIP ARTHROPLASTY    . JOINT REPLACEMENT Bilateral    knee  . KNEE ARTHROPLASTY    . REPLACEMENT TOTAL KNEE Left   . SHOULDER ARTHROSCOPY Left 09/09/2018   Procedure: LEFT SHOULDER ARTHROSCOPY, BICEPS TENOTOMY;  Surgeon: Meredith Pel, MD;  Location: [place];  Service: Orthopedics;  Laterality: Left;  . SPINE SURGERY     x 2  . tendonititis     right elbow, right wrist  . TOTAL HIP ARTHROPLASTY Left 2013  . TOTAL HIP ARTHROPLASTY Right 08/16/2015   Procedure: TOTAL HIP ARTHROPLASTY ANTERIOR APPROACH;  Surgeon:  Meredith Pel, MD;  Location: [place];  Service: Orthopedics;  Laterality: Right;  . TOTAL KNEE ARTHROPLASTY Right 03/16/2015   Procedure: RIGHT TOTAL KNEE ARTHROPLASTY, PCL SACRIFICING;  Surgeon: Meredith Pel, MD;  Location: [place];  Service: Orthopedics;  Laterality: Right;  . TRIGGER FINGER RELEASE  08/18/2019   Social History   Occupational History  . Occupation: Retired  Tobacco Use  . Smoking status: Never Smoker  . Smokeless tobacco: Never Used  . Tobacco comment: smoking cessation materials not required  Vaping Use  . Vaping Use: Never used  Substance and Sexual Activity  . Alcohol use: No    Alcohol/week: 0.0 standard drinks  . Drug use: No  . Sexual activity: Never

## 2019-09-08 NOTE — Telephone Encounter (Signed)
Last Visit: 05/29/2019  Next Visit: 09/17/2019  Current Dose per office note on 05/29/2019: She is taking gabapentin 300 mg 2 capsules in the morning, 1 capsule in afternoon, and 2 capsules at bedtime. DX: Fibromyalgia  Okay to refill gabapentin?

## 2019-09-10 ENCOUNTER — Other Ambulatory Visit: Payer: Self-pay | Admitting: Family Medicine

## 2019-09-10 DIAGNOSIS — J449 Chronic obstructive pulmonary disease, unspecified: Secondary | ICD-10-CM

## 2019-09-13 ENCOUNTER — Other Ambulatory Visit: Payer: Self-pay | Admitting: Family Medicine

## 2019-09-13 DIAGNOSIS — F3160 Bipolar disorder, current episode mixed, unspecified: Secondary | ICD-10-CM

## 2019-09-13 NOTE — Telephone Encounter (Signed)
Per note dated 08/22/19 was to stop Effexor called pt and LM on VM to call back and update if still taking med or has discontinued med

## 2019-09-15 ENCOUNTER — Other Ambulatory Visit: Payer: Self-pay

## 2019-09-17 ENCOUNTER — Other Ambulatory Visit: Payer: Self-pay

## 2019-09-17 ENCOUNTER — Encounter: Payer: Self-pay | Admitting: Physician Assistant

## 2019-09-17 ENCOUNTER — Ambulatory Visit (INDEPENDENT_AMBULATORY_CARE_PROVIDER_SITE_OTHER): Payer: Medicare Other | Admitting: Physician Assistant

## 2019-09-17 VITALS — BP 114/61 | HR 79 | Resp 12 | Ht 64.0 in | Wt 208.0 lb

## 2019-09-17 DIAGNOSIS — M5136 Other intervertebral disc degeneration, lumbar region: Secondary | ICD-10-CM

## 2019-09-17 DIAGNOSIS — Z8659 Personal history of other mental and behavioral disorders: Secondary | ICD-10-CM

## 2019-09-17 DIAGNOSIS — M7062 Trochanteric bursitis, left hip: Secondary | ICD-10-CM

## 2019-09-17 DIAGNOSIS — Z96643 Presence of artificial hip joint, bilateral: Secondary | ICD-10-CM

## 2019-09-17 DIAGNOSIS — M8589 Other specified disorders of bone density and structure, multiple sites: Secondary | ICD-10-CM | POA: Diagnosis not present

## 2019-09-17 DIAGNOSIS — Z9889 Other specified postprocedural states: Secondary | ICD-10-CM | POA: Diagnosis not present

## 2019-09-17 DIAGNOSIS — Z96653 Presence of artificial knee joint, bilateral: Secondary | ICD-10-CM | POA: Diagnosis not present

## 2019-09-17 DIAGNOSIS — M3501 Sicca syndrome with keratoconjunctivitis: Secondary | ICD-10-CM | POA: Diagnosis not present

## 2019-09-17 DIAGNOSIS — Z862 Personal history of diseases of the blood and blood-forming organs and certain disorders involving the immune mechanism: Secondary | ICD-10-CM

## 2019-09-17 DIAGNOSIS — Z8679 Personal history of other diseases of the circulatory system: Secondary | ICD-10-CM

## 2019-09-17 DIAGNOSIS — I209 Angina pectoris, unspecified: Secondary | ICD-10-CM

## 2019-09-17 DIAGNOSIS — M7061 Trochanteric bursitis, right hip: Secondary | ICD-10-CM

## 2019-09-17 DIAGNOSIS — M1812 Unilateral primary osteoarthritis of first carpometacarpal joint, left hand: Secondary | ICD-10-CM

## 2019-09-17 DIAGNOSIS — M797 Fibromyalgia: Secondary | ICD-10-CM | POA: Diagnosis not present

## 2019-09-17 DIAGNOSIS — G2581 Restless legs syndrome: Secondary | ICD-10-CM | POA: Diagnosis not present

## 2019-09-17 DIAGNOSIS — Z8719 Personal history of other diseases of the digestive system: Secondary | ICD-10-CM

## 2019-09-17 NOTE — Progress Notes (Signed)
Office Visit Note  Patient: Jacqueline Lucas             Date of Birth: 04/27/1950           MRN: [id number]             PCP: [name], MD Referring: [name], MD Visit Date: 09/17/2019 Occupation: @GUAROCC @  Subjective:  Trochanter bursitis bilaterally   History of Present Illness: Jacqueline Lucas is a 69 y.o. female with history of fibromyalgia, osteoarthritis, and DDD.  She presents today with discomfort due to trochanteric bursitis bilaterally.  She has been performing stretching exercises on a daily basis without much relief.  She has been experiencing increased nocturnal pain.  She would like cortisone injections today. She is using a cane to assist with ambulation.   She denies any recent falls.  She states her left shoulder joint is doing better after the last cortisone injection on 08/08/2019.  She experiences intermittent pain and stiffness in both hands.  She denies any joint swelling.  She states that she is wearing a CMC joint brace which has been helpful.  She states that about 1 month ago she had trigger finger release performed by Dr. Marlou Sa is clinically doing well  Activities of Daily Living:  Patient reports morning stiffness for 40 minutes.   Patient Reports nocturnal pain.  Difficulty dressing/grooming: Denies Difficulty climbing stairs: Denies Difficulty getting out of chair: Denies Difficulty using hands for taps, buttons, cutlery, and/or writing: Reports  Review of Systems  Constitutional: Positive for fatigue.  HENT: Positive for mouth dryness. Negative for mouth sores and nose dryness.   Eyes: Negative for pain, visual disturbance and dryness.  Respiratory: Negative for cough, hemoptysis, shortness of breath and difficulty breathing.   Cardiovascular: Negative for chest pain, palpitations, hypertension and swelling in legs/feet.  Gastrointestinal: Negative for blood in stool, constipation and diarrhea.  Endocrine: Negative for increased  urination.  Genitourinary: Negative for painful urination.  Musculoskeletal: Positive for arthralgias, joint pain, myalgias, muscle tenderness and myalgias. Negative for joint swelling, muscle weakness and morning stiffness.  Skin: Negative for color change, pallor, rash, hair loss, nodules/bumps, skin tightness, ulcers and sensitivity to sunlight.  Allergic/Immunologic: Negative for susceptible to infections.  Neurological: Negative for dizziness, numbness, headaches and weakness.  Hematological: Negative for swollen glands.  Psychiatric/Behavioral: Positive for sleep disturbance. Negative for depressed mood. The patient is not nervous/anxious.     PMFS History:  Patient Active Problem List   Diagnosis Date Noted  . IDA (iron deficiency anemia) 05/13/2019  . Superior glenoid labrum lesion of left shoulder   . Lumbar stenosis 02/04/2018  . Hx of hysterectomy 08/13/2017  . Pain in left wrist 06/12/2017  . Chronic venous insufficiency 06/09/2017  . Varicose veins of both lower extremities with pain 04/30/2017  . Claudication ([place]) 04/30/2017  . Arthritis of carpometacarpal [facility]) joint of left thumb 05/12/2016  . Bilateral thumb pain 05/01/2016  . Anemia 02/03/2016  . Status post bilateral total hip replacement 01/10/2016  . Sicca ([place]) 01/10/2016  . Age related osteoporosis 01/10/2016  . Hip arthritis 08/16/2015  . Microscopic hematuria 07/30/2015  . Vaginal atrophy 07/30/2015  . GAD (generalized anxiety disorder) 07/08/2015  . Moderate episode of recurrent major depressive disorder ([place]) 07/08/2015  . Migraine with aura and without status migrainosus, not intractable 07/08/2015  . Dyslipidemia 07/08/2015  . Degenerative arthritis of right knee 03/16/2015  . Atrophic vaginitis 01/31/2015  . Recurrent UTI 12/31/2014  . IBS (irritable bowel syndrome)  10/29/2014  . Asthma, moderate persistent 09/08/2014  . Allergic state 09/07/2014  . Colon polyp 09/07/2014  . Hemorrhoid  09/07/2014  . Constrictive tenosynovitis 03/12/2014  . H/O total knee replacement, bilateral 07/31/2013  . CAD (coronary artery disease) 11/28/2012  . Anxiety 11/28/2012  . Acid reflux 11/28/2012  . Cardiac murmur 11/28/2012  . BP (high blood pressure) 11/28/2012  . Cannot sleep 11/28/2012  . Adiposity 11/28/2012  . Restless leg 11/28/2012  . Supraventricular tachycardia ([place]) 11/28/2012  . Fibromyalgia 11/28/2012  . Tachycardia 11/28/2012  . Anginal pain ([place]) 11/28/2012    Past Medical History:  Diagnosis Date  . Allergy   . Allergy to adhesive tape    Allergy to paper tape as well  . Alzheimer disease ([place])   . Anemia   . Anginal pain ([place])   . Anxiety   . Arthritis   . Asthma   . Bronchitis   . Cataract   . Chronic nausea   . Claustrophobia   . DDD (degenerative disc disease), lumbar   . Depression   . Dry eyes    uses Restasis  . Fibromyalgia   . GERD (gastroesophageal reflux disease)   . H/O bladder infections   . Heart murmur   . Hematuria   . Hyperlipidemia   . Hypertension   . Hypothyroidism   . IBS (irritable bowel syndrome)   . IDA (iron deficiency anemia) 05/13/2019  . Insomnia   . Lumbar neuritis   . Migraines   . Obesity   . Osteoporosis   . Palpitation   . RLS (restless legs syndrome)   . Shoulder fracture, right   . Sicca ([place])   . Tachycardia   . Thyroid disease   . Wears dentures    partial lower    Family History  Problem Relation Age of Onset  . Diabetes Mother   . Hypertension Mother   . Heart disease Mother   . Alzheimer's disease Mother   . Diabetes Father   . Throat cancer Father   . Hypertension Sister   . Depression Sister   . Fibromyalgia Sister   . Cancer Brother   . Heart disease Brother   . Stroke Son   . Fibromyalgia Daughter   . Bladder Cancer Neg Hx   . Kidney disease Neg Hx   . Kidney cancer Neg Hx    Past Surgical History:  Procedure Laterality Date  . ABDOMINAL HYSTERECTOMY     due to cancer of ovaries   . BACK SURGERY N/A 2014   x 5  . BACK SURGERY  12/2017  . CARDIAC CATHETERIZATION     no PCI; 12/18/12 ([facility], Dr. Marcello Moores): EP study with ablation. No coronary angiography done then.  [name] CATARACT EXTRACTION W/PHACO Right 04/01/2019   Procedure: CATARACT EXTRACTION PHACO AND INTRAOCULAR LENS PLACEMENT (IOC) RIGHT  TORIC 6.19 00:40.1;  Surgeon: Birder Robson, MD;  Location: [place];  Service: Ophthalmology;  Laterality: Right;  . CATARACT EXTRACTION W/PHACO Left 04/29/2019   Procedure: CATARACT EXTRACTION PHACO AND INTRAOCULAR LENS PLACEMENT (IOC)  LEFT TORIC LENS 4.52  00:29.1;  Surgeon: Birder Robson, MD;  Location: [place];  Service: Ophthalmology;  Laterality: Left;  . COLONOSCOPY W/ POLYPECTOMY    . COLONOSCOPY WITH PROPOFOL N/A 02/04/2016   Procedure: COLONOSCOPY WITH PROPOFOL;  Surgeon: Robert Bellow, MD;  Location[facility] ENDOSCOPY;  Service: Endoscopy;  Laterality: N/A;  . ESOPHAGOGASTRODUODENOSCOPY (EGD) WITH PROPOFOL N/A 02/04/2016   Procedure: ESOPHAGOGASTRODUODENOSCOPY (EGD) WITH PROPOFOL;  Surgeon:  Robert Bellow, MD;  Location: [facility] ENDOSCOPY;  Service: Endoscopy;  Laterality: N/A;  . FINGER ARTHROSCOPY WITH CARPOMETACARPEL ([place]) ARTHROPLASTY Left 05/01/2016   Dr. Erlinda Hong  . GIVENS CAPSULE STUDY N/A 05/02/2016   Procedure: GIVENS CAPSULE STUDY;  Surgeon: Manya Silvas, MD;  Location: [facility] ENDOSCOPY;  Service: Endoscopy;  Laterality: N/A;  . HAND SURGERY    . HEMORRHOID SURGERY    . HIP ARTHROPLASTY    . JOINT REPLACEMENT Bilateral    knee  . KNEE ARTHROPLASTY    . REPLACEMENT TOTAL KNEE Left   . SHOULDER ARTHROSCOPY Left 09/09/2018   Procedure: LEFT SHOULDER ARTHROSCOPY, BICEPS TENOTOMY;  Surgeon: Meredith Pel, MD;  Location: [place];  Service: Orthopedics;  Laterality: Left;  . SPINE SURGERY     x 2  . tendonititis     right elbow, right wrist  . TOTAL HIP ARTHROPLASTY Left 2013  . TOTAL HIP ARTHROPLASTY Right 08/16/2015     Procedure: TOTAL HIP ARTHROPLASTY ANTERIOR APPROACH;  Surgeon: Meredith Pel, MD;  Location: [place];  Service: Orthopedics;  Laterality: Right;  . TOTAL KNEE ARTHROPLASTY Right 03/16/2015   Procedure: RIGHT TOTAL KNEE ARTHROPLASTY, PCL SACRIFICING;  Surgeon: Meredith Pel, MD;  Location: [place];  Service: Orthopedics;  Laterality: Right;  . TRIGGER FINGER RELEASE  08/18/2019   Social History   Social History Narrative   Patient has 10 grandchildren, 4 great-granddaughters & 2 great-grandsons.   Widow since 02/2018 - married for 42 years   Immunization History  Administered Date(s) Administered  . Fluad Quad(high Dose 65+) 10/28/2018  . Influenza, High Dose Seasonal PF 02/15/2015, 11/10/2015, 01/11/2017, 12/26/2017  . Influenza-Unspecified 02/25/2014, 12/31/2017  . Pneumococcal Conjugate-13 06/03/2013  . Pneumococcal Polysaccharide-23 09/05/2011, 01/11/2017  . Tdap 09/05/2011  . Zoster 12/12/2010     Objective: Vital Signs: BP 114/61 (BP Location: Left Arm, Patient Position: Sitting, Cuff Size: Small)   Pulse 79   Resp 12   Ht 5\' 4"  (1.626 m)   Wt 208 lb (94.3 kg)   BMI 35.70 kg/m    Physical Exam Vitals and nursing note reviewed.  Constitutional:      Appearance: She is well-developed.  HENT:     Head: Normocephalic and atraumatic.  Eyes:     Conjunctiva/sclera: Conjunctivae normal.  Pulmonary:     Effort: Pulmonary effort is normal.  Abdominal:     General: Bowel sounds are normal.     Palpations: Abdomen is soft.  Musculoskeletal:     Cervical back: Normal range of motion.  Lymphadenopathy:     Cervical: No cervical adenopathy.  Skin:    General: Skin is warm and dry.     Capillary Refill: Capillary refill takes less than 2 seconds.  Neurological:     Mental Status: She is alert and oriented to person, place, and time.  Psychiatric:        Behavior: Behavior normal.      Musculoskeletal Exam: C-spine, thoracic spine, and lumbar spine good ROM.   Shoulder joints, elbow joints, wrist joints, MCPs, PIPs, and DIPs good ROM with no synovitis.  Complete fist formation bilaterally.  She has PIP and DIP thickening consistent osteoarthritis of both hands.  Tenderness of the right CMC joint.  Hip joints good ROM.  Tenderness over trochanteric bursa bilaterally. Knee joints good ROM with no warmth or effusion.  No tenderness or inflammation of ankle joints.   CDAI Exam: CDAI Score: -- Patient Global: --; Provider Global: -- Swollen: --;  Tender: -- Joint Exam 09/17/2019   No joint exam has been documented for this visit   There is currently no information documented on the homunculus. Go to the Rheumatology activity and complete the homunculus joint exam.  Investigation: No additional findings.  Imaging: No results found.  Recent Labs: Lab Results  Component Value Date   WBC 6.8 06/25/2019   HGB 12.9 06/25/2019   PLT 222 06/25/2019   NA 140 05/05/2019   K 4.7 05/05/2019   CL 104 05/05/2019   CO2 29 05/05/2019   GLUCOSE 151 (H) 05/05/2019   BUN 11 05/05/2019   CREATININE 0.74 05/05/2019   BILITOT 0.3 05/05/2019   ALKPHOS 105 12/10/2015   AST 12 05/05/2019   ALT 8 05/05/2019   PROT 6.2 05/05/2019   ALBUMIN 4.0 12/10/2015   CALCIUM 9.1 05/05/2019   GFRAA 96 05/05/2019    Speciality Comments: No specialty comments available.  Procedures:  No procedures performed Allergies: Morphine, Lyrica [pregabalin], Metformin and related, Diazepam, Latex, Rash away  [petrolatum-zinc oxide], Salicylates, and Tape   Assessment / Plan:     Visit Diagnoses: Fibromyalgia: She has generalized hyperalgesia and positive tender points on exam.  She experiences intermittent myalgias and muscle tenderness due to underlying fibromyalgia.  She continues to have frequent flares.  She is having trapezius muscle tension and muscle tenderness bilaterally.  She presents today with trochanter bursitis of both hips.  She requested trochanter bursa cortisone  injection today.  She tolerated procedure well.  The procedure note was completed above.  She has been experiencing increased nocturnal pain.  She was encouraged to perform stretching exercises on a daily basis.  She was advised to notify us if her symptoms persist or worsen and we will refer her to physical therapy.  She has chronic fatigue secondary to insomnia.  We discussed the use of melatonin as well as good sleep hygiene habits.  We also discussed the importance of regular exercise.  Trochanteric bursitis of both hips: She has tenderness palpation over bilateral trochanteric bursa.  She has been performing daily exercises without much relief.  She requested trochanter bursa cortisone injections.  She tolerated the procedure well.  The procedure note was completed above.  Aftercare was discussed.  We will refer her to physical therapy if her symptoms persist or worsen.  DDD (degenerative disc disease), lumbar: She is not having any lower back pain at this time.  No symptoms of radiculopathy.  Status post bilateral total hip replacement: Doing well.  She has no discomfort at this time.  H/O total knee replacement, bilateral: Doing well.  She has good range of motion with no warmth or effusion.  She uses a cane to assist with ambulation.  She has not had any recent falls.  Primary osteoarthritis of first carpometacarpal joint of left hand: Thickening hand tenderness to palpation noted on exam.  We discussed the use of arthritis gloves.  She recently got a right CMC joint brace which has been helpful so she was encouraged to get a brace for her left hand.  Joint protection and muscle strengthening were discussed.  She was given a handout of hand exercises to perform.  S/P trigger finger release: Left ring and middle trigger finger release performed by Dr. Marlou Sa in June 3557 without complication.  Keratoconjunctivitis sicca ([place]): She continues to have chronic eye dryness.  She uses Restasis eyedrops  in both eyes twice daily.  Osteopenia of multiple sites: DEXA was within normal limits on 09/25/2017.  She  will be due to update her bone density in July 2024.  She has not had any recent falls or fractures.  Other medical conditions are listed as follows  History of cardiac murmur  History of anxiety  History of hypertension  History of depression  Restless leg  History of anemia  History of gastroesophageal reflux (GERD)  Orders: No orders of the defined types were placed in this encounter.  No orders of the defined types were placed in this encounter.   Face-to-face time spent with patient was 30 minutes. Greater than 50% of time was spent in counseling and coordination of care.  Follow-Up Instructions: Return in about 6 months (around 03/19/2020) for Fibromyalgia, Osteoarthritis, DDD.   Ofilia Neas, PA-C  Note - This record has been created using Dragon software.  Chart creation errors have been sought, but may not always  have been located. Such creation errors do not reflect on  the standard of medical care.

## 2019-09-17 NOTE — Patient Instructions (Signed)
Hand Exercises Hand exercises can be helpful for almost anyone. These exercises can strengthen the hands, improve flexibility and movement, and increase blood flow to the hands. These results can make work and daily tasks easier. Hand exercises can be especially helpful for people who have joint pain from arthritis or have nerve damage from overuse (carpal tunnel syndrome). These exercises can also help people who have injured a hand. Exercises Most of these hand exercises are gentle stretching and motion exercises. It is usually safe to do them often throughout the day. Warming up your hands before exercise may help to reduce stiffness. You can do this with gentle massage or by placing your hands in warm water for 10-15 minutes. It is normal to feel some stretching, pulling, tightness, or mild discomfort as you begin new exercises. This will gradually improve. Stop an exercise right away if you feel sudden, severe pain or your pain gets worse. Ask your health care provider which exercises are best for you. Knuckle bend or "claw" fist 1. Stand or sit with your arm, hand, and all five fingers pointed straight up. Make sure to keep your wrist straight during the exercise. 2. Gently bend your fingers down toward your palm until the tips of your fingers are touching the top of your palm. Keep your big knuckle straight and just bend the small knuckles in your fingers. 3. Hold this position for __________ seconds. 4. Straighten (extend) your fingers back to the starting position. Repeat this exercise 5-10 times with each hand. Full finger fist 1. Stand or sit with your arm, hand, and all five fingers pointed straight up. Make sure to keep your wrist straight during the exercise. 2. Gently bend your fingers into your palm until the tips of your fingers are touching the middle of your palm. 3. Hold this position for __________ seconds. 4. Extend your fingers back to the starting position, stretching every  joint fully. Repeat this exercise 5-10 times with each hand. Straight fist 1. Stand or sit with your arm, hand, and all five fingers pointed straight up. Make sure to keep your wrist straight during the exercise. 2. Gently bend your fingers at the big knuckle, where your fingers meet your hand, and the middle knuckle. Keep the knuckle at the tips of your fingers straight and try to touch the bottom of your palm. 3. Hold this position for __________ seconds. 4. Extend your fingers back to the starting position, stretching every joint fully. Repeat this exercise 5-10 times with each hand. Tabletop 1. Stand or sit with your arm, hand, and all five fingers pointed straight up. Make sure to keep your wrist straight during the exercise. 2. Gently bend your fingers at the big knuckle, where your fingers meet your hand, as far down as you can while keeping the small knuckles in your fingers straight. Think of forming a tabletop with your fingers. 3. Hold this position for __________ seconds. 4. Extend your fingers back to the starting position, stretching every joint fully. Repeat this exercise 5-10 times with each hand. Finger spread 1. Place your hand flat on a table with your palm facing down. Make sure your wrist stays straight as you do this exercise. 2. Spread your fingers and thumb apart from each other as far as you can until you feel a gentle stretch. Hold this position for __________ seconds. 3. Bring your fingers and thumb tight together again. Hold this position for __________ seconds. Repeat this exercise 5-10 times with each hand.   Making circles 1. Stand or sit with your arm, hand, and all five fingers pointed straight up. Make sure to keep your wrist straight during the exercise. 2. Make a circle by touching the tip of your thumb to the tip of your index finger. 3. Hold for __________ seconds. Then open your hand wide. 4. Repeat this motion with your thumb and each finger on your  hand. Repeat this exercise 5-10 times with each hand. Thumb motion 1. Sit with your forearm resting on a table and your wrist straight. Your thumb should be facing up toward the ceiling. Keep your fingers relaxed as you move your thumb. 2. Lift your thumb up as high as you can toward the ceiling. Hold for __________ seconds. 3. Bend your thumb across your palm as far as you can, reaching the tip of your thumb for the small finger (pinkie) side of your palm. Hold for __________ seconds. Repeat this exercise 5-10 times with each hand. Grip strengthening  1. Hold a stress ball or other soft ball in the middle of your hand. 2. Slowly increase the pressure, squeezing the ball as much as you can without causing pain. Think of bringing the tips of your fingers into the middle of your palm. All of your finger joints should bend when doing this exercise. 3. Hold your squeeze for __________ seconds, then relax. Repeat this exercise 5-10 times with each hand. Contact a health care provider if:  Your hand pain or discomfort gets much worse when you do an exercise.  Your hand pain or discomfort does not improve within 2 hours after you exercise. If you have any of these problems, stop doing these exercises right away. Do not do them again unless your health care provider says that you can. Get help right away if:  You develop sudden, severe hand pain or swelling. If this happens, stop doing these exercises right away. Do not do them again unless your health care provider says that you can. This information is not intended to replace advice given to you by your health care provider. Make sure you discuss any questions you have with your health care provider. Document Revised: 06/06/2018 Document Reviewed: 02/14/2018 [employer]vier Patient Education  2020 [employer]  

## 2019-09-24 ENCOUNTER — Ambulatory Visit: Payer: Medicare Other | Admitting: Orthopedic Surgery

## 2019-09-25 ENCOUNTER — Ambulatory Visit (INDEPENDENT_AMBULATORY_CARE_PROVIDER_SITE_OTHER): Payer: Medicare Other

## 2019-09-25 ENCOUNTER — Telehealth: Payer: Self-pay | Admitting: Orthopedic Surgery

## 2019-09-25 ENCOUNTER — Telehealth: Payer: Self-pay | Admitting: Family Medicine

## 2019-09-25 ENCOUNTER — Ambulatory Visit: Payer: Medicare Other

## 2019-09-25 ENCOUNTER — Telehealth: Payer: Self-pay | Admitting: Physical Medicine and Rehabilitation

## 2019-09-25 ENCOUNTER — Other Ambulatory Visit: Payer: Self-pay | Admitting: Family Medicine

## 2019-09-25 ENCOUNTER — Other Ambulatory Visit: Payer: Self-pay

## 2019-09-25 VITALS — BP 122/66 | HR 79 | Temp 96.9°F | Resp 16 | Ht 64.0 in | Wt 206.3 lb

## 2019-09-25 DIAGNOSIS — Z1231 Encounter for screening mammogram for malignant neoplasm of breast: Secondary | ICD-10-CM

## 2019-09-25 DIAGNOSIS — Z780 Asymptomatic menopausal state: Secondary | ICD-10-CM

## 2019-09-25 DIAGNOSIS — Z0000 Encounter for general adult medical examination without abnormal findings: Secondary | ICD-10-CM

## 2019-09-25 NOTE — Patient Instructions (Signed)
[name] , Thank you for taking time to come for your Medicare Wellness Visit. I appreciate your ongoing commitment to your health goals. Please review the following plan we discussed and let me know if I can assist you in the future.   Screening recommendations/referrals: Colonoscopy: done 02/04/16. Repeat in 2027 Mammogram: done 12/06/18. Please call [phone] to schedule your mammogram and bone density  Bone Density: done 09/25/17 Recommended yearly ophthalmology/optometry visit for glaucoma screening and checkup Recommended yearly dental visit for hygiene and checkup  Vaccinations: Influenza vaccine: done 10/28/18 Pneumococcal vaccine: done 01/11/17 Tdap vaccine: done 09/05/11 Shingles vaccine: Shingrix discussed. Please contact your pharmacy for coverage information.  Covid-19: discussed  Advanced directives: Please bring a copy of your health care power of attorney and living will to the office at your convenience once you have completed that paperwork  Conditions/risks identified: Recommend increasing physical activity   Next appointment: Follow up in one year for your annual wellness visit    Preventive Care 65 Years and Older, Female Preventive care refers to lifestyle choices and visits with your health care provider that can promote health and wellness. What does preventive care include?  A yearly physical exam. This is also called an annual well check.  Dental exams once or twice a year.  Routine eye exams. Ask your health care provider how often you should have your eyes checked.  Personal lifestyle choices, including:  Daily care of your teeth and gums.  Regular physical activity.  Eating a healthy diet.  Avoiding tobacco and drug use.  Limiting alcohol use.  Practicing safe sex.  Taking low-dose aspirin every day.  Taking vitamin and mineral supplements as recommended by your health care provider. What happens during an annual well check? The  services and screenings done by your health care provider during your annual well check will depend on your age, overall health, lifestyle risk factors, and family history of disease. Counseling  Your health care provider may ask you questions about your:  Alcohol use.  Tobacco use.  Drug use.  Emotional well-being.  Home and relationship well-being.  Sexual activity.  Eating habits.  History of falls.  Memory and ability to understand (cognition).  Work and work Statistician.  Reproductive health. Screening  You may have the following tests or measurements:  Height, weight, and BMI.  Blood pressure.  Lipid and cholesterol levels. These may be checked every 5 years, or more frequently if you are over 69 years old.  Skin check.  Lung cancer screening. You may have this screening every year starting at age 10 if you have a 30-pack-year history of smoking and currently smoke or have quit within the past 15 years.  Fecal occult blood test (FOBT) of the stool. You may have this test every year starting at age 27.  Flexible sigmoidoscopy or colonoscopy. You may have a sigmoidoscopy every 5 years or a colonoscopy every 10 years starting at age 48.  Hepatitis C blood test.  Hepatitis B blood test.  Sexually transmitted disease (STD) testing.  Diabetes screening. This is done by checking your blood sugar (glucose) after you have not eaten for a while (fasting). You may have this done every 1-3 years.  Bone density scan. This is done to screen for osteoporosis. You may have this done starting at age 27.  Mammogram. This may be done every 1-2 years. Talk to your health care provider about how often you should have regular mammograms. Talk with your health care provider about  your test results, treatment options, and if necessary, the need for more tests. Vaccines  Your health care provider may recommend certain vaccines, such as:  Influenza vaccine. This is recommended  every year.  Tetanus, diphtheria, and acellular pertussis (Tdap, Td) vaccine. You may need a Td booster every 10 years.  Zoster vaccine. You may need this after age 27.  Pneumococcal 13-valent conjugate (PCV13) vaccine. One dose is recommended after age 61.  Pneumococcal polysaccharide (PPSV23) vaccine. One dose is recommended after age 69. Talk to your health care provider about which screenings and vaccines you need and how often you need them. This information is not intended to replace advice given to you by your health care provider. Make sure you discuss any questions you have with your health care provider. Document Released: 03/12/2015 Document Revised: 11/03/2015 Document Reviewed: 12/15/2014 Elsevier Interactive Patient Education  2017 [place] Prevention in the Home Falls can cause injuries. They can happen to people of all ages. There are many things you can do to make your home safe and to help prevent falls. What can I do on the outside of my home?  Regularly fix the edges of walkways and driveways and fix any cracks.  Remove anything that might make you trip as you walk through a door, such as a raised step or threshold.  Trim any bushes or trees on the path to your home.  Use bright outdoor lighting.  Clear any walking paths of anything that might make someone trip, such as rocks or tools.  Regularly check to see if handrails are loose or broken. Make sure that both sides of any steps have handrails.  Any raised decks and porches should have guardrails on the edges.  Have any leaves, snow, or ice cleared regularly.  Use sand or salt on walking paths during winter.  Clean up any spills in your garage right away. This includes oil or grease spills. What can I do in the bathroom?  Use night lights.  Install grab bars by the toilet and in the tub and shower. Do not use towel bars as grab bars.  Use non-skid mats or decals in the tub or shower.  If  you need to sit down in the shower, use a plastic, non-slip stool.  Keep the floor dry. Clean up any water that spills on the floor as soon as it happens.  Remove soap buildup in the tub or shower regularly.  Attach bath mats securely with double-sided non-slip rug tape.  Do not have throw rugs and other things on the floor that can make you trip. What can I do in the bedroom?  Use night lights.  Make sure that you have a light by your bed that is easy to reach.  Do not use any sheets or blankets that are too big for your bed. They should not hang down onto the floor.  Have a firm chair that has side arms. You can use this for support while you get dressed.  Do not have throw rugs and other things on the floor that can make you trip. What can I do in the kitchen?  Clean up any spills right away.  Avoid walking on wet floors.  Keep items that you use a lot in easy-to-reach places.  If you need to reach something above you, use a strong step stool that has a grab bar.  Keep electrical cords out of the way.  Do not use floor polish or wax  that makes floors slippery. If you must use wax, use non-skid floor wax.  Do not have throw rugs and other things on the floor that can make you trip. What can I do with my stairs?  Do not leave any items on the stairs.  Make sure that there are handrails on both sides of the stairs and use them. Fix handrails that are broken or loose. Make sure that handrails are as long as the stairways.  Check any carpeting to make sure that it is firmly attached to the stairs. Fix any carpet that is loose or worn.  Avoid having throw rugs at the top or bottom of the stairs. If you do have throw rugs, attach them to the floor with carpet tape.  Make sure that you have a light switch at the top of the stairs and the bottom of the stairs. If you do not have them, ask someone to add them for you. What else can I do to help prevent falls?  Wear shoes  that:  Do not have high heels.  Have rubber bottoms.  Are comfortable and fit you well.  Are closed at the toe. Do not wear sandals.  If you use a stepladder:  Make sure that it is fully opened. Do not climb a closed stepladder.  Make sure that both sides of the stepladder are locked into place.  Ask someone to hold it for you, if possible.  Clearly mark and make sure that you can see:  Any grab bars or handrails.  First and last steps.  Where the edge of each step is.  Use tools that help you move around (mobility aids) if they are needed. These include:  Canes.  Walkers.  Scooters.  Crutches.  Turn on the lights when you go into a dark area. Replace any light bulbs as soon as they burn out.  Set up your furniture so you have a clear path. Avoid moving your furniture around.  If any of your floors are uneven, fix them.  If there are any pets around you, be aware of where they are.  Review your medicines with your doctor. Some medicines can make you feel dizzy. This can increase your chance of falling. Ask your doctor what other things that you can do to help prevent falls. This information is not intended to replace advice given to you by your health care provider. Make sure you discuss any questions you have with your health care provider. Document Released: 12/10/2008 Document Revised: 07/22/2015 Document Reviewed: 03/20/2014 Elsevier Interactive Patient Education  2017 [employer].

## 2019-09-25 NOTE — Telephone Encounter (Signed)
Patient states she is still taking both medication. She is almost out of Effexor. Should she continue? She is taking Divalproex once daily. Please advise.

## 2019-09-25 NOTE — Progress Notes (Signed)
Subjective:   Jacqueline Lucas is a 69 y.o. female who presents for Medicare Annual (Subsequent) preventive examination.  Review of Systems     Cardiac Risk Factors include: advanced age (>49men, >48 women);hypertension;dyslipidemia;obesity (BMI >30kg/m2)     Objective:    Today's Vitals   09/25/19 1508  BP: 122/66  Pulse: 79  Resp: 16  Temp: (!) 96.9 F (36.1 C)  TempSrc: Temporal  SpO2: 99%  Weight: (!) 206 lb 4.8 oz (93.6 kg)  Height: 5\' 4"  (1.626 m)  PainSc: 9    Body mass index is 35.41 kg/m.  Advanced Directives 09/25/2019 06/27/2019 05/13/2019 04/29/2019 04/01/2019 09/24/2018 09/09/2018  Does Patient Have a Medical Advance Directive? No No No No No No No  Does patient want to make changes to medical advance directive? - - - - - - -  Would patient like information on creating a medical advance directive? No - Patient declined No - Patient declined No - Patient declined Yes (MAU/Ambulatory/Procedural Areas - Information given) No - Patient declined No - Patient declined No - Patient declined    Current Medications (verified) Outpatient Encounter Medications as of 09/25/2019  Medication Sig  . aspirin 81 MG chewable tablet Chew 81 mg by mouth daily.  [name] atorvastatin (LIPITOR) 40 MG tablet TAKE 1 TABLET BY MOUTH EVERYDAY AT BEDTIME  . Cholecalciferol (D3-1000) 1000 units tablet Take 1,000 Units by mouth at bedtime.   . diclofenac sodium (VOLTAREN) 1 % GEL Apply 2 g topically 4 (four) times daily.  . divalproex (DEPAKOTE) 250 MG DR tablet TAKE 1 TABLET TWICE A DAY (Patient taking differently: Take 250 mg by mouth daily. )  . estradiol (ESTRACE) 0.1 MG/GM vaginal cream APPLY A PEA SIZED AMOUNT JUST INSIDE THE VAGINAL INTROITUS WITH A FINGER TIP EVERY MONDAY, WEDNESDAY, AND FRIDAY NIGHTS  . ferrous sulfate 325 (65 FE) MG tablet Take 325 mg by mouth daily.  [name] gabapentin (NEURONTIN) 300 MG capsule TAKE 2 CAPSULES 3 TIMES A  DAY  . hydrOXYzine (VISTARIL) 25 MG capsule TAKE 1  CAPSULE 3 TIMES A   DAY AS NEEDED FOR ANXIETY  . levalbuterol (XOPENEX) 1.25 MG/3ML nebulizer solution INHALE THE CONTENTS OF 1   VIAL VIA NEBULIZER EVERY 4 HOURS AS NEEDED FOR        WHEEZING  . levothyroxine (SYNTHROID, LEVOTHROID) 50 MCG tablet Take 50 mcg by mouth daily before breakfast.  . LINZESS 145 MCG CAPS capsule TAKE 1 CAPSULE DAILY BEFOREBREAKFAST  . MAGNESIUM OXIDE PO Take 400 mg by mouth at bedtime. Reported on 07/28/2015  . methocarbamol (ROBAXIN) 500 MG tablet Take 1 tablet (500 mg total) by mouth every 8 (eight) hours as needed.  . mometasone-formoterol (DULERA) 200-5 MCG/ACT AERO Inhale 2 puffs into the lungs 2 (two) times daily.  . montelukast (SINGULAIR) 10 MG tablet TAKE 1 TABLET AT BEDTIME  . nystatin (MYCOSTATIN) 100000 UNIT/ML suspension TAKE 5 MLS (500,000 UNITS TOTAL) BY MOUTH 4 (FOUR) TIMES DAILY.  [name] olmesartan-hydrochlorothiazide (BENICAR HCT) 40-12.5 MG tablet Take 0.5 tablets by mouth daily.  [name] omega-3 acid ethyl esters (LOVAZA) 1 g capsule TAKE 2 CAPSULES TWICE DAILY  . ondansetron (ZOFRAN) 4 MG tablet Take 1 tablet (4 mg total) by mouth every 8 (eight) hours as needed for nausea or vomiting.  [name] oxyCODONE (OXY IR/ROXICODONE) 5 MG immediate release tablet Take 5 mg by mouth every 8 (eight) hours as needed.  . RESTASIS 0.05 % ophthalmic emulsion Place 1 drop into both eyes 2 (two) times daily.   [name]  traMADol (ULTRAM) 50 MG tablet Take 1 tablet (50 mg total) by mouth every 6 (six) hours as needed.  . valACYclovir (VALTREX) 1000 MG tablet Take 1 tablet (1,000 mg total) by mouth 2 (two) times daily.  [name] venlafaxine XR (EFFEXOR XR) 37.5 MG 24 hr capsule Take 1 capsule (37.5 mg total) by mouth daily with breakfast.  . vitamin B-12 (CYANOCOBALAMIN) 1000 MCG tablet Take 1,000 mcg by mouth at bedtime.  . lansoprazole (PREVACID) 30 MG capsule Take 1 capsule (30 mg total) daily by mouth. (Patient taking differently: Take 30 mg by mouth 2 (two) times daily before a meal. )  .  [DISCONTINUED] albuterol (PROAIR HFA) 108 (90 BASE) MCG/ACT inhaler Inhale 2 puffs into the lungs every 6 (six) hours as needed for wheezing or shortness of breath.   No facility-administered encounter medications on file as of 09/25/2019.    Allergies (verified) Morphine, Lyrica [pregabalin], Metformin and related, Diazepam, Latex, Rash away  [petrolatum-zinc oxide], Salicylates, and Tape   History: Past Medical History:  Diagnosis Date  . Allergy   . Allergy to adhesive tape    Allergy to paper tape as well  . Anemia   . Anginal pain ([place])   . Anxiety   . Arthritis   . Asthma   . Bronchitis   . Cataract   . Chronic nausea   . Claustrophobia   . DDD (degenerative disc disease), lumbar   . Depression   . Dry eyes    uses Restasis  . Fibromyalgia   . GERD (gastroesophageal reflux disease)   . H/O bladder infections   . Heart murmur   . Hematuria   . Hyperlipidemia   . Hypertension   . Hypothyroidism   . IBS (irritable bowel syndrome)   . IDA (iron deficiency anemia) 05/13/2019  . Insomnia   . Lumbar neuritis   . Migraines   . Obesity   . Osteoporosis   . Palpitation   . RLS (restless legs syndrome)   . Shoulder fracture, right   . [place] ([place])   . Tachycardia   . Thyroid disease   . Wears dentures    partial lower   Past Surgical History:  Procedure Laterality Date  . ABDOMINAL HYSTERECTOMY     due to cancer of ovaries  . BACK SURGERY N/A 2014   x 5  . BACK SURGERY  12/2017  . CARDIAC CATHETERIZATION     no PCI; 12/18/12 ([facility], Dr. Marcello Moores): EP study with ablation. No coronary angiography done then.  [name] CATARACT EXTRACTION W/PHACO Right 04/01/2019   Procedure: CATARACT EXTRACTION PHACO AND INTRAOCULAR LENS PLACEMENT (IOC) RIGHT  TORIC 6.19 00:40.1;  Surgeon: Birder Robson, MD;  Location: [place];  Service: Ophthalmology;  Laterality: Right;  . CATARACT EXTRACTION W/PHACO Left 04/29/2019   Procedure: CATARACT EXTRACTION PHACO AND INTRAOCULAR LENS  PLACEMENT (IOC)  LEFT TORIC LENS 4.52  00:29.1;  Surgeon: Birder Robson, MD;  Location: [place];  Service: Ophthalmology;  Laterality: Left;  . COLONOSCOPY W/ POLYPECTOMY    . COLONOSCOPY WITH PROPOFOL N/A 02/04/2016   Procedure: COLONOSCOPY WITH PROPOFOL;  Surgeon: Robert Bellow, MD;  Location: [facility] ENDOSCOPY;  Service: Endoscopy;  Laterality: N/A;  . ESOPHAGOGASTRODUODENOSCOPY (EGD) WITH PROPOFOL N/A 02/04/2016   Procedure: ESOPHAGOGASTRODUODENOSCOPY (EGD) WITH PROPOFOL;  Surgeon: Robert Bellow, MD;  Location: [facility] ENDOSCOPY;  Service: Endoscopy;  Laterality: N/A;  . FINGER ARTHROSCOPY WITH CARPOMETACARPEL ([place]) ARTHROPLASTY Left 05/01/2016   Dr. Erlinda Hong  . GIVENS CAPSULE STUDY N/A 05/02/2016  Procedure: GIVENS CAPSULE STUDY;  Surgeon: Manya Silvas, MD;  Location: [facility] ENDOSCOPY;  Service: Endoscopy;  Laterality: N/A;  . HAND SURGERY    . HEMORRHOID SURGERY    . HIP ARTHROPLASTY    . JOINT REPLACEMENT Bilateral    knee  . KNEE ARTHROPLASTY    . REPLACEMENT TOTAL KNEE Left   . SHOULDER ARTHROSCOPY Left 09/09/2018   Procedure: LEFT SHOULDER ARTHROSCOPY, BICEPS TENOTOMY;  Surgeon: Meredith Pel, MD;  Location: [place];  Service: Orthopedics;  Laterality: Left;  . SPINE SURGERY     x 2  . tendonititis     right elbow, right wrist  . TOTAL HIP ARTHROPLASTY Left 2013  . TOTAL HIP ARTHROPLASTY Right 08/16/2015   Procedure: TOTAL HIP ARTHROPLASTY ANTERIOR APPROACH;  Surgeon: Meredith Pel, MD;  Location: [place];  Service: Orthopedics;  Laterality: Right;  . TOTAL KNEE ARTHROPLASTY Right 03/16/2015   Procedure: RIGHT TOTAL KNEE ARTHROPLASTY, PCL SACRIFICING;  Surgeon: Meredith Pel, MD;  Location: [place];  Service: Orthopedics;  Laterality: Right;  . TRIGGER FINGER RELEASE  08/18/2019   Family History  Problem Relation Age of Onset  . Diabetes Mother   . Hypertension Mother   . Heart disease Mother   . Alzheimer's disease Mother   .  Diabetes Father   . Throat cancer Father   . Hypertension Sister   . Depression Sister   . Fibromyalgia Sister   . Cancer Brother   . Heart disease Brother   . Stroke Son   . Fibromyalgia Daughter   . Bladder Cancer Neg Hx   . Kidney disease Neg Hx   . Kidney cancer Neg Hx    Social History   Socioeconomic History  . Marital status: Widowed    Spouse name: Fritz Pickerel  . Number of children: 4  . Years of education: some college  . Highest education level: 12th grade  Occupational History  . Occupation: Retired  Tobacco Use  . Smoking status: Never Smoker  . Smokeless tobacco: Never Used  . Tobacco comment: smoking cessation materials not required  Vaping Use  . Vaping Use: Never used  Substance and Sexual Activity  . Alcohol use: No    Alcohol/week: 0.0 standard drinks  . Drug use: No  . Sexual activity: Never  Other Topics Concern  . Not on file  Social History Narrative   Patient has 10 grandchildren, 4 great-granddaughters & 2 great-grandsons.   Widow since 02/2018 - married for 42 years   Social Determinants of Health   Financial Resource Strain: Low Risk   . Difficulty of Paying Living Expenses: Not hard at all  Food Insecurity: No Food Insecurity  . Worried About Charity fundraiser in the Last Year: Never true  . Ran Out of Food in the Last Year: Never true  Transportation Needs: No Transportation Needs  . Lack of Transportation (Medical): No  . Lack of Transportation (Non-Medical): No  Physical Activity: Insufficiently Active  . Days of Exercise per Week: 3 days  . Minutes of Exercise per Session: 30 min  Stress: No Stress Concern Present  . Feeling of Stress : Only a little  Social Connections: Moderately Isolated  . Frequency of Communication with Friends and Family: More than three times a week  . Frequency of Social Gatherings with Friends and Family: Twice a week  . Attends Religious Services: More than 4 times per year  . Active Member of Clubs or  Organizations: No  .  Attends Archivist Meetings: Never  . Marital Status: Widowed    Tobacco Counseling Counseling given: Not Answered Comment: smoking cessation materials not required   Clinical Intake:  Pre-visit preparation completed: Yes  Pain : 0-10 Pain Score: 9  Pain Type: Chronic pain Pain Location: Back Pain Orientation: Right, Lower Pain Descriptors / Indicators: Sore, Aching, Sharp Pain Onset: More than a month ago Pain Frequency: Intermittent     BMI - recorded: 35.41 Nutritional Status: BMI > 30  Obese Nutritional Risks: None Diabetes: No  How often do you need to have someone help you when you read instructions, pamphlets, or other written materials from your doctor or pharmacy?: 1 - Never   Interpreter Needed?: No  Information entered by :: Clemetine Marker LPN   Activities of Daily Living In your present state of health, do you have any difficulty performing the following activities: 09/25/2019 08/22/2019  Hearing? N N  Comment declines hearing aids -  Vision? N N  Difficulty concentrating or making decisions? N N  Walking or climbing stairs? Y Y  Dressing or bathing? N Y  Doing errands, shopping? N N  Preparing Food and eating ? N -  Using the Toilet? N -  In the past six months, have you accidently leaked urine? N -  Do you have problems with loss of bowel control? N -  Managing your Medications? N -  Managing your Finances? N -  Housekeeping or managing your Housekeeping? N -  Some recent data might be hidden    Patient Care Team: [name], MD as PCP - General (Family Medicine) Bary Castilla, Forest Gleason, MD (General Surgery) Bo Merino, MD as Consulting Physician (Rheumatology) Providence Lanius, MD as Referring Physician (Neurology) Yolonda Kida, MD as Consulting Physician (Cardiology) Laneta Simmers as Physician Assistant (Urology) Vladimir Crofts, MD as Consulting Physician (Neurology) Marlou Sa Tonna Corner,  MD as Consulting Physician (Orthopedic Surgery) Clyde Canterbury, MD as Consulting Physician (Otolaryngology) Gabriel Carina Betsey Holiday, MD as Physician Assistant (Endocrinology) Earlie Server, MD as Consulting Physician (Oncology)  Indicate any recent [place] you may have received from other than [facility] providers in the past year (date may be approximate).     Assessment:   This is a routine wellness examination for [place].  Hearing/Vision screen  Hearing Screening   125Hz  250Hz  500Hz  1000Hz  2000Hz  3000Hz  4000Hz  6000Hz  8000Hz   Right ear:           Left ear:           Comments: Pt denies hearing difficulty  Vision Screening Comments: Annual vision screenings done at [facility]  Dietary issues and exercise activities discussed: Current Exercise Habits: Home exercise routine, Type of exercise: walking, Time (Minutes): 30, Frequency (Times/Week): 3, Weekly Exercise (Minutes/Week): 90, Intensity: Mild, Exercise limited by: orthopedic condition(s)  Goals    . DIET - Make healthy food choices     Recommend to decrease portion sizes by eating 3 small healthy meals and at least 2 healthy snacks per day.      Depression Screen PHQ 2/9 Scores 09/25/2019 08/22/2019 07/31/2019 05/05/2019 03/14/2019 01/27/2019 12/23/2018  PHQ - 2 Score 0 0 0 1 0 0 4  PHQ- 9 Score - 4 1 7 7 3 12     Fall Risk Fall Risk  09/25/2019 07/31/2019 05/05/2019 03/14/2019 01/27/2019  Falls in the past year? 1 1 0 1 1  Number falls in past yr: 0 0 0 0 0  Injury with Fall? 0  0 0 0 0  Risk Factor Category  - - - - -  Risk for fall due to : Impaired balance/gait;Impaired mobility;Orthopedic patient - - - -  Risk for fall due to: Comment - - - - -  Follow up Falls prevention discussed - - - -    Any stairs in or around the home? No If so, are there any without handrails? No  Home free of loose throw rugs in walkways, pet beds, electrical cords, etc? Yes  Adequate lighting in your home to reduce risk of falls? Yes   ASSISTIVE  DEVICES UTILIZED TO PREVENT FALLS:  Life alert? No  Use of a cane, walker or w/c? Yes  Grab bars in the bathroom? Yes  Shower chair or bench in shower? Yes  Elevated toilet seat or a handicapped toilet? Yes   TIMED UP AND GO:  Was the test performed? Yes .  Length of time to ambulate 10 feet: 7 sec.   Gait slow and steady with assistive device  Cognitive Function:     6CIT Screen 09/25/2019 08/17/2017  What Year? 0 points 0 points  What month? 0 points 0 points  What time? 0 points 0 points  Count back from 20 0 points 0 points  Months in reverse 4 points 4 points  Repeat phrase 4 points 10 points  Total Score 8 14    Immunizations Immunization History  Administered Date(s) Administered  . Fluad Quad(high Dose 65+) 10/28/2018  . Influenza, High Dose Seasonal PF 02/15/2015, 11/10/2015, 01/11/2017, 12/26/2017  . Influenza-Unspecified 02/25/2014, 12/31/2017  . Pneumococcal Conjugate-13 06/03/2013  . Pneumococcal Polysaccharide-23 09/05/2011, 01/11/2017  . Tdap 09/05/2011  . Zoster 12/12/2010    TDAP status: Up to date   Flu Vaccine status: Up to date   Pneumococcal vaccine status: Up to date   Covid-19 vaccine status: Declined, Education has been provided regarding the importance of this vaccine but patient still declined. Advised may receive this vaccine at local pharmacy or Health Dept.or vaccine clinic. Aware to provide a copy of the vaccination record if obtained from local pharmacy or Health Dept. Verbalized acceptance and understanding.  Qualifies for Shingles Vaccine? Yes   Zostavax completed Yes   Shingrix Completed?: No.    Education has been provided regarding the importance of this vaccine. Patient has been advised to call insurance company to determine out of pocket expense if they have not yet received this vaccine. Advised may also receive vaccine at local pharmacy or Health Dept. Verbalized acceptance and understanding.  Screening Tests Health  Maintenance  Topic Date Due  . COVID-19 Vaccine (1) Never done  . INFLUENZA VACCINE  09/28/2019  . MAMMOGRAM  12/06/2019  . TETANUS/TDAP  09/04/2021  . COLONOSCOPY  02/03/2026  . DEXA SCAN  Completed  . Hepatitis C Screening  Completed  . PNA vac Low Risk Adult  Completed    Health Maintenance  Health Maintenance Due  Topic Date Due  . COVID-19 Vaccine (1) Never done    Colorectal cancer screening: Completed 02/04/16. Repeat every 10 years   Mammogram status: Completed 12/06/18. Repeat every year   Bone Density status: Completed 09/25/17. Results reflect: Bone density results: NORMAL. Repeat every 2 years.  Lung Cancer Screening: (Low Dose CT Chest recommended if Age 83-80 years, 30 pack-year currently smoking OR have quit w/in 15years.) does not qualify.    Additional Screening:  Hepatitis C Screening: does qualify; Completed 04/23/12  Vision Screening: Recommended annual ophthalmology exams for early detection of glaucoma  and other disorders of the eye. Is the patient up to date with their annual eye exam?  Yes  Who is the provider or what is the name of the office in which the patient attends annual eye exams? [place] Screening: Recommended annual dental exams for proper oral hygiene  Community Resource Referral / Chronic Care Management: CRR required this visit?  No   CCM required this visit?  No      Plan:     I have personally reviewed and noted the following in the patient's chart:   . Medical and social history . Use of alcohol, tobacco or illicit drugs  . Current medications and supplements . Functional ability and status . Nutritional status . Physical activity . Advanced directives . List of other physicians . Hospitalizations, surgeries, and ER visits in previous 12 months . Vitals . Screenings to include cognitive, depression, and falls . Referrals and appointments  In addition, I have reviewed and discussed with patient  certain preventive protocols, quality metrics, and best practice recommendations. A written personalized care plan for preventive services as well as general preventive health recommendations were provided to patient.     Clemetine Marker, LPN   2/82/0601   Nurse Notes: pt doing well and appreciative of visit today.

## 2019-09-25 NOTE — Telephone Encounter (Signed)
Pls advise.  

## 2019-09-25 NOTE — Telephone Encounter (Signed)
Patient called.   She wants to know if her other hand can also be included in the nerve conduction study  Call back: [phone]

## 2019-09-25 NOTE — Telephone Encounter (Signed)
[facility]

## 2019-09-25 NOTE — Telephone Encounter (Signed)
Copied from [place] [phone]. Topic: General - Other >> Sep 25, 2019  8:43 AM Celene Kras wrote: Reason for CRM: Pt called stating that she received a call from the office. She states that she believes it was regarding her medications Venlafaxine and Divalproex. Pt is requesting to know if she should stop taking it or if there will be other adjustments made. Please advise.

## 2019-09-25 NOTE — Telephone Encounter (Signed)
E 

## 2019-09-25 NOTE — Telephone Encounter (Signed)
Sure thx

## 2019-09-26 ENCOUNTER — Ambulatory Visit (INDEPENDENT_AMBULATORY_CARE_PROVIDER_SITE_OTHER): Payer: Medicare Other | Admitting: Physical Medicine and Rehabilitation

## 2019-09-26 ENCOUNTER — Encounter: Payer: Self-pay | Admitting: Physical Medicine and Rehabilitation

## 2019-09-26 DIAGNOSIS — R202 Paresthesia of skin: Secondary | ICD-10-CM | POA: Diagnosis not present

## 2019-09-26 NOTE — Telephone Encounter (Signed)
She wants to go off of both of the medications, she said she is doing well without them.

## 2019-09-26 NOTE — Telephone Encounter (Signed)
Can you please advise if patient is scheduled? If not can we please get her scheduled for EMG/NCV BUE?

## 2019-09-26 NOTE — Progress Notes (Signed)
Tingling in both hands. Right hand is worse. Right hand dominant. No lotion per patient. Worse in the morning.  Numeric Pain Rating Scale and Functional Assessment Average Pain 9   In the last MONTH (on 0-10 scale) has pain interfered with the following?  1. General activity like being  able to carry out your everyday physical activities such as walking, climbing stairs, carrying groceries, or moving a chair?  Rating(0)

## 2019-09-29 NOTE — Telephone Encounter (Signed)
Patient called.  Patient aware.  

## 2019-09-29 NOTE — Telephone Encounter (Signed)
Patient had bilateral upper extremity on 7/30.

## 2019-09-29 NOTE — Procedures (Signed)
Patient History / Exam:   EMG & NCV Findings: Evaluation of the left median motor and the right ulnar sensory nerves showed reduced amplitude (L2.7, R6.2 V).  The right median motor nerve showed decreased conduction velocity (Elbow-Wrist, 49 m/s).  All remaining nerves (as indicated in the following tables) were within normal limits.  All left vs. right side differences were within normal limits.    All examined muscles (as indicated in the following table) showed no evidence of electrical instability.    Impression: Essentially NORMAL electrodiagnostic study of both upper limbs.  There is no significant electrodiagnostic evidence of nerve entrapment, brachial plexopathy or cervical radiculopathy.    As you know, purely sensory or demyelinating radiculopathies and chemical radiculitis may not be detected with this particular electrodiagnostic study. **This electrodiagnostic study cannot rule out small fiber polyneuropathy and dysesthesias from central pain syndromes such as stroke or central pain sensitization syndromes such as fibromyalgia.  Myotomal referral pain from trigger points is also not excluded.  Recommendations: 1.  Follow-up with referring physician. 2.  Continue current management of symptoms.  ___________________________ Laurence Spates FAAPMR Board Certified, American Board of Physical Medicine and Rehabilitation    Nerve Conduction Studies Anti Sensory Summary Table   Stim Site NR Peak (ms) Norm Peak (ms) P-T Amp (V) Norm P-T Amp Site1 Site2 Delta-P (ms) Dist (cm) Vel (m/s) Norm Vel (m/s)  Left Median Acr Palm Anti Sensory (2nd Digit)  33C  Wrist    3.2 <3.6 25.8 >10 Wrist Palm 1.4 0.0    Palm    1.8 <2.0 12.6         Right Median Acr Palm Anti Sensory (2nd Digit)  32.5C  Wrist    3.2 <3.6 25.3 >10 Wrist Palm 1.4 0.0    Palm    1.8 <2.0 20.8         Right Radial Anti Sensory (Base 1st Digit)  32.3C  Wrist    2.1 <3.1 31.9  Wrist Base 1st Digit 2.1 0.0    Right  Ulnar Anti Sensory (5th Digit)  32.9C  Wrist    3.3 <3.7 *6.2 >15.0 Wrist 5th Digit 3.3 14.0 42 >38   Motor Summary Table   Stim Site NR Onset (ms) Norm Onset (ms) O-P Amp (mV) Norm O-P Amp Site1 Site2 Delta-0 (ms) Dist (cm) Vel (m/s) Norm Vel (m/s)  Left Median Motor (Abd Poll Brev)  33.4C  Wrist    3.7 <4.2 *2.7 >5 Elbow Wrist 3.8 20.0 53 >50  Elbow    7.5  2.3         Right Median Motor (Abd Poll Brev)  32.8C  Wrist    3.6 <4.2 5.0 >5 Elbow Wrist 4.2 20.5 *49 >50  Elbow    7.8  4.0         Right Ulnar Motor (Abd Dig Min)  33.4C  Wrist    2.9 <4.2 7.7 >3 B Elbow Wrist 3.3 17.5 53 >53  B Elbow    6.2  8.0  A Elbow B Elbow 1.8 10.0 56 >53  A Elbow    8.0  7.8          EMG   Side Muscle Nerve Root Ins Act Fibs Psw Amp Dur Poly Recrt Int Fraser Din Comment  Right Abd Poll Brev Median C8-T1 Nml Nml Nml Nml Nml 0 Nml Nml   Right 1stDorInt Ulnar C8-T1 Nml Nml Nml Nml Nml 0 Nml Nml   Right PronatorTeres Median C6-7 Nml Nml Nml Nml  Nml 0 Nml Nml   Right Biceps Musculocut C5-6 Nml Nml Nml Nml Nml 0 Nml Nml   Right Deltoid Axillary C5-6 Nml Nml Nml Nml Nml 0 Nml Nml     Nerve Conduction Studies Anti Sensory Left/Right Comparison   Stim Site L Lat (ms) R Lat (ms) L-R Lat (ms) L Amp (V) R Amp (V) L-R Amp (%) Site1 Site2 L Vel (m/s) R Vel (m/s) L-R Vel (m/s)  Median Acr Palm Anti Sensory (2nd Digit)  33C  Wrist 3.2 3.2 0.0 25.8 25.3 1.9 Wrist Palm     Palm 1.8 1.8 0.0 12.6 20.8 39.4       Radial Anti Sensory (Base 1st Digit)  32.3C  Wrist  2.1   31.9  Wrist Base 1st Digit     Ulnar Anti Sensory (5th Digit)  32.9C  Wrist  3.3   *6.2  Wrist 5th Digit  42    Motor Left/Right Comparison   Stim Site L Lat (ms) R Lat (ms) L-R Lat (ms) L Amp (mV) R Amp (mV) L-R Amp (%) Site1 Site2 L Vel (m/s) R Vel (m/s) L-R Vel (m/s)  Median Motor (Abd Poll Brev)  33.4C  Wrist 3.7 3.6 0.1 *2.7 5.0 46.0 Elbow Wrist 53 *49 4  Elbow 7.5 7.8 0.3 2.3 4.0 42.5       Ulnar Motor (Abd Dig Min)  33.4C  Wrist   2.9   7.7  B Elbow Wrist  53   B Elbow  6.2   8.0  A Elbow B Elbow  56   A Elbow  8.0   7.8           Waveforms:

## 2019-09-30 NOTE — Progress Notes (Signed)
Jacqueline Lucas - 69 y.o. female MRN [id number]  Date of birth: [birth year]  Office Visit Note: Visit Date: 09/26/2019 PCP: [name], MD Referred by: [name], MD  Subjective: Chief Complaint  Patient presents with  . Right Hand - Numbness  . Left Hand - Numbness   HPI: Jacqueline Lucas is a 68 y.o. female who comes in today at the request of Dr. Anderson Malta for electrodiagnostic study of the Bilateral upper extremities.  Patient is Right hand dominant.  Patient had prior electrodiagnostic study in May 2018 on the left hand.  This was done in our office and was read as normal and is reviewed again below and is in the notes.  She comes in today with history of left to digit trigger finger surgery by Dr. Marlou Sa recently as well as injection in the right [facility] joint.  She reports tingling in both hands right worse than left.  She reports worsening in the mornings more than nighttime.  Rates her pain is 9 out of 10.  She reports worsening with activity.  She does carry a diagnosis of fibromyalgia and is treated by Dr. Estanislado Pandy.  Hemoglobin A1c 4 months ago was 6.3.  ROS Otherwise per HPI.  Assessment & Plan: Visit Diagnoses:  1. Paresthesia of skin     Plan: Impression: Essentially NORMAL electrodiagnostic study of both upper limbs.  There is no significant electrodiagnostic evidence of nerve entrapment, brachial plexopathy or cervical radiculopathy.    As you know, purely sensory or demyelinating radiculopathies and chemical radiculitis may not be detected with this particular electrodiagnostic study. **This electrodiagnostic study cannot rule out small fiber polyneuropathy and dysesthesias from central pain syndromes such as stroke or central pain sensitization syndromes such as fibromyalgia.  Myotomal referral pain from trigger points is also not excluded.  Recommendations: 1.  Follow-up with referring physician. 2.  Continue current management of symptoms.  Meds &  Orders: No orders of the defined types were placed in this encounter.   Orders Placed This Encounter  Procedures  . NCV with EMG (electromyography)    Follow-up: Return in about 2 weeks (around 10/10/2019) for  Anderson Malta, M.D..   Procedures: No procedures performed  Patient History / Exam:   EMG & NCV Findings: Evaluation of the left median motor and the right ulnar sensory nerves showed reduced amplitude (L2.7, R6.2 V).  The right median motor nerve showed decreased conduction velocity (Elbow-Wrist, 49 m/s).  All remaining nerves (as indicated in the following tables) were within normal limits.  All left vs. right side differences were within normal limits.    All examined muscles (as indicated in the following table) showed no evidence of electrical instability.    Impression: Essentially NORMAL electrodiagnostic study of both upper limbs.  There is no significant electrodiagnostic evidence of nerve entrapment, brachial plexopathy or cervical radiculopathy.    As you know, purely sensory or demyelinating radiculopathies and chemical radiculitis may not be detected with this particular electrodiagnostic study. **This electrodiagnostic study cannot rule out small fiber polyneuropathy and dysesthesias from central pain syndromes such as stroke or central pain sensitization syndromes such as fibromyalgia.  Myotomal referral pain from trigger points is also not excluded.  Recommendations: 1.  Follow-up with referring physician. 2.  Continue current management of symptoms.  ___________________________ [employer] Board Certified, American Board of Physical Medicine and Rehabilitation    Nerve Conduction Studies Anti Sensory Summary Table   Stim Site NR Peak (ms) Norm  Peak (ms) P-T Amp (V) Norm P-T Amp Site1 Site2 Delta-P (ms) Dist (cm) Vel (m/s) Norm Vel (m/s)  Left Median Acr Palm Anti Sensory (2nd Digit)  33C  Wrist    3.2 <3.6 25.8 >10 Wrist Palm 1.4 0.0    Palm     1.8 <2.0 12.6         Right Median Acr Palm Anti Sensory (2nd Digit)  32.5C  Wrist    3.2 <3.6 25.3 >10 Wrist Palm 1.4 0.0    Palm    1.8 <2.0 20.8         Right Radial Anti Sensory (Base 1st Digit)  32.3C  Wrist    2.1 <3.1 31.9  Wrist Base 1st Digit 2.1 0.0    Right Ulnar Anti Sensory (5th Digit)  32.9C  Wrist    3.3 <3.7 *6.2 >15.0 Wrist 5th Digit 3.3 14.0 42 >38   Motor Summary Table   Stim Site NR Onset (ms) Norm Onset (ms) O-P Amp (mV) Norm O-P Amp Site1 Site2 Delta-0 (ms) Dist (cm) Vel (m/s) Norm Vel (m/s)  Left Median Motor (Abd Poll Brev)  33.4C  Wrist    3.7 <4.2 *2.7 >5 Elbow Wrist 3.8 20.0 53 >50  Elbow    7.5  2.3         Right Median Motor (Abd Poll Brev)  32.8C  Wrist    3.6 <4.2 5.0 >5 Elbow Wrist 4.2 20.5 *49 >50  Elbow    7.8  4.0         Right Ulnar Motor (Abd Dig Min)  33.4C  Wrist    2.9 <4.2 7.7 >3 B Elbow Wrist 3.3 17.5 53 >53  B Elbow    6.2  8.0  A Elbow B Elbow 1.8 10.0 56 >53  A Elbow    8.0  7.8          EMG   Side Muscle Nerve Root Ins Act Fibs Psw Amp Dur Poly Recrt Int Fraser Din Comment  Right Abd Poll Brev Median C8-T1 Nml Nml Nml Nml Nml 0 Nml Nml   Right 1stDorInt Ulnar C8-T1 Nml Nml Nml Nml Nml 0 Nml Nml   Right PronatorTeres Median C6-7 Nml Nml Nml Nml Nml 0 Nml Nml   Right Biceps Musculocut C5-6 Nml Nml Nml Nml Nml 0 Nml Nml   Right Deltoid Axillary C5-6 Nml Nml Nml Nml Nml 0 Nml Nml     Nerve Conduction Studies Anti Sensory Left/Right Comparison   Stim Site L Lat (ms) R Lat (ms) L-R Lat (ms) L Amp (V) R Amp (V) L-R Amp (%) Site1 Site2 L Vel (m/s) R Vel (m/s) L-R Vel (m/s)  Median Acr Palm Anti Sensory (2nd Digit)  33C  Wrist 3.2 3.2 0.0 25.8 25.3 1.9 Wrist Palm     Palm 1.8 1.8 0.0 12.6 20.8 39.4       Radial Anti Sensory (Base 1st Digit)  32.3C  Wrist  2.1   31.9  Wrist Base 1st Digit     Ulnar Anti Sensory (5th Digit)  32.9C  Wrist  3.3   *6.2  Wrist 5th Digit  42    Motor Left/Right Comparison   Stim Site L Lat (ms) R Lat  (ms) L-R Lat (ms) L Amp (mV) R Amp (mV) L-R Amp (%) Site1 Site2 L Vel (m/s) R Vel (m/s) L-R Vel (m/s)  Median Motor (Abd Poll Brev)  33.4C  Wrist 3.7 3.6 0.1 *2.7 5.0 46.0 Elbow Wrist 53 *49 4  Elbow 7.5 7.8 0.3 2.3 4.0 42.5       Ulnar Motor (Abd Dig Min)  33.4C  Wrist  2.9   7.7  B Elbow Wrist  53   B Elbow  6.2   8.0  A Elbow B Elbow  56   A Elbow  8.0   7.8           Waveforms:                 Clinical History: 07/14/2016 EMG/NCS - Laurence Spates, MD  Impression: Essentially NORMAL electrodiagnostic study of the left upper limb.  There is no significant electrodiagnostic evidence of nerve entrapment, brachial plexopathy or cervical radiculopathy.    As you know, purely sensory or demyelinating radiculopathies and chemical radiculitis may not be detected with this particular electrodiagnostic study.   She reports that she has never smoked. She has never used smokeless tobacco.  Recent Labs    10/28/18 0000 05/05/19 1202  HGBA1C 6.2* 6.3*    Objective:  VS:  HT:    WT:   BMI:     BP:   HR: bpm  TEMP: ( )  RESP:  Physical Exam Musculoskeletal:        General: No swelling, tenderness or deformity.     Comments: Inspection reveals well-healed trigger finger surgical scar on the left as well as CMC arthritis bilaterally but no atrophy of the bilateral APB or FDI or hand intrinsics. There is no swelling, color changes, allodynia or dystrophic changes. There is 5 out of 5 strength in the bilateral wrist extension, finger abduction and long finger flexion. There is intact sensation to light touch in all dermatomal and peripheral nerve distributions. There is a negative Hoffmann's test bilaterally.  Skin:    General: Skin is warm and dry.     Findings: No erythema or rash.  Neurological:     General: No focal deficit present.     Mental Status: She is alert and oriented to person, place, and time.     Motor: No weakness or abnormal muscle tone.     Coordination:  Coordination normal.  Psychiatric:        Mood and Affect: Mood normal.        Behavior: Behavior normal.     Ortho Exam  Imaging: No results found.  Past Medical/Family/Surgical/Social History: Medications & Allergies reviewed per EMR, new medications updated. Patient Active Problem List   Diagnosis Date Noted  . IDA (iron deficiency anemia) 05/13/2019  . Superior glenoid labrum lesion of left shoulder   . Lumbar stenosis 02/04/2018  . Hx of hysterectomy 08/13/2017  . Pain in left wrist 06/12/2017  . Chronic venous insufficiency 06/09/2017  . Varicose veins of both lower extremities with pain 04/30/2017  . Claudication ([place]) 04/30/2017  . Arthritis of carpometacarpal [facility]) joint of left thumb 05/12/2016  . Bilateral thumb pain 05/01/2016  . Anemia 02/03/2016  . Status post bilateral total hip replacement 01/10/2016  . Sicca ([place]) 01/10/2016  . Age related osteoporosis 01/10/2016  . Hip arthritis 08/16/2015  . Microscopic hematuria 07/30/2015  . Vaginal atrophy 07/30/2015  . GAD (generalized anxiety disorder) 07/08/2015  . Moderate episode of recurrent major depressive disorder ([place]) 07/08/2015  . Migraine with aura and without status migrainosus, not intractable 07/08/2015  . Dyslipidemia 07/08/2015  . Degenerative arthritis of right knee 03/16/2015  . Atrophic vaginitis 01/31/2015  . Recurrent UTI 12/31/2014  . IBS (irritable bowel syndrome) 10/29/2014  . Asthma, moderate persistent  09/08/2014  . Allergic state 09/07/2014  . Colon polyp 09/07/2014  . Hemorrhoid 09/07/2014  . Constrictive tenosynovitis 03/12/2014  . H/O total knee replacement, bilateral 07/31/2013  . CAD (coronary artery disease) 11/28/2012  . Anxiety 11/28/2012  . Acid reflux 11/28/2012  . Cardiac murmur 11/28/2012  . BP (high blood pressure) 11/28/2012  . Cannot sleep 11/28/2012  . Adiposity 11/28/2012  . Restless leg 11/28/2012  . Supraventricular tachycardia ([place]) 11/28/2012  . Fibromyalgia  11/28/2012  . Tachycardia 11/28/2012  . Anginal pain ([place]) 11/28/2012   Past Medical History:  Diagnosis Date  . Allergy   . Allergy to adhesive tape    Allergy to paper tape as well  . Anemia   . Anginal pain ([place])   . Anxiety   . Arthritis   . Asthma   . Bronchitis   . Cataract   . Chronic nausea   . Claustrophobia   . DDD (degenerative disc disease), lumbar   . Depression   . Dry eyes    uses Restasis  . Fibromyalgia   . GERD (gastroesophageal reflux disease)   . H/O bladder infections   . Heart murmur   . Hematuria   . Hyperlipidemia   . Hypertension   . Hypothyroidism   . IBS (irritable bowel syndrome)   . IDA (iron deficiency anemia) 05/13/2019  . Insomnia   . Lumbar neuritis   . Migraines   . Obesity   . Osteoporosis   . Palpitation   . RLS (restless legs syndrome)   . Shoulder fracture, right   . Sicca ([place])   . Tachycardia   . Thyroid disease   . Wears dentures    partial lower   Family History  Problem Relation Age of Onset  . Diabetes Mother   . Hypertension Mother   . Heart disease Mother   . Alzheimer's disease Mother   . Diabetes Father   . Throat cancer Father   . Hypertension Sister   . Depression Sister   . Fibromyalgia Sister   . Cancer Brother   . Heart disease Brother   . Stroke Son   . Fibromyalgia Daughter   . Bladder Cancer Neg Hx   . Kidney disease Neg Hx   . Kidney cancer Neg Hx    Past Surgical History:  Procedure Laterality Date  . ABDOMINAL HYSTERECTOMY     due to cancer of ovaries  . BACK SURGERY N/A 2014   x 5  . BACK SURGERY  12/2017  . CARDIAC CATHETERIZATION     no PCI; 12/18/12 ([facility], Dr. Marcello Moores): EP study with ablation. No coronary angiography done then.  [name] CATARACT EXTRACTION W/PHACO Right 04/01/2019   Procedure: CATARACT EXTRACTION PHACO AND INTRAOCULAR LENS PLACEMENT (IOC) RIGHT  TORIC 6.19 00:40.1;  Surgeon: Birder Robson, MD;  Location: [place];  Service: Ophthalmology;  Laterality: Right;    . CATARACT EXTRACTION W/PHACO Left 04/29/2019   Procedure: CATARACT EXTRACTION PHACO AND INTRAOCULAR LENS PLACEMENT (IOC)  LEFT TORIC LENS 4.52  00:29.1;  Surgeon: Birder Robson, MD;  Location: [place];  Service: Ophthalmology;  Laterality: Left;  . COLONOSCOPY W/ POLYPECTOMY    . COLONOSCOPY WITH PROPOFOL N/A 02/04/2016   Procedure: COLONOSCOPY WITH PROPOFOL;  Surgeon: Robert Bellow, MD;  Location[facility] ENDOSCOPY;  Service: Endoscopy;  Laterality: N/A;  . ESOPHAGOGASTRODUODENOSCOPY (EGD) WITH PROPOFOL N/A 02/04/2016   Procedure: ESOPHAGOGASTRODUODENOSCOPY (EGD) WITH PROPOFOL;  Surgeon: Robert Bellow, MD;  Location: [facility] ENDOSCOPY;  Service: Endoscopy;  Laterality:  N/A;  . FINGER ARTHROSCOPY WITH CARPOMETACARPEL ([place]) ARTHROPLASTY Left 05/01/2016   Dr. Erlinda Hong  . GIVENS CAPSULE STUDY N/A 05/02/2016   Procedure: GIVENS CAPSULE STUDY;  Surgeon: Manya Silvas, MD;  Location: [facility] ENDOSCOPY;  Service: Endoscopy;  Laterality: N/A;  . HAND SURGERY    . HEMORRHOID SURGERY    . HIP ARTHROPLASTY    . JOINT REPLACEMENT Bilateral    knee  . KNEE ARTHROPLASTY    . REPLACEMENT TOTAL KNEE Left   . SHOULDER ARTHROSCOPY Left 09/09/2018   Procedure: LEFT SHOULDER ARTHROSCOPY, BICEPS TENOTOMY;  Surgeon: Meredith Pel, MD;  Location: [place];  Service: Orthopedics;  Laterality: Left;  . SPINE SURGERY     x 2  . tendonititis     right elbow, right wrist  . TOTAL HIP ARTHROPLASTY Left 2013  . TOTAL HIP ARTHROPLASTY Right 08/16/2015   Procedure: TOTAL HIP ARTHROPLASTY ANTERIOR APPROACH;  Surgeon: Meredith Pel, MD;  Location: [place];  Service: Orthopedics;  Laterality: Right;  . TOTAL KNEE ARTHROPLASTY Right 03/16/2015   Procedure: RIGHT TOTAL KNEE ARTHROPLASTY, PCL SACRIFICING;  Surgeon: Meredith Pel, MD;  Location: [place];  Service: Orthopedics;  Laterality: Right;  . TRIGGER FINGER RELEASE  08/18/2019   Social History   Occupational History  .  Occupation: Retired  Tobacco Use  . Smoking status: Never Smoker  . Smokeless tobacco: Never Used  . Tobacco comment: smoking cessation materials not required  Vaping Use  . Vaping Use: Never used  Substance and Sexual Activity  . Alcohol use: No    Alcohol/week: 0.0 standard drinks  . Drug use: No  . Sexual activity: Never

## 2019-10-02 ENCOUNTER — Telehealth: Payer: Self-pay | Admitting: Orthopedic Surgery

## 2019-10-02 NOTE — Telephone Encounter (Signed)
Please advise 

## 2019-10-02 NOTE — Telephone Encounter (Signed)
Patient called stating her fingers on her left hand is hurting and they burn. Patient said the fingers she had surgery on. The number to contact patient is [phone]

## 2019-10-03 NOTE — Telephone Encounter (Signed)
I called.

## 2019-10-08 ENCOUNTER — Ambulatory Visit (INDEPENDENT_AMBULATORY_CARE_PROVIDER_SITE_OTHER): Payer: Medicare Other | Admitting: Orthopedic Surgery

## 2019-10-08 ENCOUNTER — Ambulatory Visit: Payer: Medicare Other | Admitting: Orthopedic Surgery

## 2019-10-08 DIAGNOSIS — M6530 Trigger finger, unspecified finger: Secondary | ICD-10-CM

## 2019-10-09 ENCOUNTER — Encounter: Payer: Self-pay | Admitting: Orthopedic Surgery

## 2019-10-09 NOTE — Progress Notes (Signed)
Post-Op Visit Note   Patient: Jacqueline Lucas           Date of Birth: Oct 29, 1950           MRN: [id number] Visit Date: 10/08/2019 PCP: [name], MD   Assessment & Plan:  Chief Complaint:  Chief Complaint  Patient presents with  . Follow-up   Visit Diagnoses:  1. Stenosing tenosynovitis of finger     Plan: Jacqueline Lucas is a 69 year old patient here for follow-up of trigger finger release.  She is doing reasonably well from that.  Some of the burning has resolved.  EMG nerve study was also normal for any type of carpal tunnel syndrome.  She is having some axial spine pain and has a history of cervical spine surgery.  Plan at this time is to continue with hand range of motion exercises.  No further work-up indicated on her hand numbness at this time.  Follow-up as needed.  Follow-Up Instructions: Return if symptoms worsen or fail to improve.   Orders:  No orders of the defined types were placed in this encounter.  No orders of the defined types were placed in this encounter.   Imaging: No results found.  PMFS History: Patient Active Problem List   Diagnosis Date Noted  . IDA (iron deficiency anemia) 05/13/2019  . Superior glenoid labrum lesion of left shoulder   . Lumbar stenosis 02/04/2018  . Hx of hysterectomy 08/13/2017  . Pain in left wrist 06/12/2017  . Chronic venous insufficiency 06/09/2017  . Varicose veins of both lower extremities with pain 04/30/2017  . Claudication ([place]) 04/30/2017  . Arthritis of carpometacarpal [facility]) joint of left thumb 05/12/2016  . Bilateral thumb pain 05/01/2016  . Anemia 02/03/2016  . Status post bilateral total hip replacement 01/10/2016  . Sicca ([place]) 01/10/2016  . Age related osteoporosis 01/10/2016  . Hip arthritis 08/16/2015  . Microscopic hematuria 07/30/2015  . Vaginal atrophy 07/30/2015  . GAD (generalized anxiety disorder) 07/08/2015  . Moderate episode of recurrent major depressive disorder ([place]) 07/08/2015  .  Migraine with aura and without status migrainosus, not intractable 07/08/2015  . Dyslipidemia 07/08/2015  . Degenerative arthritis of right knee 03/16/2015  . Atrophic vaginitis 01/31/2015  . Recurrent UTI 12/31/2014  . IBS (irritable bowel syndrome) 10/29/2014  . Asthma, moderate persistent 09/08/2014  . Allergic state 09/07/2014  . Colon polyp 09/07/2014  . Hemorrhoid 09/07/2014  . Constrictive tenosynovitis 03/12/2014  . H/O total knee replacement, bilateral 07/31/2013  . CAD (coronary artery disease) 11/28/2012  . Anxiety 11/28/2012  . Acid reflux 11/28/2012  . Cardiac murmur 11/28/2012  . BP (high blood pressure) 11/28/2012  . Cannot sleep 11/28/2012  . Adiposity 11/28/2012  . Restless leg 11/28/2012  . Supraventricular tachycardia ([place]) 11/28/2012  . Fibromyalgia 11/28/2012  . Tachycardia 11/28/2012  . Anginal pain ([place]) 11/28/2012   Past Medical History:  Diagnosis Date  . Allergy   . Allergy to adhesive tape    Allergy to paper tape as well  . Anemia   . Anginal pain ([place])   . Anxiety   . Arthritis   . Asthma   . Bronchitis   . Cataract   . Chronic nausea   . Claustrophobia   . DDD (degenerative disc disease), lumbar   . Depression   . Dry eyes    uses Restasis  . Fibromyalgia   . GERD (gastroesophageal reflux disease)   . H/O bladder infections   . Heart murmur   . Hematuria   .  Hyperlipidemia   . Hypertension   . Hypothyroidism   . IBS (irritable bowel syndrome)   . IDA (iron deficiency anemia) 05/13/2019  . Insomnia   . Lumbar neuritis   . Migraines   . Obesity   . Osteoporosis   . Palpitation   . RLS (restless legs syndrome)   . Shoulder fracture, right   . Sicca ([place])   . Tachycardia   . Thyroid disease   . Wears dentures    partial lower    Family History  Problem Relation Age of Onset  . Diabetes Mother   . Hypertension Mother   . Heart disease Mother   . Alzheimer's disease Mother   . Diabetes Father   . Throat cancer Father     . Hypertension Sister   . Depression Sister   . Fibromyalgia Sister   . Cancer Brother   . Heart disease Brother   . Stroke Son   . Fibromyalgia Daughter   . Bladder Cancer Neg Hx   . Kidney disease Neg Hx   . Kidney cancer Neg Hx     Past Surgical History:  Procedure Laterality Date  . ABDOMINAL HYSTERECTOMY     due to cancer of ovaries  . BACK SURGERY N/A 2014   x 5  . BACK SURGERY  12/2017  . CARDIAC CATHETERIZATION     no PCI; 12/18/12 ([facility], Dr. Marcello Moores): EP study with ablation. No coronary angiography done then.  [name] CATARACT EXTRACTION W/PHACO Right 04/01/2019   Procedure: CATARACT EXTRACTION PHACO AND INTRAOCULAR LENS PLACEMENT (IOC) RIGHT  TORIC 6.19 00:40.1;  Surgeon: Birder Robson, MD;  Location: [place];  Service: Ophthalmology;  Laterality: Right;  . CATARACT EXTRACTION W/PHACO Left 04/29/2019   Procedure: CATARACT EXTRACTION PHACO AND INTRAOCULAR LENS PLACEMENT (IOC)  LEFT TORIC LENS 4.52  00:29.1;  Surgeon: Birder Robson, MD;  Location: [place];  Service: Ophthalmology;  Laterality: Left;  . COLONOSCOPY W/ POLYPECTOMY    . COLONOSCOPY WITH PROPOFOL N/A 02/04/2016   Procedure: COLONOSCOPY WITH PROPOFOL;  Surgeon: Robert Bellow, MD;  Location: [facility] ENDOSCOPY;  Service: Endoscopy;  Laterality: N/A;  . ESOPHAGOGASTRODUODENOSCOPY (EGD) WITH PROPOFOL N/A 02/04/2016   Procedure: ESOPHAGOGASTRODUODENOSCOPY (EGD) WITH PROPOFOL;  Surgeon: Robert Bellow, MD;  Location: [facility] ENDOSCOPY;  Service: Endoscopy;  Laterality: N/A;  . FINGER ARTHROSCOPY WITH CARPOMETACARPEL ([place]) ARTHROPLASTY Left 05/01/2016   Dr. Erlinda Hong  . GIVENS CAPSULE STUDY N/A 05/02/2016   Procedure: GIVENS CAPSULE STUDY;  Surgeon: Manya Silvas, MD;  Location: [facility] ENDOSCOPY;  Service: Endoscopy;  Laterality: N/A;  . HAND SURGERY    . HEMORRHOID SURGERY    . HIP ARTHROPLASTY    . JOINT REPLACEMENT Bilateral    knee  . KNEE ARTHROPLASTY    . REPLACEMENT TOTAL KNEE Left   .  SHOULDER ARTHROSCOPY Left 09/09/2018   Procedure: LEFT SHOULDER ARTHROSCOPY, BICEPS TENOTOMY;  Surgeon: Meredith Pel, MD;  Location: [place];  Service: Orthopedics;  Laterality: Left;  . SPINE SURGERY     x 2  . tendonititis     right elbow, right wrist  . TOTAL HIP ARTHROPLASTY Left 2013  . TOTAL HIP ARTHROPLASTY Right 08/16/2015   Procedure: TOTAL HIP ARTHROPLASTY ANTERIOR APPROACH;  Surgeon: Meredith Pel, MD;  Location: [place];  Service: Orthopedics;  Laterality: Right;  . TOTAL KNEE ARTHROPLASTY Right 03/16/2015   Procedure: RIGHT TOTAL KNEE ARTHROPLASTY, PCL SACRIFICING;  Surgeon: Meredith Pel, MD;  Location: [place];  Service:  Orthopedics;  Laterality: Right;  . TRIGGER FINGER RELEASE  08/18/2019   Social History   Occupational History  . Occupation: Retired  Tobacco Use  . Smoking status: Never Smoker  . Smokeless tobacco: Never Used  . Tobacco comment: smoking cessation materials not required  Vaping Use  . Vaping Use: Never used  Substance and Sexual Activity  . Alcohol use: No    Alcohol/week: 0.0 standard drinks  . Drug use: No  . Sexual activity: Never

## 2019-10-14 ENCOUNTER — Emergency Department: Payer: Medicare Other

## 2019-10-14 ENCOUNTER — Other Ambulatory Visit: Payer: Self-pay

## 2019-10-14 ENCOUNTER — Encounter: Payer: Self-pay | Admitting: Emergency Medicine

## 2019-10-14 DIAGNOSIS — R197 Diarrhea, unspecified: Secondary | ICD-10-CM | POA: Insufficient documentation

## 2019-10-14 DIAGNOSIS — R0902 Hypoxemia: Secondary | ICD-10-CM | POA: Diagnosis not present

## 2019-10-14 DIAGNOSIS — R531 Weakness: Secondary | ICD-10-CM | POA: Diagnosis not present

## 2019-10-14 DIAGNOSIS — R110 Nausea: Secondary | ICD-10-CM | POA: Diagnosis not present

## 2019-10-14 DIAGNOSIS — R5381 Other malaise: Secondary | ICD-10-CM | POA: Diagnosis not present

## 2019-10-14 DIAGNOSIS — Z5321 Procedure and treatment not carried out due to patient leaving prior to being seen by health care provider: Secondary | ICD-10-CM | POA: Insufficient documentation

## 2019-10-14 DIAGNOSIS — R509 Fever, unspecified: Secondary | ICD-10-CM | POA: Diagnosis not present

## 2019-10-14 DIAGNOSIS — R5383 Other fatigue: Secondary | ICD-10-CM | POA: Diagnosis not present

## 2019-10-14 DIAGNOSIS — J90 Pleural effusion, not elsewhere classified: Secondary | ICD-10-CM | POA: Diagnosis not present

## 2019-10-14 LAB — URINALYSIS, ROUTINE W REFLEX MICROSCOPIC
Bilirubin Urine: NEGATIVE
Glucose, UA: NEGATIVE mg/dL
Hgb urine dipstick: NEGATIVE
Ketones, ur: NEGATIVE mg/dL
Leukocytes,Ua: NEGATIVE
Nitrite: NEGATIVE
Protein, ur: 30 mg/dL — AB
Specific Gravity, Urine: 1.018 (ref 1.005–1.030)
pH: 6 (ref 5.0–8.0)

## 2019-10-14 LAB — CBC
HCT: 40.3 % (ref 36.0–46.0)
Hemoglobin: 14.5 g/dL (ref 12.0–15.0)
MCH: 31.9 pg (ref 26.0–34.0)
MCHC: 36 g/dL (ref 30.0–36.0)
MCV: 88.6 fL (ref 80.0–100.0)
Platelets: 134 10*3/uL — ABNORMAL LOW (ref 150–400)
RBC: 4.55 MIL/uL (ref 3.87–5.11)
RDW: 13.2 % (ref 11.5–15.5)
WBC: 4.1 10*3/uL (ref 4.0–10.5)
nRBC: 0 % (ref 0.0–0.2)

## 2019-10-14 LAB — BASIC METABOLIC PANEL
Anion gap: 9 (ref 5–15)
BUN: 8 mg/dL (ref 8–23)
CO2: 24 mmol/L (ref 22–32)
Calcium: 8.4 mg/dL — ABNORMAL LOW (ref 8.9–10.3)
Chloride: 102 mmol/L (ref 98–111)
Creatinine, Ser: 0.57 mg/dL (ref 0.44–1.00)
GFR calc Af Amer: >60 mL/min (ref >60)
GFR calc non Af Amer: >60 mL/min (ref >60)
Glucose, Bld: 145 mg/dL — ABNORMAL HIGH (ref 70–99)
Potassium: 3.6 mmol/L (ref 3.5–5.1)
Sodium: 135 mmol/L (ref 135–145)

## 2019-10-14 NOTE — ED Triage Notes (Signed)
Pt to ED from home via EMS c/o of malaise, nausea but no vomiting, and diarrhea x1 today for the last four days.  Pt denies new pain.  States temp of 100.2 a couple days ago, afebrile at this time.  Denies loss of taste or smell but decreased appetite.  Pt A&Ox4, chest rise even and unlabored, in NAD at this time.

## 2019-10-15 ENCOUNTER — Emergency Department
Admission: EM | Admit: 2019-10-15 | Discharge: 2019-10-15 | Disposition: A | Discharge status: Home / Self Care | Payer: Medicare Other | Attending: Emergency Medicine | Admitting: Emergency Medicine

## 2019-10-16 ENCOUNTER — Telehealth: Payer: Self-pay | Admitting: Family Medicine

## 2019-10-16 NOTE — Telephone Encounter (Signed)
LVM informing patient to get tested for COVID per Dr. Ancil Boozer today.  Gave patient the [facility] in [place], [place] location to get tested and to give our office a call with the results. Call us back with the results and if she starts experiencing shortness of breath or chest pain to go to the ER.

## 2019-10-17 ENCOUNTER — Inpatient Hospital Stay (HOSPITAL_COMMUNITY)
Admission: EM | Admit: 2019-10-17 | Discharge: 2019-11-28 | DRG: 207 | Disposition: E | Payer: Medicare Other | Attending: Pulmonary Disease | Admitting: Pulmonary Disease

## 2019-10-17 ENCOUNTER — Inpatient Hospital Stay (HOSPITAL_COMMUNITY): Payer: Medicare Other

## 2019-10-17 ENCOUNTER — Encounter (HOSPITAL_COMMUNITY): Payer: Self-pay | Admitting: Pediatrics

## 2019-10-17 ENCOUNTER — Other Ambulatory Visit: Payer: Self-pay

## 2019-10-17 ENCOUNTER — Emergency Department (HOSPITAL_COMMUNITY): Payer: Medicare Other

## 2019-10-17 DIAGNOSIS — M797 Fibromyalgia: Secondary | ICD-10-CM | POA: Diagnosis present

## 2019-10-17 DIAGNOSIS — Z79899 Other long term (current) drug therapy: Secondary | ICD-10-CM

## 2019-10-17 DIAGNOSIS — G9341 Metabolic encephalopathy: Secondary | ICD-10-CM | POA: Diagnosis not present

## 2019-10-17 DIAGNOSIS — Z885 Allergy status to narcotic agent status: Secondary | ICD-10-CM

## 2019-10-17 DIAGNOSIS — Z91040 Latex allergy status: Secondary | ICD-10-CM

## 2019-10-17 DIAGNOSIS — J80 Acute respiratory distress syndrome: Secondary | ICD-10-CM | POA: Diagnosis present

## 2019-10-17 DIAGNOSIS — Z833 Family history of diabetes mellitus: Secondary | ICD-10-CM

## 2019-10-17 DIAGNOSIS — T4275XA Adverse effect of unspecified antiepileptic and sedative-hypnotic drugs, initial encounter: Secondary | ICD-10-CM | POA: Diagnosis not present

## 2019-10-17 DIAGNOSIS — M2548 Effusion, other site: Secondary | ICD-10-CM | POA: Diagnosis not present

## 2019-10-17 DIAGNOSIS — F40240 Claustrophobia: Secondary | ICD-10-CM | POA: Diagnosis present

## 2019-10-17 DIAGNOSIS — Z823 Family history of stroke: Secondary | ICD-10-CM

## 2019-10-17 DIAGNOSIS — M48061 Spinal stenosis, lumbar region without neurogenic claudication: Secondary | ICD-10-CM | POA: Diagnosis present

## 2019-10-17 DIAGNOSIS — I471 Supraventricular tachycardia: Secondary | ICD-10-CM | POA: Diagnosis not present

## 2019-10-17 DIAGNOSIS — E669 Obesity, unspecified: Secondary | ICD-10-CM | POA: Diagnosis present

## 2019-10-17 DIAGNOSIS — I2602 Saddle embolus of pulmonary artery with acute cor pulmonale: Secondary | ICD-10-CM | POA: Diagnosis not present

## 2019-10-17 DIAGNOSIS — E039 Hypothyroidism, unspecified: Secondary | ICD-10-CM | POA: Diagnosis present

## 2019-10-17 DIAGNOSIS — I6782 Cerebral ischemia: Secondary | ICD-10-CM | POA: Diagnosis not present

## 2019-10-17 DIAGNOSIS — G2581 Restless legs syndrome: Secondary | ICD-10-CM | POA: Diagnosis present

## 2019-10-17 DIAGNOSIS — Z96643 Presence of artificial hip joint, bilateral: Secondary | ICD-10-CM | POA: Diagnosis present

## 2019-10-17 DIAGNOSIS — K219 Gastro-esophageal reflux disease without esophagitis: Secondary | ICD-10-CM | POA: Diagnosis present

## 2019-10-17 DIAGNOSIS — Y92239 Unspecified place in hospital as the place of occurrence of the external cause: Secondary | ICD-10-CM | POA: Diagnosis not present

## 2019-10-17 DIAGNOSIS — Z888 Allergy status to other drugs, medicaments and biological substances status: Secondary | ICD-10-CM

## 2019-10-17 DIAGNOSIS — Z961 Presence of intraocular lens: Secondary | ICD-10-CM | POA: Diagnosis present

## 2019-10-17 DIAGNOSIS — J9691 Respiratory failure, unspecified with hypoxia: Secondary | ICD-10-CM

## 2019-10-17 DIAGNOSIS — J156 Pneumonia due to other aerobic Gram-negative bacteria: Secondary | ICD-10-CM | POA: Diagnosis not present

## 2019-10-17 DIAGNOSIS — K589 Irritable bowel syndrome without diarrhea: Secondary | ICD-10-CM | POA: Diagnosis present

## 2019-10-17 DIAGNOSIS — U071 COVID-19: Secondary | ICD-10-CM | POA: Diagnosis not present

## 2019-10-17 DIAGNOSIS — F329 Major depressive disorder, single episode, unspecified: Secondary | ICD-10-CM | POA: Diagnosis present

## 2019-10-17 DIAGNOSIS — J9690 Respiratory failure, unspecified, unspecified whether with hypoxia or hypercapnia: Secondary | ICD-10-CM

## 2019-10-17 DIAGNOSIS — Z66 Do not resuscitate: Secondary | ICD-10-CM | POA: Diagnosis not present

## 2019-10-17 DIAGNOSIS — N179 Acute kidney failure, unspecified: Secondary | ICD-10-CM | POA: Diagnosis present

## 2019-10-17 DIAGNOSIS — Z01818 Encounter for other preprocedural examination: Secondary | ICD-10-CM

## 2019-10-17 DIAGNOSIS — Z96653 Presence of artificial knee joint, bilateral: Secondary | ICD-10-CM | POA: Diagnosis present

## 2019-10-17 DIAGNOSIS — Z7982 Long term (current) use of aspirin: Secondary | ICD-10-CM

## 2019-10-17 DIAGNOSIS — Z9841 Cataract extraction status, right eye: Secondary | ICD-10-CM

## 2019-10-17 DIAGNOSIS — I4891 Unspecified atrial fibrillation: Secondary | ICD-10-CM | POA: Diagnosis not present

## 2019-10-17 DIAGNOSIS — R0902 Hypoxemia: Secondary | ICD-10-CM | POA: Diagnosis not present

## 2019-10-17 DIAGNOSIS — E875 Hyperkalemia: Secondary | ICD-10-CM | POA: Diagnosis not present

## 2019-10-17 DIAGNOSIS — A419 Sepsis, unspecified organism: Secondary | ICD-10-CM | POA: Diagnosis not present

## 2019-10-17 DIAGNOSIS — Y95 Nosocomial condition: Secondary | ICD-10-CM | POA: Diagnosis not present

## 2019-10-17 DIAGNOSIS — J150 Pneumonia due to Klebsiella pneumoniae: Secondary | ICD-10-CM | POA: Diagnosis not present

## 2019-10-17 DIAGNOSIS — Z818 Family history of other mental and behavioral disorders: Secondary | ICD-10-CM

## 2019-10-17 DIAGNOSIS — M3500 Sicca syndrome, unspecified: Secondary | ICD-10-CM | POA: Diagnosis present

## 2019-10-17 DIAGNOSIS — R6889 Other general symptoms and signs: Secondary | ICD-10-CM | POA: Diagnosis not present

## 2019-10-17 DIAGNOSIS — R0602 Shortness of breath: Secondary | ICD-10-CM

## 2019-10-17 DIAGNOSIS — J9601 Acute respiratory failure with hypoxia: Secondary | ICD-10-CM

## 2019-10-17 DIAGNOSIS — J45909 Unspecified asthma, uncomplicated: Secondary | ICD-10-CM | POA: Diagnosis present

## 2019-10-17 DIAGNOSIS — Z981 Arthrodesis status: Secondary | ICD-10-CM | POA: Diagnosis not present

## 2019-10-17 DIAGNOSIS — R4182 Altered mental status, unspecified: Secondary | ICD-10-CM | POA: Diagnosis not present

## 2019-10-17 DIAGNOSIS — Z4659 Encounter for fitting and adjustment of other gastrointestinal appliance and device: Secondary | ICD-10-CM

## 2019-10-17 DIAGNOSIS — Z6841 Body Mass Index (BMI) 40.0 and over, adult: Secondary | ICD-10-CM

## 2019-10-17 DIAGNOSIS — I2510 Atherosclerotic heart disease of native coronary artery without angina pectoris: Secondary | ICD-10-CM | POA: Diagnosis present

## 2019-10-17 DIAGNOSIS — Z820 Family history of epilepsy and other diseases of the nervous system: Secondary | ICD-10-CM

## 2019-10-17 DIAGNOSIS — D509 Iron deficiency anemia, unspecified: Secondary | ICD-10-CM | POA: Diagnosis present

## 2019-10-17 DIAGNOSIS — J15212 Pneumonia due to Methicillin resistant Staphylococcus aureus: Secondary | ICD-10-CM | POA: Diagnosis not present

## 2019-10-17 DIAGNOSIS — I7090 Unspecified atherosclerosis: Secondary | ICD-10-CM | POA: Diagnosis not present

## 2019-10-17 DIAGNOSIS — J398 Other specified diseases of upper respiratory tract: Secondary | ICD-10-CM | POA: Diagnosis present

## 2019-10-17 DIAGNOSIS — R918 Other nonspecific abnormal finding of lung field: Secondary | ICD-10-CM | POA: Diagnosis not present

## 2019-10-17 DIAGNOSIS — M1990 Unspecified osteoarthritis, unspecified site: Secondary | ICD-10-CM | POA: Diagnosis present

## 2019-10-17 DIAGNOSIS — G4700 Insomnia, unspecified: Secondary | ICD-10-CM | POA: Diagnosis present

## 2019-10-17 DIAGNOSIS — A4102 Sepsis due to Methicillin resistant Staphylococcus aureus: Secondary | ICD-10-CM | POA: Diagnosis not present

## 2019-10-17 DIAGNOSIS — J1282 Pneumonia due to coronavirus disease 2019: Secondary | ICD-10-CM | POA: Diagnosis present

## 2019-10-17 DIAGNOSIS — R0689 Other abnormalities of breathing: Secondary | ICD-10-CM | POA: Diagnosis not present

## 2019-10-17 DIAGNOSIS — J9620 Acute and chronic respiratory failure, unspecified whether with hypoxia or hypercapnia: Secondary | ICD-10-CM | POA: Diagnosis not present

## 2019-10-17 DIAGNOSIS — R509 Fever, unspecified: Secondary | ICD-10-CM

## 2019-10-17 DIAGNOSIS — F419 Anxiety disorder, unspecified: Secondary | ICD-10-CM | POA: Diagnosis present

## 2019-10-17 DIAGNOSIS — Z4682 Encounter for fitting and adjustment of non-vascular catheter: Secondary | ICD-10-CM | POA: Diagnosis not present

## 2019-10-17 DIAGNOSIS — R6521 Severe sepsis with septic shock: Secondary | ICD-10-CM | POA: Diagnosis not present

## 2019-10-17 DIAGNOSIS — E1165 Type 2 diabetes mellitus with hyperglycemia: Secondary | ICD-10-CM | POA: Diagnosis present

## 2019-10-17 DIAGNOSIS — I468 Cardiac arrest due to other underlying condition: Secondary | ICD-10-CM | POA: Diagnosis not present

## 2019-10-17 DIAGNOSIS — Z515 Encounter for palliative care: Secondary | ICD-10-CM | POA: Diagnosis not present

## 2019-10-17 DIAGNOSIS — Z9911 Dependence on respirator [ventilator] status: Secondary | ICD-10-CM

## 2019-10-17 DIAGNOSIS — Z79890 Hormone replacement therapy (postmenopausal): Secondary | ICD-10-CM

## 2019-10-17 DIAGNOSIS — Z452 Encounter for adjustment and management of vascular access device: Secondary | ICD-10-CM | POA: Diagnosis not present

## 2019-10-17 DIAGNOSIS — J9602 Acute respiratory failure with hypercapnia: Secondary | ICD-10-CM

## 2019-10-17 DIAGNOSIS — Z9842 Cataract extraction status, left eye: Secondary | ICD-10-CM

## 2019-10-17 DIAGNOSIS — R040 Epistaxis: Secondary | ICD-10-CM | POA: Diagnosis not present

## 2019-10-17 DIAGNOSIS — Z808 Family history of malignant neoplasm of other organs or systems: Secondary | ICD-10-CM

## 2019-10-17 DIAGNOSIS — Z90710 Acquired absence of both cervix and uterus: Secondary | ICD-10-CM

## 2019-10-17 DIAGNOSIS — J189 Pneumonia, unspecified organism: Secondary | ICD-10-CM | POA: Diagnosis not present

## 2019-10-17 DIAGNOSIS — R7989 Other specified abnormal findings of blood chemistry: Secondary | ICD-10-CM | POA: Diagnosis not present

## 2019-10-17 DIAGNOSIS — R069 Unspecified abnormalities of breathing: Secondary | ICD-10-CM

## 2019-10-17 DIAGNOSIS — E876 Hypokalemia: Secondary | ICD-10-CM | POA: Diagnosis not present

## 2019-10-17 DIAGNOSIS — J939 Pneumothorax, unspecified: Secondary | ICD-10-CM

## 2019-10-17 DIAGNOSIS — I639 Cerebral infarction, unspecified: Secondary | ICD-10-CM | POA: Diagnosis not present

## 2019-10-17 DIAGNOSIS — M810 Age-related osteoporosis without current pathological fracture: Secondary | ICD-10-CM | POA: Diagnosis present

## 2019-10-17 DIAGNOSIS — Z8249 Family history of ischemic heart disease and other diseases of the circulatory system: Secondary | ICD-10-CM

## 2019-10-17 DIAGNOSIS — J181 Lobar pneumonia, unspecified organism: Secondary | ICD-10-CM | POA: Diagnosis not present

## 2019-10-17 DIAGNOSIS — H748X3 Other specified disorders of middle ear and mastoid, bilateral: Secondary | ICD-10-CM | POA: Diagnosis not present

## 2019-10-17 DIAGNOSIS — F411 Generalized anxiety disorder: Secondary | ICD-10-CM | POA: Diagnosis present

## 2019-10-17 DIAGNOSIS — I10 Essential (primary) hypertension: Secondary | ICD-10-CM | POA: Diagnosis present

## 2019-10-17 DIAGNOSIS — Z978 Presence of other specified devices: Secondary | ICD-10-CM

## 2019-10-17 DIAGNOSIS — R001 Bradycardia, unspecified: Secondary | ICD-10-CM | POA: Diagnosis not present

## 2019-10-17 DIAGNOSIS — I700 Atherosclerosis of aorta: Secondary | ICD-10-CM | POA: Diagnosis not present

## 2019-10-17 DIAGNOSIS — E785 Hyperlipidemia, unspecified: Secondary | ICD-10-CM | POA: Diagnosis present

## 2019-10-17 LAB — D-DIMER, QUANTITATIVE: D-Dimer, Quant: 1.57 ug/mL-FEU — ABNORMAL HIGH (ref 0.00–0.50)

## 2019-10-17 LAB — URINALYSIS, ROUTINE W REFLEX MICROSCOPIC
Bacteria, UA: NONE SEEN
Bilirubin Urine: NEGATIVE
Glucose, UA: NEGATIVE mg/dL
Hgb urine dipstick: NEGATIVE
Ketones, ur: NEGATIVE mg/dL
Leukocytes,Ua: NEGATIVE
Nitrite: NEGATIVE
Protein, ur: 30 mg/dL — AB
Specific Gravity, Urine: 1.018 (ref 1.005–1.030)
pH: 5 (ref 5.0–8.0)

## 2019-10-17 LAB — CBC WITH DIFFERENTIAL/PLATELET
Abs Immature Granulocytes: 0.18 10*3/uL — ABNORMAL HIGH (ref 0.00–0.07)
Basophils Absolute: 0 10*3/uL (ref 0.0–0.1)
Basophils Relative: 0 %
Eosinophils Absolute: 0 10*3/uL (ref 0.0–0.5)
Eosinophils Relative: 0 %
HCT: 36.5 % (ref 36.0–46.0)
Hemoglobin: 12.8 g/dL (ref 12.0–15.0)
Immature Granulocytes: 2 %
Lymphocytes Relative: 12 %
Lymphs Abs: 1.1 10*3/uL (ref 0.7–4.0)
MCH: 32.2 pg (ref 26.0–34.0)
MCHC: 35.1 g/dL (ref 30.0–36.0)
MCV: 91.9 fL (ref 80.0–100.0)
Monocytes Absolute: 0.7 10*3/uL (ref 0.1–1.0)
Monocytes Relative: 7 %
Neutro Abs: 7.5 10*3/uL (ref 1.7–7.7)
Neutrophils Relative %: 79 %
Platelets: 217 10*3/uL (ref 150–400)
RBC: 3.97 MIL/uL (ref 3.87–5.11)
RDW: 13.4 % (ref 11.5–15.5)
WBC: 9.3 10*3/uL (ref 4.0–10.5)
nRBC: 0 % (ref 0.0–0.2)

## 2019-10-17 LAB — LACTIC ACID, PLASMA
Lactic Acid, Venous: 1.5 mmol/L (ref 0.5–1.9)
Lactic Acid, Venous: 0.9 mmol/L (ref 0.5–1.9)

## 2019-10-17 LAB — TROPONIN I (HIGH SENSITIVITY): Troponin I (High Sensitivity): 29 ng/L — ABNORMAL HIGH (ref <18)

## 2019-10-17 LAB — I-STAT ARTERIAL BLOOD GAS, ED
Acid-base deficit: 3 mmol/L — ABNORMAL HIGH (ref 0.0–2.0)
Bicarbonate: 21 mmol/L (ref 20.0–28.0)
Calcium, Ion: 1.03 mmol/L — ABNORMAL LOW (ref 1.15–1.40)
HCT: 31 % — ABNORMAL LOW (ref 36.0–46.0)
Hemoglobin: 10.5 g/dL — ABNORMAL LOW (ref 12.0–15.0)
O2 Saturation: 99 %
Patient temperature: 101
Potassium: 3.3 mmol/L — ABNORMAL LOW (ref 3.5–5.1)
Sodium: 130 mmol/L — ABNORMAL LOW (ref 135–145)
TCO2: 22 mmol/L (ref 22–32)
pCO2 arterial: 34.8 mmHg (ref 32.0–48.0)
pH, Arterial: 7.396 (ref 7.350–7.450)
pO2, Arterial: 133 mmHg — ABNORMAL HIGH (ref 83.0–108.0)

## 2019-10-17 LAB — FIBRINOGEN: Fibrinogen: 785 mg/dL — ABNORMAL HIGH (ref 210–475)

## 2019-10-17 LAB — PROTIME-INR
INR: 1.2 (ref 0.8–1.2)
Prothrombin Time: 14.3 seconds (ref 11.4–15.2)

## 2019-10-17 LAB — PROCALCITONIN: Procalcitonin: <0.1 ng/mL

## 2019-10-17 LAB — TRIGLYCERIDES: Triglycerides: 178 mg/dL — ABNORMAL HIGH (ref <150)

## 2019-10-17 LAB — C-REACTIVE PROTEIN: CRP: 12.4 mg/dL — ABNORMAL HIGH (ref <1.0)

## 2019-10-17 LAB — LACTATE DEHYDROGENASE: LDH: 424 U/L — ABNORMAL HIGH (ref 98–192)

## 2019-10-17 LAB — FERRITIN: Ferritin: 485 ng/mL — ABNORMAL HIGH (ref 11–307)

## 2019-10-17 LAB — SARS CORONAVIRUS 2 BY RT PCR (HOSPITAL ORDER, PERFORMED IN ~~LOC~~ HOSPITAL LAB): SARS Coronavirus 2: POSITIVE — AB

## 2019-10-17 LAB — HIV ANTIBODY (ROUTINE TESTING W REFLEX): HIV Screen 4th Generation wRfx: NONREACTIVE

## 2019-10-17 MED ORDER — FENTANYL 2500MCG IN NS 250ML (10MCG/ML) PREMIX INFUSION
25.0000 ug/h | INTRAVENOUS | Status: DC
  Administered 2019-10-17: 100 ug/h via INTRAVENOUS
  Filled 2019-10-17: qty 250

## 2019-10-17 MED ORDER — FENTANYL BOLUS VIA INFUSION
25.0000 ug | INTRAVENOUS | Status: DC | PRN
  Filled 2019-10-17: qty 25

## 2019-10-17 MED ORDER — FENTANYL CITRATE (PF) 100 MCG/2ML IJ SOLN
INTRAMUSCULAR | Status: AC | PRN
  Administered 2019-10-17 (×2): 100 ug via INTRAVENOUS

## 2019-10-17 MED ORDER — ATORVASTATIN CALCIUM 40 MG PO TABS
40.0000 mg | ORAL_TABLET | Freq: Every day | ORAL | Status: DC
  Administered 2019-10-18 – 2019-10-21 (×4): 40 mg via ORAL
  Filled 2019-10-17 (×4): qty 1

## 2019-10-17 MED ORDER — PANTOPRAZOLE SODIUM 40 MG IV SOLR
40.0000 mg | Freq: Every day | INTRAVENOUS | Status: DC
  Administered 2019-10-17: 40 mg via INTRAVENOUS
  Filled 2019-10-17: qty 40

## 2019-10-17 MED ORDER — DOCUSATE SODIUM 50 MG/5ML PO LIQD
100.0000 mg | Freq: Two times a day (BID) | ORAL | Status: DC
  Administered 2019-10-18: 100 mg
  Filled 2019-10-17 (×2): qty 10

## 2019-10-17 MED ORDER — ETOMIDATE 2 MG/ML IV SOLN: 0.3000 mg/kg | Freq: Once | INTRAVENOUS | Status: DC

## 2019-10-17 MED ORDER — MIDAZOLAM HCL 2 MG/2ML IJ SOLN: 1.0000 mg | INTRAMUSCULAR | Status: DC | PRN

## 2019-10-17 MED ORDER — SODIUM CHLORIDE 0.9% FLUSH
3.0000 mL | Freq: Two times a day (BID) | INTRAVENOUS | Status: DC
  Administered 2019-10-18 – 2019-10-19 (×3): 3 mL via INTRAVENOUS

## 2019-10-17 MED ORDER — DIVALPROEX SODIUM 250 MG PO DR TAB
250.0000 mg | DELAYED_RELEASE_TABLET | Freq: Every day | ORAL | Status: DC
  Administered 2019-10-18 – 2019-10-21 (×4): 250 mg via ORAL
  Filled 2019-10-17 (×4): qty 1

## 2019-10-17 MED ORDER — SODIUM CHLORIDE 0.9 % IV SOLN
200.0000 mg | Freq: Once | INTRAVENOUS | Status: AC
  Administered 2019-10-17: 200 mg via INTRAVENOUS
  Filled 2019-10-17: qty 40

## 2019-10-17 MED ORDER — ACETAMINOPHEN 650 MG RE SUPP
650.0000 mg | Freq: Once | RECTAL | Status: AC
  Administered 2019-10-17: 650 mg via RECTAL
  Filled 2019-10-17: qty 1

## 2019-10-17 MED ORDER — FENTANYL CITRATE (PF) 100 MCG/2ML IJ SOLN: 50.0000 ug | Freq: Once | INTRAMUSCULAR | Status: DC

## 2019-10-17 MED ORDER — MIDAZOLAM HCL 2 MG/2ML IJ SOLN
1.0000 mg | INTRAMUSCULAR | Status: DC | PRN
  Filled 2019-10-17 (×2): qty 2

## 2019-10-17 MED ORDER — IPRATROPIUM-ALBUTEROL 0.5-2.5 (3) MG/3ML IN SOLN: 3.0000 mL | RESPIRATORY_TRACT | Status: DC | PRN

## 2019-10-17 MED ORDER — ENOXAPARIN SODIUM 60 MG/0.6ML ~~LOC~~ SOLN
45.0000 mg | SUBCUTANEOUS | Status: DC
  Administered 2019-10-17 – 2019-10-22 (×6): 45 mg via SUBCUTANEOUS
  Filled 2019-10-17: qty 0.45
  Filled 2019-10-17 (×5): qty 0.6

## 2019-10-17 MED ORDER — CHLORHEXIDINE GLUCONATE 0.12% ORAL RINSE (MEDLINE KIT)
15.0000 mL | Freq: Two times a day (BID) | OROMUCOSAL | Status: DC
  Administered 2019-10-18 (×3): 15 mL via OROMUCOSAL

## 2019-10-17 MED ORDER — ORAL CARE MOUTH RINSE
15.0000 mL | OROMUCOSAL | Status: DC
  Administered 2019-10-18 – 2019-10-19 (×14): 15 mL via OROMUCOSAL

## 2019-10-17 MED ORDER — FENTANYL CITRATE (PF) 100 MCG/2ML IJ SOLN
50.0000 ug | INTRAMUSCULAR | Status: DC | PRN
  Filled 2019-10-17 (×2): qty 2

## 2019-10-17 MED ORDER — SODIUM CHLORIDE 0.9 % IV SOLN
250.0000 mL | INTRAVENOUS | Status: DC
  Administered 2019-10-17 – 2019-11-01 (×4): 250 mL via INTRAVENOUS
  Administered 2019-11-04: 1000 mL via INTRAVENOUS

## 2019-10-17 MED ORDER — PROPOFOL 1000 MG/100ML IV EMUL
5.0000 ug/kg/min | INTRAVENOUS | Status: DC
  Administered 2019-10-18: 30 ug/kg/min via INTRAVENOUS
  Administered 2019-10-18: 20 ug/kg/min via INTRAVENOUS
  Administered 2019-10-19: 50 ug/kg/min via INTRAVENOUS
  Administered 2019-10-19: 40 ug/kg/min via INTRAVENOUS
  Administered 2019-10-19: 50 ug/kg/min via INTRAVENOUS
  Administered 2019-10-19: 20 ug/kg/min via INTRAVENOUS
  Administered 2019-10-19: 40 ug/kg/min via INTRAVENOUS
  Administered 2019-10-19: 50 ug/kg/min via INTRAVENOUS
  Administered 2019-10-20: 70 ug/kg/min via INTRAVENOUS
  Administered 2019-10-20: 50 ug/kg/min via INTRAVENOUS
  Administered 2019-10-20: 60 ug/kg/min via INTRAVENOUS
  Administered 2019-10-20: 70 ug/kg/min via INTRAVENOUS
  Administered 2019-10-20 (×3): 50 ug/kg/min via INTRAVENOUS
  Administered 2019-10-20: 70 ug/kg/min via INTRAVENOUS
  Administered 2019-10-21: 50 ug/kg/min via INTRAVENOUS
  Administered 2019-10-21: 60 ug/kg/min via INTRAVENOUS
  Administered 2019-10-21: 70 ug/kg/min via INTRAVENOUS
  Administered 2019-10-21: 60 ug/kg/min via INTRAVENOUS
  Administered 2019-10-21: 50 ug/kg/min via INTRAVENOUS
  Administered 2019-10-21: 20 ug/kg/min via INTRAVENOUS
  Administered 2019-10-22 (×2): 60 ug/kg/min via INTRAVENOUS
  Administered 2019-10-22: 30 ug/kg/min via INTRAVENOUS
  Administered 2019-10-22 (×3): 60 ug/kg/min via INTRAVENOUS
  Administered 2019-10-23: 50 ug/kg/min via INTRAVENOUS
  Administered 2019-10-23: 30 ug/kg/min via INTRAVENOUS
  Administered 2019-10-23: 60 ug/kg/min via INTRAVENOUS
  Administered 2019-10-23: 40 ug/kg/min via INTRAVENOUS
  Administered 2019-10-23: 60 ug/kg/min via INTRAVENOUS
  Administered 2019-10-23: 45 ug/kg/min via INTRAVENOUS
  Administered 2019-10-24 (×3): 50 ug/kg/min via INTRAVENOUS
  Administered 2019-10-24: 40 ug/kg/min via INTRAVENOUS
  Administered 2019-10-24 – 2019-10-27 (×19): 50 ug/kg/min via INTRAVENOUS
  Administered 2019-10-28 (×2): 60 ug/kg/min via INTRAVENOUS
  Administered 2019-10-28: 50 ug/kg/min via INTRAVENOUS
  Administered 2019-10-28: 60 ug/kg/min via INTRAVENOUS
  Administered 2019-10-28: 50 ug/kg/min via INTRAVENOUS
  Administered 2019-10-28: 60 ug/kg/min via INTRAVENOUS
  Administered 2019-10-29: 30 ug/kg/min via INTRAVENOUS
  Administered 2019-10-29: 20 ug/kg/min via INTRAVENOUS
  Filled 2019-10-17 (×4): qty 100
  Filled 2019-10-17: qty 200
  Filled 2019-10-17 (×6): qty 100
  Filled 2019-10-17: qty 200
  Filled 2019-10-17 (×9): qty 100
  Filled 2019-10-17: qty 200
  Filled 2019-10-17 (×5): qty 100
  Filled 2019-10-17: qty 200
  Filled 2019-10-17 (×3): qty 100
  Filled 2019-10-17: qty 200
  Filled 2019-10-17 (×17): qty 100
  Filled 2019-10-17: qty 200
  Filled 2019-10-17 (×4): qty 100
  Filled 2019-10-17: qty 200
  Filled 2019-10-17 (×12): qty 100

## 2019-10-17 MED ORDER — SODIUM CHLORIDE 0.9 % IV SOLN: 250.0000 mL | INTRAVENOUS | Status: DC | PRN

## 2019-10-17 MED ORDER — NOREPINEPHRINE 4 MG/250ML-% IV SOLN
INTRAVENOUS | Status: AC
  Administered 2019-10-17: 2 ug/min via INTRAVENOUS
  Filled 2019-10-17: qty 250

## 2019-10-17 MED ORDER — METHYLPREDNISOLONE SODIUM SUCC 125 MG IJ SOLR
INTRAMUSCULAR | Status: AC
  Administered 2019-10-17: 45.625 mg via INTRAVENOUS
  Filled 2019-10-17: qty 2

## 2019-10-17 MED ORDER — SUCCINYLCHOLINE CHLORIDE 20 MG/ML IJ SOLN
INTRAMUSCULAR | Status: AC | PRN
  Administered 2019-10-17: 100 mg via INTRAVENOUS

## 2019-10-17 MED ORDER — SODIUM CHLORIDE 0.9 % IV BOLUS
1000.0000 mL | Freq: Once | INTRAVENOUS | Status: AC
  Administered 2019-10-17: 1000 mL via INTRAVENOUS

## 2019-10-17 MED ORDER — METHYLPREDNISOLONE SODIUM SUCC 125 MG IJ SOLR
0.5000 mg/kg | Freq: Two times a day (BID) | INTRAMUSCULAR | Status: DC
  Administered 2019-10-18 – 2019-10-23 (×11): 45.625 mg via INTRAVENOUS
  Filled 2019-10-17 (×12): qty 2

## 2019-10-17 MED ORDER — LEVOTHYROXINE SODIUM 50 MCG PO TABS
50.0000 ug | ORAL_TABLET | Freq: Every day | ORAL | Status: DC
  Administered 2019-10-18 – 2019-10-21 (×4): 50 ug via ORAL
  Filled 2019-10-17 (×4): qty 1

## 2019-10-17 MED ORDER — VENLAFAXINE HCL ER 37.5 MG PO CP24
37.5000 mg | ORAL_CAPSULE | Freq: Every day | ORAL | Status: DC
  Administered 2019-10-18: 37.5 mg via ORAL
  Filled 2019-10-17: qty 1

## 2019-10-17 MED ORDER — MIDAZOLAM 50MG/50ML (1MG/ML) PREMIX INFUSION
0.0000 mg/h | INTRAVENOUS | Status: DC
  Administered 2019-10-17: 2 mg/h via INTRAVENOUS
  Administered 2019-10-18: 6 mg/h via INTRAVENOUS
  Filled 2019-10-17 (×2): qty 50

## 2019-10-17 MED ORDER — SUCCINYLCHOLINE CHLORIDE 20 MG/ML IJ SOLN
100.0000 mg | Freq: Once | INTRAMUSCULAR | Status: DC
  Filled 2019-10-17: qty 5

## 2019-10-17 MED ORDER — MIDAZOLAM HCL 5 MG/5ML IJ SOLN
INTRAMUSCULAR | Status: AC | PRN
  Administered 2019-10-17 (×2): 2 mg via INTRAVENOUS

## 2019-10-17 MED ORDER — ETOMIDATE 2 MG/ML IV SOLN
INTRAVENOUS | Status: AC | PRN
  Administered 2019-10-17: 27 mg via INTRAVENOUS

## 2019-10-17 MED ORDER — NOREPINEPHRINE 4 MG/250ML-% IV SOLN
2.0000 ug/min | INTRAVENOUS | Status: DC
  Administered 2019-10-18: 6 ug/min via INTRAVENOUS
  Administered 2019-10-19: 4 ug/min via INTRAVENOUS
  Administered 2019-10-19: 5 ug/min via INTRAVENOUS
  Administered 2019-10-19 – 2019-10-20 (×2): 2 ug/min via INTRAVENOUS
  Administered 2019-10-21: 5 ug/min via INTRAVENOUS
  Administered 2019-10-22: 3 ug/min via INTRAVENOUS
  Filled 2019-10-17 (×5): qty 250

## 2019-10-17 MED ORDER — ACETAMINOPHEN 325 MG PO TABS
650.0000 mg | ORAL_TABLET | Freq: Four times a day (QID) | ORAL | Status: DC | PRN
  Administered 2019-10-22 – 2019-11-09 (×8): 650 mg via ORAL
  Filled 2019-10-17 (×10): qty 2

## 2019-10-17 MED ORDER — PROPOFOL 1000 MG/100ML IV EMUL
INTRAVENOUS | Status: AC
  Administered 2019-10-17: 5 ug/kg/min via INTRAVENOUS
  Filled 2019-10-17: qty 100

## 2019-10-17 MED ORDER — FENTANYL CITRATE (PF) 100 MCG/2ML IJ SOLN: 50.0000 ug | INTRAMUSCULAR | Status: DC | PRN

## 2019-10-17 MED ORDER — SODIUM CHLORIDE 0.9 % IV SOLN
100.0000 mg | Freq: Every day | INTRAVENOUS | Status: AC
  Administered 2019-10-18 – 2019-10-21 (×4): 100 mg via INTRAVENOUS
  Filled 2019-10-17 (×5): qty 20

## 2019-10-17 MED ORDER — SODIUM CHLORIDE 0.9% FLUSH: 3.0000 mL | INTRAVENOUS | Status: DC | PRN

## 2019-10-17 MED ORDER — MIDAZOLAM BOLUS VIA INFUSION
1.0000 mg | INTRAVENOUS | Status: DC | PRN
  Filled 2019-10-17: qty 2

## 2019-10-17 NOTE — H&P (Signed)
NAME:  Jacqueline Lucas, MRN:  [id number], DOB:  March 27, 1950, LOS: 0 ADMISSION DATE:  10/06/2019, CONSULTATION DATE:  10/06/2019 REFERRING MD:  EDP, CHIEF COMPLAINT:  Dyspnea   Brief History   69 y.o. F PCCM consulted for admission.with PMH significant for Asthma, Obesity, HTN, hypothyroidism who was diagnosed with Covid-19 on 8/19 and presented to the ED in respiratory distress and was intubated.    History of present illness   Jacqueline Lucas is a 69 y.o. F with PMH significant for Asthma, Obesity, HTN, HLD, hypothyroidism who was diagnosed with Covid-19 on 8/19 and presented to the ED in respiratory distress and was intubated. Her initial oxygen sats were 40% on RA initially.  She was tachypneic to the 40s with elevated work of breathing per the ED doc.  She was intubated with CXR showing ground glass opacities bilaterally.  She was started on Remdesevir and steroids and PCCM consulted for admission.   Past Medical History   has a past medical history of Allergy, Allergy to adhesive tape, Anemia, Anginal pain ([place]), Anxiety, Arthritis, Asthma, Bronchitis, Cataract, Chronic nausea, Claustrophobia, DDD (degenerative disc disease), lumbar, Depression, Dry eyes, Fibromyalgia, GERD (gastroesophageal reflux disease), H/O bladder infections, Heart murmur, Hematuria, Hyperlipidemia, Hypertension, Hypothyroidism, IBS (irritable bowel syndrome), IDA (iron deficiency anemia) (05/13/2019), Insomnia, Lumbar neuritis, Migraines, Obesity, Osteoporosis, Palpitation, RLS (restless legs syndrome), Shoulder fracture, right, Sicca ([place]), Tachycardia, Thyroid disease, and Wears dentures.   Significant Hospital Events   8/20 Admit to PCCM  Consults:    Procedures:  8/20 ETT  Significant Diagnostic Tests:  8/20 CXR>>Worsening bilateral ground-glass opacities and consolidations consistent with pneumonia  Micro Data:  8/20 Covid-19>>positive 8/20 UC>> 8/20 BCx2>>   Antimicrobials:   8/20-  Interim  history/subjective:  As above  Objective   Blood pressure (!) 105/39, pulse 86, temperature (!) 101.4 F (38.6 C), temperature source Oral, resp. rate (!) 21, SpO2 100 %.    Vent Mode: PRVC FiO2 (%):  [100 %] 100 % Set Rate:  [20 bmp] 20 bmp Vt Set:  [430 mL] 430 mL PEEP:  [10 [id number] cmH20] 12 cmH20 Plateau Pressure:  [25 [id number] cmH20] 25 cmH20  No intake or output data in the 24 hours ending 10/28/2019 1959 There were no vitals filed for this visit.   General:  Intubated, occasionally arousable and uncomfortable appearing.   HEENT: MM pink/moist Neuro: arousable, intubated and sedated, opens eyes to voice.  CV: no MGR  PULM:  Diminished BS Bilaterally.  GI: soft, bsx4 active  Extremities: warm/dry, no edema  Skin: no rashes or lesions   Resolved Hospital Problem list     Assessment & Plan:   Acute Hypoxic Respiratory Failure secondary to Covid-19 PNA -Continue Remdesevir and Steroids -Metabolic panel pending, if renal function normal start Baricitinib -Low procal, hold abx for now -Continue peripheral Levophed to maintain MAP >65, if pressor needs rise then will need CVC --Maintain full vent support with SAT/SBT as tolerated -titrate Vent setting to maintain SpO2 greater than or equal to 90%. -HOB elevated 30 degrees. -Plateau pressures less than 30 cm H20, driving pressures <78 -Follow chest x-ray, ABG prn.   -Bronchial hygiene and RT/bronchodilator protocol.   Hypothyroidism -Continue Levothyroxine, check TSH   Depression -continue Depakote and Effexor      Best practice:  Diet: NPO Pain/Anxiety/Delirium protocol (if indicated): Fentanyl, versed VAP protocol (if indicated): HOB 30 degrees, suction prn DVT prophylaxis: Lovenox GI prophylaxis: protonix Glucose control: SSI Mobility: bed rest Code Status: full code Family Communication:  Disposition: ICU  Labs   CBC: Recent Labs  Lab 10/14/19 1917  WBC 4.1  HGB 14.5  HCT 40.3  MCV 88.6    PLT 134*    Basic Metabolic Panel: Recent Labs  Lab 10/14/19 1917  NA 135  K 3.6  CL 102  CO2 24  GLUCOSE 145*  BUN 8  CREATININE 0.57  CALCIUM 8.4*   GFR: Estimated Creatinine Clearance: 72.4 mL/min (by C-G formula based on SCr of 0.57 mg/dL). Recent Labs  Lab 10/14/19 1917  WBC 4.1    Liver Function Tests: No results for input(s): AST, ALT, ALKPHOS, BILITOT, PROT, ALBUMIN in the last 168 hours. No results for input(s): LIPASE, AMYLASE in the last 168 hours. No results for input(s): AMMONIA in the last 168 hours.  ABG No results found for: PHART, PCO2ART, PO2ART, HCO3, TCO2, ACIDBASEDEF, O2SAT   Coagulation Profile: No results for input(s): INR, PROTIME in the last 168 hours.  Cardiac Enzymes: No results for input(s): CKTOTAL, CKMB, CKMBINDEX, TROPONINI in the last 168 hours.  HbA1C: Hgb A1c MFr Bld  Date/Time Value Ref Range Status  05/05/2019 12:02 PM 6.3 (H) <5.7 % of total Hgb Final    Comment:    For someone without known diabetes, a hemoglobin  A1c value between 5.7% and 6.4% is consistent with prediabetes and should be confirmed with a  follow-up test. . For someone with known diabetes, a value <7% indicates that their diabetes is well controlled. A1c targets should be individualized based on duration of diabetes, age, comorbid conditions, and other considerations. . This assay result is consistent with an increased risk of diabetes. . Currently, no consensus exists regarding use of hemoglobin A1c for diagnosis of diabetes for children. [name]   10/28/2018 12:00 AM 6.2 (H) <5.7 % of total Hgb Final    Comment:    For someone without known diabetes, a hemoglobin  A1c value between 5.7% and 6.4% is consistent with prediabetes and should be confirmed with a  follow-up test. . For someone with known diabetes, a value <7% indicates that their diabetes is well controlled. A1c targets should be individualized based on duration of diabetes, age,  comorbid conditions, and other considerations. . This assay result is consistent with an increased risk of diabetes. . Currently, no consensus exists regarding use of hemoglobin A1c for diagnosis of diabetes for children. .     CBG: No results for input(s): GLUCAP in the last 168 hours.  Review of Systems:   Unable to obtain secondary to intubation  Past Medical History  She,  has a past medical history of Allergy, Allergy to adhesive tape, Anemia, Anginal pain ([place]), Anxiety, Arthritis, Asthma, Bronchitis, Cataract, Chronic nausea, Claustrophobia, DDD (degenerative disc disease), lumbar, Depression, Dry eyes, Fibromyalgia, GERD (gastroesophageal reflux disease), H/O bladder infections, Heart murmur, Hematuria, Hyperlipidemia, Hypertension, Hypothyroidism, IBS (irritable bowel syndrome), IDA (iron deficiency anemia) (05/13/2019), Insomnia, Lumbar neuritis, Migraines, Obesity, Osteoporosis, Palpitation, RLS (restless legs syndrome), Shoulder fracture, right, Sicca ([place]), Tachycardia, Thyroid disease, and Wears dentures.    Asthma HTN  HLD GERD Hypothyroidism IBS Anxiety/depression Fibromyalgia RLS osteoarthritis  Surgical History    Past Surgical History:  Procedure Laterality Date  . ABDOMINAL HYSTERECTOMY     due to cancer of ovaries  . BACK SURGERY N/A 2014   x 5  . BACK SURGERY  12/2017  . CARDIAC CATHETERIZATION     no PCI; 12/18/12 ([facility], Dr. Marcello Moores): EP study with ablation. No coronary angiography done then.  [name] CATARACT EXTRACTION W/PHACO  Right 04/01/2019   Procedure: CATARACT EXTRACTION PHACO AND INTRAOCULAR LENS PLACEMENT (IOC) RIGHT  TORIC 6.19 00:40.1;  Surgeon: Birder Robson, MD;  Location: [facility];  Service: Ophthalmology;  Laterality: Right;  . CATARACT EXTRACTION W/PHACO Left 04/29/2019   Procedure: CATARACT EXTRACTION PHACO AND INTRAOCULAR LENS PLACEMENT (IOC)  LEFT TORIC LENS 4.52  00:29.1;  Surgeon: Birder Robson, MD;  Location: [facility];  Service: Ophthalmology;  Laterality: Left;  . COLONOSCOPY W/ POLYPECTOMY    . COLONOSCOPY WITH PROPOFOL N/A 02/04/2016   Procedure: COLONOSCOPY WITH PROPOFOL;  Surgeon: Robert Bellow, MD;  Location[facility] ENDOSCOPY;  Service: Endoscopy;  Laterality: N/A;  . ESOPHAGOGASTRODUODENOSCOPY (EGD) WITH PROPOFOL N/A 02/04/2016   Procedure: ESOPHAGOGASTRODUODENOSCOPY (EGD) WITH PROPOFOL;  Surgeon: Robert Bellow, MD;  Location: [facility] ENDOSCOPY;  Service: Endoscopy;  Laterality: N/A;  . FINGER ARTHROSCOPY WITH CARPOMETACARPEL ([place]) ARTHROPLASTY Left 05/01/2016   Dr. Erlinda Hong  . GIVENS CAPSULE STUDY N/A 05/02/2016   Procedure: GIVENS CAPSULE STUDY;  Surgeon: Manya Silvas, MD;  Location[facility]COPY;  Service: Endoscopy;  Laterality: N/A;  . HAND SURGERY    . HEMORRHOID SURGERY    . HIP ARTHROPLASTY    . JOINT REPLACEMENT Bilateral    knee  . KNEE ARTHROPLASTY    . REPLACEMENT TOTAL KNEE Left   . SHOULDER ARTHROSCOPY Left 09/09/2018   Procedure: LEFT SHOULDER ARTHROSCOPY, BICEPS TENOTOMY;  Surgeon: Meredith Pel, MD;  Location: [place];  Service: Orthopedics;  Laterality: Left;  . SPINE SURGERY     x 2  . tendonititis     right elbow, right wrist  . TOTAL HIP ARTHROPLASTY Left 2013  . TOTAL HIP ARTHROPLASTY Right 08/16/2015   Procedure: TOTAL HIP ARTHROPLASTY ANTERIOR APPROACH;  Surgeon: Meredith Pel, MD;  Location: [place];  Service: Orthopedics;  Laterality: Right;  . TOTAL KNEE ARTHROPLASTY Right 03/16/2015   Procedure: RIGHT TOTAL KNEE ARTHROPLASTY, PCL SACRIFICING;  Surgeon: Meredith Pel, MD;  Location: [place];  Service: Orthopedics;  Laterality: Right;  . TRIGGER FINGER RELEASE  08/18/2019     Social History   reports that she has never smoked. She has never used smokeless tobacco. She reports that she does not drink alcohol and does not use drugs.   Family History   Her family history includes Alzheimer's disease in her mother; Cancer in her  brother; Depression in her sister; Diabetes in her father and mother; Fibromyalgia in her daughter and sister; Heart disease in her brother and mother; Hypertension in her mother and sister; Stroke in her son; Throat cancer in her father. There is no history of Bladder Cancer, Kidney disease, or Kidney cancer.   Allergies Allergies  Allergen Reactions  . Morphine Nausea And Vomiting    Ok with nausea medication  . Lyrica [Pregabalin] Other (See Comments)    Joint swelling  . Metformin And Related     diarrhea  . Diazepam Nausea And Vomiting    Can take with anti-nausea medication  . Latex Rash    Bandaid glue  . Rash Away  [Petrolatum-Zinc Oxide] Rash and Swelling  . Salicylates Other (See Comments)    Other reaction(s): Headache  . Tape Rash    Please use paper tape     Home Medications  Prior to Admission medications   Medication Sig Start Date End Date Taking? Authorizing Provider  aspirin 81 MG chewable tablet Chew 81 mg by mouth daily.    [provider]  atorvastatin (LIPITOR)  40 MG tablet TAKE 1 TABLET BY MOUTH EVERYDAY AT BEDTIME 08/24/19   [name], MD  Cholecalciferol (D3-1000) 1000 units tablet Take 1,000 Units by mouth at bedtime.     [provider]  diclofenac sodium (VOLTAREN) 1 % GEL Apply 2 g topically 4 (four) times daily. 12/17/18   Aundra Dubin, PA-C  divalproex (DEPAKOTE) 250 MG DR tablet TAKE 1 TABLET TWICE A DAY Patient taking differently: Take 250 mg by mouth daily.  05/21/19   [name], MD  estradiol (ESTRACE) 0.1 MG/GM vaginal cream APPLY A PEA SIZED AMOUNT JUST INSIDE THE VAGINAL INTROITUS WITH A FINGER TIP EVERY MONDAY, WEDNESDAY, AND FRIDAY NIGHTS 06/02/19   Zara Council A, PA-C  ferrous sulfate 325 (65 FE) MG tablet Take 325 mg by mouth daily.    [provider]  gabapentin (NEURONTIN) 300 MG capsule TAKE 2 CAPSULES 3 TIMES A  DAY 09/08/19   Bo Merino, MD  hydrOXYzine (VISTARIL) 25 MG capsule TAKE 1  CAPSULE 3 TIMES A   DAY AS NEEDED FOR ANXIETY 07/21/19   Ancil Boozer, Drue Stager, MD  lansoprazole (PREVACID) 30 MG capsule Take 1 capsule (30 mg total) daily by mouth. Patient taking differently: Take 30 mg by mouth 2 (two) times daily before a meal.  01/11/17 09/17/19  Sowles, Drue Stager, MD  levalbuterol (XOPENEX) 1.25 MG/3ML nebulizer solution INHALE THE CONTENTS OF 1   VIAL VIA NEBULIZER EVERY 4 HOURS AS NEEDED FOR        WHEEZING 09/10/19   Sowles, Drue Stager, MD  levothyroxine (SYNTHROID, LEVOTHROID) 50 MCG tablet Take 50 mcg by mouth daily before breakfast.    [provider]  LINZESS 145 MCG CAPS capsule TAKE 1 CAPSULE DAILY BEFOREBREAKFAST 12/04/18   [name], MD  MAGNESIUM OXIDE PO Take 400 mg by mouth at bedtime. Reported on 07/28/2015    [provider]  methocarbamol (ROBAXIN) 500 MG tablet Take 1 tablet (500 mg total) by mouth every 8 (eight) hours as needed. 08/18/19   Magnant, Charles L, PA-C  mometasone-formoterol (DULERA) 200-5 MCG/ACT AERO Inhale 2 puffs into the lungs 2 (two) times daily. 08/22/19   [name], MD  montelukast (SINGULAIR) 10 MG tablet TAKE 1 TABLET AT BEDTIME 07/01/19   Sowles, Drue Stager, MD  nystatin (MYCOSTATIN) 100000 UNIT/ML suspension TAKE 5 MLS (500,000 UNITS TOTAL) BY MOUTH 4 (FOUR) TIMES DAILY. 08/24/19   [name], MD  olmesartan-hydrochlorothiazide (BENICAR HCT) 40-12.5 MG tablet Take 0.5 tablets by mouth daily. 05/05/19   [name], MD  omega-3 acid ethyl esters (LOVAZA) 1 g capsule TAKE 2 CAPSULES TWICE DAILY 07/06/19   Ancil Boozer, Drue Stager, MD  ondansetron (ZOFRAN) 4 MG tablet Take 1 tablet (4 mg total) by mouth every 8 (eight) hours as needed for nausea or vomiting. 01/27/19   [name], MD  oxyCODONE (OXY IR/ROXICODONE) 5 MG immediate release tablet Take 5 mg by mouth every 8 (eight) hours as needed. 09/09/18   [provider]  RESTASIS 0.05 % ophthalmic emulsion Place 1 drop into both eyes 2 (two) times daily.  06/03/18    [provider]  traMADol (ULTRAM) 50 MG tablet Take 1 tablet (50 mg total) by mouth every 6 (six) hours as needed. 08/18/19 08/17/20  Magnant, Gerrianne Scale, PA-C  valACYclovir (VALTREX) 1000 MG tablet Take 1 tablet (1,000 mg total) by mouth 2 (two) times daily. 01/20/19   [name], MD  venlafaxine XR (EFFEXOR XR) 37.5 MG 24 hr capsule Take 1 capsule (37.5 mg total) by mouth daily with  breakfast. 08/22/19   [name], MD  vitamin B-12 (CYANOCOBALAMIN) 1000 MCG tablet Take 1,000 mcg by mouth at bedtime.    [provider]  albuterol (PROAIR HFA) 108 (90 BASE) MCG/ACT inhaler Inhale 2 puffs into the lungs every 6 (six) hours as needed for wheezing or shortness of breath. 12/15/14 09/03/18  Ashok Norris, MD     Critical care time: 60 minutes

## 2019-10-17 NOTE — ED Provider Notes (Signed)
[facility] EMERGENCY DEPARTMENT Provider Note   CSN: [id number] Arrival date & time: 10/23/2019  1805  LEVEL 5 CAVEAT - RESPIRATORY DISTRESS History Chief Complaint  Patient presents with  . Shortness of Breath    Jacqueline Lucas is a 69 y.o. female.  HPI 69 year old female presents in respiratory distress. History is limited by her work of breathing.  Otherwise, she notes she has been short of breath recently.  She states she was recently diagnosed with Covid.  Did not get her Covid immunizations.  Shortness of breath is worse.  She has O2 sats in the 40s on room air and was brought straight back to a treatment room.   Past Medical History:  Diagnosis Date  . Allergy   . Allergy to adhesive tape    Allergy to paper tape as well  . Anemia   . Anginal pain ([place])   . Anxiety   . Arthritis   . Asthma   . Bronchitis   . Cataract   . Chronic nausea   . Claustrophobia   . DDD (degenerative disc disease), lumbar   . Depression   . Dry eyes    uses Restasis  . Fibromyalgia   . GERD (gastroesophageal reflux disease)   . H/O bladder infections   . Heart murmur   . Hematuria   . Hyperlipidemia   . Hypertension   . Hypothyroidism   . IBS (irritable bowel syndrome)   . IDA (iron deficiency anemia) 05/13/2019  . Insomnia   . Lumbar neuritis   . Migraines   . Obesity   . Osteoporosis   . Palpitation   . RLS (restless legs syndrome)   . Shoulder fracture, right   . Sicca ([place])   . Tachycardia   . Thyroid disease   . Wears dentures    partial lower    Patient Active Problem List   Diagnosis Date Noted  . COVID-19 10/28/2019  . IDA (iron deficiency anemia) 05/13/2019  . Superior glenoid labrum lesion of left shoulder   . Lumbar stenosis 02/04/2018  . Hx of hysterectomy 08/13/2017  . Pain in left wrist 06/12/2017  . Chronic venous insufficiency 06/09/2017  . Varicose veins of both lower extremities with pain 04/30/2017  . Claudication ([place])  04/30/2017  . Arthritis of carpometacarpal [facility]) joint of left thumb 05/12/2016  . Bilateral thumb pain 05/01/2016  . Anemia 02/03/2016  . Status post bilateral total hip replacement 01/10/2016  . Sicca ([place]) 01/10/2016  . Age related osteoporosis 01/10/2016  . Hip arthritis 08/16/2015  . Microscopic hematuria 07/30/2015  . Vaginal atrophy 07/30/2015  . GAD (generalized anxiety disorder) 07/08/2015  . Moderate episode of recurrent major depressive disorder ([place]) 07/08/2015  . Migraine with aura and without status migrainosus, not intractable 07/08/2015  . Dyslipidemia 07/08/2015  . Degenerative arthritis of right knee 03/16/2015  . Atrophic vaginitis 01/31/2015  . Recurrent UTI 12/31/2014  . IBS (irritable bowel syndrome) 10/29/2014  . Asthma, moderate persistent 09/08/2014  . Allergic state 09/07/2014  . Colon polyp 09/07/2014  . Hemorrhoid 09/07/2014  . Constrictive tenosynovitis 03/12/2014  . H/O total knee replacement, bilateral 07/31/2013  . CAD (coronary artery disease) 11/28/2012  . Anxiety 11/28/2012  . Acid reflux 11/28/2012  . Cardiac murmur 11/28/2012  . BP (high blood pressure) 11/28/2012  . Cannot sleep 11/28/2012  . Adiposity 11/28/2012  . Restless leg 11/28/2012  . Supraventricular tachycardia ([place]) 11/28/2012  . Fibromyalgia 11/28/2012  . Tachycardia 11/28/2012  . Anginal pain (  [place]) 11/28/2012    Past Surgical History:  Procedure Laterality Date  . ABDOMINAL HYSTERECTOMY     due to cancer of ovaries  . BACK SURGERY N/A 2014   x 5  . BACK SURGERY  12/2017  . CARDIAC CATHETERIZATION     no PCI; 12/18/12 ([facility], Dr. Marcello Moores): EP study with ablation. No coronary angiography done then.  [name] CATARACT EXTRACTION W/PHACO Right 04/01/2019   Procedure: CATARACT EXTRACTION PHACO AND INTRAOCULAR LENS PLACEMENT (IOC) RIGHT  TORIC 6.19 00:40.1;  Surgeon: Birder Robson, MD;  Location: [facility];  Service: Ophthalmology;  Laterality: Right;  . CATARACT  EXTRACTION W/PHACO Left 04/29/2019   Procedure: CATARACT EXTRACTION PHACO AND INTRAOCULAR LENS PLACEMENT (IOC)  LEFT TORIC LENS 4.52  00:29.1;  Surgeon: Birder Robson, MD;  Location: [facility];  Service: Ophthalmology;  Laterality: Left;  . COLONOSCOPY W/ POLYPECTOMY    . COLONOSCOPY WITH PROPOFOL N/A 02/04/2016   Procedure: COLONOSCOPY WITH PROPOFOL;  Surgeon: Robert Bellow, MD;  Location[facility] ENDOSCOPY;  Service: Endoscopy;  Laterality: N/A;  . ESOPHAGOGASTRODUODENOSCOPY (EGD) WITH PROPOFOL N/A 02/04/2016   Procedure: ESOPHAGOGASTRODUODENOSCOPY (EGD) WITH PROPOFOL;  Surgeon: Robert Bellow, MD;  Location: [facility] ENDOSCOPY;  Service: Endoscopy;  Laterality: N/A;  . FINGER ARTHROSCOPY WITH CARPOMETACARPEL ([place]) ARTHROPLASTY Left 05/01/2016   Dr. Erlinda Hong  . GIVENS CAPSULE STUDY N/A 05/02/2016   Procedure: GIVENS CAPSULE STUDY;  Surgeon: Manya Silvas, MD;  Location[facility] ENDOSCOPY;  Service: Endoscopy;  Laterality: N/A;  . HAND SURGERY    . HEMORRHOID SURGERY    . HIP ARTHROPLASTY    . JOINT REPLACEMENT Bilateral    knee  . KNEE ARTHROPLASTY    . REPLACEMENT TOTAL KNEE Left   . SHOULDER ARTHROSCOPY Left 09/09/2018   Procedure: LEFT SHOULDER ARTHROSCOPY, BICEPS TENOTOMY;  Surgeon: Meredith Pel, MD;  Location: [facility];  Service: Orthopedics;  Laterality: Left;  . SPINE SURGERY     x 2  . tendonititis     right elbow, right wrist  . TOTAL HIP ARTHROPLASTY Left 2013  . TOTAL HIP ARTHROPLASTY Right 08/16/2015   Procedure: TOTAL HIP ARTHROPLASTY ANTERIOR APPROACH;  Surgeon: Meredith Pel, MD;  Location: [facility];  Service: Orthopedics;  Laterality: Right;  . TOTAL KNEE ARTHROPLASTY Right 03/16/2015   Procedure: RIGHT TOTAL KNEE ARTHROPLASTY, PCL SACRIFICING;  Surgeon: Meredith Pel, MD;  Location: [facility];  Service: Orthopedics;  Laterality: Right;  . TRIGGER FINGER RELEASE  08/18/2019     OB History    Gravida  4   Para      Term      Preterm       AB      Living        SAB      TAB      Ectopic      Multiple      Live Births  4        Obstetric Comments  1st Menstrual Cycle:  11 1st Pregnancy:  14         Family History  Problem Relation Age of Onset  . Diabetes Mother   . Hypertension Mother   . Heart disease Mother   . Alzheimer's disease Mother   . Diabetes Father   . Throat cancer Father   . Hypertension Sister   . Depression Sister   . Fibromyalgia Sister   . Cancer Brother   . Heart disease Brother   . Stroke Son   .  Fibromyalgia Daughter   . Bladder Cancer Neg Hx   . Kidney disease Neg Hx   . Kidney cancer Neg Hx     Social History   Tobacco Use  . Smoking status: Never Smoker  . Smokeless tobacco: Never Used  . Tobacco comment: smoking cessation materials not required  Vaping Use  . Vaping Use: Never used  Substance Use Topics  . Alcohol use: No    Alcohol/week: 0.0 standard drinks  . Drug use: No    Home Medications Prior to Admission medications   Medication Sig Start Date End Date Taking? Authorizing Provider  aspirin 81 MG chewable tablet Chew 81 mg by mouth daily.    [provider]  atorvastatin (LIPITOR) 40 MG tablet TAKE 1 TABLET BY MOUTH EVERYDAY AT BEDTIME 08/24/19   [name], MD  Cholecalciferol (D3-1000) 1000 units tablet Take 1,000 Units by mouth at bedtime.     [provider]  diclofenac sodium (VOLTAREN) 1 % GEL Apply 2 g topically 4 (four) times daily. 12/17/18   Aundra Dubin, PA-C  divalproex (DEPAKOTE) 250 MG DR tablet TAKE 1 TABLET TWICE A DAY Patient taking differently: Take 250 mg by mouth daily.  05/21/19   [name], MD  estradiol (ESTRACE) 0.1 MG/GM vaginal cream APPLY A PEA SIZED AMOUNT JUST INSIDE THE VAGINAL INTROITUS WITH A FINGER TIP EVERY MONDAY, WEDNESDAY, AND FRIDAY NIGHTS 06/02/19   Zara Council A, PA-C  ferrous sulfate 325 (65 FE) MG tablet Take 325 mg by mouth daily.    [provider]  gabapentin  (NEURONTIN) 300 MG capsule TAKE 2 CAPSULES 3 TIMES A  DAY 09/08/19   Bo Merino, MD  hydrOXYzine (VISTARIL) 25 MG capsule TAKE 1 CAPSULE 3 TIMES A   DAY AS NEEDED FOR ANXIETY 07/21/19   Ancil Boozer, Drue Stager, MD  lansoprazole (PREVACID) 30 MG capsule Take 1 capsule (30 mg total) daily by mouth. Patient taking differently: Take 30 mg by mouth 2 (two) times daily before a meal.  01/11/17 09/17/19  Sowles, Drue Stager, MD  levalbuterol (XOPENEX) 1.25 MG/3ML nebulizer solution INHALE THE CONTENTS OF 1   VIAL VIA NEBULIZER EVERY 4 HOURS AS NEEDED FOR        WHEEZING 09/10/19   Sowles, Drue Stager, MD  levothyroxine (SYNTHROID, LEVOTHROID) 50 MCG tablet Take 50 mcg by mouth daily before breakfast.    [provider]  LINZESS 145 MCG CAPS capsule TAKE 1 CAPSULE DAILY BEFOREBREAKFAST 12/04/18   [name], MD  MAGNESIUM OXIDE PO Take 400 mg by mouth at bedtime. Reported on 07/28/2015    [provider]  methocarbamol (ROBAXIN) 500 MG tablet Take 1 tablet (500 mg total) by mouth every 8 (eight) hours as needed. 08/18/19   Magnant, Charles L, PA-C  mometasone-formoterol (DULERA) 200-5 MCG/ACT AERO Inhale 2 puffs into the lungs 2 (two) times daily. 08/22/19   [name], MD  montelukast (SINGULAIR) 10 MG tablet TAKE 1 TABLET AT BEDTIME 07/01/19   Sowles, Drue Stager, MD  nystatin (MYCOSTATIN) 100000 UNIT/ML suspension TAKE 5 MLS (500,000 UNITS TOTAL) BY MOUTH 4 (FOUR) TIMES DAILY. 08/24/19   [name], MD  olmesartan-hydrochlorothiazide (BENICAR HCT) 40-12.5 MG tablet Take 0.5 tablets by mouth daily. 05/05/19   [name], MD  omega-3 acid ethyl esters (LOVAZA) 1 g capsule TAKE 2 CAPSULES TWICE DAILY 07/06/19   Ancil Boozer, Drue Stager, MD  ondansetron (ZOFRAN) 4 MG tablet Take 1 tablet (4 mg total) by mouth every 8 (eight) hours as needed for nausea or vomiting. 01/27/19  [name], MD  oxyCODONE (OXY IR/ROXICODONE) 5 MG immediate release tablet Take 5 mg by mouth every 8 (eight) hours as needed.  09/09/18   [provider]  RESTASIS 0.05 % ophthalmic emulsion Place 1 drop into both eyes 2 (two) times daily.  06/03/18   [provider]  traMADol (ULTRAM) 50 MG tablet Take 1 tablet (50 mg total) by mouth every 6 (six) hours as needed. 08/18/19 08/17/20  Magnant, Gerrianne Scale, PA-C  valACYclovir (VALTREX) 1000 MG tablet Take 1 tablet (1,000 mg total) by mouth 2 (two) times daily. 01/20/19   [name], MD  venlafaxine XR (EFFEXOR XR) 37.5 MG 24 hr capsule Take 1 capsule (37.5 mg total) by mouth daily with breakfast. 08/22/19   [name], MD  vitamin B-12 (CYANOCOBALAMIN) 1000 MCG tablet Take 1,000 mcg by mouth at bedtime.    [provider]  albuterol (PROAIR HFA) 108 (90 BASE) MCG/ACT inhaler Inhale 2 puffs into the lungs every 6 (six) hours as needed for wheezing or shortness of breath. 12/15/14 09/03/18  Ashok Norris, MD    Allergies    Morphine, Lyrica [pregabalin], Metformin and related, Diazepam, Latex, Rash away  [petrolatum-zinc oxide], Salicylates, and Tape  Review of Systems   Review of Systems  Unable to perform ROS: Severe respiratory distress    Physical Exam Updated Vital Signs BP (!) 121/56   Pulse 71   Temp 100.1 F (37.8 C)   Resp 20   SpO2 98%   Physical Exam Vitals and nursing note reviewed.  Constitutional:      General: She is in acute distress.     Appearance: She is well-developed. She is obese. She is ill-appearing.  HENT:     Head: Normocephalic and atraumatic.     Right Ear: External ear normal.     Left Ear: External ear normal.     Nose: Nose normal.  Eyes:     General:        Right eye: No discharge.        Left eye: No discharge.  Cardiovascular:     Rate and Rhythm: Normal rate and regular rhythm.     Heart sounds: Normal heart sounds.  Pulmonary:     Effort: Tachypnea and accessory muscle usage present.     Breath sounds: Normal breath sounds.  Abdominal:     General: There is no distension.    Musculoskeletal:     Right lower leg: No edema.     Left lower leg: No edema.  Skin:    General: Skin is warm and dry.  Neurological:     Mental Status: She is alert and oriented to person, place, and time.  Psychiatric:        Mood and Affect: Mood is not anxious.     ED Results / Procedures / Treatments   Labs (all labs ordered are listed, but only abnormal results are displayed) Labs Reviewed  SARS CORONAVIRUS 2 BY RT PCR (HOSPITAL ORDER, [place] [facility]) - Abnormal; Notable for the following components:      Result Value   SARS Coronavirus 2 POSITIVE (*)    All other components within normal limits  CBC WITH DIFFERENTIAL/PLATELET - Abnormal; Notable for the following components:   Abs Immature Granulocytes 0.18 (*)    All other components within normal limits  URINALYSIS, ROUTINE W REFLEX MICROSCOPIC - Abnormal; Notable for the following components:   Color, Urine AMBER (*)    APPearance HAZY (*)  Protein, ur 30 (*)    All other components within normal limits  D-DIMER, QUANTITATIVE (NOT AT [facility]) - Abnormal; Notable for the following components:   D-Dimer, Quant 1.57 (*)    All other components within normal limits  FIBRINOGEN - Abnormal; Notable for the following components:   Fibrinogen 785 (*)    All other components within normal limits  I-STAT ARTERIAL BLOOD GAS, ED - Abnormal; Notable for the following components:   pO2, Arterial 133 (*)    Acid-base deficit 3.0 (*)    Sodium 130 (*)    Potassium 3.3 (*)    Calcium, Ion 1.03 (*)    HCT 31.0 (*)    Hemoglobin 10.5 (*)    All other components within normal limits  CULTURE, BLOOD (ROUTINE X 2)  CULTURE, BLOOD (ROUTINE X 2)  URINE CULTURE  LACTIC ACID, PLASMA  COMPREHENSIVE METABOLIC PANEL  LACTIC ACID, PLASMA  PROCALCITONIN  LACTATE DEHYDROGENASE  FERRITIN  C-REACTIVE PROTEIN  HIV ANTIBODY (ROUTINE TESTING W REFLEX)  PROTIME-INR  TRIGLYCERIDES  TROPONIN I (HIGH SENSITIVITY)     EKG None  Radiology DG Abdomen 1 View  Result Date: 09/30/2019 CLINICAL DATA:  OG tube EXAM: ABDOMEN - 1 VIEW COMPARISON:  07/16/2015 FINDINGS: Esophageal tube tip in the left upper quadrant overlying proximal stomach. Possible small kink in the distal tube. Basilar airspace disease IMPRESSION: Esophageal tube tip in the left upper quadrant overlying the proximal stomach, possible small kink in the distal tube. Electronically Signed   By: Donavan Foil M.D.   On: 10/28/2019 21:26   DG Chest Portable 1 View  Result Date: 10/08/2019 CLINICAL DATA:  COVID intubated EXAM: PORTABLE CHEST 1 VIEW COMPARISON:  10/14/2019 FINDINGS: Endotracheal tube tip is about 4.6 cm superior to the carina. Esophageal tube tip in the left upper quadrant. Extensive bilateral ground-glass opacities and consolidations, worse as compared with 10/14/2019. Stable cardiomediastinal silhouette with aortic atherosclerosis. No pneumothorax. IMPRESSION: 1. Endotracheal tube tip about 4.6 cm superior to the carina. 2. Worsening bilateral ground-glass opacities and consolidations consistent with pneumonia Electronically Signed   By: Donavan Foil M.D.   On: 10/14/2019 21:26    Procedures Procedure Name: Intubation Date/Time: 10/01/2019 7:46 PM Performed by: Sherwood Gambler, MD Pre-anesthesia Checklist: Patient identified, Patient being monitored, Emergency Drugs available, Timeout performed and Suction available Oxygen Delivery Method: Non-rebreather mask Preoxygenation: Pre-oxygenation with 100% oxygen Induction Type: Rapid sequence Laryngoscope Size: Glidescope and 3 Grade View: Grade II Tube size: 7.5 mm Airway Equipment and Method: Video-laryngoscopy Placement Confirmation: ETT inserted through vocal cords under direct vision,  CO2 detector and Breath sounds checked- equal and bilateral Secured at: 20 cm Tube secured with: ETT holder Dental Injury: Teeth and Oropharynx as per pre-operative assessment       .Critical Care Performed by: Sherwood Gambler, MD Authorized by: Sherwood Gambler, MD   Critical care provider statement:    Critical care time (minutes):  50   Critical care time was exclusive of:  Separately billable procedures and treating other patients   Critical care was necessary to treat or prevent imminent or life-threatening deterioration of the following conditions:  Respiratory failure   Critical care was time spent personally by me on the following activities:  Discussions with consultants, evaluation of patient's response to treatment, examination of patient, ordering and performing treatments and interventions, ordering and review of laboratory studies, ordering and review of radiographic studies, pulse oximetry, re-evaluation of patient's condition, obtaining history from patient or surrogate, review of old charts and  ventilator management   (including critical care time)  Medications Ordered in ED Medications  etomidate (AMIDATE) injection 27.22 mg (has no administration in time range)  succinylcholine (ANECTINE) injection 100 mg (has no administration in time range)  fentaNYL 2577mg in NS 2565m(1062mml) infusion-PREMIX (400 mcg/hr Intravenous Rate/Dose Change 10/25/2019 1933)  fentaNYL (SUBLIMAZE) bolus via infusion 25 mcg (has no administration in time range)  fentaNYL (SUBLIMAZE) injection 50 mcg (50 mcg Intravenous Not Given 10/05/2019 2002)  enoxaparin (LOVENOX) injection 45 mg (has no administration in time range)  chlorhexidine gluconate (MEDLINE KIT) (PERIDEX) 0.12 % solution 15 mL (has no administration in time range)  MEDLINE mouth rinse (has no administration in time range)  pantoprazole (PROTONIX) injection 40 mg (has no administration in time range)  sodium chloride flush (NS) 0.9 % injection 3 mL (has no administration in time range)  sodium chloride flush (NS) 0.9 % injection 3 mL (has no administration in time range)  0.9 %  sodium chloride infusion (has  no administration in time range)  remdesivir 200 mg in sodium chloride 0.9% 250 mL IVPB (0 mg Intravenous Stopped 10/28/2019 2218)    Followed by  remdesivir 100 mg in sodium chloride 0.9 % 100 mL IVPB (has no administration in time range)  0.9 %  sodium chloride infusion (has no administration in time range)  norepinephrine (LEVOPHED) 4mg58m 250mL41mmix infusion (10 mcg/min Intravenous Rate/Dose Change 10/08/2019 2040)  acetaminophen (TYLENOL) tablet 650 mg (has no administration in time range)  methylPREDNISolone sodium succinate (SOLU-MEDROL) 125 mg/2 mL injection 45.625 mg (45.625 mg Intravenous Given 10/23/2019 2138)  docusate (COLACE) 50 MG/5ML liquid 100 mg (has no administration in time range)  midazolam (VERSED) 50 mg/50 mL (1 mg/mL) premix infusion (4 mg/hr Intravenous Rate/Dose Change 10/15/2019 2208)  midazolam (VERSED) bolus via infusion 1-2 mg (has no administration in time range)  propofol (DIPRIVAN) 1000 MG/100ML infusion (0 mcg/kg/min  90.7 kg Intravenous Stopped 10/03/2019 2135)  ipratropium-albuterol (DUONEB) 0.5-2.5 (3) MG/3ML nebulizer solution 3 mL (has no administration in time range)  atorvastatin (LIPITOR) tablet 40 mg (has no administration in time range)  sodium chloride 0.9 % bolus 1,000 mL (0 mLs Intravenous Stopped 10/24/2019 1929)  etomidate (AMIDATE) injection (27 mg Intravenous Given 10/15/2019 1840)  succinylcholine (ANECTINE) injection (100 mg Intravenous Given 10/04/2019 1840)  fentaNYL (SUBLIMAZE) injection (100 mcg Intravenous Given 10/18/2019 1852)  midazolam (VERSED) 5 MG/5ML injection (2 mg Intravenous Given 10/25/2019 1853)  acetaminophen (TYLENOL) suppository 650 mg (650 mg Rectal Given 10/07/2019 2131)    ED Course  I have reviewed the triage vital signs and the nursing notes.  Pertinent labs & imaging results that were available during my care of the patient were reviewed by me and considered in my medical decision making (see chart for details).  Clinical Course as of Oct 16 2229  Fri Oct 17, 2019  1900 Given high concern this is covid, will hold on antibiotics. ICU consulted for admission   [SG]    Clinical Course User Index [SG] GoldsSherwood Gambler  MDM Rules/Calculators/A&P                          Patient's mental status was improving with nonrebreather and nasal cannula together.  Her O2 sats came up into the 80s but did not get higher.  She was still working pretty hard to breathe and was mentioning that she was getting tired.  While her mental status was better, decision  was made to intubate, especially as she is likely to get worse before she gets better.  ICU was consulted and Dr. Duwayne Heck will admit.  Initially had a hard time with sedation as she was started on fentanyl and as needed Versed given the soft blood pressures.  She was then started on propofol with better response.   LATOYNA HIRD was evaluated in Emergency Department on 10/07/2019 for the symptoms described in the history of present illness. She was evaluated in the context of the global COVID-19 pandemic, which necessitated consideration that the patient might be at risk for infection with the SARS-CoV-2 virus that causes COVID-19. Institutional protocols and algorithms that pertain to the evaluation of patients at risk for COVID-19 are in a state of rapid change based on information released by regulatory bodies including the CDC and federal and state organizations. These policies and algorithms were followed during the patient's care in the ED.  Final Clinical Impression(s) / ED Diagnoses Final diagnoses:  Acute respiratory failure with hypoxia ([place])  Pneumonia due to COVID-19 virus    Rx / DC Orders ED Discharge Orders    None       Sherwood Gambler, MD 09/30/2019 2232

## 2019-10-17 NOTE — ED Notes (Signed)
Lab tech reported CMP would have to be sent out to [facility] for testing due to machine being down

## 2019-10-17 NOTE — ED Triage Notes (Signed)
Reported recently seen at [facility] last 2 days ago and was diagnose with Covid; c/o worsening symptoms.

## 2019-10-18 ENCOUNTER — Inpatient Hospital Stay (HOSPITAL_COMMUNITY): Payer: Medicare Other

## 2019-10-18 LAB — POCT I-STAT 7, (LYTES, BLD GAS, ICA,H+H)
Acid-base deficit: 3 mmol/L — ABNORMAL HIGH (ref 0.0–2.0)
Acid-base deficit: 4 mmol/L — ABNORMAL HIGH (ref 0.0–2.0)
Bicarbonate: 19.8 mmol/L — ABNORMAL LOW (ref 20.0–28.0)
Bicarbonate: 21.9 mmol/L (ref 20.0–28.0)
Calcium, Ion: 1.08 mmol/L — ABNORMAL LOW (ref 1.15–1.40)
Calcium, Ion: 1.18 mmol/L (ref 1.15–1.40)
HCT: 35 % — ABNORMAL LOW (ref 36.0–46.0)
HCT: 38 % (ref 36.0–46.0)
Hemoglobin: 11.9 g/dL — ABNORMAL LOW (ref 12.0–15.0)
Hemoglobin: 12.9 g/dL (ref 12.0–15.0)
O2 Saturation: 100 %
O2 Saturation: 89 %
Patient temperature: 35.9
Patient temperature: 98.1
Potassium: 3.7 mmol/L (ref 3.5–5.1)
Potassium: 3.7 mmol/L (ref 3.5–5.1)
Sodium: 130 mmol/L — ABNORMAL LOW (ref 135–145)
Sodium: 139 mmol/L (ref 135–145)
TCO2: 21 mmol/L — ABNORMAL LOW (ref 22–32)
TCO2: 23 mmol/L (ref 22–32)
pCO2 arterial: 32.5 mmHg (ref 32.0–48.0)
pCO2 arterial: 34.8 mmHg (ref 32.0–48.0)
pH, Arterial: 7.391 (ref 7.350–7.450)
pH, Arterial: 7.402 (ref 7.350–7.450)
pO2, Arterial: 229 mmHg — ABNORMAL HIGH (ref 83.0–108.0)
pO2, Arterial: 52 mmHg — ABNORMAL LOW (ref 83.0–108.0)

## 2019-10-18 LAB — TSH: TSH: 0.918 u[IU]/mL (ref 0.350–4.500)

## 2019-10-18 LAB — COMPREHENSIVE METABOLIC PANEL
ALT: 21 U/L (ref 0–44)
AST: 61 U/L — ABNORMAL HIGH (ref 15–41)
Albumin: 2.7 g/dL — ABNORMAL LOW (ref 3.5–5.0)
Alkaline Phosphatase: 47 U/L (ref 38–126)
Anion gap: 19 — ABNORMAL HIGH (ref 5–15)
BUN: 33 mg/dL — ABNORMAL HIGH (ref 8–23)
CO2: 16 mmol/L — ABNORMAL LOW (ref 22–32)
Calcium: 7.4 mg/dL — ABNORMAL LOW (ref 8.9–10.3)
Chloride: 95 mmol/L — ABNORMAL LOW (ref 98–111)
Creatinine, Ser: 1.57 mg/dL — ABNORMAL HIGH (ref 0.44–1.00)
GFR calc Af Amer: 39 mL/min — ABNORMAL LOW (ref >60)
GFR calc non Af Amer: 33 mL/min — ABNORMAL LOW (ref >60)
Glucose, Bld: 125 mg/dL — ABNORMAL HIGH (ref 70–99)
Potassium: 3.7 mmol/L (ref 3.5–5.1)
Sodium: 130 mmol/L — ABNORMAL LOW (ref 135–145)
Total Bilirubin: 1.6 mg/dL — ABNORMAL HIGH (ref 0.3–1.2)
Total Protein: 6.3 g/dL — ABNORMAL LOW (ref 6.5–8.1)

## 2019-10-18 LAB — HEMOGLOBIN A1C
Hgb A1c MFr Bld: 6.5 % — ABNORMAL HIGH (ref 4.8–5.6)
Mean Plasma Glucose: 139.85 mg/dL

## 2019-10-18 LAB — GLUCOSE, CAPILLARY
Glucose-Capillary: 181 mg/dL — ABNORMAL HIGH (ref 70–99)
Glucose-Capillary: 185 mg/dL — ABNORMAL HIGH (ref 70–99)
Glucose-Capillary: 186 mg/dL — ABNORMAL HIGH (ref 70–99)
Glucose-Capillary: 205 mg/dL — ABNORMAL HIGH (ref 70–99)
Glucose-Capillary: 205 mg/dL — ABNORMAL HIGH (ref 70–99)
Glucose-Capillary: 224 mg/dL — ABNORMAL HIGH (ref 70–99)

## 2019-10-18 LAB — BASIC METABOLIC PANEL
Anion gap: 14 (ref 5–15)
BUN: 30 mg/dL — ABNORMAL HIGH (ref 8–23)
CO2: 17 mmol/L — ABNORMAL LOW (ref 22–32)
Calcium: 7.8 mg/dL — ABNORMAL LOW (ref 8.9–10.3)
Chloride: 101 mmol/L (ref 98–111)
Creatinine, Ser: 1.4 mg/dL — ABNORMAL HIGH (ref 0.44–1.00)
GFR calc Af Amer: 44 mL/min — ABNORMAL LOW (ref >60)
GFR calc non Af Amer: 38 mL/min — ABNORMAL LOW (ref >60)
Glucose, Bld: 225 mg/dL — ABNORMAL HIGH (ref 70–99)
Potassium: 3.5 mmol/L (ref 3.5–5.1)
Sodium: 132 mmol/L — ABNORMAL LOW (ref 135–145)

## 2019-10-18 LAB — MRSA PCR SCREENING: MRSA by PCR: NEGATIVE

## 2019-10-18 LAB — PHOSPHORUS: Phosphorus: 3.1 mg/dL (ref 2.5–4.6)

## 2019-10-18 LAB — MAGNESIUM: Magnesium: 2.2 mg/dL (ref 1.7–2.4)

## 2019-10-18 MED ORDER — PANTOPRAZOLE SODIUM 40 MG PO TBEC: 40.0000 mg | DELAYED_RELEASE_TABLET | Freq: Every day | ORAL | Status: DC

## 2019-10-18 MED ORDER — PANTOPRAZOLE SODIUM 40 MG PO PACK
40.0000 mg | PACK | Freq: Every day | ORAL | Status: DC
  Administered 2019-10-18 – 2019-11-12 (×26): 40 mg
  Filled 2019-10-18 (×26): qty 20

## 2019-10-18 MED ORDER — DOCUSATE SODIUM 50 MG/5ML PO LIQD
100.0000 mg | Freq: Two times a day (BID) | ORAL | Status: DC
  Administered 2019-10-18 – 2019-10-21 (×6): 100 mg via ORAL
  Filled 2019-10-18 (×6): qty 10

## 2019-10-18 MED ORDER — FENTANYL CITRATE (PF) 100 MCG/2ML IJ SOLN
25.0000 ug | INTRAMUSCULAR | Status: DC | PRN
  Administered 2019-10-18 – 2019-10-19 (×3): 50 ug via INTRAVENOUS
  Administered 2019-10-27 – 2019-10-28 (×2): 100 ug via INTRAVENOUS
  Administered 2019-10-29: 50 ug via INTRAVENOUS
  Filled 2019-10-18 (×2): qty 2

## 2019-10-18 MED ORDER — DEXMEDETOMIDINE HCL IN NACL 400 MCG/100ML IV SOLN
0.0000 ug/kg/h | INTRAVENOUS | Status: DC
  Administered 2019-10-18: 0.4 ug/kg/h via INTRAVENOUS
  Administered 2019-10-18 – 2019-10-19 (×2): 0.8 ug/kg/h via INTRAVENOUS
  Administered 2019-10-19: 1 ug/kg/h via INTRAVENOUS
  Filled 2019-10-18 (×4): qty 100

## 2019-10-18 MED ORDER — PROSOURCE TF PO LIQD
90.0000 mL | Freq: Every day | ORAL | Status: DC
  Administered 2019-10-19 – 2019-10-31 (×13): 90 mL
  Filled 2019-10-18 (×13): qty 90

## 2019-10-18 MED ORDER — CHLORHEXIDINE GLUCONATE CLOTH 2 % EX PADS
6.0000 | MEDICATED_PAD | Freq: Every day | CUTANEOUS | Status: DC
  Administered 2019-10-18 – 2019-10-27 (×11): 6 via TOPICAL

## 2019-10-18 MED ORDER — PROSOURCE TF PO LIQD: 45.0000 mL | Freq: Two times a day (BID) | ORAL | Status: DC

## 2019-10-18 MED ORDER — POLYETHYLENE GLYCOL 3350 17 G PO PACK
17.0000 g | PACK | Freq: Every day | ORAL | Status: DC
  Administered 2019-10-19 – 2019-10-21 (×3): 17 g via ORAL
  Filled 2019-10-18 (×3): qty 1

## 2019-10-18 MED ORDER — BARICITINIB 2 MG PO TABS
2.0000 mg | ORAL_TABLET | Freq: Every day | ORAL | Status: DC
  Administered 2019-10-18 – 2019-10-21 (×4): 2 mg
  Filled 2019-10-18 (×4): qty 1

## 2019-10-18 MED ORDER — MIDAZOLAM HCL 2 MG/2ML IJ SOLN
1.0000 mg | INTRAMUSCULAR | Status: DC | PRN
  Filled 2019-10-18: qty 2

## 2019-10-18 MED ORDER — VITAL HIGH PROTEIN PO LIQD
1000.0000 mL | ORAL | Status: DC
  Administered 2019-10-18 – 2019-10-31 (×12): 1000 mL
  Filled 2019-10-18 (×9): qty 1000

## 2019-10-18 MED ORDER — VITAL HIGH PROTEIN PO LIQD: 1000.0000 mL | ORAL | Status: DC

## 2019-10-18 MED ORDER — FENTANYL CITRATE (PF) 100 MCG/2ML IJ SOLN: 25.0000 ug | INTRAMUSCULAR | Status: DC | PRN

## 2019-10-18 MED ORDER — MIDAZOLAM HCL 2 MG/2ML IJ SOLN
1.0000 mg | INTRAMUSCULAR | Status: AC | PRN
  Administered 2019-10-18 – 2019-10-21 (×3): 1 mg via INTRAVENOUS
  Filled 2019-10-18: qty 2

## 2019-10-18 MED ORDER — INSULIN ASPART 100 UNIT/ML ~~LOC~~ SOLN
0.0000 [IU] | SUBCUTANEOUS | Status: DC
  Administered 2019-10-18 (×2): 7 [IU] via SUBCUTANEOUS
  Administered 2019-10-18: 4 [IU] via SUBCUTANEOUS
  Administered 2019-10-18 – 2019-10-19 (×5): 7 [IU] via SUBCUTANEOUS
  Administered 2019-10-19: 4 [IU] via SUBCUTANEOUS
  Administered 2019-10-20: 7 [IU] via SUBCUTANEOUS
  Administered 2019-10-20 (×2): 4 [IU] via SUBCUTANEOUS
  Administered 2019-10-20: 3 [IU] via SUBCUTANEOUS
  Administered 2019-10-20: 7 [IU] via SUBCUTANEOUS
  Administered 2019-10-20 (×2): 4 [IU] via SUBCUTANEOUS
  Administered 2019-10-21: 7 [IU] via SUBCUTANEOUS
  Administered 2019-10-21 (×3): 4 [IU] via SUBCUTANEOUS
  Administered 2019-10-21: 3 [IU] via SUBCUTANEOUS
  Administered 2019-10-22: 7 [IU] via SUBCUTANEOUS
  Administered 2019-10-22 (×3): 4 [IU] via SUBCUTANEOUS
  Administered 2019-10-22: 3 [IU] via SUBCUTANEOUS
  Administered 2019-10-22: 4 [IU] via SUBCUTANEOUS
  Administered 2019-10-23: 7 [IU] via SUBCUTANEOUS
  Administered 2019-10-23: 4 [IU] via SUBCUTANEOUS
  Administered 2019-10-23: 7 [IU] via SUBCUTANEOUS
  Administered 2019-10-23 (×2): 4 [IU] via SUBCUTANEOUS
  Administered 2019-10-23: 7 [IU] via SUBCUTANEOUS
  Administered 2019-10-23: 4 [IU] via SUBCUTANEOUS
  Administered 2019-10-24: 7 [IU] via SUBCUTANEOUS
  Administered 2019-10-24 (×4): 4 [IU] via SUBCUTANEOUS
  Administered 2019-10-24: 7 [IU] via SUBCUTANEOUS
  Administered 2019-10-25: 3 [IU] via SUBCUTANEOUS
  Administered 2019-10-25: 4 [IU] via SUBCUTANEOUS
  Administered 2019-10-25 (×2): 3 [IU] via SUBCUTANEOUS
  Administered 2019-10-25: 7 [IU] via SUBCUTANEOUS
  Administered 2019-10-25 – 2019-10-27 (×3): 3 [IU] via SUBCUTANEOUS
  Administered 2019-10-27 (×3): 4 [IU] via SUBCUTANEOUS
  Administered 2019-10-28 (×2): 3 [IU] via SUBCUTANEOUS
  Administered 2019-10-28: 4 [IU] via SUBCUTANEOUS
  Administered 2019-10-28: 7 [IU] via SUBCUTANEOUS
  Administered 2019-10-28: 3 [IU] via SUBCUTANEOUS
  Administered 2019-10-29: 4 [IU] via SUBCUTANEOUS
  Administered 2019-10-29: 3 [IU] via SUBCUTANEOUS
  Administered 2019-10-29: 7 [IU] via SUBCUTANEOUS
  Administered 2019-10-29 (×2): 3 [IU] via SUBCUTANEOUS
  Administered 2019-10-29 – 2019-10-30 (×5): 4 [IU] via SUBCUTANEOUS
  Administered 2019-10-30: 3 [IU] via SUBCUTANEOUS
  Administered 2019-10-31: 4 [IU] via SUBCUTANEOUS
  Administered 2019-10-31 (×5): 3 [IU] via SUBCUTANEOUS
  Administered 2019-10-31 – 2019-11-01 (×2): 7 [IU] via SUBCUTANEOUS
  Administered 2019-11-01 (×2): 3 [IU] via SUBCUTANEOUS
  Administered 2019-11-01 – 2019-11-02 (×8): 4 [IU] via SUBCUTANEOUS
  Administered 2019-11-02 – 2019-11-03 (×2): 3 [IU] via SUBCUTANEOUS
  Administered 2019-11-03 (×3): 4 [IU] via SUBCUTANEOUS
  Administered 2019-11-03: 3 [IU] via SUBCUTANEOUS
  Administered 2019-11-03 – 2019-11-04 (×2): 4 [IU] via SUBCUTANEOUS
  Administered 2019-11-04: 3 [IU] via SUBCUTANEOUS
  Administered 2019-11-04: 4 [IU] via SUBCUTANEOUS
  Administered 2019-11-04: 3 [IU] via SUBCUTANEOUS
  Administered 2019-11-04: 4 [IU] via SUBCUTANEOUS
  Administered 2019-11-05: 3 [IU] via SUBCUTANEOUS
  Administered 2019-11-05 (×2): 4 [IU] via SUBCUTANEOUS
  Administered 2019-11-05 – 2019-11-06 (×5): 3 [IU] via SUBCUTANEOUS
  Administered 2019-11-06 (×2): 4 [IU] via SUBCUTANEOUS
  Administered 2019-11-06: 3 [IU] via SUBCUTANEOUS
  Administered 2019-11-07 (×2): 4 [IU] via SUBCUTANEOUS
  Administered 2019-11-07 – 2019-11-08 (×2): 3 [IU] via SUBCUTANEOUS
  Administered 2019-11-08: 1 [IU] via SUBCUTANEOUS
  Administered 2019-11-09: 3 [IU] via SUBCUTANEOUS
  Administered 2019-11-09: 7 [IU] via SUBCUTANEOUS
  Administered 2019-11-09 (×2): 4 [IU] via SUBCUTANEOUS
  Administered 2019-11-09: 3 [IU] via SUBCUTANEOUS
  Administered 2019-11-09 – 2019-11-10 (×4): 4 [IU] via SUBCUTANEOUS
  Administered 2019-11-10: 3 [IU] via SUBCUTANEOUS
  Administered 2019-11-10: 7 [IU] via SUBCUTANEOUS
  Administered 2019-11-11 (×2): 3 [IU] via SUBCUTANEOUS
  Administered 2019-11-11: 11 [IU] via SUBCUTANEOUS

## 2019-10-18 MED ORDER — VENLAFAXINE HCL 37.5 MG PO TABS
37.5000 mg | ORAL_TABLET | Freq: Every morning | ORAL | Status: DC
  Administered 2019-10-18 – 2019-10-21 (×4): 37.5 mg via ORAL
  Filled 2019-10-18 (×4): qty 1

## 2019-10-18 NOTE — Progress Notes (Signed)
SLP Cancellation Note  Patient Details Name: SINDIA KOWALCZYK MRN: [id number] DOB: Jul 03, 1950   Cancelled treatment:       Reason Eval/Treat Not Completed: Medical issues which prohibited therapy (Patient remains on vent. Will f/u 8/22. )  Gabriel Rainwater MA, CCC-SLP   Lunabelle Oatley Meryl 10/18/2019, 12:36 PM

## 2019-10-18 NOTE — Progress Notes (Signed)
NAME:  Jacqueline Lucas, MRN:  [id number], DOB:  Dec 28, 1950, LOS: 1 ADMISSION DATE:  10/05/2019, CONSULTATION DATE:  8/20 REFERRING MD:  EDP, CHIEF COMPLAINT:  Dyspnea   Brief History   69 y/o female admitted on 8/19 with severe acute respiratory failure with hypoxemia due to COVID 19 pneumonia.  Past Medical History  Asthma Obesity Hypertension Hyperlipidemia Degenerative disk disease Depression Fibromyalgia Heart murmur GERD Hypothyroidism IDA Insomnia Fe def anemia  Significant Hospital Events   8/20 Admit to PCCM  Consults:    Procedures:  8/20 ETT > 8/21  Significant Diagnostic Tests:    Micro Data:  8/20 SARS COV 2>>positive 8/20 UC>> 8/20 BCx2>>  Antimicrobials/COVID  8/20 Remdesivir >  8/20 baricitinib >  8/20 solumedrol >  Interim history/subjective:  No acute issues Weaning on ventilator this morning Waking up, following commands  Objective   Blood pressure (!) 106/56, pulse (!) 48, temperature (!) 96.1 F (35.6 C), resp. rate (!) 28, weight 94.3 kg, SpO2 94 %.    Vent Mode: PRVC FiO2 (%):  [50 %-100 %] 50 % Set Rate:  [20 bmp-28 bmp] 28 bmp Vt Set:  [320 mL-430 mL] 320 mL PEEP:  [8 cmH20-12 cmH20] 8 cmH20 Plateau Pressure:  [18 cmH20-27 cmH20] 18 cmH20   Intake/Output Summary (Last 24 hours) at 10/18/2019 0160 Last data filed at 10/18/2019 0600 Gross per 24 hour  Intake 783.09 ml  Output 1170 ml  Net -386.91 ml   Filed Weights   10/18/19 0500  Weight: 94.3 kg    Examination: General:  In bed on vent HENT: NCAT ETT in place PULM: CTA B, vent supported breathing CV: RRR, no mgr GI: BS+, soft, nontender MSK: normal bulk and tone Neuro: sleepy but wakes to voice, follows commands  8/21 CXR images personally reviewed showing severe bilateral airspace disease  Resolved Hospital Problem list     Assessment & Plan:  ARDS cue to COVID 19 pneumonia> improved oxygenation and normal ventilatory mechanics Extubate today Will need  high flow oxygen after Advance diet if awake, talking after extubation Remdesivir 5 days Solumedrol 1mg /kg IV daliy x 3-5 days then prednisone Baricitinib to continue  Hypothyroidism Synthroid  AKI> improved Monitor BMET and UOP Replace electrolytes as needed Renal dose medications  Depression Continue home effexor divalproate  Hypertension Monitor  GERD Pantoprazole for stress ulcer prophylaxis    Best practice:  Diet: hold tube feeding for now Pain/Anxiety/Delirium protocol (if indicated): d/c today VAP protocol (if indicated): d/c DVT prophylaxis: lovenox GI prophylaxis: Pantoprazole for stress ulcer prophylaxis Glucose control: SSI Mobility: out of bed after extubation Code Status: full Family Communication: I called her son, left a message. Disposition: reman in ICU  Labs   CBC: Recent Labs  Lab 10/14/19 1917 10/16/2019 1956 10/08/2019 2044 10/18/19 0029  WBC 4.1  --  9.3  --   NEUTROABS  --   --  7.5  --   HGB 14.5 10.5* 12.8 11.9*  HCT 40.3 31.0* 36.5 35.0*  MCV 88.6  --  91.9  --   PLT 134*  --  217  --     Basic Metabolic Panel: Recent Labs  Lab 10/14/19 1917 10/16/2019 1956 10/01/2019 2044 10/18/19 0029 10/18/19 0101  NA 135 130* 130* 130* 132*  K 3.6 3.3* 3.7 3.7 3.5  CL 102  --  95*  --  101  CO2 24  --  16*  --  17*  GLUCOSE 145*  --  125*  --  225*  BUN 8  --  33*  --  30*  CREATININE 0.57  --  1.57*  --  1.40*  CALCIUM 8.4*  --  7.4*  --  7.8*   GFR: Estimated Creatinine Clearance: 42.2 mL/min (A) (by C-G formula based on SCr of 1.4 mg/dL (H)). Recent Labs  Lab 10/14/19 1917 10/03/2019 2043 10/25/2019 2044 10/07/2019 2100 10/03/2019 2220  PROCALCITON  --   --   --  <0.10  --   WBC 4.1  --  9.3  --   --   LATICACIDVEN  --  1.5  --   --  0.9    Liver Function Tests: Recent Labs  Lab 10/07/2019 2044  AST 61*  ALT 21  ALKPHOS 47  BILITOT 1.6*  PROT 6.3*  ALBUMIN 2.7*   No results for input(s): LIPASE, AMYLASE in the last 168  hours. No results for input(s): AMMONIA in the last 168 hours.  ABG    Component Value Date/Time   PHART 7.391 10/18/2019 0029   PCO2ART 32.5 10/18/2019 0029   PO2ART 229 (H) 10/18/2019 0029   HCO3 19.8 (L) 10/18/2019 0029   TCO2 21 (L) 10/18/2019 0029   ACIDBASEDEF 4.0 (H) 10/18/2019 0029   O2SAT 100.0 10/18/2019 0029     Coagulation Profile: Recent Labs  Lab 10/28/2019 2221  INR 1.2    Cardiac Enzymes: No results for input(s): CKTOTAL, CKMB, CKMBINDEX, TROPONINI in the last 168 hours.  HbA1C: Hgb A1c MFr Bld  Date/Time Value Ref Range Status  05/05/2019 12:02 PM 6.3 (H) <5.7 % of total Hgb Final    Comment:    For someone without known diabetes, a hemoglobin  A1c value between 5.7% and 6.4% is consistent with prediabetes and should be confirmed with a  follow-up test. . For someone with known diabetes, a value <7% indicates that their diabetes is well controlled. A1c targets should be individualized based on duration of diabetes, age, comorbid conditions, and other considerations. . This assay result is consistent with an increased risk of diabetes. . Currently, no consensus exists regarding use of hemoglobin A1c for diagnosis of diabetes for children. [name]   10/28/2018 12:00 AM 6.2 (H) <5.7 % of total Hgb Final    Comment:    For someone without known diabetes, a hemoglobin  A1c value between 5.7% and 6.4% is consistent with prediabetes and should be confirmed with a  follow-up test. . For someone with known diabetes, a value <7% indicates that their diabetes is well controlled. A1c targets should be individualized based on duration of diabetes, age, comorbid conditions, and other considerations. . This assay result is consistent with an increased risk of diabetes. . Currently, no consensus exists regarding use of hemoglobin A1c for diagnosis of diabetes for children. .     CBG: Recent Labs  Lab 10/18/19 0323  GLUCAP 224*     Critical care  time: 35 minutes    Roselie Awkward, MD [place] Pager: [phone] Cell: [phone] If no response, call [phone]

## 2019-10-18 NOTE — Progress Notes (Signed)
Patient pulled out her OGT. New OGT tube inserted. Waiting for KUB results for placement verification.

## 2019-10-18 NOTE — Progress Notes (Signed)
Initial Nutrition Assessment  RD working remotely.  DOCUMENTATION CODES:   Obesity unspecified  INTERVENTION:  If patient does not extubate recommend initiating Vital High Protein at 60 mL/hr (1440 mL goal daily volume) per tube + PROSource TF 90 mL once daily per tube. Goal regimen provides 1520 kcal, 148 grams of protein, 1209 mL H2O daily.  Provide MVI daily per tube.  NUTRITION DIAGNOSIS:   Inadequate oral intake related to inability to eat as evidenced by NPO status.  GOAL:   Patient will meet greater than or equal to 90% of their needs  MONITOR:   Vent status, Labs, Weight trends, TF tolerance, I & O's  REASON FOR ASSESSMENT:   Consult Assessment of nutrition requirement/status, Enteral/tube feeding initiation and management  ASSESSMENT:   69 year old female with PMHx of HTN, asthma, HLD, anxiety, arthritis, depression, GERD, migraines, chronic nausea, fibromyalgia, IBS, hypothyroidism, osteoporosis, iron deficiency anemia who was diagnosed with COVID-19 on 8/19 and now admitted with respiratory failure.   8/20 intubated  Patient is currently intubated on ventilator support MV: 5.1 L/min Temp (24hrs), Avg:98 F (36.7 C), Min:95.9 F (35.5 C), Max:101.4 F (38.6 C)  Medications reviewed and include: Colace 100 mg BID per tube, levothyroxine, Solu-Medrol 0.5 mg/kg Q12hrs IV, Protonix, Versed gtt, norepinephrine gtt at 3 mcg/min, remdesivir.  Labs reviewed: CBG 185-224, Sodium 132, CO2 17, BUN 30, Creatinine 1.4.  I/O: 1120 mL UOP yesterday  Enteral Access: OGT; terminates in proximal stomach per abdominal x-ray 8/20 with possible kink in distal portion of tube  Per secure chat with MD plan is to start tube feeds today. Per RN unable to use OGT at this time. Patient may also extubate today so now plan is to hold off on tube feeds at this time.  NUTRITION - FOCUSED PHYSICAL EXAM:  Unable to complete at this time as RD is working remotely.  Diet Order:   Diet  Order            Diet NPO time specified  Diet effective now                EDUCATION NEEDS:   No education needs have been identified at this time  Skin:  Skin Assessment: Reviewed RN Assessment  Last BM:  Unknown  Height:   Ht Readings from Last 1 Encounters:  10/14/19 5\' 4"  (1.626 m)   Weight:   Wt Readings from Last 1 Encounters:  10/18/19 94.3 kg   Ideal Body Weight:  54.5 kg  BMI:  Body mass index is 35.68 kg/m.  Estimated Nutritional Needs:   Kcal:  1400-1600  Protein:  140-150 grams  Fluid:  >/= 2 L/day  Jacklynn Barnacle, MS, RD, LDN Pager number available on Amion

## 2019-10-19 ENCOUNTER — Inpatient Hospital Stay: Payer: Self-pay

## 2019-10-19 LAB — CBC WITH DIFFERENTIAL/PLATELET
Abs Immature Granulocytes: 0.15 10*3/uL — ABNORMAL HIGH (ref 0.00–0.07)
Basophils Absolute: 0 10*3/uL (ref 0.0–0.1)
Basophils Relative: 0 %
Eosinophils Absolute: 0 10*3/uL (ref 0.0–0.5)
Eosinophils Relative: 0 %
HCT: 36.9 % (ref 36.0–46.0)
Hemoglobin: 12.5 g/dL (ref 12.0–15.0)
Immature Granulocytes: 2 %
Lymphocytes Relative: 6 %
Lymphs Abs: 0.5 10*3/uL — ABNORMAL LOW (ref 0.7–4.0)
MCH: 30.8 pg (ref 26.0–34.0)
MCHC: 33.9 g/dL (ref 30.0–36.0)
MCV: 90.9 fL (ref 80.0–100.0)
Monocytes Absolute: 0.6 10*3/uL (ref 0.1–1.0)
Monocytes Relative: 6 %
Neutro Abs: 7.7 10*3/uL (ref 1.7–7.7)
Neutrophils Relative %: 86 %
Platelets: 237 10*3/uL (ref 150–400)
RBC: 4.06 MIL/uL (ref 3.87–5.11)
RDW: 13.7 % (ref 11.5–15.5)
WBC: 8.9 10*3/uL (ref 4.0–10.5)
nRBC: 0 % (ref 0.0–0.2)

## 2019-10-19 LAB — COMPREHENSIVE METABOLIC PANEL
ALT: 21 U/L (ref 0–44)
AST: 42 U/L — ABNORMAL HIGH (ref 15–41)
Albumin: 1.9 g/dL — ABNORMAL LOW (ref 3.5–5.0)
Alkaline Phosphatase: 45 U/L (ref 38–126)
Anion gap: 11 (ref 5–15)
BUN: 37 mg/dL — ABNORMAL HIGH (ref 8–23)
CO2: 20 mmol/L — ABNORMAL LOW (ref 22–32)
Calcium: 8.5 mg/dL — ABNORMAL LOW (ref 8.9–10.3)
Chloride: 107 mmol/L (ref 98–111)
Creatinine, Ser: 1.12 mg/dL — ABNORMAL HIGH (ref 0.44–1.00)
GFR calc Af Amer: 58 mL/min — ABNORMAL LOW (ref >60)
GFR calc non Af Amer: 50 mL/min — ABNORMAL LOW (ref >60)
Glucose, Bld: 243 mg/dL — ABNORMAL HIGH (ref 70–99)
Potassium: 3.8 mmol/L (ref 3.5–5.1)
Sodium: 138 mmol/L (ref 135–145)
Total Bilirubin: 0.4 mg/dL (ref 0.3–1.2)
Total Protein: 5.8 g/dL — ABNORMAL LOW (ref 6.5–8.1)

## 2019-10-19 LAB — BASIC METABOLIC PANEL
Anion gap: 11 (ref 5–15)
BUN: 42 mg/dL — ABNORMAL HIGH (ref 8–23)
CO2: 24 mmol/L (ref 22–32)
Calcium: 7.9 mg/dL — ABNORMAL LOW (ref 8.9–10.3)
Chloride: 105 mmol/L (ref 98–111)
Creatinine, Ser: 1.15 mg/dL — ABNORMAL HIGH (ref 0.44–1.00)
GFR calc Af Amer: 56 mL/min — ABNORMAL LOW (ref >60)
GFR calc non Af Amer: 49 mL/min — ABNORMAL LOW (ref >60)
Glucose, Bld: 235 mg/dL — ABNORMAL HIGH (ref 70–99)
Potassium: 3.3 mmol/L — ABNORMAL LOW (ref 3.5–5.1)
Sodium: 140 mmol/L (ref 135–145)

## 2019-10-19 LAB — PHOSPHORUS
Phosphorus: 2.6 mg/dL (ref 2.5–4.6)
Phosphorus: 2.9 mg/dL (ref 2.5–4.6)

## 2019-10-19 LAB — GLUCOSE, CAPILLARY
Glucose-Capillary: 207 mg/dL — ABNORMAL HIGH (ref 70–99)
Glucose-Capillary: 226 mg/dL — ABNORMAL HIGH (ref 70–99)
Glucose-Capillary: 208 mg/dL — ABNORMAL HIGH (ref 70–99)
Glucose-Capillary: 189 mg/dL — ABNORMAL HIGH (ref 70–99)
Glucose-Capillary: 226 mg/dL — ABNORMAL HIGH (ref 70–99)
Glucose-Capillary: 220 mg/dL — ABNORMAL HIGH (ref 70–99)

## 2019-10-19 LAB — MAGNESIUM
Magnesium: 2.2 mg/dL (ref 1.7–2.4)
Magnesium: 1.8 mg/dL (ref 1.7–2.4)

## 2019-10-19 LAB — URINE CULTURE: Culture: NO GROWTH

## 2019-10-19 MED ORDER — SODIUM CHLORIDE 0.9% FLUSH: 10.0000 mL | INTRAVENOUS | Status: DC | PRN

## 2019-10-19 MED ORDER — MIDAZOLAM HCL 2 MG/2ML IJ SOLN
1.0000 mg | INTRAMUSCULAR | Status: DC | PRN
  Administered 2019-10-20 – 2019-10-26 (×4): 2 mg via INTRAVENOUS
  Filled 2019-10-19 (×5): qty 2

## 2019-10-19 MED ORDER — CHLORHEXIDINE GLUCONATE 0.12% ORAL RINSE (MEDLINE KIT)
15.0000 mL | Freq: Two times a day (BID) | OROMUCOSAL | Status: DC
  Administered 2019-10-19 – 2019-11-12 (×49): 15 mL via OROMUCOSAL

## 2019-10-19 MED ORDER — FUROSEMIDE 10 MG/ML IJ SOLN
40.0000 mg | Freq: Four times a day (QID) | INTRAMUSCULAR | Status: AC
  Administered 2019-10-19 (×2): 40 mg via INTRAVENOUS
  Filled 2019-10-19 (×2): qty 4

## 2019-10-19 MED ORDER — SODIUM CHLORIDE 0.9% FLUSH
10.0000 mL | Freq: Two times a day (BID) | INTRAVENOUS | Status: DC
  Administered 2019-10-19 – 2019-10-25 (×12): 10 mL
  Administered 2019-10-26: 30 mL
  Administered 2019-10-26: 10 mL
  Administered 2019-10-27: 30 mL
  Administered 2019-10-28 – 2019-10-30 (×5): 10 mL
  Administered 2019-10-31: 20 mL
  Administered 2019-10-31 – 2019-11-12 (×21): 10 mL

## 2019-10-19 MED ORDER — FENTANYL 2500MCG IN NS 250ML (10MCG/ML) PREMIX INFUSION
0.0000 ug/h | INTRAVENOUS | Status: DC
  Administered 2019-10-19: 100 ug/h via INTRAVENOUS
  Administered 2019-10-19: 200 ug/h via INTRAVENOUS
  Administered 2019-10-20 (×2): 250 ug/h via INTRAVENOUS
  Administered 2019-10-21: 400 ug/h via INTRAVENOUS
  Administered 2019-10-21: 300 ug/h via INTRAVENOUS
  Administered 2019-10-21: 250 ug/h via INTRAVENOUS
  Administered 2019-10-22: 200 ug/h via INTRAVENOUS
  Administered 2019-10-22: 350 ug/h via INTRAVENOUS
  Administered 2019-10-23: 150 ug/h via INTRAVENOUS
  Administered 2019-10-23: 225 ug/h via INTRAVENOUS
  Administered 2019-10-24: 175 ug/h via INTRAVENOUS
  Administered 2019-10-25 – 2019-10-27 (×4): 100 ug/h via INTRAVENOUS
  Administered 2019-10-29: 25 ug/h via INTRAVENOUS
  Administered 2019-10-30: 150 ug/h via INTRAVENOUS
  Filled 2019-10-19 (×18): qty 250

## 2019-10-19 MED ORDER — ORAL CARE MOUTH RINSE
15.0000 mL | OROMUCOSAL | Status: DC
  Administered 2019-10-19 – 2019-11-12 (×237): 15 mL via OROMUCOSAL

## 2019-10-19 MED ORDER — POTASSIUM CHLORIDE 20 MEQ/15ML (10%) PO SOLN
20.0000 meq | ORAL | Status: AC
  Administered 2019-10-19 – 2019-10-20 (×2): 20 meq
  Filled 2019-10-19 (×2): qty 15

## 2019-10-19 MED ORDER — INSULIN ASPART 100 UNIT/ML ~~LOC~~ SOLN
5.0000 [IU] | SUBCUTANEOUS | Status: DC
  Administered 2019-10-19 – 2019-10-20 (×5): 5 [IU] via SUBCUTANEOUS

## 2019-10-19 MED ORDER — INSULIN ASPART 100 UNIT/ML ~~LOC~~ SOLN
5.0000 [IU] | SUBCUTANEOUS | Status: DC
  Administered 2019-10-19 (×2): 5 [IU] via SUBCUTANEOUS

## 2019-10-19 MED ORDER — FREE WATER
200.0000 mL | Status: DC
  Administered 2019-10-19 – 2019-10-20 (×5): 200 mL
  Administered 2019-10-20: 100 mL
  Administered 2019-10-20: 200 mL

## 2019-10-19 MED ORDER — MAGNESIUM SULFATE 2 GM/50ML IV SOLN
2.0000 g | Freq: Once | INTRAVENOUS | Status: AC
  Administered 2019-10-19: 2 g via INTRAVENOUS
  Filled 2019-10-19: qty 50

## 2019-10-19 NOTE — Progress Notes (Signed)
[facility] Progress Note Patient Name: Jacqueline Lucas DOB: 1950/06/08 MRN: [id number]   Date of Service  10/19/2019  HPI/Events of Note  Low Mag. K was 3.8 in AM. On lasix diuresis. covid.  eICU Interventions  BMP, mag 1 gm IV once.      Intervention Category Intermediate Interventions: Electrolyte abnormality - evaluation and management  Elmer Sow 10/19/2019, 8:30 PM

## 2019-10-19 NOTE — Progress Notes (Signed)
PT Cancellation Note  Patient Details Name: Jacqueline Lucas MRN: [id number] DOB: 1950-11-17   Cancelled Treatment:    Reason Eval/Treat Not Completed: Medical issues which prohibited therapy.  Pt is presently sedated and vented.  Will see when pt is more appropriate for therapies. 10/19/2019  [name], PT Acute Rehabilitation Services [phone]  (pager) [phone]  (office)   [name] [name] 10/19/2019, 4:11 PM

## 2019-10-19 NOTE — Progress Notes (Signed)
SLP Cancellation Note  Patient Details Name: Jacqueline Lucas MRN: [id number] DOB: 1950-07-15   Cancelled treatment:       Reason Eval/Treat Not Completed: Medical issues which prohibited therapy (Pt is still intubated; SLP will follow up. )  Lorraine Terriquez I. Hardin Negus, [place], [place] Office number [phone] Pager [place] 10/19/2019, 8:15 AM

## 2019-10-19 NOTE — Progress Notes (Signed)
PICC order received, spoke with Lewie Loron.

## 2019-10-19 NOTE — Progress Notes (Signed)
[facility] PCCM   I called her son Kasandra Knudsen for an update  Roselie Awkward, MD [place] P[facility]M Pager: [phone] Cell: [phone] If no response, call [phone]

## 2019-10-19 NOTE — Progress Notes (Signed)
NAME:  Jacqueline Lucas, MRN:  [id number], DOB:  22-Aug-1950, LOS: 2 ADMISSION DATE:  10/21/2019, CONSULTATION DATE:  8/20 REFERRING MD:  EDP, CHIEF COMPLAINT:  Dyspnea   Brief History   69 y/o female admitted on 8/19 with severe acute respiratory failure with hypoxemia due to COVID 19 pneumonia.  Past Medical History  Asthma Obesity Hypertension Hyperlipidemia Degenerative disk disease Depression Fibromyalgia Heart murmur GERD Hypothyroidism IDA Insomnia Fe def anemia  Significant Hospital Events   8/20 Admit to PCCM 8/21 failed SBT, oxygenation worsened thorought the day  Consults:    Procedures:  8/20 ETT > 8/21  Significant Diagnostic Tests:    Micro Data:  8/20 SARS COV 2>>positive 8/20 UC>> 8/20 BCx2>>  Antimicrobials/COVID  8/20 Remdesivir >  8/20 baricitinib >  8/20 solumedrol >  Interim history/subjective:   Worsening oxygenation  Objective   Blood pressure (!) 114/57, pulse (!) 56, temperature 98.8 F (37.1 C), resp. rate (!) 32, weight 92.7 kg, SpO2 98 %.    Vent Mode: PRVC FiO2 (%):  [50 %-100 %] 100 % Set Rate:  [28 bmp] 28 bmp Vt Set:  [320 mL] 320 mL PEEP:  [5 cmH20-8 cmH20] 8 cmH20 Pressure Support:  [5 cmH20] 5 cmH20 Plateau Pressure:  [18 JIR67-89 cmH20] 21 cmH20   Intake/Output Summary (Last 24 hours) at 10/19/2019 3810 Last data filed at 10/19/2019 0600 Gross per 24 hour  Intake 1992.88 ml  Output 895 ml  Net 1097.88 ml   Filed Weights   10/18/19 0500 10/19/19 0200  Weight: 94.3 kg 92.7 kg    Examination:  General:  In bed on vent HENT: NCAT ETT in place PULM: CTA B, vent supported breathing CV: RRR, no mgr GI: BS+, soft, nontender MSK: normal bulk and tone Neuro: sedated on vent   8/21 CXR images personally reviewed showing severe bilateral airspace disease  Resolved Hospital Problem list     Assessment & Plan:  ARDS cue to COVID 19 pneumonia> initially improved on 8/21, but throughout day condition  deteriorated consistent with COVID ARDS Change settings to mechanical ventilation per ARDS protocol Target TVol 6-8cc/kgIBW Target Plateau Pressure < 30cm H20 Target driving pressure less than 15 cm of water Target PaO2 55-65: titrate PEEP/FiO2 per protocol As long as PaO2 to FiO2 ratio is less than 1:150 position in prone position for 16 hours a day Check CVP daily if CVL in place Target CVP less than 4, diurese as necessary Ventilator associated pneumonia prevention protocol 8/22 > start ARDS protocol, supine abg, may need prone, lasix; remdesivir 5 days, solumedrol 1mg /kg 3-5 days then prednisone, baricitinib 14 days or hospital discharge, monitor LFT on baricitinib  Hypothyroidism synthroid  AKI> improved Monitor BMET and UOP Replace electrolytes as needed Lasix today  Depression effexor divalproate  Hypertension Monitor  GERD Pantoprazole for stress ulcer prophylaxis   Hyperglycemia Check Hgb A1c tomorrow SSI Add tube feeding coverage  Need for sedation for mechanical ventilation RASS goal -2 Add fentanyl infusion Continue propofol D/c precedex PAD protocol   Best practice:  Diet:tube feeding  Pain/Anxiety/Delirium protocol (if indicated): as above VAP protocol (if indicated): yes DVT prophylaxis: lovenox GI prophylaxis: Pantoprazole for stress ulcer prophylaxis Glucose control: SSI Mobility: out of bed after extubation Code Status: full Family Communication: I called her son, left a message on 8/21 Disposition: reman in ICU  Labs   CBC: Recent Labs  Lab 10/14/19 1917 10/14/19 1917 10/03/2019 1956 10/04/2019 2044 10/18/19 0029 10/18/19 1541 10/19/19 0500  WBC 4.1  --   --  9.3  --   --  8.9  NEUTROABS  --   --   --  7.5  --   --  7.7  HGB 14.5   < > 10.5* 12.8 11.9* 12.9 12.5  HCT 40.3   < > 31.0* 36.5 35.0* 38.0 36.9  MCV 88.6  --   --  91.9  --   --  90.9  PLT 134*  --   --  217  --   --  237   < > = values in this interval not displayed.     Basic Metabolic Panel: Recent Labs  Lab 10/14/19 1917 10/26/2019 1956 10/20/2019 2044 10/18/19 0029 10/18/19 0101 10/18/19 1541 10/18/19 1553 10/19/19 0500  NA 135   < > 130* 130* 132* 139  --  138  K 3.6   < > 3.7 3.7 3.5 3.7  --  3.8  CL 102  --  95*  --  101  --   --  107  CO2 24  --  16*  --  17*  --   --  20*  GLUCOSE 145*  --  125*  --  225*  --   --  243*  BUN 8  --  33*  --  30*  --   --  37*  CREATININE 0.57  --  1.57*  --  1.40*  --   --  1.12*  CALCIUM 8.4*  --  7.4*  --  7.8*  --   --  8.5*  MG  --   --   --   --   --   --  2.2 2.2  PHOS  --   --   --   --   --   --  3.1 2.6   < > = values in this interval not displayed.   GFR: Estimated Creatinine Clearance: 52.3 mL/min (A) (by C-G formula based on SCr of 1.12 mg/dL (H)). Recent Labs  Lab 10/14/19 1917 09/30/2019 2043 10/14/2019 2044 10/04/2019 2100 10/09/2019 2220 10/19/19 0500  PROCALCITON  --   --   --  <0.10  --   --   WBC 4.1  --  9.3  --   --  8.9  LATICACIDVEN  --  1.5  --   --  0.9  --     Liver Function Tests: Recent Labs  Lab 10/21/2019 2044 10/19/19 0500  AST 61* 42*  ALT 21 21  ALKPHOS 47 45  BILITOT 1.6* 0.4  PROT 6.3* 5.8*  ALBUMIN 2.7* 1.9*   No results for input(s): LIPASE, AMYLASE in the last 168 hours. No results for input(s): AMMONIA in the last 168 hours.  ABG    Component Value Date/Time   PHART 7.402 10/18/2019 1541   PCO2ART 34.8 10/18/2019 1541   PO2ART 52 (L) 10/18/2019 1541   HCO3 21.9 10/18/2019 1541   TCO2 23 10/18/2019 1541   ACIDBASEDEF 3.0 (H) 10/18/2019 1541   O2SAT 89.0 10/18/2019 1541     Coagulation Profile: Recent Labs  Lab 10/16/2019 2221  INR 1.2    Cardiac Enzymes: No results for input(s): CKTOTAL, CKMB, CKMBINDEX, TROPONINI in the last 168 hours.  HbA1C: Hgb A1c MFr Bld  Date/Time Value Ref Range Status  10/18/2019 03:53 PM 6.5 (H) 4.8 - 5.6 % Final    Comment:    (NOTE) Pre diabetes:          5.7%-6.4%  Diabetes:               >  6.4%  Glycemic control for   <7.0% adults with diabetes   05/05/2019 12:02 PM 6.3 (H) <5.7 % of total Hgb Final    Comment:    For someone without known diabetes, a hemoglobin  A1c value between 5.7% and 6.4% is consistent with prediabetes and should be confirmed with a  follow-up test. . For someone with known diabetes, a value <7% indicates that their diabetes is well controlled. A1c targets should be individualized based on duration of diabetes, age, comorbid conditions, and other considerations. . This assay result is consistent with an increased risk of diabetes. . Currently, no consensus exists regarding use of hemoglobin A1c for diagnosis of diabetes for children. .     CBG: Recent Labs  Lab 10/18/19 1117 10/18/19 1639 10/18/19 1919 10/18/19 2334 10/19/19 0317  GLUCAP 186* 205* 181* 205* 207*     Critical care time: 35 minutes    Roselie Awkward, MD [place] [facility] Pager: [phone] Cell: [phone] If no response, call [phone]

## 2019-10-19 NOTE — Progress Notes (Signed)
Telephone consent obtained from son.

## 2019-10-19 NOTE — Progress Notes (Signed)
Peripherally Inserted Central Catheter Placement  The IV Nurse has discussed with the patient and/or persons authorized to consent for the patient, the purpose of this procedure and the potential benefits and risks involved with this procedure.  The benefits include less needle sticks, lab draws from the catheter, and the patient may be discharged home with the catheter. Risks include, but not limited to, infection, bleeding, blood clot (thrombus formation), and puncture of an artery; nerve damage and irregular heartbeat and possibility to perform a PICC exchange if needed/ordered by physician.  Alternatives to this procedure were also discussed.  Bard Power PICC patient education guide, fact sheet on infection prevention and patient information card has been provided to patient /or left at bedside.  Telephone consent obtained from son due to altered mental status.  PICC Placement Documentation  PICC Triple Lumen 36/14/43 PICC Right Basilic 40 cm 0 cm (Active)  Indication for Insertion or Continuance of Line Prolonged intravenous therapies;Vasoactive infusions 10/19/19 1732  Exposed Catheter (cm) 0 cm 10/19/19 1732  Site Assessment Clean;Dry;Intact 10/19/19 1732  Lumen #1 Status Flushed;Saline locked;Blood return noted 10/19/19 1732  Lumen #2 Status Flushed;Saline locked;Blood return noted 10/19/19 1732  Lumen #3 Status Flushed;Saline locked;Blood return noted 10/19/19 1732  Dressing Type Transparent 10/19/19 1732  Dressing Status Clean;Dry;Intact 10/19/19 1732  Dressing Intervention New dressing 10/19/19 1540  Dressing Change Due 10/26/19 10/19/19 1732       Alexsander Cavins, Nicolette Bang 10/19/2019, 5:33 PM

## 2019-10-20 ENCOUNTER — Inpatient Hospital Stay (HOSPITAL_COMMUNITY): Payer: Medicare Other

## 2019-10-20 ENCOUNTER — Other Ambulatory Visit: Payer: Medicare Other

## 2019-10-20 LAB — GLUCOSE, CAPILLARY
Glucose-Capillary: 145 mg/dL — ABNORMAL HIGH (ref 70–99)
Glucose-Capillary: 155 mg/dL — ABNORMAL HIGH (ref 70–99)
Glucose-Capillary: 157 mg/dL — ABNORMAL HIGH (ref 70–99)
Glucose-Capillary: 164 mg/dL — ABNORMAL HIGH (ref 70–99)
Glucose-Capillary: 202 mg/dL — ABNORMAL HIGH (ref 70–99)
Glucose-Capillary: 219 mg/dL — ABNORMAL HIGH (ref 70–99)
Glucose-Capillary: 237 mg/dL — ABNORMAL HIGH (ref 70–99)

## 2019-10-20 LAB — CBC WITH DIFFERENTIAL/PLATELET
Abs Immature Granulocytes: 0.52 10*3/uL — ABNORMAL HIGH (ref 0.00–0.07)
Basophils Absolute: 0 10*3/uL (ref 0.0–0.1)
Basophils Relative: 0 %
Eosinophils Absolute: 0 10*3/uL (ref 0.0–0.5)
Eosinophils Relative: 0 %
HCT: 35 % — ABNORMAL LOW (ref 36.0–46.0)
Hemoglobin: 11.7 g/dL — ABNORMAL LOW (ref 12.0–15.0)
Immature Granulocytes: 5 %
Lymphocytes Relative: 5 %
Lymphs Abs: 0.5 10*3/uL — ABNORMAL LOW (ref 0.7–4.0)
MCH: 31.2 pg (ref 26.0–34.0)
MCHC: 33.4 g/dL (ref 30.0–36.0)
MCV: 93.3 fL (ref 80.0–100.0)
Monocytes Absolute: 0.5 10*3/uL (ref 0.1–1.0)
Monocytes Relative: 5 %
Neutro Abs: 8.5 10*3/uL — ABNORMAL HIGH (ref 1.7–7.7)
Neutrophils Relative %: 85 %
Platelets: 368 10*3/uL (ref 150–400)
RBC: 3.75 MIL/uL — ABNORMAL LOW (ref 3.87–5.11)
RDW: 14.3 % (ref 11.5–15.5)
WBC: 10.1 10*3/uL (ref 4.0–10.5)
nRBC: 0.3 % — ABNORMAL HIGH (ref 0.0–0.2)

## 2019-10-20 LAB — POCT I-STAT 7, (LYTES, BLD GAS, ICA,H+H)
Acid-Base Excess: 0 mmol/L (ref 0.0–2.0)
Bicarbonate: 25.1 mmol/L (ref 20.0–28.0)
Calcium, Ion: 1.15 mmol/L (ref 1.15–1.40)
HCT: 34 % — ABNORMAL LOW (ref 36.0–46.0)
Hemoglobin: 11.6 g/dL — ABNORMAL LOW (ref 12.0–15.0)
O2 Saturation: 97 %
Patient temperature: 99.7
Potassium: 3.9 mmol/L (ref 3.5–5.1)
Sodium: 141 mmol/L (ref 135–145)
TCO2: 26 mmol/L (ref 22–32)
pCO2 arterial: 43.5 mmHg (ref 32.0–48.0)
pH, Arterial: 7.372 (ref 7.350–7.450)
pO2, Arterial: 93 mmHg (ref 83.0–108.0)

## 2019-10-20 LAB — COMPREHENSIVE METABOLIC PANEL
ALT: 19 U/L (ref 0–44)
AST: 26 U/L (ref 15–41)
Albumin: 1.8 g/dL — ABNORMAL LOW (ref 3.5–5.0)
Alkaline Phosphatase: 38 U/L (ref 38–126)
Anion gap: 8 (ref 5–15)
BUN: 37 mg/dL — ABNORMAL HIGH (ref 8–23)
CO2: 24 mmol/L (ref 22–32)
Calcium: 7.5 mg/dL — ABNORMAL LOW (ref 8.9–10.3)
Chloride: 106 mmol/L (ref 98–111)
Creatinine, Ser: 0.85 mg/dL (ref 0.44–1.00)
GFR calc Af Amer: >60 mL/min (ref >60)
GFR calc non Af Amer: >60 mL/min (ref >60)
Glucose, Bld: 216 mg/dL — ABNORMAL HIGH (ref 70–99)
Potassium: 4.2 mmol/L (ref 3.5–5.1)
Sodium: 138 mmol/L (ref 135–145)
Total Bilirubin: 0.6 mg/dL (ref 0.3–1.2)
Total Protein: 5.5 g/dL — ABNORMAL LOW (ref 6.5–8.1)

## 2019-10-20 LAB — HEMOGLOBIN A1C
Hgb A1c MFr Bld: 6.6 % — ABNORMAL HIGH (ref 4.8–5.6)
Mean Plasma Glucose: 142.72 mg/dL

## 2019-10-20 LAB — MAGNESIUM: Magnesium: 2.3 mg/dL (ref 1.7–2.4)

## 2019-10-20 LAB — PHOSPHORUS: Phosphorus: 2.3 mg/dL — ABNORMAL LOW (ref 2.5–4.6)

## 2019-10-20 MED ORDER — FREE WATER
100.0000 mL | Status: DC
  Administered 2019-10-20 – 2019-10-21 (×6): 100 mL

## 2019-10-20 MED ORDER — INSULIN ASPART 100 UNIT/ML ~~LOC~~ SOLN
7.0000 [IU] | SUBCUTANEOUS | Status: DC
  Administered 2019-10-20 – 2019-10-23 (×18): 7 [IU] via SUBCUTANEOUS

## 2019-10-20 MED ORDER — POTASSIUM & SODIUM PHOSPHATES 280-160-250 MG PO PACK
1.0000 | PACK | Freq: Three times a day (TID) | ORAL | Status: AC
  Administered 2019-10-20 (×3): 1
  Filled 2019-10-20 (×3): qty 1

## 2019-10-20 NOTE — Progress Notes (Addendum)
PT Cancellation Note  Patient Details Name: Jacqueline Lucas MRN: [id number] DOB: [birth year]   Cancelled Treatment:    Reason Eval/Treat Not Completed: Medical issues which prohibited therapy.  Sedated and vented.  Will check back 1 more day and sign off if not appropriate to mobilize.10/20/2019  [name], PT Acute Rehabilitation Services [phone]  (pager) [phone]  (office) 10/20/2019  [name], PT [facility] [phone]  (pager) [phone]  (office)   [name] Lyrik Dockstader 10/20/2019, 10:38 AM

## 2019-10-20 NOTE — Progress Notes (Signed)
SLP Cancellation Note  Patient Details Name: Jacqueline Lucas MRN: [id number] DOB: [birth year]   Cancelled treatment:       Reason Eval/Treat Not Completed: Medical issues which prohibited therapy (pt remains on vent this morning). Will continue to follow.    Osie Bond., M.A. [facility] Pager [phone] Office [phone]  10/20/2019, 7:17 AM

## 2019-10-20 NOTE — Progress Notes (Addendum)
NAME:  Jacqueline Lucas, MRN:  [id number], DOB:  [birth year], LOS: 3 ADMISSION DATE:  10/03/2019, CONSULTATION DATE:  8/20 REFERRING MD:  EDP, CHIEF COMPLAINT:  Dyspnea   Brief History   69 y/o female admitted on 8/19 with severe acute respiratory failure with hypoxemia due to COVID 19 pneumonia.  Past Medical History  Asthma Obesity Hypertension Hyperlipidemia Degenerative disk disease Depression Fibromyalgia Heart murmur GERD Hypothyroidism IDA Insomnia Fe def anemia  Significant Hospital Events   8/20 Admit to PCCM 8/21 failed SBT, oxygenation worsened thorought the day  Consults:    Procedures:  8/20 ETT > 8/21  Significant Diagnostic Tests:    Micro Data:  8/20 SARS COV 2>>positive 8/20 UC>> no growth 8/20 BCx2>> no growth to date  Antimicrobials/COVID  8/20 Remdesivir >  8/20 baricitinib >  8/20 solumedrol >  Interim history/subjective:  Overnight: Heart rate was in the 120s, EKG shows sinus tachycardia.  O2 sat was 95 on 60% and 12 of PEEP.  Patient is seen at bedside.  She is sedated.  Minimally responsive.  Continue to wean down PEEP and FiO2.  Objective   Blood pressure 129/60, pulse (!) 115, temperature 100 F (37.8 C), resp. rate (!) 29, weight 96.3 kg, SpO2 91 %. CVP:  [3 mmHg] 3 mmHg  Vent Mode: PRVC FiO2 (%):  [50 %-60 %] 50 % Set Rate:  [28 bmp] 28 bmp Vt Set:  [320 mL] 320 mL PEEP:  [8 cmH20-14 cmH20] 8 cmH20 Plateau Pressure:  [21 cmH20-22 cmH20] 21 cmH20   Intake/Output Summary (Last 24 hours) at 10/20/2019 1157 Last data filed at 10/20/2019 1116 Gross per 24 hour  Intake 3059.89 ml  Output 4040 ml  Net -980.11 ml   Filed Weights   10/18/19 0500 10/19/19 0200 10/20/19 0401  Weight: 94.3 kg 92.7 kg 96.3 kg    Examination:  Physical Exam Constitutional:      General: She is not in acute distress.    Appearance: She is not toxic-appearing.  HENT:     Head: Normocephalic.  Cardiovascular:     Rate and Rhythm: Regular  rhythm. Tachycardia present.     Heart sounds: Normal heart sounds. No murmur heard.   Pulmonary:     Comments: Diminished breath sounds bilaterally Abdominal:     General: Bowel sounds are normal.     Palpations: Abdomen is soft.  Musculoskeletal:     Right lower leg: No edema.     Left lower leg: No edema.  Skin:    General: Skin is warm.     Coloration: Skin is not jaundiced.  Neurological:     Comments: Sedated and minimally responsive     Resolved Hospital Problem list     Assessment & Plan:  Acute respiratory failure requiring mechanical ventilation due to COVID-19 AKI-resolved Hypertension Diabetes with hyperglycemia, A1c 6.6 Hypertension Hypothyroidism Depression  Plan:  Acute respiratory failure requiring mechanical ventilation COVID-19 pneumonia Underlying asthma Patient continues to do well.  ABG looks good.  Continue to wean ventilator as tolerated.  Chest x-ray today looks better compared to 8/21. PRVC/rate 28/TV 320/PEEP 8/FI 50 -Change settings to mechanical ventilation per ARDS protocol -Target TVol 6-8cc/kgIBW -Target Plateau Pressure < 30cm H20 -Target driving pressure less than 15 cm of water -Target PaO2 55-65: titrate PEEP/FiO2 per protocol -As long as PaO2 to FiO2 ratio is less than 1:150 position in prone position for 16 hours a day -VAP bundle.   -Sedation with fentanyl and propofol.  Wean as tolerated -  Remdesivir, Solu-Medrol and baricitinib per protocol   AKI-resolved -BMP daily -Decrease free water to 100 cc every 4 hours   Hypothyroidism TSH of 0.9.  Continue synthroid 50 daily   Depression Continue home effexor and divalproate   Hypertension Currently normotensive -Hold home med    Diabetes with hyperglycemia A1c of 6.6.  CBG still fluctuates in the 200s.  Continue tight blood glucose control in the setting of steroid use. -Continue CBG -SSI -Increase NovoLog to 7 units every 4 hours   Best practice:  Diet:tube  feeding  Pain/Anxiety/Delirium protocol (if indicated): as above VAP protocol (if indicated): yes DVT prophylaxis: lovenox GI prophylaxis: Pantoprazole for stress ulcer prophylaxis Glucose control: SSI Mobility: Bedrest Code Status: full Family Communication: We will update family Disposition: remain in ICU for ventilator management  Critical care time: 30 minutes    Jacqueline Gerold, DO Internal Medicine Residency My pager: [phone]   PCCM:   69 yo FM, intubated on mv, covid 19 ARDS   BP (!) 113/54   Pulse 78   Temp (!) 100.9 F (38.3 C)   Resp (!) 31   Wt 96.3 kg   SpO2 92%   BMI 36.44 kg/m   Gen: elderly fm, intubated on MV  Heart: RRR , s1 s2 Lungs: BL vented breaths  Abd: soft nt nd  Labs reviewed CXR: improved infiltrates. The patient's images have been independently reviewed by me.    A:  Covid 19 PNA  ARDS  AHRF on MV  AKI   P: Weaning vent No plans for proning today  PF looks ok  Sedation with fent/ propofol  Remdesivir, solumedrol, barcitinib   This patient is critically ill with multiple organ system failure; which, requires frequent high complexity decision making, assessment, support, evaluation, and titration of therapies. This was completed through the application of advanced monitoring technologies and extensive interpretation of multiple databases. During this encounter critical care time was devoted to patient care services described in this note for 33 minutes.  [facility] 10/20/2019 4:45 PM

## 2019-10-20 NOTE — Progress Notes (Signed)
Notified E-link patient's HR is in 130's. Obtained 12-lead ECG: Sinus Tachycardia with PAC's.

## 2019-10-20 NOTE — Progress Notes (Signed)
[facility] Progress Note Patient Name: [name] DOB: January 01, 1951 MRN: [id number]   Date of Service  10/20/2019  HPI/Events of Note  HR in 120s. RN did a 12 lead before we were called. Sinus tach noted. ON camera, o2 sat is 95 on 60%/peep 12 (unchanged), and she is comfortable on fentanyl and propofol. SBp is in 130s.   eICU Interventions  Wean levophed, HR in 120s and SBp in 120s currently on just 2 mic/min levophed - this was just lowered S/p fentanyl bolus with no change, and does not look uncomfortable so do not think sedation is an issue No fever, K and mag look good on last check No change in Fio2, RN notes no change in leg size/no isolated swelling noted either For now, will try and see if lowering levophed helps with HR otherwise no specific meds needed for this rate      Intervention Category Major Interventions: Arrhythmia - evaluation and management  Jacqueline Lucas 10/20/2019, 5:44 AM

## 2019-10-20 NOTE — Progress Notes (Signed)
OT Cancellation Note  Patient Details Name: Jacqueline Lucas MRN: [id number] DOB: [birth year]   Cancelled Treatment:    Reason Eval/Treat Not Completed: Medical issues which prohibited therapy; intubated and sedated. Will follow.  Lou Cal, OT Acute Rehabilitation Services Pager [phone] Office [phone]   Raymondo Band 10/20/2019, 1:16 PM

## 2019-10-21 ENCOUNTER — Inpatient Hospital Stay (HOSPITAL_COMMUNITY): Payer: Medicare Other

## 2019-10-21 ENCOUNTER — Telehealth: Payer: Self-pay

## 2019-10-21 DIAGNOSIS — J80 Acute respiratory distress syndrome: Secondary | ICD-10-CM

## 2019-10-21 LAB — BASIC METABOLIC PANEL
Anion gap: 9 (ref 5–15)
BUN: 24 mg/dL — ABNORMAL HIGH (ref 8–23)
CO2: 24 mmol/L (ref 22–32)
Calcium: 7.1 mg/dL — ABNORMAL LOW (ref 8.9–10.3)
Chloride: 103 mmol/L (ref 98–111)
Creatinine, Ser: 0.51 mg/dL (ref 0.44–1.00)
GFR calc Af Amer: >60 mL/min (ref >60)
GFR calc non Af Amer: >60 mL/min (ref >60)
Glucose, Bld: 185 mg/dL — ABNORMAL HIGH (ref 70–99)
Potassium: 3.9 mmol/L (ref 3.5–5.1)
Sodium: 136 mmol/L (ref 135–145)

## 2019-10-21 LAB — GLUCOSE, CAPILLARY
Glucose-Capillary: 190 mg/dL — ABNORMAL HIGH (ref 70–99)
Glucose-Capillary: 166 mg/dL — ABNORMAL HIGH (ref 70–99)
Glucose-Capillary: 124 mg/dL — ABNORMAL HIGH (ref 70–99)
Glucose-Capillary: 218 mg/dL — ABNORMAL HIGH (ref 70–99)
Glucose-Capillary: 181 mg/dL — ABNORMAL HIGH (ref 70–99)
Glucose-Capillary: 174 mg/dL — ABNORMAL HIGH (ref 70–99)

## 2019-10-21 LAB — TRIGLYCERIDES
Triglycerides: 1224 mg/dL — ABNORMAL HIGH (ref <150)
Triglycerides: 122 mg/dL (ref <150)

## 2019-10-21 LAB — CBC
HCT: 35.1 % — ABNORMAL LOW (ref 36.0–46.0)
Hemoglobin: 12 g/dL (ref 12.0–15.0)
MCH: 32.6 pg (ref 26.0–34.0)
MCHC: 34.2 g/dL (ref 30.0–36.0)
MCV: 95.4 fL (ref 80.0–100.0)
Platelets: 332 10*3/uL (ref 150–400)
RBC: 3.68 MIL/uL — ABNORMAL LOW (ref 3.87–5.11)
RDW: 14.4 % (ref 11.5–15.5)
WBC: 11.9 10*3/uL — ABNORMAL HIGH (ref 4.0–10.5)
nRBC: 0 % (ref 0.0–0.2)

## 2019-10-21 LAB — PROCALCITONIN: Procalcitonin: <0.1 ng/mL

## 2019-10-21 MED ORDER — ATORVASTATIN CALCIUM 40 MG PO TABS
40.0000 mg | ORAL_TABLET | Freq: Every day | ORAL | Status: DC
  Administered 2019-10-22 – 2019-11-12 (×22): 40 mg
  Filled 2019-10-21 (×22): qty 1

## 2019-10-21 MED ORDER — HYPROMELLOSE (GONIOSCOPIC) 2.5 % OP SOLN
1.0000 [drp] | Freq: Three times a day (TID) | OPHTHALMIC | Status: DC
  Administered 2019-10-21 – 2019-11-05 (×33): 1 [drp] via OPHTHALMIC
  Filled 2019-10-21: qty 15

## 2019-10-21 MED ORDER — LINACLOTIDE 145 MCG PO CAPS
145.0000 ug | ORAL_CAPSULE | Freq: Every day | ORAL | Status: DC
  Administered 2019-10-21 – 2019-11-12 (×22): 145 ug
  Filled 2019-10-21 (×23): qty 1

## 2019-10-21 MED ORDER — BARICITINIB 2 MG PO TABS
4.0000 mg | ORAL_TABLET | Freq: Every day | ORAL | Status: AC
  Administered 2019-10-22 – 2019-10-31 (×10): 4 mg
  Filled 2019-10-21 (×10): qty 2

## 2019-10-21 MED ORDER — POLYETHYLENE GLYCOL 3350 17 G PO PACK
17.0000 g | PACK | Freq: Every day | ORAL | Status: DC
  Administered 2019-10-22 – 2019-11-07 (×12): 17 g
  Filled 2019-10-21 (×12): qty 1

## 2019-10-21 MED ORDER — VALPROIC ACID 250 MG/5ML PO SOLN
250.0000 mg | Freq: Two times a day (BID) | ORAL | Status: DC
  Administered 2019-10-21 – 2019-11-12 (×44): 250 mg
  Filled 2019-10-21 (×44): qty 5

## 2019-10-21 MED ORDER — SODIUM CHLORIDE 0.9 % IV SOLN
2.0000 g | Freq: Three times a day (TID) | INTRAVENOUS | Status: AC
  Administered 2019-10-21 – 2019-10-28 (×21): 2 g via INTRAVENOUS
  Filled 2019-10-21 (×21): qty 2

## 2019-10-21 MED ORDER — FREE WATER
100.0000 mL | Freq: Three times a day (TID) | Status: DC
  Administered 2019-10-21 – 2019-10-25 (×12): 100 mL

## 2019-10-21 MED ORDER — VENLAFAXINE HCL 37.5 MG PO TABS
37.5000 mg | ORAL_TABLET | Freq: Every morning | ORAL | Status: DC
  Administered 2019-10-22 – 2019-11-10 (×20): 37.5 mg
  Filled 2019-10-21 (×20): qty 1

## 2019-10-21 MED ORDER — LEVOTHYROXINE SODIUM 25 MCG PO TABS
50.0000 ug | ORAL_TABLET | Freq: Every day | ORAL | Status: DC
  Administered 2019-10-22 – 2019-11-12 (×22): 50 ug
  Filled 2019-10-21 (×2): qty 1
  Filled 2019-10-21: qty 2
  Filled 2019-10-21: qty 1
  Filled 2019-10-21: qty 2
  Filled 2019-10-21 (×2): qty 1
  Filled 2019-10-21: qty 2
  Filled 2019-10-21 (×9): qty 1
  Filled 2019-10-21 (×2): qty 2
  Filled 2019-10-21 (×4): qty 1

## 2019-10-21 MED ORDER — VALPROATE SODIUM 250 MG/5ML PO SOLN
250.0000 mg | Freq: Two times a day (BID) | ORAL | Status: DC
  Filled 2019-10-21: qty 5

## 2019-10-21 MED ORDER — DEXMEDETOMIDINE HCL IN NACL 400 MCG/100ML IV SOLN
0.4000 ug/kg/h | INTRAVENOUS | Status: DC
  Administered 2019-10-21: 0.4 ug/kg/h via INTRAVENOUS
  Administered 2019-10-21: 1.2 ug/kg/h via INTRAVENOUS
  Administered 2019-10-21: 0.9 ug/kg/h via INTRAVENOUS
  Administered 2019-10-22 (×4): 0.8 ug/kg/h via INTRAVENOUS
  Administered 2019-10-23 (×3): 0.6 ug/kg/h via INTRAVENOUS
  Administered 2019-10-24 – 2019-10-26 (×6): 0.4 ug/kg/h via INTRAVENOUS
  Filled 2019-10-21 (×10): qty 100
  Filled 2019-10-21: qty 200
  Filled 2019-10-21 (×2): qty 100
  Filled 2019-10-21: qty 200
  Filled 2019-10-21: qty 100

## 2019-10-21 MED ORDER — VECURONIUM BROMIDE 10 MG IV SOLR
0.1000 mg/kg | INTRAVENOUS | Status: DC | PRN
  Administered 2019-10-21 – 2019-10-27 (×5): 9.6 mg via INTRAVENOUS
  Filled 2019-10-21 (×5): qty 10

## 2019-10-21 MED ORDER — DOCUSATE SODIUM 50 MG/5ML PO LIQD
100.0000 mg | Freq: Two times a day (BID) | ORAL | Status: DC
  Administered 2019-10-21 – 2019-11-07 (×30): 100 mg
  Filled 2019-10-21 (×30): qty 10

## 2019-10-21 NOTE — Progress Notes (Signed)
Pharmacy Antibiotic Note - Initial   Jacqueline Lucas is a 69 y.o. female admitted on 10/19/2019 with COVID related respiratory failure requiring mechanical ventilation, now with suspected pneumonia.  Pharmacy has been consulted for cefepime dosing.  Tmax is 101.7, WBC are up to 11.9 from 8.9. CXR also is worse than yesterday with increased patchy infiltration. SCr is down since admission, now ~0.5, and CrCl ~75.  Plan: Initiate Cefepime 2 grams IV every 8 hours Follow up repeat blood cultures Monitor clinical progress, length of therapy and plans for de-escalation  Weight: 96.3 kg (212 lb 4.9 oz) (bed malfunction)  Temp (24hrs), Avg:99.9 F (37.7 C), Min:98.1 F (36.7 C), Max:101.7 F (38.7 C)  Recent Labs  Lab 10/14/19 1917 10/14/19 1917 10/06/2019 2043 10/16/2019 2044 10/26/2019 2044 10/09/2019 2220 10/18/19 0101 10/19/19 0500 10/19/19 2041 10/20/19 0404 10/21/19 0409  WBC 4.1  --   --  9.3  --   --   --  8.9  --  10.1 11.9*  CREATININE 0.57   < >  --  1.57*   < >  --  1.40* 1.12* 1.15* 0.85 0.51  LATICACIDVEN  --   --  1.5  --   --  0.9  --   --   --   --   --    < > = values in this interval not displayed.    Estimated Creatinine Clearance: 74.7 mL/min (by C-G formula based on SCr of 0.51 mg/dL).    Allergies  Allergen Reactions  . Morphine Nausea And Vomiting and Other (See Comments)    Ok with anti-nausea medication  . Lyrica [Pregabalin] Other (See Comments)    Joint swelling  . Metformin And Related Diarrhea  . Adhesive [Tape] Rash    Please use paper tape  . Diazepam Nausea And Vomiting and Other (See Comments)    Can take with anti-nausea medication  . Latex Rash and Other (See Comments)    "Band-Aid glue"  . Rash Away [Petrolatum-Zinc Oxide] Swelling and Rash  . Salicylates Other (See Comments)    Headaches    Antimicrobials this admission: Cefepime 8/24 >>  Dose adjustments this admission:  Microbiology results: 8/20 BCx: ngtd 8/20 UCx: ngtd  8/21  MRSA PCR: neg  Thank you for allowing pharmacy to be a part of this patient's care.  Mercy Riding, PharmD PGY1 Acute Care Pharmacy Resident Please refer to [facility] for unit-specific pharmacist

## 2019-10-21 NOTE — Progress Notes (Addendum)
NAME:  Jacqueline Lucas, MRN:  [id number], DOB:  03-06-1950, LOS: 4 ADMISSION DATE:  10/20/2019, CONSULTATION DATE:  8/20 REFERRING MD:  EDP, CHIEF COMPLAINT:  Dyspnea   Brief History   69 y/o female admitted on 8/19 with severe acute respiratory failure with hypoxemia due to COVID 19 pneumonia.  Past Medical History  Asthma Obesity Hypertension Hyperlipidemia Degenerative disk disease Depression Fibromyalgia Heart murmur GERD Hypothyroidism IDA Insomnia Fe def anemia  Significant Hospital Events   8/20 Admit to PCCM 8/21 failed SBT, oxygenation worsened thorought the day  Consults:    Procedures:  8/20 ETT > 8/21  Significant Diagnostic Tests:    Micro Data:  8/20 SARS COV 2>>positive 8/20 UC>> no growth 8/20 BCx2>> no growth to date 8/24 BCx2  Antimicrobials/COVID  8/20 Remdesivir >  8/20 baricitinib >  8/20 solumedrol >  Interim history/subjective:  No acute event overnight.  Patient is seen at bedside.  She is sedated but a little bit more responsive compared to yesterday.  Continue to wean off oxygen, hopefully can be extubated tomorrow.  Objective   Blood pressure (!) 124/59, pulse 74, temperature 99.9 F (37.7 C), resp. rate (!) 29, weight 96.3 kg, SpO2 90 %. CVP:  [3 mmHg-10 mmHg] 10 mmHg  Vent Mode: PRVC FiO2 (%):  [40 %-50 %] 50 % Set Rate:  [28 bmp] 28 bmp Vt Set:  [320 mL] 320 mL PEEP:  [8 cmH20] 8 cmH20 Plateau Pressure:  [20 cmH20-22 cmH20] 22 cmH20   Intake/Output Summary (Last 24 hours) at 10/21/2019 0957 Last data filed at 10/21/2019 3382 Gross per 24 hour  Intake 2613.09 ml  Output 2825 ml  Net -211.91 ml   Filed Weights   10/19/19 0200 10/20/19 0401 10/21/19 0413  Weight: 92.7 kg 96.3 kg 96.3 kg    Examination:  Physical Exam Constitutional:      General: She is not in acute distress.    Appearance: She is not toxic-appearing.  HENT:     Head: Normocephalic.     Comments: Intubated Eyes:     General: No scleral  icterus.       Right eye: No discharge.        Left eye: No discharge.     Conjunctiva/sclera: Conjunctivae normal.     Pupils: Pupils are equal, round, and reactive to light.  Cardiovascular:     Rate and Rhythm: Normal rate and regular rhythm.     Heart sounds: Normal heart sounds. No murmur heard.   Pulmonary:     Comments: Diminished breath sounds bilaterally Abdominal:     General: Bowel sounds are normal. There is no distension.     Palpations: Abdomen is soft.  Musculoskeletal:     Right lower leg: No edema.     Left lower leg: No edema.  Skin:    General: Skin is warm.     Coloration: Skin is not jaundiced.  Neurological:     Comments: Sedated  More responsive compared to yesterday     Resolved Hospital Problem list     Assessment & Plan:  Acute respiratory failure requiring mechanical ventilation due to COVID-19 Suspecting HCAP AKI-resolved Hypertension Diabetes with hyperglycemia, A1c 6.6 Hypertension Hypothyroidism Depression  Plan:  Acute respiratory failure requiring mechanical ventilation COVID-19 pneumonia Underlying asthma Patient is doing well on pressor support PEEP 8/pressure 10.  However her chest x-ray looks worse compared to yesterday with increased patchy infiltration.  We will continue pressor support and wean off of sedation.  Hopefully  can extubate tomorrow -Change settings to mechanical ventilation per ARDS protocol -Target TVol 6-8cc/kgIBW -Target Plateau Pressure < 30cm H20 -Target driving pressure less than 15 cm of water -Target PaO2 55-65: titrate PEEP/FiO2 per protocol -As long as PaO2 to FiO2 ratio is less than 1:150 position in prone position for 16 hours a day -VAP bundle.   -Sedation with fentanyl and propofol.  We will try to come off of propofol.  Adding Precedex.  Wean off as tolerated -Remdesivir, Solu-Medrol and baricitinib per protocol   Suspecting HCAP Patient has been febrile this morning with T-max of 101.7.  Chest  x-ray also looks worse today.  WBC still elevated to 11.9.  We have obtained tracheal aspirate culture and blood culture.  Also order procalcitonin.  We will start her on empirically cefepime.  We will narrow based on culture results. -Cefepime per pharmacy   Diabetes with hyperglycemia A1c of 6.6.  CBG better today.  Continue tight blood glucose control in the setting of steroid use. -Continue CBG -SSI -NovoLog to 7 units every 4 hours   AKI-resolved -BMP daily -Decrease free water to 100 cc every 8 hours given sodium 136.   Hypothyroidism TSH of 0.9.  Continue synthroid 50 daily   Depression Continue home effexor and divalproate   Hypertension Currently normotensive -Hold home med   Best practice:  Diet:tube feeding  Pain/Anxiety/Delirium protocol (if indicated): as above VAP protocol (if indicated): yes DVT prophylaxis: lovenox GI prophylaxis: Pantoprazole for stress ulcer prophylaxis Glucose control: SSI and NovoLog Mobility: Bedrest Code Status: full Family Communication: We will update family Disposition: remain in ICU for ventilator management  Critical care time: 30 minutes    [name], DO Internal Medicine Residency My pager: [phone]   PCCM Attending:   69 yo COVID19 ARDS on MV   BP (!) 129/58   Pulse 67   Temp (!) 100.9 F (38.3 C)   Resp (!) 25   Wt 96.3 kg Comment: bed malfunction  SpO2 93%   BMI 36.44 kg/m   Gen: elderly fm, intubated on life support  Heart: RRR, s1 s2 Lungs: BL vented breaths  Abd: soft, nt nd   Labs reviewed  CXR: BL infiltrates, multifocal pneumonia, The patient's images have been independently reviewed by me.   A:  Covid19 PNA ARDS  AHRF on MV AKI  DMII, Hyperglycemia Fevers     P: Weaning from vent today  She looks ok in PS  Still requiring high ps  Can consider extubation maybe tomorrow  Switching to precedex to day and fentanyl  Continue rem + dec + bar  Observe the fevers, likely culture if  she spikes again   This patient is critically ill with multiple organ system failure; which, requires frequent high complexity decision making, assessment, support, evaluation, and titration of therapies. This was completed through the application of advanced monitoring technologies and extensive interpretation of multiple databases. During this encounter critical care time was devoted to patient care services described in this note for 32 minutes.  [facility] 10/21/2019 6:08 PM

## 2019-10-21 NOTE — Progress Notes (Signed)
PT Cancellation Note  Patient Details Name: Jacqueline Lucas MRN: [id number] DOB: 01-Aug-1950   Cancelled Treatment:    Reason Eval/Treat Not Completed: Other (comment) (Signing off due to continued status decline)  10/21/2019  [name], PT Acute Rehabilitation Services [phone]  (pager) [phone]  (office)   [name] 10/21/2019, 10:14 AM

## 2019-10-21 NOTE — Telephone Encounter (Signed)
Copied from [place] [id number]. Topic: General - Other >> Oct 17, 2019  2:04 PM Leward Quan A wrote: Reason for CRM: Patient son Idolina Primer called to say that patient have been sick about a week and have not been able to get out of her bed for the past 2 days. He os requesting that Dr Ancil Boozer do a direct admit to the hospital so she does not have to go to the ED and wait. Please contact Danny at Ph#  [phone] >> Oct 17, 2019  3:32 PM Cathrine Muster, CMA wrote: Directed pt to ER

## 2019-10-21 NOTE — Progress Notes (Signed)
OT Cancellation Note  Patient Details Name: Jacqueline Lucas MRN: [id number] DOB: [birth year]   Cancelled Treatment:    Reason Eval/Treat Not Completed: Medical issues which prohibited therapy; pt remains intubated/sedated. Spoke with RN and pt currently not appropriate for therapies. Acute OT to sign off for now - please reorder as pt is appropriate/able to participate.   Lou Cal, OT Acute Rehabilitation Services Pager [phone] Office [phone]   Raymondo Band 10/21/2019, 8:15 AM

## 2019-10-21 NOTE — Progress Notes (Signed)
Dr. Alfonse Spruce made aware that patient has new onset fever at 101.2. Orders for blood cultures, respiratory cultures, and antibiotics given.

## 2019-10-21 NOTE — Progress Notes (Signed)
[place] Progress Note Patient Name: Jacqueline Lucas DOB: [birth year] MRN: [id number]   Date of Service  10/21/2019  HPI/Events of Note  Ventilator asynchrony - Already on heavy sedation with Fentanyl, Propofol and Precedex IV infusions.   eICU Interventions  Plan: 1. Vecuronium 0.1 mg/kg Q 1 hour PRN ventilator asynchrony.  2. Artificial tears Ophthalmic solution 2.5% 1 drop to both eyes TID.     Intervention Category Major Interventions: Other:  [name] 10/21/2019, 8:25 PM

## 2019-10-22 ENCOUNTER — Inpatient Hospital Stay (HOSPITAL_COMMUNITY): Payer: Medicare Other

## 2019-10-22 LAB — CULTURE, BLOOD (ROUTINE X 2)
Culture: NO GROWTH
Culture: NO GROWTH
Special Requests: ADEQUATE
Special Requests: ADEQUATE

## 2019-10-22 LAB — CBC
HCT: 34.8 % — ABNORMAL LOW (ref 36.0–46.0)
Hemoglobin: 11.5 g/dL — ABNORMAL LOW (ref 12.0–15.0)
MCH: 32.1 pg (ref 26.0–34.0)
MCHC: 33 g/dL (ref 30.0–36.0)
MCV: 97.2 fL (ref 80.0–100.0)
Platelets: 247 10*3/uL (ref 150–400)
RBC: 3.58 MIL/uL — ABNORMAL LOW (ref 3.87–5.11)
RDW: 14.4 % (ref 11.5–15.5)
WBC: 10.4 10*3/uL (ref 4.0–10.5)
nRBC: 0.3 % — ABNORMAL HIGH (ref 0.0–0.2)

## 2019-10-22 LAB — POCT I-STAT 7, (LYTES, BLD GAS, ICA,H+H)
Acid-Base Excess: 1 mmol/L (ref 0.0–2.0)
Bicarbonate: 28.5 mmol/L — ABNORMAL HIGH (ref 20.0–28.0)
Calcium, Ion: 1.24 mmol/L (ref 1.15–1.40)
HCT: 34 % — ABNORMAL LOW (ref 36.0–46.0)
Hemoglobin: 11.6 g/dL — ABNORMAL LOW (ref 12.0–15.0)
O2 Saturation: 85 %
Patient temperature: 38.7
Potassium: 4.8 mmol/L (ref 3.5–5.1)
Sodium: 141 mmol/L (ref 135–145)
TCO2: 30 mmol/L (ref 22–32)
pCO2 arterial: 63.6 mmHg — ABNORMAL HIGH (ref 32.0–48.0)
pH, Arterial: 7.268 — ABNORMAL LOW (ref 7.350–7.450)
pO2, Arterial: 65 mmHg — ABNORMAL LOW (ref 83.0–108.0)

## 2019-10-22 LAB — GLUCOSE, CAPILLARY
Glucose-Capillary: 162 mg/dL — ABNORMAL HIGH (ref 70–99)
Glucose-Capillary: 147 mg/dL — ABNORMAL HIGH (ref 70–99)
Glucose-Capillary: 195 mg/dL — ABNORMAL HIGH (ref 70–99)
Glucose-Capillary: 212 mg/dL — ABNORMAL HIGH (ref 70–99)
Glucose-Capillary: 189 mg/dL — ABNORMAL HIGH (ref 70–99)
Glucose-Capillary: 181 mg/dL — ABNORMAL HIGH (ref 70–99)

## 2019-10-22 LAB — BASIC METABOLIC PANEL
Anion gap: 9 (ref 5–15)
BUN: 25 mg/dL — ABNORMAL HIGH (ref 8–23)
CO2: 23 mmol/L (ref 22–32)
Calcium: 7.2 mg/dL — ABNORMAL LOW (ref 8.9–10.3)
Chloride: 103 mmol/L (ref 98–111)
Creatinine, Ser: 0.47 mg/dL (ref 0.44–1.00)
GFR calc Af Amer: >60 mL/min (ref >60)
GFR calc non Af Amer: >60 mL/min (ref >60)
Glucose, Bld: 157 mg/dL — ABNORMAL HIGH (ref 70–99)
Potassium: 3.8 mmol/L (ref 3.5–5.1)
Sodium: 135 mmol/L (ref 135–145)

## 2019-10-22 MED ORDER — BISACODYL 10 MG RE SUPP
10.0000 mg | Freq: Once | RECTAL | Status: AC
  Administered 2019-10-22: 10 mg via RECTAL
  Filled 2019-10-22: qty 1

## 2019-10-22 NOTE — Progress Notes (Signed)
SLP Cancellation Note  Patient Details Name: Jacqueline Lucas MRN: [id number] DOB: 05/09/1950   Cancelled treatment:       Reason Eval/Treat Not Completed: Patient not medically ready. Pt remains ventilated. Will sign off for now   Lynann Beaver 10/22/2019, 7:29 AM

## 2019-10-22 NOTE — Procedures (Signed)
Cortrak  Person Inserting Tube:  Lova Urbieta, RD Tube Type:  Cortrak - 43 inches Tube Location:  Right nare Initial Placement:  Stomach Secured by: Bridle Technique Used to Measure Tube Placement:  Documented cm marking at nare/ corner of mouth Cortrak Secured At:  65 cm   No x-ray is required. RN may begin using tube.   If the tube becomes dislodged please keep the tube and contact the Cortrak team at [email or web address] ([email or web address]) for replacement.  If after hours and replacement cannot be delayed, place a NG tube and confirm placement with an abdominal x-ray.    Mariana Single RD, LDN Clinical Nutrition Pager listed in [place]

## 2019-10-22 NOTE — Progress Notes (Addendum)
NAME:  Jacqueline Lucas, MRN:  [id number], DOB:  [birth year], LOS: 5 ADMISSION DATE:  09/28/2019, CONSULTATION DATE:  8/20 REFERRING MD:  EDP, CHIEF COMPLAINT:  Dyspnea   Brief History   69 y/o female admitted on 8/19 with severe acute respiratory failure with hypoxemia due to COVID 19 pneumonia.  Past Medical History  Asthma Obesity Hypertension Hyperlipidemia Degenerative disk disease Depression Fibromyalgia Heart murmur GERD Hypothyroidism IDA Insomnia Fe def anemia  Significant Hospital Events   8/20 Admit to PCCM 8/21 failed SBT, oxygenation worsened thorought the day  Consults:    Procedures:  8/20 ETT > 8/21  Significant Diagnostic Tests:    Micro Data:  8/20 SARS COV 2>>positive 8/20 UC>> no growth 8/20 BCx2>> no growth to date 8/24 BCx2  Antimicrobials/COVID  8/20 Remdesivir >  8/20 baricitinib >  8/20 solumedrol >  Interim history/subjective:  Overnight, vecuronium was added due to ventilator asynchrony.  Patient is seen at bedside.  She is sedated.  Patient require more oxygen likely secondary to a new pneumonia.  Objective   Blood pressure (!) 146/62, pulse 84, temperature (!) 101.7 F (38.7 C), temperature source Bladder, resp. rate 19, weight 96.4 kg, SpO2 (!) 81 %.    Vent Mode: PRVC FiO2 (%):  [50 %-100 %] 100 % Set Rate:  [28 bmp] 28 bmp Vt Set:  [320 mL] 320 mL PEEP:  [8 cmH20-12 cmH20] 12 cmH20 Plateau Pressure:  [20 cmH20] 20 cmH20   Intake/Output Summary (Last 24 hours) at 10/22/2019 1258 Last data filed at 10/22/2019 1200 Gross per 24 hour  Intake 3386.35 ml  Output 1190 ml  Net 2196.35 ml   Filed Weights   10/20/19 0401 10/21/19 0413 10/22/19 0431  Weight: 96.3 kg 96.3 kg 96.4 kg    Examination:  Physical Exam Constitutional:      General: She is not in acute distress.    Appearance: She is not toxic-appearing.  HENT:     Head: Normocephalic.     Comments: Intubated Cardiovascular:     Rate and Rhythm: Normal  rate and regular rhythm.     Heart sounds: Normal heart sounds. No murmur heard.   Pulmonary:     Effort: No respiratory distress.     Comments: Diminished breath sounds bilaterally Abdominal:     General: Bowel sounds are normal. There is no distension.     Palpations: Abdomen is soft.  Musculoskeletal:     Right lower leg: No edema.     Left lower leg: No edema.  Skin:    General: Skin is warm.     Coloration: Skin is not jaundiced.  Neurological:     Comments: [facility] Problem list     Assessment & Plan:  Acute respiratory failure requiring mechanical ventilation due to COVID-19 Suspecting HCAP AKI-resolved Hypertension Diabetes with hyperglycemia, A1c 6.6 Hypertension Hypothyroidism Depression  Plan:  Acute respiratory failure requiring mechanical ventilation COVID-19 pneumonia Underlying asthma Patient require more oxygen overnight likely secondary to a new HCAP.  Currently at FiO2 100%. -Target TVol 6-8cc/kgIBW -Target Plateau Pressure < 30cm H20 -Target driving pressure less than 15 cm of water -Target PaO2 55-65: titrate PEEP/FiO2 per protocol -As long as PaO2 to FiO2 ratio is less than 1:150 position in prone position for 16 hours a day -VAP bundle.   -Sedation with fentanyl, Precedex and propofol.  Attempt to come off of propofol.  Add vecuronium as needed. -Remdesivir, Solu-Medrol and baricitinib per protocol -Check  ABG.  May possibly need to prone   Suspecting HCAP Chest x-ray today shows increased airspace opacity compared to 1 day prior.  We will treat for HCAP even though pro-Cal<0.1.  Respiratory culture Gram stain show abundant gram-negative rods and gram positive cocci in pairs.  Continue cefepime and narrow per culture results  -Cefepime (day2) -Trend fever curve and WBC   Diabetes with hyperglycemia A1c of 6.6.  CBG better today.  Continue tight blood glucose control in the setting of steroid use. -Continue  CBG -SSI -NovoLog to 7 units every 4 hours   Hypothyroidism TSH of 0.9.  Continue synthroid 50 daily   Depression Continue home effexor and divalproate   Hypertension Currently normotensive -Hold home med   Best practice:  Diet:tube feeding  Pain/Anxiety/Delirium protocol (if indicated): as above VAP protocol (if indicated): yes DVT prophylaxis: lovenox GI prophylaxis: Pantoprazole for stress ulcer prophylaxis Glucose control: SSI and NovoLog Mobility: Bedrest Code Status: full Family Communication: will update family Disposition: remain in ICU for ventilator management  Critical care time: 30 minutes    Jacqueline Gerold, DO Internal Medicine Residency My pager: [phone]   PCCM attending:  69 year old female COVID-19 pneumonia, ARDS on mechanical ventilation BP (!) 145/63   Pulse 86   Temp (!) 102 F (38.9 C)   Resp (!) 28   Ht 5\' 4"  (1.626 m)   Wt 96.4 kg   SpO2 (!) 88%   BMI 36.48 kg/m   General: Elderly female intubated mechanical life support Heart: Regular rhythm S1-S2 Lungs: Bilateral mechanical breath sounds Abdomen: Soft nontender nondistended  Labs: Reviewed Chest x-ray: Bilateral airspace disease The patient's images have been independently reviewed by me.    Assessment: COVID-19 pneumonia ARDS secondary to above Acute hypoxemic respiratory failure mechanical ventilation AKI Hyperglycemia with type 2 diabetes Persistent fevers, statin negative  Plan: Continue to wean from mechanical support Increased FiO2 requirements Probably not planning to extubate today We will continue pressure support trials Doing better on Precedex and fentanyl. Continue Covid treatments. Procalcitonin negative, no antibiotics at this time.  This patient is critically ill with multiple organ system failure; which, requires frequent high complexity decision making, assessment, support, evaluation, and titration of therapies. This was completed through the  application of advanced monitoring technologies and extensive interpretation of multiple databases. During this encounter critical care time was devoted to patient care services described in this note for 33 minutes.  Jacqueline Nash, DO [facility] 10/22/2019 6:58 PM

## 2019-10-23 ENCOUNTER — Inpatient Hospital Stay (HOSPITAL_COMMUNITY): Payer: Medicare Other

## 2019-10-23 DIAGNOSIS — J9601 Acute respiratory failure with hypoxia: Secondary | ICD-10-CM

## 2019-10-23 DIAGNOSIS — U071 COVID-19: Secondary | ICD-10-CM

## 2019-10-23 DIAGNOSIS — R7989 Other specified abnormal findings of blood chemistry: Secondary | ICD-10-CM

## 2019-10-23 LAB — POCT I-STAT 7, (LYTES, BLD GAS, ICA,H+H)
Acid-Base Excess: 3 mmol/L — ABNORMAL HIGH (ref 0.0–2.0)
Bicarbonate: 27.5 mmol/L (ref 20.0–28.0)
Calcium, Ion: 1.27 mmol/L (ref 1.15–1.40)
HCT: 31 % — ABNORMAL LOW (ref 36.0–46.0)
Hemoglobin: 10.5 g/dL — ABNORMAL LOW (ref 12.0–15.0)
O2 Saturation: 94 %
Patient temperature: 99.9
Potassium: 4.2 mmol/L (ref 3.5–5.1)
Sodium: 141 mmol/L (ref 135–145)
TCO2: 29 mmol/L (ref 22–32)
pCO2 arterial: 41.3 mmHg (ref 32.0–48.0)
pH, Arterial: 7.434 (ref 7.350–7.450)
pO2, Arterial: 72 mmHg — ABNORMAL LOW (ref 83.0–108.0)

## 2019-10-23 LAB — BASIC METABOLIC PANEL
Anion gap: 9 (ref 5–15)
BUN: 25 mg/dL — ABNORMAL HIGH (ref 8–23)
CO2: 25 mmol/L (ref 22–32)
Calcium: 8.2 mg/dL — ABNORMAL LOW (ref 8.9–10.3)
Chloride: 105 mmol/L (ref 98–111)
Creatinine, Ser: 0.55 mg/dL (ref 0.44–1.00)
GFR calc Af Amer: >60 mL/min (ref >60)
GFR calc non Af Amer: >60 mL/min (ref >60)
Glucose, Bld: 179 mg/dL — ABNORMAL HIGH (ref 70–99)
Potassium: 4.3 mmol/L (ref 3.5–5.1)
Sodium: 139 mmol/L (ref 135–145)

## 2019-10-23 LAB — D-DIMER, QUANTITATIVE: D-Dimer, Quant: >20 ug/mL-FEU — ABNORMAL HIGH (ref 0.00–0.50)

## 2019-10-23 LAB — CBC
HCT: 31.7 % — ABNORMAL LOW (ref 36.0–46.0)
Hemoglobin: 10.2 g/dL — ABNORMAL LOW (ref 12.0–15.0)
MCH: 31.3 pg (ref 26.0–34.0)
MCHC: 32.2 g/dL (ref 30.0–36.0)
MCV: 97.2 fL (ref 80.0–100.0)
Platelets: 226 10*3/uL (ref 150–400)
RBC: 3.26 MIL/uL — ABNORMAL LOW (ref 3.87–5.11)
RDW: 14.5 % (ref 11.5–15.5)
WBC: 9.9 10*3/uL (ref 4.0–10.5)
nRBC: 0.4 % — ABNORMAL HIGH (ref 0.0–0.2)

## 2019-10-23 LAB — GLUCOSE, CAPILLARY
Glucose-Capillary: 183 mg/dL — ABNORMAL HIGH (ref 70–99)
Glucose-Capillary: 170 mg/dL — ABNORMAL HIGH (ref 70–99)
Glucose-Capillary: 199 mg/dL — ABNORMAL HIGH (ref 70–99)
Glucose-Capillary: 224 mg/dL — ABNORMAL HIGH (ref 70–99)
Glucose-Capillary: 217 mg/dL — ABNORMAL HIGH (ref 70–99)
Glucose-Capillary: 204 mg/dL — ABNORMAL HIGH (ref 70–99)

## 2019-10-23 MED ORDER — ATROPINE SULFATE 1 MG/10ML IJ SOSY
PREFILLED_SYRINGE | INTRAMUSCULAR | Status: AC
  Filled 2019-10-23: qty 10

## 2019-10-23 MED ORDER — METHYLPREDNISOLONE SODIUM SUCC 40 MG IJ SOLR
20.0000 mg | Freq: Two times a day (BID) | INTRAMUSCULAR | Status: AC
  Administered 2019-10-23 – 2019-10-26 (×7): 20 mg via INTRAVENOUS
  Filled 2019-10-23 (×7): qty 1

## 2019-10-23 MED ORDER — HYDRALAZINE HCL 20 MG/ML IJ SOLN
10.0000 mg | INTRAMUSCULAR | Status: DC | PRN
  Administered 2019-10-23 – 2019-11-08 (×5): 10 mg via INTRAVENOUS
  Filled 2019-10-23 (×5): qty 1

## 2019-10-23 MED ORDER — INSULIN ASPART 100 UNIT/ML ~~LOC~~ SOLN
10.0000 [IU] | SUBCUTANEOUS | Status: DC
  Administered 2019-10-23 – 2019-10-27 (×21): 10 [IU] via SUBCUTANEOUS

## 2019-10-23 MED ORDER — METHYLPREDNISOLONE SODIUM SUCC 40 MG IJ SOLR
20.0000 mg | Freq: Every day | INTRAMUSCULAR | Status: AC
  Administered 2019-10-27 – 2019-10-29 (×3): 20 mg via INTRAVENOUS
  Filled 2019-10-23 (×3): qty 1

## 2019-10-23 MED ORDER — ENOXAPARIN SODIUM 100 MG/ML ~~LOC~~ SOLN
1.0000 mg/kg | Freq: Two times a day (BID) | SUBCUTANEOUS | Status: DC
  Administered 2019-10-23 – 2019-10-24 (×2): 97.5 mg via SUBCUTANEOUS
  Filled 2019-10-23 (×3): qty 0.97

## 2019-10-23 NOTE — Progress Notes (Signed)
Patients head turned to left side without any complications. RT will continue to monitor.

## 2019-10-23 NOTE — Progress Notes (Signed)
VASCULAR LAB    Bilateral lower extremity venous duplex completed.    Preliminary report:  See CV proc for preliminary results.   Natane Heward, RVT 10/23/2019, 12:30 PM

## 2019-10-23 NOTE — Progress Notes (Signed)
Pt proned at this time with no complications. Pt et securely taped in proper position. VS within normal limits.

## 2019-10-23 NOTE — Progress Notes (Signed)
[facility] Progress Note Patient Name: Jacqueline Lucas DOB: 07-Jul-1950 MRN: [id number]   Date of Service  10/23/2019  HPI/Events of Note  Hypertension - BP = 176/73.   eICU Interventions  Plan: 1. Hydralazine 10 mg IV Q 4 hours PRN SBP > 170 or DBP > 100.     Intervention Category Major Interventions: Hypertension - evaluation and management  Jacqueline Lucas 10/23/2019, 10:38 PM

## 2019-10-23 NOTE — Progress Notes (Signed)
Spoke with patient's son Idolina Primer and provided full update/answered all questions.

## 2019-10-23 NOTE — Progress Notes (Addendum)
NAME:  Jacqueline Lucas, MRN:  [id number], DOB:  [birth year], LOS: 6 ADMISSION DATE:  10/01/2019, CONSULTATION DATE:  8/20 REFERRING MD:  EDP, CHIEF COMPLAINT:  Dyspnea   Brief History   69 y/o female admitted on 8/19 with severe acute respiratory failure with hypoxemia due to COVID 19 pneumonia.  Past Medical History   has a past medical history of Allergy, Allergy to adhesive tape, Anemia, Anginal pain ([place]), Anxiety, Arthritis, Asthma, Bronchitis, Cataract, Chronic nausea, Claustrophobia, DDD (degenerative disc disease), lumbar, Depression, Dry eyes, Fibromyalgia, GERD (gastroesophageal reflux disease), H/O bladder infections, Heart murmur, Hematuria, Hyperlipidemia, Hypertension, Hypothyroidism, IBS (irritable bowel syndrome), IDA (iron deficiency anemia) (05/13/2019), Insomnia, Lumbar neuritis, Migraines, Obesity, Osteoporosis, Palpitation, RLS (restless legs syndrome), Shoulder fracture, right, Sicca ([place]), Tachycardia, Thyroid disease, and Wears dentures.   Significant Hospital Events   8/20 Admit to PCCM 8/21 failed SBT, oxygenation worsened thorought the day  Consults:    Procedures:  8/20 ETT > 8/21  Significant Diagnostic Tests:  BLE venous dopplers 8/26>>> neg   Micro Data:  8/20 SARS COV 2>>positive 8/20 UC>> no growth 8/20 BCx2>> no growth to date 8/24 BCx2  Antimicrobials/COVID:  8/20 Remdesivir >  8/20 baricitinib >  8/20 solumedrol >   Interim history/subjective:  Paralytic added for vent dyssynchrony  Remains on 80%/14 peep  Objective   Blood pressure (!) 121/52, pulse (!) 59, temperature 99.1 F (37.3 C), resp. rate (!) 28, height 5\' 4"  (1.626 m), weight 96.4 kg, SpO2 91 %.    Vent Mode: PRVC FiO2 (%):  [80 %-100 %] 80 % Set Rate:  [28 bmp] 28 bmp Vt Set:  [440 mL] 440 mL PEEP:  [14 cmH20] 14 cmH20 Plateau Pressure:  [28 cmH20-31 cmH20] 31 cmH20   Intake/Output Summary (Last 24 hours) at 10/23/2019 1159 Last data filed at 10/23/2019 1000 Gross per  24 hour  Intake 3364.74 ml  Output 1650 ml  Net 1714.74 ml   Filed Weights   10/21/19 0413 10/22/19 0431 10/23/19 0500  Weight: 96.3 kg 96.4 kg 96.4 kg    Examination: General: chronically ill appearing female, NAD on vent  HENT: mm moist, no JVD  Lungs: resps even non labored on vent, diminished  Cardiovascular: s1s2 rrr Abdomen: soft, tol TF  Extremities: warm and dry, no sig edema  Neuro: heavily sedated, RASS -4  Resolved Hospital Problem list     Assessment & Plan:  Acute respiratory failure requiring mechanical ventilation due to COVID-19 Suspecting HCAP AKI-resolved Hypertension Diabetes with hyperglycemia, A1c 6.6 Hypertension Hypothyroidism Depression  Plan:  Acute respiratory failure requiring mechanical ventilation COVID-19 pneumonia Underlying asthma Concern for new HCAP.  PLAN -  -low Vt ventilation 4-8cc/kg -goal plateau pressure <30, driving pressure <23 cm H2O -target PaO2 55-65, titrate PEEP/FiO2 per ARDS protocol  -if P/F ratio <150, consider prone therapy for 16 hours per day -goal CVP <4, diuresis as necessary -VAP prevention measures  -follow intermittent CXR  -Sedation with fentanyl, Precedex and propofol.  Attempt to come off of propofol. - paralytic as needed  -Remdesivir, Solu-Medrol and baricitinib per protocol - continue cefepime D3  -follow culture data  Trend wbc, fever curve   Elevated Ddimer >20 Concern for VTE BLE venous doppler neg  PLAN  Empiric heparin per pharmacy for now   Hypotension  PLAN -  Remains on low dose levo gtt - titrate as able to keep MAP >65  Diabetes with hyperglycemia A1c of 6.6.  PLAN -  -Continue CBG -SSI  -NovoLog 7  units every 4 hours   Hypothyroidism TSH of 0.9.  Continue synthroid 50 daily   Depression Continue home effexor and divalproate   Hypertension Holding home antiHTN   Best practice:  Diet:tube feeding  Pain/Anxiety/Delirium protocol (if indicated): as  above VAP protocol (if indicated): yes DVT prophylaxis: lovenox GI prophylaxis: Pantoprazole for stress ulcer prophylaxis Glucose control: SSI and NovoLog Mobility: Bedrest Code Status: full Family Communication: will update family Disposition: remain in ICU for ventilator management  Labs   CBC: Recent Labs  Lab 10/25/2019 2044 10/18/19 0029 10/19/19 0500 10/20/19 0242 10/20/19 0404 10/20/19 0404 10/21/19 0409 10/22/19 0400 10/22/19 1100 10/23/19 0445 10/23/19 0536  WBC 9.3   < > 8.9  --  10.1  --  11.9* 10.4  --  9.9  --   NEUTROABS 7.5  --  7.7  --  8.5*  --   --   --   --   --   --   HGB 12.8   < > 12.5   < > 11.7*   < > 12.0 11.5* 11.6* 10.2* 10.5*  HCT 36.5   < > 36.9   < > 35.0*   < > 35.1* 34.8* 34.0* 31.7* 31.0*  MCV 91.9   < > 90.9  --  93.3  --  95.4 97.2  --  97.2  --   PLT 217   < > 237  --  368  --  332 247  --  226  --    < > = values in this interval not displayed.    Basic Metabolic Panel: Recent Labs  Lab 10/18/19 0101 10/18/19 1553 10/19/19 0500 10/19/19 0500 10/19/19 1905 10/19/19 2041 10/20/19 0242 10/20/19 0404 10/20/19 0404 10/21/19 0409 10/22/19 0400 10/22/19 1100 10/23/19 0445 10/23/19 0536  NA   < >  --  138   < >  --  140   < > 138   < > 136 135 141 139 141  K   < >  --  3.8   < >  --  3.3*   < > 4.2   < > 3.9 3.8 4.8 4.3 4.2  CL   < >  --  107   < >  --  105  --  106  --  103 103  --  105  --   CO2   < >  --  20*   < >  --  24  --  24  --  24 23  --  25  --   GLUCOSE   < >  --  243*   < >  --  235*  --  216*  --  185* 157*  --  179*  --   BUN   < >  --  37*   < >  --  42*  --  37*  --  24* 25*  --  25*  --   CREATININE   < >  --  1.12*   < >  --  1.15*  --  0.85  --  0.51 0.47  --  0.55  --   CALCIUM   < >  --  8.5*   < >  --  7.9*  --  7.5*  --  7.1* 7.2*  --  8.2*  --   MG  --  2.2 2.2  --  1.8  --   --  2.3  --   --   --   --   --   --  PHOS  --  3.1 2.6  --  2.9  --   --  2.3*  --   --   --   --   --   --    < > = values in  this interval not displayed.   GFR: Estimated Creatinine Clearance: 73.8 mL/min (by C-G formula based on SCr of 0.55 mg/dL). Recent Labs  Lab 10/07/2019 2043 10/08/2019 2044 10/14/2019 2100 10/12/2019 2220 10/19/19 0500 10/20/19 0404 10/21/19 0409 10/21/19 1603 10/22/19 0400 10/23/19 0445  PROCALCITON  --   --  <0.10  --   --   --   --  <0.10  --   --   WBC  --    < >  --   --    < > 10.1 11.9*  --  10.4 9.9  LATICACIDVEN 1.5  --   --  0.9  --   --   --   --   --   --    < > = values in this interval not displayed.    Liver Function Tests: Recent Labs  Lab 10/19/2019 2044 10/19/19 0500 10/20/19 0404  AST 61* 42* 26  ALT 21 21 19   ALKPHOS 47 45 38  BILITOT 1.6* 0.4 0.6  PROT 6.3* 5.8* 5.5*  ALBUMIN 2.7* 1.9* 1.8*   No results for input(s): LIPASE, AMYLASE in the last 168 hours. No results for input(s): AMMONIA in the last 168 hours.  ABG    Component Value Date/Time   PHART 7.434 10/23/2019 0536   PCO2ART 41.3 10/23/2019 0536   PO2ART 72 (L) 10/23/2019 0536   HCO3 27.5 10/23/2019 0536   TCO2 29 10/23/2019 0536   ACIDBASEDEF 3.0 (H) 10/18/2019 1541   O2SAT 94.0 10/23/2019 0536     Coagulation Profile: Recent Labs  Lab 10/12/2019 2221  INR 1.2    Cardiac Enzymes: No results for input(s): CKTOTAL, CKMB, CKMBINDEX, TROPONINI in the last 168 hours.  HbA1C: Hgb A1c MFr Bld  Date/Time Value Ref Range Status  10/20/2019 04:04 AM 6.6 (H) 4.8 - 5.6 % Final    Comment:    (NOTE) Pre diabetes:          5.7%-6.4%  Diabetes:              >6.4%  Glycemic control for   <7.0% adults with diabetes   10/18/2019 03:53 PM 6.5 (H) 4.8 - 5.6 % Final    Comment:    (NOTE) Pre diabetes:          5.7%-6.4%  Diabetes:              >6.4%  Glycemic control for   <7.0% adults with diabetes     CBG: Recent Labs  Lab 10/22/19 1934 10/22/19 2329 10/23/19 0320 10/23/19 0731 10/23/19 1117  GLUCAP 189* 181* 183* 170* 199*     Critical care time: 55min    Katy  Whiteheart, NP Pulmonary/Critical Care Medicine  10/23/2019  11:59 AM   Pulmonary critical care attending:  This 69 year old female COVID-19 pneumonia intubated for mechanical life support. BP 139/63   Pulse 62   Temp 98.6 F (37 C)   Resp (!) 28   Ht 5\' 4"  (1.626 m)   Wt 96.4 kg   SpO2 91%   BMI 36.48 kg/m   General: Elderly female intubated on mechanical life support Heart: Regular rhythm S1-S2 Lungs: Biomechanical ventilated breath sounds Abdomen: Soft nontender nondistended  Labs: Reviewed Chest x-ray with bilateral airspace disease multifocal  pneumonia concerning for ARDS. The patient's images have been independently reviewed by me.    Assessment: COVID-19 pneumonia ARDS secondary to above Acute hypoxemic respiratory failure requiring mechanical ventilation AKI Hyperglycemia with type 2 diabetes Elevated D-dimer greater than 20  Plan: Continue to wean from mechanical support ARDS protocol Increasing O2 requirements. Patient requiring proning again today. Vascular duplex negative Continue remdesivir, steroids plus baricitinib Continue cefepime Full dose anticoagulation  This patient is critically ill with multiple organ system failure; which, requires frequent high complexity decision making, assessment, support, evaluation, and titration of therapies. This was completed through the application of advanced monitoring technologies and extensive interpretation of multiple databases. During this encounter critical care time was devoted to patient care services described in this note for 33 minutes.  [facility] 10/23/2019 6:24 PM

## 2019-10-24 ENCOUNTER — Inpatient Hospital Stay (HOSPITAL_COMMUNITY): Payer: Medicare Other

## 2019-10-24 DIAGNOSIS — I2602 Saddle embolus of pulmonary artery with acute cor pulmonale: Secondary | ICD-10-CM

## 2019-10-24 LAB — GLUCOSE, CAPILLARY
Glucose-Capillary: 206 mg/dL — ABNORMAL HIGH (ref 70–99)
Glucose-Capillary: 180 mg/dL — ABNORMAL HIGH (ref 70–99)
Glucose-Capillary: 196 mg/dL — ABNORMAL HIGH (ref 70–99)
Glucose-Capillary: 184 mg/dL — ABNORMAL HIGH (ref 70–99)
Glucose-Capillary: 200 mg/dL — ABNORMAL HIGH (ref 70–99)
Glucose-Capillary: 211 mg/dL — ABNORMAL HIGH (ref 70–99)

## 2019-10-24 LAB — BASIC METABOLIC PANEL
Anion gap: 11 (ref 5–15)
BUN: 25 mg/dL — ABNORMAL HIGH (ref 8–23)
CO2: 23 mmol/L (ref 22–32)
Calcium: 8.4 mg/dL — ABNORMAL LOW (ref 8.9–10.3)
Chloride: 103 mmol/L (ref 98–111)
Creatinine, Ser: 0.46 mg/dL (ref 0.44–1.00)
GFR calc Af Amer: >60 mL/min (ref >60)
GFR calc non Af Amer: >60 mL/min (ref >60)
Glucose, Bld: 198 mg/dL — ABNORMAL HIGH (ref 70–99)
Potassium: 4.4 mmol/L (ref 3.5–5.1)
Sodium: 137 mmol/L (ref 135–145)

## 2019-10-24 LAB — POCT I-STAT 7, (LYTES, BLD GAS, ICA,H+H)
Acid-Base Excess: 3 mmol/L — ABNORMAL HIGH (ref 0.0–2.0)
Bicarbonate: 26.3 mmol/L (ref 20.0–28.0)
Calcium, Ion: 1.27 mmol/L (ref 1.15–1.40)
HCT: 41 % (ref 36.0–46.0)
Hemoglobin: 13.9 g/dL (ref 12.0–15.0)
O2 Saturation: 98 %
Patient temperature: 36.1
Potassium: 4.4 mmol/L (ref 3.5–5.1)
Sodium: 140 mmol/L (ref 135–145)
TCO2: 27 mmol/L (ref 22–32)
pCO2 arterial: 34.1 mmHg (ref 32.0–48.0)
pH, Arterial: 7.491 — ABNORMAL HIGH (ref 7.350–7.450)
pO2, Arterial: 84 mmHg (ref 83.0–108.0)

## 2019-10-24 LAB — ECHOCARDIOGRAM LIMITED
Area-P 1/2: 2.42 cm2
Calc EF: 67 %
Height: 64 in
S' Lateral: 4 cm
Single Plane A2C EF: 72.2 %
Single Plane A4C EF: 60 %
Weight: 3640.24 oz

## 2019-10-24 LAB — TRIGLYCERIDES
Triglycerides: 984 mg/dL — ABNORMAL HIGH (ref <150)
Triglycerides: 247 mg/dL — ABNORMAL HIGH (ref <150)

## 2019-10-24 LAB — CBC
HCT: 32.4 % — ABNORMAL LOW (ref 36.0–46.0)
Hemoglobin: 10.8 g/dL — ABNORMAL LOW (ref 12.0–15.0)
MCH: 32.1 pg (ref 26.0–34.0)
MCHC: 33.3 g/dL (ref 30.0–36.0)
MCV: 96.4 fL (ref 80.0–100.0)
Platelets: 242 10*3/uL (ref 150–400)
RBC: 3.36 MIL/uL — ABNORMAL LOW (ref 3.87–5.11)
RDW: 14 % (ref 11.5–15.5)
WBC: 10.8 10*3/uL — ABNORMAL HIGH (ref 4.0–10.5)
nRBC: 0 % (ref 0.0–0.2)

## 2019-10-24 LAB — D-DIMER, QUANTITATIVE: D-Dimer, Quant: 15.03 ug/mL-FEU — ABNORMAL HIGH (ref 0.00–0.50)

## 2019-10-24 MED ORDER — INSULIN DETEMIR 100 UNIT/ML ~~LOC~~ SOLN
10.0000 [IU] | Freq: Two times a day (BID) | SUBCUTANEOUS | Status: DC
  Administered 2019-10-24 – 2019-11-09 (×32): 10 [IU] via SUBCUTANEOUS
  Filled 2019-10-24 (×37): qty 0.1

## 2019-10-24 MED ORDER — SODIUM CHLORIDE 0.9 % IV SOLN
1.0000 mg/kg/h | INTRAVENOUS | Status: DC
  Administered 2019-10-24 – 2019-10-25 (×5): 1 mg/kg/h via INTRAVENOUS
  Filled 2019-10-24 (×4): qty 5
  Filled 2019-10-24: qty 12.5
  Filled 2019-10-24 (×4): qty 5

## 2019-10-24 MED ORDER — CHLORHEXIDINE GLUCONATE 0.12 % MT SOLN
OROMUCOSAL | Status: AC
  Administered 2019-10-24: 15 mL via OROMUCOSAL
  Filled 2019-10-24: qty 15

## 2019-10-24 MED ORDER — ENOXAPARIN SODIUM 60 MG/0.6ML ~~LOC~~ SOLN
0.5000 mg/kg | SUBCUTANEOUS | Status: DC
  Administered 2019-10-24 – 2019-10-31 (×8): 47.5 mg via SUBCUTANEOUS
  Filled 2019-10-24 (×8): qty 0.6

## 2019-10-24 MED ORDER — ENOXAPARIN SODIUM 60 MG/0.6ML ~~LOC~~ SOLN: 0.5000 mg/kg | Freq: Two times a day (BID) | SUBCUTANEOUS | Status: DC

## 2019-10-24 NOTE — Progress Notes (Signed)
  Echocardiogram 2D Echocardiogram has been performed.  Jacqueline Lucas 10/24/2019, 10:24 AM

## 2019-10-24 NOTE — Progress Notes (Signed)
Pt proned at this time with no complications. Pt having moderate amount of blood coming from nose. Ett taped securely in proper position.

## 2019-10-24 NOTE — Progress Notes (Signed)
NAME:  Jacqueline Lucas, MRN:  [id number], DOB:  01-07-1951, LOS: 7 ADMISSION DATE:  10/13/2019, CONSULTATION DATE:  8/20 REFERRING MD:  EDP, CHIEF COMPLAINT:  Dyspnea   Brief History   69 y/o female admitted on 8/19 with severe acute respiratory failure with hypoxemia due to COVID 19 pneumonia.  Past Medical History   has a past medical history of Allergy, Allergy to adhesive tape, Anemia, Anginal pain ([place]), Anxiety, Arthritis, Asthma, Bronchitis, Cataract, Chronic nausea, Claustrophobia, DDD (degenerative disc disease), lumbar, Depression, Dry eyes, Fibromyalgia, GERD (gastroesophageal reflux disease), H/O bladder infections, Heart murmur, Hematuria, Hyperlipidemia, Hypertension, Hypothyroidism, IBS (irritable bowel syndrome), IDA (iron deficiency anemia) (05/13/2019), Insomnia, Lumbar neuritis, Migraines, Obesity, Osteoporosis, Palpitation, RLS (restless legs syndrome), Shoulder fracture, right, Sicca ([place]), Tachycardia, Thyroid disease, and Wears dentures.   Significant Hospital Events   8/20 Admit to PCCM 8/21 failed SBT, oxygenation worsened thorought the day  Consults:    Procedures:  8/20 ETT > 8/21  Significant Diagnostic Tests:  BLE venous dopplers 8/26>>> neg   Micro Data:  8/20 SARS COV 2>>positive 8/20 UC>> no growth 8/20 BCx2>> no growth to date 8/24 BCx2  Antimicrobials/COVID:  8/20 Remdesivir >  8/20 baricitinib >  8/20 solumedrol >   Interim history/subjective:   Remains critically ill. Intubated on life support.   Objective   Blood pressure 134/60, pulse (!) 59, temperature 98.8 F (37.1 C), resp. rate (!) 28, height 5\' 4"  (1.626 m), weight 103.2 kg, SpO2 90 %.    Vent Mode: PRVC FiO2 (%):  [70 %] 70 % Set Rate:  [28 bmp] 28 bmp Vt Set:  [440 mL] 440 mL PEEP:  [14 cmH20] 14 cmH20 Plateau Pressure:  [30 cmH20-32 cmH20] 30 cmH20   Intake/Output Summary (Last 24 hours) at 10/24/2019 1608 Last data filed at 10/24/2019 1400 Gross per 24 hour  Intake  1743.8 ml  Output 1550 ml  Net 193.8 ml   Filed Weights   10/22/19 0431 10/23/19 0500 10/24/19 0330  Weight: 96.4 kg 96.4 kg 103.2 kg    Examination: General: elderly fm, intuabted on life support HENT: ETT in place  Lungs: BL vented breaths  Cardiovascular: s1 s2 , RRR Abdomen: soft, nt nd  Extremities: no significant edema  Neuro: sedated, unresponsive to stimulus   Resolved Hospital Problem list     Assessment & Plan:  Acute respiratory failure requiring mechanical ventilation due to COVID-19 Suspecting HCAP AKI-resolved Hypertension Diabetes with hyperglycemia, A1c 6.6 Hypertension Hypothyroidism Depression  Plan:  Acute respiratory failure requiring mechanical ventilation COVID-19 pneumonia Underlying asthma Klebsiella HCAP, plus GNRs awaiting speciation  PLAN -  Continue mechanical ventilation per ARDS protocol Target TVol 6-8cc/kgIBW Target Plateau Pressure < 30cm H20 Target driving pressure less than 15 cm of water Target PaO2 55-65: titrate PEEP/FiO2 per protocol As long as PaO2 to FiO2 ratio is less than 1:150 position in prone position for 16 hours a day Check CVP daily if CVL in place Target CVP less than 4, diurese as necessary Ventilator associated pneumonia prevention protocol Continue covid treatments continued Cefepime for klebsiella and GNRs   Elevated Ddimer >20 BLE venous doppler neg  PLAN  VTE ppx covid dose   Hypotension  Shock, related to above  PLAN -  NEPI to maintain map >65 mmHg  Diabetes with hyperglycemia A1c of 6.6.  PLAN -  - CBG with SSI  - glycemic control protocol   Hypothyroidism synthroid   Depression Continue home effexor and divalproate  Hypertension Holding home antiHTN  Best practice:  Diet:tube feeding  Pain/Anxiety/Delirium protocol (if indicated): as above VAP protocol (if indicated): yes DVT prophylaxis: lovenox GI prophylaxis: Pantoprazole for stress ulcer prophylaxis Glucose  control: SSI and NovoLog Mobility: Bedrest Code Status: full Family Communication: we will update family  Disposition: remain in ICU for ventilator management  Labs   CBC: Recent Labs  Lab 10/02/2019 2044 10/18/19 0029 10/19/19 0500 10/20/19 0242 10/20/19 0404 10/20/19 0404 10/21/19 0409 10/21/19 0409 10/22/19 0400 10/22/19 0400 10/22/19 1100 10/23/19 0445 10/23/19 0536 10/24/19 0326 10/24/19 0434  WBC 9.3   < > 8.9  --  10.1  --  11.9*  --  10.4  --   --  9.9  --  10.8*  --   NEUTROABS 7.5  --  7.7  --  8.5*  --   --   --   --   --   --   --   --   --   --   HGB 12.8   < > 12.5   < > 11.7*   < > 12.0   < > 11.5*   < > 11.6* 10.2* 10.5* 10.8* 13.9  HCT 36.5   < > 36.9   < > 35.0*   < > 35.1*   < > 34.8*   < > 34.0* 31.7* 31.0* 32.4* 41.0  MCV 91.9   < > 90.9  --  93.3  --  95.4  --  97.2  --   --  97.2  --  96.4  --   PLT 217   < > 237  --  368  --  332  --  247  --   --  226  --  242  --    < > = values in this interval not displayed.    Basic Metabolic Panel: Recent Labs  Lab 10/18/19 0101 10/18/19 1553 10/19/19 0500 10/19/19 1905 10/19/19 2041 10/20/19 0404 10/20/19 0404 10/21/19 0409 10/21/19 0409 10/22/19 0400 10/22/19 0400 10/22/19 1100 10/23/19 0445 10/23/19 0536 10/24/19 0326 10/24/19 0434  NA   < >  --  138  --    < > 138   < > 136   < > 135   < > 141 139 141 137 140  K   < >  --  3.8  --    < > 4.2   < > 3.9   < > 3.8   < > 4.8 4.3 4.2 4.4 4.4  CL   < >  --  107  --    < > 106  --  103  --  103  --   --  105  --  103  --   CO2   < >  --  20*  --    < > 24  --  24  --  23  --   --  25  --  23  --   GLUCOSE   < >  --  243*  --    < > 216*  --  185*  --  157*  --   --  179*  --  198*  --   BUN   < >  --  37*  --    < > 37*  --  24*  --  25*  --   --  25*  --  25*  --   CREATININE   < >  --  1.12*  --    < >  0.85  --  0.51  --  0.47  --   --  0.55  --  0.46  --   CALCIUM   < >  --  8.5*  --    < > 7.5*  --  7.1*  --  7.2*  --   --  8.2*  --  8.4*  --     MG  --  2.2 2.2 1.8  --  2.3  --   --   --   --   --   --   --   --   --   --   PHOS  --  3.1 2.6 2.9  --  2.3*  --   --   --   --   --   --   --   --   --   --    < > = values in this interval not displayed.   GFR: Estimated Creatinine Clearance: 76.5 mL/min (by C-G formula based on SCr of 0.46 mg/dL). Recent Labs  Lab 10/23/2019 2043 10/05/2019 2044 10/16/2019 2100 10/23/2019 2220 10/19/19 0500 10/21/19 0409 10/21/19 1603 10/22/19 0400 10/23/19 0445 10/24/19 0326  PROCALCITON  --   --  <0.10  --   --   --  <0.10  --   --   --   WBC  --    < >  --   --    < > 11.9*  --  10.4 9.9 10.8*  LATICACIDVEN 1.5  --   --  0.9  --   --   --   --   --   --    < > = values in this interval not displayed.    Liver Function Tests: Recent Labs  Lab 10/19/2019 2044 10/19/19 0500 10/20/19 0404  AST 61* 42* 26  ALT 21 21 19   ALKPHOS 47 45 38  BILITOT 1.6* 0.4 0.6  PROT 6.3* 5.8* 5.5*  ALBUMIN 2.7* 1.9* 1.8*   No results for input(s): LIPASE, AMYLASE in the last 168 hours. No results for input(s): AMMONIA in the last 168 hours.  ABG    Component Value Date/Time   PHART 7.491 (H) 10/24/2019 0434   PCO2ART 34.1 10/24/2019 0434   PO2ART 84 10/24/2019 0434   HCO3 26.3 10/24/2019 0434   TCO2 27 10/24/2019 0434   ACIDBASEDEF 3.0 (H) 10/18/2019 1541   O2SAT 98.0 10/24/2019 0434     Coagulation Profile: Recent Labs  Lab 10/02/2019 2221  INR 1.2    Cardiac Enzymes: No results for input(s): CKTOTAL, CKMB, CKMBINDEX, TROPONINI in the last 168 hours.  HbA1C: Hgb A1c MFr Bld  Date/Time Value Ref Range Status  10/20/2019 04:04 AM 6.6 (H) 4.8 - 5.6 % Final    Comment:    (NOTE) Pre diabetes:          5.7%-6.4%  Diabetes:              >6.4%  Glycemic control for   <7.0% adults with diabetes   10/18/2019 03:53 PM 6.5 (H) 4.8 - 5.6 % Final    Comment:    (NOTE) Pre diabetes:          5.7%-6.4%  Diabetes:              >6.4%  Glycemic control for   <7.0% adults with diabetes      CBG: Recent Labs  Lab 10/23/19 2322 10/24/19 0406 10/24/19 0721 10/24/19 1201 10/24/19 1546  GLUCAP 204* 206* 180*  196* 184*     This patient is critically ill with multiple organ system failure; which, requires frequent high complexity decision making, assessment, support, evaluation, and titration of therapies. This was completed through the application of advanced monitoring technologies and extensive interpretation of multiple databases. During this encounter critical care time was devoted to patient care services described in this note for 33 minutes.  [facility] 10/24/2019 4:08 PM

## 2019-10-24 NOTE — Progress Notes (Signed)
Nutrition Follow-up  DOCUMENTATION CODES:   Obesity unspecified  INTERVENTION:   If pt is tolerating TF at goal rate, TF should NOT be decreased when pt placed in prone position  Tube Feeding via OG: Vital High Protein at 60 mL/hr (1440 mL goal daily volume)  PROSource TF 45 mL BID  Goal regimen provides 1520 kcal, 148 grams of protein, 1209 mL H2O daily.  TF regimen and propofol at current rate providing 2103 total kcal/day   NUTRITION DIAGNOSIS:   Inadequate oral intake related to inability to eat as evidenced by NPO status.  Being addressed via TF    GOAL:   Patient will meet greater than or equal to 90% of their needs  Progressing  MONITOR:   Vent status, Labs, Weight trends, TF tolerance, I & O's  REASON FOR ASSESSMENT:   Consult Assessment of nutrition requirement/status, Enteral/tube feeding initiation and management  ASSESSMENT:   69 year old female with PMHx of HTN, asthma, HLD, anxiety, arthritis, depression, GERD, migraines, chronic nausea, fibromyalgia, IBS, hypothyroidism, osteoporosis, iron deficiency anemia who was diagnosed with COVID-19 on 8/19 and now admitted with respiratory failure.  8/20 Intubated  Pt remains on vent support; on prone protocol. Off levophed, sedated on propofol, fentanyl and precedex.   Propofol: 22.1 ml/hr  Vital High Protein at 60 ml/hr, Pro-Source TF 90 mL daily, free water 100 mL q 8 hours. AppearsTF turned down to 20 ml/hr yesterday while prone, unsure as to why this was done as TF can continue at goal rate when pt placed in prone position  +BM via rectal tube  No skin breakdown per RN assessment  Current weight 103.2 kg; admit weight 94.3 kg. Net+5 L  Labs: reviewed; CBGs 180-224, sodium 140 (wdl), Creatinine wdl Meds: ss novolog, novolog q 4 hours, levemir BID, solumedrol  Diet Order:   Diet Order    None      EDUCATION NEEDS:   No education needs have been identified at this time  Skin:  Skin  Assessment: Reviewed RN Assessment  Last BM:  8/27  Height:   Ht Readings from Last 1 Encounters:  10/22/19 5\' 4"  (1.626 m)    Weight:   Wt Readings from Last 1 Encounters:  10/24/19 103.2 kg    Ideal Body Weight:   (IBW: 55 kg, Adjusted BW: 70 kg)  BMI:  Body mass index is 39.05 kg/m.  Estimated Nutritional Needs:   Kcal:  1880-2030 kcals  Protein:  140-150 grams  Fluid:  >/= 2 L/day   Kerman Passey MS, RDN, LDN, CNSC Registered Dietitian III Clinical Nutrition RD Pager and On-Call Pager Number Located in [place]

## 2019-10-24 NOTE — Progress Notes (Signed)
Patients head turned to right side without any complications. RT will continue to monitor.  

## 2019-10-24 NOTE — Progress Notes (Signed)
Pts head turned to left and arms rotated. Small amounts of blood coming from nose. ETT secure throughout.

## 2019-10-24 NOTE — Progress Notes (Addendum)
Pt supined without any complications. Removed the cloth tape and foam pads from cheeks and upper lips. Checked for breakdown, no breakdown noted. ETT tube secure with commercial tube holder. Vital signs are stable at this moment. RT will continue to monitor.

## 2019-10-25 DIAGNOSIS — J1282 Pneumonia due to coronavirus disease 2019: Secondary | ICD-10-CM

## 2019-10-25 DIAGNOSIS — J9601 Acute respiratory failure with hypoxia: Secondary | ICD-10-CM

## 2019-10-25 LAB — GLUCOSE, CAPILLARY
Glucose-Capillary: 203 mg/dL — ABNORMAL HIGH (ref 70–99)
Glucose-Capillary: 173 mg/dL — ABNORMAL HIGH (ref 70–99)
Glucose-Capillary: 135 mg/dL — ABNORMAL HIGH (ref 70–99)
Glucose-Capillary: 136 mg/dL — ABNORMAL HIGH (ref 70–99)
Glucose-Capillary: 149 mg/dL — ABNORMAL HIGH (ref 70–99)
Glucose-Capillary: 132 mg/dL — ABNORMAL HIGH (ref 70–99)

## 2019-10-25 LAB — POCT I-STAT 7, (LYTES, BLD GAS, ICA,H+H)
Acid-Base Excess: 1 mmol/L (ref 0.0–2.0)
Acid-base deficit: 1 mmol/L (ref 0.0–2.0)
Bicarbonate: 24.6 mmol/L (ref 20.0–28.0)
Bicarbonate: 23.1 mmol/L (ref 20.0–28.0)
Calcium, Ion: 1.32 mmol/L (ref 1.15–1.40)
Calcium, Ion: 1.37 mmol/L (ref 1.15–1.40)
HCT: 31 % — ABNORMAL LOW (ref 36.0–46.0)
HCT: 31 % — ABNORMAL LOW (ref 36.0–46.0)
Hemoglobin: 10.5 g/dL — ABNORMAL LOW (ref 12.0–15.0)
Hemoglobin: 10.5 g/dL — ABNORMAL LOW (ref 12.0–15.0)
O2 Saturation: 97 %
O2 Saturation: 94 %
Patient temperature: 97.5
Patient temperature: 36.8
Potassium: 4.6 mmol/L (ref 3.5–5.1)
Potassium: 3.9 mmol/L (ref 3.5–5.1)
Sodium: 142 mmol/L (ref 135–145)
Sodium: 143 mmol/L (ref 135–145)
TCO2: 26 mmol/L (ref 22–32)
TCO2: 24 mmol/L (ref 22–32)
pCO2 arterial: 35.5 mmHg (ref 32.0–48.0)
pCO2 arterial: 35.3 mmHg (ref 32.0–48.0)
pH, Arterial: 7.446 (ref 7.350–7.450)
pH, Arterial: 7.423 (ref 7.350–7.450)
pO2, Arterial: 81 mmHg — ABNORMAL LOW (ref 83.0–108.0)
pO2, Arterial: 68 mmHg — ABNORMAL LOW (ref 83.0–108.0)

## 2019-10-25 LAB — BASIC METABOLIC PANEL
Anion gap: 9 (ref 5–15)
BUN: 27 mg/dL — ABNORMAL HIGH (ref 8–23)
CO2: 23 mmol/L (ref 22–32)
Calcium: 8.7 mg/dL — ABNORMAL LOW (ref 8.9–10.3)
Chloride: 107 mmol/L (ref 98–111)
Creatinine, Ser: 0.54 mg/dL (ref 0.44–1.00)
GFR calc Af Amer: >60 mL/min (ref >60)
GFR calc non Af Amer: >60 mL/min (ref >60)
Glucose, Bld: 201 mg/dL — ABNORMAL HIGH (ref 70–99)
Potassium: 4.3 mmol/L (ref 3.5–5.1)
Sodium: 139 mmol/L (ref 135–145)

## 2019-10-25 LAB — CBC
HCT: 32.9 % — ABNORMAL LOW (ref 36.0–46.0)
Hemoglobin: 10.8 g/dL — ABNORMAL LOW (ref 12.0–15.0)
MCH: 31.9 pg (ref 26.0–34.0)
MCHC: 32.8 g/dL (ref 30.0–36.0)
MCV: 97.1 fL (ref 80.0–100.0)
Platelets: 275 10*3/uL (ref 150–400)
RBC: 3.39 MIL/uL — ABNORMAL LOW (ref 3.87–5.11)
RDW: 14.4 % (ref 11.5–15.5)
WBC: 11 10*3/uL — ABNORMAL HIGH (ref 4.0–10.5)
nRBC: 0 % (ref 0.0–0.2)

## 2019-10-25 LAB — CULTURE, RESPIRATORY W GRAM STAIN

## 2019-10-25 LAB — D-DIMER, QUANTITATIVE: D-Dimer, Quant: 3.59 ug/mL-FEU — ABNORMAL HIGH (ref 0.00–0.50)

## 2019-10-25 NOTE — Progress Notes (Signed)
Patients head turned to left side without any complications. Still small amount of blood is coming out of nose. RN is aware. RT will continue to monitor.

## 2019-10-25 NOTE — Progress Notes (Signed)
Pt supined at this time with no complications. Et secured with commercial tube holder in proper position. Bite block remains in place. Moderate amount of dried blood in nose. MD aware

## 2019-10-25 NOTE — Progress Notes (Signed)
Pts head turned to right side without any complications. Small amount of blood is coming out of her nose. RT will continue to monitor.

## 2019-10-25 NOTE — Progress Notes (Signed)
NAME:  Jacqueline Lucas, MRN:  [id number], DOB:  Aug 13, 1950, LOS: 8 ADMISSION DATE:  10/16/2019, CONSULTATION DATE:  8/20 REFERRING MD:  EDP, CHIEF COMPLAINT:  Dyspnea   Brief History   69 y/o female admitted on 8/19 with severe acute respiratory failure with hypoxemia due to COVID 19 pneumonia.  Past Medical History   has a past medical history of Allergy, Allergy to adhesive tape, Anemia, Anginal pain ([place]), Anxiety, Arthritis, Asthma, Bronchitis, Cataract, Chronic nausea, Claustrophobia, DDD (degenerative disc disease), lumbar, Depression, Dry eyes, Fibromyalgia, GERD (gastroesophageal reflux disease), H/O bladder infections, Heart murmur, Hematuria, Hyperlipidemia, Hypertension, Hypothyroidism, IBS (irritable bowel syndrome), IDA (iron deficiency anemia) (05/13/2019), Insomnia, Lumbar neuritis, Migraines, Obesity, Osteoporosis, Palpitation, RLS (restless legs syndrome), Shoulder fracture, right, Sicca ([place]), Tachycardia, Thyroid disease, and Wears dentures.   Significant Hospital Events   8/20 Admit to PCCM 8/21 failed SBT, oxygenation worsened thorought the day  Consults:    Procedures:  8/20 ETT > 8/21  Significant Diagnostic Tests:  BLE venous dopplers 8/26>>> neg   Micro Data:  8/20 SARS COV 2>>positive 8/20 UC>> no growth 8/20 BCx2>> no growth to date 8/24 BCx2 >> no growth up-to-date 8/24 respiratory culture grew Klebsiella Antimicrobials/COVID:  8/20 Remdesivir >  8/20 baricitinib >  8/20 solumedrol >  8/24 cefepime  Interim history/subjective:  No acute event overnight  Patient is seen at bedside.  Sedated and intubated.  Plan to prone her again this afternoon  I called and spoke to her son, Jacqueline Lucas.  I explained to him her current respiratory status and proning her this afternoon.  All questions were answered.  Objective   Blood pressure (!) 118/52, pulse (!) 54, temperature 97.7 F (36.5 C), resp. rate (!) 25, height 5\' 4"  (1.626 m), weight 101.5 kg, SpO2 93  %.    Vent Mode: PRVC FiO2 (%):  [60 %-70 %] 60 % Set Rate:  [28 bmp] 28 bmp Vt Set:  [440 mL] 440 mL PEEP:  [14 cmH20] 14 cmH20 Plateau Pressure:  [29 cmH20-30 cmH20] 30 cmH20   Intake/Output Summary (Last 24 hours) at 10/25/2019 1410 Last data filed at 10/25/2019 1000 Gross per 24 hour  Intake 2149.56 ml  Output 1700 ml  Net 449.56 ml   Filed Weights   10/23/19 0500 10/24/19 0330 10/25/19 0330  Weight: 96.4 kg 103.2 kg 101.5 kg    Physical Exam Constitutional:      Appearance: She is ill-appearing.  HENT:     Head: Normocephalic.  Cardiovascular:     Rate and Rhythm: Normal rate and regular rhythm.     Heart sounds: No murmur heard.   Pulmonary:     Comments: Diminished breath sounds bilaterally Abdominal:     General: Bowel sounds are normal. There is no distension.     Palpations: Abdomen is soft.  Musculoskeletal:     Right lower leg: No edema.     Left lower leg: No edema.  Skin:    General: Skin is warm.  Neurological:     Comments: Sedated and minimally responsive to stimulations     Resolved Hospital Problem list     Assessment & Plan:  Acute respiratory failure requiring mechanical ventilation due to COVID-19 HCAP AKI-resolved Hypertension Diabetes with hyperglycemia, A1c 6.6 Hypertension Hypothyroidism Depression  Plan:  Acute respiratory failure requiring mechanical ventilation COVID-19 pneumonia Underlying asthma Klebsiella HCAP, plus GNRs awaiting speciation  Continue mechanical ventilation per ARDS protocol Target TVol 6-8cc/kgIBW Target Plateau Pressure < 30cm H20 Target driving pressure less than  15 cm of water Target PaO2 55-65: titrate PEEP/FiO2 per protocol As long as PaO2 to FiO2 ratio is less than 1:150 position in prone position for 16 hours a day Check CVP daily if CVL in place Target CVP less than 4, diurese as necessary Ventilator associated pneumonia prevention protocol Continue covid treatments  Cefepime for  klebsiella and GNRs (day 5/7) 8/28 try to stop ketamine.  Continue proning this afternoon. PRVC 28/440/14/60 FiO2 ABG 7.423/CO2 35.3/O2 68/bicarb 23   Elevated Ddimer >20 BLE venous doppler neg. current D-dimer 3.5 -Lovenox for DVT prophylactic   Diabetes with hyperglycemia A1c of 6.6.  CBG goal less than 180 -Levemir 10 units twice daily -NovoLog 10 units every 4 hours -Sliding scale -Monitor CBG   Hypothyroidism synthroid    Depression Continue home effexor and divalproate   Hypertension Holding home antiHTN   Best practice:  Diet: tube feeding  Pain/Anxiety/Delirium protocol (if indicated): as above VAP protocol (if indicated): yes DVT prophylaxis: lovenox GI prophylaxis: Pantoprazole for stress ulcer prophylaxis Glucose control: SSI and NovoLog and Levemir Mobility: Bedrest Code Status: full Family Communication: Updated son Disposition: remain in ICU for ventilator management  Critical care time: 25 minutes  [name], DO Internal Medicine Residency My pager: [phone]

## 2019-10-26 ENCOUNTER — Inpatient Hospital Stay (HOSPITAL_COMMUNITY): Payer: Medicare Other

## 2019-10-26 LAB — CBC
HCT: 35.1 % — ABNORMAL LOW (ref 36.0–46.0)
Hemoglobin: 11.3 g/dL — ABNORMAL LOW (ref 12.0–15.0)
MCH: 30.6 pg (ref 26.0–34.0)
MCHC: 32.2 g/dL (ref 30.0–36.0)
MCV: 95.1 fL (ref 80.0–100.0)
Platelets: 269 10*3/uL (ref 150–400)
RBC: 3.69 MIL/uL — ABNORMAL LOW (ref 3.87–5.11)
RDW: 14.1 % (ref 11.5–15.5)
WBC: 13.4 10*3/uL — ABNORMAL HIGH (ref 4.0–10.5)
nRBC: 0 % (ref 0.0–0.2)

## 2019-10-26 LAB — POCT I-STAT 7, (LYTES, BLD GAS, ICA,H+H)
Acid-Base Excess: 0 mmol/L (ref 0.0–2.0)
Bicarbonate: 24.9 mmol/L (ref 20.0–28.0)
Calcium, Ion: 1.34 mmol/L (ref 1.15–1.40)
HCT: 33 % — ABNORMAL LOW (ref 36.0–46.0)
Hemoglobin: 11.2 g/dL — ABNORMAL LOW (ref 12.0–15.0)
O2 Saturation: 96 %
Potassium: 4.3 mmol/L (ref 3.5–5.1)
Sodium: 140 mmol/L (ref 135–145)
TCO2: 26 mmol/L (ref 22–32)
pCO2 arterial: 38.5 mmHg (ref 32.0–48.0)
pH, Arterial: 7.419 (ref 7.350–7.450)
pO2, Arterial: 77 mmHg — ABNORMAL LOW (ref 83.0–108.0)

## 2019-10-26 LAB — CULTURE, BLOOD (ROUTINE X 2)
Culture: NO GROWTH
Culture: NO GROWTH
Special Requests: ADEQUATE
Special Requests: ADEQUATE

## 2019-10-26 LAB — BASIC METABOLIC PANEL
Anion gap: 9 (ref 5–15)
BUN: 27 mg/dL — ABNORMAL HIGH (ref 8–23)
CO2: 24 mmol/L (ref 22–32)
Calcium: 8.8 mg/dL — ABNORMAL LOW (ref 8.9–10.3)
Chloride: 106 mmol/L (ref 98–111)
Creatinine, Ser: 0.5 mg/dL (ref 0.44–1.00)
GFR calc Af Amer: >60 mL/min (ref >60)
GFR calc non Af Amer: >60 mL/min (ref >60)
Glucose, Bld: 131 mg/dL — ABNORMAL HIGH (ref 70–99)
Potassium: 3.9 mmol/L (ref 3.5–5.1)
Sodium: 139 mmol/L (ref 135–145)

## 2019-10-26 LAB — GLUCOSE, CAPILLARY
Glucose-Capillary: 101 mg/dL — ABNORMAL HIGH (ref 70–99)
Glucose-Capillary: 108 mg/dL — ABNORMAL HIGH (ref 70–99)
Glucose-Capillary: 109 mg/dL — ABNORMAL HIGH (ref 70–99)
Glucose-Capillary: 115 mg/dL — ABNORMAL HIGH (ref 70–99)
Glucose-Capillary: 126 mg/dL — ABNORMAL HIGH (ref 70–99)
Glucose-Capillary: 139 mg/dL — ABNORMAL HIGH (ref 70–99)

## 2019-10-26 LAB — TROPONIN I (HIGH SENSITIVITY)
Troponin I (High Sensitivity): 11 ng/L (ref <18)
Troponin I (High Sensitivity): 10 ng/L (ref <18)

## 2019-10-26 MED ORDER — MIDAZOLAM 50MG/50ML (1MG/ML) PREMIX INFUSION
0.0000 mg/h | INTRAVENOUS | Status: DC
  Administered 2019-10-26: 2 mg/h via INTRAVENOUS
  Administered 2019-10-27 – 2019-10-28 (×4): 4 mg/h via INTRAVENOUS
  Filled 2019-10-26 (×5): qty 50

## 2019-10-26 MED ORDER — DOCUSATE SODIUM 50 MG/5ML PO LIQD: 100.0000 mg | Freq: Two times a day (BID) | ORAL | Status: DC

## 2019-10-26 MED ORDER — MIDAZOLAM HCL 2 MG/2ML IJ SOLN: 1.0000 mg | INTRAMUSCULAR | Status: DC | PRN

## 2019-10-26 MED ORDER — OXYMETAZOLINE HCL 0.05 % NA SOLN
1.0000 | Freq: Two times a day (BID) | NASAL | Status: AC
  Administered 2019-10-26 – 2019-10-29 (×8): 1 via NASAL
  Filled 2019-10-26: qty 30

## 2019-10-26 MED ORDER — POLYETHYLENE GLYCOL 3350 17 G PO PACK: 17.0000 g | PACK | Freq: Every day | ORAL | Status: DC

## 2019-10-26 MED ORDER — MIDAZOLAM BOLUS VIA INFUSION
1.0000 mg | INTRAVENOUS | Status: DC | PRN
  Administered 2019-10-27 – 2019-10-28 (×3): 2 mg via INTRAVENOUS
  Filled 2019-10-26: qty 2

## 2019-10-26 NOTE — Progress Notes (Signed)
Pt supined at this time with no complications. Ett secured with commercial tube holder.

## 2019-10-26 NOTE — Progress Notes (Signed)
NAME:  Jacqueline Lucas, MRN:  [id number], DOB:  Jan 07, 1951, LOS: 9 ADMISSION DATE:  10/03/2019, CONSULTATION DATE:  8/20 REFERRING MD:  EDP, CHIEF COMPLAINT:  Dyspnea   Brief History   69 y/o female admitted on 8/19 with severe acute respiratory failure with hypoxemia due to COVID 19 pneumonia.  Past Medical History   has a past medical history of Allergy, Allergy to adhesive tape, Anemia, Anginal pain ([place]), Anxiety, Arthritis, Asthma, Bronchitis, Cataract, Chronic nausea, Claustrophobia, DDD (degenerative disc disease), lumbar, Depression, Dry eyes, Fibromyalgia, GERD (gastroesophageal reflux disease), H/O bladder infections, Heart murmur, Hematuria, Hyperlipidemia, Hypertension, Hypothyroidism, IBS (irritable bowel syndrome), IDA (iron deficiency anemia) (05/13/2019), Insomnia, Lumbar neuritis, Migraines, Obesity, Osteoporosis, Palpitation, RLS (restless legs syndrome), Shoulder fracture, right, Sicca ([place]), Tachycardia, Thyroid disease, and Wears dentures.   Significant Hospital Events   8/20 Admit to PCCM 8/21 failed SBT, oxygenation worsened thorought the day  Consults:    Procedures:  8/20 ETT > 8/21  Significant Diagnostic Tests:  BLE venous dopplers 8/26>>> neg   Micro Data:  8/20 SARS COV 2>>positive 8/20 UC>> no growth 8/20 BCx2>> no growth to date 8/24 BCx2 >> no growth up-to-date 8/24 respiratory culture grew Klebsiella Antimicrobials/COVID:  8/20 Remdesivir >  8/20 baricitinib >  8/20 solumedrol >  8/24 cefepime  Interim history/subjective:  Patient had an episode of brief asystole and bradycardia in the 40s.  Given persistent bradycardia, 1 mg atropine was given and heart rate improved to the 80s and 90s.  Patient was hemodynamically stable after.  Patient is seen at bedside.  Patient is proned, sedated and intubated.  Worse lungs sound today compared to yesterday.  I called and spoke to patient's son, Kasandra Knudsen.  I explained to him the patient's current  condition and respiratory status.  He voiced understanding.  All questions were answered.  Objective   Blood pressure (!) 121/58, pulse (!) 52, temperature (!) 97 F (36.1 C), resp. rate (!) 28, height 5\' 4"  (1.626 m), weight 99.3 kg, SpO2 96 %.    Vent Mode: PRVC FiO2 (%):  [60 %] 60 % Set Rate:  [28 bmp] 28 bmp Vt Set:  [440 mL] 440 mL PEEP:  [14 cmH20-143 cmH20] 143 cmH20 Plateau Pressure:  [29 cmH20-30 cmH20] 29 cmH20   Intake/Output Summary (Last 24 hours) at 10/26/2019 0824 Last data filed at 10/26/2019 0600 Gross per 24 hour  Intake 2635.53 ml  Output 2280 ml  Net 355.53 ml   Filed Weights   10/24/19 0330 10/25/19 0330 10/26/19 0500  Weight: 103.2 kg 101.5 kg 99.3 kg    Physical Exam Constitutional:      Appearance: She is ill-appearing.  HENT:     Head: Normocephalic.     Nose:     Comments: Dark clotted blood noted at nostrils.    Mouth/Throat:     Comments: Intubated.  Dark red mucus suctioned of mouth and ET tube Cardiovascular:     Rate and Rhythm: Normal rate and regular rhythm.     Heart sounds: No murmur heard.   Pulmonary:     Comments: Worsening rhonchi Abdominal:     General: Bowel sounds are normal. There is no distension.     Palpations: Abdomen is soft.  Musculoskeletal:     Right lower leg: No edema.     Left lower leg: No edema.  Skin:    General: Skin is warm.  Neurological:     Comments: Sedated and minimally responsive to stimulations     Resolved  Hospital Problem list     Assessment & Plan:  Acute respiratory failure requiring mechanical ventilation due to COVID-19 HCAP AKI-resolved Hypertension Diabetes with hyperglycemia, A1c 6.6 Hypertension Hypothyroidism Depression  Plan:  Acute respiratory failure requiring mechanical ventilation COVID-19 pneumonia Underlying asthma Klebsiella HCAP, plus GNRs awaiting speciation  Continue mechanical ventilation per ARDS protocol Target TVol 6-8cc/kgIBW Target Plateau Pressure <  30cm H20 Target driving pressure less than 15 cm of water Target PaO2 55-65: titrate PEEP/FiO2 per protocol As long as PaO2 to FiO2 ratio is less than 1:150 position in prone position for 16 hours a day Check CVP daily if CVL in place Target CVP less than 4, diurese as necessary Ventilator associated pneumonia prevention protocol Continue covid treatments  Cefepime for klebsiella and GNRs (day 5/7) 8/29 patient was proned last night, recheck ABG after supine.  She still requires 70% of FiO2 and PEEP of 14. -Continue full ventilator support -Sedation with fentanyl and propofol.  DC Precedex and at Versed   Asystole Bradycardia Patient had an episode of brief asystole and bradycardia in the 40s which improved after 1 mg of atropine. This is likely secondary to blood and mucous clot in the ET tube and her airway from her nosebleed.  Unlikely to be cardiac related given normal echo on 8/27.  Will check cardiac marker    -Continue suction -Check EKG and troponin -DC Precedex given its effect on heart rate and blood pressure.  Add Versed   Elevated Ddimer >20 BLE venous doppler neg. current D-dimer 3.5 -Lovenox for DVT prophylactic   Diabetes with hyperglycemia A1c of 6.6.  CBG goal less than 180 -Levemir 10 units twice daily -NovoLog 10 units every 4 hours -Sliding scale -Monitor CBG   Hypothyroidism synthroid    Depression Continue home effexor and divalproate   Hypertension Holding home antiHTN   Best practice:  Diet: tube feeding  Pain/Anxiety/Delirium protocol (if indicated): as above VAP protocol (if indicated): yes DVT prophylaxis: lovenox GI prophylaxis: Pantoprazole for stress ulcer prophylaxis Glucose control: SSI and NovoLog and Levemir Mobility: Bedrest Code Status: full Family Communication: Updated son Disposition: remain in ICU for ventilator management  Critical care time: 25 minutes  [name], DO Internal Medicine Residency My pager:  [phone]

## 2019-10-26 NOTE — Significant Event (Signed)
I was notified for a brief episode of asystole and heart rates came back in the 40s.  Blood pressure was stable at 112/70.  Patient had a pulse throughout the entire encounter.  Given persistent bradycardia, 1 mg of atropine was given.  Heart rate improved to the 90s and blood pressure stable at 150/110.  Suctioning of ET tube and mouth showed dark red clot and mucus, likely secondary to from her old nosebleed.  Patient is currently hemodynamically stable and SPO2 in the 90s on current vent setting.  EKG was obtained.  Continue to monitor closely.   [name], DO Internal Medicine Residency My pager: [phone]

## 2019-10-26 NOTE — Progress Notes (Signed)
200 ml of ketamine wasted in the stericycle and witnessed by Thurmond Butts, Therapist, sports.

## 2019-10-27 LAB — CBC
HCT: 34.2 % — ABNORMAL LOW (ref 36.0–46.0)
Hemoglobin: 11.1 g/dL — ABNORMAL LOW (ref 12.0–15.0)
MCH: 31.3 pg (ref 26.0–34.0)
MCHC: 32.5 g/dL (ref 30.0–36.0)
MCV: 96.3 fL (ref 80.0–100.0)
Platelets: 303 10*3/uL (ref 150–400)
RBC: 3.55 MIL/uL — ABNORMAL LOW (ref 3.87–5.11)
RDW: 14.3 % (ref 11.5–15.5)
WBC: 17 10*3/uL — ABNORMAL HIGH (ref 4.0–10.5)
nRBC: 0 % (ref 0.0–0.2)

## 2019-10-27 LAB — POCT I-STAT 7, (LYTES, BLD GAS, ICA,H+H)
Acid-Base Excess: 3 mmol/L — ABNORMAL HIGH (ref 0.0–2.0)
Acid-Base Excess: 4 mmol/L — ABNORMAL HIGH (ref 0.0–2.0)
Bicarbonate: 27.3 mmol/L (ref 20.0–28.0)
Bicarbonate: 27.4 mmol/L (ref 20.0–28.0)
Calcium, Ion: 1.32 mmol/L (ref 1.15–1.40)
Calcium, Ion: 1.3 mmol/L (ref 1.15–1.40)
HCT: 35 % — ABNORMAL LOW (ref 36.0–46.0)
HCT: 31 % — ABNORMAL LOW (ref 36.0–46.0)
Hemoglobin: 11.9 g/dL — ABNORMAL LOW (ref 12.0–15.0)
Hemoglobin: 10.5 g/dL — ABNORMAL LOW (ref 12.0–15.0)
O2 Saturation: 95 %
O2 Saturation: 97 %
Patient temperature: 36.6
Potassium: 3.7 mmol/L (ref 3.5–5.1)
Potassium: 3.9 mmol/L (ref 3.5–5.1)
Sodium: 138 mmol/L (ref 135–145)
Sodium: 139 mmol/L (ref 135–145)
TCO2: 28 mmol/L (ref 22–32)
TCO2: 29 mmol/L (ref 22–32)
pCO2 arterial: 38.3 mmHg (ref 32.0–48.0)
pCO2 arterial: 36.1 mmHg (ref 32.0–48.0)
pH, Arterial: 7.462 — ABNORMAL HIGH (ref 7.350–7.450)
pH, Arterial: 7.488 — ABNORMAL HIGH (ref 7.350–7.450)
pO2, Arterial: 70 mmHg — ABNORMAL LOW (ref 83.0–108.0)
pO2, Arterial: 80 mmHg — ABNORMAL LOW (ref 83.0–108.0)

## 2019-10-27 LAB — BASIC METABOLIC PANEL
Anion gap: 9 (ref 5–15)
BUN: 28 mg/dL — ABNORMAL HIGH (ref 8–23)
CO2: 25 mmol/L (ref 22–32)
Calcium: 9.1 mg/dL (ref 8.9–10.3)
Chloride: 107 mmol/L (ref 98–111)
Creatinine, Ser: 0.53 mg/dL (ref 0.44–1.00)
GFR calc Af Amer: >60 mL/min (ref >60)
GFR calc non Af Amer: >60 mL/min (ref >60)
Glucose, Bld: 83 mg/dL (ref 70–99)
Potassium: 4.1 mmol/L (ref 3.5–5.1)
Sodium: 141 mmol/L (ref 135–145)

## 2019-10-27 LAB — GLUCOSE, CAPILLARY
Glucose-Capillary: 83 mg/dL (ref 70–99)
Glucose-Capillary: 82 mg/dL (ref 70–99)
Glucose-Capillary: 111 mg/dL — ABNORMAL HIGH (ref 70–99)
Glucose-Capillary: 199 mg/dL — ABNORMAL HIGH (ref 70–99)
Glucose-Capillary: 159 mg/dL — ABNORMAL HIGH (ref 70–99)
Glucose-Capillary: 161 mg/dL — ABNORMAL HIGH (ref 70–99)

## 2019-10-27 LAB — TRIGLYCERIDES: Triglycerides: 139 mg/dL (ref <150)

## 2019-10-27 MED ORDER — FUROSEMIDE 10 MG/ML IJ SOLN
40.0000 mg | Freq: Once | INTRAMUSCULAR | Status: AC
  Administered 2019-10-27: 40 mg via INTRAVENOUS
  Filled 2019-10-27: qty 4

## 2019-10-27 NOTE — Progress Notes (Addendum)
bilateral foot drop noted, Prevalon boots ordered and applied

## 2019-10-27 NOTE — Progress Notes (Signed)
Pt re-proned at this time without complications. ETT secured with cloth tape. No breakdown noted on face when tube holder was removed. Pt with equal, bbs post proning. RT will continue to monitor.

## 2019-10-27 NOTE — Progress Notes (Signed)
Son, Kasandra Knudsen updated by phone.  All questions answered.    Kennieth Rad, MSN, AGACNP-BC [facility] 10/27/2019, 2:48 PM  See Amion for personal pager PCCM on call pager [phone]

## 2019-10-27 NOTE — Progress Notes (Signed)
Pt supined at this time with no complications. Small amount of blood coming from patients mouth post supine when pt coughs. Skin appears in good condition. Ett secured in proper position with commercial tube holder

## 2019-10-27 NOTE — Progress Notes (Signed)
pr   NAME:  Jacqueline Lucas, MRN:  [id number], DOB:  1951-02-13, LOS: 74 ADMISSION DATE:  10/06/2019, CONSULTATION DATE:  8/20 REFERRING MD:  EDP, CHIEF COMPLAINT:  Dyspnea   Brief History   69 y/o female admitted on 8/19 with severe acute respiratory failure with hypoxemia due to COVID 19 pneumonia.  Past Medical History   has a past medical history of Allergy, Allergy to adhesive tape, Anemia, Anginal pain ([place]), Anxiety, Arthritis, Asthma, Bronchitis, Cataract, Chronic nausea, Claustrophobia, DDD (degenerative disc disease), lumbar, Depression, Dry eyes, Fibromyalgia, GERD (gastroesophageal reflux disease), H/O bladder infections, Heart murmur, Hematuria, Hyperlipidemia, Hypertension, Hypothyroidism, IBS (irritable bowel syndrome), IDA (iron deficiency anemia) (05/13/2019), Insomnia, Lumbar neuritis, Migraines, Obesity, Osteoporosis, Palpitation, RLS (restless legs syndrome), Shoulder fracture, right, Sicca ([place]), Tachycardia, Thyroid disease, and Wears dentures.   Significant Hospital Events   8/20 Admit to PCCM 8/21 failed SBT, oxygenation worsened thorought the day  Consults:    Procedures:  8/20 ETT >> 8/22 R PICC  >>  Significant Diagnostic Tests:  BLE venous dopplers 8/26>>> neg   Micro Data:  8/20 SARS COV 2>>positive 8/20 UC>> neg 8/20 BCx2>> neg 8/24 BCx2 >> neg 8/24 respiratory culture >> Klebsiella 8/30 BCx 2 >> 8/30 trach asp >>  Antimicrobials/COVID:  8/20 Remdesivir > 8/24 8/20 baricitinib >> 8/20 solumedrol > taper started 8/27 >> 8/24 cefepime >>8/31  Interim history/subjective:   RN reports that ever y time they turn her head while proned, she has bloody oral and nasal drainage Currently prone.  Improved oxygenation today peep 14, FiO2 60% Remains on fentanyl 100, versed 4, and propofol gtt 50 mcg/kg/min tmax 100.8, WBC 13.4-> 17 +1L/ net +7.4 L No further bradycardia spells  Objective   Blood pressure (!) 133/58, pulse 87, temperature (!)  100.8 F (38.2 C), resp. rate (!) 28, height 5\' 4"  (1.626 m), weight 101.5 kg, SpO2 96 %.    Vent Mode: PRVC FiO2 (%):  [60 %] 60 % Set Rate:  [28 bmp] 28 bmp Vt Set:  [440 mL] 440 mL PEEP:  [12 cmH20-14 cmH20] 12 cmH20 Plateau Pressure:  [22 cmH20-49 cmH20] 22 cmH20   Intake/Output Summary (Last 24 hours) at 10/27/2019 1127 Last data filed at 10/27/2019 1051 Gross per 24 hour  Intake 2225.35 ml  Output 2035 ml  Net 190.35 ml   Filed Weights   10/25/19 0330 10/26/19 0500 10/27/19 0500  Weight: 101.5 kg 99.3 kg 101.5 kg   General:  Critically ill, obese, older female proned and sedated on MV HEENT: MM pink/moist, no active bleeding from nares/ oral, ETT, pupils 4/reactive Neuro: sedated, non responsive CV: RR PULM:  MV supported breaths, coarse breath sounds bilaterally, diminished in bases, driving pressure 10 GI: +bs Extremities: warm/dry, trace generalized edema  Skin: no rashes  Resolved Hospital Problem list     Assessment & Plan:  Acute respiratory failure requiring mechanical ventilation due to COVID-19 HCAP AKI-resolved Hypertension Diabetes with hyperglycemia, A1c 6.6 Hypertension Hypothyroidism Depression  Plan:  Acute respiratory failure requiring mechanical ventilation COVID-19 pneumonia Underlying asthma Klebsiella HCAP Continue mechanical ventilation per ARDS protocol Target TVol 6-8cc/kgIBW, currently at 8 cc/kg Target Plateau Pressure < 30cm H20 Target driving pressure less than 15 cm of water Target PaO2 55-65: titrate PEEP/ FiO2 per protocol As long as PaO2 to FiO2 ratio is less than 1:150 position in prone position for 16 hours a day ABG now and after supine Trend CXR Ventilator associated pneumonia prevention protocol Trend inflammatory markers Continue solumedrol taper and baricitinib  per pharmacy  PAD protocol with propofol, fentanyl and versed gtts with bowel regimen  Additional lasix 40 meq today   Asystole Bradycardia Patient had  an episode of brief asystole and bradycardia in the 40s which improved after 1 mg of atropine 8/29 - no further episodes.  Troponin normal.  Normal TTE 8/27.  Unclear if related to precedex (since stopped) or blood/ mucous clot in the ET tube - continue tele monitoring  Elevated Ddimer >20 BLE venous doppler neg. current D-dimer 3.5 -Lovenox for DVT prophylactic  Diabetes with hyperglycemia - A1c of 6.6.   - Glucose at goal < 180 - continue SSI resistant - levemir 10 units BID  Epistaxis - no current bleeding - prn Afrin   Leukocytosis - near completion of 7 day course of cefepime for klebsiella - re-culture - recheck PCT  - trend WBC/ fever curve  Hypothyroidism - continue synthroid 50 mcg per tube daily   Depression - Continue home effexor and divalproate  Hypertension - Holding home antiHTN  Best practice:  Diet: tube feeding  Pain/Anxiety/Delirium protocol (if indicated): propofol, fentanyl, versed VAP protocol (if indicated): yes DVT prophylaxis: lovenox GI prophylaxis: Pantoprazole for stress ulcer prophylaxis Glucose control: SSI and NovoLog and Levemir Mobility: Bedrest Code Status: full Family Communication:  son, Kasandra Knudsen- pending Disposition: remain in ICU for ventilator management  Critical care time: 35 minutes  Kennieth Rad, MSN, AGACNP-BC [facility] 10/27/2019, 11:29 AM  See Shea Evans for personal pager PCCM on call pager [phone]

## 2019-10-28 ENCOUNTER — Inpatient Hospital Stay (HOSPITAL_COMMUNITY): Payer: Medicare Other

## 2019-10-28 LAB — POCT I-STAT 7, (LYTES, BLD GAS, ICA,H+H)
Acid-Base Excess: 5 mmol/L — ABNORMAL HIGH (ref 0.0–2.0)
Bicarbonate: 28.1 mmol/L — ABNORMAL HIGH (ref 20.0–28.0)
Calcium, Ion: 1.23 mmol/L (ref 1.15–1.40)
HCT: 33 % — ABNORMAL LOW (ref 36.0–46.0)
Hemoglobin: 11.2 g/dL — ABNORMAL LOW (ref 12.0–15.0)
O2 Saturation: 98 %
Potassium: 4.2 mmol/L (ref 3.5–5.1)
Sodium: 138 mmol/L (ref 135–145)
TCO2: 29 mmol/L (ref 22–32)
pCO2 arterial: 35.2 mmHg (ref 32.0–48.0)
pH, Arterial: 7.51 — ABNORMAL HIGH (ref 7.350–7.450)
pO2, Arterial: 87 mmHg (ref 83.0–108.0)

## 2019-10-28 LAB — CBC
HCT: 36.5 % (ref 36.0–46.0)
Hemoglobin: 11.7 g/dL — ABNORMAL LOW (ref 12.0–15.0)
MCH: 31 pg (ref 26.0–34.0)
MCHC: 32.1 g/dL (ref 30.0–36.0)
MCV: 96.6 fL (ref 80.0–100.0)
Platelets: 275 10*3/uL (ref 150–400)
RBC: 3.78 MIL/uL — ABNORMAL LOW (ref 3.87–5.11)
RDW: 14.2 % (ref 11.5–15.5)
WBC: 16 10*3/uL — ABNORMAL HIGH (ref 4.0–10.5)
nRBC: 0 % (ref 0.0–0.2)

## 2019-10-28 LAB — BASIC METABOLIC PANEL
Anion gap: 11 (ref 5–15)
BUN: 29 mg/dL — ABNORMAL HIGH (ref 8–23)
CO2: 26 mmol/L (ref 22–32)
Calcium: 8.8 mg/dL — ABNORMAL LOW (ref 8.9–10.3)
Chloride: 103 mmol/L (ref 98–111)
Creatinine, Ser: 0.52 mg/dL (ref 0.44–1.00)
GFR calc Af Amer: >60 mL/min (ref >60)
GFR calc non Af Amer: >60 mL/min (ref >60)
Glucose, Bld: 134 mg/dL — ABNORMAL HIGH (ref 70–99)
Potassium: 3.4 mmol/L — ABNORMAL LOW (ref 3.5–5.1)
Sodium: 140 mmol/L (ref 135–145)

## 2019-10-28 LAB — GLUCOSE, CAPILLARY
Glucose-Capillary: 110 mg/dL — ABNORMAL HIGH (ref 70–99)
Glucose-Capillary: 127 mg/dL — ABNORMAL HIGH (ref 70–99)
Glucose-Capillary: 193 mg/dL — ABNORMAL HIGH (ref 70–99)
Glucose-Capillary: 209 mg/dL — ABNORMAL HIGH (ref 70–99)
Glucose-Capillary: 130 mg/dL — ABNORMAL HIGH (ref 70–99)
Glucose-Capillary: 140 mg/dL — ABNORMAL HIGH (ref 70–99)

## 2019-10-28 LAB — PHOSPHORUS: Phosphorus: 2.5 mg/dL (ref 2.5–4.6)

## 2019-10-28 LAB — MAGNESIUM: Magnesium: 2 mg/dL (ref 1.7–2.4)

## 2019-10-28 LAB — PROCALCITONIN: Procalcitonin: 0.26 ng/mL

## 2019-10-28 MED ORDER — POLYVINYL ALCOHOL 1.4 % OP SOLN
1.0000 [drp] | Freq: Four times a day (QID) | OPHTHALMIC | Status: DC | PRN
  Administered 2019-10-28 – 2019-11-01 (×6): 1 [drp] via OPHTHALMIC
  Filled 2019-10-28: qty 15

## 2019-10-28 MED ORDER — FUROSEMIDE 10 MG/ML IJ SOLN
40.0000 mg | Freq: Once | INTRAMUSCULAR | Status: AC
  Administered 2019-10-28: 40 mg via INTRAVENOUS
  Filled 2019-10-28: qty 4

## 2019-10-28 MED ORDER — CHLORHEXIDINE GLUCONATE CLOTH 2 % EX PADS
6.0000 | MEDICATED_PAD | Freq: Every day | CUTANEOUS | Status: DC
  Administered 2019-10-29 – 2019-11-12 (×16): 6 via TOPICAL

## 2019-10-28 MED ORDER — HYPROMELLOSE (GONIOSCOPIC) 2.5 % OP SOLN: 1.0000 [drp] | Freq: Four times a day (QID) | OPHTHALMIC | Status: DC | PRN

## 2019-10-28 MED ORDER — POTASSIUM CHLORIDE 20 MEQ/15ML (10%) PO SOLN
40.0000 meq | Freq: Once | ORAL | Status: AC
  Administered 2019-10-28: 40 meq
  Filled 2019-10-28: qty 30

## 2019-10-28 NOTE — Progress Notes (Signed)
pr   NAME:  Jacqueline Lucas, MRN:  [id number], DOB:  January 25, 1951, LOS: 60 ADMISSION DATE:  10/20/2019, CONSULTATION DATE:  8/20 REFERRING MD:  EDP, CHIEF COMPLAINT:  Dyspnea   Brief History   69 y/o female admitted on 8/19 with severe acute respiratory failure with hypoxemia due to COVID 19 pneumonia.  Past Medical History   has a past medical history of Allergy, Allergy to adhesive tape, Anemia, Anginal pain ([place]), Anxiety, Arthritis, Asthma, Bronchitis, Cataract, Chronic nausea, Claustrophobia, DDD (degenerative disc disease), lumbar, Depression, Dry eyes, Fibromyalgia, GERD (gastroesophageal reflux disease), H/O bladder infections, Heart murmur, Hematuria, Hyperlipidemia, Hypertension, Hypothyroidism, IBS (irritable bowel syndrome), IDA (iron deficiency anemia) (05/13/2019), Insomnia, Lumbar neuritis, Migraines, Obesity, Osteoporosis, Palpitation, RLS (restless legs syndrome), Shoulder fracture, right, Sicca ([place]), Tachycardia, Thyroid disease, and Wears dentures.   Significant Hospital Events   8/20 Admit to PCCM 8/21 failed SBT, oxygenation worsened thorought the day  Consults:    Procedures:  8/20 ETT >> 8/22 R PICC  >>  Significant Diagnostic Tests:  BLE venous dopplers 8/26>>> neg   Micro Data:  8/20 SARS COV 2>>positive 8/20 UC>> neg 8/20 BCx2>> neg 8/24 BCx2 >> neg 8/24 respiratory culture >> Klebsiella 8/30 BCx 2 >> 8/30 trach asp >>  Antimicrobials/COVID:  8/20 Remdesivir > 8/24 8/20 baricitinib >> 8/20 solumedrol > taper started 8/27 >> 8/24 cefepime >>8/31  Interim history/subjective:  No acute event overnight  Patient is seen at bedside.  She is sedated and intubated.  Still have epitaxis with proning.  No bradycardia spell  I called and spoke to patient's son, Kasandra Knudsen.  I updated him with patient's current respiratory status and proning.  He voiced understand.  All questions were answered  Objective   Blood pressure 138/67, pulse 95, temperature 99.1 F  (37.3 C), resp. rate (!) 28, height 5\' 4"  (1.626 m), weight 101 kg, SpO2 98 %.    Vent Mode: PRVC FiO2 (%):  [60 %] 60 % Set Rate:  [28 bmp] 28 bmp Vt Set:  [440 mL] 440 mL PEEP:  [12 cmH20] 12 cmH20 Plateau Pressure:  [26 cmH20-30 cmH20] 28 cmH20   Intake/Output Summary (Last 24 hours) at 10/28/2019 1310 Last data filed at 10/28/2019 1100 Gross per 24 hour  Intake 2905.68 ml  Output 4600 ml  Net -1694.32 ml   Filed Weights   10/26/19 0500 10/27/19 0500 10/28/19 0500  Weight: 99.3 kg 101.5 kg 101 kg   General:  Critically ill, obese, older female proned and sedated on MV HEENT: no active bleeding from nares/ oral, ETT Neuro: sedated, non responsive CV: Difficult to auscultate because patient was prone PULM:  MV supported breaths,  diminished in bases GI: soft Extremities: warm/dry, no edema  skin: no rashes  Resolved Hospital Problem list     Assessment & Plan:  Acute respiratory failure requiring mechanical ventilation due to COVID-19 HCAP AKI-resolved Hypertension Diabetes with hyperglycemia, A1c 6.6 Hypertension Hypothyroidism Depression  Plan:  Acute respiratory failure requiring mechanical ventilation COVID-19 pneumonia Underlying asthma Klebsiella HCAP Continue mechanical ventilation per ARDS protocol Target TVol 6-8cc/kgIBW, currently at 8 cc/kg Target Plateau Pressure < 30cm H20 Target driving pressure less than 15 cm of water Target PaO2 55-65: titrate PEEP/ FiO2 per protocol As long as PaO2 to FiO2 ratio is less than 1:150 position in prone position for 16 hours a day ABG now and after supine Trend CXR Ventilator associated pneumonia prevention protocol Trend inflammatory markers Continue FiO2 60% with PEEP of 12 Recheck ABG after supine. Continue  solumedrol taper and baricitinib per pharmacy  PAD protocol with propofol, fentanyl and versed gtts with bowel regimen  Diuresis of  lasix 40 meq once today  Completed 7-day of cefepime for Klebsiella  pneumonia  Asystole Bradycardia Patient had an episode of brief asystole and bradycardia in the 40s which improved after 1 mg of atropine 8/29.  - no further episodes.  Troponin normal.   - continue tele monitoring  Elevated Ddimer >20 BLE venous doppler neg. current D-dimer 3.5 -Lovenox for DVT prophylactic  Diabetes with hyperglycemia - A1c of 6.6.   - Glucose at goal < 180 - continue SSI resistant - levemir 10 units BID  Epistaxis - prn Afrin   Hypothyroidism - continue synthroid 50 mcg per tube daily   Depression - Continue home effexor and divalproate  Hypertension - Holding home antiHTN  Best practice:  Diet: tube feeding  Pain/Anxiety/Delirium protocol (if indicated): propofol, fentanyl, versed VAP protocol (if indicated): yes DVT prophylaxis: lovenox GI prophylaxis: Pantoprazole for stress ulcer prophylaxis Glucose control: SSI and NovoLog and Levemir Mobility: Bedrest Code Status: full Family Communication: Updated son Disposition: remain in ICU for ventilator management  Critical care time: 25 minutes  [name], DO Internal Medicine Residency My pager: [phone]

## 2019-10-28 NOTE — Progress Notes (Signed)
[facility] Progress Note Patient Name: Jacqueline Lucas DOB: [birth year] MRN: [id number]   Date of Service  10/28/2019  HPI/Events of Note  Patient with dry eyes.  eICU Interventions  Artrificial tears ordered, patient is allergic to Lacrilube.        Frederik Pear 10/28/2019, 8:53 PM

## 2019-10-28 NOTE — Progress Notes (Signed)
[facility] ADULT ICU REPLACEMENT PROTOCOL   The patient does apply for the [facility] Adult ICU Electrolyte Replacment Protocol based on the criteria listed below:   1. Is GFR >/= 30 ml/min? Yes.    Patient's GFR today is >60 2. Is SCr </= 2? Yes.   Patient's SCr is 0.52 ml/kg/hr 3. Did SCr increase >/= 0.5 in 24 hours? No. 4. Abnormal electrolyte(s): k 3.2 5. Ordered repletion with: protocol 6. If a panic level lab has been reported, has the CCM MD in charge been notified? No..   Physician:    Ronda Fairly A 10/28/2019 6:53 AM

## 2019-10-28 NOTE — Progress Notes (Signed)
Pt being proned at this time.  4x RN and 2x RT present, no complications noted.

## 2019-10-28 NOTE — Progress Notes (Signed)
Pt's mouth showing fresh blood at this time as well after turning head. Mouth and nose suctioned removing tan/bloody secretions.

## 2019-10-28 NOTE — Progress Notes (Signed)
Pt supined at this time with no complications. Small abrasion continues from previous prones at corner of left eye. RN aware. Pt ett secured in proper position with commercial tube holder.

## 2019-10-29 ENCOUNTER — Inpatient Hospital Stay (HOSPITAL_COMMUNITY): Payer: Medicare Other

## 2019-10-29 LAB — POCT I-STAT 7, (LYTES, BLD GAS, ICA,H+H)
Acid-Base Excess: 5 mmol/L — ABNORMAL HIGH (ref 0.0–2.0)
Bicarbonate: 29 mmol/L — ABNORMAL HIGH (ref 20.0–28.0)
Calcium, Ion: 1.23 mmol/L (ref 1.15–1.40)
HCT: 34 % — ABNORMAL LOW (ref 36.0–46.0)
Hemoglobin: 11.6 g/dL — ABNORMAL LOW (ref 12.0–15.0)
O2 Saturation: 93 %
Patient temperature: 36.69
Potassium: 5.3 mmol/L — ABNORMAL HIGH (ref 3.5–5.1)
Sodium: 138 mmol/L (ref 135–145)
TCO2: 30 mmol/L (ref 22–32)
pCO2 arterial: 37.3 mmHg (ref 32.0–48.0)
pH, Arterial: 7.497 — ABNORMAL HIGH (ref 7.350–7.450)
pO2, Arterial: 61 mmHg — ABNORMAL LOW (ref 83.0–108.0)

## 2019-10-29 LAB — CBC
HCT: 33.8 % — ABNORMAL LOW (ref 36.0–46.0)
Hemoglobin: 10.9 g/dL — ABNORMAL LOW (ref 12.0–15.0)
MCH: 30.7 pg (ref 26.0–34.0)
MCHC: 32.2 g/dL (ref 30.0–36.0)
MCV: 95.2 fL (ref 80.0–100.0)
Platelets: 340 10*3/uL (ref 150–400)
RBC: 3.55 MIL/uL — ABNORMAL LOW (ref 3.87–5.11)
RDW: 14.1 % (ref 11.5–15.5)
WBC: 15.6 10*3/uL — ABNORMAL HIGH (ref 4.0–10.5)
nRBC: 0 % (ref 0.0–0.2)

## 2019-10-29 LAB — BASIC METABOLIC PANEL
Anion gap: 11 (ref 5–15)
BUN: 27 mg/dL — ABNORMAL HIGH (ref 8–23)
CO2: 26 mmol/L (ref 22–32)
Calcium: 8.5 mg/dL — ABNORMAL LOW (ref 8.9–10.3)
Chloride: 98 mmol/L (ref 98–111)
Creatinine, Ser: 0.41 mg/dL — ABNORMAL LOW (ref 0.44–1.00)
GFR calc Af Amer: >60 mL/min (ref >60)
GFR calc non Af Amer: >60 mL/min (ref >60)
Glucose, Bld: 129 mg/dL — ABNORMAL HIGH (ref 70–99)
Potassium: 3.8 mmol/L (ref 3.5–5.1)
Sodium: 135 mmol/L (ref 135–145)

## 2019-10-29 LAB — GLUCOSE, CAPILLARY
Glucose-Capillary: 134 mg/dL — ABNORMAL HIGH (ref 70–99)
Glucose-Capillary: 123 mg/dL — ABNORMAL HIGH (ref 70–99)
Glucose-Capillary: 143 mg/dL — ABNORMAL HIGH (ref 70–99)
Glucose-Capillary: 203 mg/dL — ABNORMAL HIGH (ref 70–99)
Glucose-Capillary: 168 mg/dL — ABNORMAL HIGH (ref 70–99)
Glucose-Capillary: 149 mg/dL — ABNORMAL HIGH (ref 70–99)

## 2019-10-29 MED ORDER — POTASSIUM CHLORIDE 20 MEQ/15ML (10%) PO SOLN
40.0000 meq | Freq: Once | ORAL | Status: AC
  Administered 2019-10-29: 40 meq
  Filled 2019-10-29: qty 30

## 2019-10-29 MED ORDER — GUAIFENESIN 100 MG/5ML PO SOLN
5.0000 mL | ORAL | Status: DC | PRN
  Administered 2019-10-29: 100 mg via ORAL
  Filled 2019-10-29 (×2): qty 10

## 2019-10-29 MED ORDER — FUROSEMIDE 10 MG/ML IJ SOLN
40.0000 mg | Freq: Once | INTRAMUSCULAR | Status: AC
  Administered 2019-10-29: 40 mg via INTRAVENOUS
  Filled 2019-10-29: qty 4

## 2019-10-29 NOTE — Progress Notes (Signed)
pr   NAME:  Jacqueline Lucas, MRN:  [id number], DOB:  December 19, 1950, LOS: 12 ADMISSION DATE:  10/12/2019, CONSULTATION DATE:  8/20 REFERRING MD:  EDP, CHIEF COMPLAINT:  Dyspnea   Brief History   69 y/o female admitted on 8/19 with severe acute respiratory failure with hypoxemia due to COVID 19 pneumonia.  Past Medical History   has a past medical history of Allergy, Allergy to adhesive tape, Anemia, Anginal pain ([place]), Anxiety, Arthritis, Asthma, Bronchitis, Cataract, Chronic nausea, Claustrophobia, DDD (degenerative disc disease), lumbar, Depression, Dry eyes, Fibromyalgia, GERD (gastroesophageal reflux disease), H/O bladder infections, Heart murmur, Hematuria, Hyperlipidemia, Hypertension, Hypothyroidism, IBS (irritable bowel syndrome), IDA (iron deficiency anemia) (05/13/2019), Insomnia, Lumbar neuritis, Migraines, Obesity, Osteoporosis, Palpitation, RLS (restless legs syndrome), Shoulder fracture, right, Sicca ([place]), Tachycardia, Thyroid disease, and Wears dentures.   Significant Hospital Events   8/20 Admit to PCCM 8/21 failed SBT, oxygenation worsened thorought the day  Consults:    Procedures:  8/20 ETT >> 8/22 R PICC  >>  Significant Diagnostic Tests:  BLE venous dopplers 8/26>>> neg   Micro Data:  8/20 SARS COV 2>>positive 8/20 UC>> neg 8/20 BCx2>> neg 8/24 BCx2 >> neg 8/24 respiratory culture >> Klebsiella 8/30 BCx 2 >> 8/30 trach asp >>  Antimicrobials/COVID:  8/20 Remdesivir > 8/24 8/20 baricitinib >> 8/20 solumedrol > taper started 8/27 >> 8/24 cefepime >>8/31  Interim history/subjective:  No acute event overnight Did not req proning yesterday evening.  cortrak needs to be advanced.  Increasing sputum production will culture and monitor tmax 101  Objective   Blood pressure 137/65, pulse (!) 101, temperature 99.9 F (37.7 C), resp. rate (!) 28, height 5\' 4"  (1.626 m), weight 98.5 kg, SpO2 92 %.    Vent Mode: PRVC FiO2 (%):  [40 %-50 %] 40 % Set Rate:   [28 bmp] 28 bmp Vt Set:  [440 mL] 440 mL PEEP:  [12 cmH20] 12 cmH20 Plateau Pressure:  [28 cmH20-31 cmH20] 31 cmH20   Intake/Output Summary (Last 24 hours) at 10/29/2019 1440 Last data filed at 10/29/2019 1131 Gross per 24 hour  Intake 1937.95 ml  Output 2010 ml  Net -72.05 ml   Filed Weights   10/27/19 0500 10/28/19 0500 10/29/19 0354  Weight: 101.5 kg 101 kg 98.5 kg   General:  Critically ill, obese, older female supine nd sedated on MV HEENT: no active bleeding from nares/ oral, ETT Neuro: sedated, non responsive CV:rrr PULM:  MV supported breaths,  diminished in bases GI: soft nt nd bs+ Extremities: warm/dry, no edema  skin: no rashes  Resolved Hospital Problem list     Assessment & Plan:  Acute respiratory failure requiring mechanical ventilation due to COVID-19 HCAP AKI-resolved Hypertension Diabetes with hyperglycemia, A1c 6.6 Hypertension Hypothyroidism Depression  Plan:  Acute respiratory failure requiring mechanical ventilation COVID-19 pneumonia Underlying asthma Klebsiella HCAP Continue mechanical ventilation per ARDS protocol Target TVol 6-8cc/kgIBW, currently at 8 cc/kg Target Plateau Pressure < 30cm H20 Target driving pressure less than 15 cm of water Target PaO2 55-65: titrate PEEP/ FiO2 per protocol As long as PaO2 to FiO2 ratio is less than <150 position in prone position for 16 hours a day Did not req proning last night Trend CXR Ventilator associated pneumonia prevention protocol Trend inflammatory markers Continue solumedrol taper and baricitinib per pharmacy  PAD protocol with propofol, fentanyl and versed gtts with bowel regimen  Diuresis of  lasix 40 meq again and replace K Completed 7-day of cefepime for Klebsiella pneumonia -with increased sputum production today  will recx again. tmax 101, holding on abx since just completed course for now.   Asystole Bradycardia Patient had an episode of brief asystole and bradycardia in the 40s  which improved after 1 mg of atropine 8/29.  - no further episodes.  Troponin normal.   - continue tele monitoring  Elevated Ddimer >20 BLE venous doppler neg. current D-dimer 3.5 -Lovenox for DVT prophylactic  Diabetes with hyperglycemia - A1c of 6.6.   - Glucose at goal < 180 - cont ssi and basal insulin  Epistaxis - prn Afrin   Hypothyroidism - continue synthroid 50 mcg per tube daily   Depression - Continue home effexor and divalproate  Hypertension - Holding home antiHTN  Best practice:  Diet: tube feeding  Pain/Anxiety/Delirium protocol (if indicated): propofol, fentanyl, versed VAP protocol (if indicated): yes DVT prophylaxis: lovenox GI prophylaxis: Pantoprazole for stress ulcer prophylaxis Glucose control: SSI and NovoLog and Levemir Mobility: Bedrest Code Status: full Family Communication: Updated son Disposition: remain in ICU for ventilator management  Critical care time: The patient is critically ill with multiple organ systems failure and requires high complexity decision making for assessment and support, frequent evaluation and titration of therapies, application of advanced monitoring technologies and extensive interpretation of multiple databases.  Critical care time 36 mins. This represents my time independent of the NPs time taking care of the pt. This is excluding procedures.    [facility] 10/29/2019, 2:41 PM

## 2019-10-29 NOTE — Progress Notes (Signed)
[facility] TEAM NOTE  Consult received to advance Cortrak tube, if possible. Cortrak tube in R nare and bridled at 65 cm. Cortrak tube was placed on 8/25.   Able to advance to 82 cm. Felt that tube was at the pylorus at 80 cm, left 2 cm slack in the hope that tube will migrate through the pylorus with peristalsis.   Cortrak tube is able to be used again immediately.     Jarome Matin, MS, RD, LDN, CNSC Inpatient Clinical Dietitian RD pager # available in [place]  After hours/weekend pager # available in [facility]

## 2019-10-29 DEATH — deceased

## 2019-10-30 LAB — GLUCOSE, CAPILLARY
Glucose-Capillary: 137 mg/dL — ABNORMAL HIGH (ref 70–99)
Glucose-Capillary: 144 mg/dL — ABNORMAL HIGH (ref 70–99)
Glucose-Capillary: 152 mg/dL — ABNORMAL HIGH (ref 70–99)
Glucose-Capillary: 155 mg/dL — ABNORMAL HIGH (ref 70–99)
Glucose-Capillary: 162 mg/dL — ABNORMAL HIGH (ref 70–99)
Glucose-Capillary: 164 mg/dL — ABNORMAL HIGH (ref 70–99)

## 2019-10-30 LAB — BASIC METABOLIC PANEL
Anion gap: 8 (ref 5–15)
BUN: 27 mg/dL — ABNORMAL HIGH (ref 8–23)
CO2: 27 mmol/L (ref 22–32)
Calcium: 8.8 mg/dL — ABNORMAL LOW (ref 8.9–10.3)
Chloride: 104 mmol/L (ref 98–111)
Creatinine, Ser: 0.47 mg/dL (ref 0.44–1.00)
GFR calc Af Amer: >60 mL/min (ref >60)
GFR calc non Af Amer: >60 mL/min (ref >60)
Glucose, Bld: 143 mg/dL — ABNORMAL HIGH (ref 70–99)
Potassium: 4.2 mmol/L (ref 3.5–5.1)
Sodium: 139 mmol/L (ref 135–145)

## 2019-10-30 LAB — CBC
HCT: 33.4 % — ABNORMAL LOW (ref 36.0–46.0)
Hemoglobin: 10.4 g/dL — ABNORMAL LOW (ref 12.0–15.0)
MCH: 29.9 pg (ref 26.0–34.0)
MCHC: 31.1 g/dL (ref 30.0–36.0)
MCV: 96 fL (ref 80.0–100.0)
Platelets: 369 10*3/uL (ref 150–400)
RBC: 3.48 MIL/uL — ABNORMAL LOW (ref 3.87–5.11)
RDW: 14.3 % (ref 11.5–15.5)
WBC: 12.3 10*3/uL — ABNORMAL HIGH (ref 4.0–10.5)
nRBC: 0 % (ref 0.0–0.2)

## 2019-10-30 LAB — TRIGLYCERIDES: Triglycerides: 194 mg/dL — ABNORMAL HIGH (ref <150)

## 2019-10-30 MED ORDER — MIDAZOLAM HCL 2 MG/2ML IJ SOLN
1.0000 mg | INTRAMUSCULAR | Status: DC | PRN
  Administered 2019-10-31 – 2019-11-01 (×5): 1 mg via INTRAVENOUS
  Filled 2019-10-30 (×4): qty 2

## 2019-10-30 MED ORDER — MIDAZOLAM HCL 2 MG/2ML IJ SOLN
1.0000 mg | INTRAMUSCULAR | Status: DC | PRN
  Administered 2019-10-31 – 2019-11-01 (×2): 1 mg via INTRAVENOUS
  Filled 2019-10-30 (×6): qty 2

## 2019-10-30 MED ORDER — FUROSEMIDE 10 MG/ML IJ SOLN
40.0000 mg | Freq: Once | INTRAMUSCULAR | Status: AC
  Administered 2019-10-30: 40 mg via INTRAVENOUS
  Filled 2019-10-30: qty 4

## 2019-10-30 MED ORDER — FENTANYL CITRATE (PF) 100 MCG/2ML IJ SOLN
25.0000 ug | INTRAMUSCULAR | Status: DC | PRN
  Administered 2019-10-30 – 2019-10-31 (×9): 100 ug via INTRAVENOUS
  Administered 2019-10-31: 50 ug via INTRAVENOUS
  Administered 2019-10-31 – 2019-11-04 (×9): 100 ug via INTRAVENOUS
  Administered 2019-11-04: 50 ug via INTRAVENOUS
  Administered 2019-11-04 – 2019-11-06 (×11): 100 ug via INTRAVENOUS
  Administered 2019-11-06: 50 ug via INTRAVENOUS
  Administered 2019-11-06 – 2019-11-07 (×7): 100 ug via INTRAVENOUS
  Administered 2019-11-08: 50 ug via INTRAVENOUS
  Administered 2019-11-08: 100 ug via INTRAVENOUS
  Administered 2019-11-11 (×2): 50 ug via INTRAVENOUS
  Filled 2019-10-30 (×35): qty 2

## 2019-10-30 MED ORDER — FENTANYL CITRATE (PF) 100 MCG/2ML IJ SOLN
25.0000 ug | INTRAMUSCULAR | Status: DC | PRN
  Filled 2019-10-30: qty 2

## 2019-10-30 NOTE — Progress Notes (Signed)
Pt w/o UOP this AM>bladder scan for 625 ml> I&O cath=650 ml.

## 2019-10-30 NOTE — Progress Notes (Signed)
NAME:  Jacqueline Lucas, MRN:  [id number], DOB:  01/13/1951, LOS: 53 ADMISSION DATE:  09/28/2019, CONSULTATION DATE:  8/20 REFERRING MD:  EDP, CHIEF COMPLAINT:  Dyspnea   Brief History   69 y/o female admitted on 8/19 with severe acute respiratory failure with hypoxemia due to COVID 19 pneumonia.  Past Medical History  Asthma Obesity Hypertension Hyperlipidemia Degenerative disk disease Depression Fibromyalgia Heart murmur GERD Hypothyroidism IDA Insomnia Fe def anemia  Significant Hospital Events   8/20 Admit to PCCM 8/21 failed SBT, oxygenation worsened thorought the day  Consults:   Procedures:  8/20 ETT >> 8/22 R PICC  >>  Significant Diagnostic Tests:  8/26 LE doppler > negative for DVT 8/27 TTE> LVEF 65-70%, Grade I DD, Mitral valve normal, RV normal size/function  Micro Data:  8/20 SARS COV 2>>positive 8/20 UC>> neg 8/20 BCx2>> neg 8/24 BCx2 >> neg 8/24 respiratory culture >> Klebsiella 8/30 BCx 2 >> 8/30 trach asp >>  Antimicrobials:  8/20 Remdesivir >8/24 8/20 baricitinib >> 8/20 solumedrol > taper started 8/27 >> 8/24 cefepime >>8/31  Interim history/subjective:  Improved oxygenation Following commands Net negative  Objective   Blood pressure 131/63, pulse 69, temperature 97.7 F (36.5 C), temperature source Axillary, resp. rate (!) 28, height 5\' 4"  (1.626 m), weight 95.4 kg, SpO2 95 %.    Vent Mode: PRVC FiO2 (%):  [40 %] 40 % Set Rate:  [28 bmp] 28 bmp Vt Set:  [440 mL] 440 mL PEEP:  [12 cmH20] 12 cmH20 Plateau Pressure:  [28 cmH20-30 cmH20] 30 cmH20   Intake/Output Summary (Last 24 hours) at 10/30/2019 1024 Last data filed at 10/30/2019 0900 Gross per 24 hour  Intake 1974.76 ml  Output 3835 ml  Net -1860.24 ml   Filed Weights   10/28/19 0500 10/29/19 0354 10/30/19 0354  Weight: 101 kg 98.5 kg 95.4 kg    Examination:  General:  In bed on vent HENT: NCAT ETT in place PULM: CTA B, vent supported breathing CV: RRR, no  mgr GI: BS+, soft, nontender MSK: normal bulk and tone Neuro: awake, follows simple commands (wiggle toes)   Resolved Hospital Problem list   ARDS due to COVID 19 > improving, mental status/generalized weakness barrier to extubation HCAP> Klebsiella Pressure support as long as tolerated Lasix again today Full mechanical vent support VAP prevention Daily WUA/SBT baricitinib to complete 14 days  Need for sedation for mechanical ventilation Stop continuous infusions RASS goal 0 to -1 Intermittent sedation protocol  Hypertension Monitor   DM2 with hyperglycemia SSI Levemir Monitor accuchecks  Long pause/bradycardia Tele monitor  Epistaxis Afrin prn  Hypothyroidism synthroic  Depression depakote effexor   Assessment & Plan:    Best practice:  Diet: tube feeding Pain/Anxiety/Delirium protocol (if indicated): as above VAP protocol (if indicated): yes DVT prophylaxis: lovenox GI prophylaxis: pantoprazole Glucose control: SSI/Levemir Mobility: bed rest Code Status: full Family Communication: I called her son Kasandra Knudsen to give him an  Disposition: remain in ICU  Labs   CBC: Recent Labs  Lab 10/26/19 0528 10/26/19 1712 10/27/19 0500 10/27/19 1133 10/28/19 0310 10/28/19 1742 10/29/19 0505 10/29/19 1153 10/30/19 0329  WBC 13.4*  --  17.0*  --  16.0*  --  15.6*  --  12.3*  HGB 11.3*   < > 11.1*   < > 11.7* 11.2* 10.9* 11.6* 10.4*  HCT 35.1*   < > 34.2*   < > 36.5 33.0* 33.8* 34.0* 33.4*  MCV 95.1  --  96.3  --  96.6  --  95.2  --  96.0  PLT 269  --  303  --  275  --  340  --  369   < > = values in this interval not displayed.    Basic Metabolic Panel: Recent Labs  Lab 10/26/19 0528 10/26/19 1712 10/27/19 0500 10/27/19 1133 10/28/19 0310 10/28/19 1742 10/29/19 0349 10/29/19 1153 10/30/19 0329  NA 139   < > 141   < > 140 138 135 138 139  K 3.9   < > 4.1   < > 3.4* 4.2 3.8 5.3* 4.2  CL 106  --  107  --  103  --  98  --  104  CO2 24  --  25  --   26  --  26  --  27  GLUCOSE 131*  --  83  --  134*  --  129*  --  143*  BUN 27*  --  28*  --  29*  --  27*  --  27*  CREATININE 0.50  --  0.53  --  0.52  --  0.41*  --  0.47  CALCIUM 8.8*  --  9.1  --  8.8*  --  8.5*  --  8.8*  MG  --   --   --   --  2.0  --   --   --   --   PHOS  --   --   --   --  2.5  --   --   --   --    < > = values in this interval not displayed.   GFR: Estimated Creatinine Clearance: 73.3 mL/min (by C-G formula based on SCr of 0.47 mg/dL). Recent Labs  Lab 10/27/19 0500 10/28/19 0310 10/29/19 0505 10/30/19 0329  PROCALCITON  --  0.26  --   --   WBC 17.0* 16.0* 15.6* 12.3*    Liver Function Tests: No results for input(s): AST, ALT, ALKPHOS, BILITOT, PROT, ALBUMIN in the last 168 hours. No results for input(s): LIPASE, AMYLASE in the last 168 hours. No results for input(s): AMMONIA in the last 168 hours.  ABG    Component Value Date/Time   PHART 7.497 (H) 10/29/2019 1153   PCO2ART 37.3 10/29/2019 1153   PO2ART 61 (L) 10/29/2019 1153   HCO3 29.0 (H) 10/29/2019 1153   TCO2 30 10/29/2019 1153   ACIDBASEDEF 1.0 10/25/2019 1259   O2SAT 93.0 10/29/2019 1153     Coagulation Profile: No results for input(s): INR, PROTIME in the last 168 hours.  Cardiac Enzymes: No results for input(s): CKTOTAL, CKMB, CKMBINDEX, TROPONINI in the last 168 hours.  HbA1C: Hgb A1c MFr Bld  Date/Time Value Ref Range Status  10/20/2019 04:04 AM 6.6 (H) 4.8 - 5.6 % Final    Comment:    (NOTE) Pre diabetes:          5.7%-6.4%  Diabetes:              >6.4%  Glycemic control for   <7.0% adults with diabetes   10/18/2019 03:53 PM 6.5 (H) 4.8 - 5.6 % Final    Comment:    (NOTE) Pre diabetes:          5.7%-6.4%  Diabetes:              >6.4%  Glycemic control for   <7.0% adults with diabetes     CBG: Recent Labs  Lab 10/29/19 1530 10/29/19 2014 10/29/19 2317 10/30/19 0323 10/30/19 [place]  203* 168* 149* 137* 155*     Critical care time: 35  minutes    Roselie Awkward, MD [facility] Pager: [phone] Cell: [phone] If no response, call [phone]

## 2019-10-31 ENCOUNTER — Inpatient Hospital Stay (HOSPITAL_COMMUNITY): Payer: Medicare Other

## 2019-10-31 LAB — COMPREHENSIVE METABOLIC PANEL
ALT: 35 U/L (ref 0–44)
AST: 39 U/L (ref 15–41)
Albumin: 1.9 g/dL — ABNORMAL LOW (ref 3.5–5.0)
Alkaline Phosphatase: 60 U/L (ref 38–126)
Anion gap: 10 (ref 5–15)
BUN: 30 mg/dL — ABNORMAL HIGH (ref 8–23)
CO2: 26 mmol/L (ref 22–32)
Calcium: 9 mg/dL (ref 8.9–10.3)
Chloride: 103 mmol/L (ref 98–111)
Creatinine, Ser: 0.48 mg/dL (ref 0.44–1.00)
GFR calc Af Amer: >60 mL/min (ref >60)
GFR calc non Af Amer: >60 mL/min (ref >60)
Glucose, Bld: 144 mg/dL — ABNORMAL HIGH (ref 70–99)
Potassium: 4 mmol/L (ref 3.5–5.1)
Sodium: 139 mmol/L (ref 135–145)
Total Bilirubin: 0.5 mg/dL (ref 0.3–1.2)
Total Protein: 5.8 g/dL — ABNORMAL LOW (ref 6.5–8.1)

## 2019-10-31 LAB — CBC WITH DIFFERENTIAL/PLATELET
Abs Immature Granulocytes: 0.92 10*3/uL — ABNORMAL HIGH (ref 0.00–0.07)
Basophils Absolute: 0.1 10*3/uL (ref 0.0–0.1)
Basophils Relative: 1 %
Eosinophils Absolute: 0.1 10*3/uL (ref 0.0–0.5)
Eosinophils Relative: 1 %
HCT: 34.4 % — ABNORMAL LOW (ref 36.0–46.0)
Hemoglobin: 10.8 g/dL — ABNORMAL LOW (ref 12.0–15.0)
Immature Granulocytes: 7 %
Lymphocytes Relative: 6 %
Lymphs Abs: 0.9 10*3/uL (ref 0.7–4.0)
MCH: 30.4 pg (ref 26.0–34.0)
MCHC: 31.4 g/dL (ref 30.0–36.0)
MCV: 96.9 fL (ref 80.0–100.0)
Monocytes Absolute: 0.6 10*3/uL (ref 0.1–1.0)
Monocytes Relative: 5 %
Neutro Abs: 10.9 10*3/uL — ABNORMAL HIGH (ref 1.7–7.7)
Neutrophils Relative %: 80 %
Platelets: 416 10*3/uL — ABNORMAL HIGH (ref 150–400)
RBC: 3.55 MIL/uL — ABNORMAL LOW (ref 3.87–5.11)
RDW: 14.1 % (ref 11.5–15.5)
Smear Review: ADEQUATE
WBC: 13.5 10*3/uL — ABNORMAL HIGH (ref 4.0–10.5)
nRBC: 0 % (ref 0.0–0.2)

## 2019-10-31 LAB — GLUCOSE, CAPILLARY
Glucose-Capillary: 124 mg/dL — ABNORMAL HIGH (ref 70–99)
Glucose-Capillary: 130 mg/dL — ABNORMAL HIGH (ref 70–99)
Glucose-Capillary: 142 mg/dL — ABNORMAL HIGH (ref 70–99)
Glucose-Capillary: 148 mg/dL — ABNORMAL HIGH (ref 70–99)
Glucose-Capillary: 157 mg/dL — ABNORMAL HIGH (ref 70–99)
Glucose-Capillary: 202 mg/dL — ABNORMAL HIGH (ref 70–99)

## 2019-10-31 MED ORDER — OXYCODONE HCL 5 MG PO TABS
5.0000 mg | ORAL_TABLET | Freq: Three times a day (TID) | ORAL | Status: DC
  Administered 2019-10-31 – 2019-11-03 (×8): 5 mg
  Filled 2019-10-31 (×9): qty 1

## 2019-10-31 MED ORDER — VITAL 1.5 CAL PO LIQD
1000.0000 mL | ORAL | Status: DC
  Administered 2019-10-31 – 2019-11-10 (×11): 1000 mL
  Filled 2019-10-31 (×10): qty 1000

## 2019-10-31 MED ORDER — FUROSEMIDE 10 MG/ML IJ SOLN
40.0000 mg | Freq: Once | INTRAMUSCULAR | Status: AC
  Administered 2019-10-31: 40 mg via INTRAVENOUS
  Filled 2019-10-31: qty 4

## 2019-10-31 MED ORDER — QUETIAPINE FUMARATE 50 MG PO TABS
50.0000 mg | ORAL_TABLET | Freq: Every day | ORAL | Status: DC
  Administered 2019-10-31 – 2019-11-09 (×10): 50 mg via ORAL
  Filled 2019-10-31 (×10): qty 1

## 2019-10-31 MED ORDER — PROSOURCE TF PO LIQD
45.0000 mL | Freq: Four times a day (QID) | ORAL | Status: DC
  Administered 2019-10-31 – 2019-11-12 (×49): 45 mL
  Filled 2019-10-31 (×48): qty 45

## 2019-10-31 MED ORDER — FENTANYL 2500MCG IN NS 250ML (10MCG/ML) PREMIX INFUSION
0.0000 ug/h | INTRAVENOUS | Status: DC
  Administered 2019-10-31: 100 ug/h via INTRAVENOUS
  Administered 2019-11-01: 250 ug/h via INTRAVENOUS
  Administered 2019-11-01 (×2): 150 ug/h via INTRAVENOUS
  Administered 2019-11-02: 250 ug/h via INTRAVENOUS
  Administered 2019-11-02 – 2019-11-03 (×2): 200 ug/h via INTRAVENOUS
  Filled 2019-10-31 (×7): qty 250

## 2019-10-31 NOTE — Progress Notes (Signed)
NAME:  Jacqueline Lucas, MRN:  [id number], DOB:  Mar 29, 1950, LOS: 8 ADMISSION DATE:  10/27/2019, CONSULTATION DATE:  8/20 REFERRING MD:  EDP, CHIEF COMPLAINT:  Dyspnea   Brief History   69 y/o female admitted on 8/19 with severe acute respiratory failure with hypoxemia due to COVID 19 pneumonia.  Past Medical History  Asthma Obesity Hypertension Hyperlipidemia Degenerative disk disease Depression Fibromyalgia Heart murmur GERD Hypothyroidism IDA Insomnia Fe def anemia  Significant Hospital Events   8/20 Admit to [facility] 8/21 failed SBT, oxygenation worsened thorought the day 9/3 awake, following commands, weaning PSV 12/10  Consults:   Procedures:  8/20 ETT >> 8/22 R PICC  >>  Significant Diagnostic Tests:  8/26 LE doppler > negative for DVT 8/27 TTE> LVEF 65-70%, Grade I DD, Mitral valve normal, RV normal size/function  Micro Data:  8/20 SARS COV 2>>positive 8/20 UC>> neg 8/20 BCx2>> neg 8/24 BCx2 >> neg 8/24 respiratory culture >> Klebsiella 8/30 BCx 2 >> 8/30 trach asp >>  Antimicrobials:  8/20 Remdesivir >8/24 8/20 baricitinib >> 9/2 8/20 solumedrol > taper started 8/27 >> 8/24 cefepime >>8/31  Interim history/subjective:   Weaning on 12/10 More awake, nodding head appropriately, following commands Required frequent fentanyl push overnight  Objective   Blood pressure 132/67, pulse 98, temperature 99 F (37.2 C), temperature source Oral, resp. rate 17, height 5\' 4"  (1.626 m), weight 94.9 kg, SpO2 92 %.    Vent Mode: PRVC FiO2 (%):  [40 %] 40 % Set Rate:  [28 bmp] 28 bmp Vt Set:  [440 mL] 440 mL PEEP:  [12 cmH20] 12 cmH20 Plateau Pressure:  [30 cmH20-31 cmH20] 30 cmH20   Intake/Output Summary (Last 24 hours) at 10/31/2019 7408 Last data filed at 10/31/2019 0600 Gross per 24 hour  Intake 1780.53 ml  Output 1500 ml  Net 280.53 ml   Filed Weights   10/29/19 0354 10/30/19 0354 10/31/19 0500  Weight: 98.5 kg 95.4 kg 94.9 kg     Examination:  General:  In bed on vent HENT: NCAT ETT in place PULM: CTA B, vent supported breathing CV: RRR, no mgr GI: BS+, soft, nontender MSK: normal bulk and tone Neuro: awake, nods head appropriately   Resolved Hospital Problem list   ARDS due to COVID 19 > improving, mental status/generalized weakness barrier to extubation HCAP> Klebsiella Pressure support as long as tolerated today Lasix again today Full mechanical vent support VAP prevention Daily WUA/SBT baricitinib  Need for sedation for mechanical ventilation> needed frequent fentanyl push on 9/3 Acute metabolic encephalopathy, insomnia Oxycodone > add Prn fentanyl/versed push Add seroquel at night PAD protocol  Hypertension Monitor  DM2 with hyperglycemia SSI Levemir Accuchecks  Long pause/bradycardia Tele Monitor  Epistaxis Afrin prn   Hypothyroidism Synthroid  Depression depakote effexor   Assessment & Plan:    Best practice:  Diet: tube feeding Pain/Anxiety/Delirium protocol (if indicated): as above VAP protocol (if indicated): yes DVT prophylaxis: lovenox GI prophylaxis: pantoprazole Glucose control: SSI/Levemir Mobility: bed rest Code Status: full Family Communication: I called her son Kasandra Knudsen to give him an update Disposition: remain in ICU  Labs   CBC: Recent Labs  Lab 10/27/19 0500 10/27/19 1133 10/28/19 0310 10/28/19 0310 10/28/19 1742 10/29/19 0505 10/29/19 1153 10/30/19 0329 10/31/19 0343  WBC 17.0*  --  16.0*  --   --  15.6*  --  12.3* 13.5*  NEUTROABS  --   --   --   --   --   --   --   --  PENDING  HGB 11.1*   < > 11.7*   < > 11.2* 10.9* 11.6* 10.4* 10.8*  HCT 34.2*   < > 36.5   < > 33.0* 33.8* 34.0* 33.4* 34.4*  MCV 96.3  --  96.6  --   --  95.2  --  96.0 96.9  PLT 303  --  275  --   --  340  --  369 416*   < > = values in this interval not displayed.    Basic Metabolic Panel: Recent Labs  Lab 10/26/19 0528 10/26/19 1712 10/27/19 0500  10/27/19 1133 10/28/19 0310 10/28/19 1742 10/29/19 0349 10/29/19 1153 10/30/19 0329  NA 139   < > 141   < > 140 138 135 138 139  K 3.9   < > 4.1   < > 3.4* 4.2 3.8 5.3* 4.2  CL 106  --  107  --  103  --  98  --  104  CO2 24  --  25  --  26  --  26  --  27  GLUCOSE 131*  --  83  --  134*  --  129*  --  143*  BUN 27*  --  28*  --  29*  --  27*  --  27*  CREATININE 0.50  --  0.53  --  0.52  --  0.41*  --  0.47  CALCIUM 8.8*  --  9.1  --  8.8*  --  8.5*  --  8.8*  MG  --   --   --   --  2.0  --   --   --   --   PHOS  --   --   --   --  2.5  --   --   --   --    < > = values in this interval not displayed.   GFR: Estimated Creatinine Clearance: 73.1 mL/min (by C-G formula based on SCr of 0.47 mg/dL). Recent Labs  Lab 10/28/19 0310 10/29/19 0505 10/30/19 0329 10/31/19 0343  PROCALCITON 0.26  --   --   --   WBC 16.0* 15.6* 12.3* 13.5*    Liver Function Tests: No results for input(s): AST, ALT, ALKPHOS, BILITOT, PROT, ALBUMIN in the last 168 hours. No results for input(s): LIPASE, AMYLASE in the last 168 hours. No results for input(s): AMMONIA in the last 168 hours.  ABG    Component Value Date/Time   PHART 7.497 (H) 10/29/2019 1153   PCO2ART 37.3 10/29/2019 1153   PO2ART 61 (L) 10/29/2019 1153   HCO3 29.0 (H) 10/29/2019 1153   TCO2 30 10/29/2019 1153   ACIDBASEDEF 1.0 10/25/2019 1259   O2SAT 93.0 10/29/2019 1153     Coagulation Profile: No results for input(s): INR, PROTIME in the last 168 hours.  Cardiac Enzymes: No results for input(s): CKTOTAL, CKMB, CKMBINDEX, TROPONINI in the last 168 hours.  HbA1C: Hgb A1c MFr Bld  Date/Time Value Ref Range Status  10/20/2019 04:04 AM 6.6 (H) 4.8 - 5.6 % Final    Comment:    (NOTE) Pre diabetes:          5.7%-6.4%  Diabetes:              >6.4%  Glycemic control for   <7.0% adults with diabetes   10/18/2019 03:53 PM 6.5 (H) 4.8 - 5.6 % Final    Comment:    (NOTE) Pre diabetes:  5.7%-6.4%  Diabetes:               >6.4%  Glycemic control for   <7.0% adults with diabetes     CBG: Recent Labs  Lab 10/30/19 1509 10/30/19 1923 10/30/19 2343 10/31/19 0330 10/31/19 0747  GLUCAP 164* 162* 144* 142* 124*     Critical care time: 35 minutes    Roselie Awkward, MD [place] Pager: [phone] Cell: [phone] If no response, call [phone]

## 2019-10-31 NOTE — Progress Notes (Signed)
Brief Cortrak Note  Cortrak team asked to retrace tube and advance if needed. Pt's Cortrak is in the R nare and has been advanced to 90cm. Cortrak is secured via bridle.   X-ray is required, abdominal x-ray has been ordered by the Cortrak team. Please confirm tube placement before using the Cortrak tube.   Larkin Ina, MS, RD, LDN RD pager number and weekend/on-call pager number located in [place].

## 2019-10-31 NOTE — Progress Notes (Signed)
Nutrition Follow-up  DOCUMENTATION CODES:   Obesity unspecified  INTERVENTION:   Tube Feeding:  Vital 1.5 at 55 ml/hr Pro-Source TF 45 mL QID Provides 2140 kcals, 133 g of protein and 1003 mL of free water Meets 100% estimated calorie and protein needs   NUTRITION DIAGNOSIS:   Inadequate oral intake related to inability to eat as evidenced by NPO status.  Being addressed via TF   GOAL:   Patient will meet greater than or equal to 90% of their needs  Progressing  MONITOR:   Vent status, Labs, Weight trends, TF tolerance, I & O's  REASON FOR ASSESSMENT:   Consult Assessment of nutrition requirement/status, Enteral/tube feeding initiation and management  ASSESSMENT:   69 year old female with PMHx of HTN, asthma, HLD, anxiety, arthritis, depression, GERD, migraines, chronic nausea, fibromyalgia, IBS, hypothyroidism, osteoporosis, iron deficiency anemia who was diagnosed with COVID-19 on 8/19 and now admitted with respiratory failure.  8/20 Intubated 8/25 Cortrak placed (gastric) 9/01 Cortrak tube advanced, not post pyloric? (no xray obtained)  Pt remains on vent support; following commands with improved oxygenation on vent per MD notes, pressure support trials as able  Attempted Cortrak advancement to post pyloric position on 9/01. Per chart note, tube tip at the pylorus and unsuccessful, did not see abd xray however Cortrak team planning to retrace today  Vital High Protein at 60 ml/hr with Pro-Source TF 90 mL daily via Cortrak tube  Current weight 94.9 kg  No pressure injuries per RN skin assessment   Labs: CBGs 137-203 (goal 140-180), Creatinine wdl Meds: colace, ss nvolog, levemir, miralax  Diet Order:   Diet Order    None      EDUCATION NEEDS:   No education needs have been identified at this time  Skin:  Skin Assessment: Reviewed RN Assessment  Last BM:  9/2 rectal tube  Height:   Ht Readings from Last 1 Encounters:  10/22/19 5\' 4"  (1.626 m)     Weight:   Wt Readings from Last 1 Encounters:  10/31/19 94.9 kg    Ideal Body Weight:   (IBW: 55 kg, Adjusted BW: 70 kg)  BMI:  Body mass index is 35.91 kg/m.  Estimated Nutritional Needs:   Kcal:  5956-3875 kcals  Protein:  120-140 g  Fluid:  >/= 2 L/day   Kerman Passey MS, RDN, LDN, CNSC Registered Dietitian III Clinical Nutrition RD Pager and On-Call Pager Number Located in [place]

## 2019-11-01 ENCOUNTER — Inpatient Hospital Stay (HOSPITAL_COMMUNITY): Payer: Medicare Other

## 2019-11-01 LAB — CULTURE, RESPIRATORY W GRAM STAIN

## 2019-11-01 LAB — CULTURE, BLOOD (ROUTINE X 2)
Culture: NO GROWTH
Culture: NO GROWTH
Special Requests: ADEQUATE
Special Requests: ADEQUATE

## 2019-11-01 LAB — GLUCOSE, CAPILLARY
Glucose-Capillary: 156 mg/dL — ABNORMAL HIGH (ref 70–99)
Glucose-Capillary: 141 mg/dL — ABNORMAL HIGH (ref 70–99)
Glucose-Capillary: 142 mg/dL — ABNORMAL HIGH (ref 70–99)
Glucose-Capillary: 212 mg/dL — ABNORMAL HIGH (ref 70–99)
Glucose-Capillary: 182 mg/dL — ABNORMAL HIGH (ref 70–99)

## 2019-11-01 LAB — CBC WITH DIFFERENTIAL/PLATELET
Abs Immature Granulocytes: 0.47 10*3/uL — ABNORMAL HIGH (ref 0.00–0.07)
Basophils Absolute: 0.1 10*3/uL (ref 0.0–0.1)
Basophils Relative: 0 %
Eosinophils Absolute: 0.1 10*3/uL (ref 0.0–0.5)
Eosinophils Relative: 1 %
HCT: 32.8 % — ABNORMAL LOW (ref 36.0–46.0)
Hemoglobin: 10.4 g/dL — ABNORMAL LOW (ref 12.0–15.0)
Immature Granulocytes: 2 %
Lymphocytes Relative: 3 %
Lymphs Abs: 0.7 10*3/uL (ref 0.7–4.0)
MCH: 31.2 pg (ref 26.0–34.0)
MCHC: 31.7 g/dL (ref 30.0–36.0)
MCV: 98.5 fL (ref 80.0–100.0)
Monocytes Absolute: 0.5 10*3/uL (ref 0.1–1.0)
Monocytes Relative: 3 %
Neutro Abs: 18.1 10*3/uL — ABNORMAL HIGH (ref 1.7–7.7)
Neutrophils Relative %: 91 %
Platelets: 389 10*3/uL (ref 150–400)
RBC: 3.33 MIL/uL — ABNORMAL LOW (ref 3.87–5.11)
RDW: 14.2 % (ref 11.5–15.5)
WBC: 19.9 10*3/uL — ABNORMAL HIGH (ref 4.0–10.5)
nRBC: 0 % (ref 0.0–0.2)

## 2019-11-01 LAB — COMPREHENSIVE METABOLIC PANEL
ALT: 32 U/L (ref 0–44)
AST: 27 U/L (ref 15–41)
Albumin: 1.9 g/dL — ABNORMAL LOW (ref 3.5–5.0)
Alkaline Phosphatase: 67 U/L (ref 38–126)
Anion gap: 8 (ref 5–15)
BUN: 26 mg/dL — ABNORMAL HIGH (ref 8–23)
CO2: 27 mmol/L (ref 22–32)
Calcium: 8.8 mg/dL — ABNORMAL LOW (ref 8.9–10.3)
Chloride: 103 mmol/L (ref 98–111)
Creatinine, Ser: 0.48 mg/dL (ref 0.44–1.00)
GFR calc Af Amer: >60 mL/min (ref >60)
GFR calc non Af Amer: >60 mL/min (ref >60)
Glucose, Bld: 178 mg/dL — ABNORMAL HIGH (ref 70–99)
Potassium: 3.9 mmol/L (ref 3.5–5.1)
Sodium: 138 mmol/L (ref 135–145)
Total Bilirubin: 0.7 mg/dL (ref 0.3–1.2)
Total Protein: 5.7 g/dL — ABNORMAL LOW (ref 6.5–8.1)

## 2019-11-01 MED ORDER — NOREPINEPHRINE 4 MG/250ML-% IV SOLN
INTRAVENOUS | Status: AC
  Filled 2019-11-01: qty 250

## 2019-11-01 MED ORDER — MIDAZOLAM HCL 2 MG/2ML IJ SOLN
2.0000 mg | Freq: Once | INTRAMUSCULAR | Status: AC
  Administered 2019-11-01: 2 mg via INTRAVENOUS

## 2019-11-01 MED ORDER — DEXMEDETOMIDINE HCL IN NACL 400 MCG/100ML IV SOLN
0.4000 ug/kg/h | INTRAVENOUS | Status: DC
  Administered 2019-11-01: 0.6 ug/kg/h via INTRAVENOUS
  Administered 2019-11-01: 0.9 ug/kg/h via INTRAVENOUS
  Administered 2019-11-01: 0.4 ug/kg/h via INTRAVENOUS
  Administered 2019-11-02: 0.9 ug/kg/h via INTRAVENOUS
  Administered 2019-11-02 (×2): 0.7 ug/kg/h via INTRAVENOUS
  Administered 2019-11-02: 0.5 ug/kg/h via INTRAVENOUS
  Administered 2019-11-03 – 2019-11-04 (×5): 0.7 ug/kg/h via INTRAVENOUS
  Filled 2019-11-01 (×12): qty 100

## 2019-11-01 MED ORDER — NOREPINEPHRINE 4 MG/250ML-% IV SOLN
0.0000 ug/min | INTRAVENOUS | Status: DC
  Administered 2019-11-01: 5 ug/min via INTRAVENOUS

## 2019-11-01 MED ORDER — SODIUM CHLORIDE 0.9 % IV SOLN
250.0000 mL | INTRAVENOUS | Status: DC
  Administered 2019-11-09: 250 mL via INTRAVENOUS

## 2019-11-01 MED ORDER — ALTEPLASE 2 MG IJ SOLR
2.0000 mg | Freq: Once | INTRAMUSCULAR | Status: AC
  Administered 2019-11-01: 2 mg
  Filled 2019-11-01: qty 2

## 2019-11-01 MED ORDER — SODIUM CHLORIDE 0.9 % IV SOLN
2.0000 g | Freq: Three times a day (TID) | INTRAVENOUS | Status: DC
  Administered 2019-11-01 – 2019-11-03 (×6): 2 g via INTRAVENOUS
  Filled 2019-11-01 (×6): qty 2

## 2019-11-01 MED ORDER — NOREPINEPHRINE 4 MG/250ML-% IV SOLN
2.0000 ug/min | INTRAVENOUS | Status: DC
Start: 2019-11-01 — End: 2019-11-01

## 2019-11-01 MED ORDER — ENOXAPARIN SODIUM 60 MG/0.6ML ~~LOC~~ SOLN
45.0000 mg | SUBCUTANEOUS | Status: DC
  Administered 2019-11-01 – 2019-11-07 (×7): 45 mg via SUBCUTANEOUS
  Filled 2019-11-01 (×5): qty 0.6
  Filled 2019-11-01: qty 0.45
  Filled 2019-11-01 (×2): qty 0.6

## 2019-11-01 NOTE — Progress Notes (Addendum)
NAME:  Jacqueline Lucas, MRN:  [id number], DOB:  Aug 21, 1950, LOS: 60 ADMISSION DATE:  10/28/2019, CONSULTATION DATE:  8/20 REFERRING MD:  EDP, CHIEF COMPLAINT:  Dyspnea   Brief History   69 y/o female admitted on 8/19 with severe acute respiratory failure with hypoxemia due to COVID 19 pneumonia.  Past Medical History  Asthma Obesity Hypertension Hyperlipidemia Degenerative disk disease Depression Fibromyalgia Heart murmur GERD Hypothyroidism IDA Insomnia Fe def anemia  Significant Hospital Events   8/20 Admit to [facility] 8/21 failed SBT, oxygenation worsened thorought the day 9/3 awake, following commands, weaning PSV 12/10  Consults:   Procedures:  8/20 ETT >> 8/22 R PICC  >>  Significant Diagnostic Tests:  8/26 LE doppler > negative for DVT 8/27 TTE> LVEF 65-70%, Grade I DD, Mitral valve normal, RV normal size/function  Micro Data:  8/20 SARS COV 2>>positive 8/20 UC>> neg 8/20 BCx2>> neg 8/24 BCx2 >> neg 8/24 respiratory culture >> Klebsiella 8/30 BCx 2 >> 9/1 trach asp >>klebsiella  Antimicrobials:  8/20 Remdesivir >8/24 8/20 baricitinib >> 9/2 8/20 solumedrol > taper started 8/27 >> 8/24 cefepime >>8/31  Interim history/subjective:   On weaning trial this AM but more confused and not following commands like yesterday.  Some agitation on the vent.  Posturing movements of upper extremities noted. CXR 9/4 showing ET tube 5cm above carina.  Objective   Blood pressure 135/63, pulse 90, temperature 99.2 F (37.3 C), temperature source Axillary, resp. rate (!) 27, height 5\' 4"  (1.626 m), weight 92.8 kg, SpO2 93 %.    Vent Mode: PRVC FiO2 (%):  [40 %-60 %] 60 % Set Rate:  [28 bmp] 28 bmp Vt Set:  [440 mL] 440 mL PEEP:  [10 cmH20] 10 cmH20 Plateau Pressure:  [28 cmH20-34 cmH20] 34 cmH20   Intake/Output Summary (Last 24 hours) at 11/01/2019 1109 Last data filed at 11/01/2019 0800 Gross per 24 hour  Intake 1299.93 ml  Output 2110 ml  Net -810.07 ml    Filed Weights   10/30/19 0354 10/31/19 0500 11/01/19 0407  Weight: 95.4 kg 94.9 kg 92.8 kg    Examination:  General:  Intubated, coughing, alert to verbal stimuli HENT: NCAT ETT in place PULM: course breath sounds. No wheezing or rhonchi CV: RRR, no mgr GI: BS+, soft, nontender MSK: normal bulk and tone Neuro: alert to verbal stimuli, non-purposeful movements. Occasional posturing of upper extremities.   Resolved Hospital Problem list    Assessment & Plan:   ARDS due to COVID 19 > improving, mental status/generalized weakness barrier to extubation HCAP> Klebsiella Pressure support as long as tolerated today, will likely switch back to [facility] as patient appears tired Full mechanical vent support VAP prevention Daily WUA/SBT baricitinib Check respiratory culture for thick secretions Will restart cefepime for klebsiella coverage based on 9/1 tracheal aspirate  Need for sedation for mechanical ventilation> needed frequent fentanyl push on 9/3 Acute metabolic encephalopathy, insomnia Oxycodone 5mg  q8hrs per tube Prn fentanyl/versed push Add seroquel at night PAD protocol Start precedex today in order to wean down on fentanyl drip  Hypertension Monitor  DM2 with hyperglycemia SSI Levemir Accuchecks  Long pause/bradycardia Tele Monitor  Epistaxis Afrin prn   Hypothyroidism Synthroid  Depression depakote effexor  Best practice:  Diet: tube feeding Pain/Anxiety/Delirium protocol (if indicated): as above VAP protocol (if indicated): yes DVT prophylaxis: lovenox GI prophylaxis: pantoprazole Glucose control: SSI/Levemir Mobility: bed rest Code Status: full Family Communication: Will call family to give update Disposition: remain in ICU  Labs  CBC: Recent Labs  Lab 10/28/19 0310 10/28/19 1742 10/29/19 0505 10/29/19 1153 10/30/19 0329 10/31/19 0343 11/01/19 0402  WBC 16.0*  --  15.6*  --  12.3* 13.5* 19.9*  NEUTROABS  --   --   --   --   --   10.9* 18.1*  HGB 11.7*   < > 10.9* 11.6* 10.4* 10.8* 10.4*  HCT 36.5   < > 33.8* 34.0* 33.4* 34.4* 32.8*  MCV 96.6  --  95.2  --  96.0 96.9 98.5  PLT 275  --  340  --  369 416* 389   < > = values in this interval not displayed.    Basic Metabolic Panel: Recent Labs  Lab 10/28/19 0310 10/28/19 1742 10/29/19 0349 10/29/19 1153 10/30/19 0329 10/31/19 0343 11/01/19 0402  NA 140   < > 135 138 139 139 138  K 3.4*   < > 3.8 5.3* 4.2 4.0 3.9  CL 103  --  98  --  104 103 103  CO2 26  --  26  --  27 26 27   GLUCOSE 134*  --  129*  --  143* 144* 178*  BUN 29*  --  27*  --  27* 30* 26*  CREATININE 0.52  --  0.41*  --  0.47 0.48 0.48  CALCIUM 8.8*  --  8.5*  --  8.8* 9.0 8.8*  MG 2.0  --   --   --   --   --   --   PHOS 2.5  --   --   --   --   --   --    < > = values in this interval not displayed.   GFR: Estimated Creatinine Clearance: 72.2 mL/min (by C-G formula based on SCr of 0.48 mg/dL). Recent Labs  Lab 10/28/19 0310 10/28/19 0310 10/29/19 0505 10/30/19 0329 10/31/19 0343 11/01/19 0402  PROCALCITON 0.26  --   --   --   --   --   WBC 16.0*   < > 15.6* 12.3* 13.5* 19.9*   < > = values in this interval not displayed.    Liver Function Tests: Recent Labs  Lab 10/31/19 0343 11/01/19 0402  AST 39 27  ALT 35 32  ALKPHOS 60 67  BILITOT 0.5 0.7  PROT 5.8* 5.7*  ALBUMIN 1.9* 1.9*   No results for input(s): LIPASE, AMYLASE in the last 168 hours. No results for input(s): AMMONIA in the last 168 hours.  ABG    Component Value Date/Time   PHART 7.497 (H) 10/29/2019 1153   PCO2ART 37.3 10/29/2019 1153   PO2ART 61 (L) 10/29/2019 1153   HCO3 29.0 (H) 10/29/2019 1153   TCO2 30 10/29/2019 1153   ACIDBASEDEF 1.0 10/25/2019 1259   O2SAT 93.0 10/29/2019 1153     Coagulation Profile: No results for input(s): INR, PROTIME in the last 168 hours.  Cardiac Enzymes: No results for input(s): CKTOTAL, CKMB, CKMBINDEX, TROPONINI in the last 168 hours.  HbA1C: Hgb A1c MFr Bld   Date/Time Value Ref Range Status  10/20/2019 04:04 AM 6.6 (H) 4.8 - 5.6 % Final    Comment:    (NOTE) Pre diabetes:          5.7%-6.4%  Diabetes:              >6.4%  Glycemic control for   <7.0% adults with diabetes   10/18/2019 03:53 PM 6.5 (H) 4.8 - 5.6 % Final    Comment:    (  NOTE) Pre diabetes:          5.7%-6.4%  Diabetes:              >6.4%  Glycemic control for   <7.0% adults with diabetes     CBG: Recent Labs  Lab 10/31/19 1524 10/31/19 1928 10/31/19 2331 11/01/19 0333 11/01/19 0805  GLUCAP 130* 202* 148* 156* 141*     Critical care time: 35 minutes    Freda Jackson, MD [place] [facility] Office: [phone]   See [place] for Pager Details   Addendum: For the encephalopathy and new posturing we will check a CT head without contrast

## 2019-11-02 LAB — CBC
HCT: 29.8 % — ABNORMAL LOW (ref 36.0–46.0)
Hemoglobin: 9.1 g/dL — ABNORMAL LOW (ref 12.0–15.0)
MCH: 30.6 pg (ref 26.0–34.0)
MCHC: 30.5 g/dL (ref 30.0–36.0)
MCV: 100.3 fL — ABNORMAL HIGH (ref 80.0–100.0)
Platelets: 348 10*3/uL (ref 150–400)
RBC: 2.97 MIL/uL — ABNORMAL LOW (ref 3.87–5.11)
RDW: 14.3 % (ref 11.5–15.5)
WBC: 13.3 10*3/uL — ABNORMAL HIGH (ref 4.0–10.5)
nRBC: 0 % (ref 0.0–0.2)

## 2019-11-02 LAB — GLUCOSE, CAPILLARY
Glucose-Capillary: 131 mg/dL — ABNORMAL HIGH (ref 70–99)
Glucose-Capillary: 155 mg/dL — ABNORMAL HIGH (ref 70–99)
Glucose-Capillary: 158 mg/dL — ABNORMAL HIGH (ref 70–99)
Glucose-Capillary: 166 mg/dL — ABNORMAL HIGH (ref 70–99)
Glucose-Capillary: 166 mg/dL — ABNORMAL HIGH (ref 70–99)
Glucose-Capillary: 168 mg/dL — ABNORMAL HIGH (ref 70–99)
Glucose-Capillary: 186 mg/dL — ABNORMAL HIGH (ref 70–99)

## 2019-11-02 LAB — BASIC METABOLIC PANEL
Anion gap: 7 (ref 5–15)
BUN: 28 mg/dL — ABNORMAL HIGH (ref 8–23)
CO2: 26 mmol/L (ref 22–32)
Calcium: 8.1 mg/dL — ABNORMAL LOW (ref 8.9–10.3)
Chloride: 109 mmol/L (ref 98–111)
Creatinine, Ser: 0.47 mg/dL (ref 0.44–1.00)
GFR calc Af Amer: >60 mL/min (ref >60)
GFR calc non Af Amer: >60 mL/min (ref >60)
Glucose, Bld: 185 mg/dL — ABNORMAL HIGH (ref 70–99)
Potassium: 4.2 mmol/L (ref 3.5–5.1)
Sodium: 142 mmol/L (ref 135–145)

## 2019-11-02 LAB — TRIGLYCERIDES: Triglycerides: 69 mg/dL (ref <150)

## 2019-11-02 MED ORDER — GABAPENTIN 250 MG/5ML PO SOLN
300.0000 mg | Freq: Every day | ORAL | Status: DC
  Administered 2019-11-02 – 2019-11-11 (×10): 300 mg
  Filled 2019-11-02 (×12): qty 6

## 2019-11-02 MED ORDER — LINEZOLID 600 MG/300ML IV SOLN: 600.0000 mg | Freq: Two times a day (BID) | INTRAVENOUS | Status: DC

## 2019-11-02 NOTE — Progress Notes (Signed)
NAME:  Jacqueline Lucas, MRN:  [id number], DOB:  October 26, 1950, LOS: 57 ADMISSION DATE:  10/02/2019, CONSULTATION DATE:  8/20 REFERRING MD:  EDP, CHIEF COMPLAINT:  Dyspnea   Brief History   69 y/o female admitted on 8/19 with severe acute respiratory failure with hypoxemia due to COVID 19 pneumonia.  Past Medical History  Asthma Obesity Hypertension Hyperlipidemia Degenerative disk disease Depression Fibromyalgia Heart murmur GERD Hypothyroidism IDA Insomnia Fe def anemia  Significant Hospital Events   8/20 Admit to [facility] 8/21 failed SBT, oxygenation worsened thorought the day 9/3 awake, following commands, weaning PSV 12/10 9/4 changing abx to zosyn due to worsening resp secretions despite ampicillin   Consults:    Procedures:  8/20 ETT >> 8/22 R PICC  >>  Significant Diagnostic Tests:  8/26 LE doppler > negative for DVT 8/27 TTE> LVEF 65-70%, Grade I DD, Mitral valve normal, RV normal size/function  Micro Data:  8/20 SARS COV 2>>positive 8/20 UC>> neg 8/20 BCx2>> neg 8/24 BCx2 >> neg 8/24 respiratory culture >> Klebsiella 8/30 BCx 2 >> 9/1 trach asp >>klebsiella  Antimicrobials:  8/20 Remdesivir >8/24 8/20 baricitinib >> 9/2 8/20 solumedrol > taper started 8/27 >> 8/24 cefepime >>8/31  Interim history/subjective:  NAEO -- patient had I/O cath  This morning remains sedated, on mechanical ventilator No AM labs have resulted for review   Objective   Blood pressure (!) 106/48, pulse 67, temperature 99.3 F (37.4 C), temperature source Axillary, resp. rate 13, height 5\' 4"  (1.626 m), weight 93.3 kg, SpO2 (!) 89 %.    Vent Mode: PRVC FiO2 (%):  [50 %-70 %] 60 % Set Rate:  [28 bmp] 28 bmp Vt Set:  [440 mL] 440 mL PEEP:  [10 cmH20] 10 cmH20 Plateau Pressure:  [26 cmH20-34 cmH20] 29 cmH20   Intake/Output Summary (Last 24 hours) at 11/02/2019 1214 Last data filed at 11/02/2019 1007 Gross per 24 hour  Intake 1810.44 ml  Output 1050 ml  Net 760.44 ml    Filed Weights   10/31/19 0500 11/01/19 0407 11/02/19 0500  Weight: 94.9 kg 92.8 kg 93.3 kg    Examination:  General:  Critically ill older adult F, sedated intubated NAD, intermittently coughing  HENT: NCAT ETT secure trachea midline  PULM: Mechanically ventilated, symmetrical chest expansion, coarse bilat  CV: RRR s1s2 no rgm cap refill < 3 sec  GI: Soft round  + bowel sounds  MSK:  No obvious joint deformity, symmetrical bulk and tone. BLE edema  Neuro: Opens eyes spontaneously, sedated    Resolved Hospital Problem list    Assessment & Plan:   ARDS due to COVID 19 > improving, mental status/generalized weakness barrier to extubation HCAP> Klebsiella Full mechanical vent support VAP prevention Daily WUA/SBT baricitinib Cefepime given prior klebsiella -- continue to f/u trach aspirate cx 9/4   Need for sedation for mechanical ventilation\ Acute metabolic encephalopathy, insomnia Oxycodone 5mg  q8hrs per tube Prn fentanyl/versed push qHS seroquel  PAD protocol Precedex, wean fent as able   Hypertension Monitor  DM2 with hyperglycemia SSI Levemir Accuchecks  Long pause/bradycardia -- improved  ICU monitoring   Epistaxis Afrin prn    Hypothyroidism Synthroid  Depression depakote effexor  Best practice:  Diet: tube feeding Pain/Anxiety/Delirium protocol (if indicated): as above VAP protocol (if indicated): yes DVT prophylaxis: lovenox GI prophylaxis: pantoprazole Glucose control: SSI/Levemir Mobility: bed rest Code Status: full Family Communication: pending 9/5 Disposition: ICU   Labs   CBC: Recent Labs  Lab 10/28/19 0310 10/28/19 1742 10/29/19 0505  10/29/19 1153 10/30/19 0329 10/31/19 0343 11/01/19 0402  WBC 16.0*  --  15.6*  --  12.3* 13.5* 19.9*  NEUTROABS  --   --   --   --   --  10.9* 18.1*  HGB 11.7*   < > 10.9* 11.6* 10.4* 10.8* 10.4*  HCT 36.5   < > 33.8* 34.0* 33.4* 34.4* 32.8*  MCV 96.6  --  95.2  --  96.0 96.9 98.5  PLT  275  --  340  --  369 416* 389   < > = values in this interval not displayed.    Basic Metabolic Panel: Recent Labs  Lab 10/28/19 0310 10/28/19 1742 10/29/19 0349 10/29/19 1153 10/30/19 0329 10/31/19 0343 11/01/19 0402  NA 140   < > 135 138 139 139 138  K 3.4*   < > 3.8 5.3* 4.2 4.0 3.9  CL 103  --  98  --  104 103 103  CO2 26  --  26  --  27 26 27   GLUCOSE 134*  --  129*  --  143* 144* 178*  BUN 29*  --  27*  --  27* 30* 26*  CREATININE 0.52  --  0.41*  --  0.47 0.48 0.48  CALCIUM 8.8*  --  8.5*  --  8.8* 9.0 8.8*  MG 2.0  --   --   --   --   --   --   PHOS 2.5  --   --   --   --   --   --    < > = values in this interval not displayed.   GFR: Estimated Creatinine Clearance: 72.4 mL/min (by C-G formula based on SCr of 0.48 mg/dL). Recent Labs  Lab 10/28/19 0310 10/28/19 0310 10/29/19 0505 10/30/19 0329 10/31/19 0343 11/01/19 0402  PROCALCITON 0.26  --   --   --   --   --   WBC 16.0*   < > 15.6* 12.3* 13.5* 19.9*   < > = values in this interval not displayed.    Liver Function Tests: Recent Labs  Lab 10/31/19 0343 11/01/19 0402  AST 39 27  ALT 35 32  ALKPHOS 60 67  BILITOT 0.5 0.7  PROT 5.8* 5.7*  ALBUMIN 1.9* 1.9*   No results for input(s): LIPASE, AMYLASE in the last 168 hours. No results for input(s): AMMONIA in the last 168 hours.  ABG    Component Value Date/Time   PHART 7.497 (H) 10/29/2019 1153   PCO2ART 37.3 10/29/2019 1153   PO2ART 61 (L) 10/29/2019 1153   HCO3 29.0 (H) 10/29/2019 1153   TCO2 30 10/29/2019 1153   ACIDBASEDEF 1.0 10/25/2019 1259   O2SAT 93.0 10/29/2019 1153     Coagulation Profile: No results for input(s): INR, PROTIME in the last 168 hours.  Cardiac Enzymes: No results for input(s): CKTOTAL, CKMB, CKMBINDEX, TROPONINI in the last 168 hours.  HbA1C: Hgb A1c MFr Bld  Date/Time Value Ref Range Status  10/20/2019 04:04 AM 6.6 (H) 4.8 - 5.6 % Final    Comment:    (NOTE) Pre diabetes:          5.7%-6.4%  Diabetes:               >6.4%  Glycemic control for   <7.0% adults with diabetes   10/18/2019 03:53 PM 6.5 (H) 4.8 - 5.6 % Final    Comment:    (NOTE) Pre diabetes:  5.7%-6.4%  Diabetes:              >6.4%  Glycemic control for   <7.0% adults with diabetes     CBG: Recent Labs  Lab 11/01/19 1935 11/02/19 0037 11/02/19 0331 11/02/19 0809 11/02/19 1157  GLUCAP 182* 186* 158* 155* 131*     CRITICAL CARE Performed by: Cristal Generous   Total critical care time: 35 minutes  Critical care time was exclusive of separately billable procedures and treating other patients. Critical care was necessary to treat or prevent imminent or life-threatening deterioration.  Critical care was time spent personally by me on the following activities: development of treatment plan with patient and/or surrogate as well as nursing, discussions with consultants, evaluation of patient's response to treatment, examination of patient, obtaining history from patient or surrogate, ordering and performing treatments and interventions, ordering and review of laboratory studies, ordering and review of radiographic studies, pulse oximetry and re-evaluation of patient's condition.  Eliseo Gum MSN, AGACNP-BC [place] [phone] If no answer, [phone] 11/02/2019, 12:14 PM

## 2019-11-02 NOTE — Progress Notes (Signed)
[facility] Progress Note Patient Name: Jacqueline Lucas DOB: [birth year] MRN: [id number]   Date of Service  11/02/2019  HPI/Events of Note  Patient needs order for bladder cath.  eICU Interventions  Order entered for bladder cath.        [name] 11/02/2019, 4:05 AM

## 2019-11-02 NOTE — Progress Notes (Signed)
Son Kasandra Knudsen updated. All questions answered.    Eliseo Gum MSN, AGACNP-BC [place] [phone] If no answer, [phone] 11/02/2019, 3:33 PM

## 2019-11-03 ENCOUNTER — Inpatient Hospital Stay (HOSPITAL_COMMUNITY): Payer: Medicare Other

## 2019-11-03 LAB — GLUCOSE, CAPILLARY
Glucose-Capillary: 133 mg/dL — ABNORMAL HIGH (ref 70–99)
Glucose-Capillary: 148 mg/dL — ABNORMAL HIGH (ref 70–99)
Glucose-Capillary: 161 mg/dL — ABNORMAL HIGH (ref 70–99)
Glucose-Capillary: 169 mg/dL — ABNORMAL HIGH (ref 70–99)
Glucose-Capillary: 171 mg/dL — ABNORMAL HIGH (ref 70–99)
Glucose-Capillary: 182 mg/dL — ABNORMAL HIGH (ref 70–99)

## 2019-11-03 LAB — BASIC METABOLIC PANEL
Anion gap: 7 (ref 5–15)
BUN: 26 mg/dL — ABNORMAL HIGH (ref 8–23)
CO2: 25 mmol/L (ref 22–32)
Calcium: 8.2 mg/dL — ABNORMAL LOW (ref 8.9–10.3)
Chloride: 109 mmol/L (ref 98–111)
Creatinine, Ser: 0.46 mg/dL (ref 0.44–1.00)
GFR calc Af Amer: >60 mL/min (ref >60)
GFR calc non Af Amer: >60 mL/min (ref >60)
Glucose, Bld: 159 mg/dL — ABNORMAL HIGH (ref 70–99)
Potassium: 4.2 mmol/L (ref 3.5–5.1)
Sodium: 141 mmol/L (ref 135–145)

## 2019-11-03 LAB — CBC
HCT: 26.9 % — ABNORMAL LOW (ref 36.0–46.0)
Hemoglobin: 8.2 g/dL — ABNORMAL LOW (ref 12.0–15.0)
MCH: 30.8 pg (ref 26.0–34.0)
MCHC: 30.5 g/dL (ref 30.0–36.0)
MCV: 101.1 fL — ABNORMAL HIGH (ref 80.0–100.0)
Platelets: 295 10*3/uL (ref 150–400)
RBC: 2.66 MIL/uL — ABNORMAL LOW (ref 3.87–5.11)
RDW: 14.4 % (ref 11.5–15.5)
WBC: 9.3 10*3/uL (ref 4.0–10.5)
nRBC: 0 % (ref 0.0–0.2)

## 2019-11-03 MED ORDER — SODIUM CHLORIDE 0.9 % IV SOLN
2.0000 g | Freq: Three times a day (TID) | INTRAVENOUS | Status: DC
  Administered 2019-11-03 – 2019-11-04 (×3): 2 g via INTRAVENOUS
  Filled 2019-11-03 (×3): qty 2

## 2019-11-03 MED ORDER — OXYCODONE HCL 5 MG PO TABS
10.0000 mg | ORAL_TABLET | Freq: Three times a day (TID) | ORAL | Status: DC
  Administered 2019-11-03 – 2019-11-12 (×28): 10 mg
  Filled 2019-11-03 (×29): qty 2

## 2019-11-03 MED ORDER — CLONAZEPAM 0.5 MG PO TBDP
0.5000 mg | ORAL_TABLET | Freq: Two times a day (BID) | ORAL | Status: DC
  Administered 2019-11-03 – 2019-11-05 (×5): 0.5 mg
  Filled 2019-11-03 (×5): qty 1

## 2019-11-03 MED ORDER — CEFAZOLIN SODIUM-DEXTROSE 1-4 GM/50ML-% IV SOLN: 1.0000 g | Freq: Three times a day (TID) | INTRAVENOUS | Status: DC

## 2019-11-03 MED ORDER — CLONAZEPAM 0.1 MG/ML ORAL SUSPENSION
0.5000 mg | Freq: Two times a day (BID) | ORAL | Status: DC
  Filled 2019-11-03 (×2): qty 5

## 2019-11-03 NOTE — Progress Notes (Signed)
Patient's son, Kasandra Knudsen, called and updated on clinical status. He is aware of plan to likely proceed with tracheostomy in the next 24-48 hours and is agreeable.   Raney Antwine DO, DO IMTS, PGY-3 11/03/19, 4:15 PM

## 2019-11-03 NOTE — Plan of Care (Signed)

## 2019-11-03 NOTE — Progress Notes (Signed)
NAME:  Jacqueline Lucas, MRN:  [id number], DOB:  02-Mar-1950, LOS: 36 ADMISSION DATE:  10/25/2019, CONSULTATION DATE:  8/20 REFERRING MD:  EDP, CHIEF COMPLAINT:  Dyspnea   Brief History   69 y/o female admitted on 8/19 with severe acute respiratory failure with hypoxemia due to COVID 19 pneumonia.  Past Medical History  Asthma Obesity Hypertension Hyperlipidemia Degenerative disk disease Depression Fibromyalgia Heart murmur GERD Hypothyroidism IDA Insomnia Fe def anemia  Significant Hospital Events   8/20 Admit to [facility] 8/21 failed SBT, oxygenation worsened thorought the day 9/3 awake, following commands, weaning PSV 12/10 9/4 changing abx to zosyn due to worsening resp secretions despite ampicillin   Consults:    Procedures:  8/20 ETT >> 8/22 R PICC  >>  Significant Diagnostic Tests:  8/26 LE doppler > negative for DVT 8/27 TTE> LVEF 65-70%, Grade I DD, Mitral valve normal, RV normal size/function 9/4 CT head > no evidence of acute intracranial abnormality   Micro Data:  8/20 SARS COV 2>>positive 8/20 UC>> neg 8/20 BCx2>> neg 8/24 BCx2 >> neg 8/24 respiratory culture >> Klebsiella 8/30 BCx 2 >> 9/1 trach asp >>klebsiella 9/4 trach asp >> Staph aureus  Antimicrobials:  8/20 Remdesivir >8/24 8/20 baricitinib >> 9/2 8/20 solumedrol > taper started 8/27 >> 8/24 cefepime >>8/31 9/4 cefepime >>   Interim history/subjective:   RT reports a lot of thick secretions. Weaned off sedation. Able to track to voice and follow basic commands.    Objective   Blood pressure 113/71, pulse 72, temperature 98.6 F (37 C), temperature source Axillary, resp. rate 16, height 5\' 4"  (1.626 m), weight 95.6 kg, SpO2 93 %.    Vent Mode: PRVC FiO2 (%):  [50 %] 50 % Set Rate:  [28 bmp] 28 bmp Vt Set:  [440 mL] 440 mL PEEP:  [10 cmH20] 10 cmH20 Plateau Pressure:  [20 cmH20-29 cmH20] 28 cmH20   Intake/Output Summary (Last 24 hours) at 11/03/2019 1501 Last data filed at  11/03/2019 1420 Gross per 24 hour  Intake 2447.17 ml  Output 1305 ml  Net 1142.17 ml   Filed Weights   11/01/19 0407 11/02/19 0500 11/03/19 0500  Weight: 92.8 kg 93.3 kg 95.6 kg    Examination:  General: ill-appearing, intubated, NAD HENT: ETT in place   PULM: Mechanically ventilated, symmetrical chest expansion, coarse breath sounds throughout  CV: RRR  GI: soft, non-tender, non-distended  MSK:  Diffusely edematous  Neuro: tracks to voice, follows some commands   Resolved Hospital Problem list    Assessment & Plan:   ARDS due to COVID 19  HCAP> Klebsiella, Staph aureus  -continues to require full mechanical vent support with minimal ability to wean -will likely require transition to tracheostomy in the next 1-2 days  -attempt to wean sedation as able  -VAP prevention -Daily WUA/SBT -baricitinib -Cefepime for HCAP   Need for sedation for mechanical ventilation\ Acute metabolic encephalopathy, insomnia -discontinue fentanyl, precedex -Increase Oxycodone to 10 mg q8hrs per tube; add klonopin 0.5 mg BID -qHS seroquel   DM2 with hyperglycemia -CBGs well controlled on current regimen   Hypothyroidism -Synthroid  Depression -depakote -effexor  Best practice:  Diet: tube feeding Pain/Anxiety/Delirium protocol (if indicated): as above VAP protocol (if indicated): yes DVT prophylaxis: lovenox GI prophylaxis: pantoprazole Glucose control: SSI/Levemir Mobility: bed rest Code Status: full Family Communication: will update son  Disposition: ICU   Labs   CBC: Recent Labs  Lab 10/30/19 0329 10/31/19 0343 11/01/19 0402 11/02/19 1638 11/03/19 0401  WBC  12.3* 13.5* 19.9* 13.3* 9.3  NEUTROABS  --  10.9* 18.1*  --   --   HGB 10.4* 10.8* 10.4* 9.1* 8.2*  HCT 33.4* 34.4* 32.8* 29.8* 26.9*  MCV 96.0 96.9 98.5 100.3* 101.1*  PLT 369 416* 389 348 902    Basic Metabolic Panel: Recent Labs  Lab 10/28/19 0310 10/28/19 1742 10/30/19 0329 10/31/19 0343  11/01/19 0402 11/02/19 1852 11/03/19 0401  NA 140   < > 139 139 138 142 141  K 3.4*   < > 4.2 4.0 3.9 4.2 4.2  CL 103   < > 104 103 103 109 109  CO2 26   < > 27 26 27 26 25   GLUCOSE 134*   < > 143* 144* 178* 185* 159*  BUN 29*   < > 27* 30* 26* 28* 26*  CREATININE 0.52   < > 0.47 0.48 0.48 0.47 0.46  CALCIUM 8.8*   < > 8.8* 9.0 8.8* 8.1* 8.2*  MG 2.0  --   --   --   --   --   --   PHOS 2.5  --   --   --   --   --   --    < > = values in this interval not displayed.   GFR: Estimated Creatinine Clearance: 73.4 mL/min (by C-G formula based on SCr of 0.46 mg/dL). Recent Labs  Lab 10/28/19 0310 10/29/19 0505 10/31/19 0343 11/01/19 0402 11/02/19 1638 11/03/19 0401  PROCALCITON 0.26  --   --   --   --   --   WBC 16.0*   < > 13.5* 19.9* 13.3* 9.3   < > = values in this interval not displayed.    Liver Function Tests: Recent Labs  Lab 10/31/19 0343 11/01/19 0402  AST 39 27  ALT 35 32  ALKPHOS 60 67  BILITOT 0.5 0.7  PROT 5.8* 5.7*  ALBUMIN 1.9* 1.9*   No results for input(s): LIPASE, AMYLASE in the last 168 hours. No results for input(s): AMMONIA in the last 168 hours.  ABG    Component Value Date/Time   PHART 7.497 (H) 10/29/2019 1153   PCO2ART 37.3 10/29/2019 1153   PO2ART 61 (L) 10/29/2019 1153   HCO3 29.0 (H) 10/29/2019 1153   TCO2 30 10/29/2019 1153   ACIDBASEDEF 1.0 10/25/2019 1259   O2SAT 93.0 10/29/2019 1153     Coagulation Profile: No results for input(s): INR, PROTIME in the last 168 hours.  Cardiac Enzymes: No results for input(s): CKTOTAL, CKMB, CKMBINDEX, TROPONINI in the last 168 hours.  HbA1C: Hgb A1c MFr Bld  Date/Time Value Ref Range Status  10/20/2019 04:04 AM 6.6 (H) 4.8 - 5.6 % Final    Comment:    (NOTE) Pre diabetes:          5.7%-6.4%  Diabetes:              >6.4%  Glycemic control for   <7.0% adults with diabetes   10/18/2019 03:53 PM 6.5 (H) 4.8 - 5.6 % Final    Comment:    (NOTE) Pre diabetes:           5.7%-6.4%  Diabetes:              >6.4%  Glycemic control for   <7.0% adults with diabetes     CBG: Recent Labs  Lab 11/02/19 1948 11/02/19 2329 11/03/19 0339 11/03/19 0758 11/03/19 1148  GLUCAP 166* [place]     [address] [place]  D, DO PGY-3 11/03/19, 3:30 PM

## 2019-11-04 LAB — CBC WITH DIFFERENTIAL/PLATELET
Abs Immature Granulocytes: 0.29 10*3/uL — ABNORMAL HIGH (ref 0.00–0.07)
Basophils Absolute: 0 10*3/uL (ref 0.0–0.1)
Basophils Relative: 1 %
Eosinophils Absolute: 0.2 10*3/uL (ref 0.0–0.5)
Eosinophils Relative: 3 %
HCT: 27.6 % — ABNORMAL LOW (ref 36.0–46.0)
Hemoglobin: 8.5 g/dL — ABNORMAL LOW (ref 12.0–15.0)
Immature Granulocytes: 4 %
Lymphocytes Relative: 9 %
Lymphs Abs: 0.6 10*3/uL — ABNORMAL LOW (ref 0.7–4.0)
MCH: 31.3 pg (ref 26.0–34.0)
MCHC: 30.8 g/dL (ref 30.0–36.0)
MCV: 101.5 fL — ABNORMAL HIGH (ref 80.0–100.0)
Monocytes Absolute: 0.4 10*3/uL (ref 0.1–1.0)
Monocytes Relative: 5 %
Neutro Abs: 5.6 10*3/uL (ref 1.7–7.7)
Neutrophils Relative %: 78 %
Platelets: 327 10*3/uL (ref 150–400)
RBC: 2.72 MIL/uL — ABNORMAL LOW (ref 3.87–5.11)
RDW: 14.2 % (ref 11.5–15.5)
WBC: 7.2 10*3/uL (ref 4.0–10.5)
nRBC: 0.4 % — ABNORMAL HIGH (ref 0.0–0.2)

## 2019-11-04 LAB — BASIC METABOLIC PANEL
Anion gap: 8 (ref 5–15)
BUN: 22 mg/dL (ref 8–23)
CO2: 26 mmol/L (ref 22–32)
Calcium: 8.4 mg/dL — ABNORMAL LOW (ref 8.9–10.3)
Chloride: 108 mmol/L (ref 98–111)
Creatinine, Ser: 0.45 mg/dL (ref 0.44–1.00)
GFR calc Af Amer: >60 mL/min (ref >60)
GFR calc non Af Amer: >60 mL/min (ref >60)
Glucose, Bld: 164 mg/dL — ABNORMAL HIGH (ref 70–99)
Potassium: 4.1 mmol/L (ref 3.5–5.1)
Sodium: 142 mmol/L (ref 135–145)

## 2019-11-04 LAB — CULTURE, RESPIRATORY W GRAM STAIN

## 2019-11-04 LAB — D-DIMER, QUANTITATIVE: D-Dimer, Quant: 3.97 ug/mL-FEU — ABNORMAL HIGH (ref 0.00–0.50)

## 2019-11-04 LAB — GLUCOSE, CAPILLARY
Glucose-Capillary: 148 mg/dL — ABNORMAL HIGH (ref 70–99)
Glucose-Capillary: 163 mg/dL — ABNORMAL HIGH (ref 70–99)
Glucose-Capillary: 153 mg/dL — ABNORMAL HIGH (ref 70–99)
Glucose-Capillary: 126 mg/dL — ABNORMAL HIGH (ref 70–99)
Glucose-Capillary: 146 mg/dL — ABNORMAL HIGH (ref 70–99)
Glucose-Capillary: 156 mg/dL — ABNORMAL HIGH (ref 70–99)

## 2019-11-04 MED ORDER — DEXMEDETOMIDINE HCL IN NACL 400 MCG/100ML IV SOLN
0.0000 ug/kg/h | INTRAVENOUS | Status: DC
  Administered 2019-11-04 (×2): 0.7 ug/kg/h via INTRAVENOUS
  Administered 2019-11-05: 0.8 ug/kg/h via INTRAVENOUS
  Administered 2019-11-05 – 2019-11-06 (×2): 0.7 ug/kg/h via INTRAVENOUS
  Administered 2019-11-06 (×2): 0.8 ug/kg/h via INTRAVENOUS
  Administered 2019-11-06: 0.7 ug/kg/h via INTRAVENOUS
  Administered 2019-11-07 (×3): 1 ug/kg/h via INTRAVENOUS
  Filled 2019-11-04: qty 100
  Filled 2019-11-04: qty 200
  Filled 2019-11-04 (×10): qty 100

## 2019-11-04 MED ORDER — FUROSEMIDE 10 MG/ML IJ SOLN
40.0000 mg | Freq: Once | INTRAMUSCULAR | Status: AC
  Administered 2019-11-04: 40 mg via INTRAVENOUS
  Filled 2019-11-04: qty 4

## 2019-11-04 MED ORDER — SODIUM CHLORIDE 0.9 % IV SOLN
2.0000 g | INTRAVENOUS | Status: AC
  Administered 2019-11-04 – 2019-11-09 (×6): 2 g via INTRAVENOUS
  Filled 2019-11-04 (×6): qty 20

## 2019-11-04 NOTE — Progress Notes (Signed)
RT NOTES:  MD placed on pressure support with PEEP of +5, sats dropped to 82%. Placed back on full support with PEEP of +10. Will continue to monitor.

## 2019-11-04 NOTE — Progress Notes (Signed)
NAME:  Jacqueline Lucas, MRN:  [id number], DOB:  1950/03/15, LOS: 27 ADMISSION DATE:  10/05/2019, CONSULTATION DATE:  8/20 REFERRING MD:  EDP, CHIEF COMPLAINT:  Dyspnea   Brief History   69 y/o female admitted on 8/19 with severe acute respiratory failure with hypoxemia due to COVID 19 pneumonia.  Past Medical History  Asthma Obesity Hypertension Hyperlipidemia Degenerative disk disease Depression Fibromyalgia Heart murmur GERD Hypothyroidism IDA Insomnia Fe def anemia  Significant Hospital Events   8/20 Admit to [facility] 8/21 failed SBT, oxygenation worsened thorought the day 9/3 awake, following commands, weaning PSV 12/10 9/4 changing abx to zosyn due to worsening resp secretions despite ampicillin   Consults:    Procedures:  8/20 ETT >> 8/22 R PICC  >>  Significant Diagnostic Tests:  8/26 LE doppler > negative for DVT 8/27 TTE> LVEF 65-70%, Grade I DD, Mitral valve normal, RV normal size/function 9/4 CT head > no evidence of acute intracranial abnormality   Micro Data:  8/20 SARS COV 2>>positive 8/20 UC>> neg 8/20 BCx2>> neg 8/24 BCx2 >> neg 8/24 respiratory culture >> Klebsiella 8/30 BCx 2 >> 9/1 trach asp >>klebsiella 9/4 trach asp >> Staph aureus  Antimicrobials:  8/20 Remdesivir >8/24 8/20 baricitinib >> 9/2 8/20 solumedrol > taper started 8/27 >> 8/24 cefepime >>8/31 9/4 cefepime >>   Interim history/subjective:   Sedation wean today.  Patient awake and following commands briskly with reasonable strength.  Objective   Blood pressure (!) 113/59, pulse 63, temperature 98.2 F (36.8 C), temperature source Axillary, resp. rate (!) 28, height 5\' 4"  (1.626 m), weight 90.4 kg, SpO2 94 %.    Vent Mode: PRVC FiO2 (%):  [50 %] 50 % Set Rate:  [28 bmp] 28 bmp Vt Set:  [440 mL] 440 mL PEEP:  [10 cmH20] 10 cmH20 Plateau Pressure:  [26 cmH20-28 cmH20] 26 cmH20   Intake/Output Summary (Last 24 hours) at 11/04/2019 1054 Last data filed at 11/04/2019  0930 Gross per 24 hour  Intake 2143.92 ml  Output 1380 ml  Net 763.92 ml   Filed Weights   11/02/19 0500 11/03/19 0500 11/04/19 0500  Weight: 93.3 kg 95.6 kg 90.4 kg    Examination:  General: ill-appearing, intubated, NAD HENT: ETT in place   PULM: Mechanically ventilated, symmetrical chest expansion, coarse breath sounds throughout tolerating PSV 8/5 CV: Heart sounds unremarkable.  Extremities warm. GI: soft, non-tender, non-distended  MSK:  Diffusely edematous  Neuro: tracks to voice, follows some commands   Resolved Hospital Problem list    Assessment & Plan:   Critically ill due to acute hypoxic respiratory failure requiring mechanical ventilation caused by ARDS due to COVID-19 with secondary bacterial pneumonia -Progressive daily SBT. -Waking up better than expected with better than expected strength.  May be able to avoid tracheostomy. -We will diurese today   COVID 19 PNEUMONIA HCAP> Klebsiella, Staph aureus  -Complete course of baricitinib -Cefepime for HCAP to complete 7-day course. -Off isolation date: 9/10.  Need for sedation for mechanical ventilation Acute metabolic encephalopathy, insomnia -Fentanyl discontinued, continues to require low-dose Precedex. -Increase Oxycodone to 10 mg q8hrs per tube; add klonopin 0.5 mg BID -qHS seroquel   DM2 with hyperglycemia -CBGs well controlled on current regimen   Hypothyroidism -Synthroid  Depression -depakote -effexor  Best practice:  Diet: tube feeding Pain/Anxiety/Delirium protocol (if indicated): as above VAP protocol (if indicated): yes DVT prophylaxis: lovenox GI prophylaxis: pantoprazole Glucose control: SSI/Levemir Mobility: bed rest Code Status: full Family Communication: Son updated yesterday.  No  answer today Disposition: ICU   Labs   CBC: Recent Labs  Lab 10/31/19 0343 11/01/19 0402 11/02/19 1638 11/03/19 0401 11/04/19 0409  WBC 13.5* 19.9* 13.3* 9.3 7.2  NEUTROABS 10.9* 18.1*  --    --  5.6  HGB 10.8* 10.4* 9.1* 8.2* 8.5*  HCT 34.4* 32.8* 29.8* 26.9* 27.6*  MCV 96.9 98.5 100.3* 101.1* 101.5*  PLT 416* 389 348 295 706    Basic Metabolic Panel: Recent Labs  Lab 10/31/19 0343 11/01/19 0402 11/02/19 1852 11/03/19 0401 11/04/19 0409  NA 139 138 142 141 142  K 4.0 3.9 4.2 4.2 4.1  CL 103 103 109 109 108  CO2 26 27 26 25 26   GLUCOSE 144* 178* 185* 159* 164*  BUN 30* 26* 28* 26* 22  CREATININE 0.48 0.48 0.47 0.46 0.45  CALCIUM 9.0 8.8* 8.1* 8.2* 8.4*   GFR: Estimated Creatinine Clearance: 71.3 mL/min (by C-G formula based on SCr of 0.45 mg/dL). Recent Labs  Lab 11/01/19 0402 11/02/19 1638 11/03/19 0401 11/04/19 0409  WBC 19.9* 13.3* 9.3 7.2    Liver Function Tests: Recent Labs  Lab 10/31/19 0343 11/01/19 0402  AST 39 27  ALT 35 32  ALKPHOS 60 67  BILITOT 0.5 0.7  PROT 5.8* 5.7*  ALBUMIN 1.9* 1.9*   No results for input(s): LIPASE, AMYLASE in the last 168 hours. No results for input(s): AMMONIA in the last 168 hours.  ABG    Component Value Date/Time   PHART 7.497 (H) 10/29/2019 1153   PCO2ART 37.3 10/29/2019 1153   PO2ART 61 (L) 10/29/2019 1153   HCO3 29.0 (H) 10/29/2019 1153   TCO2 30 10/29/2019 1153   ACIDBASEDEF 1.0 10/25/2019 1259   O2SAT 93.0 10/29/2019 1153     Coagulation Profile: No results for input(s): INR, PROTIME in the last 168 hours.  Cardiac Enzymes: No results for input(s): CKTOTAL, CKMB, CKMBINDEX, TROPONINI in the last 168 hours.  HbA1C: Hgb A1c MFr Bld  Date/Time Value Ref Range Status  10/20/2019 04:04 AM 6.6 (H) 4.8 - 5.6 % Final    Comment:    (NOTE) Pre diabetes:          5.7%-6.4%  Diabetes:              >6.4%  Glycemic control for   <7.0% adults with diabetes   10/18/2019 03:53 PM 6.5 (H) 4.8 - 5.6 % Final    Comment:    (NOTE) Pre diabetes:          5.7%-6.4%  Diabetes:              >6.4%  Glycemic control for   <7.0% adults with diabetes     CBG: Recent Labs  Lab 11/03/19 1926  11/03/19 2335 11/04/19 0404 11/04/19 0539 11/04/19 0753  GLUCAP 133* 148* 148* 163* 153*    CRITICAL CARE Performed by: Kipp Brood   Total critical care time: 40 minutes  Critical care time was exclusive of separately billable procedures and treating other patients.  Critical care was necessary to treat or prevent imminent or life-threatening deterioration.  Critical care was time spent personally by me on the following activities: development of treatment plan with patient and/or surrogate as well as nursing, discussions with consultants, evaluation of patient's response to treatment, examination of patient, obtaining history from patient or surrogate, ordering and performing treatments and interventions, ordering and review of laboratory studies, ordering and review of radiographic studies, pulse oximetry, re-evaluation of patient's condition and participation in multidisciplinary rounds.  Kipp Brood, MD [facility] ICU Physician [place]  Pager: [phone] Mobile: [phone] After hours: [phone].

## 2019-11-04 NOTE — Progress Notes (Addendum)
30 mL Fentanyl drip wasted in [employer]. Witnessed by Balinda Quails RN.  Franchot Erichsen, RN

## 2019-11-04 NOTE — Progress Notes (Signed)
[place] Progress Note Patient Name: Jacqueline Lucas DOB: May 25, 1950 MRN: [id number]   Date of Service  11/04/2019  HPI/Events of Note  AM labs request.  Labs reviewed. D dimer last one 8.29=8 th.   eICU Interventions  BMP/CBC and d dimer ordered      Intervention Category Minor Interventions: Other:  Elmer Sow 11/04/2019, 4:09 AM

## 2019-11-05 LAB — GLUCOSE, CAPILLARY
Glucose-Capillary: 117 mg/dL — ABNORMAL HIGH (ref 70–99)
Glucose-Capillary: 123 mg/dL — ABNORMAL HIGH (ref 70–99)
Glucose-Capillary: 126 mg/dL — ABNORMAL HIGH (ref 70–99)
Glucose-Capillary: 130 mg/dL — ABNORMAL HIGH (ref 70–99)
Glucose-Capillary: 152 mg/dL — ABNORMAL HIGH (ref 70–99)
Glucose-Capillary: 159 mg/dL — ABNORMAL HIGH (ref 70–99)
Glucose-Capillary: 165 mg/dL — ABNORMAL HIGH (ref 70–99)

## 2019-11-05 LAB — BASIC METABOLIC PANEL
Anion gap: 11 (ref 5–15)
BUN: 21 mg/dL (ref 8–23)
CO2: 25 mmol/L (ref 22–32)
Calcium: 8.2 mg/dL — ABNORMAL LOW (ref 8.9–10.3)
Chloride: 105 mmol/L (ref 98–111)
Creatinine, Ser: 0.49 mg/dL (ref 0.44–1.00)
GFR calc Af Amer: >60 mL/min (ref >60)
GFR calc non Af Amer: >60 mL/min (ref >60)
Glucose, Bld: 169 mg/dL — ABNORMAL HIGH (ref 70–99)
Potassium: 3.6 mmol/L (ref 3.5–5.1)
Sodium: 141 mmol/L (ref 135–145)

## 2019-11-05 LAB — CBC WITH DIFFERENTIAL/PLATELET
Abs Immature Granulocytes: 0.54 10*3/uL — ABNORMAL HIGH (ref 0.00–0.07)
Basophils Absolute: 0 10*3/uL (ref 0.0–0.1)
Basophils Relative: 0 %
Eosinophils Absolute: 0.2 10*3/uL (ref 0.0–0.5)
Eosinophils Relative: 3 %
HCT: 28.6 % — ABNORMAL LOW (ref 36.0–46.0)
Hemoglobin: 8.7 g/dL — ABNORMAL LOW (ref 12.0–15.0)
Immature Granulocytes: 6 %
Lymphocytes Relative: 9 %
Lymphs Abs: 0.8 10*3/uL (ref 0.7–4.0)
MCH: 30.4 pg (ref 26.0–34.0)
MCHC: 30.4 g/dL (ref 30.0–36.0)
MCV: 100 fL (ref 80.0–100.0)
Monocytes Absolute: 0.4 10*3/uL (ref 0.1–1.0)
Monocytes Relative: 4 %
Neutro Abs: 6.7 10*3/uL (ref 1.7–7.7)
Neutrophils Relative %: 78 %
Platelets: 291 10*3/uL (ref 150–400)
RBC: 2.86 MIL/uL — ABNORMAL LOW (ref 3.87–5.11)
RDW: 14.2 % (ref 11.5–15.5)
WBC: 8.7 10*3/uL (ref 4.0–10.5)
nRBC: 0.8 % — ABNORMAL HIGH (ref 0.0–0.2)

## 2019-11-05 MED ORDER — FUROSEMIDE 10 MG/ML IJ SOLN
40.0000 mg | Freq: Two times a day (BID) | INTRAMUSCULAR | Status: AC
  Administered 2019-11-05 (×2): 40 mg via INTRAVENOUS
  Filled 2019-11-05 (×2): qty 4

## 2019-11-05 MED ORDER — CLONAZEPAM 0.25 MG PO TBDP
0.2500 mg | ORAL_TABLET | Freq: Two times a day (BID) | ORAL | Status: DC
  Administered 2019-11-05 – 2019-11-12 (×14): 0.25 mg
  Filled 2019-11-05: qty 1
  Filled 2019-11-05: qty 2
  Filled 2019-11-05: qty 1
  Filled 2019-11-05: qty 2
  Filled 2019-11-05 (×3): qty 1
  Filled 2019-11-05: qty 2
  Filled 2019-11-05 (×3): qty 1
  Filled 2019-11-05: qty 2
  Filled 2019-11-05: qty 1
  Filled 2019-11-05: qty 2

## 2019-11-05 NOTE — Progress Notes (Signed)
NAME:  Jacqueline Lucas, MRN:  [id number], DOB:  Apr 21, 1950, LOS: 20 ADMISSION DATE:  10/13/2019, CONSULTATION DATE:  8/20 REFERRING MD:  EDP, CHIEF COMPLAINT:  Dyspnea   Brief History   69 y/o female admitted on 8/19 with severe acute respiratory failure with hypoxemia due to COVID 19 pneumonia.  Past Medical History  Asthma Obesity Hypertension Hyperlipidemia Degenerative disk disease Depression Fibromyalgia Heart murmur GERD Hypothyroidism IDA Insomnia Fe def anemia  Significant Hospital Events   8/20 Admit to [facility] 8/21 failed SBT, oxygenation worsened thorought the day 9/3 awake, following commands, weaning PSV 12/10 9/4 changing abx to zosyn due to worsening resp secretions despite ampicillin   Consults:    Procedures:  8/20 ETT >> 8/22 R PICC  >>  Significant Diagnostic Tests:  8/26 LE doppler > negative for DVT 8/27 TTE> LVEF 65-70%, Grade I DD, Mitral valve normal, RV normal size/function 9/4 CT head > no evidence of acute intracranial abnormality   Micro Data:  8/20 SARS COV 2>>positive 8/20 UC>> neg 8/20 BCx2>> neg 8/24 BCx2 >> neg 8/24 respiratory culture >> Klebsiella 8/30 BCx 2 >> 9/1 trach asp >>klebsiella 9/4 trach asp >> Staph aureus  Antimicrobials:  8/20 Remdesivir >8/24 8/20 baricitinib >> 9/2 8/20 solumedrol > taper started 8/27 >> 8/24 cefepime >>8/31 9/4 cefepime >> 9/7 9/7 Ceftriaxone >  Interim history/subjective:   Requires intermittent fentanyl for coughing episodes, had just received a dose prior to my evaluation. She is able to open her eyes to voice and follow commands.   Objective   Blood pressure (!) 124/57, pulse 83, temperature 99.2 F (37.3 C), temperature source Axillary, resp. rate (!) 28, height 5\' 4"  (1.626 m), weight 94.3 kg, SpO2 92 %.    Vent Mode: PRVC FiO2 (%):  [50 %-60 %] 60 % Set Rate:  [28 bmp] 28 bmp Vt Set:  [440 mL] 440 mL PEEP:  [10 cmH20] 10 cmH20 Plateau Pressure:  [20 cmH20-29 cmH20] 20  cmH20   Intake/Output Summary (Last 24 hours) at 11/05/2019 1607 Last data filed at 11/05/2019 0600 Gross per 24 hour  Intake 2001.73 ml  Output 3833 ml  Net -1831.27 ml   Filed Weights   11/03/19 0500 11/04/19 0500 11/05/19 0500  Weight: 95.6 kg 90.4 kg 94.3 kg    Examination:  General: ill-appearing, intubated, NAD HENT: ETT in place   PULM: Mechanically ventilated, symmetrical chest expansion, coarse breath sounds throughout  CV: RRR GI: soft, non-tender, non-distended  MSK:  Diffusely edematous  Neuro: tracks to voice, follows some commands   Resolved Hospital Problem list    Assessment & Plan:   Critically ill due to acute hypoxic respiratory failure requiring mechanical ventilation caused by ARDS due to COVID-19 with secondary bacterial pneumonia -tolerating some weaning on ventilator and able to follow commands; will hopefully progress to passing SBT in the next day or so with continued optimization to avoid tracheostomy  -diurese again today    COVID 19 PNEUMONIA HCAP> Klebsiella, Staph aureus  -Complete course of baricitinib -Antibiotics narrowed to ceftriaxone to complete 7 day course  -Off isolation date: 9/10.   Need for sedation for mechanical ventilation Acute metabolic encephalopathy, insomnia -continues weaning Precedex, fentanyl pushes prn -Oxycodone to 10 mg q8hrs per tube; taper Klonipin -qHS seroquel   DM2 with hyperglycemia -CBGs well controlled on current regimen   Hypothyroidism -Synthroid  Depression -depakote -effexor  Best practice:  Diet: tube feeding Pain/Anxiety/Delirium protocol (if indicated): as above VAP protocol (if indicated): yes DVT prophylaxis:  lovenox GI prophylaxis: pantoprazole Glucose control: SSI/Levemir Mobility: bed rest Code Status: full Family Communication: will update son  Disposition: ICU   Labs   CBC: Recent Labs  Lab 10/31/19 0343 11/01/19 0402 11/02/19 1638 11/03/19 0401 11/04/19 0409  WBC  13.5* 19.9* 13.3* 9.3 7.2  NEUTROABS 10.9* 18.1*  --   --  5.6  HGB 10.8* 10.4* 9.1* 8.2* 8.5*  HCT 34.4* 32.8* 29.8* 26.9* 27.6*  MCV 96.9 98.5 100.3* 101.1* 101.5*  PLT 416* 389 348 295 248    Basic Metabolic Panel: Recent Labs  Lab 10/31/19 0343 11/01/19 0402 11/02/19 1852 11/03/19 0401 11/04/19 0409  NA 139 138 142 141 142  K 4.0 3.9 4.2 4.2 4.1  CL 103 103 109 109 108  CO2 26 27 26 25 26   GLUCOSE 144* 178* 185* 159* 164*  BUN 30* 26* 28* 26* 22  CREATININE 0.48 0.48 0.47 0.46 0.45  CALCIUM 9.0 8.8* 8.1* 8.2* 8.4*   GFR: Estimated Creatinine Clearance: 72.8 mL/min (by C-G formula based on SCr of 0.45 mg/dL). Recent Labs  Lab 11/01/19 0402 11/02/19 1638 11/03/19 0401 11/04/19 0409  WBC 19.9* 13.3* 9.3 7.2    Liver Function Tests: Recent Labs  Lab 10/31/19 0343 11/01/19 0402  AST 39 27  ALT 35 32  ALKPHOS 60 67  BILITOT 0.5 0.7  PROT 5.8* 5.7*  ALBUMIN 1.9* 1.9*   No results for input(s): LIPASE, AMYLASE in the last 168 hours. No results for input(s): AMMONIA in the last 168 hours.  ABG    Component Value Date/Time   PHART 7.497 (H) 10/29/2019 1153   PCO2ART 37.3 10/29/2019 1153   PO2ART 61 (L) 10/29/2019 1153   HCO3 29.0 (H) 10/29/2019 1153   TCO2 30 10/29/2019 1153   ACIDBASEDEF 1.0 10/25/2019 1259   O2SAT 93.0 10/29/2019 1153     Coagulation Profile: No results for input(s): INR, PROTIME in the last 168 hours.  Cardiac Enzymes: No results for input(s): CKTOTAL, CKMB, CKMBINDEX, TROPONINI in the last 168 hours.  HbA1C: Hgb A1c MFr Bld  Date/Time Value Ref Range Status  10/20/2019 04:04 AM 6.6 (H) 4.8 - 5.6 % Final    Comment:    (NOTE) Pre diabetes:          5.7%-6.4%  Diabetes:              >6.4%  Glycemic control for   <7.0% adults with diabetes   10/18/2019 03:53 PM 6.5 (H) 4.8 - 5.6 % Final    Comment:    (NOTE) Pre diabetes:          5.7%-6.4%  Diabetes:              >6.4%  Glycemic control for   <7.0% adults with  diabetes     CBG: Recent Labs  Lab 11/04/19 1125 11/04/19 1524 11/04/19 1936 11/05/19 0008 11/05/19 0340  GLUCAP 126* 146* 156* 123* 126*   Kwaku Mostafa DO, DO IMTS, PGY-3 11/05/19, 12:06 PM

## 2019-11-06 ENCOUNTER — Inpatient Hospital Stay (HOSPITAL_COMMUNITY): Payer: Medicare Other

## 2019-11-06 LAB — BASIC METABOLIC PANEL
Anion gap: 10 (ref 5–15)
BUN: 22 mg/dL (ref 8–23)
CO2: 29 mmol/L (ref 22–32)
Calcium: 8.3 mg/dL — ABNORMAL LOW (ref 8.9–10.3)
Chloride: 101 mmol/L (ref 98–111)
Creatinine, Ser: 0.49 mg/dL (ref 0.44–1.00)
GFR calc Af Amer: >60 mL/min (ref >60)
GFR calc non Af Amer: >60 mL/min (ref >60)
Glucose, Bld: 143 mg/dL — ABNORMAL HIGH (ref 70–99)
Potassium: 3.7 mmol/L (ref 3.5–5.1)
Sodium: 140 mmol/L (ref 135–145)

## 2019-11-06 LAB — GLUCOSE, CAPILLARY
Glucose-Capillary: 128 mg/dL — ABNORMAL HIGH (ref 70–99)
Glucose-Capillary: 129 mg/dL — ABNORMAL HIGH (ref 70–99)
Glucose-Capillary: 149 mg/dL — ABNORMAL HIGH (ref 70–99)
Glucose-Capillary: 153 mg/dL — ABNORMAL HIGH (ref 70–99)
Glucose-Capillary: 152 mg/dL — ABNORMAL HIGH (ref 70–99)
Glucose-Capillary: 152 mg/dL — ABNORMAL HIGH (ref 70–99)

## 2019-11-06 MED ORDER — FUROSEMIDE 10 MG/ML IJ SOLN
40.0000 mg | Freq: Two times a day (BID) | INTRAMUSCULAR | Status: AC
  Administered 2019-11-06 (×2): 40 mg via INTRAVENOUS
  Filled 2019-11-06 (×2): qty 4

## 2019-11-06 NOTE — Progress Notes (Signed)
Nutrition Follow-up  DOCUMENTATION CODES:   Obesity unspecified  INTERVENTION:   Continue Tube Feeding via Cortrak (post-pyloric) Vital 1.5 at 55 ml/hr Pro-Source TF 45 mL QID Provides 2140 kcals, 133 g of protein and 1003 mL of free water Meets 100% estimated calorie and protein needs:    NUTRITION DIAGNOSIS:   Inadequate oral intake related to inability to eat as evidenced by NPO status.  Being addressed via TF   GOAL:   Patient will meet greater than or equal to 90% of their needs  Progressing   MONITOR:   Vent status, Labs, Weight trends, TF tolerance, I & O's  REASON FOR ASSESSMENT:   Consult Assessment of nutrition requirement/status, Enteral/tube feeding initiation and management  ASSESSMENT:   69 year old female with PMHx of HTN, asthma, HLD, anxiety, arthritis, depression, GERD, migraines, chronic nausea, fibromyalgia, IBS, hypothyroidism, osteoporosis, iron deficiency anemia who was diagnosed with COVID-19 on 8/19 and now admitted with respiratory failure.  8/20 Intubated 8/25 Cortrak placed (gastric) 9/01 Cortrak tube advanced, not post pyloric? (no xray obtained) 9/03 Cortrak (post-pyloric)  Pt remains on vent suppor currently, on precedex Noted improvement per MD notes, possible trial of extubation in next few days  Tolerating Vital 1.5 at 55 ml/hr with Pro-Source TF 45 mL QID via Cortrak  No wounds per RN skin assessment  Weight relatively stable since admission; current wt 94.3 kg; admit weight the same. Net negative 3 L, noted +edema. Unsure of actual EDW at present. Noted weight of 90.4 kg yesterday  Labs: CBGs 129-159 (ICU goal 140-180) Meds: ss novolog, levemir, miralax   Diet Order:   Diet Order    None      EDUCATION NEEDS:   No education needs have been identified at this time  Skin:  Skin Assessment: Reviewed RN Assessment  Last BM:  9/2 rectal tube  Height:   Ht Readings from Last 1 Encounters:  10/22/19 5\' 4"  (1.626  m)    Weight:   Wt Readings from Last 1 Encounters:  11/05/19 94.3 kg    Ideal Body Weight:   (IBW: 55 kg, Adjusted BW: 70 kg)  BMI:  Body mass index is 35.68 kg/m.  Estimated Nutritional Needs:   Kcal:  4540-9811 kcals  Protein:  120-140 g  Fluid:  >/= 2 L/day    Kerman Passey MS, RDN, LDN, CNSC Registered Dietitian III Clinical Nutrition RD Pager and On-Call Pager Number Located in [place]

## 2019-11-06 NOTE — Progress Notes (Signed)
NAME:  Jacqueline Lucas, MRN:  [id number], DOB:  [birth year], LOS: 95 ADMISSION DATE:  10/03/2019, CONSULTATION DATE:  8/20 REFERRING MD:  EDP, CHIEF COMPLAINT:  Dyspnea   Brief History   69 y/o female admitted on 8/19 with severe acute respiratory failure with hypoxemia due to COVID 19 pneumonia.  Past Medical History  Asthma Obesity Hypertension Hyperlipidemia Degenerative disk disease Depression Fibromyalgia Heart murmur GERD Hypothyroidism IDA Insomnia Fe def anemia  Significant Hospital Events   8/20 Admit to [facility] 8/21 failed SBT, oxygenation worsened thorought the day 9/3 awake, following commands, weaning PSV 12/10 9/4 changing abx to zosyn due to worsening resp secretions despite ampicillin   Consults:    Procedures:  8/20 ETT >> 8/22 R PICC  >>  Significant Diagnostic Tests:  8/26 LE doppler > negative for DVT 8/27 TTE> LVEF 65-70%, Grade I DD, Mitral valve normal, RV normal size/function 9/4 CT head > no evidence of acute intracranial abnormality   Micro Data:  8/20 SARS COV 2>>positive 8/20 UC>> neg 8/20 BCx2>> neg 8/24 BCx2 >> neg 8/24 respiratory culture >> Klebsiella 8/30 BCx 2 >> 9/1 trach asp >>klebsiella 9/4 trach asp >> Staph aureus  Antimicrobials:  8/20 Remdesivir >8/24 8/20 baricitinib >> 9/2 8/20 solumedrol > taper started 8/27 >> 8/24 cefepime >>8/31 9/4 cefepime >> 9/7 9/7 Ceftriaxone >  Interim history/subjective:   On low-dose precedex for sedation and intermittent fentanyl for coughing episodes. Fairly unchanged today. Will open eyes and follow basic commands.   Objective   Blood pressure (!) 125/55, pulse 83, temperature (!) 97.2 F (36.2 C), temperature source Axillary, resp. rate (!) 26, height 5\' 4"  (1.626 m), weight 94.3 kg, SpO2 91 %.    Vent Mode: PRVC FiO2 (%):  [60 %-80 %] 60 % Set Rate:  [15 bmp] 15 bmp Vt Set:  [440 mL] 440 mL PEEP:  [8 cmH20-10 cmH20] 8 cmH20 Plateau Pressure:  [23 cmH20-30 cmH20] 30  cmH20   Intake/Output Summary (Last 24 hours) at 11/06/2019 1130 Last data filed at 11/06/2019 1100 Gross per 24 hour  Intake 2104.5 ml  Output 3395 ml  Net -1290.5 ml   Filed Weights   11/03/19 0500 11/04/19 0500 11/05/19 0500  Weight: 95.6 kg 90.4 kg 94.3 kg    Examination:  General: ill-appearing, intubated, NAD HENT: ETT in place   PULM: Mechanically ventilated, symmetrical chest expansion, coarse breath sounds throughout  CV: RRR GI: soft, non-tender, non-distended  MSK:  Diffusely edematous  Neuro: tracks to voice, follows some commands   Resolved Hospital Problem list    Assessment & Plan:   Critically ill due to acute hypoxic respiratory failure requiring mechanical ventilation caused by ARDS due to COVID-19 with secondary bacterial pneumonia -tolerating some weaning on ventilator and able to follow commands; will hopefully progress to passing SBT in the next day or so with continued optimization to avoid tracheostomy  -CXR with interval worsening compared to 9/6; O2 saturations remain stable on current vent settings  -tolerating diuresis well; will continue for today    COVID 19 PNEUMONIA HCAP> Klebsiella, Staph aureus  -Complete course of baricitinib -afebrile > 24 hours; leukocytosis resolved  -Antibiotics narrowed to ceftriaxone to complete 7 day course (stop date 9/10) -Off isolation date: 9/10.   Need for sedation for mechanical ventilation Acute metabolic encephalopathy, insomnia -on low-does Precedex, fentanyl pushes prn -Oxycodone to 10 mg q8hrs per tube -qHS seroquel   DM2 with hyperglycemia -CBGs well controlled on current regimen   Hypothyroidism -Synthroid  Depression -depakote -effexor  Best practice:  Diet: tube feeding Pain/Anxiety/Delirium protocol (if indicated): as above VAP protocol (if indicated): yes DVT prophylaxis: lovenox GI prophylaxis: pantoprazole Glucose control: SSI/Levemir Mobility: bed rest Code Status: full Family  Communication: pending  Disposition: ICU   Labs   CBC: Recent Labs  Lab 10/31/19 0343 10/31/19 0343 11/01/19 0402 11/02/19 1638 11/03/19 0401 11/04/19 0409 11/05/19 0759  WBC 13.5*   < > 19.9* 13.3* 9.3 7.2 8.7  NEUTROABS 10.9*  --  18.1*  --   --  5.6 6.7  HGB 10.8*   < > 10.4* 9.1* 8.2* 8.5* 8.7*  HCT 34.4*   < > 32.8* 29.8* 26.9* 27.6* 28.6*  MCV 96.9   < > 98.5 100.3* 101.1* 101.5* 100.0  PLT 416*   < > 389 348 295 327 291   < > = values in this interval not displayed.    Basic Metabolic Panel: Recent Labs  Lab 11/02/19 1852 11/03/19 0401 11/04/19 0409 11/05/19 0759 11/06/19 0500  NA 142 141 142 141 140  K 4.2 4.2 4.1 3.6 3.7  CL 109 109 108 105 101  CO2 26 25 26 25 29   GLUCOSE 185* 159* 164* 169* 143*  BUN 28* 26* 22 21 22   CREATININE 0.47 0.46 0.45 0.49 0.49  CALCIUM 8.1* 8.2* 8.4* 8.2* 8.3*   GFR: Estimated Creatinine Clearance: 72.8 mL/min (by C-G formula based on SCr of 0.49 mg/dL). Recent Labs  Lab 11/02/19 1638 11/03/19 0401 11/04/19 0409 11/05/19 0759  WBC 13.3* 9.3 7.2 8.7    Liver Function Tests: Recent Labs  Lab 10/31/19 0343 11/01/19 0402  AST 39 27  ALT 35 32  ALKPHOS 60 67  BILITOT 0.5 0.7  PROT 5.8* 5.7*  ALBUMIN 1.9* 1.9*   No results for input(s): LIPASE, AMYLASE in the last 168 hours. No results for input(s): AMMONIA in the last 168 hours.  ABG    Component Value Date/Time   PHART 7.497 (H) 10/29/2019 1153   PCO2ART 37.3 10/29/2019 1153   PO2ART 61 (L) 10/29/2019 1153   HCO3 29.0 (H) 10/29/2019 1153   TCO2 30 10/29/2019 1153   ACIDBASEDEF 1.0 10/25/2019 1259   O2SAT 93.0 10/29/2019 1153     Coagulation Profile: No results for input(s): INR, PROTIME in the last 168 hours.  Cardiac Enzymes: No results for input(s): CKTOTAL, CKMB, CKMBINDEX, TROPONINI in the last 168 hours.  HbA1C: Hgb A1c MFr Bld  Date/Time Value Ref Range Status  10/20/2019 04:04 AM 6.6 (H) 4.8 - 5.6 % Final    Comment:    (NOTE) Pre  diabetes:          5.7%-6.4%  Diabetes:              >6.4%  Glycemic control for   <7.0% adults with diabetes   10/18/2019 03:53 PM 6.5 (H) 4.8 - 5.6 % Final    Comment:    (NOTE) Pre diabetes:          5.7%-6.4%  Diabetes:              >6.4%  Glycemic control for   <7.0% adults with diabetes     CBG: Recent Labs  Lab 11/05/19 1618 11/05/19 1934 11/05/19 2344 11/06/19 0358 11/06/19 0735  GLUCAP 152* 130* 159* 128* 129*   Oleta Gunnoe DO, DO IMTS, PGY-3 11/06/19, 11:43 am

## 2019-11-07 ENCOUNTER — Inpatient Hospital Stay (HOSPITAL_COMMUNITY): Payer: Medicare Other

## 2019-11-07 DIAGNOSIS — J9601 Acute respiratory failure with hypoxia: Secondary | ICD-10-CM

## 2019-11-07 LAB — GLUCOSE, CAPILLARY
Glucose-Capillary: 126 mg/dL — ABNORMAL HIGH (ref 70–99)
Glucose-Capillary: 166 mg/dL — ABNORMAL HIGH (ref 70–99)
Glucose-Capillary: 107 mg/dL — ABNORMAL HIGH (ref 70–99)
Glucose-Capillary: 120 mg/dL — ABNORMAL HIGH (ref 70–99)
Glucose-Capillary: 105 mg/dL — ABNORMAL HIGH (ref 70–99)
Glucose-Capillary: 105 mg/dL — ABNORMAL HIGH (ref 70–99)

## 2019-11-07 LAB — CULTURE, BLOOD (ROUTINE X 2)
Culture: NO GROWTH
Culture: NO GROWTH
Special Requests: ADEQUATE
Special Requests: ADEQUATE

## 2019-11-07 LAB — CBC WITH DIFFERENTIAL/PLATELET
Abs Immature Granulocytes: 0.78 10*3/uL — ABNORMAL HIGH (ref 0.00–0.07)
Basophils Absolute: 0.1 10*3/uL (ref 0.0–0.1)
Basophils Relative: 1 %
Eosinophils Absolute: 0.4 10*3/uL (ref 0.0–0.5)
Eosinophils Relative: 4 %
HCT: 29.4 % — ABNORMAL LOW (ref 36.0–46.0)
Hemoglobin: 9.1 g/dL — ABNORMAL LOW (ref 12.0–15.0)
Immature Granulocytes: 9 %
Lymphocytes Relative: 10 %
Lymphs Abs: 0.9 10*3/uL (ref 0.7–4.0)
MCH: 30.3 pg (ref 26.0–34.0)
MCHC: 31 g/dL (ref 30.0–36.0)
MCV: 98 fL (ref 80.0–100.0)
Monocytes Absolute: 0.5 10*3/uL (ref 0.1–1.0)
Monocytes Relative: 5 %
Neutro Abs: 6.6 10*3/uL (ref 1.7–7.7)
Neutrophils Relative %: 71 %
Platelets: 318 10*3/uL (ref 150–400)
RBC: 3 MIL/uL — ABNORMAL LOW (ref 3.87–5.11)
RDW: 14.2 % (ref 11.5–15.5)
WBC: 9.2 10*3/uL (ref 4.0–10.5)
nRBC: 0.5 % — ABNORMAL HIGH (ref 0.0–0.2)

## 2019-11-07 LAB — BASIC METABOLIC PANEL
Anion gap: 10 (ref 5–15)
BUN: 21 mg/dL (ref 8–23)
CO2: 30 mmol/L (ref 22–32)
Calcium: 8.5 mg/dL — ABNORMAL LOW (ref 8.9–10.3)
Chloride: 100 mmol/L (ref 98–111)
Creatinine, Ser: 0.48 mg/dL (ref 0.44–1.00)
GFR calc Af Amer: >60 mL/min (ref >60)
GFR calc non Af Amer: >60 mL/min (ref >60)
Glucose, Bld: 172 mg/dL — ABNORMAL HIGH (ref 70–99)
Potassium: 4 mmol/L (ref 3.5–5.1)
Sodium: 140 mmol/L (ref 135–145)

## 2019-11-07 LAB — PROTIME-INR
INR: 1.2 (ref 0.8–1.2)
Prothrombin Time: 15.1 seconds (ref 11.4–15.2)

## 2019-11-07 MED ORDER — MIDAZOLAM HCL 2 MG/2ML IJ SOLN
5.0000 mg | Freq: Once | INTRAMUSCULAR | Status: AC
  Administered 2019-11-07: 5 mg via INTRAVENOUS
  Filled 2019-11-07: qty 6

## 2019-11-07 MED ORDER — DOCUSATE SODIUM 50 MG/5ML PO LIQD
100.0000 mg | Freq: Two times a day (BID) | ORAL | Status: DC
  Administered 2019-11-07 – 2019-11-11 (×9): 100 mg via ORAL
  Filled 2019-11-07 (×8): qty 10

## 2019-11-07 MED ORDER — MIDAZOLAM HCL 2 MG/2ML IJ SOLN
1.0000 mg | INTRAMUSCULAR | Status: DC | PRN
  Filled 2019-11-07: qty 2

## 2019-11-07 MED ORDER — VECURONIUM BROMIDE 10 MG IV SOLR
10.0000 mg | Freq: Once | INTRAVENOUS | Status: AC
  Administered 2019-11-07: 10 mg via INTRAVENOUS
  Filled 2019-11-07: qty 10

## 2019-11-07 MED ORDER — FENTANYL BOLUS VIA INFUSION
25.0000 ug | INTRAVENOUS | Status: DC | PRN
  Administered 2019-11-08 – 2019-11-11 (×4): 25 ug via INTRAVENOUS
  Filled 2019-11-07: qty 25

## 2019-11-07 MED ORDER — MIDAZOLAM HCL 2 MG/2ML IJ SOLN
2.0000 mg | Freq: Once | INTRAMUSCULAR | Status: AC
  Administered 2019-11-07: 2 mg via INTRAVENOUS

## 2019-11-07 MED ORDER — MIDAZOLAM 50MG/50ML (1MG/ML) PREMIX INFUSION
0.5000 mg/h | INTRAVENOUS | Status: DC
  Administered 2019-11-07 – 2019-11-09 (×3): 2 mg/h via INTRAVENOUS
  Administered 2019-11-09: 3.5 mg/h via INTRAVENOUS
  Administered 2019-11-10: 2 mg/h via INTRAVENOUS
  Administered 2019-11-11: 2.5 mg/h via INTRAVENOUS
  Administered 2019-11-11: 2 mg/h via INTRAVENOUS
  Filled 2019-11-07 (×8): qty 50

## 2019-11-07 MED ORDER — PROPOFOL 10 MG/ML IV BOLUS
30.0000 mg | Freq: Once | INTRAVENOUS | Status: AC
  Administered 2019-11-07: 30 mg via INTRAVENOUS
  Filled 2019-11-07: qty 20

## 2019-11-07 MED ORDER — ATROPINE SULFATE 1 MG/10ML IJ SOSY
PREFILLED_SYRINGE | INTRAMUSCULAR | Status: AC
  Filled 2019-11-07: qty 10

## 2019-11-07 MED ORDER — MIDAZOLAM HCL 2 MG/2ML IJ SOLN
1.0000 mg | INTRAMUSCULAR | Status: DC | PRN
  Administered 2019-11-07: 1 mg via INTRAVENOUS

## 2019-11-07 MED ORDER — FENTANYL CITRATE (PF) 100 MCG/2ML IJ SOLN
200.0000 ug | Freq: Once | INTRAMUSCULAR | Status: AC
  Administered 2019-11-07: 200 ug via INTRAVENOUS
  Filled 2019-11-07: qty 4

## 2019-11-07 MED ORDER — FENTANYL CITRATE (PF) 100 MCG/2ML IJ SOLN: 25.0000 ug | Freq: Once | INTRAMUSCULAR | Status: DC

## 2019-11-07 MED ORDER — FENTANYL 2500MCG IN NS 250ML (10MCG/ML) PREMIX INFUSION
25.0000 ug/h | INTRAVENOUS | Status: DC
  Administered 2019-11-07 – 2019-11-08 (×3): 200 ug/h via INTRAVENOUS
  Administered 2019-11-09 (×2): 180 ug/h via INTRAVENOUS
  Administered 2019-11-10 (×2): 200 ug/h via INTRAVENOUS
  Administered 2019-11-11: 160 ug/h via INTRAVENOUS
  Administered 2019-11-11: 200 ug/h via INTRAVENOUS
  Administered 2019-11-12: 100 ug/h via INTRAVENOUS
  Filled 2019-11-07 (×10): qty 250

## 2019-11-07 MED ORDER — POLYETHYLENE GLYCOL 3350 17 G PO PACK
17.0000 g | PACK | Freq: Every day | ORAL | Status: DC
  Administered 2019-11-08: 17 g via ORAL
  Filled 2019-11-07: qty 1

## 2019-11-07 NOTE — Progress Notes (Signed)
Sputum culture collected, sent to lab.  

## 2019-11-07 NOTE — Procedures (Signed)
Diagnostic Bronchoscopy  Jacqueline Lucas  [id number]  [birth year]  Date:11/07/19  Time:5:40 PM   Provider Performing:Moya Duan V. Elye Harmsen   Procedure: Diagnostic Bronchoscopy (05110)  Indication(s) Assist with direct visualization of tracheostomy placement  Consent Risks of the procedure as well as the alternatives and risks of each were explained to the patient and/or caregiver.  Consent for the procedure was obtained.   Anesthesia See separate tracheostomy note   Time Out Verified patient identification, verified procedure, site/side was marked, verified correct patient position, special equipment/implants available, medications/allergies/relevant history reviewed, required imaging and test results available.   Sterile Technique Usual hand hygiene, masks, gowns, and gloves were used   Procedure Description Bronchoscope advanced through endotracheal tube and into airway.  After suctioning out tracheal secretions, bronchoscope used to provide direct visualization of tracheostomy placement.  Unfortunately tracheostomy procedure could not be performed for technical reasons, see separate note The cuff appeared to be blown and returning lung volumes were low even prior to starting the procedure.  ET tube was exchanged over a tube exchanger and position of new ET tube was confirmed using bronchoscopy  Complications/Tolerance None; patient tolerated the procedure well.     EBL None  Specimen(s) None  Jacqueline Lucas V. Elsworth Soho MD

## 2019-11-07 NOTE — Progress Notes (Signed)
NAME:  Jacqueline Lucas, MRN:  [id number], DOB:  [birth year], LOS: 21 ADMISSION DATE:  09/30/2019, CONSULTATION DATE:  8/20 REFERRING MD:  EDP, CHIEF COMPLAINT:  Dyspnea   Brief History   69 y/o female admitted on 8/19 with severe acute respiratory failure with hypoxemia due to COVID 19 pneumonia.  Past Medical History  Asthma Obesity Hypertension Hyperlipidemia Degenerative disk disease Depression Fibromyalgia Heart murmur GERD Hypothyroidism IDA Insomnia Fe def anemia  Significant Hospital Events   8/20 Admit to [facility] 8/21 failed SBT, oxygenation worsened thorought the day 9/3 awake, following commands, weaning PSV 12/10 9/4 changing abx to zosyn due to worsening resp secretions despite ampicillin   Consults:    Procedures:  8/20 ETT >> 8/22 R PICC  >>  Significant Diagnostic Tests:  8/26 LE doppler > negative for DVT 8/27 TTE> LVEF 65-70%, Grade I DD, Mitral valve normal, RV normal size/function 9/4 CT head > no evidence of acute intracranial abnormality   Micro Data:  8/20 SARS COV 2>>positive 8/20 UC>> neg 8/20 BCx2>> neg 8/24 BCx2 >> neg 8/24 respiratory culture >> Klebsiella 8/30 BCx 2 >> 9/1 trach asp >>klebsiella 9/4 trach asp >> Staph aureus  Antimicrobials:  8/20 Remdesivir >8/24 8/20 baricitinib >> 9/2 8/20 solumedrol > taper started 8/27 >> 8/24 cefepime >>8/31 9/4 cefepime >> 9/7 9/7 Ceftriaxone >  Interim history/subjective:   Continues to require fentanyl pushes for cough and comfort. She is awake and indicates that most of her discomfort is coming from endotracheal tube.   Objective   Blood pressure (!) 170/60, pulse (!) 101, temperature (!) 101.3 F (38.5 C), temperature source Oral, resp. rate (!) 33, height 5\' 4"  (1.626 m), weight 94.3 kg, SpO2 (!) 86 %.    Vent Mode: PRVC FiO2 (%):  [60 %] 60 % Set Rate:  [15 bmp] 15 bmp Vt Set:  [440 mL] 440 mL PEEP:  [8 cmH20] 8 cmH20 Plateau Pressure:  [24 cmH20-30 cmH20] 24 cmH20    Intake/Output Summary (Last 24 hours) at 11/07/2019 1204 Last data filed at 11/07/2019 1000 Gross per 24 hour  Intake 1746 ml  Output 3200 ml  Net -1454 ml   Filed Weights   11/03/19 0500 11/04/19 0500 11/05/19 0500  Weight: 95.6 kg 90.4 kg 94.3 kg    Examination:  General: awake, intubated, appears uncomfortable  HENT: ETT in place   PULM: Mechanically ventilated, symmetrical chest expansion, coarse breath sounds throughout  CV: RRR GI: soft, non-tender, non-distended  MSK:  Edematous throughout Neuro: tracks to voice, follows commands, can answer questions with head nods    Resolved Hospital Problem list    Assessment & Plan:   Critically ill due to acute hypoxic respiratory failure requiring mechanical ventilation caused by ARDS due to COVID-19 with secondary bacterial pneumonia -patient has been unable to wean any further on vent due to respiratory status but is awake and alert enough to indicate that the ETT is causing most of her discomfort -will transition to tracheostomy this afternoon which will allow for easier secretion management and less sedation   COVID 19 PNEUMONIA HCAP> Klebsiella, Staph aureus  -she has completed COVID therapy -Antibiotics narrowed to ceftriaxone to complete 7 day course  -will re-culture secretions given her fever and increased production   -Off isolation date: 9/10.   Need for sedation for mechanical ventilation Acute metabolic encephalopathy, insomnia -on low-does Precedex, fentanyl pushes prn -Oxycodone to 10 mg q8hrs per tube -qHS seroquel  -sedation requirements will likely decrease s/p trach  DM2 with hyperglycemia -CBGs well controlled on current regimen   Hypothyroidism -Synthroid  Depression -depakote -effexor  Best practice:  Diet: tube feeding Pain/Anxiety/Delirium protocol (if indicated): as above VAP protocol (if indicated): yes DVT prophylaxis: lovenox GI prophylaxis: pantoprazole Glucose control:  SSI/Levemir Mobility: bed rest Code Status: full Family Communication: son updated by attending physician, consent obtained for tracheostomy  Disposition: ICU   Labs   CBC: Recent Labs  Lab 11/01/19 0402 11/01/19 0402 11/02/19 1638 11/03/19 0401 11/04/19 0409 11/05/19 0759 11/07/19 0900  WBC 19.9*   < > 13.3* 9.3 7.2 8.7 9.2  NEUTROABS 18.1*  --   --   --  5.6 6.7 6.6  HGB 10.4*   < > 9.1* 8.2* 8.5* 8.7* 9.1*  HCT 32.8*   < > 29.8* 26.9* 27.6* 28.6* 29.4*  MCV 98.5   < > 100.3* 101.1* 101.5* 100.0 98.0  PLT 389   < > 348 295 327 291 318   < > = values in this interval not displayed.    Basic Metabolic Panel: Recent Labs  Lab 11/03/19 0401 11/04/19 0409 11/05/19 0759 11/06/19 0500 11/07/19 0441  NA 141 142 141 140 140  K 4.2 4.1 3.6 3.7 4.0  CL 109 108 105 101 100  CO2 25 26 25 29 30   GLUCOSE 159* 164* 169* 143* 172*  BUN 26* 22 21 22 21   CREATININE 0.46 0.45 0.49 0.49 0.48  CALCIUM 8.2* 8.4* 8.2* 8.3* 8.5*   GFR: Estimated Creatinine Clearance: 72.8 mL/min (by C-G formula based on SCr of 0.48 mg/dL). Recent Labs  Lab 11/03/19 0401 11/04/19 0409 11/05/19 0759 11/07/19 0900  WBC 9.3 7.2 8.7 9.2    Liver Function Tests: Recent Labs  Lab 11/01/19 0402  AST 27  ALT 32  ALKPHOS 67  BILITOT 0.7  PROT 5.7*  ALBUMIN 1.9*   No results for input(s): LIPASE, AMYLASE in the last 168 hours. No results for input(s): AMMONIA in the last 168 hours.  ABG    Component Value Date/Time   PHART 7.497 (H) 10/29/2019 1153   PCO2ART 37.3 10/29/2019 1153   PO2ART 61 (L) 10/29/2019 1153   HCO3 29.0 (H) 10/29/2019 1153   TCO2 30 10/29/2019 1153   ACIDBASEDEF 1.0 10/25/2019 1259   O2SAT 93.0 10/29/2019 1153     Coagulation Profile: No results for input(s): INR, PROTIME in the last 168 hours.  Cardiac Enzymes: No results for input(s): CKTOTAL, CKMB, CKMBINDEX, TROPONINI in the last 168 hours.  HbA1C: Hgb A1c MFr Bld  Date/Time Value Ref Range Status   10/20/2019 04:04 AM 6.6 (H) 4.8 - 5.6 % Final    Comment:    (NOTE) Pre diabetes:          5.7%-6.4%  Diabetes:              >6.4%  Glycemic control for   <7.0% adults with diabetes   10/18/2019 03:53 PM 6.5 (H) 4.8 - 5.6 % Final    Comment:    (NOTE) Pre diabetes:          5.7%-6.4%  Diabetes:              >6.4%  Glycemic control for   <7.0% adults with diabetes     CBG: Recent Labs  Lab 11/06/19 1521 11/06/19 1930 11/06/19 2324 11/07/19 0340 11/07/19 0758  GLUCAP 153* 152* 152* 126* 166*   Guillaume Weninger DO, DO IMTS, PGY-3 11/07/19, 12:15 PM

## 2019-11-07 NOTE — Procedures (Signed)
Attempted percutaneous tracheostomy.  Unable to cannulate airway due to tracheal calcifications. Local bleeding encountered. Pressure held until hemostasis achieved. Skin closed with two stitches.  CCS is aware if bleeding occurs, but recommend ENT to attempt open tracheostomy.   Kipp Brood, MD [facility] ICU Physician [place]  Pager: [phone] Mobile: [phone] After hours: [phone].  11/07/2019, 6:15 PM

## 2019-11-07 NOTE — Procedures (Signed)
Intubation Procedure Note  Jacqueline Lucas  [id number]  11-Jul-1950  Date:11/07/19  Time:5:43 PM   Provider Performing:Maejor Erven V. Shanae Luo    Procedure: Intubation (62229)  Indication(s) Respiratory Failure  Consent Unable to obtain consent due to emergent nature of procedure.   Anesthesia Etomidate, Versed and Fentanyl   Time Out Verified patient identification, verified procedure, site/side was marked, verified correct patient position, special equipment/implants available, medications/allergies/relevant history reviewed, required imaging and test results available.   Sterile Technique Usual hand hygeine, masks, and gloves were used   Procedure Description Patient positioned in bed supine.  Sedation given as noted above.  Returning lung volumes were low and decision was made to exchange ET tube.  Tube exchanger wires and 7.5 ET tube exchanged. number of attempts was 1.  Colorimetric CO2 detector was consistent with tracheal placement.   Complications/Tolerance None; patient tolerated the procedure well. Chest X-ray is ordered to verify placement.   EBL Minimal   Specimen(s) None  Jacqueline Sandles V. Elsworth Soho MD

## 2019-11-08 ENCOUNTER — Inpatient Hospital Stay (HOSPITAL_COMMUNITY): Payer: Medicare Other

## 2019-11-08 LAB — BASIC METABOLIC PANEL
Anion gap: 9 (ref 5–15)
BUN: 17 mg/dL (ref 8–23)
CO2: 31 mmol/L (ref 22–32)
Calcium: 8.7 mg/dL — ABNORMAL LOW (ref 8.9–10.3)
Chloride: 102 mmol/L (ref 98–111)
Creatinine, Ser: 0.42 mg/dL — ABNORMAL LOW (ref 0.44–1.00)
GFR calc Af Amer: >60 mL/min (ref >60)
GFR calc non Af Amer: >60 mL/min (ref >60)
Glucose, Bld: 98 mg/dL (ref 70–99)
Potassium: 4.2 mmol/L (ref 3.5–5.1)
Sodium: 142 mmol/L (ref 135–145)

## 2019-11-08 LAB — CBC WITH DIFFERENTIAL/PLATELET
Abs Immature Granulocytes: 0.55 10*3/uL — ABNORMAL HIGH (ref 0.00–0.07)
Basophils Absolute: 0 10*3/uL (ref 0.0–0.1)
Basophils Relative: 1 %
Eosinophils Absolute: 0.5 10*3/uL (ref 0.0–0.5)
Eosinophils Relative: 6 %
HCT: 27.9 % — ABNORMAL LOW (ref 36.0–46.0)
Hemoglobin: 8.4 g/dL — ABNORMAL LOW (ref 12.0–15.0)
Immature Granulocytes: 7 %
Lymphocytes Relative: 13 %
Lymphs Abs: 1.1 10*3/uL (ref 0.7–4.0)
MCH: 30.1 pg (ref 26.0–34.0)
MCHC: 30.1 g/dL (ref 30.0–36.0)
MCV: 100 fL (ref 80.0–100.0)
Monocytes Absolute: 0.5 10*3/uL (ref 0.1–1.0)
Monocytes Relative: 6 %
Neutro Abs: 5.8 10*3/uL (ref 1.7–7.7)
Neutrophils Relative %: 67 %
Platelets: 283 10*3/uL (ref 150–400)
RBC: 2.79 MIL/uL — ABNORMAL LOW (ref 3.87–5.11)
RDW: 14.4 % (ref 11.5–15.5)
WBC: 8.5 10*3/uL (ref 4.0–10.5)
nRBC: 0.2 % (ref 0.0–0.2)

## 2019-11-08 LAB — GLUCOSE, CAPILLARY
Glucose-Capillary: 116 mg/dL — ABNORMAL HIGH (ref 70–99)
Glucose-Capillary: 121 mg/dL — ABNORMAL HIGH (ref 70–99)
Glucose-Capillary: 138 mg/dL — ABNORMAL HIGH (ref 70–99)
Glucose-Capillary: 203 mg/dL — ABNORMAL HIGH (ref 70–99)
Glucose-Capillary: 78 mg/dL (ref 70–99)
Glucose-Capillary: 93 mg/dL (ref 70–99)

## 2019-11-08 IMAGING — CT CT HEAD W/O CM
3 series · 15 of 45 positions shown, 18 images · non-contrast
Comparison: Brain MRI 06/23/2016

CLINICAL DATA: Dizziness. Headache.

EXAM:
CT HEAD WITHOUT CONTRAST
TECHNIQUE: Contiguous axial images were obtained from the base of the skull
through the vertex without intravenous contrast.

[Series 2: head wo · axial · 0.40mm/px · z∈[+228,+343]mm · 9 of 28 slices shown, 12 images]
[im 3/28  brain]
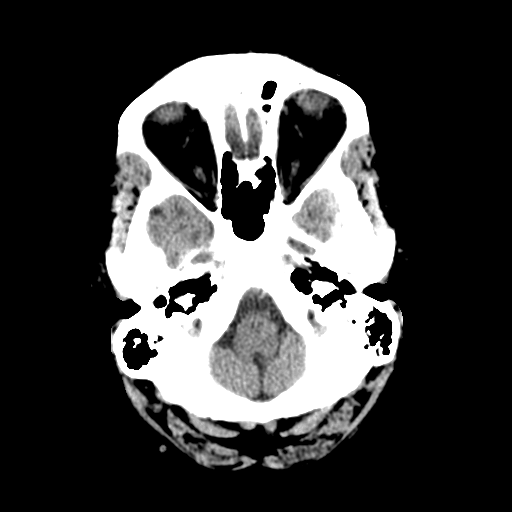
[im 3/28  bone]
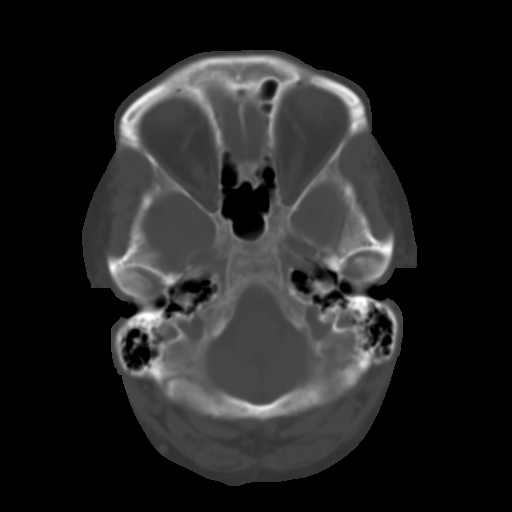
[im 6/28  brain]
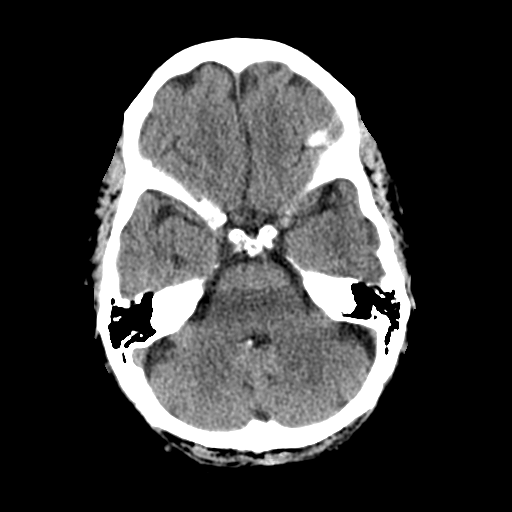
[im 9/28  brain]
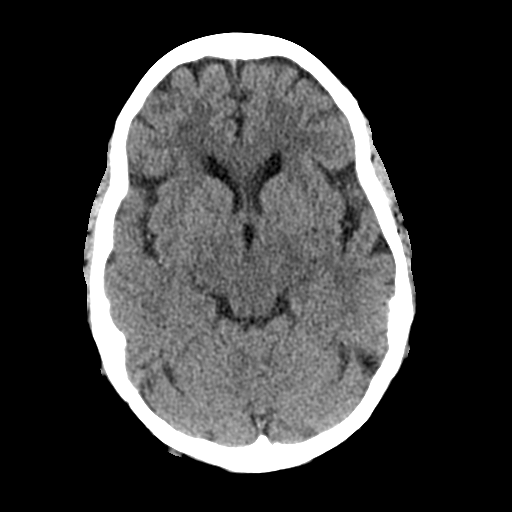
[im 12/28  brain]
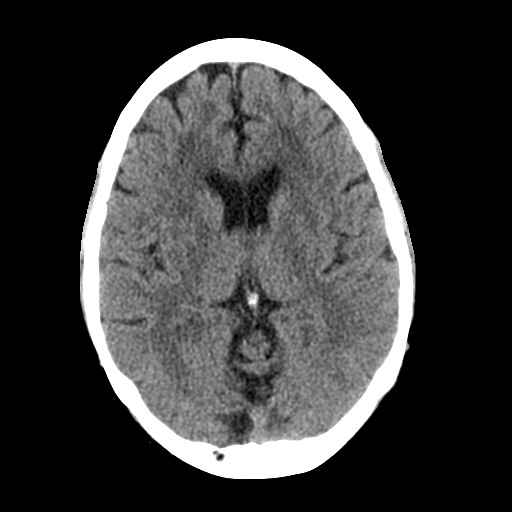
[im 15/28  brain]
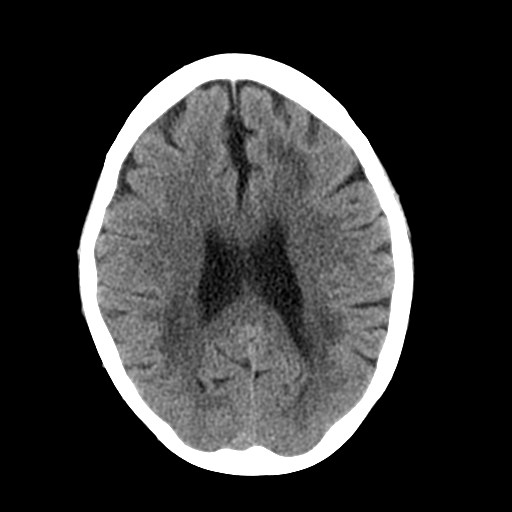
[im 15/28  bone]
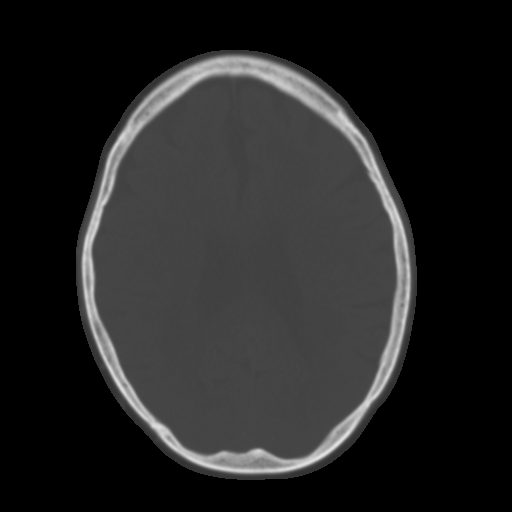
[im 17/28  brain]
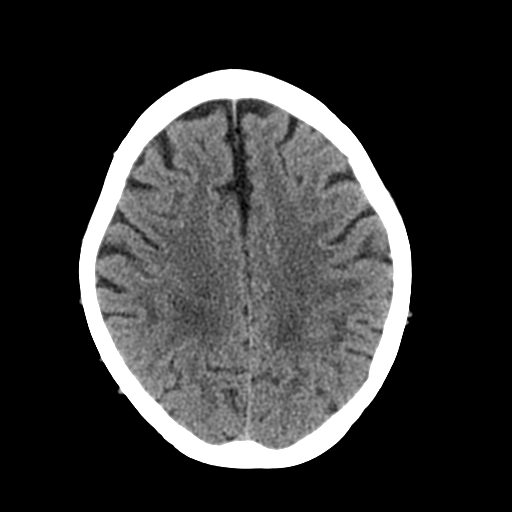
[im 20/28  brain]
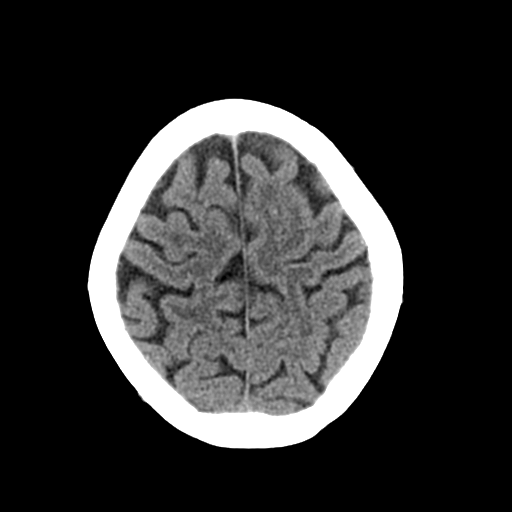
[im 23/28  brain]
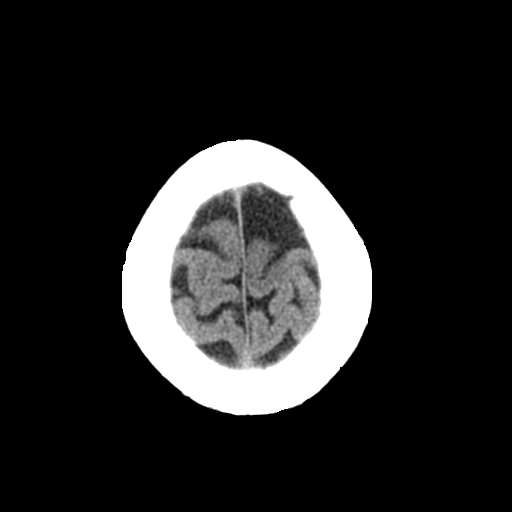
[im 26/28  brain]
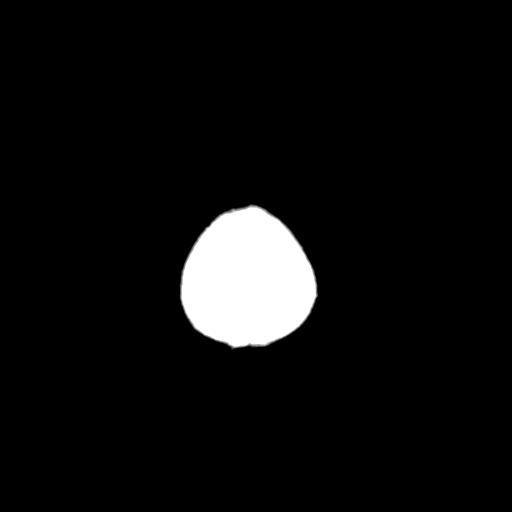
[im 26/28  bone]
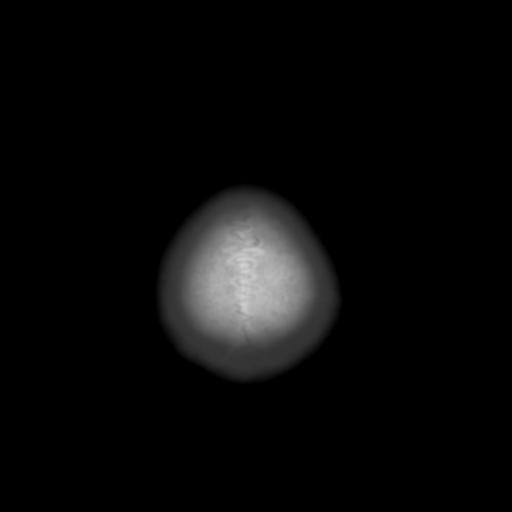

[Series 4: coronal soft tissue · coronal · 0.28mm/px · 3 of 61 slices shown]
[im 21/61  brain]
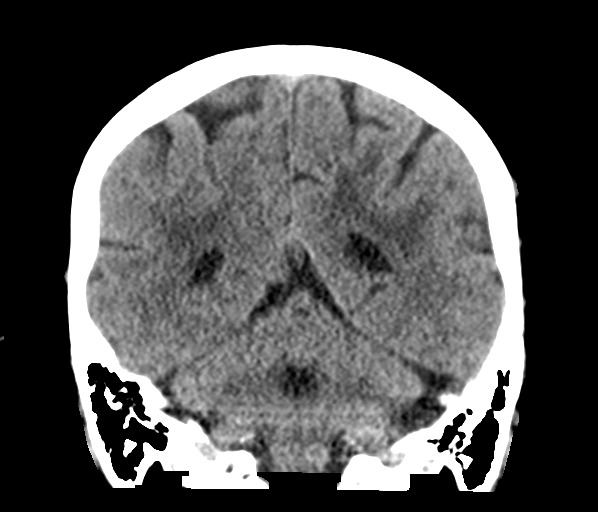
[im 27/61  brain]
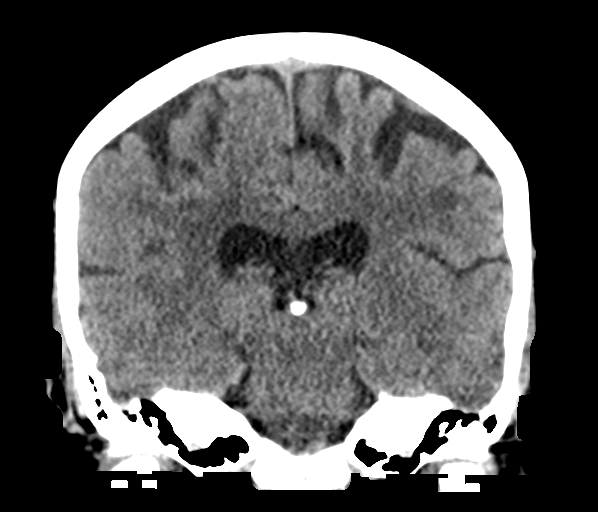
[im 34/61  brain]
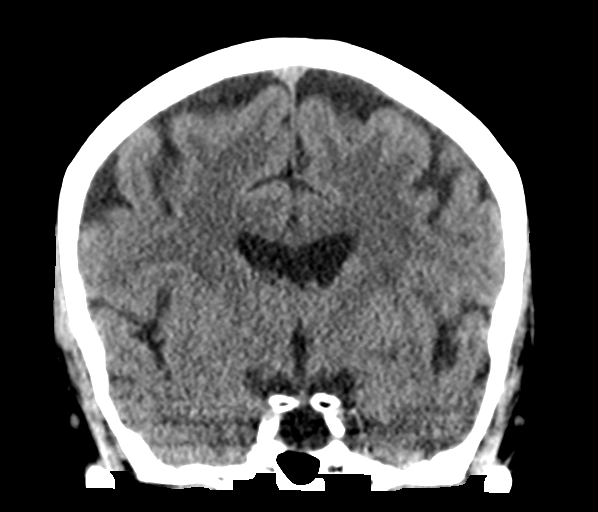

[Series 5: sagittal soft tissue · sagittal · 0.28mm/px · 3 of 49 slices shown]
[im 17/49  brain]
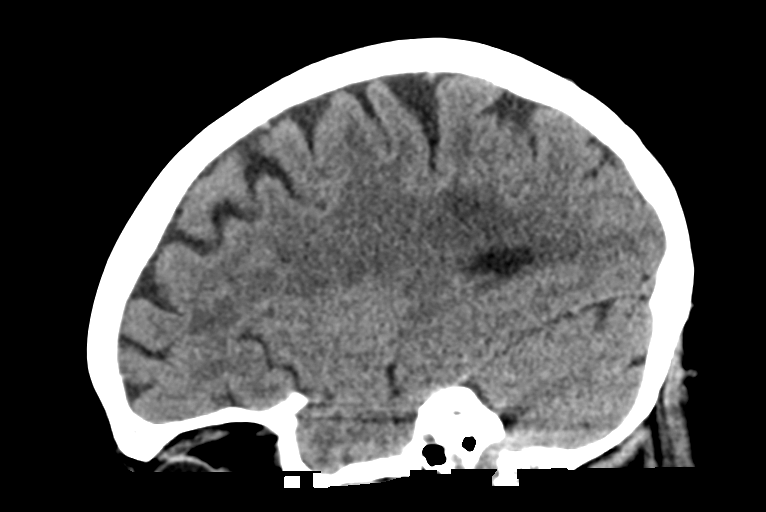
[im 25/49  brain]
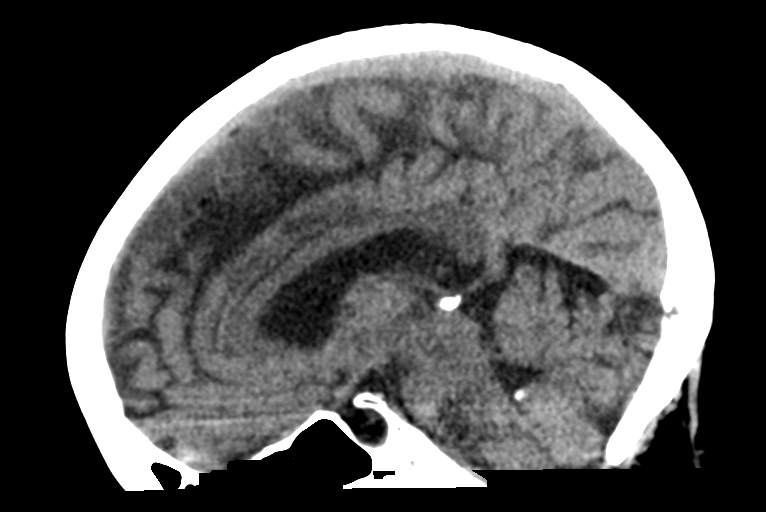
[im 33/49  brain]
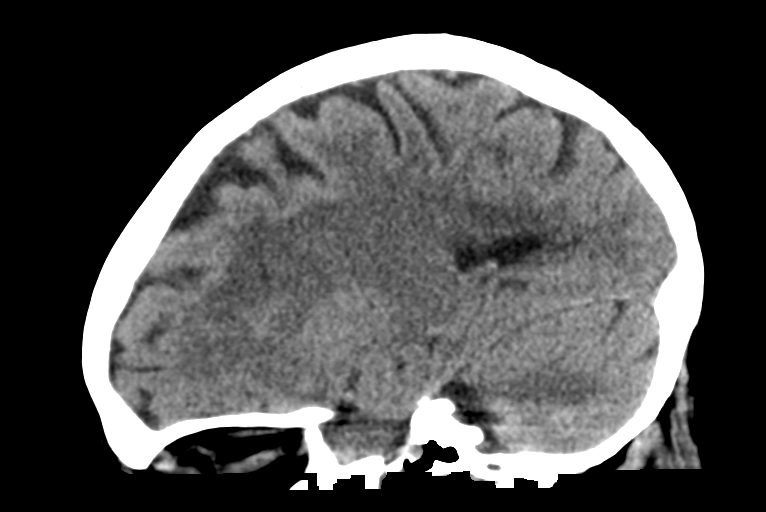

[15 of 45 positions shown; findings below may reference images not displayed]

FINDINGS: Brain: There is no evidence of acute infarct, intracranial
hemorrhage, mass, midline shift, or extra-axial fluid collection.
The ventricles and sulci are within normal limits for age. Patchy
cerebral white matter hypodensities are nonspecific but compatible
with moderate chronic small vessel ischemic disease with overall
extent of white matter disease similar to the prior MRI.

Vascular: Calcified atherosclerosis at the skull base. No hyperdense
vessel.

Skull: No fracture or suspicious osseous lesion.

Sinuses/Orbits: Mild mucosal thickening in the sphenoid sinuses.
Clear mastoid air cells. Unremarkable included orbits.

Other: None.
IMPRESSION: 1. No evidence of acute intracranial abnormality.
2. Moderate chronic small vessel ischemic disease.

## 2019-11-08 MED ORDER — ENOXAPARIN SODIUM 60 MG/0.6ML ~~LOC~~ SOLN
50.0000 mg | SUBCUTANEOUS | Status: DC
  Administered 2019-11-08 – 2019-11-11 (×4): 50 mg via SUBCUTANEOUS
  Filled 2019-11-08 (×5): qty 0.5

## 2019-11-08 MED ORDER — METOPROLOL TARTRATE 5 MG/5ML IV SOLN: 2.5000 mg | Freq: Once | INTRAVENOUS | Status: AC

## 2019-11-08 MED ORDER — METOPROLOL TARTRATE 5 MG/5ML IV SOLN
INTRAVENOUS | Status: AC
  Administered 2019-11-08: 2.5 mg
  Filled 2019-11-08: qty 5

## 2019-11-08 MED ORDER — AMIODARONE IV BOLUS ONLY 150 MG/100ML
150.0000 mg | Freq: Once | INTRAVENOUS | Status: AC
  Administered 2019-11-08: 150 mg via INTRAVENOUS
  Filled 2019-11-08: qty 100

## 2019-11-08 MED ORDER — DILTIAZEM HCL-DEXTROSE 125-5 MG/125ML-% IV SOLN (PREMIX)
5.0000 mg/h | INTRAVENOUS | Status: DC
  Administered 2019-11-08: 5 mg/h via INTRAVENOUS
  Administered 2019-11-09: 15 mg/h via INTRAVENOUS
  Administered 2019-11-09: 10 mg/h via INTRAVENOUS
  Administered 2019-11-10 – 2019-11-11 (×4): 15 mg/h via INTRAVENOUS
  Filled 2019-11-08 (×8): qty 125

## 2019-11-08 NOTE — Progress Notes (Signed)
Reviewed CXR  Multifocal pneumonia

## 2019-11-08 NOTE — Progress Notes (Signed)
Pt's son updated about pt's transfer to [facility]. Visitation policy explained. All questions answered.

## 2019-11-08 NOTE — Progress Notes (Signed)
[place] Progress Note Patient Name: Jacqueline Lucas DOB: Jun 09, 1950 MRN: [id number]   Date of Service  11/08/2019  HPI/Events of Note  AFIB with RVR - Ventricular rate = 110-122. BP = 154/54.  eICU Interventions  Plan: 1. Cardizem IV infusion. Titrate to HR = 65-105.  2. BMP and Mg++ level STAT.      Intervention Category Major Interventions: Arrhythmia - evaluation and management  Sai Zinn Eugene 11/08/2019, 10:14 PM

## 2019-11-08 NOTE — Progress Notes (Signed)
[place] Progress Note Patient Name: Jacqueline Lucas DOB: 1950-08-23 MRN: [id number]   Date of Service  11/08/2019  HPI/Events of Note  AFIB with RVR - Ventricular rate = 190. BP = 107/66. Cardizem IV infusion started about 10 minutes ago.   eICU Interventions  Plan: 1. Amiodarone 15o mg IV over 10 minutes now.      Intervention Category Major Interventions: Arrhythmia - evaluation and management  Doneshia Hill Eugene 11/08/2019, 11:32 PM

## 2019-11-08 NOTE — Progress Notes (Signed)
NAME:  Jacqueline Lucas, MRN:  [id number], DOB:  12/11/1950, LOS: 1 ADMISSION DATE:  10/21/2019, CONSULTATION DATE:  8/20 REFERRING MD:  EDP, CHIEF COMPLAINT:  Dyspnea   Brief History   69 y/o female admitted on 8/19 with severe acute respiratory failure with hypoxemia due to COVID 19 pneumonia.  Past Medical History  Asthma Obesity Hypertension Hyperlipidemia Degenerative disk disease Depression Fibromyalgia Heart murmur GERD Hypothyroidism IDA Insomnia Fe def anemia  Significant Hospital Events   8/20 Admit to [facility] 8/21 failed SBT, oxygenation worsened thorought the day 9/3 awake, following commands, weaning PSV 12/10 9/4 changing abx to zosyn due to worsening resp secretions despite ampicillin  9/10 attempted percutaneous tracheostomy Consults:    Procedures:  8/20 ETT >> 8/22 R PICC  >>  Significant Diagnostic Tests:  8/26 LE doppler > negative for DVT 8/27 TTE> LVEF 65-70%, Grade I DD, Mitral valve normal, RV normal size/function 9/4 CT head > no evidence of acute intracranial abnormality   Micro Data:  8/20 SARS COV 2>>positive 8/20 UC>> neg 8/20 BCx2>> neg 8/24 BCx2 >> neg 8/24 respiratory culture >> Klebsiella 8/30 BCx 2 >> 9/1 trach asp >>klebsiella 9/4 trach asp >> Staph aureus  Antimicrobials:  8/20 Remdesivir >8/24 8/20 baricitinib >> 9/2 8/20 solumedrol > taper started 8/27 >> 8/24 cefepime >>8/31 9/4 cefepime >> 9/7 9/7 Ceftriaxone >  Interim history/subjective:  T-max 100.6 Sleepy, arousable Not following commands  Objective   Blood pressure (!) 154/43, pulse (!) 103, temperature (!) 100.6 F (38.1 C), temperature source Axillary, resp. rate 17, height 5\' 4"  (1.626 m), weight 104 kg, SpO2 92 %.    Vent Mode: PRVC FiO2 (%):  [60 %-100 %] 60 % Set Rate:  [15 bmp] 15 bmp Vt Set:  [440 mL] 440 mL PEEP:  [8 cmH20] 8 cmH20 Plateau Pressure:  [28 cmH20-29 cmH20] 28 cmH20   Intake/Output Summary (Last 24 hours) at 11/08/2019  0846 Last data filed at 11/08/2019 0800 Gross per 24 hour  Intake 1105.81 ml  Output 1090 ml  Net 15.81 ml   Filed Weights   11/04/19 0500 11/05/19 0500 11/08/19 0342  Weight: 90.4 kg 94.3 kg 104 kg    Examination:  General: Appears comfortable HENT: Endotracheal tube in place PULM: Coarse breath sounds, no rales CV: S1-S2 appreciated GI: Bowel sounds appreciated MSK: Edema  neuro: Arousable but sleepy this morning   Resolved Hospital Problem list    Assessment & Plan:   Hypoxic respiratory failure secondary to ARDS secondary to COVID-19 Bacterial pneumonia -Tracheostomy aborted 9/10 -Will consult ENT for tracheostomy -Continue ventilator support -Sedation as needed - COVID-19 pneumonia Off isolation 9/10 Healthcare associated pneumonia -Klebsiella, staph aureus -Tracheal aspirate 9/10 with rare gram-positive cocci  Sedation -Nightly Seroquel -Oxycodone -Low-dose Precedex, fentanyl pushes  Type 2 diabetes -Continue to monitor, currently well controlled  Hypothyroidism -Continue Synthroid  History of depression -On Depakote, Effexor   Best practice:  Diet: tube feeding Pain/Anxiety/Delirium protocol (if indicated): Seroquel, oxycodone VAP protocol (if indicated): yes DVT prophylaxis: lovenox GI prophylaxis: pantoprazole Glucose control: SSI/Levemir Mobility: bed rest Code Status: Full code Family Communication: Will update  Disposition: ICU    The patient is critically ill with multiple organ systems failure and requires high complexity decision making for assessment and support, frequent evaluation and titration of therapies, application of advanced monitoring technologies and extensive interpretation of multiple databases. Critical Care Time devoted to patient care services described in this note independent of APP/resident time (if applicable)  is 32 minutes.  Sherrilyn Rist MD [place] [facility] Personal pager: [phone] If  unanswered, please page CCM On-call: [phone]

## 2019-11-09 ENCOUNTER — Inpatient Hospital Stay (HOSPITAL_COMMUNITY): Payer: Medicare Other

## 2019-11-09 DIAGNOSIS — J181 Lobar pneumonia, unspecified organism: Secondary | ICD-10-CM

## 2019-11-09 LAB — BASIC METABOLIC PANEL
Anion gap: 8 (ref 5–15)
Anion gap: 9 (ref 5–15)
BUN: 19 mg/dL (ref 8–23)
BUN: 20 mg/dL (ref 8–23)
CO2: 30 mmol/L (ref 22–32)
CO2: 30 mmol/L (ref 22–32)
Calcium: 8.6 mg/dL — ABNORMAL LOW (ref 8.9–10.3)
Calcium: 8.6 mg/dL — ABNORMAL LOW (ref 8.9–10.3)
Chloride: 102 mmol/L (ref 98–111)
Chloride: 102 mmol/L (ref 98–111)
Creatinine, Ser: 0.56 mg/dL (ref 0.44–1.00)
Creatinine, Ser: 0.57 mg/dL (ref 0.44–1.00)
GFR calc Af Amer: >60 mL/min (ref >60)
GFR calc Af Amer: >60 mL/min (ref >60)
GFR calc non Af Amer: >60 mL/min (ref >60)
GFR calc non Af Amer: >60 mL/min (ref >60)
Glucose, Bld: 229 mg/dL — ABNORMAL HIGH (ref 70–99)
Glucose, Bld: 180 mg/dL — ABNORMAL HIGH (ref 70–99)
Potassium: 4.6 mmol/L (ref 3.5–5.1)
Potassium: 4.6 mmol/L (ref 3.5–5.1)
Sodium: 140 mmol/L (ref 135–145)
Sodium: 141 mmol/L (ref 135–145)

## 2019-11-09 LAB — GLUCOSE, CAPILLARY
Glucose-Capillary: 149 mg/dL — ABNORMAL HIGH (ref 70–99)
Glucose-Capillary: 150 mg/dL — ABNORMAL HIGH (ref 70–99)
Glucose-Capillary: 157 mg/dL — ABNORMAL HIGH (ref 70–99)
Glucose-Capillary: 166 mg/dL — ABNORMAL HIGH (ref 70–99)
Glucose-Capillary: 180 mg/dL — ABNORMAL HIGH (ref 70–99)
Glucose-Capillary: 186 mg/dL — ABNORMAL HIGH (ref 70–99)

## 2019-11-09 LAB — MAGNESIUM: Magnesium: 1.9 mg/dL (ref 1.7–2.4)

## 2019-11-09 MED ORDER — POLYETHYLENE GLYCOL 3350 17 G PO PACK
17.0000 g | PACK | Freq: Every day | ORAL | Status: DC
  Administered 2019-11-09: 17 g
  Filled 2019-11-09: qty 1

## 2019-11-09 MED ORDER — METOPROLOL TARTRATE 5 MG/5ML IV SOLN
INTRAVENOUS | Status: AC
  Filled 2019-11-09: qty 5

## 2019-11-09 MED ORDER — PHENYLEPHRINE HCL-NACL 10-0.9 MG/250ML-% IV SOLN
0.0000 ug/min | INTRAVENOUS | Status: DC
  Administered 2019-11-10 – 2019-11-11 (×3): 20 ug/min via INTRAVENOUS
  Filled 2019-11-09 (×3): qty 250

## 2019-11-09 MED ORDER — ACETAMINOPHEN 325 MG PO TABS
650.0000 mg | ORAL_TABLET | Freq: Four times a day (QID) | ORAL | Status: DC | PRN
  Administered 2019-11-09 – 2019-11-11 (×4): 650 mg
  Filled 2019-11-09 (×4): qty 2

## 2019-11-09 MED ORDER — METOPROLOL TARTRATE 25 MG/10 ML ORAL SUSPENSION
25.0000 mg | Freq: Two times a day (BID) | ORAL | Status: DC
  Administered 2019-11-09 – 2019-11-10 (×3): 25 mg
  Filled 2019-11-09 (×3): qty 10

## 2019-11-09 MED ORDER — GUAIFENESIN 100 MG/5ML PO SOLN: 5.0000 mL | ORAL | Status: DC | PRN

## 2019-11-09 MED ORDER — METOPROLOL TARTRATE 5 MG/5ML IV SOLN
5.0000 mg | Freq: Once | INTRAVENOUS | Status: AC
  Administered 2019-11-09: 2.5 mg via INTRAVENOUS

## 2019-11-09 NOTE — Progress Notes (Signed)
[place] Progress Note Patient Name: KIMBER FRITTS DOB: Jan 15, 1951 MRN: [id number]   Date of Service  11/09/2019  HPI/Events of Note  Remains febrile in spite of Tylenol and ice packs. Request for cooling blanket.   eICU Interventions  Will order cooling blanket PRN.      Intervention Category Major Interventions: Other:  [name] 11/09/2019, 6:26 AM

## 2019-11-09 NOTE — Progress Notes (Addendum)
[place] Progress Note Patient Name: Jacqueline Lucas DOB: February 01, 1951 MRN: [id number]   Date of Service  11/09/2019  HPI/Events of Note  Multiple issues: 1. Hypoxia - Review of CXR reveals 1. No significant interval change in extensive bilateral pulmonary airspace disease. 2. Endotracheal tube tip about 3.6 cm superior to the carina.   eICU Interventions  Plan: 1. Increase PEEP to 10. 2. Phenylephrine iV infusion. Titrate to MAP >=65.  3. Monitor CVP now and Q 4 hours. CVP = 9.      Intervention Category Major Interventions: Hypoxemia - evaluation and management;Hypotension - evaluation and management  [name] 11/09/2019, 11:47 PM

## 2019-11-09 NOTE — Progress Notes (Signed)
NAME:  Jacqueline Lucas, MRN:  [id number], DOB:  [birth year], LOS: 23 ADMISSION DATE:  10/18/2019, CONSULTATION DATE:  8/20 REFERRING MD:  EDP, CHIEF COMPLAINT:  Dyspnea   Brief History   69 y/o female admitted on 8/19 with severe acute respiratory failure with hypoxemia due to COVID 19 pneumonia.  Past Medical History  Asthma Obesity Hypertension Hyperlipidemia Degenerative disk disease Depression Fibromyalgia Heart murmur GERD Hypothyroidism IDA Insomnia Fe def anemia  Significant Hospital Events   8/20 Admit to [facility] 8/21 failed SBT, oxygenation worsened thorought the day 9/3 awake, following commands, weaning PSV 12/10 9/4 changing abx to zosyn due to worsening resp secretions despite ampicillin  9/10 attempted percutaneous tracheostomy  Consults:    Procedures:  8/20 ETT >> 8/22 R PICC  >>  Significant Diagnostic Tests:  8/26 LE doppler > negative for DVT 8/27 TTE> LVEF 65-70%, Grade I DD, Mitral valve normal, RV normal size/function 9/4 CT head > no evidence of acute intracranial abnormality   Micro Data:  8/20 SARS COV 2>>positive 8/20 UC>> neg 8/20 BCx2>> neg 8/24 BCx2 >> neg 8/24 respiratory culture >> Klebsiella 8/30 BCx 2 >> 9/1 trach asp >>klebsiella 9/4 trach asp >> Staph aureus  Antimicrobials:  8/20 Remdesivir >8/24 8/20 baricitinib >> 9/2 8/20 solumedrol > taper started 8/27 >> 8/24 cefepime >>8/31 9/4 cefepime >> 9/7 9/7 Ceftriaxone >  Interim history/subjective:  Fever with a T-max of 101.7 Tachyarrhythmia overnight Not following commands  Objective   Blood pressure (!) 127/48, pulse 95, temperature (!) 100.5 F (38.1 C), temperature source Oral, resp. rate 14, height 5\' 4"  (1.626 m), weight 106.4 kg, SpO2 93 %.    Vent Mode: PRVC FiO2 (%):  [60 %-100 %] 80 % Set Rate:  [15 bmp] 15 bmp Vt Set:  [440 mL] 440 mL PEEP:  [8 cmH20] 8 cmH20 Plateau Pressure:  [5 UUV25-36 cmH20] 31 cmH20   Intake/Output Summary (Last 24 hours) at  11/09/2019 1048 Last data filed at 11/09/2019 1000 Gross per 24 hour  Intake 1765.54 ml  Output 900 ml  Net 865.54 ml   Filed Weights   11/05/19 0500 11/08/19 0342 11/09/19 0500  Weight: 94.3 kg 104 kg 106.4 kg    Examination: General: She does appear comfortable HENT: Endotracheal tube in place, moist oral mucosa PULM: Coarse breath sounds bilaterally, no rales appreciated, decreased air movement at the bases CV: S1-S2 appreciated GI: Soft, bowel sounds appreciated MSK: Edema  neuro: [facility] Problem list    Assessment & Plan:  Hypoxic respiratory failure secondary to ARDS secondary to COVID-19 Bacterial pneumonia -Tracheostomy aborted 9/10 -We will consult ENT for tracheostomy -Continue ventilator support -Sedation as needed  COVID-19 pneumonia, outside isolation window from 9/10 Healthcare associated pneumonia -Tracheal aspirate 9/10 with rare gram-positive cocci-no identified organisms as of yet -Day 6 of ceftriaxone today, had 3 days of cefepime prior  Sedation -Oxycodone -Seroquel -Fentanyl pushes, low-dose Precedex  Fever -No specific organism -will repeat blood cultures  Hypothyroidism -Continue Synthroid  History of depression -Depakote, Effexor  Type 2 diabetes -Continue to monitor  Best practice:  Diet: Continue tube feeding Pain/Anxiety/Delirium protocol (if indicated): Seroquel, oxycodone VAP protocol (if indicated): yes DVT prophylaxis: lovenox GI prophylaxis: pantoprazole Glucose control: SSI/Levemir Mobility: bed rest Code Status: Full code Family Communication: Will update  Disposition: ICU   The patient is critically ill with multiple organ systems failure and requires high complexity decision making for assessment and support, frequent evaluation and titration of therapies, application of  advanced monitoring technologies and extensive interpretation of multiple databases. Critical Care Time devoted to patient care  services described in this note independent of APP/resident time (if applicable)  Is 30 minutes.   Sherrilyn Rist MD [facility] Personal pager: [phone] If unanswered, please page CCM On-call: [phone]

## 2019-11-09 NOTE — Progress Notes (Signed)
[facility] Progress Note Patient Name: EZRA MARQUESS DOB: 08/02/1950 MRN: [id number]   Date of Service  11/09/2019  HPI/Events of Note  Hypoxia - O2 requirement increased from 60% to 100%.   eICU Interventions  Plan: 1. Portable CXR STAT. 2. If no pneumothorax, increase PEEP.     Intervention Category Major Interventions: Hypoxemia - evaluation and management  [name] 11/09/2019, 8:39 PM

## 2019-11-10 ENCOUNTER — Ambulatory Visit (INDEPENDENT_AMBULATORY_CARE_PROVIDER_SITE_OTHER): Payer: Self-pay | Admitting: Otolaryngology

## 2019-11-10 ENCOUNTER — Encounter (HOSPITAL_COMMUNITY): Payer: Self-pay | Admitting: Certified Registered"

## 2019-11-10 DIAGNOSIS — J9620 Acute and chronic respiratory failure, unspecified whether with hypoxia or hypercapnia: Secondary | ICD-10-CM

## 2019-11-10 DIAGNOSIS — J9690 Respiratory failure, unspecified, unspecified whether with hypoxia or hypercapnia: Secondary | ICD-10-CM

## 2019-11-10 LAB — CULTURE, RESPIRATORY W GRAM STAIN

## 2019-11-10 LAB — POCT I-STAT 7, (LYTES, BLD GAS, ICA,H+H)
Acid-Base Excess: 6 mmol/L — ABNORMAL HIGH (ref 0.0–2.0)
Bicarbonate: 32.3 mmol/L — ABNORMAL HIGH (ref 20.0–28.0)
Calcium, Ion: 1.26 mmol/L (ref 1.15–1.40)
HCT: 23 % — ABNORMAL LOW (ref 36.0–46.0)
Hemoglobin: 7.8 g/dL — ABNORMAL LOW (ref 12.0–15.0)
O2 Saturation: 83 %
Patient temperature: 100.7
Potassium: 6.2 mmol/L — ABNORMAL HIGH (ref 3.5–5.1)
Sodium: 141 mmol/L (ref 135–145)
TCO2: 34 mmol/L — ABNORMAL HIGH (ref 22–32)
pCO2 arterial: 56.8 mmHg — ABNORMAL HIGH (ref 32.0–48.0)
pH, Arterial: 7.367 (ref 7.350–7.450)
pO2, Arterial: 53 mmHg — ABNORMAL LOW (ref 83.0–108.0)

## 2019-11-10 LAB — CBC WITH DIFFERENTIAL/PLATELET
Abs Immature Granulocytes: 0.37 10*3/uL — ABNORMAL HIGH (ref 0.00–0.07)
Basophils Absolute: 0 10*3/uL (ref 0.0–0.1)
Basophils Relative: 0 %
Eosinophils Absolute: 0.6 10*3/uL — ABNORMAL HIGH (ref 0.0–0.5)
Eosinophils Relative: 6 %
HCT: 24.4 % — ABNORMAL LOW (ref 36.0–46.0)
Hemoglobin: 7.2 g/dL — ABNORMAL LOW (ref 12.0–15.0)
Immature Granulocytes: 4 %
Lymphocytes Relative: 8 %
Lymphs Abs: 0.9 10*3/uL (ref 0.7–4.0)
MCH: 30.8 pg (ref 26.0–34.0)
MCHC: 29.5 g/dL — ABNORMAL LOW (ref 30.0–36.0)
MCV: 104.3 fL — ABNORMAL HIGH (ref 80.0–100.0)
Monocytes Absolute: 0.4 10*3/uL (ref 0.1–1.0)
Monocytes Relative: 4 %
Neutro Abs: 8.1 10*3/uL — ABNORMAL HIGH (ref 1.7–7.7)
Neutrophils Relative %: 78 %
Platelets: 240 10*3/uL (ref 150–400)
RBC: 2.34 MIL/uL — ABNORMAL LOW (ref 3.87–5.11)
RDW: 14.6 % (ref 11.5–15.5)
WBC: 10.3 10*3/uL (ref 4.0–10.5)
nRBC: 0 % (ref 0.0–0.2)

## 2019-11-10 LAB — BASIC METABOLIC PANEL
Anion gap: 6 (ref 5–15)
BUN: 24 mg/dL — ABNORMAL HIGH (ref 8–23)
CO2: 31 mmol/L (ref 22–32)
Calcium: 8.4 mg/dL — ABNORMAL LOW (ref 8.9–10.3)
Chloride: 103 mmol/L (ref 98–111)
Creatinine, Ser: 0.62 mg/dL (ref 0.44–1.00)
GFR calc Af Amer: >60 mL/min (ref >60)
GFR calc non Af Amer: >60 mL/min (ref >60)
Glucose, Bld: 219 mg/dL — ABNORMAL HIGH (ref 70–99)
Potassium: 5.5 mmol/L — ABNORMAL HIGH (ref 3.5–5.1)
Sodium: 140 mmol/L (ref 135–145)

## 2019-11-10 LAB — GLUCOSE, CAPILLARY
Glucose-Capillary: 227 mg/dL — ABNORMAL HIGH (ref 70–99)
Glucose-Capillary: 175 mg/dL — ABNORMAL HIGH (ref 70–99)
Glucose-Capillary: 158 mg/dL — ABNORMAL HIGH (ref 70–99)
Glucose-Capillary: 116 mg/dL — ABNORMAL HIGH (ref 70–99)
Glucose-Capillary: 189 mg/dL — ABNORMAL HIGH (ref 70–99)
Glucose-Capillary: 284 mg/dL — ABNORMAL HIGH (ref 70–99)

## 2019-11-10 LAB — PREPARE RBC (CROSSMATCH)

## 2019-11-10 MED ORDER — CHLORHEXIDINE GLUCONATE CLOTH 2 % EX PADS
6.0000 | MEDICATED_PAD | Freq: Once | CUTANEOUS | Status: AC
  Administered 2019-11-10: 6 via TOPICAL

## 2019-11-10 MED ORDER — LINEZOLID 600 MG/300ML IV SOLN
600.0000 mg | Freq: Two times a day (BID) | INTRAVENOUS | Status: DC
  Administered 2019-11-10 – 2019-11-12 (×5): 600 mg via INTRAVENOUS
  Filled 2019-11-10 (×6): qty 300

## 2019-11-10 MED ORDER — SODIUM CHLORIDE 0.9% IV SOLUTION: Freq: Once | INTRAVENOUS | Status: AC

## 2019-11-10 MED ORDER — SODIUM CHLORIDE 0.9 % IV SOLN: INTRAVENOUS | Status: DC | PRN

## 2019-11-10 MED ORDER — METOPROLOL TARTRATE 5 MG/5ML IV SOLN
5.0000 mg | Freq: Four times a day (QID) | INTRAVENOUS | Status: DC | PRN
  Filled 2019-11-10 (×2): qty 5

## 2019-11-10 MED ORDER — CEFAZOLIN SODIUM-DEXTROSE 2-4 GM/100ML-% IV SOLN: 2.0000 g | INTRAVENOUS | Status: DC

## 2019-11-10 MED ORDER — SODIUM CHLORIDE 0.9 % IV SOLN
2.0000 g | INTRAVENOUS | Status: AC
  Administered 2019-11-10: 2 g via INTRAVENOUS
  Filled 2019-11-10: qty 20

## 2019-11-10 MED ORDER — METOPROLOL TARTRATE 25 MG/10 ML ORAL SUSPENSION
50.0000 mg | Freq: Two times a day (BID) | ORAL | Status: DC
  Administered 2019-11-10 – 2019-11-12 (×4): 50 mg
  Filled 2019-11-10 (×4): qty 20

## 2019-11-10 MED ORDER — INSULIN DETEMIR 100 UNIT/ML ~~LOC~~ SOLN
15.0000 [IU] | Freq: Two times a day (BID) | SUBCUTANEOUS | Status: DC
  Administered 2019-11-10 – 2019-11-11 (×4): 15 [IU] via SUBCUTANEOUS
  Filled 2019-11-10 (×6): qty 0.15

## 2019-11-10 MED ORDER — SODIUM ZIRCONIUM CYCLOSILICATE 5 G PO PACK
5.0000 g | PACK | Freq: Once | ORAL | Status: AC
  Administered 2019-11-10: 5 g via ORAL
  Filled 2019-11-10: qty 1

## 2019-11-10 MED ORDER — CHLORHEXIDINE GLUCONATE CLOTH 2 % EX PADS
6.0000 | MEDICATED_PAD | Freq: Once | CUTANEOUS | Status: AC
  Administered 2019-11-11: 6 via TOPICAL

## 2019-11-10 NOTE — Progress Notes (Signed)
Obtain consent for tracheostomy and consent for blood transfusion from patient's son Jacqueline Lucas

## 2019-11-10 NOTE — Progress Notes (Signed)
Low-grade fever persists  Tracheal aspirate does show few MRSA Increasing ventilator requirement  Blood cultures negative to date  I will cover for MRSA with linezolid in the context of HCAP

## 2019-11-10 NOTE — Consult Note (Signed)
Jacqueline for Consult: Open tracheostomy Referring Physician: Dr. Velta Addison is an 69 y.o. female.  HPI: Patient was admitted on 10/16/2019 secondary to Covid and pneumonia with worsening pulmonary status.  She was subsequently intubated a couple of days post admission.  A percutaneous tracheostomy was attempted on 11/07/2019 but was unsuccessful.  I was subsequently consulted on 11/10/2019 to perform open tracheostomy.  Past Medical History:  Diagnosis Date  . Allergy   . Allergy to adhesive tape    Allergy to paper tape as well  . Anemia   . Anginal pain ([place])   . Anxiety   . Arthritis   . Asthma   . Bronchitis   . Cataract   . Chronic nausea   . Claustrophobia   . DDD (degenerative disc disease), lumbar   . Depression   . Dry eyes    uses Restasis  . Fibromyalgia   . GERD (gastroesophageal reflux disease)   . H/O bladder infections   . Heart murmur   . Hematuria   . Hyperlipidemia   . Hypertension   . Hypothyroidism   . IBS (irritable bowel syndrome)   . IDA (iron deficiency anemia) 05/13/2019  . Insomnia   . Lumbar neuritis   . Migraines   . Obesity   . Osteoporosis   . Palpitation   . RLS (restless legs syndrome)   . Shoulder fracture, right   . Sicca ([place])   . Tachycardia   . Thyroid disease   . Wears dentures    partial lower    Past Surgical History:  Procedure Laterality Date  . ABDOMINAL HYSTERECTOMY     due to cancer of ovaries  . BACK SURGERY N/A 2014   x 5  . BACK SURGERY  12/2017  . CARDIAC CATHETERIZATION     no PCI; 12/18/12 ([facility], Dr. Marcello Moores): EP study with ablation. No coronary angiography done then.  [name] CATARACT EXTRACTION W/PHACO Right 04/01/2019   Procedure: CATARACT EXTRACTION PHACO AND INTRAOCULAR LENS PLACEMENT (IOC) RIGHT  TORIC 6.19 00:40.1;  Surgeon: Birder Robson, MD;  Location: [facility];  Service: Ophthalmology;  Laterality: Right;  . CATARACT EXTRACTION W/PHACO Left 04/29/2019   Procedure: CATARACT  EXTRACTION PHACO AND INTRAOCULAR LENS PLACEMENT (IOC)  LEFT TORIC LENS 4.52  00:29.1;  Surgeon: Birder Robson, MD;  Location: [facility];  Service: Ophthalmology;  Laterality: Left;  . COLONOSCOPY W/ POLYPECTOMY    . COLONOSCOPY WITH PROPOFOL N/A 02/04/2016   Procedure: COLONOSCOPY WITH PROPOFOL;  Surgeon: Robert Bellow, MD;  Location[facility] ENDOSCOPY;  Service: Endoscopy;  Laterality: N/A;  . ESOPHAGOGASTRODUODENOSCOPY (EGD) WITH PROPOFOL N/A 02/04/2016   Procedure: ESOPHAGOGASTRODUODENOSCOPY (EGD) WITH PROPOFOL;  Surgeon: Robert Bellow, MD;  Location: [facility] ENDOSCOPY;  Service: Endoscopy;  Laterality: N/A;  . FINGER ARTHROSCOPY WITH CARPOMETACARPEL ([place]) ARTHROPLASTY Left 05/01/2016   Dr. Erlinda Hong  . GIVENS CAPSULE STUDY N/A 05/02/2016   Procedure: GIVENS CAPSULE STUDY;  Surgeon: Manya Silvas, MD;  Location: [facility] ENDOSCOPY;  Service: Endoscopy;  Laterality: N/A;  . HAND SURGERY    . HEMORRHOID SURGERY    . HIP ARTHROPLASTY    . JOINT REPLACEMENT Bilateral    knee  . KNEE ARTHROPLASTY    . REPLACEMENT TOTAL KNEE Left   . SHOULDER ARTHROSCOPY Left 09/09/2018   Procedure: LEFT SHOULDER ARTHROSCOPY, BICEPS TENOTOMY;  Surgeon: Meredith Pel, MD;  Location: [facility];  Service: Orthopedics;  Laterality: Left;  . SPINE SURGERY     x  2  . tendonititis     right elbow, right wrist  . TOTAL HIP ARTHROPLASTY Left 2013  . TOTAL HIP ARTHROPLASTY Right 08/16/2015   Procedure: TOTAL HIP ARTHROPLASTY ANTERIOR APPROACH;  Surgeon: Meredith Pel, MD;  Location: [place];  Service: Orthopedics;  Laterality: Right;  . TOTAL KNEE ARTHROPLASTY Right 03/16/2015   Procedure: RIGHT TOTAL KNEE ARTHROPLASTY, PCL SACRIFICING;  Surgeon: Meredith Pel, MD;  Location: [place];  Service: Orthopedics;  Laterality: Right;  . TRIGGER FINGER RELEASE  08/18/2019    Social History:  reports that she has never smoked. She has never used smokeless tobacco. She reports that she does not  drink alcohol and does not use drugs.  Allergies:  Allergies  Allergen Reactions  . Morphine Nausea And Vomiting and Other (See Comments)    Ok with anti-nausea medication  . Lyrica [Pregabalin] Other (See Comments)    Joint swelling  . Metformin And Related Diarrhea  . Adhesive [Tape] Rash    Please use paper tape  . Diazepam Nausea And Vomiting and Other (See Comments)    Can take with anti-nausea medication  . Latex Rash and Other (See Comments)    "Band-Aid glue"  . Rash Away [Petrolatum-Zinc Oxide] Swelling and Rash  . Salicylates Other (See Comments)    Headaches    Medications: I have reviewed the patient's current medications.  Results for orders placed or performed during the hospital encounter of 10/27/2019 (from the past 48 hour(s))  Glucose, capillary     Status: Abnormal   Collection Time: 11/08/19 11:24 AM  Result Value Ref Range   Glucose-Capillary 116 (H) 70 - 99 mg/dL    Comment: Glucose reference range applies only to samples taken after fasting for at least 8 hours.  Glucose, capillary     Status: Abnormal   Collection Time: 11/08/19  3:22 PM  Result Value Ref Range   Glucose-Capillary 121 (H) 70 - 99 mg/dL    Comment: Glucose reference range applies only to samples taken after fasting for at least 8 hours.  Glucose, capillary     Status: Abnormal   Collection Time: 11/08/19  8:45 PM  Result Value Ref Range   Glucose-Capillary 138 (H) 70 - 99 mg/dL    Comment: Glucose reference range applies only to samples taken after fasting for at least 8 hours.  Magnesium     Status: None   Collection Time: 11/08/19 11:26 PM  Result Value Ref Range   Magnesium 1.9 1.7 - 2.4 mg/dL    Comment: Performed at [facility], [place] [address], [place], [place] [address]  Basic metabolic panel     Status: Abnormal   Collection Time: 11/08/19 11:26 PM  Result Value Ref Range   Sodium 140 135 - 145 mmol/L   Potassium 4.6 3.5 - 5.1 mmol/L   Chloride 102 98 - 111 mmol/L    CO2 30 22 - 32 mmol/L   Glucose, Bld 229 (H) 70 - 99 mg/dL    Comment: Glucose reference range applies only to samples taken after fasting for at least 8 hours.   BUN 19 8 - 23 mg/dL   Creatinine, Ser 0.56 0.44 - 1.00 mg/dL   Calcium 8.6 (L) 8.9 - 10.3 mg/dL   GFR calc non Af Amer >60 >60 mL/min   GFR calc Af Amer >60 >60 mL/min   Anion gap 8 5 - 15    Comment: Performed at [facility] [place],  [place] [address]  Glucose, capillary     Status: Abnormal   Collection Time: 11/08/19 11:33 PM  Result Value Ref Range   Glucose-Capillary 203 (H) 70 - 99 mg/dL    Comment: Glucose reference range applies only to samples taken after fasting for at least 8 hours.  Glucose, capillary     Status: Abnormal   Collection Time: 11/09/19  4:31 AM  Result Value Ref Range   Glucose-Capillary 150 (H) 70 - 99 mg/dL    Comment: Glucose reference range applies only to samples taken after fasting for at least 8 hours.  Basic metabolic panel     Status: Abnormal   Collection Time: 11/09/19  5:28 AM  Result Value Ref Range   Sodium 141 135 - 145 mmol/L   Potassium 4.6 3.5 - 5.1 mmol/L   Chloride 102 98 - 111 mmol/L   CO2 30 22 - 32 mmol/L   Glucose, Bld 180 (H) 70 - 99 mg/dL    Comment: Glucose reference range applies only to samples taken after fasting for at least 8 hours.   BUN 20 8 - 23 mg/dL   Creatinine, Ser 0.57 0.44 - 1.00 mg/dL   Calcium 8.6 (L) 8.9 - 10.3 mg/dL   GFR calc non Af Amer >60 >60 mL/min   GFR calc Af Amer >60 >60 mL/min   Anion gap 9 5 - 15    Comment: Performed at [facility] [address] [place], [place] [address]  Glucose, capillary     Status: Abnormal   Collection Time: 11/09/19  7:25 AM  Result Value Ref Range   Glucose-Capillary 157 (H) 70 - 99 mg/dL    Comment: Glucose reference range applies only to samples taken after fasting for at least 8 hours.  Glucose, capillary     Status: Abnormal   Collection Time: 11/09/19 11:31 AM  Result  Value Ref Range   Glucose-Capillary 166 (H) 70 - 99 mg/dL    Comment: Glucose reference range applies only to samples taken after fasting for at least 8 hours.  Culture, blood (routine x 2)     Status: None (Preliminary result)   Collection Time: 11/09/19 11:52 AM   Specimen: BLOOD  Result Value Ref Range   Specimen Description BLOOD LEFT ANTECUBITAL    Special Requests      BOTTLES DRAWN AEROBIC AND ANAEROBIC Blood Culture results may not be optimal due to an inadequate volume of blood received in culture bottles   Culture      NO GROWTH < 12 HOURS Performed at [facility] [address], [place], [place] [address]    Report Status PENDING   Culture, blood (routine x 2)     Status: None (Preliminary result)   Collection Time: 11/09/19 11:56 AM   Specimen: BLOOD LEFT HAND  Result Value Ref Range   Specimen Description BLOOD LEFT HAND    Special Requests      BOTTLES DRAWN AEROBIC ONLY Blood Culture results may not be optimal due to an inadequate volume of blood received in culture bottles   Culture      NO GROWTH < 12 HOURS Performed at [facility] [address], [place], [place] [address]    Report Status PENDING   Glucose, capillary     Status: Abnormal   Collection Time: 11/09/19  3:26 PM  Result Value Ref Range   Glucose-Capillary 149 (H) 70 - 99 mg/dL    Comment: Glucose reference range  applies only to samples taken after fasting for at least 8 hours.  Glucose, capillary     Status: Abnormal   Collection Time: 11/09/19  7:06 PM  Result Value Ref Range   Glucose-Capillary 180 (H) 70 - 99 mg/dL    Comment: Glucose reference range applies only to samples taken after fasting for at least 8 hours.  Glucose, capillary     Status: Abnormal   Collection Time: 11/09/19 11:45 PM  Result Value Ref Range   Glucose-Capillary 186 (H) 70 - 99 mg/dL    Comment: Glucose reference range applies only to samples taken after fasting for at least 8 hours.  Basic metabolic  panel     Status: Abnormal   Collection Time: 11/10/19  3:34 AM  Result Value Ref Range   Sodium 140 135 - 145 mmol/L   Potassium 5.5 (H) 3.5 - 5.1 mmol/L   Chloride 103 98 - 111 mmol/L   CO2 31 22 - 32 mmol/L   Glucose, Bld 219 (H) 70 - 99 mg/dL    Comment: Glucose reference range applies only to samples taken after fasting for at least 8 hours.   BUN 24 (H) 8 - 23 mg/dL   Creatinine, Ser 0.62 0.44 - 1.00 mg/dL   Calcium 8.4 (L) 8.9 - 10.3 mg/dL   GFR calc non Af Amer >60 >60 mL/min   GFR calc Af Amer >60 >60 mL/min   Anion gap 6 5 - 15    Comment: Performed at [facility] [address] [place], [place] [address]  CBC with Differential/Platelet     Status: Abnormal   Collection Time: 11/10/19  3:34 AM  Result Value Ref Range   WBC 10.3 4.0 - 10.5 K/uL   RBC 2.34 (L) 3.87 - 5.11 MIL/uL   Hemoglobin 7.2 (L) 12.0 - 15.0 g/dL   HCT 24.4 (L) 36 - 46 %   MCV 104.3 (H) 80.0 - 100.0 fL   MCH 30.8 26.0 - 34.0 pg   MCHC 29.5 (L) 30.0 - 36.0 g/dL   RDW 14.6 11.5 - 15.5 %   Platelets 240 150 - 400 K/uL   nRBC 0.0 0.0 - 0.2 %   Neutrophils Relative % 78 %   Neutro Abs 8.1 (H) 1.7 - 7.7 K/uL   Lymphocytes Relative 8 %   Lymphs Abs 0.9 0.7 - 4.0 K/uL   Monocytes Relative 4 %   Monocytes Absolute 0.4 0 - 1 K/uL   Eosinophils Relative 6 %   Eosinophils Absolute 0.6 (H) 0 - 0 K/uL   Basophils Relative 0 %   Basophils Absolute 0.0 0 - 0 K/uL   Immature Granulocytes 4 %   Abs Immature Granulocytes 0.37 (H) 0.00 - 0.07 K/uL    Comment: Performed at [facility] [address] [place], [place] [address]  Glucose, capillary     Status: Abnormal   Collection Time: 11/10/19  3:35 AM  Result Value Ref Range   Glucose-Capillary 227 (H) 70 - 99 mg/dL    Comment: Glucose reference range applies only to samples taken after fasting for at least 8 hours.  Glucose, capillary     Status: Abnormal   Collection Time: 11/10/19 10:04 AM  Result Value Ref Range   Glucose-Capillary 175  (H) 70 - 99 mg/dL    Comment: Glucose reference range applies only to samples taken after fasting for at least 8 hours.    DG CHEST PORT 1 VIEW  Result Date: 11/09/2019  CLINICAL DATA:  Hypoxia EXAM: PORTABLE CHEST 1 VIEW COMPARISON:  11/08/2019, 11/07/2019, 11/06/2019 FINDINGS: Endotracheal tube tip is about 3.6 cm superior to the carina. Right upper extremity central venous catheter tip over the SVC. Esophageal tube tip is below the diaphragm but not well demonstrated. Extensive bilateral pulmonary airspace disease without significant change. Stable cardiomediastinal silhouette with aortic atherosclerosis. No pneumothorax IMPRESSION: 1. No significant interval change in extensive bilateral pulmonary airspace disease. 2. Endotracheal tube tip about 3.6 cm superior to the carina. Electronically Signed   By: Donavan Foil M.D.   On: 11/09/2019 20:57   DG CHEST PORT 1 VIEW  Result Date: 11/08/2019 CLINICAL DATA:  Shortness of breath, diagnosed as COVID-19 positive on 10/01/2019 EXAM: PORTABLE CHEST 1 VIEW COMPARISON:  Portable exam 1514 hours compared to 11/07/2019 FINDINGS: Tip of endotracheal tube projects 5.0 cm above carina. Feeding tube extends into stomach. RIGHT arm PICC line tip projects over SVC. Upper normal size of cardiac silhouette. Atherosclerotic calcification aorta. Patchy BILATERAL airspace infiltrates consistent with multifocal pneumonia, unchanged. No pleural effusion or pneumothorax. IMPRESSION: Persistent BILATERAL pulmonary infiltrates consistent with multifocal pneumonia. No significant interval change. Electronically Signed   By: Lavonia Dana M.D.   On: 11/08/2019 15:33    ROS: Patient not responsive  Hemoglobin 7.2 hematocrit 24.4   platelets 240,000   PE: Patient is intubated and nonresponsive. She has a thick neck with a small incision with 2 sutures midline neck just above the suprasternal notch.  The cricoid can be palpated but is deep in the subcutaneous  tissue.  Lucas/Plan: Acute on chronic respiratory failure History of Covid and pneumonia.  I will plan on scheduling tracheostomy for tomorrow if possible.  Melony Overly 11/10/2019, 10:47 AM

## 2019-11-10 NOTE — Procedures (Signed)
Arterial Catheter Insertion Procedure Note  Jacqueline Lucas  [id number]  Oct 01, 1950  Date:11/10/19  Time:12:09 PM    Provider Performing: Judith Part    Procedure: Insertion of Arterial Line [phone]) without US guidance  Indication(s) Blood pressure monitoring and/or need for frequent ABGs  Consent Unable to obtain consent due to emergent nature of procedure.  Anesthesia None   Time Out Verified patient identification, verified procedure, site/side was marked, verified correct patient position, special equipment/implants available, medications/allergies/relevant history reviewed, required imaging and test results available.   Sterile Technique Maximal sterile technique including full sterile barrier drape, hand hygiene, sterile gown, sterile gloves, mask, hair covering, sterile ultrasound probe cover (if used).   Procedure Description Area of catheter insertion was cleaned with chlorhexidine and draped in sterile fashion. Without real-time ultrasound guidance an arterial catheter was placed into the left radial artery.  Appropriate arterial tracings confirmed on monitor.     Complications/Tolerance None; patient tolerated the procedure well.   EBL Minimal   Specimen(s) None

## 2019-11-10 NOTE — Progress Notes (Addendum)
NAME:  Jacqueline Lucas, MRN:  [id number], DOB:  [birth year], LOS: 24 ADMISSION DATE:  10/25/2019, CONSULTATION DATE:  8/20 REFERRING MD:  EDP, CHIEF COMPLAINT:  Dyspnea   Brief History   69 y/o female admitted on 8/19 with severe acute respiratory failure with hypoxemia due to COVID 19 pneumonia.  Past Medical History  Asthma Obesity Hypertension Hyperlipidemia Degenerative disk disease Depression Fibromyalgia Heart murmur GERD Hypothyroidism IDA Insomnia Fe def anemia  Significant Hospital Events   8/20 Admit to [facility] 8/21 failed SBT, oxygenation worsened thorought the day 9/3 awake, following commands, weaning PSV 12/10 9/4 changing abx to zosyn due to worsening resp secretions despite ampicillin  9/10 attempted percutaneous tracheostomy  Consults:    Procedures:  8/20 ETT >> 8/22 R PICC  >>  Significant Diagnostic Tests:  8/26 LE doppler > negative for DVT 8/27 TTE> LVEF 65-70%, Grade I DD, Mitral valve normal, RV normal size/function 9/4 CT head > no evidence of acute intracranial abnormality   Micro Data:  8/20 SARS COV 2>>positive 8/20 UC>> neg 8/20 BCx2>> neg 8/24 BCx2 >> neg 8/24 respiratory culture >> Klebsiella 8/30 BCx 2 >> 9/1 trach asp >>klebsiella 9/4 trach asp >> Staph aureus  Antimicrobials:  8/20 Remdesivir >8/24 8/20 baricitinib >> 9/2 8/20 solumedrol > taper started 8/27 >> 8/24 cefepime >>8/31 9/4 cefepime >> 9/7 9/7 Ceftriaxone >  Interim history/subjective:   Hypoxic overnight, requiring increase in FiO2 from 60 to 100%. CXR without pneumothorax, PEEP increased to 10. Was also hypotensive which improved with initiation of phenylephrine.   Objective   Blood pressure (!) 134/50, pulse (!) 113, temperature 98.6 F (37 C), temperature source Axillary, resp. rate 17, height 5\' 4"  (1.626 m), weight 107.8 kg, SpO2 91 %. CVP:  [9 mmHg] 9 mmHg  Vent Mode: PRVC FiO2 (%):  [80 %-100 %] 100 % Set Rate:  [15 bmp] 15 bmp Vt Set:  [440  mL] 440 mL PEEP:  [8 cmH20-10 cmH20] 10 cmH20 Pressure Support:  [12 cmH20] 12 cmH20 Plateau Pressure:  [24 cmH20-25 cmH20] 24 cmH20   Intake/Output Summary (Last 24 hours) at 11/10/2019 0831 Last data filed at 11/10/2019 0700 Gross per 24 hour  Intake 2168.33 ml  Output 920 ml  Net 1248.33 ml   Filed Weights   11/08/19 0342 11/09/19 0500 11/10/19 0500  Weight: 104 kg 106.4 kg 107.8 kg    Examination: General: She does appear comfortable HENT: Endotracheal tube in place, moist oral mucosa PULM: Coarse breath sounds throughout; decreased breath sounds at bases. No rales or wheezing appreciated. CV: tachycardic, normal S1, S2 GI: Soft, bowel sounds appreciated MSK: trace edema in all extremities  neuro: [facility] Problem list    Assessment & Plan:  Hypoxic respiratory failure secondary to ARDS secondary to COVID-19 Bacterial pneumonia -Tracheostomy aborted 9/10 -appreciate ENT consult for open tracheostomy -Continue ventilator support; wean FiO2 and PEEP as able  COVID-19 pneumonia, outside isolation window from 9/10 Healthcare associated pneumonia -Tracheal aspirate 9/10 with few staph aureus; susceptibilities pending -Day 7 of ceftriaxone today which will complete a 10 day course  -fever curve improving; no leukocytosis  -repeat blood cx pending   Critically ill requiring Sedation -fentanyl and versed infusions; wean as able  -Oxycodone -Seroquel  Hypotension -likely related to hypoxic event overnight  -wean pressors as able to keep MAP > 65   Afib with RVR -rates acceptable with dilt infusion and Metoprolol 25 mg BID -hold off on further rate control titration until off  pressor support   Hyperkalemia -1x dose of lokelma today -renal function stable -continue trending BMPs   Hypothyroidism -Continue Synthroid  History of depression -Depakote, Effexor  Type 2 diabetes -increase Levemir today; continue SSI   Best practice:  Diet:  Continue tube feeding Pain/Anxiety/Delirium protocol (if indicated): Seroquel, oxycodone VAP protocol (if indicated): yes DVT prophylaxis: lovenox GI prophylaxis: pantoprazole Glucose control: SSI/Levemir Mobility: bed rest Code Status: Full code Family Communication: Will update  Disposition: ICU   Tawanda Schall DO, DO IMTS, PGY-3 11/10/19, 8:53 am

## 2019-11-10 NOTE — Progress Notes (Signed)
Tracheostomy tentatively will be scheduled for tomorrow 9/14

## 2019-11-11 ENCOUNTER — Inpatient Hospital Stay (HOSPITAL_COMMUNITY): Payer: Medicare Other

## 2019-11-11 ENCOUNTER — Encounter (HOSPITAL_COMMUNITY): Admission: EM | Disposition: E | Payer: Self-pay | Source: Home / Self Care | Attending: Pulmonary Disease

## 2019-11-11 DIAGNOSIS — E875 Hyperkalemia: Secondary | ICD-10-CM

## 2019-11-11 DIAGNOSIS — A419 Sepsis, unspecified organism: Secondary | ICD-10-CM

## 2019-11-11 DIAGNOSIS — I4891 Unspecified atrial fibrillation: Secondary | ICD-10-CM

## 2019-11-11 DIAGNOSIS — R6521 Severe sepsis with septic shock: Secondary | ICD-10-CM

## 2019-11-11 LAB — CBC
HCT: 28.1 % — ABNORMAL LOW (ref 36.0–46.0)
Hemoglobin: 8.5 g/dL — ABNORMAL LOW (ref 12.0–15.0)
MCH: 30.4 pg (ref 26.0–34.0)
MCHC: 30.2 g/dL (ref 30.0–36.0)
MCV: 100.4 fL — ABNORMAL HIGH (ref 80.0–100.0)
Platelets: 291 10*3/uL (ref 150–400)
RBC: 2.8 MIL/uL — ABNORMAL LOW (ref 3.87–5.11)
RDW: 15.2 % (ref 11.5–15.5)
WBC: 14 10*3/uL — ABNORMAL HIGH (ref 4.0–10.5)
nRBC: 0.6 % — ABNORMAL HIGH (ref 0.0–0.2)

## 2019-11-11 LAB — POCT I-STAT 7, (LYTES, BLD GAS, ICA,H+H)
Acid-Base Excess: 4 mmol/L — ABNORMAL HIGH (ref 0.0–2.0)
Acid-Base Excess: 8 mmol/L — ABNORMAL HIGH (ref 0.0–2.0)
Bicarbonate: 28.9 mmol/L — ABNORMAL HIGH (ref 20.0–28.0)
Bicarbonate: 32.8 mmol/L — ABNORMAL HIGH (ref 20.0–28.0)
Calcium, Ion: 1.2 mmol/L (ref 1.15–1.40)
Calcium, Ion: 1.2 mmol/L (ref 1.15–1.40)
HCT: 26 % — ABNORMAL LOW (ref 36.0–46.0)
HCT: 28 % — ABNORMAL LOW (ref 36.0–46.0)
Hemoglobin: 8.8 g/dL — ABNORMAL LOW (ref 12.0–15.0)
Hemoglobin: 9.5 g/dL — ABNORMAL LOW (ref 12.0–15.0)
O2 Saturation: 83 %
O2 Saturation: 85 %
Patient temperature: 100.5
Patient temperature: 99.5
Potassium: 5.5 mmol/L — ABNORMAL HIGH (ref 3.5–5.1)
Potassium: 5.9 mmol/L — ABNORMAL HIGH (ref 3.5–5.1)
Sodium: 138 mmol/L (ref 135–145)
Sodium: 140 mmol/L (ref 135–145)
TCO2: 30 mmol/L (ref 22–32)
TCO2: 34 mmol/L — ABNORMAL HIGH (ref 22–32)
pCO2 arterial: 47 mmHg (ref 32.0–48.0)
pCO2 arterial: 51 mmHg — ABNORMAL HIGH (ref 32.0–48.0)
pH, Arterial: 7.399 (ref 7.350–7.450)
pH, Arterial: 7.42 (ref 7.350–7.450)
pO2, Arterial: 49 mmHg — ABNORMAL LOW (ref 83.0–108.0)
pO2, Arterial: 53 mmHg — ABNORMAL LOW (ref 83.0–108.0)

## 2019-11-11 LAB — BASIC METABOLIC PANEL
Anion gap: 9 (ref 5–15)
Anion gap: 9 (ref 5–15)
BUN: 31 mg/dL — ABNORMAL HIGH (ref 8–23)
BUN: 32 mg/dL — ABNORMAL HIGH (ref 8–23)
CO2: 30 mmol/L (ref 22–32)
CO2: 28 mmol/L (ref 22–32)
Calcium: 8.8 mg/dL — ABNORMAL LOW (ref 8.9–10.3)
Calcium: 8.4 mg/dL — ABNORMAL LOW (ref 8.9–10.3)
Chloride: 101 mmol/L (ref 98–111)
Chloride: 102 mmol/L (ref 98–111)
Creatinine, Ser: 0.66 mg/dL (ref 0.44–1.00)
Creatinine, Ser: 0.67 mg/dL (ref 0.44–1.00)
GFR calc Af Amer: >60 mL/min (ref >60)
GFR calc Af Amer: >60 mL/min (ref >60)
GFR calc non Af Amer: >60 mL/min (ref >60)
GFR calc non Af Amer: >60 mL/min (ref >60)
Glucose, Bld: 117 mg/dL — ABNORMAL HIGH (ref 70–99)
Glucose, Bld: 182 mg/dL — ABNORMAL HIGH (ref 70–99)
Potassium: 5.8 mmol/L — ABNORMAL HIGH (ref 3.5–5.1)
Potassium: 5.1 mmol/L (ref 3.5–5.1)
Sodium: 140 mmol/L (ref 135–145)
Sodium: 139 mmol/L (ref 135–145)

## 2019-11-11 LAB — GLUCOSE, CAPILLARY
Glucose-Capillary: 134 mg/dL — ABNORMAL HIGH (ref 70–99)
Glucose-Capillary: 143 mg/dL — ABNORMAL HIGH (ref 70–99)
Glucose-Capillary: 150 mg/dL — ABNORMAL HIGH (ref 70–99)
Glucose-Capillary: 74 mg/dL (ref 70–99)
Glucose-Capillary: 87 mg/dL (ref 70–99)
Glucose-Capillary: 94 mg/dL (ref 70–99)
Glucose-Capillary: 94 mg/dL (ref 70–99)

## 2019-11-11 SURGERY — CREATION, TRACHEOSTOMY
Anesthesia: General

## 2019-11-11 MED ORDER — SODIUM ZIRCONIUM CYCLOSILICATE 5 G PO PACK
5.0000 g | PACK | Freq: Once | ORAL | Status: AC
  Administered 2019-11-11: 5 g
  Filled 2019-11-11: qty 1

## 2019-11-11 MED ORDER — FUROSEMIDE 10 MG/ML IJ SOLN
80.0000 mg | Freq: Once | INTRAMUSCULAR | Status: AC
  Administered 2019-11-11: 80 mg via INTRAVENOUS
  Filled 2019-11-11: qty 8

## 2019-11-11 MED ORDER — PROPOFOL 10 MG/ML IV BOLUS
INTRAVENOUS | Status: AC
  Filled 2019-11-11: qty 20

## 2019-11-11 MED ORDER — DEXTROSE 5 % IV SOLN: INTRAVENOUS | Status: AC

## 2019-11-11 MED ORDER — LIDOCAINE-EPINEPHRINE 1 %-1:100000 IJ SOLN
INTRAMUSCULAR | Status: AC
  Filled 2019-11-11: qty 1

## 2019-11-11 MED ORDER — FUROSEMIDE 10 MG/ML IJ SOLN
40.0000 mg | Freq: Once | INTRAMUSCULAR | Status: AC
  Administered 2019-11-11: 40 mg via INTRAVENOUS
  Filled 2019-11-11: qty 4

## 2019-11-11 MED ORDER — SODIUM ZIRCONIUM CYCLOSILICATE 5 G PO PACK: 10.0000 g | PACK | Freq: Once | ORAL | Status: DC

## 2019-11-11 NOTE — Progress Notes (Signed)
[facility] Progress Note Patient Name: Jacqueline Lucas DOB: 01/12/1951 MRN: [id number]   Date of Service  11/23/2019  HPI/Events of Note  TF off for trach procedure in AM, BG 72.  Cr normal.   eICU Interventions  D5 at 30 ml/hr, follow POCT glucose q2 hr x 3.      Intervention Category Minor Interventions: Other:  Elmer Sow 11/14/2019, 8:51 PM

## 2019-11-11 NOTE — Progress Notes (Signed)
[facility] Progress Note Patient Name: Jacqueline Lucas DOB: 1950/05/31 MRN: [id number]   Date of Service  10/30/2019  HPI/Events of Note  Hypoxia - Sats = low 80's on 100%/P 14.  eICU Interventions  Plan: 1. Portable CXR STAT.     Intervention Category Major Interventions: Hypoxemia - evaluation and management  [name] 10/31/2019, 12:34 AM

## 2019-11-11 NOTE — Progress Notes (Signed)
[facility] Progress Note Patient Name: Jacqueline Lucas DOB: [birth year] MRN: [id number]   Date of Service  11/08/2019  HPI/Events of Note  Hypoxia - CXR - No pneumothorax. Diffuse interstitial lung disease. CVP = 14 and Creatinine = 0.62. I/O positive 5283 since admission.   eICU Interventions  Plan: 1. Increase PEEP to 14. 2. Lasix 40 mg IV X 1 now.      Intervention Category Major Interventions: Hypoxemia - evaluation and management  Ousmane Seeman [place] 11/10/2019, 1:05 AM

## 2019-11-11 NOTE — Progress Notes (Signed)
NAME:  Jacqueline Lucas, MRN:  [id number], DOB:  [birth year], LOS: 44 ADMISSION DATE:  10/02/2019, CONSULTATION DATE:  8/20 REFERRING MD:  EDP, CHIEF COMPLAINT:  Dyspnea   Brief History   69 y/o female admitted on 8/19 with severe acute respiratory failure with hypoxemia due to COVID 19 pneumonia.  Past Medical History  Asthma Obesity Hypertension Hyperlipidemia Degenerative disk disease Depression Fibromyalgia Heart murmur GERD Hypothyroidism IDA Insomnia Fe def anemia  Significant Hospital Events   8/20 Admit to [facility] 8/21 failed SBT, oxygenation worsened thorought the day 9/3 awake, following commands, weaning PSV 12/10 9/4 changing abx to zosyn due to worsening resp secretions despite ampicillin  9/10 attempted percutaneous tracheostomy 9/14 increasing PEEP to 16 Consults:  ENT  Procedures:  8/20 ETT >> 8/22 R PICC  >> 9/10 attempted trach, aborted  Significant Diagnostic Tests:  8/26 LE doppler > negative for DVT 8/27 TTE> LVEF 65-70%, Grade I DD, Mitral valve normal, RV normal size/function 9/4 CT head > no evidence of acute intracranial abnormality   Micro Data:  8/20 SARS COV 2>>positive 8/20 UC>> neg 8/20 BCx2>> neg 8/24 BCx2 >> neg 8/24 respiratory culture >> Klebsiella 8/30 BCx 2 >> 9/1 trach asp >>klebsiella 9/4 trach asp >> Staph aureus  Antimicrobials:  8/20 Remdesivir >8/24 8/20 baricitinib >> 9/2 8/20 solumedrol > taper started 8/27 >> 8/24 cefepime >>8/31 9/4 cefepime >> 9/7 9/7 Ceftriaxone > 9/13 zyvox>>   Interim history/subjective:   Hypoxia overnight requiring PEEP increase to 16  ENT plans to defer tracheostomy in this setting  Objective   Blood pressure 134/77, pulse 88, temperature 100.2 F (37.9 C), temperature source Oral, resp. rate 16, height 5\' 4"  (1.626 m), weight 110.2 kg, SpO2 (!) 87 %. CVP:  [12 mmHg-17 mmHg] 17 mmHg  Vent Mode: PRVC FiO2 (%):  [100 %] 100 % Set Rate:  [15 bmp-20 bmp] 20 bmp Vt Set:  [440 mL]  440 mL PEEP:  [10 cmH20-14 cmH20] 14 cmH20 Pressure Support:  [12 cmH20] 12 cmH20 Plateau Pressure:  [18 cmH20-34 cmH20] 34 cmH20   Intake/Output Summary (Last 24 hours) at 11/02/2019 0755 Last data filed at 11/27/2019 0636 Gross per 24 hour  Intake 3772.92 ml  Output 1005 ml  Net 2767.92 ml   Filed Weights   11/09/19 0500 11/10/19 0500 11/13/2019 0500  Weight: 106.4 kg 107.8 kg 110.2 kg    Examination: General: Elderly critically ill appearing F, sedated intubated NAD HENT: NCAT ETT secure. 2x sutures intact above sternal notch PULM: Coarse bilateral sounds, mechanically ventilated, Symmetrical chest expansion  YK:DXIPJ, regular. s1s2 no rgm  GI: soft round ndnt  MSK: Dependent non-pitting BUE edema, trace BLE edema  neuro: Sedated   Resolved Hospital Problem list     Assessment & Plan:  Hypoxic respiratory failure secondary to ARDS secondary to COVID-19 MRSA PNA  -Tracheostomy aborted 9/10 -increasing PEEP 9/14 due to desat overnight without clear etiology P -appreciate ENT consult for open tracheostomy-- unable to pursue 9/14 due to increased PEEP  -Continue ventilator support; wean FiO2 and PEEP as able -Tracheal aspirate 9/10 with few staph aureus; susceptibilities pending - Continue zyvox  -VAP, PAD   Acute encephalopathy -CNS depressing medications for ventilator sedation, critical illness -wean fent, versed as able  Shock  -in setting of PNA vs alternative etiology -wean pressors as able to keep MAP > 65   Afib with RVR -- rate controlled 9/14 -dc dilt gtt given pressors  Hyperkalemia -5.8 9/14 -f/i BMP after overnight lasix. Pending  result, possible repeat lokelma  -renal function stable  Anemia -s/p 1 PRBC 9/13  -Hgb goal > 8   Hypothyroidism -Continue Synthroid  History of depression -Depakote, Effexor  Type 2 diabetes -levemir, SSI   Best practice:  Diet: Continue tube feeding Pain/Anxiety/Delirium protocol (if indicated): fentanyl,  versed  VAP protocol (if indicated): yes DVT prophylaxis: lovenox GI prophylaxis: pantoprazole Glucose control: SSI/Levemir Mobility: bed rest Code Status: Full code Family Communication:  Pending 9/14 Disposition: ICU   CRITICAL CARE Performed by: Cristal Generous   Total critical care time: 40 minutes  Critical care time was exclusive of separately billable procedures and treating other patients. Critical care was necessary to treat or prevent imminent or life-threatening deterioration.  Critical care was time spent personally by me on the following activities: development of treatment plan with patient and/or surrogate as well as nursing, discussions with consultants, evaluation of patient's response to treatment, examination of patient, obtaining history from patient or surrogate, ordering and performing treatments and interventions, ordering and review of laboratory studies, ordering and review of radiographic studies, pulse oximetry and re-evaluation of patient's condition.   Eliseo Gum MSN, AGACNP-BC [place] [phone] If no answer, [phone] 11/15/2019, 7:55 AM

## 2019-11-11 NOTE — Progress Notes (Signed)
[place] Progress Note Patient Name: Jacqueline Lucas DOB: June 04, 1950 MRN: [id number]   Date of Service  11/18/2019  HPI/Events of Note  Hypoxia - Sat remains = 84% on 100%/P 14. Has made minimal urine (150 mL) with 40 mg Lasix dose.   eICU Interventions  Plan: 1. Increase PEEP to 16.  2. Lasix 80 mg IV X 1.      Intervention Category Major Interventions: Hypoxemia - evaluation and management  [name] 11/16/2019, 4:33 AM

## 2019-11-11 NOTE — Progress Notes (Signed)
PCCM Family Communication  Attempted to reach patient's son for daily updates and to answer potential questions. Unable to reach, directed to voicemail.   Eliseo Gum MSN, AGACNP-BC [place] [phone] If no answer, [phone] 11/24/2019, 2:23 PM

## 2019-11-12 ENCOUNTER — Inpatient Hospital Stay (HOSPITAL_COMMUNITY): Payer: Medicare Other

## 2019-11-12 LAB — CBC
HCT: 25 % — ABNORMAL LOW (ref 36.0–46.0)
Hemoglobin: 7.7 g/dL — ABNORMAL LOW (ref 12.0–15.0)
MCH: 30.4 pg (ref 26.0–34.0)
MCHC: 30.8 g/dL (ref 30.0–36.0)
MCV: 98.8 fL (ref 80.0–100.0)
Platelets: 271 10*3/uL (ref 150–400)
RBC: 2.53 MIL/uL — ABNORMAL LOW (ref 3.87–5.11)
RDW: 14.9 % (ref 11.5–15.5)
WBC: 12.5 10*3/uL — ABNORMAL HIGH (ref 4.0–10.5)
nRBC: 0.6 % — ABNORMAL HIGH (ref 0.0–0.2)

## 2019-11-12 LAB — POCT I-STAT 7, (LYTES, BLD GAS, ICA,H+H)
Acid-Base Excess: 11 mmol/L — ABNORMAL HIGH (ref 0.0–2.0)
Bicarbonate: 35.9 mmol/L — ABNORMAL HIGH (ref 20.0–28.0)
Calcium, Ion: 1.22 mmol/L (ref 1.15–1.40)
HCT: 23 % — ABNORMAL LOW (ref 36.0–46.0)
Hemoglobin: 7.8 g/dL — ABNORMAL LOW (ref 12.0–15.0)
O2 Saturation: 99 %
Patient temperature: 100
Potassium: 5.3 mmol/L — ABNORMAL HIGH (ref 3.5–5.1)
Sodium: 139 mmol/L (ref 135–145)
TCO2: 37 mmol/L — ABNORMAL HIGH (ref 22–32)
pCO2 arterial: 53.6 mmHg — ABNORMAL HIGH (ref 32.0–48.0)
pH, Arterial: 7.437 (ref 7.350–7.450)
pO2, Arterial: 122 mmHg — ABNORMAL HIGH (ref 83.0–108.0)

## 2019-11-12 LAB — GLUCOSE, CAPILLARY
Glucose-Capillary: 92 mg/dL (ref 70–99)
Glucose-Capillary: 86 mg/dL (ref 70–99)
Glucose-Capillary: 93 mg/dL (ref 70–99)
Glucose-Capillary: 166 mg/dL — ABNORMAL HIGH (ref 70–99)
Glucose-Capillary: 111 mg/dL — ABNORMAL HIGH (ref 70–99)
Glucose-Capillary: 105 mg/dL — ABNORMAL HIGH (ref 70–99)

## 2019-11-12 LAB — PREPARE RBC (CROSSMATCH)

## 2019-11-12 LAB — BASIC METABOLIC PANEL
Anion gap: 9 (ref 5–15)
BUN: 30 mg/dL — ABNORMAL HIGH (ref 8–23)
CO2: 30 mmol/L (ref 22–32)
Calcium: 8.8 mg/dL — ABNORMAL LOW (ref 8.9–10.3)
Chloride: 100 mmol/L (ref 98–111)
Creatinine, Ser: 0.57 mg/dL (ref 0.44–1.00)
GFR calc Af Amer: >60 mL/min (ref >60)
GFR calc non Af Amer: >60 mL/min (ref >60)
Glucose, Bld: 95 mg/dL (ref 70–99)
Potassium: 5 mmol/L (ref 3.5–5.1)
Sodium: 139 mmol/L (ref 135–145)

## 2019-11-12 MED ORDER — DEXTROSE 5 % IV SOLN: INTRAVENOUS | Status: DC

## 2019-11-12 MED ORDER — MORPHINE BOLUS VIA INFUSION
5.0000 mg | INTRAVENOUS | Status: DC | PRN
  Filled 2019-11-12: qty 5

## 2019-11-12 MED ORDER — MORPHINE SULFATE (PF) 2 MG/ML IV SOLN: 2.0000 mg | INTRAVENOUS | Status: DC | PRN

## 2019-11-12 MED ORDER — MIDAZOLAM HCL 2 MG/2ML IJ SOLN
1.0000 mg | INTRAMUSCULAR | Status: DC | PRN
  Administered 2019-11-12: 1 mg via INTRAVENOUS
  Filled 2019-11-12: qty 2

## 2019-11-12 MED ORDER — MIDAZOLAM 50MG/50ML (1MG/ML) PREMIX INFUSION: 0.0000 mg/h | INTRAVENOUS | Status: DC

## 2019-11-12 MED ORDER — IOHEXOL 350 MG/ML SOLN
75.0000 mL | Freq: Once | INTRAVENOUS | Status: AC | PRN
  Administered 2019-11-12: 75 mL via INTRAVENOUS

## 2019-11-12 MED ORDER — GLYCOPYRROLATE 1 MG PO TABS: 1.0000 mg | ORAL_TABLET | ORAL | Status: DC | PRN

## 2019-11-12 MED ORDER — ACETAMINOPHEN 325 MG PO TABS: 650.0000 mg | ORAL_TABLET | Freq: Four times a day (QID) | ORAL | Status: DC | PRN

## 2019-11-12 MED ORDER — GLYCOPYRROLATE 0.2 MG/ML IJ SOLN: 0.2000 mg | INTRAMUSCULAR | Status: DC | PRN

## 2019-11-12 MED ORDER — METOPROLOL TARTRATE 5 MG/5ML IV SOLN
INTRAVENOUS | Status: AC
  Administered 2019-11-12: 5 mg via INTRAVENOUS
  Filled 2019-11-12: qty 5

## 2019-11-12 MED ORDER — ACETAMINOPHEN 650 MG RE SUPP: 650.0000 mg | Freq: Four times a day (QID) | RECTAL | Status: DC | PRN

## 2019-11-12 MED ORDER — METOPROLOL TARTRATE 5 MG/5ML IV SOLN: 5.0000 mg | Freq: Once | INTRAVENOUS | Status: AC

## 2019-11-12 MED ORDER — SODIUM CHLORIDE 0.9% IV SOLUTION: Freq: Once | INTRAVENOUS | Status: DC

## 2019-11-12 MED ORDER — MORPHINE 100MG IN NS 100ML (1MG/ML) PREMIX INFUSION: 0.0000 mg/h | INTRAVENOUS | Status: DC

## 2019-11-12 MED ORDER — DILTIAZEM 12 MG/ML ORAL SUSPENSION
90.0000 mg | Freq: Four times a day (QID) | ORAL | Status: DC
  Administered 2019-11-12 (×2): 90 mg
  Filled 2019-11-12 (×4): qty 9

## 2019-11-12 MED ORDER — POLYVINYL ALCOHOL 1.4 % OP SOLN
1.0000 [drp] | Freq: Four times a day (QID) | OPHTHALMIC | Status: DC | PRN
  Filled 2019-11-12: qty 15

## 2019-11-12 MED ORDER — DIPHENHYDRAMINE HCL 50 MG/ML IJ SOLN: 25.0000 mg | INTRAMUSCULAR | Status: DC | PRN

## 2019-11-12 MED ORDER — HEPARIN (PORCINE) 25000 UT/250ML-% IV SOLN
1000.0000 [IU]/h | INTRAVENOUS | Status: DC
  Administered 2019-11-12: 1000 [IU]/h via INTRAVENOUS
  Filled 2019-11-12: qty 250

## 2019-11-12 MED ORDER — MIDAZOLAM BOLUS VIA INFUSION (WITHDRAWAL LIFE SUSTAINING TX)
2.0000 mg | INTRAVENOUS | Status: DC | PRN
  Filled 2019-11-12: qty 2

## 2019-11-13 LAB — BPAM RBC
Blood Product Expiration Date: 202110142359
Blood Product Expiration Date: 202110152359
ISSUE DATE / TIME: 202109131525
ISSUE DATE / TIME: 202109151242
Unit Type and Rh: 5100
Unit Type and Rh: 5100

## 2019-11-13 LAB — TYPE AND SCREEN
ABO/RH(D): O POS
Antibody Screen: NEGATIVE
Unit division: 0
Unit division: 0

## 2019-11-14 LAB — CULTURE, BLOOD (ROUTINE X 2)
Culture: NO GROWTH
Culture: NO GROWTH

## 2019-11-18 ENCOUNTER — Ambulatory Visit: Payer: Medicare Other | Admitting: Urology

## 2019-11-28 NOTE — Progress Notes (Signed)
Nutrition Follow-up  DOCUMENTATION CODES:   Obesity unspecified  INTERVENTION:   Continue Tube Feeding via postpyloric Cortrak: Vital 1.5 at 55 ml/hr Pro-Source TF 45 mL QID Provides 2140 kcals, 133 g of protein and 1003 mL of free water Meets 100% estimated calorie and protein needs:    NUTRITION DIAGNOSIS:   Inadequate oral intake related to inability to eat as evidenced by NPO status.  Ongoing    GOAL:   Patient will meet greater than or equal to 90% of their needs  Met with TF  MONITOR:   Vent status, Labs, Weight trends, TF tolerance, I & O's  REASON FOR ASSESSMENT:   Consult Assessment of nutrition requirement/status, Enteral/tube feeding initiation and management  ASSESSMENT:   69 year old female with PMHx of HTN, asthma, HLD, anxiety, arthritis, depression, GERD, migraines, chronic nausea, fibromyalgia, IBS, hypothyroidism, osteoporosis, iron deficiency anemia who was diagnosed with COVID-19 on 8/19 and now admitted with respiratory failure.  8/20 Intubated 8/25 Cortrak placed (gastric) 9/01 Cortrak tube advanced, not post pyloric? (no xray obtained) 9/03 Cortrak tube advanced postpyloric  TF is currently on hold for possible trach placement by ENT today. Lurline Idol was postponed yesterday due to worsening respiratory status.  Previously tolerated Vital 1.5 at 55 ml/hr with Pro-Source TF 45 mL QID via Cortrak.  Patient remains intubated on ventilator support. MV: 8.5 L/min Temp (24hrs), Avg:99.5 F (37.5 C), Min:97 F (36.1 C), Max:100.6 F (38.1 C)   Weight up to 116 kg today. Admit weight 94.3 kg  I/O +5.6 L since admission UOP 1070 ml x 24 hours Stool 225 ml x 24 hours  Labs reviewed. CBG: [id number] Medications reviewed and include colace, ss novolog, levemir, miralax.   Diet Order:   Diet Order            Diet NPO time specified Except for: Other (See Comments)  Diet effective now                 EDUCATION NEEDS:   No education  needs have been identified at this time  Skin:  Skin Assessment: Reviewed RN Assessment  Last BM:  9/14  Height:   Ht Readings from Last 1 Encounters:  10/22/19 _0  (1.626 m)    Weight:   Wt Readings from Last 1 Encounters:  11/26/19 116 kg    Ideal Body Weight:   (IBW: 55 kg, Adjusted BW: 70 kg)  BMI:  Body mass index is 43.9 kg/m.  Estimated Nutritional Needs:   Kcal:  1607-3710 kcals  Protein:  120-140 g  Fluid:  >/= 2 L/day   Lucas Mallow, RD, LDN, CNSC Please refer to Amion for contact information.

## 2019-11-28 NOTE — Progress Notes (Signed)
ANTICOAGULATION CONSULT NOTE - Initial Consult  Pharmacy Consult for IV Heparin Indication: atrial fibrillation  Allergies  Allergen Reactions  . Morphine Nausea And Vomiting and Other (See Comments)    Ok with anti-nausea medication  . Lyrica [Pregabalin] Other (See Comments)    Joint swelling  . Metformin And Related Diarrhea  . Adhesive [Tape] Rash    Please use paper tape  . Diazepam Nausea And Vomiting and Other (See Comments)    Can take with anti-nausea medication  . Latex Rash and Other (See Comments)    "Band-Aid glue"  . Rash Away [Petrolatum-Zinc Oxide] Swelling and Rash  . Salicylates Other (See Comments)    Headaches    Patient Measurements: Height: 5\' 4"  (162.6 cm) Weight: 116 kg (255 lb 11.7 oz) IBW/kg (Calculated) : 54.7 Heparin Dosing Weight: 83 kg  Vital Signs: Temp: 99.1 F (37.3 C) (09/15 1250) Temp Source: Oral (09/15 1250) BP: 133/48 (09/15 1136) Pulse Rate: 78 (09/15 1215)  Labs: Recent Labs    11/10/19 0334 11/10/19 1134 11/15/2019 0526 11/06/2019 0526 10/29/2019 1100 2019-12-02 0328 December 02, 2019 0412  HGB 7.2*   < > 8.5*   < >  --  7.8* 7.7*  HCT 24.4*   < > 28.1*  --   --  23.0* 25.0*  PLT 240  --  291  --   --   --  271  CREATININE 0.62  --  0.66  --  0.67  --  0.57   < > = values in this interval not displayed.    Estimated Creatinine Clearance: 81.8 mL/min (by C-G formula based on SCr of 0.57 mg/dL).   Medical History: Past Medical History:  Diagnosis Date  . Allergy   . Allergy to adhesive tape    Allergy to paper tape as well  . Anemia   . Anginal pain ([place])   . Anxiety   . Arthritis   . Asthma   . Bronchitis   . Cataract   . Chronic nausea   . Claustrophobia   . DDD (degenerative disc disease), lumbar   . Depression   . Dry eyes    uses Restasis  . Fibromyalgia   . GERD (gastroesophageal reflux disease)   . H/O bladder infections   . Heart murmur   . Hematuria   . Hyperlipidemia   . Hypertension   . Hypothyroidism    . IBS (irritable bowel syndrome)   . IDA (iron deficiency anemia) 05/13/2019  . Insomnia   . Lumbar neuritis   . Migraines   . Obesity   . Osteoporosis   . Palpitation   . RLS (restless legs syndrome)   . Shoulder fracture, right   . Sicca ([place])   . Tachycardia   . Thyroid disease   . Wears dentures    partial lower    Assessment: 69 year old female with COVID ARDS who has been on Lovenox therapy for DVT prophylaxis now to transition to IV Heparin for atrial fibrillation.   Last Lovenox 50mg  given on 9/14 at 2200PM.  Hgb 7.7 - slow trend down. No bleeding present.  Platelets are within normal limits.  Patient not able to follow commands on wake up assessment.  Plan for Lurline Idol has been placed on hold due to high vent requirements. Head CT later today if able - currently delayed. Discussed with MD and wishes to proceed with IV Heparin therapy at this time.   Goal of Therapy:  Heparin level 0.3-0.7 units/ml Monitor platelets by  anticoagulation protocol: Yes   Plan:  Heparin (no bolus) at 1000 units/hr.  Heparin level in 8 hours.  Monitor Head CT results.  Daily Heparin level and CBC.   Sloan Leiter, PharmD, BCPS, BCCCP Clinical Pharmacist Please refer to [facility] for [place] numbers 11/18/2019,1:00 PM

## 2019-11-28 NOTE — Progress Notes (Signed)
E-link paged about patients HR increasing up to the 140s. No orders obtained at this time, will continue to monitor.

## 2019-11-28 NOTE — Progress Notes (Addendum)
I updated patient's son about findings on CT scan of the chest and CT head  CT had shown evidence of CVA-likely contributing to encephalopathy She is being weaned off sedating medications  Ventilator requirement is still very high at 100% on a PEEP of 16 with saturations about 88 to 90% Extensive infiltrative process likely contributing to inability to ventilate well  Patient was started on full dose anticoagulation today Unfortunately still very sick that she is not able to have a tracheostomy performed  Consult palliative care-done  Continue other lines of care

## 2019-11-28 NOTE — Procedures (Signed)
Extubation Procedure Note  Patient Details:   Name: Jacqueline Lucas DOB: September 15, 1950 MRN: [id number]   Airway Documentation:  Airway 7.5 mm (Active)  Secured at (cm) 23 cm 12-09-2019 1943  Measured From Lips 2019/12/09 1943  Secured Location Right 12/09/2019 1943  Secured By [employer] 2019-12-09 1943  Tube Holder Repositioned Yes December 09, 2019 1943  Cuff Pressure (cm H2O) 26 cm H2O 09-Dec-2019 1943  Site Condition Dry 2019/12/09 1943   Vent end date: 12-09-2019 Vent end time: 2020   Evaluation  O2 sats: currently acceptable Complications: No apparent complications Patient did tolerate procedure well. Bilateral Breath Sounds: Diminished  Patient terminally extubated.  Yes  Ulice Dash Dec 09, 2019, 8:22 PM

## 2019-11-28 NOTE — Progress Notes (Signed)
Transitioned pt to comfort care per patient's family and Dr. Ander Slade. Continued pt on fentanyl gtt as pt visibly comfortable post extubation giving prn versed as needed. Provided emotional support to family. TOD 06/10/43 pronounced by this RN and Loleta Dicker, RN.

## 2019-11-28 NOTE — Progress Notes (Signed)
NAME:  Jacqueline Lucas, MRN:  [id number], DOB:  [birth year], LOS: 28 ADMISSION DATE:  09/30/2019, CONSULTATION DATE:  8/20 REFERRING MD:  EDP, CHIEF COMPLAINT:  Dyspnea   Brief History   69 y/o female admitted on 8/19 with severe acute respiratory failure with hypoxemia due to COVID 19 pneumonia.  Past Medical History  Asthma Obesity Hypertension Hyperlipidemia Degenerative disk disease Depression Fibromyalgia Heart murmur GERD Hypothyroidism IDA Insomnia Fe def anemia  Significant Hospital Events   8/20 Admit to [facility] 8/21 failed SBT, oxygenation worsened thorought the day 9/3 awake, following commands, weaning PSV 12/10 9/4 changing abx to zosyn due to worsening resp secretions despite ampicillin  9/10 attempted percutaneous tracheostomy 9/14 increasing PEEP to 16 Consults:  ENT  Procedures:  8/20 ETT >> 8/22 R PICC  >> 9/10 attempted trach, aborted  Significant Diagnostic Tests:  8/26 LE doppler > negative for DVT 8/27 TTE> LVEF 65-70%, Grade I DD, Mitral valve normal, RV normal size/function 9/4 CT head > no evidence of acute intracranial abnormality   Micro Data:  8/20 SARS COV 2>>positive 8/20 UC>> neg 8/20 BCx2>> neg 8/24 BCx2 >> neg 8/24 respiratory culture >> Klebsiella 8/30 BCx 2 >> 9/1 trach asp >>klebsiella 9/4 trach asp >> Staph aureus  Antimicrobials:  8/20 Remdesivir >8/24 8/20 baricitinib >> 9/2 8/20 solumedrol > taper started 8/27 >> 8/24 cefepime >>8/31 9/4 cefepime >> 9/7 9/7 Ceftriaxone >9/13 9/13 zyvox>>   Interim history/subjective:   Heart rates sustained in the 130s overnight. Deeply sedated this morning. Does not arouse to verbal or physical stimuli.   Objective   Blood pressure (!) 130/52, pulse (!) 130, temperature (!) 97 F (36.1 C), temperature source Axillary, resp. rate 20, height 5\' 4"  (1.626 m), weight 116 kg, SpO2 90 %. CVP:  [0 mmHg-16 mmHg] 16 mmHg  Vent Mode: PRVC FiO2 (%):  [80 %-100 %] 80 % Set Rate:  [20  bmp] 20 bmp Vt Set:  [440 mL] 440 mL PEEP:  [16 cmH20] 16 cmH20 Plateau Pressure:  [35 cmH20] 35 cmH20   Intake/Output Summary (Last 24 hours) at 12-11-19 0817 Last data filed at 12-11-2019 2025 Gross per 24 hour  Intake 1690.07 ml  Output 940 ml  Net 750.07 ml   Filed Weights   11/10/19 0500 11/11/2019 0500 2019-12-11 0413  Weight: 107.8 kg 110.2 kg 116 kg    Examination: General: Elderly critically ill appearing F, sedated intubated NAD HENT: NCAT ETT secure. 2x sutures intact above sternal notch PULM: Coarse bilateral sounds, mechanically ventilated, Symmetrical chest expansion  KY:HCWCB, regular. s1s2 no rgm  GI: soft round ndnt  MSK: Dependent non-pitting BUE edema, trace BLE edema  neuro: Sedated   Resolved Hospital Problem list     Assessment & Plan:  Hypoxic respiratory failure secondary to ARDS secondary to COVID-19 MRSA PNA  -Tracheostomy aborted 9/10 -remains on FiO2 of 80%; PEEP of 16; wean as able  -appreciate ENT consult for open tracheostomy-- deferred for now due to worsening respiratory status  -continue Zyvox for concurrent MRSA pneumonia  -VAP, PAD   Acute encephalopathy -CNS depressing medications for ventilator sedation, critical illness -weaning fent and versed -if not improving, will pursue CT head to evaluate for new intracranial process contributing to presentation   Septic Shock  -in setting of PNA  -MAPs improved; currently off pressors   Afib with RVR  -rates poorly controlled since stopping dilt gtt -start dilt 90 q 6 hours; continue Metop 50 BID -hold off on full dose anticoagulation until  decision is made on trach    Anemia -s/p 1 PRBC 9/13  -hgb this morning 7.7; will give another unit today to maintain Hgb goal of > 8; this may also help with rate control    Hypothyroidism -Continue Synthroid  History of depression -Depakote, Effexor  Type 2 diabetes -levemir, SSI   Best practice:  Diet: Continue tube  feeding Pain/Anxiety/Delirium protocol (if indicated): fentanyl, versed  VAP protocol (if indicated): yes DVT prophylaxis: lovenox GI prophylaxis: pantoprazole Glucose control: SSI/Levemir Mobility: bed rest Code Status: Full code Family Communication: will attempt to reach son for update 9/15 Disposition: ICU   Modena Nunnery DO, DO IMTS, PGY-3 19-Nov-2019, 8:55 am

## 2019-11-28 NOTE — Progress Notes (Signed)
Wasted 275mL fentanyl gtt with [place], RN.

## 2019-11-28 NOTE — Death Summary Note (Signed)
DEATH SUMMARY   Patient Details  Name: Jacqueline Lucas MRN: [id number] DOB: 09-09-1950  Admission/Discharge Information   Admit Date:  2019-10-26  Date of Death: Date of Death: 2019/11/21  Time of Death: Time of Death: 29-May-2043  Length of Stay: 06-01-22  Referring Physician: [name], MD   Reason(s) for Hospitalization  Patient presented to the emergency department with worsening shortness of breath, diagnosed with Covid pneumonia Intubated for respiratory failure  Diagnoses  Preliminary cause of death: COVID-19 pneumonia Secondary Diagnoses (including complications and co-morbidities):  Active Problems:   COVID-19   Acute respiratory failure with hypoxia (HCC) Cerebrovascular accident Klebsiella pneumonia Coronary artery disease Asthma Obesity Hypothyroidism Hypertension  Supraventricular tachycardia  Fibromyalgia Generalized anxiety disorder Depression Diabetes [place]  Iron deficiency anemia Lumbar stenosis  Brief Hospital Course (including significant findings, care, treatment, and services provided and events leading to death)  Jacqueline Lucas is a 69 y.o. year old female who was diagnosed with Covid infection 8/19.  Presented to the hospital worsening shortness of breath diagnosed with acute respiratory failure secondary to COVID-19 pneumonia. Intubated 10-26-2022. Progressively worsening oxygenation, being treated with remdesivir, baricitinib and Solu-Medrol.  Had to be paralyzed due to ventilator dyssynchrony.  Concern for bacterial pneumonia. With worsening respiratory status-patient proned, escalation of antibiotic therapy for Klebsiella pneumonia. Did have episode of bradycardia/asystole requiring atropine treatment On 9/2-some improvement in oxygenation, still requiring heavy sedation for mechanical ventilation 9/3 appears to be weaning but requiring more sedation. 9/4 confusion, some agitation on the vent and posturing of the upper extremities.  CT scan of the  head was unremarkable.  Still have thick secretions making extubation difficult.  Worsening secretions-antibiotics was changed Continue to wean with increasing secretions, was plan for tracheostomy Tracheostomy attempted on 9/10-aborted secondary to inability to cannulate the airway and complicated by bleeding. 9/11 sedation had to be increased, not following commands.  New fever Tracheal aspirate showing rare gram-positive cocci with no specific identification yet Increased doses of sedation for vent tolerance.  Worsening oxygen requirement.  ENT consulted for tracheostomy tube placement.  Required transfusion for anemia.  Zyvox added for MRSA tracheal aspirate.  Blood cultures negative. Worsening oxygen requirement Increased PEEP requirement and increased oxygen requirement Tracheostomy placement was canceled. Decision was made to wean patient off sedating medications as optimally as possible, patient noted not to be very interactive following decreasing sedation.  CT scan of the head was obtained revealing evidence of CVA. With worsening status, family contacted and updated with status Following family decision decision was made to make patient comfort measures only. Compassionate withdrawal of aggressive care was put in place Patient succumbed to her illness on November 21, 2019 at May 29, 2043 hrs.      Pertinent Labs and Studies  Significant Diagnostic Studies DG Chest 1 View  Result Date: 11/07/2019 CLINICAL DATA:  Pneumonia EXAM: CHEST  1 VIEW COMPARISON:  11/06/2019 chest radiograph and prior. FINDINGS: Stable support tubes and lines. Multifocal bilateral pulmonary opacities are unchanged. No pneumothorax or pleural effusion. Cardiomediastinal silhouette is partially obscured. No acute osseous abnormalities IMPRESSION: Multifocal pulmonary opacities are unchanged. Stable support tubes and lines. Electronically Signed   By: Primitivo Gauze M.D.   On: 11/07/2019 08:38   DG Abd 1 View  Result  Date: 10/29/2019 CLINICAL DATA:  Feeding tube placement. EXAM: ABDOMEN - 1 VIEW COMPARISON:  October 18, 2019. FINDINGS: The bowel gas pattern is normal. Distal tip of feeding tube is seen in the expected position of distal stomach. No radio-opaque calculi or other  significant radiographic abnormality are seen. IMPRESSION: Distal tip of feeding tube seen in expected position of distal stomach. Electronically Signed   By: Marijo Conception M.D.   On: 10/29/2019 09:48   DG Abdomen 1 View  Result Date: 09/30/2019 CLINICAL DATA:  OG tube EXAM: ABDOMEN - 1 VIEW COMPARISON:  07/16/2015 FINDINGS: Esophageal tube tip in the left upper quadrant overlying proximal stomach. Possible small kink in the distal tube. Basilar airspace disease IMPRESSION: Esophageal tube tip in the left upper quadrant overlying the proximal stomach, possible small kink in the distal tube. Electronically Signed   By: Donavan Foil M.D.   On: 09/28/2019 21:26   CT HEAD WO CONTRAST  Result Date: 12/05/19 CLINICAL DATA:  69 year old female with altered mental status. Positive COVID-19. EXAM: CT HEAD WITHOUT CONTRAST TECHNIQUE: Contiguous axial images were obtained from the base of the skull through the vertex without intravenous contrast. COMPARISON:  Brain MRI 06/23/2016.  Head CT 11/01/2019. FINDINGS: Brain: Stable cerebral volume. No intracranial mass effect or midline shift. No ventriculomegaly. Partially empty sella again noted. However, there is a new area of hypodensity in the right parietal lobe most resembling cytotoxic edema (sagittal image 20) involving cortex and tracking in the posterior white matter to the posterior right corona radiata. No associated hemorrhage. There is a 2nd area of new cortical edema in the anterior left superior frontal gyrus (coronal image 23). And also evidence of new edema in the left occipital pole. No hemorrhage identified at these sites either. Elsewhere gray-white matter differentiation appears stable  including in the deep gray nuclei, brainstem, cerebellum. Vascular: Calcified atherosclerosis at the skull base. No suspicious intracranial vascular hyperdensity. Skull: No acute osseous abnormality identified. Sinuses/Orbits: Right nasoenteric tube in place. Intubated. Fluid in the pharynx. Posterior ethmoid and sphenoid sinuses are completely opacified along with the bilateral tympanic cavities and mastoids. Other: No acute orbit or scalp soft tissue finding. IMPRESSION: 1. Evidence of multifocal acute to subacute cerebral infarcts, new since 11/01/2019. Small areas of the right parietal, left anterior frontal, and left occipital lobes affected. This the pattern is suspicious for sequelae of cardiac embolic event. 2. No associated hemorrhage or mass effect. 3. No other acute intracranial abnormality identified. Electronically Signed   By: Genevie Ann M.D.   On: Dec 05, 2019 15:58   CT HEAD WO CONTRAST  Result Date: 11/01/2019 CLINICAL DATA:  Mental status change.  COVID-19 positive. EXAM: CT HEAD WITHOUT CONTRAST TECHNIQUE: Contiguous axial images were obtained from the base of the skull through the vertex without intravenous contrast. COMPARISON:  12/11/2018 FINDINGS: Brain: There is no evidence of an acute infarct, intracranial hemorrhage, mass, midline shift, or extra-axial fluid collection. Patchy hypodensities in the cerebral white matter bilaterally are similar to the prior study and nonspecific but compatible with moderate to severe chronic small vessel ischemic disease. The ventricles and sulci are within normal limits for age. Vascular: Calcified atherosclerosis at the skull base. No hyperdense vessel. Skull: No fracture or suspicious osseous lesion. Sinuses/Orbits: Complete right and near complete left sphenoid sinus opacification. Moderate bilateral posterior ethmoid air cell opacification. Large bilateral mastoid and middle ear effusions in the setting of intubation. Bilateral cataract extraction. Other:  None. IMPRESSION: 1. No evidence of acute intracranial abnormality. 2. Moderate to severe chronic small vessel ischemic disease. 3. Large bilateral mastoid and middle ear effusions. Electronically Signed   By: Logan Bores M.D.   On: 11/01/2019 18:04   CT ANGIO CHEST PE W OR WO CONTRAST  Result Date: 12-05-19  CLINICAL DATA:  69 year old female with altered mental status. Positive COVID-19. EXAM: CT ANGIOGRAPHY CHEST WITH CONTRAST TECHNIQUE: Multidetector CT imaging of the chest was performed using the standard protocol during bolus administration of intravenous contrast. Multiplanar CT image reconstructions and MIPs were obtained to evaluate the vascular anatomy. CONTRAST:  35mL OMNIPAQUE IOHEXOL 350 MG/ML SOLN COMPARISON:  Portable chest 0431 hours today. CT Abdomen and Pelvis 03/26/2019. FINDINGS: Cardiovascular: Adequate contrast bolus timing in the pulmonary arterial tree. Mild respiratory motion. No pulmonary artery embolus identified. Cardiac size at the upper limits of normal. No pericardial effusion. Negative visible aorta aside from some atherosclerosis. There is a right upper extremity approach PICC line in place terminating at the SVC near the level of the carina. Mediastinum/Nodes: Mediastinal lymphadenopathy, including a right paratracheal and precarinal lymph nodes up to 19 mm short axis. Indistinct increased posterior mediastinal soft tissue at the upper thoracic levels, although no underlying upper thoracic vertebral abnormality is evident. See series 6, image 51. No hilar lymphadenopathy. Enteric tube courses in the esophagus to the abdomen. Lungs/Pleura: Endotracheal tube tip terminates between the clavicles and carina. Major airways are patent. Widespread bilateral airspace opacity ranging from ground-glass to solid consolidation. Dependent and lower lungs worst affected. Appearance resembling bilateral ARDS. Only the left lung apex is partially spared. Trace superimposed pleural fluid.  Upper Abdomen: Enteric tube present through the visible mid stomach. No upper abdominal free air or free fluid. Negative visible liver, spleen, adrenal glands. Musculoskeletal: No acute or suspicious osseous lesion identified. Nonspecific upper thoracic prevertebral/posterior mediastinal soft tissue thickening (series 5, image 16). Other thoracic paraspinal soft tissues appear within normal limits. Review of the MIP images confirms the above findings. IMPRESSION: 1. No acute pulmonary embolus identified. 2. Widespread bilateral airspace opacity compatible with severe COVID-19 pneumonia, ARDS. Trace pleural fluid. 3. Superior mediastinal lymphadenopathy, favor reactive. However, there is also an area of more nonspecific upper thoracic prevertebral/posterior mediastinal soft tissue thickening - which is of unclear etiology. No abnormality identified in the upper thoracic spine by CT. 4. Aortic Atherosclerosis (ICD10-I70.0). Electronically Signed   By: Genevie Ann M.D.   On: 2019/11/15 16:05   DG CHEST PORT 1 VIEW  Result Date: 15-Nov-2019 CLINICAL DATA:  Check endotracheal tube placement EXAM: PORTABLE CHEST 1 VIEW COMPARISON:  11/01/2019 FINDINGS: Cardiac shadow is stable. Endotracheal tube and feeding catheter are again noted and stable. Right-sided PICC line is noted in satisfactory position. Diffuse airspace opacity is again identified and stable. No new focal abnormality is noted. IMPRESSION: No change from the prior exam. Electronically Signed   By: Inez Catalina M.D.   On: 11-15-19 06:51   DG CHEST PORT 1 VIEW  Result Date: 11/08/2019 CLINICAL DATA:  Hypoxia EXAM: PORTABLE CHEST 1 VIEW COMPARISON:  11/09/2019, 11/08/2019, 11/07/2019 FINDINGS: Endotracheal tube tip is about 3.4 cm superior to the carina. Right upper extremity central venous catheter tip over the SVC. Esophageal tube tip below the diaphragm but incompletely visualized. Extensive bilateral airspace disease, may be becoming more confluent in  the mid to lower lung zones. Stable cardiomediastinal silhouette. No pneumothorax. IMPRESSION: Continued extensive bilateral airspace disease, possible worsening of disease in the mid to lower lung zones. Electronically Signed   By: Donavan Foil M.D.   On: 11/18/2019 01:09   DG CHEST PORT 1 VIEW  Result Date: 11/09/2019 CLINICAL DATA:  Hypoxia EXAM: PORTABLE CHEST 1 VIEW COMPARISON:  11/08/2019, 11/07/2019, 11/06/2019 FINDINGS: Endotracheal tube tip is about 3.6 cm superior to the carina. Right upper extremity  central venous catheter tip over the SVC. Esophageal tube tip is below the diaphragm but not well demonstrated. Extensive bilateral pulmonary airspace disease without significant change. Stable cardiomediastinal silhouette with aortic atherosclerosis. No pneumothorax IMPRESSION: 1. No significant interval change in extensive bilateral pulmonary airspace disease. 2. Endotracheal tube tip about 3.6 cm superior to the carina. Electronically Signed   By: Donavan Foil M.D.   On: 11/09/2019 20:57   DG CHEST PORT 1 VIEW  Result Date: 11/08/2019 CLINICAL DATA:  Shortness of breath, diagnosed as COVID-19 positive on 10/02/2019 EXAM: PORTABLE CHEST 1 VIEW COMPARISON:  Portable exam 1514 hours compared to 11/07/2019 FINDINGS: Tip of endotracheal tube projects 5.0 cm above carina. Feeding tube extends into stomach. RIGHT arm PICC line tip projects over SVC. Upper normal size of cardiac silhouette. Atherosclerotic calcification aorta. Patchy BILATERAL airspace infiltrates consistent with multifocal pneumonia, unchanged. No pleural effusion or pneumothorax. IMPRESSION: Persistent BILATERAL pulmonary infiltrates consistent with multifocal pneumonia. No significant interval change. Electronically Signed   By: Lavonia Dana M.D.   On: 11/08/2019 15:33   DG CHEST PORT 1 VIEW  Result Date: 11/07/2019 CLINICAL DATA:  Intubated EXAM: PORTABLE CHEST 1 VIEW COMPARISON:  11/07/2019, 11/06/2019, 11/03/2019 FINDINGS:  Endotracheal tube tip is about a cm superior to the carina. Esophageal tube tip is below the diaphragm. Right upper extremity central venous catheter tip over the distal SVC. Slight interval worsening of left-sided consolidations. Pulmonary infiltrates are otherwise unchanged. Stable cardiomediastinal silhouette. IMPRESSION: 1. Endotracheal tube tip about a cm superior to the carina. Esophageal tube tip below the diaphragm. 2. Slight interval worsening of left-sided consolidations. Electronically Signed   By: Donavan Foil M.D.   On: 11/07/2019 17:55   DG CHEST PORT 1 VIEW  Result Date: 11/06/2019 CLINICAL DATA:  Endotracheal intubation, COVID EXAM: PORTABLE CHEST 1 VIEW COMPARISON:  11/03/2019 FINDINGS: Unchanged support apparatus including endotracheal tube, enteric feeding tube, and right upper extremity PICC. Heart and mediastinum are unremarkable. Slight interval worsening of diffuse bilateral interstitial heterogeneous airspace opacity. IMPRESSION: 1. Slight interval worsening of diffuse bilateral interstitial and heterogeneous airspace opacity, consistent with worsened multifocal infection, edema, and/or ARDS in the setting of COVID. 2. Unchanged support apparatus including endotracheal tube, enteric feeding tube, and right upper extremity PICC. Electronically Signed   By: Eddie Candle M.D.   On: 11/06/2019 09:23   DG CHEST PORT 1 VIEW  Result Date: 11/03/2019 CLINICAL DATA:  Respiratory failure EXAM: PORTABLE CHEST 1 VIEW COMPARISON:  Two days ago FINDINGS: Endotracheal tube with tip just below the clavicular heads. The feeding tube reaches the stomach at least. Right PICC with tip at the upper cavoatrial junction. Patchy bilateral pneumonia that is extensive. Stable heart size. No visible effusion or pneumothorax IMPRESSION: Stable hardware positioning and extensive airspace disease. Electronically Signed   By: Monte Fantasia M.D.   On: 11/03/2019 07:22   DG Chest Port 1 View  Result Date:  11/01/2019 CLINICAL DATA:  COVID positive EXAM: PORTABLE CHEST 1 VIEW COMPARISON:  10/31/2019 FINDINGS: Endotracheal tube terminates 5 cm above the carina. Multifocal patchy opacities in the lungs bilaterally, lower lobe predominant, grossly unchanged. No pleural effusion or pneumothorax. Right arm PICC terminates the cavoatrial junction. The heart is normal in size. Enteric tube courses into the stomach. IMPRESSION: Endotracheal tube terminates 5 cm above the carina. Additional support apparatus as above. Multifocal pneumonia in this patient with known COVID, grossly unchanged. Electronically Signed   By: Julian Hy M.D.   On: 11/01/2019 08:00   DG  Chest Port 1 View  Result Date: 10/31/2019 CLINICAL DATA:  Respiratory failure. EXAM: PORTABLE CHEST 1 VIEW COMPARISON:  10/28/2019. FINDINGS: Endotracheal tube has been slightly withdrawn, its tip is in good anatomic position 3 cm above the carina. Feeding tube and right PICC line in stable position. Heart size stable. Low lung volumes. Diffuse bilateral pulmonary infiltrates are again noted. Slight improvement and right upper lobe infiltrate. No pleural effusion or pneumothorax. IMPRESSION: 1. Endotracheal tube has been slightly withdrawn, its tip is in good anatomic position 3 cm above the carina. Feeding tube and PICC line in stable position. 2. Low lung volumes. Diffuse bilateral pulmonary infiltrates are again noted. Slight improvement in right upper lobe infiltrate noted on today's exam. Electronically Signed   By: Marcello Moores  Register   On: 10/31/2019 06:49   DG Chest Port 1 View  Result Date: 10/28/2019 CLINICAL DATA:  Respiratory failure.  COVID. EXAM: PORTABLE CHEST 1 VIEW COMPARISON:  10/22/2019. FINDINGS: Endotracheal tube tip 6 mm above the portion of the carina. Proximal repositioning of 2 cm suggested. Interim removal of NG tube and placement of feeding tube, its tip is below left hemidiaphragm. Right PICC line stable position heart size stable.  Diffuse bilateral pulmonary infiltrates again noted. Improved aeration from prior exam. No pleural effusion or pneumothorax. IMPRESSION: 1. Endotracheal tube tip 6 mm above the portion of the carina. Proximal repositioning of 2 cm suggested. 2. Interim removal of NG tube and placement of feeding tube, its tip is below left hemidiaphragm. Right PICC line stable position. 3. Diffuse bilateral pulmonary infiltrates again noted. Improved aeration from prior exam. These results will be called to the ordering clinician or representative by the Radiologist Assistant, and communication documented in the PACS or [employer]. Electronically Signed   By: Marcello Moores  Register   On: 10/28/2019 05:48   DG CHEST PORT 1 VIEW  Result Date: 10/22/2019 CLINICAL DATA:  Hypoxia fever EXAM: PORTABLE CHEST 1 VIEW COMPARISON:  October 21, 2019 FINDINGS: Endotracheal tube tip is 1.7 cm above the carina. Nasogastric tube tip and side port are in the stomach. Central catheter tip is in the superior vena cava. No pneumothorax. : There is airspace opacity bilaterally with overall increase in consolidation in the right mid and lower lung regions. A lesser degree of more ill-defined opacity is noted in the left mid and lower lung regions. There is patchy airspace opacity in the right upper lobe, increased from 1 day prior. Heart size and pulmonary vascularity are normal. No adenopathy. There is aortic atherosclerosis. No bone lesions. IMPRESSION: Increase in airspace opacity bilaterally compared to 1 day prior. Tube and catheter positions as described without pneumothorax. Stable cardiac silhouette. Electronically Signed   By: Lowella Grip III M.D.   On: 10/22/2019 07:56   DG CHEST PORT 1 VIEW  Result Date: 10/21/2019 CLINICAL DATA:  Short of breath EXAM: PORTABLE CHEST 1 VIEW COMPARISON:  10/20/2019 FINDINGS: Endotracheal tube, NG tube, and PICC line unchanged. Stable enlarged cardiac silhouette. There is bilateral patchy airspace  disease which is slightly increased in density in the RIGHT lung. LEFT lung less well evaluated due to patient rotation. IMPRESSION: 1. Stable support apparatus. 2. Increased patchy airspace disease. Electronically Signed   By: Suzy Bouchard M.D.   On: 10/21/2019 08:09   DG Chest Port 1 View  Result Date: 10/20/2019 CLINICAL DATA:  69 year old female with respiratory failure, hypoxia. COVID-19. EXAM: PORTABLE CHEST 1 VIEW COMPARISON:  Portable chest 10/12/2019 and earlier. FINDINGS: Portable AP semi upright view  at 0518 hours. Stable endotracheal tube and enteric tube. New right upper extremity PICC line in place, tip at the cavoatrial junction level. Larger lung volumes, and regressed bilateral coarse and indistinct pulmonary opacity. Most confluent residual opacity is at both lung bases and slightly greater on the left. Mediastinal contours remain normal. No pneumothorax. No pleural effusion. No areas of worsening ventilation. IMPRESSION: 1. New right upper extremity PICC line, tip at the cavoatrial junction level. 2.  Otherwise stable lines and tubes. 3. Regressed COVID-19 pneumonia. Residual basilar predominant opacity. Electronically Signed   By: Genevie Ann M.D.   On: 10/20/2019 08:10   DG Chest Portable 1 View  Result Date: 10/16/2019 CLINICAL DATA:  COVID intubated EXAM: PORTABLE CHEST 1 VIEW COMPARISON:  10/14/2019 FINDINGS: Endotracheal tube tip is about 4.6 cm superior to the carina. Esophageal tube tip in the left upper quadrant. Extensive bilateral ground-glass opacities and consolidations, worse as compared with 10/14/2019. Stable cardiomediastinal silhouette with aortic atherosclerosis. No pneumothorax. IMPRESSION: 1. Endotracheal tube tip about 4.6 cm superior to the carina. 2. Worsening bilateral ground-glass opacities and consolidations consistent with pneumonia Electronically Signed   By: Donavan Foil M.D.   On: 10/21/2019 21:26   DG Abd Portable 1V  Result Date: 10/31/2019 CLINICAL  DATA:  Feeding tube placement EXAM: PORTABLE ABDOMEN - 1 VIEW COMPARISON:  10/29/2019 FINDINGS: Feeding tube tip in the fourth portion the duodenum which has advanced since the prior study. Normal bowel gas pattern. Lumbar fusion hardware IMPRESSION: Feeding tube advanced to the fourth portion the duodenum. Electronically Signed   By: Franchot Gallo M.D.   On: 10/31/2019 13:21   DG Abd Portable 1V  Result Date: 10/18/2019 CLINICAL DATA:  Enteric catheter placement EXAM: PORTABLE ABDOMEN - 1 VIEW COMPARISON:  10/18/2019 FINDINGS: Frontal view of the lower chest and upper abdomen demonstrates enteric catheter coiled over the gastric body, tip projecting over the gastric fundus. Continued diffuse bilateral airspace disease. Bowel gas pattern is unremarkable. IMPRESSION: 1. Enteric catheter overlying the gastric fundus. Electronically Signed   By: Randa Ngo M.D.   On: 10/18/2019 18:13   DG Abd Portable 1V  Result Date: 10/18/2019 CLINICAL DATA:  Enteric catheter placement EXAM: PORTABLE ABDOMEN - 1 VIEW COMPARISON:  10/06/2019 FINDINGS: Supine frontal view of the lower chest and upper abdomen was performed. No enteric catheter is seen on this exam. Bowel gas pattern is unremarkable. There is widespread bilateral airspace disease. IMPRESSION: 1. Enteric catheter is not identified on this exam. 2. Bibasilar pneumonia. Electronically Signed   By: Randa Ngo M.D.   On: 10/18/2019 17:43   VAS [place] LOWER EXTREMITY VENOUS (DVT)  Result Date: 10/23/2019  Lower Venous DVT Study Indications: Covid-19.  Comparison Study: No prior study Performing Technologist: Sharion Dove RVS  Examination Guidelines: A complete evaluation includes B-mode imaging, spectral Doppler, color Doppler, and power Doppler as needed of all accessible portions of each vessel. Bilateral testing is considered an integral part of a complete examination. Limited examinations for reoccurring indications may be performed as noted. The reflux  portion of the exam is performed with the patient in reverse Trendelenburg.  +---------+---------------+---------+-----------+----------+--------------+ RIGHT    CompressibilityPhasicitySpontaneityPropertiesThrombus Aging +---------+---------------+---------+-----------+----------+--------------+ CFV      Full           Yes      Yes                                 +---------+---------------+---------+-----------+----------+--------------+  SFJ      Full                                                        +---------+---------------+---------+-----------+----------+--------------+ FV Prox  Full                                                        +---------+---------------+---------+-----------+----------+--------------+ FV Mid   Full                                                        +---------+---------------+---------+-----------+----------+--------------+ FV DistalFull                                                        +---------+---------------+---------+-----------+----------+--------------+ PFV      Full                                                        +---------+---------------+---------+-----------+----------+--------------+ POP      Full           Yes      Yes                                 +---------+---------------+---------+-----------+----------+--------------+ PTV      Full                                                        +---------+---------------+---------+-----------+----------+--------------+ PERO     Full                                                        +---------+---------------+---------+-----------+----------+--------------+   +---------+---------------+---------+-----------+----------+--------------+ LEFT     CompressibilityPhasicitySpontaneityPropertiesThrombus Aging +---------+---------------+---------+-----------+----------+--------------+ CFV      Full           Yes      Yes                                  +---------+---------------+---------+-----------+----------+--------------+ SFJ      Full                                                        +---------+---------------+---------+-----------+----------+--------------+  FV Prox  Full                                                        +---------+---------------+---------+-----------+----------+--------------+ FV Mid   Full                                                        +---------+---------------+---------+-----------+----------+--------------+ FV DistalFull                                                        +---------+---------------+---------+-----------+----------+--------------+ PFV      Full                                                        +---------+---------------+---------+-----------+----------+--------------+ POP      Full           Yes      Yes                                 +---------+---------------+---------+-----------+----------+--------------+ PTV      Full                                                        +---------+---------------+---------+-----------+----------+--------------+ PERO     Full                                                        +---------+---------------+---------+-----------+----------+--------------+     Summary: BILATERAL: - No evidence of deep vein thrombosis seen in the lower extremities, bilaterally. -   *See table(s) above for measurements and observations. Electronically signed by Monica Martinez MD on 10/23/2019 at 3:20:46 PM.    Final    ECHOCARDIOGRAM LIMITED  Result Date: 10/24/2019    ECHOCARDIOGRAM LIMITED REPORT   Patient Name:   DOROTA HEINRICHS Date of Exam: 10/24/2019 Medical Rec #:  [id number]          Height:       64.0 in Accession #:    [id number]         Weight:       227.5 lb Date of Birth:  05/09/1950          BSA:          2.067 m Patient Age:    69 years           BP:           121/54  mmHg Patient  Gender: F                  HR:           71 bpm. Exam Location:  Inpatient Procedure: Limited Echo, Limited Color Doppler and Cardiac Doppler Indications:    I26.02 Pulmonary embolus  History:        Patient has no prior history of Echocardiogram examinations.                 CAD, Signs/Symptoms:Chest Pain; Risk Factors:Hypertension,                 Dyslipidemia and Non-Smoker. GERD.  Sonographer:    Vickie Epley RDCS Referring Phys: [id number] BRADLEY L ICARD  Sonographer Comments: Covid positive. IMPRESSIONS  1. Left ventricular ejection fraction, by estimation, is 65 to 70%. The left ventricle has normal function. The left ventricle has no regional wall motion abnormalities. Left ventricular diastolic parameters are consistent with Grade I diastolic dysfunction (impaired relaxation).  2. Right ventricular systolic function is normal. The right ventricular size is normal.  3. The mitral valve is normal in structure. No evidence of mitral valve regurgitation. No evidence of mitral stenosis.  4. The aortic valve is normal in structure. Aortic valve regurgitation is not visualized. No aortic stenosis is present.  5. The inferior vena cava is normal in size with greater than 50% respiratory variability, suggesting right atrial pressure of 3 mmHg. FINDINGS  Left Ventricle: Left ventricular ejection fraction, by estimation, is 65 to 70%. The left ventricle has normal function. The left ventricle has no regional wall motion abnormalities. The left ventricular internal cavity size was normal in size. There is  no left ventricular hypertrophy. Right Ventricle: The right ventricular size is normal. No increase in right ventricular wall thickness. Right ventricular systolic function is normal. Left Atrium: Left atrial size was normal in size. Right Atrium: Right atrial size was normal in size. Pericardium: There is no evidence of pericardial effusion. Mitral Valve: The mitral valve is normal in structure. Normal  mobility of the mitral valve leaflets. Mild mitral annular calcification. No evidence of mitral valve stenosis. Tricuspid Valve: The tricuspid valve is normal in structure. Tricuspid valve regurgitation is not demonstrated. No evidence of tricuspid stenosis. Aortic Valve: The aortic valve is normal in structure. Aortic valve regurgitation is not visualized. No aortic stenosis is present. Pulmonic Valve: The pulmonic valve was normal in structure. Pulmonic valve regurgitation is not visualized. No evidence of pulmonic stenosis. Aorta: The aortic root is normal in size and structure. Venous: The inferior vena cava is normal in size with greater than 50% respiratory variability, suggesting right atrial pressure of 3 mmHg. IAS/Shunts: No atrial level shunt detected by color flow Doppler. LEFT VENTRICLE PLAX 2D LVIDd:         5.00 cm     Diastology LVIDs:         4.00 cm     LV e' lateral:   6.31 cm/s LV PW:         0.70 cm     LV E/e' lateral: 12.6 LV IVS:        0.70 cm     LV e' medial:    5.87 cm/s LVOT diam:     2.10 cm     LV E/e' medial:  13.6 LV SV:         91 LV SV Index:   44 LVOT Area:     3.46 cm  LV Volumes (MOD)  LV vol d, MOD A2C: 72.3 ml LV vol d, MOD A4C: 69.0 ml LV vol s, MOD A2C: 20.1 ml LV vol s, MOD A4C: 27.6 ml LV SV MOD A2C:     52.2 ml LV SV MOD A4C:     69.0 ml LV SV MOD BP:      48.2 ml RIGHT VENTRICLE RV S prime:     13.60 cm/s TAPSE (M-mode): 1.8 cm LEFT ATRIUM         Index LA diam:    4.10 cm 1.98 cm/m  AORTIC VALVE LVOT Vmax:   99.20 cm/s LVOT Vmean:  62.700 cm/s LVOT VTI:    0.264 m  AORTA Ao Root diam: 3.10 cm MITRAL VALVE MV Area (PHT): 2.42 cm    SHUNTS MV Decel Time: 313 msec    Systemic VTI:  0.26 m MV E velocity: 79.70 cm/s  Systemic Diam: 2.10 cm MV A velocity: 79.30 cm/s MV E/A ratio:  1.01 Candee Furbish MD Electronically signed by Candee Furbish MD Signature Date/Time: 10/24/2019/11:46:14 AM    Final    [place] EKG SITE RITE  Result Date: 10/19/2019 If Site Rite image not attached,  placement could not be confirmed due to current cardiac rhythm.   Microbiology Recent Results (from the past 240 hour(s))  Culture, respiratory (non-expectorated)     Status: None   Collection Time: 11/07/19  9:52 PM   Specimen: Tracheal Aspirate; Respiratory  Result Value Ref Range Status   Specimen Description TRACHEAL ASPIRATE  Final   Special Requests NONE  Final   Gram Stain   Final    FEW WBC PRESENT, PREDOMINANTLY PMN RARE GRAM POSITIVE COCCI Performed at [facility], [place] [address] [place], [place] [address]    Culture FEW METHICILLIN RESISTANT STAPHYLOCOCCUS AUREUS  Final   Report Status 11/10/2019 FINAL  Final   Organism ID, Bacteria METHICILLIN RESISTANT STAPHYLOCOCCUS AUREUS  Final      Susceptibility   Methicillin resistant staphylococcus aureus - MIC*    CIPROFLOXACIN <=0.5 SENSITIVE Sensitive     ERYTHROMYCIN >=8 RESISTANT Resistant     GENTAMICIN <=0.5 SENSITIVE Sensitive     OXACILLIN >=4 RESISTANT Resistant     TETRACYCLINE <=1 SENSITIVE Sensitive     VANCOMYCIN <=0.5 SENSITIVE Sensitive     TRIMETH/SULFA <=10 SENSITIVE Sensitive     CLINDAMYCIN >=8 RESISTANT Resistant     RIFAMPIN <=0.5 SENSITIVE Sensitive     Inducible Clindamycin NEGATIVE Sensitive     * FEW METHICILLIN RESISTANT STAPHYLOCOCCUS AUREUS  Culture, blood (routine x 2)     Status: None   Collection Time: 11/09/19 11:52 AM   Specimen: BLOOD  Result Value Ref Range Status   Specimen Description BLOOD LEFT ANTECUBITAL  Final   Special Requests   Final    BOTTLES DRAWN AEROBIC AND ANAEROBIC Blood Culture results may not be optimal due to an inadequate volume of blood received in culture bottles   Culture   Final    NO GROWTH 5 DAYS Performed at [facility], [place] [address], [place], [place] [address]    Report Status 11/14/2019 FINAL  Final  Culture, blood (routine x 2)     Status: None   Collection Time: 11/09/19 11:56 AM   Specimen: BLOOD LEFT HAND  Result Value Ref Range  Status   Specimen Description BLOOD LEFT HAND  Final   Special Requests   Final    BOTTLES DRAWN AEROBIC ONLY Blood Culture results may not be optimal due  to an inadequate volume of blood received in culture bottles   Culture   Final    NO GROWTH 5 DAYS Performed at [facility], [place] [address] [place], [place] [address]    Report Status 11/14/2019 FINAL  Final    Lab Basic Metabolic Panel: Recent Labs  Lab 11/08/19 2326 11/08/19 2326 11/09/19 0528 11/09/19 0528 11/10/19 0334 11/10/19 1134 11/19/2019 0423 11/13/2019 0526 11/18/2019 1100 11/18/2019 0328 11-18-19 0412  NA 140   < > 141   < > 140   < > 140 140 139 139 139  K 4.6   < > 4.6   < > 5.5*   < > 5.5* 5.8* 5.1 5.3* 5.0  CL 102   < > 102  --  103  --   --  101 102  --  100  CO2 30   < > 30  --  31  --   --  30 28  --  30  GLUCOSE 229*   < > 180*  --  219*  --   --  117* 182*  --  95  BUN 19   < > 20  --  24*  --   --  31* 32*  --  30*  CREATININE 0.56   < > 0.57  --  0.62  --   --  0.66 0.67  --  0.57  CALCIUM 8.6*   < > 8.6*  --  8.4*  --   --  8.8* 8.4*  --  8.8*  MG 1.9  --   --   --   --   --   --   --   --   --   --    < > = values in this interval not displayed.   Liver Function Tests: No results for input(s): AST, ALT, ALKPHOS, BILITOT, PROT, ALBUMIN in the last 168 hours. No results for input(s): LIPASE, AMYLASE in the last 168 hours. No results for input(s): AMMONIA in the last 168 hours. CBC: Recent Labs  Lab 11/10/19 0334 11/10/19 1134 11/27/2019 0038 10/30/2019 0423 11/17/2019 0526 November 18, 2019 0328 11-18-19 0412  WBC 10.3  --   --   --  14.0*  --  12.5*  NEUTROABS 8.1*  --   --   --   --   --   --   HGB 7.2*   < > 9.5* 8.8* 8.5* 7.8* 7.7*  HCT 24.4*   < > 28.0* 26.0* 28.1* 23.0* 25.0*  MCV 104.3*  --   --   --  100.4*  --  98.8  PLT 240  --   --   --  291  --  271   < > = values in this interval not displayed.   Cardiac Enzymes: No results for input(s): CKTOTAL, CKMB, CKMBINDEX, TROPONINI in the last  168 hours. Sepsis Labs: Recent Labs  Lab 11/10/19 0334 11/19/2019 0526 18-Nov-2019 0412  WBC 10.3 14.0* 12.5*   Gumecindo Hopkin A Donnelle Olmeda 11/15/2019, 5:18 AM

## 2019-11-28 DEATH — deceased

## 2019-12-23 ENCOUNTER — Ambulatory Visit: Payer: Medicare Other | Admitting: Family Medicine

## 2019-12-24 ENCOUNTER — Other Ambulatory Visit: Payer: Medicare Other

## 2019-12-26 ENCOUNTER — Telehealth: Payer: Medicare Other | Admitting: Oncology

## 2020-03-23 ENCOUNTER — Ambulatory Visit: Payer: Medicare Other | Admitting: Physician Assistant

## 2020-09-28 ENCOUNTER — Ambulatory Visit: Payer: Medicare Other
